# Patient Record
Sex: Male | Born: 1950 | Race: White | Hispanic: No | Marital: Married | Smoking: Never smoker
Health system: Southern US, Community
[De-identification: ages and names within clinical notes are randomized; demographics above are authoritative.]

## PROBLEM LIST (undated history)

## (undated) DIAGNOSIS — C439 Malignant melanoma of skin, unspecified: Secondary | ICD-10-CM

## (undated) DIAGNOSIS — I493 Ventricular premature depolarization: Secondary | ICD-10-CM

## (undated) DIAGNOSIS — I10 Essential (primary) hypertension: Secondary | ICD-10-CM

## (undated) DIAGNOSIS — G4733 Obstructive sleep apnea (adult) (pediatric): Secondary | ICD-10-CM

## (undated) DIAGNOSIS — K219 Gastro-esophageal reflux disease without esophagitis: Secondary | ICD-10-CM

## (undated) DIAGNOSIS — I712 Thoracic aortic aneurysm, without rupture: Secondary | ICD-10-CM

## (undated) DIAGNOSIS — R519 Headache, unspecified: Secondary | ICD-10-CM

## (undated) DIAGNOSIS — K631 Perforation of intestine (nontraumatic): Secondary | ICD-10-CM

## (undated) DIAGNOSIS — C189 Malignant neoplasm of colon, unspecified: Secondary | ICD-10-CM

## (undated) DIAGNOSIS — G4730 Sleep apnea, unspecified: Secondary | ICD-10-CM

## (undated) DIAGNOSIS — R51 Headache: Secondary | ICD-10-CM

## (undated) DIAGNOSIS — Z8489 Family history of other specified conditions: Secondary | ICD-10-CM

## (undated) DIAGNOSIS — H9319 Tinnitus, unspecified ear: Secondary | ICD-10-CM

## (undated) DIAGNOSIS — Z973 Presence of spectacles and contact lenses: Secondary | ICD-10-CM

## (undated) DIAGNOSIS — R011 Cardiac murmur, unspecified: Secondary | ICD-10-CM

## (undated) DIAGNOSIS — I480 Paroxysmal atrial fibrillation: Secondary | ICD-10-CM

## (undated) DIAGNOSIS — Z9989 Dependence on other enabling machines and devices: Secondary | ICD-10-CM

## (undated) DIAGNOSIS — I350 Nonrheumatic aortic (valve) stenosis: Secondary | ICD-10-CM

## (undated) DIAGNOSIS — E785 Hyperlipidemia, unspecified: Secondary | ICD-10-CM

## (undated) HISTORY — DX: Dependence on other enabling machines and devices: Z99.89

## (undated) HISTORY — DX: Thoracic aortic aneurysm, without rupture: I71.2

## (undated) HISTORY — DX: Hyperlipidemia, unspecified: E78.5

## (undated) HISTORY — DX: Ventricular premature depolarization: I49.3

## (undated) HISTORY — PX: MELANOMA EXCISION: SHX5266

## (undated) HISTORY — DX: Sleep apnea, unspecified: G47.30

## (undated) HISTORY — DX: Obstructive sleep apnea (adult) (pediatric): G47.33

---

## 2016-04-25 ENCOUNTER — Emergency Department (HOSPITAL_COMMUNITY): Payer: Medicare Other | Admitting: Anesthesiology

## 2016-04-25 ENCOUNTER — Inpatient Hospital Stay (HOSPITAL_COMMUNITY): Payer: Medicare Other

## 2016-04-25 ENCOUNTER — Inpatient Hospital Stay (HOSPITAL_COMMUNITY)
Admission: EM | Admit: 2016-04-25 | Discharge: 2016-05-03 | DRG: 329 | Disposition: A | Discharge status: Home Health | Payer: Medicare Other | Attending: Surgery | Admitting: Surgery

## 2016-04-25 ENCOUNTER — Emergency Department (HOSPITAL_COMMUNITY): Payer: Medicare Other

## 2016-04-25 ENCOUNTER — Encounter (HOSPITAL_COMMUNITY): Admission: EM | Disposition: A | Discharge status: Home Health | Payer: Self-pay | Source: Home / Self Care

## 2016-04-25 ENCOUNTER — Encounter (HOSPITAL_COMMUNITY): Payer: Self-pay

## 2016-04-25 DIAGNOSIS — R918 Other nonspecific abnormal finding of lung field: Secondary | ICD-10-CM | POA: Diagnosis not present

## 2016-04-25 DIAGNOSIS — K658 Other peritonitis: Secondary | ICD-10-CM | POA: Diagnosis not present

## 2016-04-25 DIAGNOSIS — I10 Essential (primary) hypertension: Secondary | ICD-10-CM | POA: Diagnosis not present

## 2016-04-25 DIAGNOSIS — Z79899 Other long term (current) drug therapy: Secondary | ICD-10-CM | POA: Diagnosis not present

## 2016-04-25 DIAGNOSIS — R198 Other specified symptoms and signs involving the digestive system and abdomen: Secondary | ICD-10-CM | POA: Diagnosis present

## 2016-04-25 DIAGNOSIS — N990 Postprocedural (acute) (chronic) kidney failure: Secondary | ICD-10-CM | POA: Diagnosis not present

## 2016-04-25 DIAGNOSIS — R739 Hyperglycemia, unspecified: Secondary | ICD-10-CM | POA: Diagnosis not present

## 2016-04-25 DIAGNOSIS — K631 Perforation of intestine (nontraumatic): Principal | ICD-10-CM | POA: Diagnosis present

## 2016-04-25 DIAGNOSIS — Z4682 Encounter for fitting and adjustment of non-vascular catheter: Secondary | ICD-10-CM | POA: Diagnosis not present

## 2016-04-25 DIAGNOSIS — Z85820 Personal history of malignant melanoma of skin: Secondary | ICD-10-CM

## 2016-04-25 DIAGNOSIS — R69 Illness, unspecified: Secondary | ICD-10-CM | POA: Diagnosis not present

## 2016-04-25 DIAGNOSIS — K420 Umbilical hernia with obstruction, without gangrene: Secondary | ICD-10-CM

## 2016-04-25 DIAGNOSIS — Z85828 Personal history of other malignant neoplasm of skin: Secondary | ICD-10-CM

## 2016-04-25 DIAGNOSIS — K633 Ulcer of intestine: Secondary | ICD-10-CM

## 2016-04-25 DIAGNOSIS — K429 Umbilical hernia without obstruction or gangrene: Secondary | ICD-10-CM | POA: Diagnosis not present

## 2016-04-25 DIAGNOSIS — Z01818 Encounter for other preprocedural examination: Secondary | ICD-10-CM

## 2016-04-25 DIAGNOSIS — J9601 Acute respiratory failure with hypoxia: Secondary | ICD-10-CM | POA: Diagnosis not present

## 2016-04-25 DIAGNOSIS — K5909 Other constipation: Secondary | ICD-10-CM | POA: Diagnosis present

## 2016-04-25 DIAGNOSIS — K659 Peritonitis, unspecified: Secondary | ICD-10-CM | POA: Diagnosis not present

## 2016-04-25 DIAGNOSIS — K5900 Constipation, unspecified: Secondary | ICD-10-CM | POA: Diagnosis not present

## 2016-04-25 DIAGNOSIS — R069 Unspecified abnormalities of breathing: Secondary | ICD-10-CM

## 2016-04-25 HISTORY — DX: Essential (primary) hypertension: I10

## 2016-04-25 HISTORY — DX: Malignant melanoma of skin, unspecified: C43.9

## 2016-04-25 HISTORY — PX: LAPAROTOMY: SHX154

## 2016-04-25 LAB — BLOOD GAS, ARTERIAL
ACID-BASE DEFICIT: 1.5 mmol/L (ref 0.0–2.0)
BICARBONATE: 23.9 meq/L (ref 20.0–24.0)
Drawn by: 11249
FIO2: 0.4
LHR: 14 {breaths}/min
O2 SAT: 97.6 %
PATIENT TEMPERATURE: 99.6
PCO2 ART: 46 mmHg — AB (ref 35.0–45.0)
PEEP/CPAP: 5 cmH2O
PH ART: 7.339 — AB (ref 7.350–7.450)
PO2 ART: 113 mmHg — AB (ref 80.0–100.0)
TCO2: 20.6 mmol/L (ref 0–100)
VT: 500 mL

## 2016-04-25 LAB — COMPREHENSIVE METABOLIC PANEL
ALT: 30 U/L (ref 17–63)
ANION GAP: 10 (ref 5–15)
AST: 27 U/L (ref 15–41)
Albumin: 4.8 g/dL (ref 3.5–5.0)
Alkaline Phosphatase: 50 U/L (ref 38–126)
BUN: 15 mg/dL (ref 6–20)
CHLORIDE: 99 mmol/L — AB (ref 101–111)
CO2: 26 mmol/L (ref 22–32)
Calcium: 9.2 mg/dL (ref 8.9–10.3)
Creatinine, Ser: 1.1 mg/dL (ref 0.61–1.24)
Glucose, Bld: 129 mg/dL — ABNORMAL HIGH (ref 65–99)
POTASSIUM: 4.1 mmol/L (ref 3.5–5.1)
Sodium: 135 mmol/L (ref 135–145)
Total Bilirubin: 1.4 mg/dL — ABNORMAL HIGH (ref 0.3–1.2)
Total Protein: 7.7 g/dL (ref 6.5–8.1)

## 2016-04-25 LAB — TYPE AND SCREEN
ABO/RH(D): B POS
ANTIBODY SCREEN: NEGATIVE

## 2016-04-25 LAB — PROTIME-INR
INR: 1.16
Prothrombin Time: 14.9 seconds (ref 11.4–15.2)

## 2016-04-25 LAB — ABO/RH: ABO/RH(D): B POS

## 2016-04-25 LAB — CBC
HEMATOCRIT: 48.3 % (ref 39.0–52.0)
HEMOGLOBIN: 16.7 g/dL (ref 13.0–17.0)
MCH: 33 pg (ref 26.0–34.0)
MCHC: 34.6 g/dL (ref 30.0–36.0)
MCV: 95.5 fL (ref 78.0–100.0)
PLATELETS: 168 10*3/uL (ref 150–400)
RBC: 5.06 MIL/uL (ref 4.22–5.81)
RDW: 13.2 % (ref 11.5–15.5)
WBC: 14.5 10*3/uL — AB (ref 4.0–10.5)

## 2016-04-25 LAB — LIPASE, BLOOD: LIPASE: 16 U/L (ref 11–51)

## 2016-04-25 SURGERY — LAPAROTOMY, EXPLORATORY
Anesthesia: General | Site: Abdomen

## 2016-04-25 MED ORDER — FAMOTIDINE IN NACL 20-0.9 MG/50ML-% IV SOLN
20.0000 mg | Freq: Two times a day (BID) | INTRAVENOUS | Status: DC
  Administered 2016-04-26 – 2016-04-30 (×11): 20 mg via INTRAVENOUS
  Filled 2016-04-25 (×12): qty 50

## 2016-04-25 MED ORDER — DEXAMETHASONE SODIUM PHOSPHATE 10 MG/ML IJ SOLN
INTRAMUSCULAR | Status: DC | PRN
  Administered 2016-04-25: 10 mg via INTRAVENOUS

## 2016-04-25 MED ORDER — PROPOFOL 10 MG/ML IV BOLUS
INTRAVENOUS | Status: AC
  Filled 2016-04-25: qty 40

## 2016-04-25 MED ORDER — PIPERACILLIN-TAZOBACTAM 3.375 G IVPB 30 MIN
3.3750 g | INTRAVENOUS | Status: AC
  Administered 2016-04-25: 3.375 g via INTRAVENOUS
  Filled 2016-04-25: qty 50

## 2016-04-25 MED ORDER — CHLORHEXIDINE GLUCONATE CLOTH 2 % EX PADS
6.0000 | MEDICATED_PAD | Freq: Once | CUTANEOUS | Status: AC
  Administered 2016-04-26: 6 via TOPICAL

## 2016-04-25 MED ORDER — FENTANYL CITRATE (PF) 250 MCG/5ML IJ SOLN
INTRAMUSCULAR | Status: AC
  Filled 2016-04-25: qty 5

## 2016-04-25 MED ORDER — SUFENTANIL CITRATE 50 MCG/ML IV SOLN
INTRAVENOUS | Status: AC
  Filled 2016-04-25: qty 1

## 2016-04-25 MED ORDER — HYDROMORPHONE HCL 1 MG/ML IJ SOLN: 0.2500 mg | INTRAMUSCULAR | Status: DC | PRN

## 2016-04-25 MED ORDER — IOPAMIDOL (ISOVUE-300) INJECTION 61%
100.0000 mL | Freq: Once | INTRAVENOUS | Status: AC | PRN
  Administered 2016-04-25: 100 mL via INTRAVENOUS

## 2016-04-25 MED ORDER — SUFENTANIL CITRATE 50 MCG/ML IV SOLN
INTRAVENOUS | Status: DC | PRN
  Administered 2016-04-25 (×2): 15 ug via INTRAVENOUS
  Administered 2016-04-25 (×2): 10 ug via INTRAVENOUS

## 2016-04-25 MED ORDER — PROPOFOL 10 MG/ML IV BOLUS
INTRAVENOUS | Status: DC | PRN
  Administered 2016-04-25: 200 mg via INTRAVENOUS

## 2016-04-25 MED ORDER — SODIUM CHLORIDE 0.9 % IJ SOLN
INTRAMUSCULAR | Status: AC
  Filled 2016-04-25: qty 10

## 2016-04-25 MED ORDER — 0.9 % SODIUM CHLORIDE (POUR BTL) OPTIME
TOPICAL | Status: DC | PRN
  Administered 2016-04-25: 5000 mL

## 2016-04-25 MED ORDER — HYDROMORPHONE HCL 1 MG/ML IJ SOLN
INTRAMUSCULAR | Status: DC | PRN
  Administered 2016-04-25 (×2): 1 mg via INTRAVENOUS

## 2016-04-25 MED ORDER — LIDOCAINE HCL (CARDIAC) 20 MG/ML IV SOLN
INTRAVENOUS | Status: DC | PRN
  Administered 2016-04-25: 25 mg via INTRATRACHEAL
  Administered 2016-04-25: 75 mg via INTRAVENOUS

## 2016-04-25 MED ORDER — HYDROMORPHONE HCL 2 MG/ML IJ SOLN
INTRAMUSCULAR | Status: AC
  Filled 2016-04-25: qty 1

## 2016-04-25 MED ORDER — ONDANSETRON HCL 4 MG/2ML IJ SOLN
INTRAMUSCULAR | Status: DC | PRN
  Administered 2016-04-25: 4 mg via INTRAVENOUS

## 2016-04-25 MED ORDER — SODIUM CHLORIDE 0.9 % IV BOLUS (SEPSIS)
1000.0000 mL | Freq: Once | INTRAVENOUS | Status: AC
  Administered 2016-04-25: 1000 mL via INTRAVENOUS

## 2016-04-25 MED ORDER — ENOXAPARIN SODIUM 40 MG/0.4ML ~~LOC~~ SOLN
40.0000 mg | SUBCUTANEOUS | Status: DC
  Administered 2016-04-26 – 2016-05-02 (×6): 40 mg via SUBCUTANEOUS
  Filled 2016-04-25 (×7): qty 0.4

## 2016-04-25 MED ORDER — ONDANSETRON HCL 4 MG/2ML IJ SOLN: 4.0000 mg | Freq: Four times a day (QID) | INTRAMUSCULAR | Status: DC | PRN

## 2016-04-25 MED ORDER — ONDANSETRON HCL 4 MG/2ML IJ SOLN
INTRAMUSCULAR | Status: AC
  Filled 2016-04-25: qty 2

## 2016-04-25 MED ORDER — EPHEDRINE SULFATE 50 MG/ML IJ SOLN
INTRAMUSCULAR | Status: AC
  Filled 2016-04-25: qty 1

## 2016-04-25 MED ORDER — FENTANYL CITRATE (PF) 100 MCG/2ML IJ SOLN
75.0000 ug | Freq: Once | INTRAMUSCULAR | Status: AC
  Administered 2016-04-25: 75 ug via INTRAVENOUS

## 2016-04-25 MED ORDER — ROCURONIUM BROMIDE 100 MG/10ML IV SOLN
INTRAVENOUS | Status: DC | PRN
  Administered 2016-04-25: 20 mg via INTRAVENOUS
  Administered 2016-04-25 (×2): 30 mg via INTRAVENOUS
  Administered 2016-04-25: 50 mg via INTRAVENOUS

## 2016-04-25 MED ORDER — LIDOCAINE HCL 1 % IJ SOLN
INTRAMUSCULAR | Status: AC
  Filled 2016-04-25: qty 80

## 2016-04-25 MED ORDER — FENTANYL CITRATE (PF) 100 MCG/2ML IJ SOLN
INTRAMUSCULAR | Status: AC
Start: 2016-04-25 — End: 2016-04-26
  Filled 2016-04-25: qty 2

## 2016-04-25 MED ORDER — PIPERACILLIN-TAZOBACTAM 3.375 G IVPB: 3.3750 g | Freq: Three times a day (TID) | INTRAVENOUS | Status: DC

## 2016-04-25 MED ORDER — MIDAZOLAM HCL 5 MG/5ML IJ SOLN
INTRAMUSCULAR | Status: DC | PRN
  Administered 2016-04-25: 2 mg via INTRAVENOUS

## 2016-04-25 MED ORDER — SUCCINYLCHOLINE CHLORIDE 200 MG/10ML IV SOSY
PREFILLED_SYRINGE | INTRAVENOUS | Status: DC | PRN
  Administered 2016-04-25: 100 mg via INTRAVENOUS

## 2016-04-25 MED ORDER — KCL IN DEXTROSE-NACL 20-5-0.45 MEQ/L-%-% IV SOLN
INTRAVENOUS | Status: DC
  Administered 2016-04-26: 125 mL/h via INTRAVENOUS
  Administered 2016-04-26 – 2016-04-29 (×4): via INTRAVENOUS
  Filled 2016-04-25 (×8): qty 1000

## 2016-04-25 MED ORDER — HYDROMORPHONE HCL 1 MG/ML IJ SOLN
1.0000 mg | INTRAMUSCULAR | Status: DC | PRN
  Administered 2016-04-26: 1 mg via INTRAVENOUS
  Filled 2016-04-25: qty 1

## 2016-04-25 MED ORDER — MIDAZOLAM HCL 2 MG/2ML IJ SOLN
INTRAMUSCULAR | Status: AC
  Filled 2016-04-25: qty 2

## 2016-04-25 MED ORDER — LACTATED RINGERS IV SOLN
INTRAVENOUS | Status: DC | PRN
  Administered 2016-04-25 (×3): via INTRAVENOUS

## 2016-04-25 MED ORDER — FENTANYL CITRATE (PF) 100 MCG/2ML IJ SOLN
INTRAMUSCULAR | Status: DC | PRN
  Administered 2016-04-25: 50 ug via INTRAVENOUS
  Administered 2016-04-25: 100 ug via INTRAVENOUS
  Administered 2016-04-25: 50 ug via INTRAVENOUS
  Administered 2016-04-25: 100 ug via INTRAVENOUS
  Administered 2016-04-25: 150 ug via INTRAVENOUS
  Administered 2016-04-25: 50 ug via INTRAVENOUS

## 2016-04-25 MED ORDER — DEXAMETHASONE SODIUM PHOSPHATE 10 MG/ML IJ SOLN
INTRAMUSCULAR | Status: AC
  Filled 2016-04-25: qty 1

## 2016-04-25 MED ORDER — PROMETHAZINE HCL 25 MG/ML IJ SOLN: 6.2500 mg | INTRAMUSCULAR | Status: DC | PRN

## 2016-04-25 MED ORDER — ONDANSETRON 4 MG PO TBDP: 4.0000 mg | ORAL_TABLET | Freq: Four times a day (QID) | ORAL | Status: DC | PRN

## 2016-04-25 MED ORDER — PIPERACILLIN-TAZOBACTAM 3.375 G IVPB
3.3750 g | Freq: Three times a day (TID) | INTRAVENOUS | Status: DC
  Administered 2016-04-26 – 2016-05-01 (×18): 3.375 g via INTRAVENOUS
  Filled 2016-04-25 (×21): qty 50

## 2016-04-25 MED ORDER — FENTANYL CITRATE (PF) 100 MCG/2ML IJ SOLN
75.0000 ug | Freq: Once | INTRAMUSCULAR | Status: AC
  Administered 2016-04-25: 75 ug via INTRAVENOUS
  Filled 2016-04-25: qty 2

## 2016-04-25 MED ORDER — ROCURONIUM BROMIDE 100 MG/10ML IV SOLN
INTRAVENOUS | Status: AC
  Filled 2016-04-25: qty 1

## 2016-04-25 MED ORDER — LIDOCAINE HCL (CARDIAC) 20 MG/ML IV SOLN
INTRAVENOUS | Status: AC
  Filled 2016-04-25: qty 5

## 2016-04-25 SURGICAL SUPPLY — 47 items
APPLICATOR COTTON TIP 6IN STRL (MISCELLANEOUS) ×3 IMPLANT
BARRIER SKIN 2 3/4 (OSTOMY) ×2 IMPLANT
BARRIER SKIN 2 3/4 INCH (OSTOMY) ×1
BLADE EXTENDED COATED 6.5IN (ELECTRODE) IMPLANT
BLADE HEX COATED 2.75 (ELECTRODE) ×3 IMPLANT
CHLORAPREP W/TINT 26ML (MISCELLANEOUS) ×3 IMPLANT
COVER MAYO STAND STRL (DRAPES) IMPLANT
COVER SURGICAL LIGHT HANDLE (MISCELLANEOUS) IMPLANT
DRAPE LAPAROSCOPIC ABDOMINAL (DRAPES) ×3 IMPLANT
DRAPE WARM FLUID 44X44 (DRAPE) IMPLANT
ELECT REM PT RETURN 9FT ADLT (ELECTROSURGICAL) ×3
ELECTRODE REM PT RTRN 9FT ADLT (ELECTROSURGICAL) ×1 IMPLANT
GAUZE SPONGE 4X4 12PLY STRL (GAUZE/BANDAGES/DRESSINGS) ×3 IMPLANT
GLOVE BIOGEL PI IND STRL 7.0 (GLOVE) ×1 IMPLANT
GLOVE BIOGEL PI INDICATOR 7.0 (GLOVE) ×2
GLOVE SURG ORTHO 8.0 STRL STRW (GLOVE) ×6 IMPLANT
GOWN STRL REUS W/TWL LRG LVL3 (GOWN DISPOSABLE) ×3 IMPLANT
GOWN STRL REUS W/TWL XL LVL3 (GOWN DISPOSABLE) ×6 IMPLANT
HANDLE SUCTION POOLE (INSTRUMENTS) ×1 IMPLANT
KIT BASIN OR (CUSTOM PROCEDURE TRAY) ×3 IMPLANT
LIGASURE IMPACT 36 18CM CVD LR (INSTRUMENTS) ×3 IMPLANT
NS IRRIG 1000ML POUR BTL (IV SOLUTION) ×12 IMPLANT
PACK GENERAL/GYN (CUSTOM PROCEDURE TRAY) ×3 IMPLANT
PAD ABD 8X10 STRL (GAUZE/BANDAGES/DRESSINGS) ×3 IMPLANT
RELOAD PROXIMATE 75MM BLUE (ENDOMECHANICALS) ×3 IMPLANT
SPONGE LAP 18X18 X RAY DECT (DISPOSABLE) IMPLANT
STAPLER PROXIMATE 75MM BLUE (STAPLE) ×3 IMPLANT
STAPLER VISISTAT 35W (STAPLE) ×3 IMPLANT
SUCTION POOLE HANDLE (INSTRUMENTS) ×3
SUT NOV 1 T60/GS (SUTURE) IMPLANT
SUT NOVA NAB DX-16 0-1 5-0 T12 (SUTURE) ×12 IMPLANT
SUT PROLENE 2 0 SH DA (SUTURE) ×3 IMPLANT
SUT SILK 2 0 (SUTURE) ×2
SUT SILK 2 0 SH CR/8 (SUTURE) ×6 IMPLANT
SUT SILK 2-0 18XBRD TIE 12 (SUTURE) ×1 IMPLANT
SUT SILK 3 0 (SUTURE) ×2
SUT SILK 3 0 SH CR/8 (SUTURE) ×3 IMPLANT
SUT SILK 3-0 18XBRD TIE 12 (SUTURE) ×1 IMPLANT
SUT VIC AB 3-0 SH 18 (SUTURE) ×3 IMPLANT
SUT VICRYL 2 0 18  UND BR (SUTURE)
SUT VICRYL 2 0 18 UND BR (SUTURE) IMPLANT
TAPE CLOTH SURG 6X10 WHT LF (GAUZE/BANDAGES/DRESSINGS) ×3 IMPLANT
TOWEL OR 17X26 10 PK STRL BLUE (TOWEL DISPOSABLE) ×6 IMPLANT
TRAY FOLEY W/METER SILVER 14FR (SET/KITS/TRAYS/PACK) IMPLANT
TRAY FOLEY W/METER SILVER 16FR (SET/KITS/TRAYS/PACK) ×3 IMPLANT
WATER STERILE IRR 1500ML POUR (IV SOLUTION) IMPLANT
YANKAUER SUCT BULB TIP NO VENT (SUCTIONS) ×3 IMPLANT

## 2016-04-25 NOTE — ED Notes (Signed)
Transport arrived to take pt to surgery. Pt wiped down with Chloraprep, removed belongings and given to family. Consent signed and sent with pt.

## 2016-04-25 NOTE — Transfer of Care (Signed)
Immediate Anesthesia Transfer of Care Note  Patient: [name] Brown  Procedure(s) Performed: Procedure(s): EXPLORATORY LAPAROTOMY WITH SIGMOID COLECTOMY, COLOSTOMY (N/A)  Patient Location: PACU  Anesthesia Type:General  Level of Consciousness: sedated and Patient remains intubated per anesthesia plan  Airway & Oxygen Therapy: Patient placed on Ventilator (see vital sign flow sheet for setting)  Post-op Assessment: Report given to RN and Post -op Vital signs reviewed and stable  Post vital signs: stable  Last Vitals:  Vitals:   04/25/16 1727 04/25/16 2013  BP: 146/85 149/87  Pulse: 95 103  Resp: 14 12  Temp:      Last Pain:  Vitals:   04/25/16 2013  TempSrc:   PainSc: 5          Complications: No apparent anesthesia complications

## 2016-04-25 NOTE — Progress Notes (Signed)
Pharmacy Antibiotic Note  Dreydon Padelford is a 65 y.o. male admitted on 04/25/2016 with abdominal pain. Pharmacy has been consulted for Zosyn dosing. CrCl ~63 ml/min.  No antibiotic allergies noted.    Plan: Zosyn 3.375g IV q8h (infuse over 4 hours) F/u renal function, cultures, clinical course  Height: 5\' 7"  (170.2 cm) Weight: 165 lb (74.8 kg) IBW/kg (Calculated) : 66.1  Temp (24hrs), Avg:97.8 F (36.6 C), Min:97.8 F (36.6 C), Max:97.8 F (36.6 C)   Recent Labs Lab 04/25/16 1459  WBC 14.5*  CREATININE 1.10    Estimated Creatinine Clearance: 62.6 mL/min (by C-G formula based on SCr of 1.1 mg/dL).    No Known Allergies  Antimicrobials this admission: 8/14 Zosyn >>   Dose adjustments this admission:  Microbiology results: None  Thank you for allowing pharmacy to be a part of this patient's care.  Rudean Haskell 04/25/2016 5:28 PM

## 2016-04-25 NOTE — Anesthesia Procedure Notes (Signed)
Procedure Name: Intubation Date/Time: 04/25/2016 8:59 PM Performed by: Lissa Morales Pre-anesthesia Checklist: Patient identified, Emergency Drugs available, Suction available and Patient being monitored Patient Re-evaluated:Patient Re-evaluated prior to inductionOxygen Delivery Method: Circle system utilized Preoxygenation: Pre-oxygenation with 100% oxygen Intubation Type: IV induction Ventilation: Mask ventilation without difficulty Laryngoscope Size: Mac and 4 Grade View: Grade II Tube type: Oral Tube size: 7.0 mm Number of attempts: 1 Airway Equipment and Method: Stylet and Oral airway Placement Confirmation: ETT inserted through vocal cords under direct vision,  positive ETCO2 and breath sounds checked- equal and bilateral Secured at: 22 cm Tube secured with: Tape Dental Injury: Teeth and Oropharynx as per pre-operative assessment

## 2016-04-25 NOTE — ED Notes (Signed)
Patient transported to X-ray 

## 2016-04-25 NOTE — ED Triage Notes (Signed)
Pt c/o generalized abdominal pain x 1 day and constipation.  Pain score 8/10.  Pt has not taken anything for symptoms.  Pt reports severe pain after a large BM yesterday and severe pain while attempting to have a BM today.  Denies GI history.  Denies n/v.

## 2016-04-25 NOTE — ED Notes (Signed)
MD at bedside. 

## 2016-04-25 NOTE — Anesthesia Preprocedure Evaluation (Addendum)
Anesthesia Evaluation  Patient identified by MRN, date of birth, ID band Patient awake    Reviewed: Allergy & Precautions, NPO status , Patient's Chart, lab work & pertinent test resultsPreop documentation limited or incomplete due to emergent nature of procedure.  Airway Mallampati: II  TM Distance: >3 FB Neck ROM: Full    Dental   Pulmonary neg pulmonary ROS,    breath sounds clear to auscultation       Cardiovascular hypertension,  Rhythm:Regular Rate:Normal     Neuro/Psych    GI/Hepatic Neg liver ROS, History noted. CE   Endo/Other  negative endocrine ROS  Renal/GU negative Renal ROS     Musculoskeletal   Abdominal   Peds  Hematology   Anesthesia Other Findings   Reproductive/Obstetrics                             Anesthesia Physical Anesthesia Plan  ASA: III and emergent  Anesthesia Plan: General   Post-op Pain Management:    Induction: Intravenous, Rapid sequence and Cricoid pressure planned  Airway Management Planned: Oral ETT  Additional Equipment:   Intra-op Plan:   Post-operative Plan: Possible Post-op intubation/ventilation  Informed Consent: I have reviewed the patients History and Physical, chart, labs and discussed the procedure including the risks, benefits and alternatives for the proposed anesthesia with the patient or authorized representative who has indicated his/her understanding and acceptance.   Dental advisory given  Plan Discussed with: CRNA, Anesthesiologist and Surgeon  Anesthesia Plan Comments:         Anesthesia Quick Evaluation

## 2016-04-25 NOTE — ED Notes (Signed)
Patient transported to CT 

## 2016-04-25 NOTE — H&P (Signed)
[name] Brown is an 65 y.o. male.    General Surgery [facility]  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  Denies fever, chills, nausea, emesis, or BRBPR.  No hx of diverticular disease.  No prior abdominal surgery.  Long-standing umbilcal hernia.  Never had colonoscopy.  Attempted to have BM today without success but with recurrence of pain.  CT shows extensive free air in the upper and lower abdomen.  No clear source of perforation.  No fluid.  Past Medical History:  Diagnosis Date  . Hypertension   . Melanoma [facility])     Past Surgical History:  Procedure Laterality Date  . MELANOMA EXCISION      History reviewed. No pertinent family history. Social History:  reports that he has never smoked. He has never used smokeless tobacco. He reports that he drinks alcohol. He reports that he does not use drugs.  Allergies: No Known Allergies   (Not in a hospital admission)  Results for orders placed or performed during the hospital encounter of 04/25/16 (from the past 48 hour(s))  Lipase, blood     Status: None   Collection Time: 04/25/16  2:59 PM  Result Value Ref Range   Lipase 16 11 - 51 U/L  Comprehensive metabolic panel     Status: Abnormal   Collection Time: 04/25/16  2:59 PM  Result Value Ref Range   Sodium 135 135 - 145 mmol/L   Potassium 4.1 3.5 - 5.1 mmol/L   Chloride 99 (L) 101 - 111 mmol/L   CO2 26 22 - 32 mmol/L   Glucose, Bld 129 (H) 65 - 99 mg/dL   BUN 15 6 - 20 mg/dL   Creatinine, Ser 1.10 0.61 - 1.24 mg/dL   Calcium 9.2 8.9 - 10.3 mg/dL   Total Protein 7.7 6.5 - 8.1 g/dL   Albumin 4.8 3.5 - 5.0 g/dL   AST 27 15 - 41 U/L   ALT 30 17 - 63 U/L   Alkaline Phosphatase 50 38 - 126 U/L   Total Bilirubin 1.4 (H) 0.3 - 1.2 mg/dL   GFR calc non Af Amer >60 >60 mL/min   GFR calc Af Amer >60  >60 mL/min    Comment: (NOTE) The eGFR has been calculated using the CKD EPI equation. This calculation has not been validated in all clinical situations. eGFR's persistently <60 mL/min signify possible Chronic Kidney Disease.    Anion gap 10 5 - 15  CBC     Status: Abnormal   Collection Time: 04/25/16  2:59 PM  Result Value Ref Range   WBC 14.5 (H) 4.0 - 10.5 K/uL   RBC 5.06 4.22 - 5.81 MIL/uL   Hemoglobin 16.7 13.0 - 17.0 g/dL   HCT 48.3 39.0 - 52.0 %   MCV 95.5 78.0 - 100.0 fL   MCH 33.0 26.0 - 34.0 pg   MCHC 34.6 30.0 - 36.0 g/dL   RDW 13.2 11.5 - 15.5 %   Platelets 168 150 - 400 K/uL  Protime-INR     Status: None   Collection Time: 04/25/16  6:12 PM  Result Value Ref Range   Prothrombin Time 14.9 11.4 - 15.2 seconds   INR 1.16   Type and screen [place]     Status: None   Collection Time: 04/25/16  6:12 PM  Result  Value Ref Range   ABO/RH(D) B POS    Antibody Screen NEG    Sample Expiration 04/28/2016    Dg Chest 2 View  Result Date: 04/25/2016 CLINICAL DATA:  Pneumoperitoneum.  Preoperative chest x-ray. EXAM: CHEST  2 VIEW COMPARISON:  CT abdomen and pelvis from the same day. FINDINGS: The heart size is normal. Pneumoperitoneum is present. Bibasilar airspace opacities are again noted. The upper lung fields are clear. The visualized soft tissues and bony thorax are unremarkable. IMPRESSION: 1. Pneumoperitoneum. 2. Bibasilar airspace opacities are similar to the for are studied. These likely reflect atelectasis associated with diaphragmatic irritation. Electronically Signed   By: San Morelle M.D.   On: 04/25/2016 19:14   Ct Abdomen Pelvis W Contrast  Result Date: 04/25/2016 CLINICAL DATA:  Generalized abdominal pain for 1 day. Constipation. Severe pain after large bowel movement yesterday and severe pain while attempting to have bowel movement today. EXAM: CT ABDOMEN AND PELVIS WITH CONTRAST TECHNIQUE: Multidetector CT imaging of the abdomen  and pelvis was performed using the standard protocol following bolus administration of intravenous contrast. CONTRAST:  172m ISOVUE-300 IOPAMIDOL (ISOVUE-300) INJECTION 61% COMPARISON:  None. FINDINGS: Lower chest: Bibasilar airspace disease likely reflects atelectasis. There is some consolidation on the left which could represent early infection or aspiration. The heart size is normal. Calcifications are present at the aortic valves. Hepatobiliary: No focal hepatic lesions are present. Liver contour is within normal limits. Common bile duct is normal. Small layering stones are present at the neck of the gallbladder without evidence for inflammation. Pancreas: No significant inflammatory changes are present. No focal solid or cystic mass lesion is present. The pancreatic duct is within normal limits. Spleen: Unremarkable Adrenals/Urinary Tract: The adrenal glands are normal bilaterally. Kidneys and ureters are within normal limits. Urinary bladder is mostly collapsed. Stomach/Bowel: Free air a is layering within the abdomen extends into an umbilical hernia. There is diffuse free air within the colonic mesenteries. A small hiatal hernia is present. The stomach and duodenum are otherwise within normal limits. The proximal small bowel is unremarkable. Fluid filled loops of bowel in the mid abdomen are noted. The more distal small bowel is collapsed. There is some wall thickening of the terminal ileum at the ileocecal valve. Inflammatory changes surround the ascending colon. No discrete mass lesion is present. Moderate stool is present throughout the colon to the level of a redundant sigmoid. The distal sigmoid and rectum are decompressed. Vascular/Lymphatic: Atherosclerotic calcifications are present in the distal abdominal aorta and branch vessels. No significant adenopathy is present. Reproductive: Within normal limits Other: Extensive free air is present. There is no significant free fluid. Musculoskeletal: Bone  windows demonstrate mild rightward curvature at the thoracolumbar lumbar junction. A remote superior endplate fractures present at L5. No other focal osseous lesions are present. The pelvis is intact. IMPRESSION: 1. Pneumoperitoneum. 2. Moderate stool throughout the colon involving a a redundant sigmoid without focal obstruction or mass. 3. Mild inflammatory changes at the level the cecum and ileocecal valve without focal obstruction or mass. 4. The distal sigmoid and rectum are collapsed. 5. Atherosclerosis. 6. Bibasilar airspace disease is slightly greater than expected for atelectasis. Infection or aspiration is not excluded, particularly at the left base. These results were called by telephone at the time of interpretation on 04/25/2016 at 5:22 pm to Dr. PAddison Lank, who verbally acknowledged these results. Electronically Signed   By: CSan MorelleM.D.   On: 04/25/2016 17:22    Review of Systems  Constitutional: Negative for chills, diaphoresis, fever, malaise/fatigue and weight loss.  HENT: Negative.   Eyes: Negative.   Respiratory: Negative.   Cardiovascular: Negative.   Gastrointestinal: Positive for abdominal pain (diffuse, max LLQ) and constipation. Negative for blood in stool, diarrhea, nausea and vomiting.  Genitourinary: Negative.   Musculoskeletal: Negative.   Skin: Negative.   Neurological: Negative.  Negative for weakness.  Endo/Heme/Allergies: Negative.   Psychiatric/Behavioral: Negative.     Blood pressure 146/85, pulse 95, temperature 97.8 F (36.6 C), temperature source Oral, resp. rate 14, height '5\' 7"'  (1.702 m), weight 74.8 kg (165 lb), SpO2 100 %. Physical Exam  Constitutional: He is oriented to person, place, and time. He appears well-developed and well-nourished. No distress.  HENT:  Head: Normocephalic and atraumatic.  Right Ear: External ear normal.  Left Ear: External ear normal.  Mouth/Throat: No oropharyngeal exudate.  Eyes: Conjunctivae are normal.  Pupils are equal, round, and reactive to light. No scleral icterus.  Neck: Normal range of motion. Neck supple. No tracheal deviation present. No thyromegaly present.  Cardiovascular: Normal rate, regular rhythm and normal heart sounds.   No murmur heard. Respiratory: Effort normal and breath sounds normal. No respiratory distress. He has no wheezes.  GI: Soft. He exhibits no distension and no mass. There is tenderness (diffuse). There is rebound and guarding.  Few BS present; umbilical hernia, soft, tender  Musculoskeletal: Normal range of motion. He exhibits no edema or tenderness.  Neurological: He is alert and oriented to person, place, and time.  Skin: Skin is warm and dry. He is not diaphoretic.  Psychiatric: He has a normal mood and affect. His behavior is normal.     Assessment/Plan Perforated viscus - suspect colonic perforation  Zosyn started by ER MD  Plan urgent exploratory laparotomy - possible bowel resection, colostomy, open wound - discussed with patient and family at bedside  OR notified  The risks and benefits of the procedure have been discussed at length with the patient.  The patient understands the proposed procedure, potential alternative treatments, and the course of recovery to be expected.  All of the patient's questions have been answered at this time.  The patient wishes to proceed with surgery.  Earnstine Regal, MD, [facility] Office: [place], [place] 04/25/2016, 7:58 PM

## 2016-04-25 NOTE — ED Notes (Signed)
Requested urine sample from pt but she was not able to provide one at this time. Urinal provided.

## 2016-04-25 NOTE — ED Provider Notes (Signed)
[facility] DEPT Provider Note   CSN: [id number] Arrival date & time: 04/25/16  1413     History   Chief Complaint Chief Complaint  Patient presents with  . Abdominal Pain  . Constipation    HPI [name] Brown is a 65 y.o. male.  The history is provided by the patient.  Abdominal Pain   This is a new problem. The current episode started yesterday. The problem occurs constantly. The problem has not changed since onset.Associated with: following a large BM yesterday. tried to have another BM today and pain became severe and stopped tried. The pain is located in the LLQ, LUQ and periumbilical region. The quality of the pain is aching. The pain is moderate. Associated symptoms include constipation. Pertinent negatives include fever, belching, diarrhea, hematochezia, melena, nausea, vomiting, frequency and headaches. Exacerbated by: BM; moving. Relieved by: lying still.  Constipation   Associated symptoms include abdominal pain.    Past Medical History:  Diagnosis Date  . Hypertension   . Melanoma ([place])     There are no active problems to display for this patient.   Past Surgical History:  Procedure Laterality Date  . MELANOMA EXCISION         Home Medications    Prior to Admission medications   Medication Sig Start Date End Date Taking? Authorizing Provider  ibuprofen (ADVIL,MOTRIN) 200 MG tablet Take 200 mg by mouth every 6 (six) hours as needed for headache, mild pain or moderate pain.   Yes Historical Provider, MD    Family History History reviewed. No pertinent family history.  Social History Social History  Substance Use Topics  . Smoking status: Never Smoker  . Smokeless tobacco: Never Used  . Alcohol use Yes     Allergies   Review of patient's allergies indicates no known allergies.   Review of Systems Review of Systems  Constitutional: Negative for appetite change, chills, fatigue and fever.  HENT: Negative for congestion, ear pain, facial  swelling, mouth sores and sore throat.   Eyes: Negative for visual disturbance.  Respiratory: Negative for cough, chest tightness and shortness of breath.   Cardiovascular: Negative for chest pain and palpitations.  Gastrointestinal: Positive for abdominal pain and constipation. Negative for blood in stool, diarrhea, hematochezia, melena, nausea and vomiting.  Endocrine: Negative for cold intolerance and heat intolerance.  Genitourinary: Negative for decreased urine volume, difficulty urinating and frequency.  Musculoskeletal: Negative for back pain and neck stiffness.  Skin: Negative for rash.  Neurological: Negative for dizziness, weakness, light-headedness and headaches.  All other systems reviewed and are negative.    Physical Exam Updated Vital Signs BP 127/85 (BP Location: Right Arm)   Pulse 93   Temp 97.8 F (36.6 C) (Oral)   Resp 14   Ht _0  (1.702 m)   Wt 165 lb (74.8 kg)   SpO2 98%   BMI 25.84 kg/m   Physical Exam  Constitutional: He is oriented to person, place, and time. He appears well-nourished. No distress.  HENT:  Head: Normocephalic and atraumatic.  Right Ear: External ear normal.  Left Ear: External ear normal.  Eyes: Pupils are equal, round, and reactive to light. Right eye exhibits no discharge. Left eye exhibits no discharge. No scleral icterus.  Neck: Normal range of motion. Neck supple.  Cardiovascular: Normal rate.  Exam reveals no gallop and no friction rub.   No murmur heard. Pulmonary/Chest: Effort normal and breath sounds normal. No stridor. No respiratory distress. He has no wheezes. He has no  rales. He exhibits no tenderness.  Abdominal: Soft. He exhibits no distension and no mass. There is generalized tenderness (worse in the LLQ). There is no rigidity, no rebound, no guarding, no CVA tenderness, no tenderness at McBurney's point and negative Murphy's sign.  Musculoskeletal: He exhibits no edema or tenderness.  Neurological: He is alert and  oriented to person, place, and time.  Skin: Skin is warm and dry. No rash noted. He is not diaphoretic. No erythema.     ED Treatments / Results  Labs (all labs ordered are listed, but only abnormal results are displayed) Labs Reviewed  COMPREHENSIVE METABOLIC PANEL - Abnormal; Notable for the following:       Result Value   Chloride 99 (*)    Glucose, Bld 129 (*)    Total Bilirubin 1.4 (*)    All other components within normal limits  CBC - Abnormal; Notable for the following:    WBC 14.5 (*)    All other components within normal limits  LIPASE, BLOOD  URINALYSIS, ROUTINE W REFLEX MICROSCOPIC (NOT AT [facility])    EKG  EKG Interpretation  Date/Time:  Monday April 25 2016 17:53:37 EDT Ventricular Rate:  103 PR Interval:    QRS Duration: 88 QT Interval:  372 QTC Calculation: 487 R Axis:   49 Text Interpretation:  Sinus tachycardia Probable left atrial enlargement Anterior infarct, old Baseline wander in lead(s) V3 no prior tracing to compare Confirmed by [name] [phone]) on 04/26/2016 10:57:18 AM       Radiology Dg Chest 2 View  Result Date: 04/25/2016 CLINICAL DATA:  Pneumoperitoneum.  Preoperative chest x-ray. EXAM: CHEST  2 VIEW COMPARISON:  CT abdomen and pelvis from the same day. FINDINGS: The heart size is normal. Pneumoperitoneum is present. Bibasilar airspace opacities are again noted. The upper lung fields are clear. The visualized soft tissues and bony thorax are unremarkable. IMPRESSION: 1. Pneumoperitoneum. 2. Bibasilar airspace opacities are similar to the for are studied. These likely reflect atelectasis associated with diaphragmatic irritation. Electronically Signed   By: San Morelle M.D.   On: 04/25/2016 19:14   Ct Abdomen Pelvis W Contrast  Result Date: 04/25/2016 CLINICAL DATA:  Generalized abdominal pain for 1 day. Constipation. Severe pain after large bowel movement yesterday and severe pain while attempting to have bowel movement today.  EXAM: CT ABDOMEN AND PELVIS WITH CONTRAST TECHNIQUE: Multidetector CT imaging of the abdomen and pelvis was performed using the standard protocol following bolus administration of intravenous contrast. CONTRAST:  112m ISOVUE-300 IOPAMIDOL (ISOVUE-300) INJECTION 61% COMPARISON:  None. FINDINGS: Lower chest: Bibasilar airspace disease likely reflects atelectasis. There is some consolidation on the left which could represent early infection or aspiration. The heart size is normal. Calcifications are present at the aortic valves. Hepatobiliary: No focal hepatic lesions are present. Liver contour is within normal limits. Common bile duct is normal. Small layering stones are present at the neck of the gallbladder without evidence for inflammation. Pancreas: No significant inflammatory changes are present. No focal solid or cystic mass lesion is present. The pancreatic duct is within normal limits. Spleen: Unremarkable Adrenals/Urinary Tract: The adrenal glands are normal bilaterally. Kidneys and ureters are within normal limits. Urinary bladder is mostly collapsed. Stomach/Bowel: Free air a is layering within the abdomen extends into an umbilical hernia. There is diffuse free air within the colonic mesenteries. A small hiatal hernia is present. The stomach and duodenum are otherwise within normal limits. The proximal small bowel is unremarkable. Fluid filled loops of bowel  in the mid abdomen are noted. The more distal small bowel is collapsed. There is some wall thickening of the terminal ileum at the ileocecal valve. Inflammatory changes surround the ascending colon. No discrete mass lesion is present. Moderate stool is present throughout the colon to the level of a redundant sigmoid. The distal sigmoid and rectum are decompressed. Vascular/Lymphatic: Atherosclerotic calcifications are present in the distal abdominal aorta and branch vessels. No significant adenopathy is present. Reproductive: Within normal limits  Other: Extensive free air is present. There is no significant free fluid. Musculoskeletal: Bone windows demonstrate mild rightward curvature at the thoracolumbar lumbar junction. A remote superior endplate fractures present at L5. No other focal osseous lesions are present. The pelvis is intact. IMPRESSION: 1. Pneumoperitoneum. 2. Moderate stool throughout the colon involving a a redundant sigmoid without focal obstruction or mass. 3. Mild inflammatory changes at the level the cecum and ileocecal valve without focal obstruction or mass. 4. The distal sigmoid and rectum are collapsed. 5. Atherosclerosis. 6. Bibasilar airspace disease is slightly greater than expected for atelectasis. Infection or aspiration is not excluded, particularly at the left base. These results were called by telephone at the time of interpretation on 04/25/2016 at 5:22 pm to Dr. Addison Lank , who verbally acknowledged these results. Electronically Signed   By: San Morelle M.D.   On: 04/25/2016 17:22   Dg Chest Port 1 View  Result Date: 04/25/2016 CLINICAL DATA:  Postoperative.  Endotracheal tube placement. EXAM: PORTABLE CHEST 1 VIEW COMPARISON:  04/25/2016 FINDINGS: Interval placement of an endotracheal tube with tip measuring 6.1 cm above the carina. Heart size and pulmonary vascularity are normal for technique. Shallow inspiration with linear atelectasis in the lung bases. Tortuous aorta. No pneumothorax. IMPRESSION: Endotracheal tube tip measures 6.9 cm above the carina. Electronically Signed   By: Lucienne Capers M.D.   On: 04/25/2016 23:36    Procedures Procedures (including critical care time)  Medications Ordered in ED Medications  dextrose 5 % and 0.45 % NaCl with KCl 20 mEq/L infusion ( Intravenous New Bag/Given 04/26/16 0819)  enoxaparin (LOVENOX) injection 40 mg (not administered)  piperacillin-tazobactam (ZOSYN) IVPB 3.375 g (3.375 g Intravenous Given 04/26/16 0237)  ondansetron (ZOFRAN-ODT) disintegrating  tablet 4 mg (not administered)    Or  ondansetron (ZOFRAN) injection 4 mg (not administered)  famotidine (PEPCID) IVPB 20 mg premix (20 mg Intravenous Given 04/26/16 1018)  fentaNYL (SUBLIMAZE) injection 50 mcg (not administered)  fentaNYL (SUBLIMAZE) injection 50 mcg (50 mcg Intravenous Given 04/26/16 1007)  propofol (DIPRIVAN) 1000 MG/100ML infusion (0 mcg/kg/min  74.8 kg Intravenous Stopped 04/26/16 1015)  anidulafungin (ERAXIS) 100 mg in sodium chloride 0.9 % 100 mL IVPB (not administered)  labetalol (NORMODYNE,TRANDATE) injection 10 mg (not administered)  chlorhexidine gluconate (SAGE KIT) (PERIDEX) 0.12 % solution 15 mL (15 mLs Mouth Rinse Given 04/26/16 1019)  antiseptic oral rinse solution (CORINZ) (not administered)  fentaNYL (SUBLIMAZE) injection 75 mcg (75 mcg Intravenous Given 04/25/16 1615)  sodium chloride 0.9 % bolus 1,000 mL (0 mLs Intravenous Stopped 04/25/16 1850)  iopamidol (ISOVUE-300) 61 % injection 100 mL (100 mLs Intravenous Contrast Given 04/25/16 1643)  fentaNYL (SUBLIMAZE) injection 75 mcg (75 mcg Intravenous Not Given 04/26/16 0207)  piperacillin-tazobactam (ZOSYN) IVPB 3.375 g (0 g Intravenous Stopped 04/25/16 1816)  fentaNYL (SUBLIMAZE) injection 75 mcg (75 mcg Intravenous Given 04/25/16 2009)  Chlorhexidine Gluconate Cloth 2 % PADS 6 each (6 each Topical Given by Other 04/26/16 0057)    And  Chlorhexidine Gluconate Cloth 2 % PADS  6 each (6 each Topical Given by Other 04/26/16 0058)  magnesium sulfate IVPB 2 g 50 mL (2 g Intravenous Given 04/26/16 0237)  anidulafungin (ERAXIS) 200 mg in sodium chloride 0.9 % 200 mL IVPB (200 mg Intravenous Given 04/26/16 0317)     Initial Impression / Assessment and Plan / ED Course  I have reviewed the triage vital signs and the nursing notes.  Pertinent labs & imaging results that were available during my care of the patient were reviewed by me and considered in my medical decision making (see chart for details).  Clinical Course     Work up including labs and imaging to rule out appendicitis, diverticulitis, SBO, perforated bowel. No GU sx.   Given IVF and pain meds while awaiting w/u.  Workup with evidence of perforated bowel. Pt made aware of findings and plan. Patient given additional IV fluids and pain medicines. Patient also given Zosyn.   Surgery consulted who will take the patient to the OR for repair. Additional pre-op labs, EKG, and CXR obtained.   CRITICAL CARE Performed by: Grayce Sessions Kenzey Birkland Total critical care time: 30 minutes Critical care time was exclusive of separately billable procedures and treating other patients. Critical care was necessary to treat or prevent imminent or life-threatening deterioration. Critical care was time spent personally by me on the following activities: development of treatment plan with patient and/or surrogate as well as nursing, discussions with consultants, evaluation of patient's response to treatment, examination of patient, obtaining history from patient or surrogate, ordering and performing treatments and interventions, ordering and review of laboratory studies, ordering and review of radiographic studies, pulse oximetry and re-evaluation of patient's condition.    Final Clinical Impressions(s) / ED Diagnoses   Final diagnoses:  Pre-op evaluation  Bowel perforation ([place])     Disposition: Admit  Condition: Stable    Fatima Blank, MD 04/26/16 1102

## 2016-04-25 NOTE — Progress Notes (Signed)
Pt confirms no pcp pt with medicare  cm provided pt with a 6 page list of medicare providers within his zipcode [phone]

## 2016-04-25 NOTE — ED Notes (Signed)
Surgeon at bedside.  

## 2016-04-25 NOTE — Brief Op Note (Signed)
04/25/2016  10:48 PM  PATIENT:  [name] Brown  65 y.o. male  PRE-OPERATIVE DIAGNOSIS:  perforated viscus  POST-OPERATIVE DIAGNOSIS:  perforated distal sigmoid colon  PROCEDURE:  Procedure(s): EXPLORATORY LAPAROTOMY WITH SIGMOID COLECTOMY, COLOSTOMY (N/A)  SURGEON:  Surgeon(s) and Role:    * Armandina Gemma, MD - Primary  ANESTHESIA:   general  EBL:  Total I/O In: 2000 [I.V.:2000] Out: 200 [Urine:100; Blood:100]  BLOOD ADMINISTERED:none  DRAINS: none   LOCAL MEDICATIONS USED:  NONE  SPECIMEN:  Excision  DISPOSITION OF SPECIMEN:  PATHOLOGY  COUNTS:  YES  TOURNIQUET:  * No tourniquets in log *  DICTATION: .Other Dictation: Dictation Number [id number]  PLAN OF CARE: Admit to inpatient   PATIENT DISPOSITION:  ICU - intubated and hemodynamically stable.   Delay start of Pharmacological VTE agent (>24hrs) due to surgical blood loss or risk of bleeding: yes  Earnstine Regal, MD, [facility] Office: [phone]

## 2016-04-25 NOTE — Anesthesia Postprocedure Evaluation (Signed)
Anesthesia Post Note  Patient: [name] Brown  Procedure(s) Performed: Procedure(s) (LRB): EXPLORATORY LAPAROTOMY WITH SIGMOID COLECTOMY, COLOSTOMY (N/A)  Patient location during evaluation: ICU Anesthesia Type: General Level of consciousness: patient remains intubated per anesthesia plan Pain management: pain level controlled Vital Signs Assessment: post-procedure vital signs reviewed and stable Respiratory status: patient remains intubated per anesthesia plan Cardiovascular status: stable Anesthetic complications: no    Last Vitals:  Vitals:   04/25/16 1727 04/25/16 2013  BP: 146/85 149/87  Pulse: 95 103  Resp: 14 12  Temp:      Last Pain:  Vitals:   04/25/16 2013  TempSrc:   PainSc: 5                  EDWARDS,[name] Sitts

## 2016-04-26 ENCOUNTER — Encounter (HOSPITAL_COMMUNITY): Payer: Self-pay | Admitting: Surgery

## 2016-04-26 DIAGNOSIS — K631 Perforation of intestine (nontraumatic): Principal | ICD-10-CM

## 2016-04-26 DIAGNOSIS — J9601 Acute respiratory failure with hypoxia: Secondary | ICD-10-CM

## 2016-04-26 DIAGNOSIS — R69 Illness, unspecified: Secondary | ICD-10-CM

## 2016-04-26 LAB — BASIC METABOLIC PANEL
Anion gap: 6 (ref 5–15)
BUN: 12 mg/dL (ref 6–20)
CALCIUM: 8.2 mg/dL — AB (ref 8.9–10.3)
CHLORIDE: 105 mmol/L (ref 101–111)
CO2: 24 mmol/L (ref 22–32)
CREATININE: 0.89 mg/dL (ref 0.61–1.24)
Glucose, Bld: 146 mg/dL — ABNORMAL HIGH (ref 65–99)
Potassium: 4.3 mmol/L (ref 3.5–5.1)
SODIUM: 135 mmol/L (ref 135–145)

## 2016-04-26 LAB — BLOOD GAS, ARTERIAL
Acid-base deficit: 1.6 mmol/L (ref 0.0–2.0)
BICARBONATE: 23.2 meq/L (ref 20.0–24.0)
DRAWN BY: 11249
FIO2: 0.4
MECHVT: 530 mL
O2 SAT: 97.8 %
PATIENT TEMPERATURE: 99.5
PCO2 ART: 42.7 mmHg (ref 35.0–45.0)
PEEP/CPAP: 5 cmH2O
PH ART: 7.357 (ref 7.350–7.450)
RATE: 16 resp/min
TCO2: 20.1 mmol/L (ref 0–100)
pO2, Arterial: 117 mmHg — ABNORMAL HIGH (ref 80.0–100.0)

## 2016-04-26 LAB — CBC
HCT: 48.3 % (ref 39.0–52.0)
Hemoglobin: 16.5 g/dL (ref 13.0–17.0)
MCH: 32.7 pg (ref 26.0–34.0)
MCHC: 34.2 g/dL (ref 30.0–36.0)
MCV: 95.8 fL (ref 78.0–100.0)
PLATELETS: 176 10*3/uL (ref 150–400)
RBC: 5.04 MIL/uL (ref 4.22–5.81)
RDW: 13.5 % (ref 11.5–15.5)
WBC: 11.8 10*3/uL — AB (ref 4.0–10.5)

## 2016-04-26 LAB — GLUCOSE, CAPILLARY: GLUCOSE-CAPILLARY: 134 mg/dL — AB (ref 65–99)

## 2016-04-26 LAB — MRSA PCR SCREENING: MRSA BY PCR: NEGATIVE

## 2016-04-26 LAB — TRIGLYCERIDES: TRIGLYCERIDES: 36 mg/dL (ref <150)

## 2016-04-26 LAB — MAGNESIUM: MAGNESIUM: 1.6 mg/dL — AB (ref 1.7–2.4)

## 2016-04-26 MED ORDER — FENTANYL CITRATE (PF) 100 MCG/2ML IJ SOLN
50.0000 ug | INTRAMUSCULAR | Status: DC | PRN
  Administered 2016-04-26 (×4): 50 ug via INTRAVENOUS
  Filled 2016-04-26 (×2): qty 2

## 2016-04-26 MED ORDER — FENTANYL CITRATE (PF) 100 MCG/2ML IJ SOLN
50.0000 ug | INTRAMUSCULAR | Status: DC | PRN
  Administered 2016-04-26: 50 ug via INTRAVENOUS
  Filled 2016-04-26 (×3): qty 2

## 2016-04-26 MED ORDER — MAGNESIUM SULFATE 2 GM/50ML IV SOLN
2.0000 g | Freq: Once | INTRAVENOUS | Status: AC
  Administered 2016-04-26: 2 g via INTRAVENOUS
  Filled 2016-04-26: qty 50

## 2016-04-26 MED ORDER — SODIUM CHLORIDE 0.9 % IV SOLN
200.0000 mg | Freq: Once | INTRAVENOUS | Status: AC
  Administered 2016-04-26: 200 mg via INTRAVENOUS
  Filled 2016-04-26: qty 200

## 2016-04-26 MED ORDER — PROPOFOL 1000 MG/100ML IV EMUL
0.0000 ug/kg/min | INTRAVENOUS | Status: DC
  Administered 2016-04-26: 5 ug/kg/min via INTRAVENOUS
  Filled 2016-04-26: qty 100

## 2016-04-26 MED ORDER — ANTISEPTIC ORAL RINSE SOLUTION (CORINZ): 7.0000 mL | Freq: Four times a day (QID) | OROMUCOSAL | Status: DC

## 2016-04-26 MED ORDER — CHLORHEXIDINE GLUCONATE 0.12% ORAL RINSE (MEDLINE KIT)
15.0000 mL | Freq: Two times a day (BID) | OROMUCOSAL | Status: DC
  Administered 2016-04-26: 15 mL via OROMUCOSAL

## 2016-04-26 MED ORDER — LABETALOL HCL 5 MG/ML IV SOLN
10.0000 mg | INTRAVENOUS | Status: DC | PRN
  Filled 2016-04-26: qty 4

## 2016-04-26 MED ORDER — FENTANYL CITRATE (PF) 100 MCG/2ML IJ SOLN
12.5000 ug | INTRAMUSCULAR | Status: DC | PRN
  Administered 2016-04-26 – 2016-04-27 (×8): 25 ug via INTRAVENOUS
  Filled 2016-04-26 (×7): qty 2

## 2016-04-26 MED ORDER — CETYLPYRIDINIUM CHLORIDE 0.05 % MT LIQD
7.0000 mL | Freq: Two times a day (BID) | OROMUCOSAL | Status: DC
  Administered 2016-04-26 – 2016-05-02 (×7): 7 mL via OROMUCOSAL

## 2016-04-26 MED ORDER — SODIUM CHLORIDE 0.9 % IV SOLN
100.0000 mg | INTRAVENOUS | Status: AC
  Administered 2016-04-27 – 2016-04-30 (×4): 100 mg via INTRAVENOUS
  Filled 2016-04-26 (×5): qty 100

## 2016-04-26 NOTE — Progress Notes (Signed)
eLink Physician-Brief Progress Note Patient Name: [name] Brown DOB: 1951-07-31 MRN: [id number]   Date of Service  04/26/2016  HPI/Events of Note  65 yo male. Sigmoid tear with perforation. Now s/p ex lap, colectomy and colostomy. Remains on mechanical ventilation post op. Bedside nurse states that he is having frequent ectopy. Review of CXR reveals ETT to be 6.1 cm above the carina. ABG on 40%/PRVC 14/TV 530/P 5 = 7.33/46/113  eICU Interventions  Will order: 1. Ventilator orders: 40%/PRVC 16/TV 530/P 5. 2. ABG at 5 AM. 3. Advance ETT 2 cm and re-secure.  4. Send AM BMP now. 5. Mg++ level now.      Intervention Category Evaluation Type: New Patient Evaluation  Lysle Dingwall 04/26/2016, 12:11 AM

## 2016-04-26 NOTE — Progress Notes (Signed)
Increased RR 20 for pt synchrony with the vent. Decrease FIO2 30 % due to stable sats

## 2016-04-26 NOTE — Progress Notes (Signed)
Pharmacy Antibiotic Note  [name] Brown is a 65 y.o. male admitted on 04/25/2016 with sigmoid tear with perforation.   Patient s/p exp lap with sigmoid colectomy, colostomy.  Remains intubated post-op. Zosyn was started earlier in the ED. Pharmacy has been consulted for Zosyn & Eraxis dosing.  Plan:  Continue Zosyn 3.375gm IV q8h (each dose infused over 4 hrs)  Eraxis 200mg  IV x 1 followed by 100mg  IV q24h  Height: 5\' 7"  (170.2 cm) Weight: 165 lb (74.8 kg) IBW/kg (Calculated) : 66.1  Temp (24hrs), Avg:98.7 F (37.1 C), Min:97.8 F (36.6 C), Max:99.6 F (37.6 C)   Recent Labs Lab 04/25/16 1459 04/26/16 0051  WBC 14.5* 11.8*  CREATININE 1.10 0.89    Estimated Creatinine Clearance: 77.4 mL/min (by C-G formula based on SCr of 0.89 mg/dL).    No Known Allergies  Antimicrobials this admission: 8/14 Zosyn >>   8/51 Eraxis >>    Dose adjustments this admission:    Microbiology results: 8/14 MRSA PCR: sent  Thank you for allowing pharmacy to be a part of this patient's care.  Everette Rank, PharmD 04/26/2016 2:08 AM

## 2016-04-26 NOTE — Progress Notes (Signed)
1 Day Post-Op  Subjective: He is wake and alert. Anxious to get tube out.  Sites look fine.  Betadine in the wound.  I looked but did not redo the dressings.  Will start dressing changes tomorrow.  Nothing in the ostomy bag as expected at this point.  Objective: Vital signs in last 24 hours: Temp:  [97.8 F (36.6 C)-99.6 F (37.6 C)] 98.5 F (36.9 C) (08/15 0415) Pulse Rate:  [71-103] 89 (08/15 0907) Resp:  [0-18] 0 (08/15 0630) BP: (127-189)/(75-105) 172/105 (08/15 0907) SpO2:  [95 %-100 %] 97 % (08/15 0907) FiO2 (%):  [30 %-40 %] 30 % (08/15 0907) Weight:  [74.8 kg (165 lb)-78.7 kg (173 lb 8 oz)] 78.7 kg (173 lb 8 oz) (08/15 0000)  3 liter positive IV fluids 845 urine Afebrile, TM 99.6 BP up at 7 AM Labs OK  Intake/Output from previous day: 08/14 0701 - 08/15 0700 In: 3962.9 [I.V.:3394.9; IV Piggyback:568] Out: 945 [Urine:845; Blood:100] Intake/Output this shift: No intake/output data recorded.  General appearance: alert, cooperative and still on the Vent. GI: Open wound oK. site ok, nothing in the colostomy bag.    Lab Results:   Recent Labs  04/25/16 1459 04/26/16 0051  WBC 14.5* 11.8*  HGB 16.7 16.5  HCT 48.3 48.3  PLT 168 176    BMET  Recent Labs  04/25/16 1459 04/26/16 0051  NA 135 135  K 4.1 4.3  CL 99* 105  CO2 26 24  GLUCOSE 129* 146*  BUN 15 12  CREATININE 1.10 0.89  CALCIUM 9.2 8.2*   PT/INR  Recent Labs  04/25/16 1812  LABPROT 14.9  INR 1.16     Recent Labs Lab 04/25/16 1459  AST 27  ALT 30  ALKPHOS 50  BILITOT 1.4*  PROT 7.7  ALBUMIN 4.8     Lipase     Component Value Date/Time   LIPASE 16 04/25/2016 1459     Studies/Results: Dg Chest 2 View  Result Date: 04/25/2016 CLINICAL DATA:  Pneumoperitoneum.  Preoperative chest x-ray. EXAM: CHEST  2 VIEW COMPARISON:  CT abdomen and pelvis from the same day. FINDINGS: The heart size is normal. Pneumoperitoneum is present. Bibasilar airspace opacities are again noted. The  upper lung fields are clear. The visualized soft tissues and bony thorax are unremarkable. IMPRESSION: 1. Pneumoperitoneum. 2. Bibasilar airspace opacities are similar to the for are studied. These likely reflect atelectasis associated with diaphragmatic irritation. Electronically Signed   By: San Morelle M.D.   On: 04/25/2016 19:14   Ct Abdomen Pelvis W Contrast  Result Date: 04/25/2016 CLINICAL DATA:  Generalized abdominal pain for 1 day. Constipation. Severe pain after large bowel movement yesterday and severe pain while attempting to have bowel movement today. EXAM: CT ABDOMEN AND PELVIS WITH CONTRAST TECHNIQUE: Multidetector CT imaging of the abdomen and pelvis was performed using the standard protocol following bolus administration of intravenous contrast. CONTRAST:  150mL ISOVUE-300 IOPAMIDOL (ISOVUE-300) INJECTION 61% COMPARISON:  None. FINDINGS: Lower chest: Bibasilar airspace disease likely reflects atelectasis. There is some consolidation on the left which could represent early infection or aspiration. The heart size is normal. Calcifications are present at the aortic valves. Hepatobiliary: No focal hepatic lesions are present. Liver contour is within normal limits. Common bile duct is normal. Small layering stones are present at the neck of the gallbladder without evidence for inflammation. Pancreas: No significant inflammatory changes are present. No focal solid or cystic mass lesion is present. The pancreatic duct is within normal limits. Spleen:  Unremarkable Adrenals/Urinary Tract: The adrenal glands are normal bilaterally. Kidneys and ureters are within normal limits. Urinary bladder is mostly collapsed. Stomach/Bowel: Free air a is layering within the abdomen extends into an umbilical hernia. There is diffuse free air within the colonic mesenteries. A small hiatal hernia is present. The stomach and duodenum are otherwise within normal limits. The proximal small bowel is unremarkable.  Fluid filled loops of bowel in the mid abdomen are noted. The more distal small bowel is collapsed. There is some wall thickening of the terminal ileum at the ileocecal valve. Inflammatory changes surround the ascending colon. No discrete mass lesion is present. Moderate stool is present throughout the colon to the level of a redundant sigmoid. The distal sigmoid and rectum are decompressed. Vascular/Lymphatic: Atherosclerotic calcifications are present in the distal abdominal aorta and branch vessels. No significant adenopathy is present. Reproductive: Within normal limits Other: Extensive free air is present. There is no significant free fluid. Musculoskeletal: Bone windows demonstrate mild rightward curvature at the thoracolumbar lumbar junction. A remote superior endplate fractures present at L5. No other focal osseous lesions are present. The pelvis is intact. IMPRESSION: 1. Pneumoperitoneum. 2. Moderate stool throughout the colon involving a a redundant sigmoid without focal obstruction or mass. 3. Mild inflammatory changes at the level the cecum and ileocecal valve without focal obstruction or mass. 4. The distal sigmoid and rectum are collapsed. 5. Atherosclerosis. 6. Bibasilar airspace disease is slightly greater than expected for atelectasis. Infection or aspiration is not excluded, particularly at the left base. These results were called by telephone at the time of interpretation on 04/25/2016 at 5:22 pm to Dr. Addison Lank , who verbally acknowledged these results. Electronically Signed   By: San Morelle M.D.   On: 04/25/2016 17:22   Dg Chest Port 1 View  Result Date: 04/25/2016 CLINICAL DATA:  Postoperative.  Endotracheal tube placement. EXAM: PORTABLE CHEST 1 VIEW COMPARISON:  04/25/2016 FINDINGS: Interval placement of an endotracheal tube with tip measuring 6.1 cm above the carina. Heart size and pulmonary vascularity are normal for technique. Shallow inspiration with linear atelectasis  in the lung bases. Tortuous aorta. No pneumothorax. IMPRESSION: Endotracheal tube tip measures 6.9 cm above the carina. Electronically Signed   By: Lucienne Capers M.D.   On: 04/25/2016 23:36   Prior to Admission medications   Medication Sig Start Date End Date Taking? Authorizing Provider  ibuprofen (ADVIL,MOTRIN) 200 MG tablet Take 200 mg by mouth every 6 (six) hours as needed for headache, mild pain or moderate pain.   Yes Historical Provider, MD     Medications: . [START ON 04/27/2016] anidulafungin  100 mg Intravenous Q24H  . enoxaparin (LOVENOX) injection  40 mg Subcutaneous Q24H  . famotidine (PEPCID) IV  20 mg Intravenous Q12H  . piperacillin-tazobactam (ZOSYN)  IV  3.375 g Intravenous Q8H   . dextrose 5 % and 0.45 % NaCl with KCl 20 mEq/L 125 mL/hr at 04/26/16 0819  . propofol (DIPRIVAN) infusion 10 mcg/kg/min (04/26/16 0843)    Hypertension Hx of melanoma  Assessment/Plan Perforated distal sigmoid colon (stercoral) with fecal peritonitis. (8 cm tear) Exploratory laparotomy with sigmoid colectomy, descending colostomy, Dr. Armandina Gemma 04/25/16 POD 1 Chronic constipation FEN: NPO/IV fluids ID: Zosyn/andiulafungin day 2 DVT:  SCD/Lovenox  Plan:  Vent per CCM, I talked with them, and relayed concern that primary issue is sepsis.  Continue antibiotics andantifungal      LOS: 1 day    Frank Novelo 04/26/2016 [phone]

## 2016-04-26 NOTE — Consult Note (Signed)
PULMONARY / CRITICAL CARE MEDICINE   Name: [name] Brown MRN: [id number] DOB: 11/11/1950    ADMISSION DATE:  04/25/2016 CONSULTATION DATE:  04/26/2016  REFERRING MD:  Dr. Harlow Asa, CCS  CHIEF COMPLAINT: Abdominal pain.  HISTORY OF PRESENT ILLNESS:   65 yo male had constipation and then developed severe, generalized abdominal pain.  He did not have fever, chills, nausea, vomiting, or bleeding.  He was found to have perforated distal sigmoid colon.  He had laparotomy with sigmoid colectomy and colostomy.  He remained on vent post-op.  PAST MEDICAL HISTORY :  He  has a past medical history of Hypertension and Melanoma ([place]).  PAST SURGICAL HISTORY: He  has a past surgical history that includes Melanoma excision.  No Known Allergies  No current facility-administered medications on file prior to encounter.    No current outpatient prescriptions on file prior to encounter.    FAMILY HISTORY:  His has no family status information on file.    SOCIAL HISTORY: He  reports that he has never smoked. He has never used smokeless tobacco. He reports that he drinks alcohol. He reports that he does not use drugs.  REVIEW OF SYSTEMS:   Negative except above.  SUBJECTIVE:  Denies chest pain.  VITAL SIGNS: BP (!) 175/89   Pulse 87   Temp 99.6 F (37.6 C) (Oral)   Resp 16   Ht 5\' 7"  (1.702 m)   Wt 165 lb (74.8 kg)   SpO2 98%   BMI 25.84 kg/m   HEMODYNAMICS:    VENTILATOR SETTINGS: Vent Mode: PRVC FiO2 (%):  [40 %] 40 % Set Rate:  [14 bmp-16 bmp] 16 bmp Vt Set:  [500 mL-530 mL] 530 mL PEEP:  [5 cmH20] 5 cmH20 Plateau Pressure:  [15 cmH20] 15 cmH20  INTAKE / OUTPUT: No intake/output data recorded.  PHYSICAL EXAMINATION: General:  Intubated. Neuro:  RASS 0, follows commands, moves extremities HEENT:  ETT in place Cardiovascular:  Regular, no murmur Lungs:  No wheeze Abdomen:  Wound dressing clean, colostomy in LLQ Musculoskeletal:  No edema Skin:  No  rashes  LABS:  BMET  Recent Labs Lab 04/25/16 1459 04/26/16 0051  NA 135 135  K 4.1 4.3  CL 99* 105  CO2 26 24  BUN 15 12  CREATININE 1.10 0.89  GLUCOSE 129* 146*    Electrolytes  Recent Labs Lab 04/25/16 1459 04/26/16 0051  CALCIUM 9.2 8.2*  MG  --  1.6*    CBC  Recent Labs Lab 04/25/16 1459 04/26/16 0051  WBC 14.5* 11.8*  HGB 16.7 16.5  HCT 48.3 48.3  PLT 168 176    Coag's  Recent Labs Lab 04/25/16 1812  INR 1.16    Sepsis Markers No results for input(s): LATICACIDVEN, PROCALCITON, O2SATVEN in the last 168 hours.  ABG  Recent Labs Lab 04/25/16 2330  PHART 7.339*  PCO2ART 46.0*  PO2ART 113*    Liver Enzymes  Recent Labs Lab 04/25/16 1459  AST 27  ALT 30  ALKPHOS 50  BILITOT 1.4*  ALBUMIN 4.8    Cardiac Enzymes No results for input(s): TROPONINI, PROBNP in the last 168 hours.  Glucose  Recent Labs Lab 04/26/16 0001  GLUCAP 134*    Imaging Dg Chest 2 View  Result Date: 04/25/2016 CLINICAL DATA:  Pneumoperitoneum.  Preoperative chest x-ray. EXAM: CHEST  2 VIEW COMPARISON:  CT abdomen and pelvis from the same day. FINDINGS: The heart size is normal. Pneumoperitoneum is present. Bibasilar airspace opacities are again noted. The upper  lung fields are clear. The visualized soft tissues and bony thorax are unremarkable. IMPRESSION: 1. Pneumoperitoneum. 2. Bibasilar airspace opacities are similar to the for are studied. These likely reflect atelectasis associated with diaphragmatic irritation. Electronically Signed   By: San Morelle M.D.   On: 04/25/2016 19:14   Ct Abdomen Pelvis W Contrast  Result Date: 04/25/2016 CLINICAL DATA:  Generalized abdominal pain for 1 day. Constipation. Severe pain after large bowel movement yesterday and severe pain while attempting to have bowel movement today. EXAM: CT ABDOMEN AND PELVIS WITH CONTRAST TECHNIQUE: Multidetector CT imaging of the abdomen and pelvis was performed using the  standard protocol following bolus administration of intravenous contrast. CONTRAST:  173mL ISOVUE-300 IOPAMIDOL (ISOVUE-300) INJECTION 61% COMPARISON:  None. FINDINGS: Lower chest: Bibasilar airspace disease likely reflects atelectasis. There is some consolidation on the left which could represent early infection or aspiration. The heart size is normal. Calcifications are present at the aortic valves. Hepatobiliary: No focal hepatic lesions are present. Liver contour is within normal limits. Common bile duct is normal. Small layering stones are present at the neck of the gallbladder without evidence for inflammation. Pancreas: No significant inflammatory changes are present. No focal solid or cystic mass lesion is present. The pancreatic duct is within normal limits. Spleen: Unremarkable Adrenals/Urinary Tract: The adrenal glands are normal bilaterally. Kidneys and ureters are within normal limits. Urinary bladder is mostly collapsed. Stomach/Bowel: Free air a is layering within the abdomen extends into an umbilical hernia. There is diffuse free air within the colonic mesenteries. A small hiatal hernia is present. The stomach and duodenum are otherwise within normal limits. The proximal small bowel is unremarkable. Fluid filled loops of bowel in the mid abdomen are noted. The more distal small bowel is collapsed. There is some wall thickening of the terminal ileum at the ileocecal valve. Inflammatory changes surround the ascending colon. No discrete mass lesion is present. Moderate stool is present throughout the colon to the level of a redundant sigmoid. The distal sigmoid and rectum are decompressed. Vascular/Lymphatic: Atherosclerotic calcifications are present in the distal abdominal aorta and branch vessels. No significant adenopathy is present. Reproductive: Within normal limits Other: Extensive free air is present. There is no significant free fluid. Musculoskeletal: Bone windows demonstrate mild rightward  curvature at the thoracolumbar lumbar junction. A remote superior endplate fractures present at L5. No other focal osseous lesions are present. The pelvis is intact. IMPRESSION: 1. Pneumoperitoneum. 2. Moderate stool throughout the colon involving a a redundant sigmoid without focal obstruction or mass. 3. Mild inflammatory changes at the level the cecum and ileocecal valve without focal obstruction or mass. 4. The distal sigmoid and rectum are collapsed. 5. Atherosclerosis. 6. Bibasilar airspace disease is slightly greater than expected for atelectasis. Infection or aspiration is not excluded, particularly at the left base. These results were called by telephone at the time of interpretation on 04/25/2016 at 5:22 pm to Dr. Addison Lank , who verbally acknowledged these results. Electronically Signed   By: San Morelle M.D.   On: 04/25/2016 17:22   Dg Chest Port 1 View  Result Date: 04/25/2016 CLINICAL DATA:  Postoperative.  Endotracheal tube placement. EXAM: PORTABLE CHEST 1 VIEW COMPARISON:  04/25/2016 FINDINGS: Interval placement of an endotracheal tube with tip measuring 6.1 cm above the carina. Heart size and pulmonary vascularity are normal for technique. Shallow inspiration with linear atelectasis in the lung bases. Tortuous aorta. No pneumothorax. IMPRESSION: Endotracheal tube tip measures 6.9 cm above the carina. Electronically Signed  By: Lucienne Capers M.D.   On: 04/25/2016 23:36     STUDIES:  8/14 CT abd/pelvis >> pneumoperitoneum, atherosclerosis  CULTURES:  ANTIBIOTICS: 8/15 Zosyn >> 8/15 Eraxis >>   SIGNIFICANT EVENTS: 8/14 Admitted, laparotomy  LINES/TUBES: ETT 8/14 >>   DISCUSSION: 65 yo male with perforated sigmoid colon s/p laparotomy with sigmoid colectomy and colostomy.  Remained on vent post-op.  ASSESSMENT / PLAN:  PULMONARY A: Ventilatory support. P:   Defer timing of weaning/extubation to surgery >> if no additional surgical interventions needed,  then anticipate he could get extubated soon  CARDIOVASCULAR A:  Hx of HTN. P:  Prn labetalol for SBP > 170  RENAL A:   Oliguria. P:   Continue IV fluids  GASTROINTESTINAL A:   Perforated sigmoid colon. P:   Post-op care, nutrition per CCS Pepcid for SUP while on vent  HEMATOLOGIC A:   Leukocytosis. P:  F/u CBC Lovenox for DVT prophylaxis  INFECTIOUS A:   Perforated sigmoid colon. P:   Zosyn, eraxis for now  ENDOCRINE A:   Hyperglycemia, mild >> no hx of DM. P:   Monitor blood sugar on BMET  NEUROLOGIC A:   Sedation, post-op pain control. P:   RASS goal: 0 to -1 PAD protocol  CC time 38 minutes.  Chesley Mires, MD [facility] Pulmonary/Critical Care 04/26/2016, 3:19 AM Pager:  [phone] After 3pm call: [phone]

## 2016-04-26 NOTE — Progress Notes (Signed)
   [facility] PCCM  Pt doing well on PST. Pulling 1L tidal volume. No surgery planned. Plan to extubate pt.  Monica Becton, MD 04/26/2016, 11:14 AM [facility] Pager [phone] After 3 pm or if no answer, call [phone]

## 2016-04-26 NOTE — Progress Notes (Signed)
1 Day Post-Op  Subjective: Awake and alert on vent.    Objective: Vital signs in last 24 hours: Temp:  [97.5 F (36.4 C)-99.6 F (37.6 C)] 97.5 F (36.4 C) (08/15 0800) Pulse Rate:  [71-103] 89 (08/15 0907) Resp:  [0-18] 0 (08/15 0630) BP: (127-189)/(75-105) 172/105 (08/15 0907) SpO2:  [95 %-100 %] 97 % (08/15 0907) FiO2 (%):  [30 %-40 %] 30 % (08/15 0907) Weight:  [74.8 kg (165 lb)-78.7 kg (173 lb 8 oz)] 78.7 kg (173 lb 8 oz) (08/15 0000)    Intake/Output from previous day: 08/14 0701 - 08/15 0700 In: 3962.9 [I.V.:3394.9; IV Piggyback:568] Out: [id number] [Urine:845; Blood:100] Intake/Output this shift: No intake/output data recorded.  PE: General- In NAD Abdomen-slight firm and distended, open areas of wound clean, colostomy pink with no output  Lab Results:   Recent Labs  04/25/16 1459 04/26/16 0051  WBC 14.5* 11.8*  HGB 16.7 16.5  HCT 48.3 48.3  PLT 168 176   BMET  Recent Labs  04/25/16 1459 04/26/16 0051  NA 135 135  K 4.1 4.3  CL 99* 105  CO2 26 24  GLUCOSE 129* 146*  BUN 15 12  CREATININE 1.10 0.89  CALCIUM 9.2 8.2*   PT/INR  Recent Labs  04/25/16 1812  LABPROT 14.9  INR 1.16   Comprehensive Metabolic Panel:    Component Value Date/Time   NA 135 04/26/2016 0051   NA 135 04/25/2016 1459   K 4.3 04/26/2016 0051   K 4.1 04/25/2016 1459   CL 105 04/26/2016 0051   CL 99 (L) 04/25/2016 1459   CO2 24 04/26/2016 0051   CO2 26 04/25/2016 1459   BUN 12 04/26/2016 0051   BUN 15 04/25/2016 1459   CREATININE 0.89 04/26/2016 0051   CREATININE 1.10 04/25/2016 1459   GLUCOSE 146 (H) 04/26/2016 0051   GLUCOSE 129 (H) 04/25/2016 1459   CALCIUM 8.2 (L) 04/26/2016 0051   CALCIUM 9.2 04/25/2016 1459   AST 27 04/25/2016 1459   ALT 30 04/25/2016 1459   ALKPHOS 50 04/25/2016 1459   BILITOT 1.4 (H) 04/25/2016 1459   PROT 7.7 04/25/2016 1459   ALBUMIN 4.8 04/25/2016 1459     Studies/Results: Dg Chest 2 View  Result Date: 04/25/2016 CLINICAL DATA:   Pneumoperitoneum.  Preoperative chest x-ray. EXAM: CHEST  2 VIEW COMPARISON:  CT abdomen and pelvis from the same day. FINDINGS: The heart size is normal. Pneumoperitoneum is present. Bibasilar airspace opacities are again noted. The upper lung fields are clear. The visualized soft tissues and bony thorax are unremarkable. IMPRESSION: 1. Pneumoperitoneum. 2. Bibasilar airspace opacities are similar to the for are studied. These likely reflect atelectasis associated with diaphragmatic irritation. Electronically Signed   By: San Morelle M.D.   On: 04/25/2016 19:14   Ct Abdomen Pelvis W Contrast  Result Date: 04/25/2016 CLINICAL DATA:  Generalized abdominal pain for 1 day. Constipation. Severe pain after large bowel movement yesterday and severe pain while attempting to have bowel movement today. EXAM: CT ABDOMEN AND PELVIS WITH CONTRAST TECHNIQUE: Multidetector CT imaging of the abdomen and pelvis was performed using the standard protocol following bolus administration of intravenous contrast. CONTRAST:  180mL ISOVUE-300 IOPAMIDOL (ISOVUE-300) INJECTION 61% COMPARISON:  None. FINDINGS: Lower chest: Bibasilar airspace disease likely reflects atelectasis. There is some consolidation on the left which could represent early infection or aspiration. The heart size is normal. Calcifications are present at the aortic valves. Hepatobiliary: No focal hepatic lesions are present. Liver contour is within normal limits.  Common bile duct is normal. Small layering stones are present at the neck of the gallbladder without evidence for inflammation. Pancreas: No significant inflammatory changes are present. No focal solid or cystic mass lesion is present. The pancreatic duct is within normal limits. Spleen: Unremarkable Adrenals/Urinary Tract: The adrenal glands are normal bilaterally. Kidneys and ureters are within normal limits. Urinary bladder is mostly collapsed. Stomach/Bowel: Free air a is layering within the  abdomen extends into an umbilical hernia. There is diffuse free air within the colonic mesenteries. A small hiatal hernia is present. The stomach and duodenum are otherwise within normal limits. The proximal small bowel is unremarkable. Fluid filled loops of bowel in the mid abdomen are noted. The more distal small bowel is collapsed. There is some wall thickening of the terminal ileum at the ileocecal valve. Inflammatory changes surround the ascending colon. No discrete mass lesion is present. Moderate stool is present throughout the colon to the level of a redundant sigmoid. The distal sigmoid and rectum are decompressed. Vascular/Lymphatic: Atherosclerotic calcifications are present in the distal abdominal aorta and branch vessels. No significant adenopathy is present. Reproductive: Within normal limits Other: Extensive free air is present. There is no significant free fluid. Musculoskeletal: Bone windows demonstrate mild rightward curvature at the thoracolumbar lumbar junction. A remote superior endplate fractures present at L5. No other focal osseous lesions are present. The pelvis is intact. IMPRESSION: 1. Pneumoperitoneum. 2. Moderate stool throughout the colon involving a a redundant sigmoid without focal obstruction or mass. 3. Mild inflammatory changes at the level the cecum and ileocecal valve without focal obstruction or mass. 4. The distal sigmoid and rectum are collapsed. 5. Atherosclerosis. 6. Bibasilar airspace disease is slightly greater than expected for atelectasis. Infection or aspiration is not excluded, particularly at the left base. These results were called by telephone at the time of interpretation on 04/25/2016 at 5:22 pm to Dr. Addison Lank , who verbally acknowledged these results. Electronically Signed   By: San Morelle M.D.   On: 04/25/2016 17:22   Dg Chest Port 1 View  Result Date: 04/25/2016 CLINICAL DATA:  Postoperative.  Endotracheal tube placement. EXAM: PORTABLE CHEST  1 VIEW COMPARISON:  04/25/2016 FINDINGS: Interval placement of an endotracheal tube with tip measuring 6.1 cm above the carina. Heart size and pulmonary vascularity are normal for technique. Shallow inspiration with linear atelectasis in the lung bases. Tortuous aorta. No pneumothorax. IMPRESSION: Endotracheal tube tip measures 6.9 cm above the carina. Electronically Signed   By: Lucienne Capers M.D.   On: 04/25/2016 23:36    Anti-infectives: Anti-infectives    Start     Dose/Rate Route Frequency Ordered Stop   04/27/16 0300  anidulafungin (ERAXIS) 100 mg in sodium chloride 0.9 % 100 mL IVPB     100 mg over 90 Minutes Intravenous Every 24 hours 04/26/16 0207     04/26/16 0300  anidulafungin (ERAXIS) 200 mg in sodium chloride 0.9 % 200 mL IVPB     200 mg over 180 Minutes Intravenous  Once 04/26/16 0207 04/26/16 0617   04/26/16 0200  piperacillin-tazobactam (ZOSYN) IVPB 3.375 g  Status:  Discontinued     3.375 g 12.5 mL/hr over 240 Minutes Intravenous Every 8 hours 04/25/16 1733 04/25/16 2333   04/26/16 0200  piperacillin-tazobactam (ZOSYN) IVPB 3.375 g     3.375 g 12.5 mL/hr over 240 Minutes Intravenous Every 8 hours 04/25/16 2326     04/25/16 1745  piperacillin-tazobactam (ZOSYN) IVPB 3.375 g     3.375 g 100  mL/hr over 30 Minutes Intravenous STAT 04/25/16 1733 04/25/16 1816      Assessment   Perforated sigmoid colon ([place]) due to stercoral ulcer s/p Hartmann procedure 04/25/16 (Dr. Lorelle Formosa overnight    LOS: 1 day   Plan: Start dressing changes.   Nanami Whitelaw J 04/26/2016

## 2016-04-26 NOTE — Op Note (Signed)
NAMESHAHEED, KOERBER NO.:  [id number]  MEDICAL RECORD NO.:  [id number]  LOCATION:  [id number]                         FACILITY:  [facility]  PHYSICIAN:  Earnstine Regal, MD      DATE OF BIRTH:  07-03-1951  DATE OF PROCEDURE:  04/25/2016                              OPERATIVE REPORT   PREOPERATIVE DIAGNOSIS:  Perforated viscus.  POSTOPERATIVE DIAGNOSIS:  Perforated distal sigmoid colon (stercoral) with fecal peritonitis.  PROCEDURES: 1. Exploratory laparotomy. 2. Sigmoid colectomy. 3. Descending colostomy.  SURGEON:  Earnstine Regal, MD, FACS  ANESTHESIA:  General per Dr. Finis Bud.  ESTIMATED BLOOD LOSS:  100 mL.  PREPARATION:  ChloraPrep.  COMPLICATIONS:  None.  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  CT scan of the abdomen and pelvis shows extensive free air within the peritoneal cavity.  General Surgery was consulted and the patient was prepared urgently and brought to the operating room for exploration.  BODY OF REPORT:  Procedure was done in OR #1 at the [facility].  The patient was brought to the operating room, placed in supine position on the operating room table.  Following administration of general anesthesia, the patient was positioned and then prepped and draped in usual aseptic fashion.  After ascertaining that an adequate level of anesthesia had been achieved, a midline incision was made just above the umbilicus.  Dissection was carried down to the fascia and the fascia was incised and the peritoneal cavity was entered cautiously.  There was brown, thick fluid within the peritoneal cavity.  Incision was extended around the umbilicus and down the lower midline.  There was an umbilical hernia with incarcerated omentum, which was reduced and the skin elevated off the abdominal wall.   The abdomen was then explored.  There was fecal peritonitis with gross contamination of the entire peritoneal cavity.  There was a markedly redundant sigmoid colon containing a large stool burden.  There was a large stool burden throughout the colon.  The cecum was mildly dilated.  Exploration distally revealed a large anterior perforation of the rectosigmoid colon measuring approximately 8 cm in length.  It was not actively bleeding. Balfour retractor was placed for exposure.  Lateral descending colon was mobilized from its lateral peritoneal attachments.  There was a markedly redundant sigmoid colon with a large loop of sigmoid adherent to itself.  This was gently separated with the electrocautery.  A point at the distal descending colon was selected and transected with the GIA stapler.  Mesentery was divided with the LigaSure.  Mesentery has followed around the redundant loop of sigmoid colon and was divided with the LigaSure.  Dissection was then carried distally down to the level of the rectosigmoid colon and just distal to the perforation.  Bowel was transected at that point with the GIA stapler.  Unfortunately, the staple line did not remain intact and open. Therefore, the remainder of the mesentery was divided with the LigaSure and the entire sigmoid colon was resected and passed off the field as specimen, labeled as sigmoid  colon with the perforation distal.  Next, the rectosigmoid colon was prepared by excising the partial staple line, which exists.  The bowel was then closed with interrupted 2-0 silk sutures.  2-0 Prolene sutures were used to mark either end of the suture line.  Good hemostasis was noted.  Mesentery was inspected for hemostasis and good hemostasis obtained with interrupted 2-0 silk sutures as needed.  At this point, the entire peritoneal cavity was irrigated with several liters of warm saline.  All fecal material was retrieved and removed from the  peritoneal cavity.  Next, an elliptical incision was made in the left midabdominal wall in preparation for colostomy.  A plug of adipose tissue was excised.  A cruciate incision was made in the muscle fascia and the rectus muscle was split and the peritoneal cavity was entered cautiously.  Using a Babcock clamp, the distal descending colon was delivered through the abdominal wall.  It was secured at the peritoneal surface with interrupted 3-0 silk sutures.  Fluid was evacuated from the peritoneal cavity.  Omentum was used to cover the small bowel.  Midline incision was closed with interrupted #1 Novafil simple sutures.  Subcutaneous tissues were irrigated.  Umbilical skin was laid in place and secured with skin staples.  Betadine-soaked sponges were placed above and below the level of the umbilicus and the subcutaneous tissues.  Next, the staple line was excised from the distal descending colon.  The colostomy was then matured in the usual fashion with interrupted 3-0 Vicryl sutures circumferentially.  Colostomy appliance and dressings were applied.  The patient was transported, intubated to the intensive care unit.  Critical Care Medicine will be consulted to assist in management.  The patient was stable at the end of the surgical procedure.   Earnstine Regal, MD, [facility] Office: [phone]    TMG/MEDQ  D:  04/25/2016  T:  04/26/2016  Job:  [id number]

## 2016-04-26 NOTE — Progress Notes (Signed)
Initial Nutrition Assessment  DOCUMENTATION CODES:   Not applicable  INTERVENTION:  - RD will follow-up 8/16 for POC per Surgery.  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 No hx of diverticular disease.  No prior abdominal surgery.  Long-standing umbilcal hernia.  Never had colonoscopy.  Attempted to have BM today without success but with recurrence of pain.  Pt seen for low Braden. BMI indicates overweight status. Pt underwent emergent ex lap with sigmoid colectomy and colostomy 04/25/16. Pt remains on the vent at time of visit this AM but is able to nod/shake head as RD is asking questions to family members, who is at bedside. Notes indicate that PTA pt had experienced abdominal pain with BMs. Family and pt deny any pain related to eating; family member states pt with very good appetite PTA. Family and pt also deny any recent weight fluctuations.   Patient is currently intubated on ventilator support MV: 10.6 L/min Temp (24hrs), Avg:98.6 F (37 C), Min:97.8 F (36.6 C), Max:99.6 F (37.6 C) Propofol: 4.7 ml/hr (124 kcal).  Will monitor for POC per surgery concerning route of nutrition. No previous weight hx available in the chart for comparison. No muscle or fat wasting, no edema at this time time. Between IVF and Propofol, pt is currently receiving 634 kcal.  Medications reviewed; Labs reviewed;  IVF: D5-1/2 NS-20 mEq KCl @ 125 mL/hr (510 kcal) Drip: Propofol @ 10 mcg/kg/min.   Diet Order:  Diet NPO time specified  Skin:  Wound (see comment) (Abdominal incision from 04/25/16)  Last BM:   PTA  Height:   Ht Readings from Last 1 Encounters:  04/25/16 5\' 7"  (1.702 m)    Weight:   Wt Readings from Last 1 Encounters:  04/26/16 173 lb 8 oz (78.7 kg)    Ideal Body Weight:  67.27 kg  BMI:  Body mass index is 27.17 kg/m.  Estimated Nutritional Needs:   Kcal:  1870  Protein:  94-118 grams (1.2-1.5 grams/kg)  Fluid:  1.8-2 L/day  EDUCATION NEEDS:   No education needs identified at this time    [name], MS, RD, LDN Inpatient Clinical Dietitian Pager # [phone] After hours/weekend pager # [phone]

## 2016-04-26 NOTE — Procedures (Addendum)
Extubation Procedure Note  Patient Details:   Name: [name] Brown DOB: July 29, 1951 MRN: [id number]   Airway Documentation:     Evaluation  O2 sats: 99  Complications: No apparent complications Patient did tolerate procedure well. Bilateral Breath Sounds: Clear   Yes. Pt is awake and can follow commands. Deep oral sxn done. Pt has positive cuff leak with a good cough/gag. Per MD order extubated pt to 2L [place]. Pt tol well. BBS Clear, no stridor. Pt tole well. Pt in no distress.  Constance Holster 04/26/2016, 11:27 AM

## 2016-04-26 NOTE — Progress Notes (Addendum)
Oral sxn done- small tan white. RT did cuff leak test. Pt has a positive cuff leak. Pt tol well. Pt placed on cpap/psv 5/5, 30%. Pt awake and can follow commands

## 2016-04-26 NOTE — Progress Notes (Signed)
PULMONARY / CRITICAL CARE MEDICINE   Name: [name] Brown MRN: [id number] DOB: 07/30/1951    ADMISSION DATE:  04/25/2016 CONSULTATION DATE:  04/26/2016  REFERRING MD:  Dr. Harlow Asa, CCS  CHIEF COMPLAINT: Abdominal pain.  HISTORY OF PRESENT ILLNESS:   65 yo male had constipation and then developed severe, generalized abdominal pain.  He did not have fever, chills, nausea, vomiting, or bleeding.  He was found to have perforated distal sigmoid colon.  He had laparotomy with sigmoid colectomy and colostomy.  He remained on vent post-op.   SUBJECTIVE:  Denies chest pain.  VITAL SIGNS: BP (!) 172/105   Pulse 89   Temp 97.5 F (36.4 C) (Axillary)   Resp (!) 0   Ht 5\' 7"  (1.702 m)   Wt 78.7 kg (173 lb 8 oz)   SpO2 97%   BMI 27.17 kg/m   HEMODYNAMICS:    VENTILATOR SETTINGS: Vent Mode: PRVC FiO2 (%):  [30 %-40 %] 30 % Set Rate:  [14 bmp-20 bmp] 20 bmp Vt Set:  [500 mL-530 mL] 530 mL PEEP:  [5 cmH20] 5 cmH20 Plateau Pressure:  [9 cmH20-16 cmH20] 16 cmH20  INTAKE / OUTPUT: I/O last 3 completed shifts: In: 3962.9 [I.V.:3394.9; IV Piggyback:568] Out: [id number] [Urine:845; Blood:100]  PHYSICAL EXAMINATION: General:  Intubated. Awake and doing well on PST earlier.  Neuro:  RASS 0, follows commands, moves extremities HEENT:  ETT in place Cardiovascular:  Regular, no murmur Lungs:  No wheeze  Good ae. (-) crackles/rhonchi Abdomen:  Wound dressing clean, colostomy in LLQ Musculoskeletal:  No edema Skin:  No rashes  LABS:  BMET  Recent Labs Lab 04/25/16 1459 04/26/16 0051  NA 135 135  K 4.1 4.3  CL 99* 105  CO2 26 24  BUN 15 12  CREATININE 1.10 0.89  GLUCOSE 129* 146*    Electrolytes  Recent Labs Lab 04/25/16 1459 04/26/16 0051  CALCIUM 9.2 8.2*  MG  --  1.6*    CBC  Recent Labs Lab 04/25/16 1459 04/26/16 0051  WBC 14.5* 11.8*  HGB 16.7 16.5  HCT 48.3 48.3  PLT 168 176    Coag's  Recent Labs Lab 04/25/16 1812  INR 1.16    Sepsis Markers No  results for input(s): LATICACIDVEN, PROCALCITON, O2SATVEN in the last 168 hours.  ABG  Recent Labs Lab 04/25/16 2330 04/26/16 0500  PHART 7.339* 7.357  PCO2ART 46.0* 42.7  PO2ART 113* 117*    Liver Enzymes  Recent Labs Lab 04/25/16 1459  AST 27  ALT 30  ALKPHOS 50  BILITOT 1.4*  ALBUMIN 4.8    Cardiac Enzymes No results for input(s): TROPONINI, PROBNP in the last 168 hours.  Glucose  Recent Labs Lab 04/26/16 0001  GLUCAP 134*    Imaging Dg Chest 2 View  Result Date: 04/25/2016 CLINICAL DATA:  Pneumoperitoneum.  Preoperative chest x-ray. EXAM: CHEST  2 VIEW COMPARISON:  CT abdomen and pelvis from the same day. FINDINGS: The heart size is normal. Pneumoperitoneum is present. Bibasilar airspace opacities are again noted. The upper lung fields are clear. The visualized soft tissues and bony thorax are unremarkable. IMPRESSION: 1. Pneumoperitoneum. 2. Bibasilar airspace opacities are similar to the for are studied. These likely reflect atelectasis associated with diaphragmatic irritation. Electronically Signed   By: San Morelle M.D.   On: 04/25/2016 19:14   Ct Abdomen Pelvis W Contrast  Result Date: 04/25/2016 CLINICAL DATA:  Generalized abdominal pain for 1 day. Constipation. Severe pain after large bowel movement yesterday and severe pain while  attempting to have bowel movement today. EXAM: CT ABDOMEN AND PELVIS WITH CONTRAST TECHNIQUE: Multidetector CT imaging of the abdomen and pelvis was performed using the standard protocol following bolus administration of intravenous contrast. CONTRAST:  149mL ISOVUE-300 IOPAMIDOL (ISOVUE-300) INJECTION 61% COMPARISON:  None. FINDINGS: Lower chest: Bibasilar airspace disease likely reflects atelectasis. There is some consolidation on the left which could represent early infection or aspiration. The heart size is normal. Calcifications are present at the aortic valves. Hepatobiliary: No focal hepatic lesions are present. Liver  contour is within normal limits. Common bile duct is normal. Small layering stones are present at the neck of the gallbladder without evidence for inflammation. Pancreas: No significant inflammatory changes are present. No focal solid or cystic mass lesion is present. The pancreatic duct is within normal limits. Spleen: Unremarkable Adrenals/Urinary Tract: The adrenal glands are normal bilaterally. Kidneys and ureters are within normal limits. Urinary bladder is mostly collapsed. Stomach/Bowel: Free air a is layering within the abdomen extends into an umbilical hernia. There is diffuse free air within the colonic mesenteries. A small hiatal hernia is present. The stomach and duodenum are otherwise within normal limits. The proximal small bowel is unremarkable. Fluid filled loops of bowel in the mid abdomen are noted. The more distal small bowel is collapsed. There is some wall thickening of the terminal ileum at the ileocecal valve. Inflammatory changes surround the ascending colon. No discrete mass lesion is present. Moderate stool is present throughout the colon to the level of a redundant sigmoid. The distal sigmoid and rectum are decompressed. Vascular/Lymphatic: Atherosclerotic calcifications are present in the distal abdominal aorta and branch vessels. No significant adenopathy is present. Reproductive: Within normal limits Other: Extensive free air is present. There is no significant free fluid. Musculoskeletal: Bone windows demonstrate mild rightward curvature at the thoracolumbar lumbar junction. A remote superior endplate fractures present at L5. No other focal osseous lesions are present. The pelvis is intact. IMPRESSION: 1. Pneumoperitoneum. 2. Moderate stool throughout the colon involving a a redundant sigmoid without focal obstruction or mass. 3. Mild inflammatory changes at the level the cecum and ileocecal valve without focal obstruction or mass. 4. The distal sigmoid and rectum are collapsed. 5.  Atherosclerosis. 6. Bibasilar airspace disease is slightly greater than expected for atelectasis. Infection or aspiration is not excluded, particularly at the left base. These results were called by telephone at the time of interpretation on 04/25/2016 at 5:22 pm to Dr. Addison Lank , who verbally acknowledged these results. Electronically Signed   By: San Morelle M.D.   On: 04/25/2016 17:22   Dg Chest Port 1 View  Result Date: 04/25/2016 CLINICAL DATA:  Postoperative.  Endotracheal tube placement. EXAM: PORTABLE CHEST 1 VIEW COMPARISON:  04/25/2016 FINDINGS: Interval placement of an endotracheal tube with tip measuring 6.1 cm above the carina. Heart size and pulmonary vascularity are normal for technique. Shallow inspiration with linear atelectasis in the lung bases. Tortuous aorta. No pneumothorax. IMPRESSION: Endotracheal tube tip measures 6.9 cm above the carina. Electronically Signed   By: Lucienne Capers M.D.   On: 04/25/2016 23:36     STUDIES:  8/14 CT abd/pelvis >> pneumoperitoneum, atherosclerosis  CULTURES:  ANTIBIOTICS: 8/15 Zosyn >> 8/15 Eraxis >>   SIGNIFICANT EVENTS: 8/14 Admitted, laparotomy  LINES/TUBES: ETT 8/14 >>   DISCUSSION: 65 yo male with perforated sigmoid colon s/p laparotomy with sigmoid colectomy and colostomy.  Remained on vent post-op.  ASSESSMENT / PLAN:  PULMONARY A: Acute hypoxemic resp failure 2/2 unable to protect  the airway.   P:   Per surgery, no further surgery contemplated. Plan for PST > anticipate we can extubate him today. Doing good volumes on PST earlier.   CARDIOVASCULAR A:  Hx of HTN. P:  Prn labetalol for SBP > 170  RENAL A:   Oliguria > this has improved.  P:   Continue IV fluids > will decrease from 125/hr to 75/hr.   GASTROINTESTINAL A:   Perforated sigmoid colon. P:   Post-op care, nutrition per CCS Pepcid for SUP while on vent   HEMATOLOGIC A:   Leukocytosis. P:  F/u CBC Lovenox for DVT  prophylaxis  INFECTIOUS A:   Perforated sigmoid colon. P:   Zosyn, eraxis for now. F/U cultures.   ENDOCRINE A:   Hyperglycemia, mild >> no hx of DM. P:   Monitor blood sugar on BMET  NEUROLOGIC A:   Sedation, post-op pain control. P:   RASS goal: 0 to -1 while intubated.  PAD protocol Once extubated, will d/c propofol. Judicious use of fentanyl once extubated.  We dont want to give a lot of fentanyl and he will not be able to protect his airway.   CC time 30 minutes.   Plan extensively discussed with patient and his wife.  Monica Becton, MD 04/26/2016, 10:01 AM [place] Pager [phone] After 3 pm or if no answer, call [phone]

## 2016-04-27 DIAGNOSIS — K631 Perforation of intestine (nontraumatic): Secondary | ICD-10-CM

## 2016-04-27 DIAGNOSIS — K659 Peritonitis, unspecified: Secondary | ICD-10-CM

## 2016-04-27 LAB — BASIC METABOLIC PANEL
ANION GAP: 5 (ref 5–15)
BUN: 12 mg/dL (ref 6–20)
CO2: 26 mmol/L (ref 22–32)
Calcium: 8.3 mg/dL — ABNORMAL LOW (ref 8.9–10.3)
Chloride: 106 mmol/L (ref 101–111)
Creatinine, Ser: 1.02 mg/dL (ref 0.61–1.24)
GFR calc Af Amer: >60 mL/min (ref >60)
GFR calc non Af Amer: >60 mL/min (ref >60)
GLUCOSE: 147 mg/dL — AB (ref 65–99)
POTASSIUM: 4.3 mmol/L (ref 3.5–5.1)
Sodium: 137 mmol/L (ref 135–145)

## 2016-04-27 LAB — CBC
HEMATOCRIT: 39.3 % (ref 39.0–52.0)
HEMOGLOBIN: 13.6 g/dL (ref 13.0–17.0)
MCH: 33 pg (ref 26.0–34.0)
MCHC: 34.6 g/dL (ref 30.0–36.0)
MCV: 95.4 fL (ref 78.0–100.0)
Platelets: 169 10*3/uL (ref 150–400)
RBC: 4.12 MIL/uL — ABNORMAL LOW (ref 4.22–5.81)
RDW: 13.4 % (ref 11.5–15.5)
WBC: 9.2 10*3/uL (ref 4.0–10.5)

## 2016-04-27 LAB — PHOSPHORUS: Phosphorus: 1.8 mg/dL — ABNORMAL LOW (ref 2.5–4.6)

## 2016-04-27 LAB — MAGNESIUM: Magnesium: 2.1 mg/dL (ref 1.7–2.4)

## 2016-04-27 MED ORDER — HYDROMORPHONE 1 MG/ML IV SOLN
INTRAVENOUS | Status: DC
  Administered 2016-04-27 (×2): 0.6 mg via INTRAVENOUS
  Administered 2016-04-27: 1.1 mg via INTRAVENOUS
  Administered 2016-04-27: 11:00:00 via INTRAVENOUS
  Administered 2016-04-28: 0.3 mg via INTRAVENOUS
  Administered 2016-04-28: 1.2 mg via INTRAVENOUS
  Administered 2016-04-28: 0.6 mg via INTRAVENOUS
  Filled 2016-04-27: qty 25

## 2016-04-27 MED ORDER — DIPHENHYDRAMINE HCL 12.5 MG/5ML PO ELIX: 12.5000 mg | ORAL_SOLUTION | Freq: Four times a day (QID) | ORAL | Status: DC | PRN

## 2016-04-27 MED ORDER — FENTANYL CITRATE (PF) 100 MCG/2ML IJ SOLN: 12.5000 ug | INTRAMUSCULAR | Status: AC | PRN

## 2016-04-27 MED ORDER — ONDANSETRON HCL 4 MG/2ML IJ SOLN: 4.0000 mg | Freq: Four times a day (QID) | INTRAMUSCULAR | Status: DC | PRN

## 2016-04-27 MED ORDER — NALOXONE HCL 0.4 MG/ML IJ SOLN: 0.4000 mg | INTRAMUSCULAR | Status: DC | PRN

## 2016-04-27 MED ORDER — SODIUM CHLORIDE 0.9% FLUSH: 9.0000 mL | INTRAVENOUS | Status: DC | PRN

## 2016-04-27 MED ORDER — DIPHENHYDRAMINE HCL 50 MG/ML IJ SOLN: 12.5000 mg | Freq: Four times a day (QID) | INTRAMUSCULAR | Status: DC | PRN

## 2016-04-27 NOTE — Progress Notes (Signed)
Pharmacy Antibiotic Note  [name] Brown is a 65 y.o. male admitted on 04/25/2016 with sigmoid tear with perforation s/p exp sigmoid colectomy, colostomy.  Pt extubated and transferred to floor 8/16.    D3 Abx -Renal fxn stable -WBC improved to WNL -Cultures negative so far   Plan:  Continue Zosyn 3.375gm IV q8h (each dose infused over 4 hrs)  Eraxis 200mg  IV x 1 followed by 100mg  IV q24h  Dosage remains stable and need for further dosage adjustment appears unlikely at present.    Will sign off at this time.  Please reconsult if a change in clinical status warrants re-evaluation of dosage.  Thank you for allowing pharmacy to be a part of this patient's care.  Ralene Bathe, PharmD, BCPS 04/27/2016, 12:20 PM  Pager: [phone]     Height: 5\' 7"  (170.2 cm) Weight: 173 lb 8 oz (78.7 kg) IBW/kg (Calculated) : 66.1  Temp (24hrs), Avg:98.5 F (36.9 C), Min:98.2 F (36.8 C), Max:99 F (37.2 C)   Recent Labs Lab 04/25/16 1459 04/26/16 0051 04/27/16 0332  WBC 14.5* 11.8* 9.2  CREATININE 1.10 0.89 1.02    Estimated Creatinine Clearance: 67.5 mL/min (by C-G formula based on SCr of 1.02 mg/dL).    No Known Allergies  Antimicrobials this admission: 8/14 Zosyn >>   8/51 Eraxis >>    Dose adjustments this admission:    Microbiology results: 8/14 MRSA PCR: negative 8/15 Blood Cx x2: collected

## 2016-04-27 NOTE — Progress Notes (Signed)
2 Days Post-Op  Subjective: He looks fine, has not been out of bed yet.  He is getting Fentanyl every hour for pain.  Ask about a PCA.  sITE ok, I am not sure the umbilical site that has staples will remain closed.  Skin edges are rather dusky.  Sweat in ostomy.  Objective: Vital signs in last 24 hours: Temp:  [98.2 F (36.8 C)-99 F (37.2 C)] 98.6 F (37 C) (08/16 0710) Pulse Rate:  [46-101] 83 (08/16 0830) Resp:  [10-21] 16 (08/16 0830) BP: (110-183)/(50-105) 129/80 (08/16 0830) SpO2:  [94 %-100 %] 96 % (08/16 0830) FiO2 (%):  [30 %] 30 % (08/15 1055)  NPO Urine 1170 Afebrile, VSS Labs OK, WBC is normal    Intake/Output from previous day: 08/15 0701 - 08/16 0700 In: 1735.4 [I.V.:1485.4; IV Piggyback:250] Out: 1170 [Urine:1170] Intake/Output this shift: Total I/O In: 375 [I.V.:375] Out: -   General appearance: alert, cooperative and no distress Resp: clear to auscultation bilaterally GI: soft, no BS, ostomy looks OK, sweat in bag, nothing else, open wound OK,, skin edges of the closed mid section is dusky.    Lab Results:   Recent Labs  04/26/16 0051 04/27/16 0332  WBC 11.8* 9.2  HGB 16.5 13.6  HCT 48.3 39.3  PLT 176 169    BMET  Recent Labs  04/26/16 0051 04/27/16 0332  NA 135 137  K 4.3 4.3  CL 105 106  CO2 24 26  GLUCOSE 146* 147*  BUN 12 12  CREATININE 0.89 1.02  CALCIUM 8.2* 8.3*   PT/INR  Recent Labs  04/25/16 1812  LABPROT 14.9  INR 1.16     Recent Labs Lab 04/25/16 1459  AST 27  ALT 30  ALKPHOS 50  BILITOT 1.4*  PROT 7.7  ALBUMIN 4.8     Lipase     Component Value Date/Time   LIPASE 16 04/25/2016 1459     Studies/Results: Dg Chest 2 View  Result Date: 04/25/2016 CLINICAL DATA:  Pneumoperitoneum.  Preoperative chest x-ray. EXAM: CHEST  2 VIEW COMPARISON:  CT abdomen and pelvis from the same day. FINDINGS: The heart size is normal. Pneumoperitoneum is present. Bibasilar airspace opacities are again noted. The upper lung  fields are clear. The visualized soft tissues and bony thorax are unremarkable. IMPRESSION: 1. Pneumoperitoneum. 2. Bibasilar airspace opacities are similar to the for are studied. These likely reflect atelectasis associated with diaphragmatic irritation. Electronically Signed   By: San Morelle M.D.   On: 04/25/2016 19:14   Ct Abdomen Pelvis W Contrast  Result Date: 04/25/2016 CLINICAL DATA:  Generalized abdominal pain for 1 day. Constipation. Severe pain after large bowel movement yesterday and severe pain while attempting to have bowel movement today. EXAM: CT ABDOMEN AND PELVIS WITH CONTRAST TECHNIQUE: Multidetector CT imaging of the abdomen and pelvis was performed using the standard protocol following bolus administration of intravenous contrast. CONTRAST:  185mL ISOVUE-300 IOPAMIDOL (ISOVUE-300) INJECTION 61% COMPARISON:  None. FINDINGS: Lower chest: Bibasilar airspace disease likely reflects atelectasis. There is some consolidation on the left which could represent early infection or aspiration. The heart size is normal. Calcifications are present at the aortic valves. Hepatobiliary: No focal hepatic lesions are present. Liver contour is within normal limits. Common bile duct is normal. Small layering stones are present at the neck of the gallbladder without evidence for inflammation. Pancreas: No significant inflammatory changes are present. No focal solid or cystic mass lesion is present. The pancreatic duct is within normal limits. Spleen: Unremarkable  Adrenals/Urinary Tract: The adrenal glands are normal bilaterally. Kidneys and ureters are within normal limits. Urinary bladder is mostly collapsed. Stomach/Bowel: Free air a is layering within the abdomen extends into an umbilical hernia. There is diffuse free air within the colonic mesenteries. A small hiatal hernia is present. The stomach and duodenum are otherwise within normal limits. The proximal small bowel is unremarkable. Fluid filled  loops of bowel in the mid abdomen are noted. The more distal small bowel is collapsed. There is some wall thickening of the terminal ileum at the ileocecal valve. Inflammatory changes surround the ascending colon. No discrete mass lesion is present. Moderate stool is present throughout the colon to the level of a redundant sigmoid. The distal sigmoid and rectum are decompressed. Vascular/Lymphatic: Atherosclerotic calcifications are present in the distal abdominal aorta and branch vessels. No significant adenopathy is present. Reproductive: Within normal limits Other: Extensive free air is present. There is no significant free fluid. Musculoskeletal: Bone windows demonstrate mild rightward curvature at the thoracolumbar lumbar junction. A remote superior endplate fractures present at L5. No other focal osseous lesions are present. The pelvis is intact. IMPRESSION: 1. Pneumoperitoneum. 2. Moderate stool throughout the colon involving a a redundant sigmoid without focal obstruction or mass. 3. Mild inflammatory changes at the level the cecum and ileocecal valve without focal obstruction or mass. 4. The distal sigmoid and rectum are collapsed. 5. Atherosclerosis. 6. Bibasilar airspace disease is slightly greater than expected for atelectasis. Infection or aspiration is not excluded, particularly at the left base. These results were called by telephone at the time of interpretation on 04/25/2016 at 5:22 pm to Dr. Addison Lank , who verbally acknowledged these results. Electronically Signed   By: San Morelle M.D.   On: 04/25/2016 17:22   Dg Chest Port 1 View  Result Date: 04/25/2016 CLINICAL DATA:  Postoperative.  Endotracheal tube placement. EXAM: PORTABLE CHEST 1 VIEW COMPARISON:  04/25/2016 FINDINGS: Interval placement of an endotracheal tube with tip measuring 6.1 cm above the carina. Heart size and pulmonary vascularity are normal for technique. Shallow inspiration with linear atelectasis in the lung  bases. Tortuous aorta. No pneumothorax. IMPRESSION: Endotracheal tube tip measures 6.9 cm above the carina. Electronically Signed   By: Lucienne Capers M.D.   On: 04/25/2016 23:36    Medications: . anidulafungin  100 mg Intravenous Q24H  . antiseptic oral rinse  7 mL Mouth Rinse BID  . enoxaparin (LOVENOX) injection  40 mg Subcutaneous Q24H  . famotidine (PEPCID) IV  20 mg Intravenous Q12H  . piperacillin-tazobactam (ZOSYN)  IV  3.375 g Intravenous Q8H   . dextrose 5 % and 0.45 % NaCl with KCl 20 mEq/L 75 mL/hr at 04/27/16 0800  . propofol (DIPRIVAN) infusion Stopped (04/26/16 1015)    Hypertension Hx of melanoma  Assessment/Plan Perforated distal sigmoid colon (stercoral)with fecal peritonitis. (8 cm tear) Exploratory laparotomy with sigmoid colectomy, descending colostomy, Dr. Armandina Gemma 04/25/16 POD 1 Chronic constipation FEN: NPO/IV fluids ID: Zosyn/andiulafungin day 3 DVT:  SCD/Lovenox   Plan:  Transfer to floor, mobilize, d/c foley, PCA for pain.       LOS: 2 days    [name] Brown 04/27/2016 [phone]

## 2016-04-27 NOTE — Progress Notes (Signed)
PULMONARY / CRITICAL CARE MEDICINE   Name: [name] Brown MRN: [id number] DOB: 1951/04/22    ADMISSION DATE:  04/25/2016 CONSULTATION DATE:  04/26/2016  REFERRING MD:  Dr. Harlow Asa, CCS  CHIEF COMPLAINT: Abdominal pain.  HISTORY OF PRESENT ILLNESS:   65 yo male had constipation and then developed severe, generalized abdominal pain.  He did not have fever, chills, nausea, vomiting, or bleeding.  He was found to have perforated distal sigmoid colon.  He had laparotomy with sigmoid colectomy and colostomy.  He remained on vent post-op.   SUBJECTIVE:  Doing well overnight. Tolerating extubation.  (-) n/v/cp/sob. (+) abd pain.   VITAL SIGNS: BP 129/80   Pulse 83   Temp 98.6 F (37 C) (Oral)   Resp 16   Ht 5\' 7"  (1.702 m)   Wt 78.7 kg (173 lb 8 oz)   SpO2 96%   BMI 27.17 kg/m   HEMODYNAMICS:    VENTILATOR SETTINGS: Vent Mode: CPAP;PSV FiO2 (%):  [30 %] 30 % PEEP:  [5 cmH20] 5 cmH20 Pressure Support:  [5 cmH20] 5 cmH20  INTAKE / OUTPUT: I/O last 3 completed shifts: In: 5698.3 [I.V.:4880.3; IV [id number] Out: 2115 [Urine:2015; Blood:100]  PHYSICAL EXAMINATION: General:  Awake, oriented x 3,  Neuro:  CN intact. (-) lateralizing signs HEENT:  PERLA, (-) NVD. (-) oral thrush Cardiovascular:  Regular, no murmur Lungs:  No wheeze  Good ae. (-) crackles/rhonchi Abdomen:  Wound dressing clean, colostomy in LLQ Musculoskeletal:  No edema Skin:  No rashes  LABS:  BMET  Recent Labs Lab 04/25/16 1459 04/26/16 0051 04/27/16 0332  NA 135 135 137  K 4.1 4.3 4.3  CL 99* 105 106  CO2 26 24 26   BUN 15 12 12   CREATININE 1.10 0.89 1.02  GLUCOSE 129* 146* 147*    Electrolytes  Recent Labs Lab 04/25/16 1459 04/26/16 0051 04/27/16 0332  CALCIUM 9.2 8.2* 8.3*  MG  --  1.6* 2.1  PHOS  --   --  1.8*    CBC  Recent Labs Lab 04/25/16 1459 04/26/16 0051 04/27/16 0332  WBC 14.5* 11.8* 9.2  HGB 16.7 16.5 13.6  HCT 48.3 48.3 39.3  PLT 168 176 169     Coag's  Recent Labs Lab 04/25/16 1812  INR 1.16    Sepsis Markers No results for input(s): LATICACIDVEN, PROCALCITON, O2SATVEN in the last 168 hours.  ABG  Recent Labs Lab 04/25/16 2330 04/26/16 0500  PHART 7.339* 7.357  PCO2ART 46.0* 42.7  PO2ART 113* 117*    Liver Enzymes  Recent Labs Lab 04/25/16 1459  AST 27  ALT 30  ALKPHOS 50  BILITOT 1.4*  ALBUMIN 4.8    Cardiac Enzymes No results for input(s): TROPONINI, PROBNP in the last 168 hours.  Glucose  Recent Labs Lab 04/26/16 0001  GLUCAP 134*    Imaging No results found.   STUDIES:  8/14 CT abd/pelvis >> pneumoperitoneum, atherosclerosis  CULTURES:  ANTIBIOTICS: 8/15 Zosyn >> 8/15 Eraxis >>   SIGNIFICANT EVENTS: 8/14 Admitted, laparotomy 8/15 extubated.   LINES/TUBES: ETT 8/14 >> 8/15  DISCUSSION: 65 yo male with perforated sigmoid colon s/p laparotomy with sigmoid colectomy and colostomy.  Remained on vent post-op.  ASSESSMENT / PLAN:  PULMONARY A: Acute hypoxemic resp failure 2/2 unable to protect the airway. S/P Extubation on 8/15.   P:   Doing well post extubation. Incentive spirometry. DuoNeb when necessary.  CARDIOVASCULAR A:  Hx of HTN. P:  Prn labetalol for SBP > 170  RENAL A:  No issues P:   Continue IV fluids > suggest to DC once on nutrition.  GASTROINTESTINAL A:   Perforated sigmoid colon. P:   Post-op care, nutrition per CCS Pepcid for SUP while on vent   HEMATOLOGIC A:   Leukocytosis. P:  F/u CBC Lovenox for DVT prophylaxis  INFECTIOUS A:   Perforated sigmoid colon. P:   Zosyn, eraxis for now. F/U cultures. Negative so far.  ENDOCRINE A:   Hyperglycemia, mild >> no hx of DM. P:   Monitor blood sugar on BMET  NEUROLOGIC A:   Sedation, post-op pain control. P:   RASS goal:0 Agree with fentanyl PCA if he is not covered with pushes of fentanyl every hour. Watch out for sedation.  PCCM will sign off for now. Call back if with  issues.   Monica Becton, MD 04/27/2016, 10:18 AM [facility] Pager [phone] After 3 pm or if no answer, call [phone]

## 2016-04-27 NOTE — Progress Notes (Signed)
Nutrition Follow-up  DOCUMENTATION CODES:   Not applicable  INTERVENTION:  - Diet advancement as medically feasible. - RD will monitor for needs with diet advancement versus need for nutrition support. - RD will follow-up 8/18.  NUTRITION DIAGNOSIS:   Inadequate oral intake related to inability to eat as evidenced by NPO status. 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 No hx of diverticular disease.  No prior abdominal surgery.  Long-standing umbilcal hernia.  Never had colonoscopy.  Attempted to have BM today without success but with recurrence of pain.  8/16 Pt extubated 8/15 at ~1130 and remains NPO at this time. Per Surgery note this AM, pt to transfer out of ICU/SDU today. Estimated nutrition needs updated based on extubation with consideration for POD #2.  Unable to meet needs at this time. Will monitor for diet advancement versus need for nutrition support and will follow-up 8/18.  Medications reviewed. Labs reviewed; Ca: 8.3 mg/dL, Phos: 1.8 mg/dL.  IVF: D5-1/2 NS-20 mEq KCl @ 75 mL/hr (306 kcal).   8/15 - Pt underwent emergent ex lap with sigmoid colectomy and colostomy 04/25/16.  - Pt remains on the vent at time of visit this AM but is able to nod/shake head.  - Notes indicate that PTA pt had experienced abdominal pain with BMs.  - Family and pt deny any pain related to eating; family member states pt with very good appetite PTA.  - Family and pt also deny any recent weight fluctuations.  - Will monitor for POC per surgery concerning route of nutrition.  - No previous weight hx available in the chart for comparison. - No muscle or fat wasting, no edema at this time time.   - Between IVF and Propofol, pt is currently receiving 634 kcal.  Patient is currently intubated on ventilator support MV: 10.6 L/min Temp (24hrs), Avg:98.6 F (37 C), Min:97.8 F (36.6 C), Max:99.6 F (37.6 C) Propofol: 4.7 ml/hr (124 kcal).  IVF: D5-1/2 NS-20 mEq KCl @ 125 mL/hr (510 kcal) Drip: Propofol @ 10 mcg/kg/min.    Diet Order:  Diet NPO time specified  Skin:  Wound (see comment) (Abdominal incision from 04/25/16)  Last BM:  PTA  Height:   Ht Readings from Last 1 Encounters:  04/25/16 5\' 7"  (1.702 m)    Weight:   Wt Readings from Last 1 Encounters:  04/26/16 173 lb 8 oz (78.7 kg)    Ideal Body Weight:  67.27 kg  BMI:  Body mass index is 27.17 kg/m.  Estimated Nutritional Needs:   Kcal:  1950-2150  Protein:  85-95 grams  Fluid:  2-2.2 L/day  EDUCATION NEEDS:   No education needs identified at this time    [name], MS, RD, LDN Inpatient Clinical Dietitian Pager # [phone] After hours/weekend pager # [phone]

## 2016-04-28 MED ORDER — MORPHINE SULFATE 2 MG/ML IV SOLN
INTRAVENOUS | Status: DC
  Administered 2016-04-28: 14:00:00 via INTRAVENOUS
  Administered 2016-04-28: 4.5 mg via INTRAVENOUS
  Administered 2016-04-28: 3 mg via INTRAVENOUS
  Administered 2016-04-29: 15 mg via INTRAVENOUS
  Administered 2016-04-29: 09:00:00 via INTRAVENOUS
  Administered 2016-04-29: 12.8 mg via INTRAVENOUS
  Administered 2016-04-29: 7.5 mg via INTRAVENOUS
  Filled 2016-04-28 (×2): qty 25

## 2016-04-28 MED ORDER — SODIUM CHLORIDE 0.9% FLUSH: 9.0000 mL | INTRAVENOUS | Status: DC | PRN

## 2016-04-28 MED ORDER — DIPHENHYDRAMINE HCL 50 MG/ML IJ SOLN: 12.5000 mg | Freq: Four times a day (QID) | INTRAMUSCULAR | Status: DC | PRN

## 2016-04-28 MED ORDER — DIPHENHYDRAMINE HCL 12.5 MG/5ML PO ELIX: 12.5000 mg | ORAL_SOLUTION | Freq: Four times a day (QID) | ORAL | Status: DC | PRN

## 2016-04-28 MED ORDER — ONDANSETRON HCL 4 MG/2ML IJ SOLN: 4.0000 mg | Freq: Four times a day (QID) | INTRAMUSCULAR | Status: DC | PRN

## 2016-04-28 MED ORDER — NALOXONE HCL 0.4 MG/ML IJ SOLN: 0.4000 mg | INTRAMUSCULAR | Status: DC | PRN

## 2016-04-28 NOTE — Progress Notes (Signed)
3 Days Post-Op  Subjective: Saw him up walking earlier today. He says he feels kind of bad and PND he's run low-grade temperatures and 99.8 range. Lungs are clear. Nothing in the ostomy so far aside from some sweat. The incision looks fine. There is a good deal of erythema in the midportion which has been closed with staples.  Objective: Vital signs in last 24 hours: Temp:  [98.2 F (36.8 C)-99.8 F (37.7 C)] 99.7 F (37.6 C) (08/17 0804) Pulse Rate:  [70-96] 96 (08/17 0446) Resp:  [11-22] 22 (08/17 0750) BP: (125-147)/(49-83) 126/49 (08/17 0446) SpO2:  [95 %-100 %] 98 % (08/17 0750)    Npo Urine 1875 Stool: None Afebrile vital signs are stable Labs all look good, glucose is slightly elevated.  Intake/Output from previous day: 08/16 0701 - 08/17 0700 In: 2460 [I.V.:1950; IV Piggyback:510] Out: 1875 [Urine:1875] Intake/Output this shift: Total I/O In: -  Out: 250 [Urine:250]  General appearance: alert, cooperative and no distress Resp: clear to auscultation bilaterally GI: Soft, no bowel sounds. Sweat and the ostomy bag. Ostomy looks fine. The midline is open exception of very middle, this is fairly erythematous.  Lab Results:   Recent Labs  04/26/16 0051 04/27/16 0332  WBC 11.8* 9.2  HGB 16.5 13.6  HCT 48.3 39.3  PLT 176 169    BMET  Recent Labs  04/26/16 0051 04/27/16 0332  NA 135 137  K 4.3 4.3  CL 105 106  CO2 24 26  GLUCOSE 146* 147*  BUN 12 12  CREATININE 0.89 1.02  CALCIUM 8.2* 8.3*   PT/INR  Recent Labs  04/25/16 1812  LABPROT 14.9  INR 1.16     Recent Labs Lab 04/25/16 1459  AST 27  ALT 30  ALKPHOS 50  BILITOT 1.4*  PROT 7.7  ALBUMIN 4.8     Lipase     Component Value Date/Time   LIPASE 16 04/25/2016 1459     Studies/Results: No results found.  Medications: . anidulafungin  100 mg Intravenous Q24H  . antiseptic oral rinse  7 mL Mouth Rinse BID  . enoxaparin (LOVENOX) injection  40 mg Subcutaneous Q24H  . famotidine  (PEPCID) IV  20 mg Intravenous Q12H  . HYDROmorphone   Intravenous Q4H  . piperacillin-tazobactam (ZOSYN)  IV  3.375 g Intravenous Q8H   . dextrose 5 % and 0.45 % NaCl with KCl 20 mEq/L 75 mL/hr at 04/28/16 0600  . propofol (DIPRIVAN) infusion Stopped (04/26/16 1015)    Hypertension Hx of melanoma  Assessment/Plan Perforated distal sigmoid colon (stercoral)with fecal peritonitis. (8 cm tear) Exploratory laparotomy with sigmoid colectomy, descending colostomy, Dr. Armandina Gemma 04/25/16 POD 3 Chronic constipation FEN: NPO/IV fluids =>  Ice chips ID: Zosyn/andiulafungin day 4 DVT: SCD/Lovenox   Plan: Discussed with Dr. Harlow Asa and he will see the patient. Ongoing ileus, pain control per PCA. We'll let him have some ice chips.   LOS: 3 days    [name],[name] 04/28/2016 [facility]  Patient seen and examined.  Wound with slight erythema around area of umbilicus.  Packing in place above and below.  Would leave staples in place for now and observe.  Stoma pink and viable.  No output yet.  Encouraged ambulation.  Continue Zosyn IV.  Patient requests changing Dilaudid to Morphine PCA.  Will order.  Earnstine Regal, MD, [facility] Office: [phone]

## 2016-04-28 NOTE — Consult Note (Signed)
[facility] Nurse ostomy consult note Stoma type/location: LLQ Colostomy Stomal assessment/size: 1 and 3/4 inch round, red, moist, edematous Peristomal assessment: intact, clear Treatment options for stomal/peristomal skin: skin barrier ring Output: none, sweat in pouch Ostomy pouching: 2pc. 2 and 3/4 inch pouching system Education provided: Patient and wife taught GI A&P, pouch and stoma characteristics. Demonstrated pouch removal, stoma sizing, pouch preparation and application.  Patient able to give return demonstration of Lock and Roll closure.  Wife was initially hesitant, then able to view ostomy.Patient is still on PCA and drowsy during session.  Asking questions about supplies, HHRN.  Taught about [employer], patient and wife agreeable to program. Taught that patient may shower with ostomy pouch on or off.  Taught that pouch changes are twice weekly and emptying is whenever pouch is 1/3 to 1/2 full of stool or flatus. Enrolled patient in [place] program: Yes [place] nursing team will follow, and will remain available to this patient, the nursing, surgical and medical teams.  Thanks, Maudie Flakes, MSN, RN, [place], Arther Abbott  Pager# [phone]

## 2016-04-28 NOTE — Care Management Note (Signed)
Case Management Note  Patient Details  Name: [name] [name] MRN: [id number] Date of Birth: Apr 25, 1951  Subjective/Objective:                  s/p exp sigmoid colectomy, colostomy Action/Plan: Discharge planning Expected Discharge Date:   (unknown)               Expected Discharge Plan:  [place]  In-House Referral:     Discharge planning Services  CM Consult  Post Acute Care Choice:  Home Health Choice offered to:  Patient  DME Arranged:  N/A DME Agency:  [place] Arranged:  RN [facility] Agency:  [place]  Status of Service:  Completed, signed off  If discussed at [place] of Stay Meetings, dates discussed:    Additional Comments: CM met with pt in room to offer choice of home health agency.  Wife in room.  Family choose [facility] to render [facility] for dressing changes and new ostomy.  Pt denies Need for DME.  Referral called to [facility] rep, Santiago Glad.  Orders and F2F requested.  Will follow for orders. Dellie Catholic, RN 04/28/2016, 9:28 AM

## 2016-04-29 DIAGNOSIS — K633 Ulcer of intestine: Secondary | ICD-10-CM

## 2016-04-29 DIAGNOSIS — K420 Umbilical hernia with obstruction, without gangrene: Secondary | ICD-10-CM

## 2016-04-29 LAB — TRIGLYCERIDES: Triglycerides: 238 mg/dL — ABNORMAL HIGH (ref <150)

## 2016-04-29 MED ORDER — VITAMIN C 500 MG PO TABS
500.0000 mg | ORAL_TABLET | Freq: Two times a day (BID) | ORAL | Status: DC
  Administered 2016-04-29 – 2016-05-03 (×9): 500 mg via ORAL
  Filled 2016-04-29 (×9): qty 1

## 2016-04-29 MED ORDER — PROCHLORPERAZINE EDISYLATE 5 MG/ML IJ SOLN: 5.0000 mg | INTRAMUSCULAR | Status: DC | PRN

## 2016-04-29 MED ORDER — MENTHOL 3 MG MT LOZG: 1.0000 | LOZENGE | OROMUCOSAL | Status: DC | PRN

## 2016-04-29 MED ORDER — SACCHAROMYCES BOULARDII 250 MG PO CAPS
250.0000 mg | ORAL_CAPSULE | Freq: Two times a day (BID) | ORAL | Status: DC
  Administered 2016-04-29 – 2016-05-03 (×9): 250 mg via ORAL
  Filled 2016-04-29 (×9): qty 1

## 2016-04-29 MED ORDER — LACTATED RINGERS IV BOLUS (SEPSIS)
1000.0000 mL | Freq: Once | INTRAVENOUS | Status: AC
  Administered 2016-04-29: 1000 mL via INTRAVENOUS

## 2016-04-29 MED ORDER — SODIUM CHLORIDE 0.9% FLUSH: 3.0000 mL | INTRAVENOUS | Status: DC | PRN

## 2016-04-29 MED ORDER — ACETAMINOPHEN 500 MG PO TABS
1000.0000 mg | ORAL_TABLET | Freq: Three times a day (TID) | ORAL | Status: DC
  Administered 2016-04-29 – 2016-05-02 (×11): 1000 mg via ORAL
  Filled 2016-04-29 (×12): qty 2

## 2016-04-29 MED ORDER — BOOST / RESOURCE BREEZE PO LIQD
1.0000 | Freq: Three times a day (TID) | ORAL | Status: DC
  Administered 2016-04-29: 1 via ORAL

## 2016-04-29 MED ORDER — ALUM & MAG HYDROXIDE-SIMETH 200-200-20 MG/5ML PO SUSP: 30.0000 mL | Freq: Four times a day (QID) | ORAL | Status: DC | PRN

## 2016-04-29 MED ORDER — LACTATED RINGERS IV BOLUS (SEPSIS): 1000.0000 mL | Freq: Three times a day (TID) | INTRAVENOUS | Status: AC | PRN

## 2016-04-29 MED ORDER — LIP MEDEX EX OINT
1.0000 "application " | TOPICAL_OINTMENT | Freq: Two times a day (BID) | CUTANEOUS | Status: DC
  Administered 2016-04-29 – 2016-05-03 (×8): 1 via TOPICAL
  Filled 2016-04-29: qty 7

## 2016-04-29 MED ORDER — PHENOL 1.4 % MT LIQD: 2.0000 | OROMUCOSAL | Status: DC | PRN

## 2016-04-29 MED ORDER — METHOCARBAMOL 1000 MG/10ML IJ SOLN
1000.0000 mg | Freq: Four times a day (QID) | INTRAMUSCULAR | Status: DC | PRN
  Filled 2016-04-29: qty 10

## 2016-04-29 MED ORDER — DIPHENHYDRAMINE HCL 50 MG/ML IJ SOLN: 12.5000 mg | Freq: Four times a day (QID) | INTRAMUSCULAR | Status: DC | PRN

## 2016-04-29 MED ORDER — FENTANYL CITRATE (PF) 100 MCG/2ML IJ SOLN
25.0000 ug | INTRAMUSCULAR | Status: DC | PRN
  Administered 2016-04-29 – 2016-05-01 (×7): 50 ug via INTRAVENOUS
  Filled 2016-04-29 (×7): qty 2

## 2016-04-29 MED ORDER — METOCLOPRAMIDE HCL 5 MG/ML IJ SOLN
5.0000 mg | Freq: Four times a day (QID) | INTRAMUSCULAR | Status: DC | PRN
Start: 2016-04-29 — End: 2016-05-03

## 2016-04-29 MED ORDER — SODIUM CHLORIDE 0.9% FLUSH
3.0000 mL | Freq: Two times a day (BID) | INTRAVENOUS | Status: DC
  Administered 2016-04-29 – 2016-05-01 (×5): 3 mL via INTRAVENOUS

## 2016-04-29 MED ORDER — MAGIC MOUTHWASH
15.0000 mL | Freq: Four times a day (QID) | ORAL | Status: DC | PRN
  Filled 2016-04-29: qty 15

## 2016-04-29 MED ORDER — SODIUM CHLORIDE 0.9 % IV SOLN: 250.0000 mL | INTRAVENOUS | Status: DC | PRN

## 2016-04-29 NOTE — Progress Notes (Signed)
Nutrition Follow-up  DOCUMENTATION CODES:   Not applicable  INTERVENTION:  -Boost Freer po TID, each supplement provides 250 kcal and 9 grams of protein -RD to continue to monitor  NUTRITION DIAGNOSIS:   Inadequate oral intake related to inability to eat as evidenced by NPO status. 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 No hx of diverticular disease.  No prior abdominal surgery.  Long-standing umbilcal hernia.  Never had colonoscopy.  Attempted to have BM today without success but with recurrence of pain.  Was resting during my visit. Said he is feeling better Consumed about 50% of his lunch tray, appetite coming back Denied nausea/vomiting Labs and medications reviewed: Tot Bili 1.4 Vit C  Diet Order:  Diet clear liquid Room service appropriate? Yes; Fluid consistency: Thin  Skin:  Wound (see comment) (Abdominal incision from 04/25/16)  Last BM:  PTA  Height:   Ht Readings from Last 1 Encounters:  04/25/16 5\' 7"  (1.702 m)    Weight:   Wt Readings from Last 1 Encounters:  04/26/16 173 lb 8 oz (78.7 kg)    Ideal Body Weight:  67.27 kg  BMI:  Body mass index is 27.17 kg/m.  Estimated Nutritional Needs:   Kcal:  1950-2150  Protein:  85-95 grams  Fluid:  2-2.2 L/day  EDUCATION NEEDS:   No education needs identified at this time  Satira Anis. Jamaira Sherk, MS, RD LDN Inpatient Clinical Dietitian Pager [phone]

## 2016-04-29 NOTE — Progress Notes (Signed)
[name]  Brown., [name], [name] Phone: [phone] FAX: [phone]   [name] [id number] [birth year]  CARE TEAM:  PCP: No primary care provider on file.  Outpatient Care Team: No care team member to display  Inpatient Treatment Team: Treatment Team: Attending Provider: [name], MD; Technician: Sueanne Margarita, NT; Registered Nurse: Corbin Ade, RN  Problem List:   Principal Problem:   Perforated sigmoid colon s/p colectomy/colostomy 04/26/2016 Active Problems:   Acute hypoxemic respiratory failure ([place])   Bowel perforation ([place])   Peritonitis ([place])   Stercoral ulcer of large intestine   4 Days Post-Op  04/25/2016  OPERATIVE REPORT  POSTOPERATIVE DIAGNOSIS:  Perforated distal sigmoid colon (stercoral) with fecal peritonitis.  PROCEDURES: 1. Exploratory laparotomy. 2. Sigmoid colectomy. 3. Descending colostomy.  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  CT scan of the abdomen and pelvis shows extensive free air within the peritoneal cavity.  General Surgery was consulted and the patient was prepared urgently and brought to the operating room for exploration.  Diagnosis Colon, segmental resection, sigmoid - PERFORATION WITH SEROSITIS. - NO DYSPLASIA OR MALIGNANCY. [name] MD Pathologist, Electronic Signature (Case signed 04/27/2016) Specimen [name] and Clinical Information Specimen(s) Obtained: Colon, segmental resection, sigmoid Specimen Clinical Information perforated colon (kp) Savian [name] Specimen: Received in formalin labeled sigmoid colon. Length: 32.5 cm one stapled and one open resection margin. Per the requisition, the open end is distal. Serosa: Tan pink to gray with attached tan yellow adipose tissue. At  the distal end there is a 2.2 x 1.0 cm full thickness perforation identified. The tissue surrounding the perforation is gray purple with focal exudate. The perforation measures 0.7 cm from the distal resection margin. Contents: The lumen is moderately dilated, and filled with an abundant amount of brown firm fecal material. Mucosa/Wall: The mucosa is tan pink with moderate flattening of the mucosal folds. The mucosa surrounding the defect is gray purple and dusky. No other mucosal lesions are grossly identified. The wall ranges from 0.1 to 0.2 cm in thickness. Lymph nodes: No enlarged lymph nodes are identified. Block Summary: 4 block submitted. A= proximal resection margin. B= distal resection margins C= sections of defect. D= representative mucosa. (KL:gt, 04/26/16)  Assessment  OK  Plan:  -pack wound daily - open completely -wean IVF -d/c PCA - try IV fent q1h PRN -start PO liquids - adv to fulls as tolerated -Chronic constipation - will need bowel regimen once post op ileus resolved -f/u pathology - benign -colostomy care -PRN IVF -ID: Zosyn/andiulafungin day 4/7 -DVT: SCD/Lovenox mobilize as tolerated to help recovery  [name], M.D., F.A.C.S. Gastrointestinal and Minimally Invasive Surgery [facility] [address], [place], [place] [address] [phone] Main / Paging   04/29/2016  Subjective:  Having flatus in bag PCA alarming - morphine sedating (dilaudid confusing) Walking a little Wife in room  Objective:  Vital signs:  Vitals:   04/29/16 0146 04/29/16 0400 04/29/16 0555 04/29/16 0831  BP: 136/65  (!) 146/70   Pulse: 86  88   Resp: 16 20 10 15   Temp: 98.9 F (37.2 C)  98.8 F (37.1 C)   TempSrc: Oral  Oral   SpO2: 95% 95% 96%   Weight:      Height:  Intake/Output   Yesterday:  08/17 0701 - 08/18 0700 In: 601.3 [I.V.:601.3] Out: 1725 [Urine:1725] This shift:  Total I/O In: -   Out: 250 [Urine:250]  Bowel function:  Flatus: YES  BM:  No  Drain: (No drain)   Physical Exam:  General: Pt awake/alert/oriented x4 in No acute distress Eyes: PERRL, normal EOM.  Sclera clear.  No icterus Neuro: CN II-XII intact w/o focal sensory/motor deficits. Lymph: No head/neck/groin lymphadenopathy Psych:  No delerium/psychosis/paranoia HENT: Normocephalic, Mucus membranes moist.  No thrush Neck: Supple, No tracheal deviation Chest: No chest wall pain w good excursion CV:  Pulses intact.  Regular rhythm MS: Normal AROM mjr joints.  No obvious deformity Abdomen: Soft.  Nondistended.  Mildly tender at incisions only.  No evidence of peritonitis.  No incarcerated hernias.  Hematoma at periumb staples.  No pus.  Colostomy pink w scant flatus in bag Ext:  SCDs BLE.  No mjr edema.  No cyanosis Skin: No petechiae / purpura  Results:   Labs: Results for orders placed or performed during the hospital encounter of 04/25/16 (from the past 48 hour(s))  Triglycerides     Status: Abnormal   Collection Time: 04/29/16  2:26 AM  Result Value Ref Range   Triglycerides 238 (H) <150 mg/dL    Comment: Performed at [facility] / Studies: No results found.  Medications / Allergies: per chart  Antibiotics: Anti-infectives    Start     Dose/Rate Route Frequency Ordered Stop   04/27/16 0300  anidulafungin (ERAXIS) 100 mg in sodium chloride 0.9 % 100 mL IVPB     100 mg over 90 Minutes Intravenous Every 24 hours 04/26/16 0207     04/26/16 0300  anidulafungin (ERAXIS) 200 mg in sodium chloride 0.9 % 200 mL IVPB     200 mg over 180 Minutes Intravenous  Once 04/26/16 0207 04/26/16 0617   04/26/16 0200  piperacillin-tazobactam (ZOSYN) IVPB 3.375 g  Status:  Discontinued     3.375 g 12.5 mL/hr over 240 Minutes Intravenous Every 8 hours 04/25/16 1733 04/25/16 2333   04/26/16 0200  piperacillin-tazobactam (ZOSYN) IVPB 3.375 g     3.375 g 12.5 mL/hr over 240 Minutes  Intravenous Every 8 hours 04/25/16 2326     04/25/16 1745  piperacillin-tazobactam (ZOSYN) IVPB 3.375 g     3.375 g 100 mL/hr over 30 Minutes Intravenous STAT 04/25/16 1733 04/25/16 1816        Note: Portions of this report may have been transcribed using voice recognition software. Every effort was made to ensure accuracy; however, inadvertent computerized transcription errors may be present.   Any transcriptional errors that result from this process are unintentional.     [name], M.D., F.A.C.S. Gastrointestinal and Minimally Invasive Surgery [facility] [address] [place], [place] [address] [phone] Main / Paging   04/29/2016

## 2016-04-29 NOTE — Consult Note (Signed)
[facility] Nurse ostomy follow up Stoma type/location: LLQ Colostomy Stomal assessment/size: 1 and 3/4 inch round, red, edematous Peristomal assessment: not seen today Treatment options for stomal/peristomal skin: skin barrier ring in place Output Patient reports feeling a few "bubbles" of gas passing; none appreciable in pouch Ostomy pouching: 2pc. 2 and 3/4 inch ostomy pouching system with skin barrier  Education provided: Patient is very tired and sleeping soundly upon my arrival today.   We talk briefly, I add to the supplies in his supply cupboard (he now has three pouching "set-ups") and provide by business card with mobile number as I promised yesterday. Wife has taken teaching booklet home and patient reports that she will be bringing it back.  I told him I can also provide another. Patient plans to shower this afternoon.  Bedside RN agrees to have patient sit on the toilet and beig to practice with tail closure manipulation before return of bowel function. Enrolled patient in [facility] Start Discharge program: Yes [facility] nursing team will follow, and will remain available to this patient, the nursing, surgical and medical teams.   Thanks, Maudie Flakes, MSN, RN, [place], Arther Abbott  Pager# [phone]

## 2016-04-30 LAB — CBC
HCT: 37.9 % — ABNORMAL LOW (ref 39.0–52.0)
Hemoglobin: 13.2 g/dL (ref 13.0–17.0)
MCH: 32.6 pg (ref 26.0–34.0)
MCHC: 34.8 g/dL (ref 30.0–36.0)
MCV: 93.6 fL (ref 78.0–100.0)
PLATELETS: 222 10*3/uL (ref 150–400)
RBC: 4.05 MIL/uL — ABNORMAL LOW (ref 4.22–5.81)
RDW: 13.2 % (ref 11.5–15.5)
WBC: 7.2 10*3/uL (ref 4.0–10.5)

## 2016-04-30 LAB — BASIC METABOLIC PANEL
Anion gap: 8 (ref 5–15)
BUN: 9 mg/dL (ref 6–20)
CALCIUM: 8.7 mg/dL — AB (ref 8.9–10.3)
CO2: 29 mmol/L (ref 22–32)
Chloride: 103 mmol/L (ref 101–111)
Creatinine, Ser: 1.04 mg/dL (ref 0.61–1.24)
GFR calc Af Amer: >60 mL/min (ref >60)
GLUCOSE: 104 mg/dL — AB (ref 65–99)
Potassium: 3.6 mmol/L (ref 3.5–5.1)
Sodium: 140 mmol/L (ref 135–145)

## 2016-04-30 NOTE — Progress Notes (Signed)
[facility]  [address] [place], [place] [address] Phone: [phone] FAX: [phone]   [name] Brown [id number] 11-Jan-1951  CARE TEAM:  PCP: No primary care provider on file.  Outpatient Care Team: No care team member to display  Inpatient Treatment Team: Treatment Team: Attending Provider: Md Edison Pace, MD; Registered Nurse: Corbin Ade, RN; Technician: Abbe Amsterdam, NT; Technician: Coralie Carpen, NT  Problem List:   Principal Problem:   Perforated sigmoid colon s/p colectomy/colostomy 04/26/2016 Active Problems:   Acute hypoxemic respiratory failure ([place])   Peritonitis ([place])   Stercoral ulcer of large intestine   Incarcerated umbilical hernia   5 Days Post-Op  04/25/2016  OPERATIVE REPORT  POSTOPERATIVE DIAGNOSIS:  Perforated distal sigmoid colon (stercoral) with fecal peritonitis.  PROCEDURES: 1. Exploratory laparotomy. 2. Sigmoid colectomy. 3. Descending colostomy.  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  CT scan of the abdomen and pelvis shows extensive free air within the peritoneal cavity.  General Surgery was consulted and the patient was prepared urgently and brought to the operating room for exploration.  Diagnosis Colon, segmental resection, sigmoid - PERFORATION WITH SEROSITIS. - NO DYSPLASIA OR MALIGNANCY. [name] Males MD Pathologist, Electronic Signature (Case signed 04/27/2016) Specimen [name] Brown and Clinical Information Specimen(s) Obtained: Colon, segmental resection, sigmoid Specimen Clinical Information perforated colon (kp) [name] Brown Specimen: Received in formalin labeled sigmoid colon. Length: 32.5 cm one stapled and one open resection margin. Per the requisition, the open end is distal. Serosa: Tan pink to gray with  attached tan yellow adipose tissue. At the distal end there is a 2.2 x 1.0 cm full thickness perforation identified. The tissue surrounding the perforation is gray purple with focal exudate. The perforation measures 0.7 cm from the distal resection margin. Contents: The lumen is moderately dilated, and filled with an abundant amount of brown firm fecal material. Mucosa/Wall: The mucosa is tan pink with moderate flattening of the mucosal folds. The mucosa surrounding the defect is gray purple and dusky. No other mucosal lesions are grossly identified. The wall ranges from 0.1 to 0.2 cm in thickness. Lymph nodes: No enlarged lymph nodes are identified. Block Summary: 4 block submitted. A= proximal resection margin. B= distal resection margins C= sections of defect. D= representative mucosa. (KL:gt, 04/26/16)  Assessment  Better  Plan:  -pack wound daily - open completely -adv diet as tolerated -Chronic constipation - will need bowel regimen once post op ileus resolved -f/u pathology - benign -colostomy care -PRN IVF -ID: Zosyn - continue 5 days minimum.  Stopped andiulafungin after day 4 -DVT: SCD/Lovenox mobilize as tolerated to help recovery  D/C patient from hospital when patient meets criteria (anticipate in 1-2 day(s)):  Tolerating oral intake well Ambulating well Adequate pain control without IV medications Urinating  Having flatus HH wound & ostomy care & other disposition planning in place   [name], M.D., F.A.C.S. Gastrointestinal and Minimally Invasive Surgery [facility] [address], [place], [place] [address] [phone] Main / Paging   04/30/2016  Subjective:  Having flatus in bag Less pain Tol liquids Walking much more Wife in room  Objective:  Vital signs:  Vitals:   04/29/16 1024 04/29/16 1429 04/29/16 2125 04/30/16 0557  BP: (!) 164/83 137/71 136/61 (!) 120/51  Pulse: 86 80 74 81  Resp:  '16  16 16 16  ' Temp: 97.7 F (36.5 C) 98 F (36.7 C) 98.7 F (37.1 C) 98.6 F (37 C)  TempSrc: Oral Oral Oral Oral  SpO2: 94% 96% 98% 97%  Weight:      Height:        Last BM Date: 04/29/16  Intake/Output   Yesterday:  08/18 0701 - 08/19 0700 In: 450 [IV Piggyback:450] Out: 7510 [Urine:3550] This shift:  No intake/output data recorded.  Bowel function:  Flatus: YES  BM:  No  Drain: (No drain)   Physical Exam:  General: Pt awake/alert/oriented x4 in No acute distress Eyes: PERRL, normal EOM.  Sclera clear.  No icterus Neuro: CN II-XII intact w/o focal sensory/motor deficits. Lymph: No head/neck/groin lymphadenopathy Psych:  No delerium/psychosis/paranoia HENT: Normocephalic, Mucus membranes moist.  No thrush Neck: Supple, No tracheal deviation Chest: No chest wall pain w good excursion CV:  Pulses intact.  Regular rhythm MS: Normal AROM mjr joints.  No obvious deformity Abdomen: Soft.  Nondistended.  Mildly tender at incisions only.  No evidence of peritonitis.  No incarcerated hernias.  Less edema / hematoma at periumb part.  No pus.  Colostomy pink w flatus in bag Ext:  SCDs BLE.  No mjr edema.  No cyanosis Skin: No petechiae / purpura  Results:   Labs: Results for orders placed or performed during the hospital encounter of 04/25/16 (from the past 48 hour(s))  Triglycerides     Status: Abnormal   Collection Time: 04/29/16  2:26 AM  Result Value Ref Range   Triglycerides 238 (H) <150 mg/dL    Comment: Performed at [facility]  CBC     Status: Abnormal   Collection Time: 04/30/16  4:12 AM  Result Value Ref Range   WBC 7.2 4.0 - 10.5 K/uL   RBC 4.05 (L) 4.22 - 5.81 MIL/uL   Hemoglobin 13.2 13.0 - 17.0 g/dL   HCT 37.9 (L) 39.0 - 52.0 %   MCV 93.6 78.0 - 100.0 fL   MCH 32.6 26.0 - 34.0 pg   MCHC 34.8 30.0 - 36.0 g/dL   RDW 13.2 11.5 - 15.5 %   Platelets 222 150 - 400 K/uL  Basic metabolic panel     Status: Abnormal   Collection Time: 04/30/16  4:12 AM   Result Value Ref Range   Sodium 140 135 - 145 mmol/L   Potassium 3.6 3.5 - 5.1 mmol/L   Chloride 103 101 - 111 mmol/L   CO2 29 22 - 32 mmol/L   Glucose, Bld 104 (H) 65 - 99 mg/dL   BUN 9 6 - 20 mg/dL   Creatinine, Ser 1.04 0.61 - 1.24 mg/dL   Calcium 8.7 (L) 8.9 - 10.3 mg/dL   GFR calc non Af Amer >60 >60 mL/min   GFR calc Af Amer >60 >60 mL/min    Comment: (NOTE) The eGFR has been calculated using the CKD EPI equation. This calculation has not been validated in all clinical situations. eGFR's persistently <60 mL/min signify possible Chronic Kidney Disease.    Anion gap 8 5 - 15    Imaging / Studies: No results found.  Medications / Allergies: per chart  Antibiotics: Anti-infectives    Start     Dose/Rate Route Frequency Ordered Stop   04/27/16 0300  anidulafungin (ERAXIS) 100 mg in sodium chloride 0.9 % 100 mL IVPB     100 mg over 90 Minutes Intravenous Every 24 hours 04/26/16 0207 04/30/16 0430   04/26/16 0300  anidulafungin (ERAXIS) 200 mg in sodium chloride 0.9 % 200 mL IVPB     200 mg over 180 Minutes Intravenous  Once 04/26/16 0207 04/26/16 0617   04/26/16 0200  piperacillin-tazobactam (ZOSYN) IVPB 3.375 g  Status:  Discontinued     3.375 g 12.5 mL/hr over 240 Minutes Intravenous Every 8 hours 04/25/16 1733 04/25/16 2333   04/26/16 0200  piperacillin-tazobactam (ZOSYN) IVPB 3.375 g     3.375 g 12.5 mL/hr over 240 Minutes Intravenous Every 8 hours 04/25/16 2326 05/03/16 0159   04/25/16 1745  piperacillin-tazobactam (ZOSYN) IVPB 3.375 g     3.375 g 100 mL/hr over 30 Minutes Intravenous STAT 04/25/16 1733 04/25/16 1816        Note: Portions of this report may have been transcribed using voice recognition software. Every effort was made to ensure accuracy; however, inadvertent computerized transcription errors may be present.   Any transcriptional errors that result from this process are unintentional.     [name], M.D., F.A.C.S. Gastrointestinal and  Minimally Invasive Surgery [facility] [address] [place], [place] [address] [phone] Main / Paging   04/30/2016

## 2016-05-01 ENCOUNTER — Encounter (HOSPITAL_COMMUNITY): Payer: Self-pay | Admitting: Surgery

## 2016-05-01 DIAGNOSIS — I10 Essential (primary) hypertension: Secondary | ICD-10-CM | POA: Diagnosis present

## 2016-05-01 LAB — CULTURE, BLOOD (ROUTINE X 2)
CULTURE: NO GROWTH
Culture: NO GROWTH

## 2016-05-01 MED ORDER — IBUPROFEN 200 MG PO TABS: 400.0000 mg | ORAL_TABLET | Freq: Four times a day (QID) | ORAL | Status: DC | PRN

## 2016-05-01 MED ORDER — POLYETHYLENE GLYCOL 3350 17 G PO PACK
17.0000 g | PACK | Freq: Two times a day (BID) | ORAL | Status: DC
  Administered 2016-05-01 – 2016-05-03 (×5): 17 g via ORAL
  Filled 2016-05-01 (×5): qty 1

## 2016-05-01 MED ORDER — OXYCODONE HCL 5 MG PO TABS
5.0000 mg | ORAL_TABLET | ORAL | Status: DC | PRN
  Administered 2016-05-01: 5 mg via ORAL
  Administered 2016-05-01: 10 mg via ORAL
  Administered 2016-05-01 – 2016-05-02 (×2): 5 mg via ORAL
  Filled 2016-05-01 (×4): qty 1
  Filled 2016-05-01: qty 2
  Filled 2016-05-01: qty 1

## 2016-05-01 MED ORDER — POLYETHYLENE GLYCOL 3350 17 G PO PACK
17.0000 g | PACK | Freq: Two times a day (BID) | ORAL | Status: DC | PRN
  Filled 2016-05-01: qty 1

## 2016-05-01 MED ORDER — FENTANYL CITRATE (PF) 100 MCG/2ML IJ SOLN: 25.0000 ug | INTRAMUSCULAR | Status: DC | PRN

## 2016-05-01 NOTE — Progress Notes (Signed)
[facility]  [address] [place], [place] [address] Phone: [phone] FAX: [phone]   [name] Brown [id number] 12/21/1950  CARE TEAM:  PCP: No primary care provider on file.  Outpatient Care Team: No care team member to display  Inpatient Treatment Team: Treatment Team: Attending Provider: Md Edison Pace, MD; Registered Nurse: Corbin Ade, RN; Technician: Abbe Amsterdam, NT; Technician: Coralie Carpen, NT; Technician: Raylene Everts, NT; Registered Nurse: Janifer Adie, RN  Problem List:   Principal Problem:   Perforated sigmoid colon s/p colectomy/colostomy 04/26/2016 Active Problems:   Acute hypoxemic respiratory failure ([place])   Peritonitis ([place])   Stercoral ulcer of large intestine   Incarcerated umbilical hernia   6 Days Post-Op  04/25/2016  OPERATIVE REPORT  POSTOPERATIVE DIAGNOSIS:  Perforated distal sigmoid colon (stercoral) with fecal peritonitis.  PROCEDURES: 1. Exploratory laparotomy. 2. Sigmoid colectomy. 3. Descending colostomy.  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  CT scan of the abdomen and pelvis shows extensive free air within the peritoneal cavity.  General Surgery was consulted and the patient was prepared urgently and brought to the operating room for exploration.  Diagnosis Colon, segmental resection, sigmoid - PERFORATION WITH SEROSITIS. - NO DYSPLASIA OR MALIGNANCY. [name] MD Pathologist, Electronic Signature (Case signed 04/27/2016) Specimen Kathie Posa and Clinical Information Specimen(s) Obtained: Colon, segmental resection, sigmoid Specimen Clinical Information perforated colon (kp) Kolton Kienle Specimen: Received in formalin labeled sigmoid colon. Length: 32.5 cm one stapled and one open resection margin. Per  the requisition, the open end is distal. Serosa: Tan pink to gray with attached tan yellow adipose tissue. At the distal end there is a 2.2 x 1.0 cm full thickness perforation identified. The tissue surrounding the perforation is gray purple with focal exudate. The perforation measures 0.7 cm from the distal resection margin. Contents: The lumen is moderately dilated, and filled with an abundant amount of brown firm fecal material. Mucosa/Wall: The mucosa is tan pink with moderate flattening of the mucosal folds. The mucosa surrounding the defect is gray purple and dusky. No other mucosal lesions are grossly identified. The wall ranges from 0.1 to 0.2 cm in thickness. Lymph nodes: No enlarged lymph nodes are identified. Block Summary: 4 block submitted. A= proximal resection margin. B= distal resection margins C= sections of defect. D= representative mucosa. (KL:gt, 04/26/16)  Assessment  Better  Plan:  -transition to PO pain control -adv diet to solids -Chronic constipation - start agressive bowel regimen since post op ileus resolved -f/u pathology - benign -pack wound daily - open completely -colostomy care -PRN IVF -ID: Zosyn - continue 5 days minimum, probably at d/c.  Stopped andiulafungin after day 4.  CT Scan to r/o abscess if worse -DVT: SCD/Lovenox mobilize as tolerated to help recovery  D/C patient from hospital when patient meets criteria (anticipate in 1 day):  Tolerating oral intake well Ambulating well Adequate pain control without IV medications Urinating  Having flatus HH wound & ostomy care & other disposition planning in place   [name], M.D., F.A.C.S. Gastrointestinal and Minimally Invasive Surgery [facility] [address] [place], [place] [address] [phone] Main / Paging   05/01/2016  Subjective:  Having flatus in bag Less pain Walking well  Objective:  Vital signs:  Vitals:   04/30/16  0557  04/30/16 1327 04/30/16 2051 05/01/16 0607  BP: (!) 120/51 (!) 143/72 (!) 146/80 131/66  Pulse: 81 65 78 70  Resp: '16 16 16 16  ' Temp: 98.6 F (37 C) 98.2 F (36.8 C) 98.4 F (36.9 C) 98.7 F (37.1 C)  TempSrc: Oral Oral Oral Oral  SpO2: 97% 100% 97% 97%  Weight:      Height:        Last BM Date: 04/29/16  Intake/Output   Yesterday:  08/19 0701 - 08/20 0700 In: 270 [P.O.:120; IV Piggyback:150] Out: 1700 [Urine:1700] This shift:  No intake/output data recorded.  Bowel function:  Flatus: YES  BM:  No  Drain: (No drain)   Physical Exam:  General: Pt awake/alert/oriented x4 in No acute distress Eyes: PERRL, normal EOM.  Sclera clear.  No icterus Neuro: CN II-XII intact w/o focal sensory/motor deficits. Lymph: No head/neck/groin lymphadenopathy Psych:  No delerium/psychosis/paranoia HENT: Normocephalic, Mucus membranes moist.  No thrush Neck: Supple, No tracheal deviation Chest: No chest wall pain w good excursion CV:  Pulses intact.  Regular rhythm MS: Normal AROM mjr joints.  No obvious deformity Abdomen: Soft.  Nondistended.  Mildly tender at incisions only.  No evidence of peritonitis.  No incarcerated hernias.  Less edema / hematoma at periumb part.  No pus.  Midline wound clean & superficial.  Colostomy pink w flatus in bag Ext:  SCDs BLE.  No mjr edema.  No cyanosis Skin: No petechiae / purpura  Results:   Labs: Results for orders placed or performed during the hospital encounter of 04/25/16 (from the past 48 hour(s))  CBC     Status: Abnormal   Collection Time: 04/30/16  4:12 AM  Result Value Ref Range   WBC 7.2 4.0 - 10.5 K/uL   RBC 4.05 (L) 4.22 - 5.81 MIL/uL   Hemoglobin 13.2 13.0 - 17.0 g/dL   HCT 37.9 (L) 39.0 - 52.0 %   MCV 93.6 78.0 - 100.0 fL   MCH 32.6 26.0 - 34.0 pg   MCHC 34.8 30.0 - 36.0 g/dL   RDW 13.2 11.5 - 15.5 %   Platelets 222 150 - 400 K/uL  Basic metabolic panel     Status: Abnormal   Collection Time: 04/30/16  4:12 AM   Result Value Ref Range   Sodium 140 135 - 145 mmol/L   Potassium 3.6 3.5 - 5.1 mmol/L   [place] 103 101 - 111 mmol/L   CO2 29 22 - 32 mmol/L   Glucose, Bld 104 (H) 65 - 99 mg/dL   BUN 9 6 - 20 mg/dL   Creatinine, Ser 1.04 0.61 - 1.24 mg/dL   Calcium 8.7 (L) 8.9 - 10.3 mg/dL   GFR calc non Af Amer >60 >60 mL/min   GFR calc Af Amer >60 >60 mL/min    Comment: (NOTE) The eGFR has been calculated using the CKD EPI equation. This calculation has not been validated in all clinical situations. eGFR's persistently <60 mL/min signify possible Chronic Kidney Disease.    Anion gap 8 5 - 15    Imaging / Studies: No results found.  Medications / Allergies: per chart  Antibiotics: Anti-infectives    Start     Dose/Rate Route Frequency Ordered Stop   04/27/16 0300  anidulafungin (ERAXIS) 100 mg in sodium chloride 0.9 % 100 mL IVPB     100 mg over 90 Minutes Intravenous Every 24 hours 04/26/16 0207 04/30/16 0430   04/26/16 0300  anidulafungin (ERAXIS) 200 mg in sodium chloride 0.9 %  200 mL IVPB     200 mg over 180 Minutes Intravenous  Once 04/26/16 0207 04/26/16 0617   04/26/16 0200  piperacillin-tazobactam (ZOSYN) IVPB 3.375 g  Status:  Discontinued     3.375 g 12.5 mL/hr over 240 Minutes Intravenous Every 8 hours 04/25/16 1733 04/25/16 2333   04/26/16 0200  piperacillin-tazobactam (ZOSYN) IVPB 3.375 g     3.375 g 12.5 mL/hr over 240 Minutes Intravenous Every 8 hours 04/25/16 2326 05/03/16 0159   04/25/16 1745  piperacillin-tazobactam (ZOSYN) IVPB 3.375 g     3.375 g 100 mL/hr over 30 Minutes Intravenous STAT 04/25/16 1733 04/25/16 1816        Note: Portions of this report may have been transcribed using voice recognition software. Every effort was made to ensure accuracy; however, inadvertent computerized transcription errors may be present.   Any transcriptional errors that result from this process are unintentional.     [name], M.D., F.A.C.S. Gastrointestinal and  Minimally Invasive Surgery [facility] [address] [place], [place] [address] [phone] Main / Paging   05/01/2016

## 2016-05-01 NOTE — Progress Notes (Signed)
Pts IV was leaking and had to be removed after evening dose of zosyn. Pt did not want another iv tonight. Wants to talk to MD and see if he is going home tomorrow first since his next dose isnt until in the am.

## 2016-05-02 LAB — COMPREHENSIVE METABOLIC PANEL
ALK PHOS: 204 U/L — AB (ref 38–126)
ALT: 46 U/L (ref 17–63)
AST: 52 U/L — ABNORMAL HIGH (ref 15–41)
Albumin: 3.4 g/dL — ABNORMAL LOW (ref 3.5–5.0)
Anion gap: 10 (ref 5–15)
BUN: 13 mg/dL (ref 6–20)
CALCIUM: 9.1 mg/dL (ref 8.9–10.3)
CO2: 27 mmol/L (ref 22–32)
CREATININE: 0.83 mg/dL (ref 0.61–1.24)
Chloride: 101 mmol/L (ref 101–111)
Glucose, Bld: 99 mg/dL (ref 65–99)
Potassium: 4 mmol/L (ref 3.5–5.1)
Sodium: 138 mmol/L (ref 135–145)
TOTAL PROTEIN: 6.9 g/dL (ref 6.5–8.1)
Total Bilirubin: 0.8 mg/dL (ref 0.3–1.2)

## 2016-05-02 LAB — CREATININE, SERUM
CREATININE: 1.01 mg/dL (ref 0.61–1.24)
GFR calc Af Amer: >60 mL/min (ref >60)
GFR calc non Af Amer: >60 mL/min (ref >60)

## 2016-05-02 MED ORDER — AMOXICILLIN-POT CLAVULANATE 875-125 MG PO TABS
1.0000 | ORAL_TABLET | Freq: Two times a day (BID) | ORAL | Status: DC
  Administered 2016-05-02 – 2016-05-03 (×3): 1 via ORAL
  Filled 2016-05-02 (×3): qty 1

## 2016-05-02 MED ORDER — DOCUSATE SODIUM 100 MG PO CAPS
200.0000 mg | ORAL_CAPSULE | Freq: Every day | ORAL | Status: DC
  Administered 2016-05-02 – 2016-05-03 (×2): 200 mg via ORAL
  Filled 2016-05-02 (×2): qty 2

## 2016-05-02 NOTE — Consult Note (Signed)
[name] Nurse ostomy consult note Stoma type/location: LLQ COlostomy Stomal assessment/size:  1 3/4" edematous.  Still no bowel function.  In to begin pouch change and patient states he has no bowel function and does not wish to change the bag today.  Explained that he will need to complete this task.  Requests to wait until the AM.  Would like to see bowel function return first, if possible.  WOC RN agreed to wait until the AM.  Patient demonstrates roll closure.  We explained the need for twice weekly pouch changes and emptying pouch when 1/3 full.  Verbalizes understanding.  Peristomal assessment: Not assessed today.  Treatment options for stomal/peristomal skin: barrier rring Output Scant liquid in pouch Ostomy pouching: 2pc. 2 3/4" pouch  Education provided: See above Enrolled patient in [place] program: Yes [place] team will see tomorrow AM for pouch change per patient request.  Domenic Moras RN BSN [facility] Pager [phone]

## 2016-05-02 NOTE — Care Management Important Message (Signed)
Important Message  Patient Details  Name: Romero Giunta MRN: [id number] Date of Birth: 09/26/1950   Medicare Important Message Given:  Yes    Camillo Flaming 05/02/2016, 10:55 AMImportant Message  Patient Details  Name: Suliman Brisco MRN: [id number] Date of Birth: 04/17/1951   Medicare Important Message Given:  Yes    Camillo Flaming 05/02/2016, 10:55 AM

## 2016-05-02 NOTE — Progress Notes (Signed)
7 Days Post-Op  Subjective: He looks good, but still no bowel movement. I took the bag off and digitalize the ostomy. It is very edematous and swollen but looks fine. He has some gas and some feculent-colored fluid coming through but no stool. His IV came out and he is not received Zosyn today. Objective: Vital signs in last 24 hours: Temp:  [97.5 F (36.4 C)-98.4 F (36.9 C)] 98.4 F (36.9 C) (08/21 0545) Pulse Rate:  [71-81] 81 (08/21 0545) Resp:  [16] 16 (08/21 0545) BP: (143-168)/(87-98) 147/87 (08/21 0545) SpO2:  [95 %-100 %] 95 % (08/21 0545) Last BM Date: 04/29/16 1020 PO BM x 3 Afebrile, VSS Labs okay creatinine rechecked other labs from 04/30/16 No film. Intake/Output from previous day: 08/20 0701 - 08/21 0700 In: 1070 [P.O.:1020; IV Piggyback:50] Out: 1203 [Urine:1200; Stool:3] Intake/Output this shift: No intake/output data recorded.  Head: Normocephalic, without obvious abnormality, atraumatic, Alert oriented and cooperative, no distress. Anxious to go home Resp: clear to auscultation bilaterally GI: Soft tissue bowel sounds open wound looks fine. Staples are out of the midportion it is still a little erythematous. Open sites are clean ostomy is very edematous and tight on digital exam.  Lab Results:   Recent Labs  04/30/16 0412  WBC 7.2  HGB 13.2  HCT 37.9*  PLT 222    BMET  Recent Labs  04/30/16 0412 05/02/16 0515  NA 140  --   K 3.6  --   CL 103  --   CO2 29  --   GLUCOSE 104*  --   BUN 9  --   CREATININE 1.04 1.01  CALCIUM 8.7*  --    PT/INR No results for input(s): LABPROT, INR in the last 72 hours.   Recent Labs Lab 04/25/16 1459  AST 27  ALT 30  ALKPHOS 50  BILITOT 1.4*  PROT 7.7  ALBUMIN 4.8     Lipase     Component Value Date/Time   LIPASE 16 04/25/2016 1459     Studies/Results: No results found.  Medications: . acetaminophen  1,000 mg Oral TID  . antiseptic oral rinse  7 mL Mouth Rinse BID  . enoxaparin (LOVENOX)  injection  40 mg Subcutaneous Q24H  . feeding supplement  1 Container Oral TID BM  . lip balm  1 application Topical BID  . piperacillin-tazobactam (ZOSYN)  IV  3.375 g Intravenous Q8H  . polyethylene glycol  17 g Oral BID  . saccharomyces boulardii  250 mg Oral BID  . sodium chloride flush  3 mL Intravenous Q12H  . vitamin C  500 mg Oral BID    Assessment/Plan Hypertension Hx of melanoma  Assessment/Plan Perforated distal sigmoid colon (stercoral)with fecal peritonitis. (8 cm tear) Exploratory laparotomy with sigmoid colectomy, descending colostomy, Dr. Armandina Gemma 04/25/16 POD 1 Chronic constipation FEN: Heart healthy ID: Zosyn day 8; andiulafungin day 5 completed 04/29/16 DVT: SCD/Lovenox    Plan: Dr. gross is started him on MiraLAX twice a day. I'm going to leave him on Augmentin and off IV fluids for now.  He also has not changed his bag so he needs to be seen by wound care again for further instruction.    LOS: 7 days    Dwayna Kentner 05/02/2016 [phone]

## 2016-05-03 LAB — CBC
HCT: 41 % (ref 39.0–52.0)
HEMOGLOBIN: 13.8 g/dL (ref 13.0–17.0)
MCH: 31.9 pg (ref 26.0–34.0)
MCHC: 33.7 g/dL (ref 30.0–36.0)
MCV: 94.7 fL (ref 78.0–100.0)
Platelets: 331 10*3/uL (ref 150–400)
RBC: 4.33 MIL/uL (ref 4.22–5.81)
RDW: 13.6 % (ref 11.5–15.5)
WBC: 7.8 10*3/uL (ref 4.0–10.5)

## 2016-05-03 MED ORDER — PSYLLIUM 95 % PO PACK: 1.0000 | PACK | Freq: Two times a day (BID) | ORAL | Status: DC

## 2016-05-03 MED ORDER — PSYLLIUM 95 % PO PACK: PACK | ORAL | Status: DC

## 2016-05-03 MED ORDER — DOCUSATE SODIUM 100 MG PO CAPS: ORAL_CAPSULE | ORAL | 0 refills | Status: DC

## 2016-05-03 MED ORDER — OXYCODONE HCL 5 MG PO TABS: 5.0000 mg | ORAL_TABLET | ORAL | 0 refills | Status: DC | PRN

## 2016-05-03 MED ORDER — POLYETHYLENE GLYCOL 3350 17 G PO PACK: PACK | ORAL | 0 refills | Status: DC

## 2016-05-03 MED ORDER — IBUPROFEN 200 MG PO TABS: ORAL_TABLET | ORAL | 0 refills | Status: DC

## 2016-05-03 NOTE — Progress Notes (Signed)
Emptied moderate amount of formed stool from pt's colostomy bag, teach and demonstrate how to do it and instructed the pt that he needs to empty it next time or with NT/RN assitance and the pt agreed.

## 2016-05-03 NOTE — Discharge Instructions (Signed)
[facility]      [facility] °[phone] ° °OPEN ABDOMINAL SURGERY: POST OP INSTRUCTIONS ° °Always review your discharge instruction sheet given to you by the facility where your surgery was performed. ° °IF YOU HAVE DISABILITY OR FAMILY LEAVE FORMS, YOU MUST BRING THEM TO THE OFFICE FOR PROCESSING.  PLEASE DO NOT GIVE THEM TO YOUR DOCTOR. ° °1. A prescription for pain medication may be given to you upon discharge.  Take your pain medication as prescribed, if needed.  If narcotic pain medicine is not needed, then you may take acetaminophen (Tylenol) or ibuprofen (Advil) as needed. °2. Take your usually prescribed medications unless otherwise directed. °3. If you need a refill on your pain medication, please contact your pharmacy. They will contact our office to request authorization.  Prescriptions will not be filled after 5pm or on week-ends. °4. You should follow a light diet the first few days after arrival home, such as soup and crackers, pudding, etc.unless your doctor has advised otherwise. A high-fiber, low fat diet can be resumed as tolerated.   Be sure to include lots of fluids daily. Most patients will experience some swelling and bruising on the chest and neck area.  Ice packs will help.  Swelling and bruising can take several days to resolve °5. Most patients will experience some swelling and bruising in the area of the incision. Ice pack will help. Swelling and bruising can take several days to resolve..  °6. It is common to experience some constipation if taking pain medication after surgery.  Increasing fluid intake and taking a stool softener will usually help or prevent this problem from occurring.  A mild laxative (Milk of Magnesia or Miralax) should be taken according to package directions if there are no bowel movements after 48 hours. °7.  You may have steri-strips (small skin tapes) in place directly over the incision.  These strips should be left on the skin for 7-10 days.  If your  surgeon used skin glue on the incision, you may shower in 24 hours.  The glue will flake off over the next 2-3 weeks.  Any sutures or staples will be removed at the office during your follow-up visit. You may find that a light gauze bandage over your incision may keep your staples from being rubbed or pulled. You may shower and replace the bandage daily. °8. ACTIVITIES:  You may resume regular (light) daily activities beginning the next day--such as daily self-care, walking, climbing stairs--gradually increasing activities as tolerated.  You may have sexual intercourse when it is comfortable.  Refrain from any heavy lifting or straining until approved by your doctor. °a. You may drive when you no longer are taking prescription pain medication, you can comfortably wear a seatbelt, and you can safely maneuver your car and apply brakes °b. Return to Work: ___________________________________ °9. You should see your doctor in the office for a follow-up appointment approximately two weeks after your surgery.  Make sure that you call for this appointment within a day or two after you arrive home to insure a convenient appointment time. °OTHER INSTRUCTIONS:  °_____________________________________________________________ °_____________________________________________________________ ° °WHEN TO CALL YOUR DOCTOR: °1. Fever over 101.0 °2. Inability to urinate °3. Nausea and/or vomiting °4. Extreme swelling or bruising °5. Continued bleeding from incision. °6. Increased pain, redness, or drainage from the incision. °7. Difficulty swallowing or breathing °8. Muscle cramping or spasms. °9. Numbness or tingling in hands or feet or around lips. ° °The clinic staff is available to   answer your questions during regular business hours.  Please dont hesitate to call and ask to speak to one of the nurses if you have concerns.  For further questions, please visit [email or web address]  Dressing Change A dressing is a  material placed over wounds. It keeps the wound clean, dry, and protected from further injury.  BEFORE YOU BEGIN  Get your supplies together. Things you may need include:  Salt solution (saline).  Flexible gauze bandage.  Medicated cream.  Tape.  Gloves.  Belly (abdominal) pads.  Gauze squares.  Plastic bags.  Take pain medicine 30 minutes before the bandage change if you need it.  Take a shower before you do the first bandage change of the day. Put plastic wrap or a bag over the dressing. REMOVING YOUR OLD BANDAGE  Wash your hands with soap and water. Dry your hands with a clean towel.  Put on your gloves.  Remove any tape.  Remove the old bandage as told. If it sticks, put a small amount of warm water on it to loosen the bandage.  Remove any gauze or packing tape in your wound.  Take off your gloves.  Put the gloves, tape, gauze, or any packing tape in a plastic bag. CHANGING YOUR BANDAGE  Open the supplies.  Take the cap off the salt solution.  Open the gauze. Leave the gauze on the inside of the package.  Put on your gloves.  Clean your wound as told by your doctor.  Keep your wound dry if your doctor told you to do so.  Your doctor may tell you to do one or more of the following:  Pick up the gauze. Pour the salt solution over the gauze. Squeeze out the extra salt solution.  Put medicated cream or other medicine on your wound.  Put solution soaked gauze only in your wound, not on the skin around it.  Pack your wound loosely.  Put dry gauze on your wound.  Put belly pads over the dry gauze if your bandages soak through.  Tape the bandage in place so it will not fall off. Do not wrap the tape all the way around your arm or leg.  Wrap the bandage with the flexible gauze bandage as told by your doctor.  Take off your gloves. Put them in the plastic bag with the old bandage. Tie the bag shut and throw it away.  Keep the bandage clean and  dry.  Wash your hands. GET HELP RIGHT AWAY IF:   Your skin around the wound looks red.  Your wound feels more tender or sore.  You see yellowish-white fluid (pus) in the wound.  Your wound smells bad.  You have a fever.  Your skin around the wound has a red rash that itches and burns.  You see black or yellow skin in your wound that was not there before.  You feel sick to your stomach (nauseous), throw up (vomit), and feel very tired.   This information is not intended to replace advice given to you by your health care provider. Make sure you discuss any questions you have with your health care provider.   Document Released: 11/25/2008 Document Revised: 09/19/2014 Document Reviewed: 07/10/2011 Elsevier Interactive Patient Education 2016 [place] A colostomy is an opening for stool to leave your body when a medical condition prevents it from leaving through the usual opening (rectum). During a surgery, a piece of large intestine (colon) is brought through a hole in  the abdominal wall. The new opening is called a stoma or ostomy. A bag or pouch fits over the stoma to catch stool and gas. Your stool may be liquid, somewhat pasty, or formed. CARING FOR YOUR STOMA  Normally, the stoma looks a lot like the inside of your cheek: pink, red, and moist. At first it may be swollen, but this swelling will decrease within 6 weeks. Keep the skin around your stoma clean and dry. You can gently wash your stoma and the skin around your stoma in the shower with a clean, soft washcloth. If you develop any skin irritation, your caregiver may give you a stoma powder or ointment to help heal the area. Do not use any products other than those specifically given to you by your caregiver.  Your stoma should not be uncomfortable. If you notice any stinging or burning, your pouch may be leaking, and the skin around your stoma may be coming into contact with stool. This can cause skin  irritation. If you notice stinging, replace your pouch with a new one and discard the old one. OSTOMY POUCHES  The pouch that fits over the ostomy can be made up of either 1 or 2 pieces. A one-piece pouch has a skin barrier piece and the pouch itself in one unit. A two-piece pouch has a skin barrier with a separate pouch that snaps on and off of the skin barrier. Either way, you should empty the pouch when it is only  to  full. Do not let more stool or gas build up. This could cause the pouch to leak. Some ostomy bags have a built-in gas release valve. Ostomy deodorizer (5 drops) can be put into the pouch to prevent odor. Some people use ostomy lubricant drops inside the pouch to help the stool slide out of the bag more easily and completely.  EMPTYING YOUR OSTOMY POUCH  You may get lessons on how to empty your pouch from a wound-ostomy nurse before you leave the hospital. Here are the basic steps:  Wash your hands with soap and water.  Sit far back on the toilet.  Put several pieces of toilet paper into the toilet water. This will prevent splashing as you empty the stool into the toilet bowl.  Unclip or unvelcro the tail end of the pouch.  Unroll the tail and empty stool into the toilet.  Clean the tail with toilet paper.  Reroll the tail, and clip or velcro it closed.  Wash your hands again. CHANGING YOUR OSTOMY POUCH  Change your ostomy pouch about every 3 to 4 days for the first 6 weeks, then every 5 to7 days. Always change the bag sooner if there is any leakage or you begin to notice any discomfort or irritation of the skin around the stoma. When possible, plan to change your ostomy pouch before eating or drinking as this will lessen the chance of stool coming out during the pouch change. A wound-ostomy nurse may teach you how to change your pouch before you leave the hospital. Here are the basic steps:  Lay out your supplies.  Wash your hands with soap and water.  Carefully remove  the old pouch.  Wash the stoma and allow it to dry. Men may be advised to shave any hair around the stoma very carefully. This will make the adhesive stick better.  Use the stoma measuring guide that comes with your pouch set to decide what size hole you will need to cut in the skin  barrier piece. Choose the smallest possible size that will hold the stoma but will not touch it.  Use the guide to trace the circle on the back of the skin barrier piece. Cut out the hole.  Hold the skin barrier piece over the stoma to make sure the hole is the correct size.  Remove the adhesive paper backing from the skin barrier piece.  Squeeze stoma paste around the opening of the skin barrier piece.  Clean and dry the skin around the stoma again.  Carefully fit the skin barrier piece over your stoma.  If you are using a two-piece pouch, snap the pouch onto the skin barrier piece.  Close the tail of the pouch.  Put your hand over the top of the skin barrier piece to help warm it for about 5 minutes, so that it conforms to your body better.  Wash your hands again. DIET TIPS   Continue to follow your usual diet.  Drink about eight 8 oz glasses of water each day.  You can prevent gas by eating slowly and chewing your food thoroughly.  If you feel concerned that you have too much gas, you can cut back on gas-producing foods, such as:  Spicy foods.  Onions and garlic.  Cruciferous vegetables (cabbage, broccoli, cauliflower, Brussels sprouts).  Beans and legumes.  Some cheeses.  Eggs.  Fish.  Bubbly (carbonated) drinks.  Chewing gum. GENERAL TIPS   You can shower with or without the bag in place.  Always keep the bag on if you are bathing or swimming.  If your bag gets wet, you can dry it with a blow-dryer set to cool.  Avoid wearing tight clothing directly over your stoma so that it does not become irritated or bleed. Tight clothing can also prevent stool from draining into the  pouch.  It is helpful to always have an extra skin barrier and pouch with you when traveling. Do not leave them anywhere too warm, as parts of them can melt.  Do not let your seat belt rest on your stoma. Try to keep the seat belt either above or below your stoma, or use a tiny pillow to cushion it.  You can still participate in sports, but you should avoid activities in which there is a risk of getting hit in the abdomen.  You can still have sex. It is a good idea to empty your pouch prior to sex. Some people and their partners feel very comfortable seeing the pouch during sex. Others choose to wear lingerie or a T-shirt that covers the device. SEEK IMMEDIATE MEDICAL CARE IF:  You notice a change in the size or color of the stoma, especially if it becomes very red, purple, black, or pale white.  You have bloody stools or bleeding from the stoma.  You have abdominal pain, nausea, vomiting, or bloating.  There is anything unusual protruding from the stoma.  You have irritation or red skin around the stoma.  No stool is passing from the stoma.  You have diarrhea (requiring more frequent than normal pouch emptying).   This information is not intended to replace advice given to you by your health care provider. Make sure you discuss any questions you have with your health care provider.   Document Released: 09/01/2003 Document Revised: 11/21/2011 Document Reviewed: 01/26/2011 Elsevier Interactive Patient Education [employer].

## 2016-05-03 NOTE — Consult Note (Signed)
[place] Nurse ostomy consult note Stoma type/location: LLQ Colostomy  Pouch change and teaching this AM.  Stomal assessment/size: 1 3/4" edematous.  Bowel function last PM and patient emptied the pouch.  Agrees to pouch change this AM.  Peristomal assessment: Bruising to peristomal skin from 6 o'clock to 9:00, tender to touch.  Skin intact.  Bottom hemisphere of stoma is less swollen than top.  Barrier ring on bottom half  will be used to insure seal. Abdomen is hairy and discussed clipping hair when needed.  Treatment options for stomal/peristomal skin: Barrier ring, hair clipping.  Output Soft brown stool in pouch.   Ostomy pouching: 2pc. 2 3/4" pouch with 1/2 barrier ring on bottom half.    Education provided: Pouch removed with push/pull technique.  Patient coached through and measured stoma and cut pouch to fit.  Barrier ring applied to bottom half and pouch applied.  Patient able to roll closed.  Understands emptying and demonstrates open/close of pouch and cleaning bottom with toilet tissue. Empty when 1/3 full and likely twice weekly pouch changes.  Enrolled patient in [place] program: Yes Will not follow at this time.  Please re-consult if needed.  Domenic Moras RN BSN [place] Pager [phone]

## 2016-05-03 NOTE — Progress Notes (Signed)
[facility]  Patient Status: New  [facility] is providing the following services: RN  If patient discharges after hours, please call [phone].   [name] Brown 05/03/2016, 8:43 AM

## 2016-05-03 NOTE — Progress Notes (Signed)
Patient alert and oriented with pain controlled. Patient and spouse verbalized and demonstrated how to change dressing. Patient also demonstrated how to empty ostomy pouch. Home health is setup with [facility]. Patient given the rest of discharge instructions and prescriptions.  Patient and spouse verbalized understanding of all discharge instructions and teaching. All questions and concerns answered.

## 2016-05-03 NOTE — Progress Notes (Signed)
8 Days Post-Op  Subjective: Finally had several BM's  Objective: Vital signs in last 24 hours: Temp:  [98.2 F (36.8 C)-98.6 F (37 C)] 98.2 F (36.8 C) (08/22 0459) Pulse Rate:  [72-80] 80 (08/22 0459) Resp:  [14-16] 14 (08/22 0459) BP: (143-165)/(80-82) 143/82 (08/22 0459) SpO2:  [97 %-100 %] 97 % (08/22 0459) Last BM Date: 05/02/16  Intake/Output from previous day: 08/21 0701 - 08/22 0700 In: 360 [P.O.:360] Out: 1800 [Urine:1800] Intake/Output this shift: No intake/output data recorded.  Looks good Midline wound clean Ostomy pink and still with some edema  Lab Results:   Recent Labs  05/03/16 0446  WBC 7.8  HGB 13.8  HCT 41.0  PLT 331   BMET  Recent Labs  05/02/16 0515 05/02/16 1404  NA  --  138  K  --  4.0  CL  --  101  CO2  --  27  GLUCOSE  --  99  BUN  --  13  CREATININE 1.01 0.83  CALCIUM  --  9.1   PT/INR No results for input(s): LABPROT, INR in the last 72 hours. ABG No results for input(s): PHART, HCO3 in the last 72 hours.  Invalid input(s): PCO2, PO2  Studies/Results: No results found.  Anti-infectives: Anti-infectives    Start     Dose/Rate Route Frequency Ordered Stop   05/02/16 1400  amoxicillin-clavulanate (AUGMENTIN) 875-125 MG per tablet 1 tablet     1 tablet Oral Every 12 hours 05/02/16 1300     04/27/16 0300  anidulafungin (ERAXIS) 100 mg in sodium chloride 0.9 % 100 mL IVPB     100 mg over 90 Minutes Intravenous Every 24 hours 04/26/16 0207 04/30/16 0430   04/26/16 0300  anidulafungin (ERAXIS) 200 mg in sodium chloride 0.9 % 200 mL IVPB     200 mg over 180 Minutes Intravenous  Once 04/26/16 0207 04/26/16 0617   04/26/16 0200  piperacillin-tazobactam (ZOSYN) IVPB 3.375 g  Status:  Discontinued     3.375 g 12.5 mL/hr over 240 Minutes Intravenous Every 8 hours 04/25/16 1733 04/25/16 2333   04/26/16 0200  piperacillin-tazobactam (ZOSYN) IVPB 3.375 g  Status:  Discontinued     3.375 g 12.5 mL/hr over 240 Minutes Intravenous  Every 8 hours 04/25/16 2326 05/02/16 1300   04/25/16 1745  piperacillin-tazobactam (ZOSYN) IVPB 3.375 g     3.375 g 100 mL/hr over 30 Minutes Intravenous STAT 04/25/16 1733 04/25/16 1816      Assessment/Plan: s/p Procedure(s): EXPLORATORY LAPAROTOMY WITH SIGMOID COLECTOMY, COLOSTOMY (N/A)  Ok for discharge today with home health  LOS: 8 days    Alexandra Posadas A 05/03/2016

## 2016-05-03 NOTE — Discharge Summary (Signed)
Physician Discharge Summary  Patient ID: [name] Brown MRN: [id number] DOB/AGE: [birth year] 65 y.o.  Admit date: 04/25/2016 Discharge date: 05/03/2016  Admission Diagnoses: Perforated distal sigmoid colon (stercoral)with fecal peritonitis. (8 cm tear) Chronic constipation Hypertension Hx of melanoma  Discharge Diagnoses:  Same  Principal Problem:   Perforated sigmoid colon s/p colectomy/colostomy 04/26/2016 Active Problems:   Acute hypoxemic respiratory failure ([place])   Peritonitis ([place])   Stercoral ulcer of large intestine   Incarcerated umbilical hernia   Hypertension   PROCEDURES: Exploratory laparotomy with sigmoid colectomy, descending colostomy, Dr. 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 Denies fever, chills, nausea, emesis, or BRBPR. No hx of diverticular disease. No prior abdominal surgery. Long-standing umbilcal hernia. Never had colonoscopy. Attempted to have BM today without success but with recurrence of pain. Patient was seen in the emergency department taken the operating room by Dr. Harlow Asa. Patient had a large stercoral perforation with a large amount of fecal contamination. Patient tolerated the procedure well and returned to the intensive care unit in satisfactory condition on the ventilator. He was seen and followed by pulmonary/critical care medicine during his acute stay in the ICU. He was extubated quickly postop. He was maintained on IV antibiotics. Despite concerns for sepsis he did exceptionally well postop. We took the staples out of the midportion of his wound because of erythema. Wound remains intact the open sites are healing nicely, we plan to continue wet-to-dry dressings at home. He had a good deal of edema within his ostomy. We actually Kept him in the hospital because of the edema and no bowel  movement. We digitalize the ostomy on 05/02/16, and it was so tight it would actually pull on the glove on my small finger. He did have a bowel movement for the first time later that day. He was seen by Dr. Ninfa Linden today his sites look good, ostomy is functioning now and it was his opinion patient could go home. We plan to discontinue antibiotics at discharge. I started him on a bowel regimen with Metamucil, Colace, and MiraLAX.  CBC Latest Ref Rng & Units 05/03/2016 04/30/2016 04/27/2016  WBC 4.0 - 10.5 K/uL 7.8 7.2 9.2  Hemoglobin 13.0 - 17.0 g/dL 13.8 13.2 13.6  Hematocrit 39.0 - 52.0 % 41.0 37.9(L) 39.3  Platelets 150 - 400 K/uL 331 222 169   CMP Latest Ref Rng & Units 05/02/2016 05/02/2016 04/30/2016  Glucose 65 - 99 mg/dL 99 - 104(H)  BUN 6 - 20 mg/dL 13 - 9  Creatinine 0.61 - 1.24 mg/dL 0.83 1.01 1.04  Sodium 135 - 145 mmol/L 138 - 140  Potassium 3.5 - 5.1 mmol/L 4.0 - 3.6  Chloride 101 - 111 mmol/L 101 - 103  CO2 22 - 32 mmol/L 27 - 29  Calcium 8.9 - 10.3 mg/dL 9.1 - 8.7(L)  Total Protein 6.5 - 8.1 g/dL 6.9 - -  Total Bilirubin 0.3 - 1.2 mg/dL 0.8 - -  Alkaline Phos 38 - 126 U/L 204(H) - -  AST 15 - 41 U/L 52(H) - -  ALT 17 - 63 U/L 46 - -    Condition on d/c:  Improved    Disposition: Final discharge disposition not confirmed     Medication List    TAKE these medications   docusate sodium 100 MG capsule Commonly known as:  COLACE Use this as  needed until the ostomy is working normally.  If your stool is to soft you can decrease it or stop it.   ibuprofen 200 MG tablet Commonly known as:  ADVIL,MOTRIN You can take 2-3 every 6 hours as needed for pain. This can be your first line pain control and use narcotic as back up. You can also use plain tylenol for first line pain control. What changed:  how much to take  how to take this  when to take this  reasons to take this  additional instructions   oxyCODONE 5 MG immediate release tablet Commonly known as:  Oxy  IR/ROXICODONE Take 1-2 tablets (5-10 mg total) by mouth every 4 (four) hours as needed for moderate pain, severe pain or breakthrough pain.   polyethylene glycol packet Commonly known as:  MIRALAX / GLYCOLAX You can buy this over the counter and use as needed for constipation as needed. Follow product instructions on the package.   psyllium 95 % Pack Commonly known as:  HYDROCIL/METAMUCIL Use this as directed on the package 1-2 times per day.  With your history you can use this twice a day to start.  You want a regular soft Bowel movement 1-2 times per day.  You can use this, the colace and the Miralax as tools to help accomplish this.  If your stools is to soft, decrease the Miralax first, the Colace second and the metamucil last.      Follow-up Information    [facility] .   Why:  home health nurse Contact information: [place] [address] [phone]        Earnstine Regal, MD .   Specialty:  General Surgery Why:  : Make an appointment for 2 weeks. Continue wet-to-dry dressings to open site until healed. Contact information: [address] [place] [address] [phone]           Signed: Earnstine Regal 05/03/2016, 11:18 AM

## 2016-05-04 DIAGNOSIS — Z85820 Personal history of malignant melanoma of skin: Secondary | ICD-10-CM | POA: Diagnosis not present

## 2016-05-04 DIAGNOSIS — Z433 Encounter for attention to colostomy: Secondary | ICD-10-CM | POA: Diagnosis not present

## 2016-05-04 DIAGNOSIS — Z48815 Encounter for surgical aftercare following surgery on the digestive system: Secondary | ICD-10-CM | POA: Diagnosis not present

## 2016-05-04 DIAGNOSIS — I10 Essential (primary) hypertension: Secondary | ICD-10-CM | POA: Diagnosis not present

## 2016-05-05 DIAGNOSIS — Z433 Encounter for attention to colostomy: Secondary | ICD-10-CM | POA: Diagnosis not present

## 2016-05-05 DIAGNOSIS — I10 Essential (primary) hypertension: Secondary | ICD-10-CM | POA: Diagnosis not present

## 2016-05-05 DIAGNOSIS — Z48815 Encounter for surgical aftercare following surgery on the digestive system: Secondary | ICD-10-CM | POA: Diagnosis not present

## 2016-05-05 DIAGNOSIS — Z85820 Personal history of malignant melanoma of skin: Secondary | ICD-10-CM | POA: Diagnosis not present

## 2016-05-06 DIAGNOSIS — Z48815 Encounter for surgical aftercare following surgery on the digestive system: Secondary | ICD-10-CM | POA: Diagnosis not present

## 2016-05-06 DIAGNOSIS — Z85820 Personal history of malignant melanoma of skin: Secondary | ICD-10-CM | POA: Diagnosis not present

## 2016-05-06 DIAGNOSIS — Z433 Encounter for attention to colostomy: Secondary | ICD-10-CM | POA: Diagnosis not present

## 2016-05-06 DIAGNOSIS — I10 Essential (primary) hypertension: Secondary | ICD-10-CM | POA: Diagnosis not present

## 2016-05-10 DIAGNOSIS — Z85820 Personal history of malignant melanoma of skin: Secondary | ICD-10-CM | POA: Diagnosis not present

## 2016-05-10 DIAGNOSIS — Z433 Encounter for attention to colostomy: Secondary | ICD-10-CM | POA: Diagnosis not present

## 2016-05-10 DIAGNOSIS — Z48815 Encounter for surgical aftercare following surgery on the digestive system: Secondary | ICD-10-CM | POA: Diagnosis not present

## 2016-05-10 DIAGNOSIS — I10 Essential (primary) hypertension: Secondary | ICD-10-CM | POA: Diagnosis not present

## 2016-05-12 DIAGNOSIS — Z85820 Personal history of malignant melanoma of skin: Secondary | ICD-10-CM | POA: Diagnosis not present

## 2016-05-12 DIAGNOSIS — Z433 Encounter for attention to colostomy: Secondary | ICD-10-CM | POA: Diagnosis not present

## 2016-05-12 DIAGNOSIS — Z48815 Encounter for surgical aftercare following surgery on the digestive system: Secondary | ICD-10-CM | POA: Diagnosis not present

## 2016-05-12 DIAGNOSIS — I10 Essential (primary) hypertension: Secondary | ICD-10-CM | POA: Diagnosis not present

## 2016-05-17 DIAGNOSIS — Z48815 Encounter for surgical aftercare following surgery on the digestive system: Secondary | ICD-10-CM | POA: Diagnosis not present

## 2016-05-17 DIAGNOSIS — Z85820 Personal history of malignant melanoma of skin: Secondary | ICD-10-CM | POA: Diagnosis not present

## 2016-05-17 DIAGNOSIS — Z433 Encounter for attention to colostomy: Secondary | ICD-10-CM | POA: Diagnosis not present

## 2016-05-17 DIAGNOSIS — I10 Essential (primary) hypertension: Secondary | ICD-10-CM | POA: Diagnosis not present

## 2016-05-23 DIAGNOSIS — D225 Melanocytic nevi of trunk: Secondary | ICD-10-CM | POA: Diagnosis not present

## 2016-05-23 DIAGNOSIS — I10 Essential (primary) hypertension: Secondary | ICD-10-CM | POA: Diagnosis not present

## 2016-05-23 DIAGNOSIS — L821 Other seborrheic keratosis: Secondary | ICD-10-CM | POA: Diagnosis not present

## 2016-05-23 DIAGNOSIS — D2271 Melanocytic nevi of right lower limb, including hip: Secondary | ICD-10-CM | POA: Diagnosis not present

## 2016-05-23 DIAGNOSIS — L718 Other rosacea: Secondary | ICD-10-CM | POA: Diagnosis not present

## 2016-05-23 DIAGNOSIS — D2272 Melanocytic nevi of left lower limb, including hip: Secondary | ICD-10-CM | POA: Diagnosis not present

## 2016-05-23 DIAGNOSIS — Z85820 Personal history of malignant melanoma of skin: Secondary | ICD-10-CM | POA: Diagnosis not present

## 2016-05-23 DIAGNOSIS — D2261 Melanocytic nevi of right upper limb, including shoulder: Secondary | ICD-10-CM | POA: Diagnosis not present

## 2016-05-23 DIAGNOSIS — L578 Other skin changes due to chronic exposure to nonionizing radiation: Secondary | ICD-10-CM | POA: Diagnosis not present

## 2016-05-23 DIAGNOSIS — Z48815 Encounter for surgical aftercare following surgery on the digestive system: Secondary | ICD-10-CM | POA: Diagnosis not present

## 2016-05-23 DIAGNOSIS — Z433 Encounter for attention to colostomy: Secondary | ICD-10-CM | POA: Diagnosis not present

## 2016-05-30 DIAGNOSIS — Z433 Encounter for attention to colostomy: Secondary | ICD-10-CM | POA: Diagnosis not present

## 2016-05-30 DIAGNOSIS — Z48815 Encounter for surgical aftercare following surgery on the digestive system: Secondary | ICD-10-CM | POA: Diagnosis not present

## 2016-05-30 DIAGNOSIS — I10 Essential (primary) hypertension: Secondary | ICD-10-CM | POA: Diagnosis not present

## 2016-05-30 DIAGNOSIS — Z85820 Personal history of malignant melanoma of skin: Secondary | ICD-10-CM | POA: Diagnosis not present

## 2016-07-19 DIAGNOSIS — R933 Abnormal findings on diagnostic imaging of other parts of digestive tract: Secondary | ICD-10-CM | POA: Diagnosis not present

## 2016-07-19 DIAGNOSIS — Z1210 Encounter for screening for malignant neoplasm of intestinal tract, unspecified: Secondary | ICD-10-CM | POA: Diagnosis not present

## 2016-07-19 DIAGNOSIS — K5901 Slow transit constipation: Secondary | ICD-10-CM | POA: Diagnosis not present

## 2016-07-21 DIAGNOSIS — H2513 Age-related nuclear cataract, bilateral: Secondary | ICD-10-CM | POA: Diagnosis not present

## 2016-07-21 DIAGNOSIS — H5213 Myopia, bilateral: Secondary | ICD-10-CM | POA: Diagnosis not present

## 2016-07-24 LAB — HM COLONOSCOPY

## 2016-07-25 DIAGNOSIS — D490 Neoplasm of unspecified behavior of digestive system: Secondary | ICD-10-CM | POA: Diagnosis not present

## 2016-07-25 DIAGNOSIS — D126 Benign neoplasm of colon, unspecified: Secondary | ICD-10-CM | POA: Diagnosis not present

## 2016-07-25 DIAGNOSIS — D123 Benign neoplasm of transverse colon: Secondary | ICD-10-CM | POA: Diagnosis not present

## 2016-07-25 DIAGNOSIS — Z09 Encounter for follow-up examination after completed treatment for conditions other than malignant neoplasm: Secondary | ICD-10-CM | POA: Diagnosis not present

## 2016-07-25 DIAGNOSIS — R634 Abnormal weight loss: Secondary | ICD-10-CM | POA: Diagnosis not present

## 2016-07-25 DIAGNOSIS — Z1211 Encounter for screening for malignant neoplasm of colon: Secondary | ICD-10-CM | POA: Diagnosis not present

## 2016-07-25 DIAGNOSIS — K635 Polyp of colon: Secondary | ICD-10-CM | POA: Diagnosis not present

## 2016-07-25 DIAGNOSIS — D125 Benign neoplasm of sigmoid colon: Secondary | ICD-10-CM | POA: Diagnosis not present

## 2016-07-25 DIAGNOSIS — C180 Malignant neoplasm of cecum: Secondary | ICD-10-CM | POA: Diagnosis not present

## 2016-07-25 DIAGNOSIS — K631 Perforation of intestine (nontraumatic): Secondary | ICD-10-CM | POA: Diagnosis not present

## 2016-07-28 DIAGNOSIS — K635 Polyp of colon: Secondary | ICD-10-CM | POA: Diagnosis not present

## 2016-07-28 DIAGNOSIS — D126 Benign neoplasm of colon, unspecified: Secondary | ICD-10-CM | POA: Diagnosis not present

## 2016-07-28 DIAGNOSIS — C180 Malignant neoplasm of cecum: Secondary | ICD-10-CM | POA: Diagnosis not present

## 2016-07-28 DIAGNOSIS — Z1211 Encounter for screening for malignant neoplasm of colon: Secondary | ICD-10-CM | POA: Diagnosis not present

## 2016-07-29 ENCOUNTER — Ambulatory Visit: Payer: Self-pay | Admitting: Surgery

## 2016-08-16 ENCOUNTER — Ambulatory Visit (HOSPITAL_COMMUNITY)
Admission: RE | Admit: 2016-08-16 | Discharge: 2016-08-16 | Disposition: A | Discharge status: Home / Self Care | Payer: Medicare Other | Source: Ambulatory Visit | Attending: Anesthesiology | Admitting: Anesthesiology

## 2016-08-16 ENCOUNTER — Encounter (HOSPITAL_COMMUNITY): Payer: Self-pay

## 2016-08-16 ENCOUNTER — Encounter (HOSPITAL_COMMUNITY)
Admission: RE | Admit: 2016-08-16 | Discharge: 2016-08-16 | Disposition: A | Discharge status: Home / Self Care | Payer: Medicare Other | Source: Ambulatory Visit | Attending: Surgery | Admitting: Surgery

## 2016-08-16 ENCOUNTER — Encounter (HOSPITAL_COMMUNITY): Payer: Self-pay | Admitting: Surgery

## 2016-08-16 DIAGNOSIS — R938 Abnormal findings on diagnostic imaging of other specified body structures: Secondary | ICD-10-CM | POA: Insufficient documentation

## 2016-08-16 DIAGNOSIS — Z01818 Encounter for other preprocedural examination: Secondary | ICD-10-CM | POA: Diagnosis not present

## 2016-08-16 DIAGNOSIS — R9389 Abnormal findings on diagnostic imaging of other specified body structures: Secondary | ICD-10-CM

## 2016-08-16 HISTORY — DX: Malignant neoplasm of colon, unspecified: C18.9

## 2016-08-16 HISTORY — DX: Gastro-esophageal reflux disease without esophagitis: K21.9

## 2016-08-16 HISTORY — DX: Presence of spectacles and contact lenses: Z97.3

## 2016-08-16 HISTORY — DX: Tinnitus, unspecified ear: H93.19

## 2016-08-16 HISTORY — DX: Perforation of intestine (nontraumatic): K63.1

## 2016-08-16 HISTORY — DX: Family history of other specified conditions: Z84.89

## 2016-08-16 LAB — BASIC METABOLIC PANEL
ANION GAP: 8 (ref 5–15)
BUN: 23 mg/dL — ABNORMAL HIGH (ref 6–20)
CHLORIDE: 102 mmol/L (ref 101–111)
CO2: 27 mmol/L (ref 22–32)
Calcium: 9.5 mg/dL (ref 8.9–10.3)
Creatinine, Ser: 1.18 mg/dL (ref 0.61–1.24)
GFR calc non Af Amer: >60 mL/min (ref >60)
GLUCOSE: 82 mg/dL (ref 65–99)
POTASSIUM: 4.9 mmol/L (ref 3.5–5.1)
Sodium: 137 mmol/L (ref 135–145)

## 2016-08-16 LAB — CBC
HEMATOCRIT: 46.6 % (ref 39.0–52.0)
HEMOGLOBIN: 15.8 g/dL (ref 13.0–17.0)
MCH: 32.3 pg (ref 26.0–34.0)
MCHC: 33.9 g/dL (ref 30.0–36.0)
MCV: 95.3 fL (ref 78.0–100.0)
Platelets: 224 10*3/uL (ref 150–400)
RBC: 4.89 MIL/uL (ref 4.22–5.81)
RDW: 13.5 % (ref 11.5–15.5)
WBC: 4.8 10*3/uL (ref 4.0–10.5)

## 2016-08-16 NOTE — Patient Instructions (Signed)
[name] Brown  08/16/2016   Your procedure is scheduled on: Thursday August 18, 2016  Report to [facility]  Entrance take [place]  elevators to 3rd floor to  [place] at 10:15 AM.  Call this number if you have problems the morning of surgery [phone]   Remember: ONLY 1 PERSON MAY GO WITH YOU TO SHORT STAY TO GET  READY MORNING OF [place].  Do not eat food or drink liquids :After Midnight.     Take these medicines the morning of surgery with A SIP OF WATER: Famotidine (Pepcid) if needed                                 You may not have any metal on your body including hair pins and              piercings  Do not wear jewelry,  lotions, powders or colognes, deodorant              Men may shave face and neck.   Do not bring valuables to the hospital. [place].  Contacts, dentures or bridgework may not be worn into surgery.  Leave suitcase in the car. After surgery it may be brought to your room.             _____________________________________________________________________             [facility] - Preparing for Surgery Before surgery, you can play an important role.  Because skin is not sterile, your skin needs to be as free of germs as possible.  You can reduce the number of germs on your skin by washing with CHG (chlorahexidine gluconate) soap before surgery.  CHG is an antiseptic cleaner which kills germs and bonds with the skin to continue killing germs even after washing. Please DO NOT use if you have an allergy to CHG or antibacterial soaps.  If your skin becomes reddened/irritated stop using the CHG and inform your nurse when you arrive at Short Stay. Do not shave (including legs and underarms) for at least 48 hours prior to the first CHG shower.  You may shave your face/neck. Please follow these instructions carefully:  1.  Shower with CHG Soap the night before surgery and the   morning of Surgery.  2.  If you choose to wash your hair, wash your hair first as usual with your  normal  shampoo.  3.  After you shampoo, rinse your hair and body thoroughly to remove the  shampoo.                           4.  Use CHG as you would any other liquid soap.  You can apply chg directly  to the skin and wash                       Gently with a scrungie or clean washcloth.  5.  Apply the CHG Soap to your body ONLY FROM THE NECK DOWN.   Do not use on face/ open  Wound or open sores. Avoid contact with eyes, ears mouth and genitals (private parts).                       Wash face,  Genitals (private parts) with your normal soap.             6.  Wash thoroughly, paying special attention to the area where your surgery  will be performed.  7.  Thoroughly rinse your body with warm water from the neck down.  8.  DO NOT shower/wash with your normal soap after using and rinsing off  the CHG Soap.                9.  Pat yourself dry with a clean towel.            10.  Wear clean pajamas.            11.  Place clean sheets on your bed the night of your first shower and do not  sleep with pets. Day of Surgery : Do not apply any lotions/deodorants the morning of surgery.  Please wear clean clothes to the hospital/surgery center.  FAILURE TO FOLLOW THESE INSTRUCTIONS MAY RESULT IN THE CANCELLATION OF YOUR SURGERY PATIENT SIGNATURE_________________________________  NURSE SIGNATURE__________________________________  ________________________________________________________________________

## 2016-08-16 NOTE — H&P (Signed)
General Surgery [facility]  Early Chars. Durio DOB: 09/24/1950 Married / Language: English / Race: White Male   History of Present Illness  The patient is a 65 year old male presenting for a post-operative visit.  Patient returns today for postoperative visit having undergone exploratory laparotomy and Hartman's resection for perforation of the sigmoid colon. Patient has done well since discharge. Home health nurses have been assisting with midline wound care. Colostomy has been functioning well.   Other Problems Gastroesophageal Reflux Disease High blood pressure Melanoma  Past Surgical History Colon Removal - Partial  Diagnostic Studies History Colonoscopy never  Allergies No Known Drug Allergies09/04/2016  Medication History No Current Medications Medications Reconciled  Social History Alcohol use Moderate alcohol use. Caffeine use Coffee. No drug use Tobacco use Never smoker.  Family History Alcohol Abuse Brother, Father. Anesthetic complications Mother. Breast Cancer Mother, Sister. Cerebrovascular Accident Mother. Heart Disease Father, Mother. Heart disease in male family member before age 64 Hypertension Father, Mother. Respiratory Condition Father. Thyroid problems Mother.  Review of Systems General Not Present- Appetite Loss, Chills, Fatigue, Fever, Night Sweats, Weight Gain and Weight Loss. Skin Not Present- Change in Wart/Mole, Dryness, Hives, Jaundice, New Lesions, Non-Healing Wounds, Rash and Ulcer. HEENT Present- Hearing Loss, Ringing in the Ears and Wears glasses/contact lenses. Not Present- Earache, Hoarseness, Nose Bleed, Oral Ulcers, Seasonal Allergies, Sinus Pain, Sore Throat, Visual Disturbances and Yellow Eyes. Respiratory Not Present- Bloody sputum, Chronic Cough, Difficulty Breathing, Snoring and Wheezing. Breast Not Present- Breast Mass, Breast Pain, Nipple Discharge and Skin  Changes. Cardiovascular Not Present- Chest Pain, Difficulty Breathing Lying Down, Leg Cramps, Palpitations, Rapid Heart Rate, Shortness of Breath and Swelling of Extremities. Gastrointestinal Present- Constipation. Not Present- Abdominal Pain, Bloating, Bloody Stool, Change in Bowel Habits, Chronic diarrhea, Difficulty Swallowing, Excessive gas, Gets full quickly at meals, Hemorrhoids, Indigestion, Nausea, Rectal Pain and Vomiting. Male Genitourinary Not Present- Blood in Urine, Change in Urinary Stream, Frequency, Impotence, Nocturia, Painful Urination, Urgency and Urine Leakage.  Vitals Weight: 161 lb Height: 67in Body Surface Area: 1.84 m Body Mass Index: 25.22 kg/m  Temp.: 97.51F(Temporal)  Pulse: 79 (Regular)  BP: 124/80 (Sitting, Left Arm, Standard)   Physical Exam  The physical exam findings are as follows: Note:Limited examination  Midline incision is healing nicely. Wound is almost completely epithelialized. There is no sign of infection. There is no sign of incisional hernia.  Colostomy is pink and functioning normally without ulceration.   Assessment & Plan  PERFORATED SIGMOID COLON (K63.1) PRESENCE OF COLOSTOMY  The patient has recovered nicely from urgent laparotomy and resection with colostomy for perforated colon.  At his last office visit, the wound had completely epithelialized.  There was no sign of incisional hernia.  Patient underwent colonoscopy by Dr. Clarene Essex prior to colostomy closure.  Patient was found at the time of colonoscopy to have a neoplasm in the right colon adjacent to the ileocecal valve.  Biopsy shows invasive adenocarcinoma.  I have spoken with the patient by telephone.  We have discussed the need for resection of the right colon at the time of colostomy closure.  I have reviewed this procedure with my partner, a colorectal surgeon, and can see no adverse events related to combining these procedures.  Therefore we will plan to  proceed with right colectomy and colostomy closure concurrently.  The risks and benefits of the procedure have been discussed at length with the patient.  The patient understands the proposed procedure, potential alternative treatments, and  the course of recovery to be expected.  All of the patient's questions have been answered at this time.  The patient wishes to proceed with surgery.  Earnstine Regal, MD, [facility] Office: [phone]

## 2016-08-16 NOTE — Progress Notes (Signed)
BMP results in epic per PAT visit 08/16/2016 sent to Dr Harlow Asa

## 2016-08-18 ENCOUNTER — Inpatient Hospital Stay (HOSPITAL_COMMUNITY)
Admission: RE | Admit: 2016-08-18 | Discharge: 2016-08-23 | DRG: 330 | Disposition: A | Discharge status: Home / Self Care | Payer: Medicare Other | Source: Ambulatory Visit | Attending: Surgery | Admitting: Surgery

## 2016-08-18 ENCOUNTER — Encounter (HOSPITAL_COMMUNITY)
Admission: RE | Disposition: A | Discharge status: Home / Self Care | Payer: Self-pay | Source: Ambulatory Visit | Attending: Surgery

## 2016-08-18 ENCOUNTER — Inpatient Hospital Stay (HOSPITAL_COMMUNITY): Payer: Medicare Other | Admitting: Certified Registered"

## 2016-08-18 ENCOUNTER — Encounter (HOSPITAL_COMMUNITY): Payer: Self-pay | Admitting: *Deleted

## 2016-08-18 DIAGNOSIS — K219 Gastro-esophageal reflux disease without esophagitis: Secondary | ICD-10-CM | POA: Diagnosis not present

## 2016-08-18 DIAGNOSIS — D62 Acute posthemorrhagic anemia: Secondary | ICD-10-CM | POA: Diagnosis not present

## 2016-08-18 DIAGNOSIS — C439 Malignant melanoma of skin, unspecified: Secondary | ICD-10-CM | POA: Diagnosis not present

## 2016-08-18 DIAGNOSIS — C180 Malignant neoplasm of cecum: Secondary | ICD-10-CM | POA: Diagnosis not present

## 2016-08-18 DIAGNOSIS — N179 Acute kidney failure, unspecified: Secondary | ICD-10-CM | POA: Diagnosis not present

## 2016-08-18 DIAGNOSIS — Z933 Colostomy status: Secondary | ICD-10-CM | POA: Diagnosis not present

## 2016-08-18 DIAGNOSIS — Z803 Family history of malignant neoplasm of breast: Secondary | ICD-10-CM | POA: Diagnosis not present

## 2016-08-18 DIAGNOSIS — Z836 Family history of other diseases of the respiratory system: Secondary | ICD-10-CM | POA: Diagnosis not present

## 2016-08-18 DIAGNOSIS — K567 Ileus, unspecified: Secondary | ICD-10-CM | POA: Diagnosis not present

## 2016-08-18 DIAGNOSIS — Z823 Family history of stroke: Secondary | ICD-10-CM | POA: Diagnosis not present

## 2016-08-18 DIAGNOSIS — K631 Perforation of intestine (nontraumatic): Secondary | ICD-10-CM

## 2016-08-18 DIAGNOSIS — K921 Melena: Secondary | ICD-10-CM | POA: Diagnosis not present

## 2016-08-18 DIAGNOSIS — C182 Malignant neoplasm of ascending colon: Secondary | ICD-10-CM | POA: Diagnosis not present

## 2016-08-18 DIAGNOSIS — K660 Peritoneal adhesions (postprocedural) (postinfection): Secondary | ICD-10-CM | POA: Diagnosis not present

## 2016-08-18 DIAGNOSIS — Z8249 Family history of ischemic heart disease and other diseases of the circulatory system: Secondary | ICD-10-CM

## 2016-08-18 DIAGNOSIS — Z433 Encounter for attention to colostomy: Principal | ICD-10-CM

## 2016-08-18 DIAGNOSIS — Z811 Family history of alcohol abuse and dependence: Secondary | ICD-10-CM

## 2016-08-18 DIAGNOSIS — D500 Iron deficiency anemia secondary to blood loss (chronic): Secondary | ICD-10-CM | POA: Diagnosis not present

## 2016-08-18 DIAGNOSIS — C189 Malignant neoplasm of colon, unspecified: Secondary | ICD-10-CM | POA: Diagnosis not present

## 2016-08-18 DIAGNOSIS — Z85038 Personal history of other malignant neoplasm of large intestine: Secondary | ICD-10-CM | POA: Diagnosis present

## 2016-08-18 HISTORY — PX: COLOSTOMY CLOSURE: SHX1381

## 2016-08-18 HISTORY — PX: PARTIAL COLECTOMY: SHX5273

## 2016-08-18 LAB — CBC
HCT: 42.7 % (ref 39.0–52.0)
HEMOGLOBIN: 15 g/dL (ref 13.0–17.0)
MCH: 33.3 pg (ref 26.0–34.0)
MCHC: 35.1 g/dL (ref 30.0–36.0)
MCV: 94.7 fL (ref 78.0–100.0)
Platelets: 228 10*3/uL (ref 150–400)
RBC: 4.51 MIL/uL (ref 4.22–5.81)
RDW: 13.5 % (ref 11.5–15.5)
WBC: 14.8 10*3/uL — AB (ref 4.0–10.5)

## 2016-08-18 LAB — CREATININE, SERUM
CREATININE: 0.79 mg/dL (ref 0.61–1.24)
GFR calc Af Amer: >60 mL/min (ref >60)

## 2016-08-18 SURGERY — COLECTOMY, PARTIAL
Anesthesia: General | Site: Abdomen | Laterality: Right

## 2016-08-18 MED ORDER — MORPHINE SULFATE (PF) 10 MG/ML IV SOLN
1.0000 mg | INTRAVENOUS | Status: DC | PRN
  Administered 2016-08-18 (×5): 2 mg via INTRAVENOUS

## 2016-08-18 MED ORDER — LIDOCAINE 2% (20 MG/ML) 5 ML SYRINGE
INTRAMUSCULAR | Status: AC
  Filled 2016-08-18: qty 10

## 2016-08-18 MED ORDER — PANTOPRAZOLE SODIUM 40 MG IV SOLR
40.0000 mg | Freq: Every day | INTRAVENOUS | Status: DC
  Administered 2016-08-18 – 2016-08-21 (×4): 40 mg via INTRAVENOUS
  Filled 2016-08-18 (×4): qty 40

## 2016-08-18 MED ORDER — ROCURONIUM BROMIDE 10 MG/ML (PF) SYRINGE
PREFILLED_SYRINGE | INTRAVENOUS | Status: DC | PRN
  Administered 2016-08-18 (×2): 10 mg via INTRAVENOUS
  Administered 2016-08-18: 50 mg via INTRAVENOUS
  Administered 2016-08-18 (×3): 10 mg via INTRAVENOUS

## 2016-08-18 MED ORDER — PHENYLEPHRINE 40 MCG/ML (10ML) SYRINGE FOR IV PUSH (FOR BLOOD PRESSURE SUPPORT)
PREFILLED_SYRINGE | INTRAVENOUS | Status: AC
  Filled 2016-08-18: qty 20

## 2016-08-18 MED ORDER — EPHEDRINE 5 MG/ML INJ
INTRAVENOUS | Status: AC
  Filled 2016-08-18: qty 10

## 2016-08-18 MED ORDER — DIPHENHYDRAMINE HCL 12.5 MG/5ML PO ELIX: 12.5000 mg | ORAL_SOLUTION | Freq: Four times a day (QID) | ORAL | Status: DC | PRN

## 2016-08-18 MED ORDER — ONDANSETRON HCL 4 MG/2ML IJ SOLN: 4.0000 mg | Freq: Four times a day (QID) | INTRAMUSCULAR | Status: DC | PRN

## 2016-08-18 MED ORDER — SODIUM CHLORIDE 0.9 % IV BOLUS (SEPSIS)
500.0000 mL | Freq: Once | INTRAVENOUS | Status: AC
  Administered 2016-08-18: 500 mL via INTRAVENOUS

## 2016-08-18 MED ORDER — DEXAMETHASONE SODIUM PHOSPHATE 10 MG/ML IJ SOLN
INTRAMUSCULAR | Status: AC
Start: 2016-08-18 — End: 2016-08-18
  Filled 2016-08-18: qty 1

## 2016-08-18 MED ORDER — SUCCINYLCHOLINE CHLORIDE 200 MG/10ML IV SOSY
PREFILLED_SYRINGE | INTRAVENOUS | Status: AC
  Filled 2016-08-18: qty 20

## 2016-08-18 MED ORDER — LIDOCAINE 2% (20 MG/ML) 5 ML SYRINGE
INTRAMUSCULAR | Status: DC | PRN
  Administered 2016-08-18: 100 mg via INTRAVENOUS

## 2016-08-18 MED ORDER — PROPOFOL 10 MG/ML IV BOLUS
INTRAVENOUS | Status: AC
  Filled 2016-08-18: qty 20

## 2016-08-18 MED ORDER — DEXTROSE 5 % IV SOLN
INTRAVENOUS | Status: DC | PRN
  Administered 2016-08-18: 25 ug/min via INTRAVENOUS

## 2016-08-18 MED ORDER — SODIUM CHLORIDE 0.9% FLUSH: 9.0000 mL | INTRAVENOUS | Status: DC | PRN

## 2016-08-18 MED ORDER — PROMETHAZINE HCL 25 MG/ML IJ SOLN: 6.2500 mg | INTRAMUSCULAR | Status: DC | PRN

## 2016-08-18 MED ORDER — FENTANYL CITRATE (PF) 100 MCG/2ML IJ SOLN
25.0000 ug | INTRAMUSCULAR | Status: DC | PRN
  Administered 2016-08-18 (×3): 50 ug via INTRAVENOUS

## 2016-08-18 MED ORDER — CHLORHEXIDINE GLUCONATE CLOTH 2 % EX PADS: 6.0000 | MEDICATED_PAD | Freq: Once | CUTANEOUS | Status: DC

## 2016-08-18 MED ORDER — ONDANSETRON HCL 4 MG/2ML IJ SOLN
INTRAMUSCULAR | Status: AC
  Filled 2016-08-18: qty 2

## 2016-08-18 MED ORDER — NALOXONE HCL 0.4 MG/ML IJ SOLN: 0.4000 mg | INTRAMUSCULAR | Status: DC | PRN

## 2016-08-18 MED ORDER — ONDANSETRON 4 MG PO TBDP: 4.0000 mg | ORAL_TABLET | Freq: Four times a day (QID) | ORAL | Status: DC | PRN

## 2016-08-18 MED ORDER — ROCURONIUM BROMIDE 50 MG/5ML IV SOSY
PREFILLED_SYRINGE | INTRAVENOUS | Status: AC
  Filled 2016-08-18: qty 5

## 2016-08-18 MED ORDER — SUGAMMADEX SODIUM 200 MG/2ML IV SOLN
INTRAVENOUS | Status: AC
  Filled 2016-08-18: qty 2

## 2016-08-18 MED ORDER — ALVIMOPAN 12 MG PO CAPS: 12.0000 mg | ORAL_CAPSULE | Freq: Two times a day (BID) | ORAL | Status: DC

## 2016-08-18 MED ORDER — PROPOFOL 10 MG/ML IV BOLUS
INTRAVENOUS | Status: DC | PRN
  Administered 2016-08-18: 150 mg via INTRAVENOUS

## 2016-08-18 MED ORDER — FENTANYL 40 MCG/ML IV SOLN
INTRAVENOUS | Status: DC
  Administered 2016-08-18: 170 ug via INTRAVENOUS
  Administered 2016-08-18: 16:00:00 via INTRAVENOUS
  Administered 2016-08-19: 90 ug via INTRAVENOUS
  Administered 2016-08-19: 120 ug via INTRAVENOUS
  Administered 2016-08-19: 30 ug via INTRAVENOUS
  Administered 2016-08-19: 120 ug via INTRAVENOUS
  Administered 2016-08-19: 20 ug via INTRAVENOUS
  Filled 2016-08-18: qty 25

## 2016-08-18 MED ORDER — HYDROMORPHONE HCL 2 MG/ML IJ SOLN
1.0000 mg | INTRAMUSCULAR | Status: DC | PRN
  Administered 2016-08-18: 1 mg via INTRAVENOUS
  Filled 2016-08-18: qty 1

## 2016-08-18 MED ORDER — MORPHINE SULFATE (PF) 10 MG/ML IV SOLN
INTRAVENOUS | Status: AC
  Filled 2016-08-18: qty 1

## 2016-08-18 MED ORDER — ONDANSETRON HCL 4 MG/2ML IJ SOLN
INTRAMUSCULAR | Status: DC | PRN
  Administered 2016-08-18: 4 mg via INTRAVENOUS

## 2016-08-18 MED ORDER — FENTANYL CITRATE (PF) 250 MCG/5ML IJ SOLN
INTRAMUSCULAR | Status: AC
  Filled 2016-08-18: qty 5

## 2016-08-18 MED ORDER — CHLORHEXIDINE GLUCONATE 0.12 % MT SOLN
15.0000 mL | Freq: Two times a day (BID) | OROMUCOSAL | Status: DC
  Administered 2016-08-18 – 2016-08-22 (×9): 15 mL via OROMUCOSAL
  Filled 2016-08-18 (×6): qty 15

## 2016-08-18 MED ORDER — FENTANYL CITRATE (PF) 250 MCG/5ML IJ SOLN
INTRAMUSCULAR | Status: DC | PRN
  Administered 2016-08-18: 100 ug via INTRAVENOUS
  Administered 2016-08-18 (×6): 50 ug via INTRAVENOUS
  Administered 2016-08-18: 100 ug via INTRAVENOUS
  Administered 2016-08-18 (×2): 50 ug via INTRAVENOUS

## 2016-08-18 MED ORDER — FENTANYL CITRATE (PF) 100 MCG/2ML IJ SOLN
INTRAMUSCULAR | Status: AC
  Filled 2016-08-18: qty 2

## 2016-08-18 MED ORDER — MIDAZOLAM HCL 2 MG/2ML IJ SOLN
INTRAMUSCULAR | Status: DC | PRN
  Administered 2016-08-18: 2 mg via INTRAVENOUS

## 2016-08-18 MED ORDER — KCL IN DEXTROSE-NACL 20-5-0.45 MEQ/L-%-% IV SOLN
INTRAVENOUS | Status: DC
  Administered 2016-08-18 – 2016-08-19 (×2): via INTRAVENOUS
  Administered 2016-08-19: 75 mL/h via INTRAVENOUS
  Administered 2016-08-20 – 2016-08-23 (×4): via INTRAVENOUS
  Filled 2016-08-18 (×9): qty 1000

## 2016-08-18 MED ORDER — SODIUM CHLORIDE 0.9 % IV SOLN
1.0000 g | INTRAVENOUS | Status: AC
  Administered 2016-08-18: 1 g via INTRAVENOUS
  Filled 2016-08-18: qty 1

## 2016-08-18 MED ORDER — LACTATED RINGERS IV SOLN
INTRAVENOUS | Status: DC
  Administered 2016-08-18 (×2): via INTRAVENOUS
  Administered 2016-08-18: 1000 mL via INTRAVENOUS
  Administered 2016-08-18 (×2): via INTRAVENOUS

## 2016-08-18 MED ORDER — MIDAZOLAM HCL 2 MG/2ML IJ SOLN
INTRAMUSCULAR | Status: AC
  Filled 2016-08-18: qty 2

## 2016-08-18 MED ORDER — SUGAMMADEX SODIUM 200 MG/2ML IV SOLN
INTRAVENOUS | Status: DC | PRN
  Administered 2016-08-18: 200 mg via INTRAVENOUS

## 2016-08-18 MED ORDER — DEXAMETHASONE SODIUM PHOSPHATE 10 MG/ML IJ SOLN
INTRAMUSCULAR | Status: DC | PRN
  Administered 2016-08-18: 10 mg via INTRAVENOUS

## 2016-08-18 MED ORDER — ENOXAPARIN SODIUM 40 MG/0.4ML ~~LOC~~ SOLN: 40.0000 mg | SUBCUTANEOUS | Status: DC

## 2016-08-18 MED ORDER — DIPHENHYDRAMINE HCL 50 MG/ML IJ SOLN: 12.5000 mg | Freq: Four times a day (QID) | INTRAMUSCULAR | Status: DC | PRN

## 2016-08-18 MED ORDER — 0.9 % SODIUM CHLORIDE (POUR BTL) OPTIME
TOPICAL | Status: DC | PRN
  Administered 2016-08-18: 2000 mL

## 2016-08-18 MED ORDER — ORAL CARE MOUTH RINSE
15.0000 mL | Freq: Two times a day (BID) | OROMUCOSAL | Status: DC
  Administered 2016-08-19 – 2016-08-22 (×5): 15 mL via OROMUCOSAL

## 2016-08-18 MED ORDER — PHENYLEPHRINE HCL 10 MG/ML IJ SOLN
INTRAMUSCULAR | Status: AC
  Filled 2016-08-18: qty 1

## 2016-08-18 SURGICAL SUPPLY — 57 items
BLADE EXTENDED COATED 6.5IN (ELECTRODE) IMPLANT
BLADE HEX COATED 2.75 (ELECTRODE) ×4 IMPLANT
BLADE SURG SZ10 CARB STEEL (BLADE) IMPLANT
CHLORAPREP W/TINT 26ML (MISCELLANEOUS) ×4 IMPLANT
COUNTER NEEDLE 20 DBL MAG RED (NEEDLE) ×8 IMPLANT
COVER MAYO STAND STRL (DRAPES) ×12 IMPLANT
COVER SURGICAL LIGHT HANDLE (MISCELLANEOUS) ×4 IMPLANT
DRAPE LAPAROSCOPIC ABDOMINAL (DRAPES) ×4 IMPLANT
DRAPE SHEET LG 3/4 BI-LAMINATE (DRAPES) ×4 IMPLANT
DRAPE UTILITY XL STRL (DRAPES) ×8 IMPLANT
DRAPE WARM FLUID 44X44 (DRAPE) ×4 IMPLANT
DRSG OPSITE POSTOP 4X10 (GAUZE/BANDAGES/DRESSINGS) ×4 IMPLANT
DRSG OPSITE POSTOP 4X6 (GAUZE/BANDAGES/DRESSINGS) ×12 IMPLANT
DRSG OPSITE POSTOP 4X8 (GAUZE/BANDAGES/DRESSINGS) IMPLANT
DRSG PAD ABDOMINAL 8X10 ST (GAUZE/BANDAGES/DRESSINGS) IMPLANT
ELECT PENCIL ROCKER SW 15FT (MISCELLANEOUS) ×4 IMPLANT
ELECT REM PT RETURN 9FT ADLT (ELECTROSURGICAL) ×4
ELECTRODE REM PT RTRN 9FT ADLT (ELECTROSURGICAL) ×2 IMPLANT
EVACUATOR DRAINAGE 10X20 100CC (DRAIN) IMPLANT
EVACUATOR SILICONE 100CC (DRAIN)
GAUZE SPONGE 4X4 12PLY STRL (GAUZE/BANDAGES/DRESSINGS) IMPLANT
GLOVE SURG ORTHO 8.0 STRL STRW (GLOVE) ×12 IMPLANT
GOWN STRL REUS W/TWL XL LVL3 (GOWN DISPOSABLE) ×24 IMPLANT
HANDLE SUCTION POOLE (INSTRUMENTS) ×4 IMPLANT
KIT BASIN OR (CUSTOM PROCEDURE TRAY) ×8 IMPLANT
LEGGING LITHOTOMY PAIR STRL (DRAPES) ×4 IMPLANT
LUBRICANT JELLY K Y 4OZ (MISCELLANEOUS) ×4 IMPLANT
PACK GENERAL/GYN (CUSTOM PROCEDURE TRAY) ×4 IMPLANT
RELOAD PROXIMATE 75MM BLUE (ENDOMECHANICALS) ×12 IMPLANT
SEALER TISSUE X1 CVD JAW (INSTRUMENTS) IMPLANT
SPONGE LAP 18X18 X RAY DECT (DISPOSABLE) ×16 IMPLANT
STAPLER GUN LINEAR PROX 60 (STAPLE) ×4 IMPLANT
STAPLER PROXIMATE 75MM BLUE (STAPLE) ×4 IMPLANT
STAPLER VISISTAT 35W (STAPLE) ×8 IMPLANT
SUCTION POOLE HANDLE (INSTRUMENTS) ×8
SUT ETHILON 3 0 PS 1 (SUTURE) IMPLANT
SUT NOV 1 T60/GS (SUTURE) IMPLANT
SUT NOVA NAB DX-16 0-1 5-0 T12 (SUTURE) ×28 IMPLANT
SUT NOVA T20/GS 25 (SUTURE) IMPLANT
SUT PDS AB 1 TP1 96 (SUTURE) IMPLANT
SUT PROLENE 2 0 SH DA (SUTURE) ×4 IMPLANT
SUT SILK 2 0 (SUTURE) ×4
SUT SILK 2 0 SH CR/8 (SUTURE) ×12 IMPLANT
SUT SILK 2 0SH CR/8 30 (SUTURE) IMPLANT
SUT SILK 2-0 18XBRD TIE 12 (SUTURE) ×2 IMPLANT
SUT SILK 2-0 30XBRD TIE 12 (SUTURE) ×2 IMPLANT
SUT SILK 3 0 (SUTURE) ×2
SUT SILK 3 0 SH CR/8 (SUTURE) ×4 IMPLANT
SUT SILK 3-0 18XBRD TIE 12 (SUTURE) ×2 IMPLANT
SUT VIC AB 4-0 SH 18 (SUTURE) IMPLANT
SYR BULB IRRIGATION 50ML (SYRINGE) ×4 IMPLANT
TOWEL OR 17X26 10 PK STRL BLUE (TOWEL DISPOSABLE) ×8 IMPLANT
TOWEL OR NON WOVEN STRL DISP B (DISPOSABLE) ×4 IMPLANT
TRAY FOLEY W/METER SILVER 16FR (SET/KITS/TRAYS/PACK) ×4 IMPLANT
TUBING CONNECTING 10 (TUBING) IMPLANT
TUBING CONNECTING 10' (TUBING)
YANKAUER SUCT BULB TIP 10FT TU (MISCELLANEOUS) IMPLANT

## 2016-08-18 NOTE — Transfer of Care (Signed)
Immediate Anesthesia Transfer of Care Note  Patient: [name] Brown  Procedure(s) Performed: Procedure(s): RIGHT COLECTOMY (Right) COLOSTOMY CLOSURE OPEN PROCEDURE (N/A)  Patient Location: PACU  Anesthesia Type:General  Level of Consciousness:  sedated, patient cooperative and responds to stimulation  Airway & Oxygen Therapy:Patient Spontanous Breathing and Patient connected to face mask oxgen  Post-op Assessment:  Report given to PACU RN and Post -op Vital signs reviewed and stable  Post vital signs:  Reviewed and stable  Last Vitals:  Vitals:   08/18/16 1011  BP: (!) 152/92  Pulse: 70  Resp: 18  Temp: [id number] C    Complications: No apparent anesthesia complications

## 2016-08-18 NOTE — Progress Notes (Signed)
Entereg Policy per P&T  A/P: Patient did not receive pre-op dose so post op doses per policy were not started.   Adrian Saran, PharmD, BCPS 08/18/2016 5:05 PM

## 2016-08-18 NOTE — Brief Op Note (Signed)
08/18/2016  3:25 PM  PATIENT:  [name] Brown  65 y.o. male  PRE-OPERATIVE DIAGNOSIS:  RIGHT COLON CANCER, PRESENCE OF COLOSTOMY  POST-OPERATIVE DIAGNOSIS:  RIGHT COLON CANCER, PRESENCE OF COLOSTOMY  PROCEDURE:  Procedure(s): RIGHT COLECTOMY (Right) COLOSTOMY CLOSURE OPEN PROCEDURE (N/A)  SURGEON:  Surgeon(s) and Role:    * Armandina Gemma, MD - Primary    * Donnie Mesa, MD - Assisting  ANESTHESIA:   general  EBL:  Total I/O In: 3000 [I.V.:3000] Out: 450 [Urine:250; Blood:200]  BLOOD ADMINISTERED:none  DRAINS: none   LOCAL MEDICATIONS USED:  NONE  SPECIMEN:  Excision  DISPOSITION OF SPECIMEN:  PATHOLOGY  COUNTS:  YES  TOURNIQUET:  * No tourniquets in log *  DICTATION: .Other Dictation: Dictation Number [id number]  PLAN OF CARE: Admit to inpatient   PATIENT DISPOSITION:  PACU - hemodynamically stable.   Delay start of Pharmacological VTE agent (>24hrs) due to surgical blood loss or risk of bleeding: yes  Earnstine Regal, MD, [facility] Office: [phone]

## 2016-08-18 NOTE — Anesthesia Preprocedure Evaluation (Signed)
Anesthesia Evaluation  Patient identified by MRN, date of birth, ID band Patient awake    Reviewed: Allergy & Precautions, NPO status , Patient's Chart, lab work & pertinent test results  History of Anesthesia Complications (+) Family history of anesthesia reaction  Airway Mallampati: II  TM Distance: >3 FB Neck ROM: Full    Dental  (+) Teeth Intact, Dental Advisory Given   Pulmonary    breath sounds clear to auscultation       Cardiovascular hypertension,  Rhythm:Regular Rate:Normal     Neuro/Psych negative neurological ROS     GI/Hepatic Neg liver ROS, PUD, GERD  ,  Endo/Other  negative endocrine ROS  Renal/GU negative Renal ROS     Musculoskeletal negative musculoskeletal ROS (+)   Abdominal   Peds  Hematology negative hematology ROS (+)   Anesthesia Other Findings   Reproductive/Obstetrics                             Anesthesia Physical Anesthesia Plan  ASA: II  Anesthesia Plan: General   Post-op Pain Management:    Induction: Intravenous  Airway Management Planned: Oral ETT  Additional Equipment:   Intra-op Plan:   Post-operative Plan: Extubation in OR  Informed Consent: I have reviewed the patients History and Physical, chart, labs and discussed the procedure including the risks, benefits and alternatives for the proposed anesthesia with the patient or authorized representative who has indicated his/her understanding and acceptance.   Dental advisory given  Plan Discussed with: CRNA  Anesthesia Plan Comments:         Anesthesia Quick Evaluation

## 2016-08-18 NOTE — Interval H&P Note (Signed)
History and Physical Interval Note:  08/18/2016 11:35 AM  [name] Brown  has presented today for surgery, with the diagnosis of RIGHT COLON CANCER, PRESENCE OF COLOSTOMY.  The various methods of treatment have been discussed with the patient and family. After consideration of risks, benefits and other options for treatment, the patient has consented to    Procedure(s): RIGHT COLECTOMY (Right) COLOSTOMY CLOSURE OPEN PROCEDURE (N/A) as a surgical intervention .    The patient's history has been reviewed, patient examined, no change in status, stable for surgery.  I have reviewed the patient's chart and labs.  Questions were answered to the patient's satisfaction.    Earnstine Regal, MD, [facility] Office: [place]

## 2016-08-18 NOTE — Anesthesia Procedure Notes (Signed)
Procedure Name: Intubation Date/Time: 08/18/2016 12:14 PM Performed by: Cynda Familia Pre-anesthesia Checklist: Patient identified, Emergency Drugs available, Suction available and Patient being monitored Patient Re-evaluated:Patient Re-evaluated prior to inductionOxygen Delivery Method: Circle System Utilized Preoxygenation: Pre-oxygenation with 100% oxygen Intubation Type: IV induction and Cricoid Pressure applied Ventilation: Mask ventilation without difficulty Laryngoscope Size: Miller and 2 Grade View: Grade I Tube type: Oral Tube size: 7.0 mm Number of attempts: 1 Airway Equipment and Method: Stylet Placement Confirmation: ETT inserted through vocal cords under direct vision,  positive ETCO2 and breath sounds checked- equal and bilateral Secured at: 22 cm Tube secured with: Tape Dental Injury: Teeth and Oropharynx as per pre-operative assessment  Comments: Smooth IV induction [place] intubation AM CRNA atraumatic--- teeth and mouth as preop-- bilat BS [place]

## 2016-08-18 NOTE — Anesthesia Postprocedure Evaluation (Signed)
Anesthesia Post Note  Patient: [name] Brown  Procedure(s) Performed: Procedure(s) (LRB): RIGHT COLECTOMY (Right) COLOSTOMY CLOSURE OPEN PROCEDURE (N/A)  Patient location during evaluation: PACU Anesthesia Type: General Level of consciousness: awake, sedated, oriented and patient cooperative Pain management: satisfactory to patient Vital Signs Assessment: post-procedure vital signs reviewed and stable Respiratory status: spontaneous breathing, nonlabored ventilation, respiratory function stable and patient connected to nasal cannula oxygen Cardiovascular status: blood pressure returned to baseline and stable Postop Assessment: no signs of nausea or vomiting Anesthetic complications: no    Last Vitals:  Vitals:   08/18/16 1011 08/18/16 1530  BP: (!) 152/92 (!) 163/97  Pulse: 70 83  Resp: 18 12  Temp: 36.4 C 36.6 C    Last Pain:  Vitals:   08/18/16 1011  TempSrc: Oral                 Carrell Palmatier,JAMES TERRILL

## 2016-08-18 NOTE — Progress Notes (Signed)
Paged MD about seizure-like activity for one minute when standing beside bed. Vs BP 83/59 pulse 118, 99% RA. Patient was attempting to get out of bed to go to the bathroom felt as if he needed to have a BM. Moderate amount of rectal bleeding after sitting back down on the bed. Per nursing tech there was a moderate amount of shaking and his eyes were rolling back in his head.  Received verbal orders for 538mL bolus and MD confirmed labs were being drawn in the am assuming this is a vaso-vagal response. Also to assess orthostatic vitals. Will reassess patient frequently and cont to monitor.

## 2016-08-19 LAB — CBC
HEMATOCRIT: 29.6 % — AB (ref 39.0–52.0)
Hemoglobin: 10 g/dL — ABNORMAL LOW (ref 13.0–17.0)
MCH: 33 pg (ref 26.0–34.0)
MCHC: 33.8 g/dL (ref 30.0–36.0)
MCV: 97.7 fL (ref 78.0–100.0)
PLATELETS: 302 10*3/uL (ref 150–400)
RBC: 3.03 MIL/uL — ABNORMAL LOW (ref 4.22–5.81)
RDW: 13.7 % (ref 11.5–15.5)
WBC: 18.3 10*3/uL — AB (ref 4.0–10.5)

## 2016-08-19 LAB — HEMOGLOBIN AND HEMATOCRIT, BLOOD
HEMATOCRIT: 24.3 % — AB (ref 39.0–52.0)
HEMOGLOBIN: 8.5 g/dL — AB (ref 13.0–17.0)

## 2016-08-19 LAB — BASIC METABOLIC PANEL
Anion gap: 12 (ref 5–15)
BUN: 18 mg/dL (ref 6–20)
CALCIUM: 7.1 mg/dL — AB (ref 8.9–10.3)
CO2: 19 mmol/L — ABNORMAL LOW (ref 22–32)
CREATININE: 1.92 mg/dL — AB (ref 0.61–1.24)
Chloride: 107 mmol/L (ref 101–111)
GFR calc Af Amer: 41 mL/min — ABNORMAL LOW (ref >60)
GFR, EST NON AFRICAN AMERICAN: 35 mL/min — AB (ref >60)
Glucose, Bld: 352 mg/dL — ABNORMAL HIGH (ref 65–99)
POTASSIUM: 4.9 mmol/L (ref 3.5–5.1)
SODIUM: 138 mmol/L (ref 135–145)

## 2016-08-19 LAB — MAGNESIUM: MAGNESIUM: 1.7 mg/dL (ref 1.7–2.4)

## 2016-08-19 MED ORDER — DIAZEPAM 5 MG PO TABS
5.0000 mg | ORAL_TABLET | Freq: Four times a day (QID) | ORAL | Status: DC | PRN
  Administered 2016-08-20 – 2016-08-22 (×5): 5 mg via ORAL
  Filled 2016-08-19 (×5): qty 1

## 2016-08-19 MED ORDER — DIPHENHYDRAMINE HCL 12.5 MG/5ML PO ELIX: 12.5000 mg | ORAL_SOLUTION | Freq: Four times a day (QID) | ORAL | Status: DC | PRN

## 2016-08-19 MED ORDER — DIAZEPAM 5 MG PO TABS
5.0000 mg | ORAL_TABLET | ORAL | Status: AC
  Administered 2016-08-19: 5 mg via ORAL
  Filled 2016-08-19: qty 1

## 2016-08-19 MED ORDER — SODIUM CHLORIDE 0.9% FLUSH: 9.0000 mL | INTRAVENOUS | Status: DC | PRN

## 2016-08-19 MED ORDER — NALOXONE HCL 0.4 MG/ML IJ SOLN: 0.4000 mg | INTRAMUSCULAR | Status: DC | PRN

## 2016-08-19 MED ORDER — DIPHENHYDRAMINE HCL 50 MG/ML IJ SOLN: 12.5000 mg | Freq: Four times a day (QID) | INTRAMUSCULAR | Status: DC | PRN

## 2016-08-19 MED ORDER — FENTANYL 40 MCG/ML IV SOLN
INTRAVENOUS | Status: DC
  Administered 2016-08-19: 40 ug via INTRAVENOUS
  Administered 2016-08-19: 360 ug via INTRAVENOUS
  Administered 2016-08-20: 315 ug via INTRAVENOUS
  Administered 2016-08-20: 75 ug via INTRAVENOUS
  Administered 2016-08-21: 120 ug via INTRAVENOUS
  Administered 2016-08-21: 150 ug via INTRAVENOUS
  Administered 2016-08-21: 135 ug via INTRAVENOUS
  Administered 2016-08-21: 03:00:00 via INTRAVENOUS
  Filled 2016-08-19 (×2): qty 25

## 2016-08-19 MED ORDER — CALCIUM GLUCONATE 10 % IV SOLN
2.0000 g | Freq: Once | INTRAVENOUS | Status: AC
  Administered 2016-08-19: 2 g via INTRAVENOUS
  Filled 2016-08-19: qty 20

## 2016-08-19 MED ORDER — ONDANSETRON HCL 4 MG/2ML IJ SOLN: 4.0000 mg | Freq: Four times a day (QID) | INTRAMUSCULAR | Status: DC | PRN

## 2016-08-19 MED ORDER — SODIUM CHLORIDE 0.9 % IV BOLUS (SEPSIS)
500.0000 mL | Freq: Once | INTRAVENOUS | Status: AC
  Administered 2016-08-19: 500 mL via INTRAVENOUS

## 2016-08-19 NOTE — Progress Notes (Signed)
General Surgery [facility]  Assessment & Plan:  Status post right colectomy and colostomy closure POD#1  Begin clear liquid diet - on Entereg  OOB to chair  One episode BRBPR - will repeat Hgb at 1230PM today & hold Lovenox for another 24 hours Acute blood loss anemia  EBL in OR approx 250cc  Likely largely dilutional  Monitor Hgb, signs of bleeding Hypocalcemia  Will give IV calcium gluconate this AM x one dose Vasovagal response, possible "seizure" activity  No history of seizures  Likely syncopal episode upon standing at bedside  Continue to monitor Adenocarcinoma of colon  Await path results - probably Monday  Dr. Julieanne Manson aware of patient and will see in consultation in approx 3 weeks        Earnstine Regal, MD, [facility]       Office: [phone]    Subjective: Patient in bed, wife at bedside.  No complaints this AM except "thirsty".  No further bleeding episodes.  Objective: Vital signs in last 24 hours: Temp:  [97.2 F (36.2 C)-98.4 F (36.9 C)] 97.5 F (36.4 C) (12/08 0553) Pulse Rate:  [70-118] 102 (12/08 0531) Resp:  [10-18] 17 (12/08 0734) BP: (82-163)/(38-97) 119/55 (12/08 0531) SpO2:  [95 %-100 %] 95 % (12/08 0734) Weight:  [76.7 kg (169 lb)] 76.7 kg (169 lb) (12/07 1013) Last BM Date: 08/18/16  Intake/Output from previous day: 12/07 0701 - 12/08 0700 In: 4700 [I.V.:4700] Out: 975 [Urine:700; Blood:275] Intake/Output this shift: No intake/output data recorded.  Physical Exam: HEENT - sclerae clear, mucous membranes moist Neck - soft Chest - clear bilaterally Cor - RRR Abdomen - soft, dressings dry and intact; few BS present Ext - no edema, non-tender Neuro - alert & oriented, no focal deficits  Lab Results:   Recent Labs  08/18/16 1717 08/19/16 0412  WBC 14.8* 18.3*  HGB 15.0 10.0*  HCT 42.7 29.6*  PLT 228 302   BMET  Recent Labs  08/16/16 0955 08/18/16 1717  08/19/16 0412  NA 137  --  138  K 4.9  --  4.9  CL 102  --  107  CO2 27  --  19*  GLUCOSE 82  --  352*  BUN 23*  --  18  CREATININE 1.18 0.79 1.92*  CALCIUM 9.5  --  7.1*   PT/INR No results for input(s): LABPROT, INR in the last 72 hours. Comprehensive Metabolic Panel:    Component Value Date/Time   NA 138 08/19/2016 0412   NA 137 08/16/2016 0955   K 4.9 08/19/2016 0412   K 4.9 08/16/2016 0955   CL 107 08/19/2016 0412   CL 102 08/16/2016 0955   CO2 19 (L) 08/19/2016 0412   CO2 27 08/16/2016 0955   BUN 18 08/19/2016 0412   BUN 23 (H) 08/16/2016 0955   CREATININE 1.92 (H) 08/19/2016 0412   CREATININE 0.79 08/18/2016 1717   GLUCOSE 352 (H) 08/19/2016 0412   GLUCOSE 82 08/16/2016 0955   CALCIUM 7.1 (L) 08/19/2016 0412   CALCIUM 9.5 08/16/2016 0955   AST 52 (H) 05/02/2016 1404   AST 27 04/25/2016 1459   ALT 46 05/02/2016 1404   ALT 30 04/25/2016 1459   ALKPHOS 204 (H) 05/02/2016 1404   ALKPHOS 50 04/25/2016 1459   BILITOT 0.8 05/02/2016 1404   BILITOT 1.4 (H) 04/25/2016 1459   PROT 6.9 05/02/2016 1404   PROT 7.7 04/25/2016 1459  ALBUMIN 3.4 (L) 05/02/2016 1404   ALBUMIN 4.8 04/25/2016 1459    Studies/Results: No results found.    Beauden Tremont M 08/19/2016  Patient ID: [name], male   DOB: 1951-06-21, 65 y.o.   MRN: [id number]

## 2016-08-19 NOTE — Op Note (Signed)
NAMEDAMIEAN, MITSCH NO.:  [id number]  MEDICAL RECORD NO.:  [id number]  LOCATION:                               FACILITY:  [facility]  PHYSICIAN:  Earnstine Regal, MD      DATE OF BIRTH:  1951-01-19  DATE OF PROCEDURE:  08/18/2016                               OPERATIVE REPORT   PREOPERATIVE DIAGNOSES: 1. Right colon carcinoma. 2. Presence of colostomy.  POSTOPERATIVE DIAGNOSES: 1. Right colon carcinoma. 2. Presence of colostomy.  PROCEDURE: 1. Right colectomy. 2. Colostomy closure.  SURGEON:  Earnstine Regal, MD, FACS  ASSISTANT:  Imogene Burn. Tsuei, M.D.  ANESTHESIA:  General per Dr. Ala Dach.  ESTIMATED BLOOD LOSS:  250 mL.  PREPARATION:  Betadine and ChloraPrep.  COMPLICATIONS:  None.  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 After convalescing for approximately 3 months, the patient was prepared for colostomy closure.  Part of his preoperative evaluation included colonoscopy by Dr. Clarene Essex.  The patient was found to have neoplasm in the right colon adjacent to the ileocecal valve.  Biopsy showed invasive adenocarcinoma.  The patient now comes to the operating room for right colectomy for management of colon carcinoma and colostomy closure.  BODY OF REPORT:  Procedure was done in OR #4 at [facility].  The patient was brought to the operating room, placed in a supine position on the operating room table.  Following administration of general anesthesia, the patient was placed in lithotomy and then prepped and draped in the usual aseptic fashion.  After ascertaining that an adequate level of anesthesia had been achieved, the midline surgical scar was excised with a #10 blade.  Dissection was carried into the subcutaneous tissues.  Previous suture material was extracted. Fascia was incised in the midline and the  peritoneal cavity was entered cautiously.  Adhesions of the omentum to the anterior abdominal wall were taken down.  Small bowel was run from the ligament of Treitz to the ileocecal valve and adhesions were lysed sharply.  Right colectomy is the initial procedure.  The right colon is identified.  Adhesions of the cecum were taken down sharply.  Appendix is present and appears normal.  Terminal ileum appears normal. Palpation of the cecum reveals approximately a 2-cm mass in the anterior cecum adjacent to the ileocecal valve consistent with carcinoma. Peritoneum was incised along the distal ileum and around the base of the cecum allowing for complete mobilization of the appendix.  Peritoneum was incised along the lateral peritoneal reflection at the white line allowing for mobilization of the right colon.  A point in the terminal ileum approximately 10 cm from the ileocecal valve was selected and transected with a GIA stapler.  Mesentery was divided with the Ethicon EnSeal.  Right colic vessels were identified, divided between Kelly clamps, and ligated with 2-0 silk ties.  Right colon was further mobilized around the hepatic flexure.  Duodenum was identified and preserved.  Dissection was carried to the proximal transverse colon where the transverse colon was then transected with the GIA stapler. Remaining mesentery was divided  with the Ethicon EnSeal.  The entire right colon was submitted to pathology as specimen for review.  Next, a side-to-side functionally end-to-end anastomosis was created between the terminal ileum and proximal transverse colon.  This was performed with a GIA stapler.  Enterotomy was closed with a TA-60 stapler.  Anastomosis appears to be widely patent on palpation. Mesenteric defect was closed with interrupted 2-0 silk sutures.  Dry packs were placed in the right abdomen.  Next, we turned our attention to the colostomy.  The colostomy was excised from the skin  with an elliptical incision made with a #10 blade. Dissection was carried down through the subcutaneous tissues and the colostomy was completely freed from the abdominal wall and reduced back within the peritoneal cavity.  Colostomy was resected back approximately 4 cm to healthy descending colon.  A 2-0 Prolene pursestring was then placed in the end of the descending colon.  Using sizers, a 29 mm Ethicon EEA stapler was selected for the anastomosis.  The anvil was placed into the distal descending colon and the pursestring suture tied securely.  Examination of the rectum shows it to appear grossly normal.  The previous staple line was identified at the site of Prolene sutures.  The anterior wall of the rectum was cleared.  At this point, Dr. Donnie Mesa dilated the anus and inserted a 85 Sizer into the rectum, which was easily advanced to the previous staple line.  Dilator was removed.  Stapler was inserted and advanced to the staple line.  It was then angled anteriorly so as to use the anterior wall of the proximal rectum for the anastomosis.  Trocar was advanced. Anvil was assembled and the stapler closed and fired in the usual fashion.  Two complete rings of tissue are extracted with the stapler. The distal descending colon was occluded manually and the rectum is insufflated with air using the rigid proctoscope.  The bowel is adequately distended, and there was no sign of air leak at the anastomosis.  Air was evacuated and the proctoscope was removed.  Abdomen was irrigated copiously with warm saline.  Good hemostasis was noted throughout the abdomen.  Small bowel was oriented properly. Omentum was used to cover the small bowel.  The colostomy abdominal wall defect was closed with interrupted #1 Novafil simple sutures.  Midline incision was closed with interrupted #1 Novafil simple sutures.  Subcutaneous tissues were irrigated and good hemostasis noted.  Skin of both incisions  were closed with stainless steel staples. Sterile dressings were applied.  The patient was awakened from anesthesia and brought to the recovery room.  The patient tolerated the procedure well.   Earnstine Regal, MD, [facility] Office: [phone]    TMG/MEDQ  D:  08/18/2016  T:  08/19/2016  Job:  [id number]  cc:   Jeryl Columbia, M.D. Fax: [phone]

## 2016-08-19 NOTE — Progress Notes (Signed)
Report given to [facility] on [facility] prior to transfer.   A/O, VSS, temperature 97.5 oral. Medications transferred along with patients chart. All patient items transferred to [facility] with wife and another Therapist, sports.

## 2016-08-19 NOTE — Progress Notes (Signed)
68mcg Fentanyl wasted in sink, witnessed by Cristopher Peru

## 2016-08-19 NOTE — Progress Notes (Signed)
Patient ID: [name] Brown, male   DOB: 1951-04-13, 65 y.o.   MRN: [id number]  [facility]  Patient with some pain, having "spasms" of abdominal wall.  No further BM's or evidence of bleeding.  Hgb down to 8.5 but patient is 5 liters positive over past 36 hours.  Will increase PCA to full dose.  Add oral Valium for spasms.  Decrease IV fluid rate.  Check Hgb in AM 12/9.  Earnstine Regal, MD, [facility] Office: [phone]

## 2016-08-19 NOTE — Progress Notes (Signed)
Patient refused to ambulate, stated he's having spasms and having  Pain, encouraged to use PCA.

## 2016-08-19 NOTE — Progress Notes (Signed)
Patient complained of feeling bloated, but not in pain, this Rn asked if he walked today, pt stated I was told not to . MD paged and was told to ambulate patient.

## 2016-08-19 NOTE — Progress Notes (Signed)
MD paged about patient's vasovagal responses/syncope when attempting to get out of bed feeling as if he need to have a BM. Patient got up to bedside commode and started shaking and became unresponsive on the commode. Rapid was called. Received orders from MD for a bolus of 557mL, Stat CBC and Stat H&H.  EKG showed multiple PVCs MD paged and received verbal orders to transfer to tele floor for cardiac monitoring.

## 2016-08-20 LAB — CBC
HEMATOCRIT: 19.9 % — AB (ref 39.0–52.0)
Hemoglobin: 6.9 g/dL — CL (ref 13.0–17.0)
MCH: 33.3 pg (ref 26.0–34.0)
MCHC: 34.7 g/dL (ref 30.0–36.0)
MCV: 96.1 fL (ref 78.0–100.0)
Platelets: 195 10*3/uL (ref 150–400)
RBC: 2.07 MIL/uL — ABNORMAL LOW (ref 4.22–5.81)
RDW: 14 % (ref 11.5–15.5)
WBC: 14.4 10*3/uL — ABNORMAL HIGH (ref 4.0–10.5)

## 2016-08-20 LAB — BASIC METABOLIC PANEL
Anion gap: 7 (ref 5–15)
BUN: 25 mg/dL — ABNORMAL HIGH (ref 6–20)
CALCIUM: 7.8 mg/dL — AB (ref 8.9–10.3)
CO2: 24 mmol/L (ref 22–32)
CREATININE: 1.93 mg/dL — AB (ref 0.61–1.24)
Chloride: 107 mmol/L (ref 101–111)
GFR calc Af Amer: 40 mL/min — ABNORMAL LOW (ref >60)
GFR calc non Af Amer: 35 mL/min — ABNORMAL LOW (ref >60)
GLUCOSE: 121 mg/dL — AB (ref 65–99)
Potassium: 4.8 mmol/L (ref 3.5–5.1)
Sodium: 138 mmol/L (ref 135–145)

## 2016-08-20 LAB — PREPARE RBC (CROSSMATCH)

## 2016-08-20 LAB — HEMOGLOBIN AND HEMATOCRIT, BLOOD
HCT: 22.7 % — ABNORMAL LOW (ref 39.0–52.0)
Hemoglobin: 7.8 g/dL — ABNORMAL LOW (ref 13.0–17.0)

## 2016-08-20 MED ORDER — SODIUM CHLORIDE 0.9 % IV SOLN
Freq: Once | INTRAVENOUS | Status: AC
  Administered 2016-08-20: 11:00:00 via INTRAVENOUS

## 2016-08-20 NOTE — Progress Notes (Signed)
CRITICAL VALUE ALERT  Critical value received:  Hgb 6.9  Date of notification: 08/20/16  Time of notification:  [id number]  Critical value read back:Yes.    Nurse who received alert:  Reynold Bowen, RN  MD notified (1st page): Leighton Ruff, MD  Time of first page: [phone]  MD notified (2nd page):  Time of second page:  Responding MD:  Leighton Ruff, MD  Time MD responded:  [phone]  MD ordered to transfuse 1 unit of blood

## 2016-08-20 NOTE — Progress Notes (Signed)
Assumed care of patient at . Agree with previous Nurse assessment.  Mahmud Keithly M. Kieana Livesay, RN  

## 2016-08-20 NOTE — Progress Notes (Signed)
Assessment Principal Problem:     Cancer of right colon ([place]) and colostomy s/p right colectomy and colostomy closure 08/18/16-small amount of blood per rectum this AM.     ABL-anemia with hemoglobin 6.9 this AM; likely due to bleeding from one of the anastomosis; no significant bleeding per rectum now    AKI-May be pre-renal due to acute blood loss   Plan:  One unit of PRBCs ordered. Support with blood products as needed. Will repeat hemoglobin 2 hours after this. Monitor creatinine.   LOS: 2 days     2 Days Post-Op  Subjective: Had a small amount of blood per rectum when he wiped this AM.  Objective: Vital signs in last 24 hours: Temp:  [97.8 F (36.6 C)-99.4 F (37.4 C)] 98.9 F (37.2 C) (12/09 1106) Pulse Rate:  [100-120] 107 (12/09 1106) Resp:  [12-20] 18 (12/09 1106) BP: (129-150)/(55-71) 133/68 (12/09 1106) SpO2:  [95 %-100 %] 100 % (12/09 1106) Last BM Date: 08/18/16  Intake/Output from previous day: 12/08 0701 - 12/09 0700 In: 1916.3 [P.O.:600; I.V.:1316.3] Out: 1050 [Urine:1050] Intake/Output this shift: No intake/output data recorded.  PE: General- In NAD Abdomen-soft, dressings with dried drainage, few bowels  Lab Results:   Recent Labs  08/19/16 0412 08/19/16 1300 08/20/16 0511  WBC 18.3*  --  14.4*  HGB 10.0* 8.5* 6.9*  HCT 29.6* 24.3* 19.9*  PLT 302  --  195   BMET  Recent Labs  08/19/16 0412 08/20/16 0511  NA 138 138  K 4.9 4.8  CL 107 107  CO2 19* 24  GLUCOSE 352* 121*  BUN 18 25*  CREATININE 1.92* 1.93*  CALCIUM 7.1* 7.8*   PT/INR No results for input(s): LABPROT, INR in the last 72 hours. Comprehensive Metabolic Panel:    Component Value Date/Time   NA 138 08/20/2016 0511   NA 138 08/19/2016 0412   K 4.8 08/20/2016 0511   K 4.9 08/19/2016 0412   CL 107 08/20/2016 0511   CL 107 08/19/2016 0412   CO2 24 08/20/2016 0511   CO2 19 (L) 08/19/2016 0412   BUN 25 (H) 08/20/2016 0511   BUN 18 08/19/2016 0412   CREATININE 1.93  (H) 08/20/2016 0511   CREATININE 1.92 (H) 08/19/2016 0412   GLUCOSE 121 (H) 08/20/2016 0511   GLUCOSE 352 (H) 08/19/2016 0412   CALCIUM 7.8 (L) 08/20/2016 0511   CALCIUM 7.1 (L) 08/19/2016 0412   AST 52 (H) 05/02/2016 1404   AST 27 04/25/2016 1459   ALT 46 05/02/2016 1404   ALT 30 04/25/2016 1459   ALKPHOS 204 (H) 05/02/2016 1404   ALKPHOS 50 04/25/2016 1459   BILITOT 0.8 05/02/2016 1404   BILITOT 1.4 (H) 04/25/2016 1459   PROT 6.9 05/02/2016 1404   PROT 7.7 04/25/2016 1459   ALBUMIN 3.4 (L) 05/02/2016 1404   ALBUMIN 4.8 04/25/2016 1459     Studies/Results: No results found.  Anti-infectives: Anti-infectives    Start     Dose/Rate Route Frequency Ordered Stop   08/18/16 1015  ertapenem (INVANZ) 1 g in sodium chloride 0.9 % 50 mL IVPB     1 g 100 mL/hr over 30 Minutes Intravenous To Short Stay 08/18/16 1002 08/18/16 1216       Taryn Nave J 08/20/2016

## 2016-08-20 NOTE — Progress Notes (Signed)
Per pt and NT, pt had some bright red blood and clotted blood when he wiped his rectum.  Paged Dr. Harlow Asa through answering service.

## 2016-08-21 LAB — CBC
HEMATOCRIT: 21.2 % — AB (ref 39.0–52.0)
HEMOGLOBIN: 7.4 g/dL — AB (ref 13.0–17.0)
MCH: 32.3 pg (ref 26.0–34.0)
MCHC: 34.9 g/dL (ref 30.0–36.0)
MCV: 92.6 fL (ref 78.0–100.0)
Platelets: 157 10*3/uL (ref 150–400)
RBC: 2.29 MIL/uL — AB (ref 4.22–5.81)
RDW: 15.5 % (ref 11.5–15.5)
WBC: 7.5 10*3/uL (ref 4.0–10.5)

## 2016-08-21 LAB — HEMOGLOBIN AND HEMATOCRIT, BLOOD
HEMATOCRIT: 21.8 % — AB (ref 39.0–52.0)
Hemoglobin: 7.6 g/dL — ABNORMAL LOW (ref 13.0–17.0)

## 2016-08-21 LAB — BASIC METABOLIC PANEL
ANION GAP: 5 (ref 5–15)
BUN: 12 mg/dL (ref 6–20)
CALCIUM: 7.9 mg/dL — AB (ref 8.9–10.3)
CO2: 27 mmol/L (ref 22–32)
Chloride: 104 mmol/L (ref 101–111)
Creatinine, Ser: 1.11 mg/dL (ref 0.61–1.24)
GFR calc non Af Amer: >60 mL/min (ref >60)
Glucose, Bld: 107 mg/dL — ABNORMAL HIGH (ref 65–99)
POTASSIUM: 3.9 mmol/L (ref 3.5–5.1)
Sodium: 136 mmol/L (ref 135–145)

## 2016-08-21 MED ORDER — HYDROMORPHONE HCL 1 MG/ML IJ SOLN
0.5000 mg | INTRAMUSCULAR | Status: DC | PRN
  Administered 2016-08-21 (×2): 1 mg via INTRAVENOUS
  Administered 2016-08-21 – 2016-08-22 (×4): 0.5 mg via INTRAVENOUS
  Administered 2016-08-22: 1 mg via INTRAVENOUS
  Administered 2016-08-22: 0.5 mg via INTRAVENOUS
  Filled 2016-08-21: qty 1
  Filled 2016-08-21 (×3): qty 0.5
  Filled 2016-08-21 (×2): qty 1
  Filled 2016-08-21: qty 0.5
  Filled 2016-08-21: qty 1
  Filled 2016-08-21: qty 0.5

## 2016-08-21 NOTE — Plan of Care (Signed)
Problem: Education: Goal: Knowledge of  General Education information/materials will improve Outcome: Completed/Met Date Met: 08/21/16 Discussed continued plan of care with pt. He verbalized understanding  Problem: Safety: Goal: Ability to remain free from injury will improve Outcome: Completed/Met Date Met: 08/21/16 Discussed safety prevention plan. Pt agreeable and is aware to call for assistance. Bed alarm set

## 2016-08-21 NOTE — Progress Notes (Signed)
Assessment Principal Problem:     Cancer of right colon ([place]) and colostomy s/p right colectomy and colostomy closure 08/18/16-small amount of blood per rectum again this AM. No nausea.  Less pain.    ABL-anemia with hemoglobin 7.4 this AM    AKI-improved.   Plan:  Stop PCA.  Ambulate.  Recheck hemoglobin this afternoon.  Continue clear liquids.    LOS: 3 days     3 Days Post-Op  Subjective: Had a small amount amount of liquid blood per rectum this AM.  Feels good otherwise.  Objective: Vital signs in last 24 hours: Temp:  [98 F (36.7 C)-99.1 F (37.3 C)] 98.6 F (37 C) (12/10 [id number]) Pulse Rate:  [83-108] 93 (12/10 0607) Resp:  [14-24] 16 (12/10 0607) BP: (124-147)/(60-69) 124/69 (12/10 0607) SpO2:  [95 %-100 %] 98 % (12/10 0607) Last BM Date: 08/18/16  Intake/Output from previous day: 12/09 0701 - 12/10 0700 In: 1263.3 [P.O.:120; I.V.:788.3; Blood:355] Out: 950 [Urine:950] Intake/Output this shift: No intake/output data recorded.  PE: General- In NAD Abdomen-soft, incisions are clean and intact  Lab Results:   Recent Labs  08/20/16 0511 08/20/16 1612 08/21/16 0525  WBC 14.4*  --  7.5  HGB 6.9* 7.8* 7.4*  HCT 19.9* 22.7* 21.2*  PLT 195  --  157   BMET  Recent Labs  08/20/16 0511 08/21/16 0525  NA 138 136  K 4.8 3.9  CL 107 104  CO2 24 27  GLUCOSE 121* 107*  BUN 25* 12  CREATININE 1.93* 1.11  CALCIUM 7.8* 7.9*   PT/INR No results for input(s): LABPROT, INR in the last 72 hours. Comprehensive Metabolic Panel:    Component Value Date/Time   NA 136 08/21/2016 0525   NA 138 08/20/2016 0511   K 3.9 08/21/2016 0525   K 4.8 08/20/2016 0511   CL 104 08/21/2016 0525   CL 107 08/20/2016 0511   CO2 27 08/21/2016 0525   CO2 24 08/20/2016 0511   BUN 12 08/21/2016 0525   BUN 25 (H) 08/20/2016 0511   CREATININE 1.11 08/21/2016 0525   CREATININE 1.93 (H) 08/20/2016 0511   GLUCOSE 107 (H) 08/21/2016 0525   GLUCOSE 121 (H) 08/20/2016 0511   CALCIUM  7.9 (L) 08/21/2016 0525   CALCIUM 7.8 (L) 08/20/2016 0511   AST 52 (H) 05/02/2016 1404   AST 27 04/25/2016 1459   ALT 46 05/02/2016 1404   ALT 30 04/25/2016 1459   ALKPHOS 204 (H) 05/02/2016 1404   ALKPHOS 50 04/25/2016 1459   BILITOT 0.8 05/02/2016 1404   BILITOT 1.4 (H) 04/25/2016 1459   PROT 6.9 05/02/2016 1404   PROT 7.7 04/25/2016 1459   ALBUMIN 3.4 (L) 05/02/2016 1404   ALBUMIN 4.8 04/25/2016 1459     Studies/Results: No results found.  Anti-infectives: Anti-infectives    Start     Dose/Rate Route Frequency Ordered Stop   08/18/16 1015  ertapenem (INVANZ) 1 g in sodium chloride 0.9 % 50 mL IVPB     1 g 100 mL/hr over 30 Minutes Intravenous To Short Stay 08/18/16 1002 08/18/16 1216       Trypp Heckmann J 08/21/2016

## 2016-08-22 LAB — CBC
HCT: 20.6 % — ABNORMAL LOW (ref 39.0–52.0)
HEMOGLOBIN: 7.3 g/dL — AB (ref 13.0–17.0)
MCH: 31.6 pg (ref 26.0–34.0)
MCHC: 35.4 g/dL (ref 30.0–36.0)
MCV: 89.2 fL (ref 78.0–100.0)
Platelets: 197 10*3/uL (ref 150–400)
RBC: 2.31 MIL/uL — AB (ref 4.22–5.81)
RDW: 14.5 % (ref 11.5–15.5)
WBC: 6.4 10*3/uL (ref 4.0–10.5)

## 2016-08-22 LAB — TYPE AND SCREEN
ABO/RH(D): B POS
Antibody Screen: NEGATIVE
Unit division: 0

## 2016-08-22 LAB — HEMOGLOBIN AND HEMATOCRIT, BLOOD
HEMATOCRIT: 21.2 % — AB (ref 39.0–52.0)
HEMOGLOBIN: 7.4 g/dL — AB (ref 13.0–17.0)

## 2016-08-22 MED ORDER — FENTANYL CITRATE (PF) 100 MCG/2ML IJ SOLN
25.0000 ug | INTRAMUSCULAR | Status: DC | PRN
  Administered 2016-08-22 – 2016-08-23 (×2): 25 ug via INTRAVENOUS
  Filled 2016-08-22 (×2): qty 2

## 2016-08-22 MED ORDER — HYDROCODONE-ACETAMINOPHEN 5-325 MG PO TABS
1.0000 | ORAL_TABLET | ORAL | Status: DC | PRN
  Administered 2016-08-22: 1 via ORAL
  Administered 2016-08-22 – 2016-08-23 (×4): 2 via ORAL
  Filled 2016-08-22 (×5): qty 2
  Filled 2016-08-22: qty 1

## 2016-08-22 MED ORDER — PANTOPRAZOLE SODIUM 40 MG PO TBEC
40.0000 mg | DELAYED_RELEASE_TABLET | Freq: Every day | ORAL | Status: DC
  Administered 2016-08-22: 40 mg via ORAL
  Filled 2016-08-22: qty 1

## 2016-08-22 NOTE — Progress Notes (Signed)
Left message with Dr. Gala Lewandowsky phone triage RN, concerning maroon colored stools, pt denies pain. Pt had a total of three loose maroon colored stools. SRP, RN

## 2016-08-22 NOTE — Care Management Important Message (Signed)
Important Message  Patient Details  Name: Jamie Balliett MRN: [id number] Date of Birth: August 22, 1951   Medicare Important Message Given:  Yes    Camillo Flaming 08/22/2016, 2:38 [place] Message  Patient Details  Name: Marcus Bouder MRN: [id number] Date of Birth: 10-Jan-1951   Medicare Important Message Given:  Yes    Camillo Flaming 08/22/2016, 2:38 PM

## 2016-08-22 NOTE — Progress Notes (Signed)
Hg drawn just before 6 pm showed Hg stable at 7.4 Vitals have been stable.  Will not transfuse at present.  Kaylyn Lim, MD

## 2016-08-22 NOTE — Progress Notes (Signed)
General Surgery [facility]  Assessment & Plan:  Status post right colectomy and colostomy closure POD#4             Beginning regular diet today             OOB to chair, ambulating Acute blood loss anemia             Continued bleeding per rectum - maroon colored  Hgb 7.3-7.6 past 24 hours  Will ask GI (Dr. Watt Climes) to stop by and give input Vasovagal response, possible "seizure" activity             No further episodes - discontinue telemetry  Transfer to [facility] [address] Adenocarcinoma of colon             Await path results later today (?)             Dr. Julieanne Manson aware of patient        Earnstine Regal, MD, [facility]       Office: [phone]    Subjective: Patient in bed without complaints, taking regular diet.  Wife at bedside.  Another maroon loose BM this AM.  Objective: Vital signs in last 24 hours: Temp:  [98 F (36.7 C)-99 F (37.2 C)] 99 F (37.2 C) (12/11 0558) Pulse Rate:  [91-94] 93 (12/11 0558) Resp:  [16-18] 16 (12/11 0558) BP: (124-144)/(74-85) 132/74 (12/11 0558) SpO2:  [94 %-98 %] 96 % (12/11 0558) Last BM Date: 08/18/16  Intake/Output from previous day: 12/10 0701 - 12/11 0700 In: 2880 [P.O.:1680; I.V.:1200] Out: 1250 [Urine:1250] Intake/Output this shift: No intake/output data recorded.  Physical Exam: HEENT - sclerae clear, mucous membranes moist Neck - soft Chest - clear bilaterally Cor - RRR Abdomen - soft, wounds dry and intact, active BS present Ext - no edema, non-tender Neuro - alert & oriented, no focal deficits  Lab Results:   Recent Labs  08/21/16 0525 08/21/16 1325 08/22/16 0436  WBC 7.5  --  6.4  HGB 7.4* 7.6* 7.3*  HCT 21.2* 21.8* 20.6*  PLT 157  --  197   BMET  Recent Labs  08/20/16 0511 08/21/16 0525  NA 138 136  K 4.8 3.9  CL 107 104  CO2 24 27  GLUCOSE 121* 107*  BUN 25* 12  CREATININE 1.93* 1.11  CALCIUM 7.8* 7.9*   PT/INR No results for input(s):  LABPROT, INR in the last 72 hours. Comprehensive Metabolic Panel:    Component Value Date/Time   NA 136 08/21/2016 0525   NA 138 08/20/2016 0511   K 3.9 08/21/2016 0525   K 4.8 08/20/2016 0511   CL 104 08/21/2016 0525   CL 107 08/20/2016 0511   CO2 27 08/21/2016 0525   CO2 24 08/20/2016 0511   BUN 12 08/21/2016 0525   BUN 25 (H) 08/20/2016 0511   CREATININE 1.11 08/21/2016 0525   CREATININE 1.93 (H) 08/20/2016 0511   GLUCOSE 107 (H) 08/21/2016 0525   GLUCOSE 121 (H) 08/20/2016 0511   CALCIUM 7.9 (L) 08/21/2016 0525   CALCIUM 7.8 (L) 08/20/2016 0511   AST 52 (H) 05/02/2016 1404   AST 27 04/25/2016 1459   ALT 46 05/02/2016 1404   ALT 30 04/25/2016 1459   ALKPHOS 204 (H) 05/02/2016 1404   ALKPHOS 50 04/25/2016 1459   BILITOT 0.8 05/02/2016 1404   BILITOT 1.4 (H) 04/25/2016 1459   PROT 6.9 05/02/2016 1404  PROT 7.7 04/25/2016 1459   ALBUMIN 3.4 (L) 05/02/2016 1404   ALBUMIN 4.8 04/25/2016 1459    Studies/Results: No results found.    [place] M 08/22/2016  Patient ID: [name] Brown, male   DOB: 06/16/1951, 65 y.o.   MRN: [id number]

## 2016-08-23 LAB — CBC
HCT: 22.6 % — ABNORMAL LOW (ref 39.0–52.0)
HEMOGLOBIN: 7.6 g/dL — AB (ref 13.0–17.0)
MCH: 31.4 pg (ref 26.0–34.0)
MCHC: 33.6 g/dL (ref 30.0–36.0)
MCV: 93.4 fL (ref 78.0–100.0)
Platelets: 230 10*3/uL (ref 150–400)
RBC: 2.42 MIL/uL — AB (ref 4.22–5.81)
RDW: 14.6 % (ref 11.5–15.5)
WBC: 5.1 10*3/uL (ref 4.0–10.5)

## 2016-08-23 MED ORDER — HYDROCODONE-ACETAMINOPHEN 5-325 MG PO TABS: 1.0000 | ORAL_TABLET | ORAL | 0 refills | Status: DC | PRN

## 2016-08-23 NOTE — Progress Notes (Signed)
[name] Brown 8:44 AM  Subjective: Patient doing well postop and his case discussed with Dr. Harlow Asa and  his bowel movement this morning was brown and did not have blood and he is walking the halls and eating well and his op note and pathology was reviewed but I did not discussed with the patient and he has no new complaints Objective: Vital signs stable afebrile no acute distress abdomen is soft nontender occasional bowel sounds hemoglobin stable  Assessment: Postop bleeding questionable anastomosis versus friable rectum from previous diversion currently stable  Plan: Will be available if flexible sigmoidoscopy or colonoscopy is needed and happy to see back as an outpatient if he is discharged soon and if not done beforehand repeat colonoscopy next fall and will await tumor board's decision regarding chemotherapy [facility]  Pager [phone] After 5PM or if no answer call [phone]

## 2016-08-23 NOTE — Discharge Summary (Signed)
Physician Discharge Summary [facility]  Patient ID: [name] Brown MRN: [id number] DOB/AGE: Nov 23, 1950 65 y.o.  Admit date: 08/18/2016 Discharge date: 08/23/2016  Admission Diagnoses:  Adenocarcinoma of colon, presence of colostomy  Discharge Diagnoses:  Principal Problem:   Perforated sigmoid colon s/p colectomy/colostomy 04/26/2016 Active Problems:   Cancer of right colon [facility])   Discharged Condition: good  Hospital Course: patient admitted after right colectomy and colostomy closure.  Post op course complicated by GI bleeding, presumably from staple line at anastomosis.  Ileus resolved and diet advanced.  Patient seen by GI ([place]) and Med Onc Benay Spice) during stay.  Bleeding resolved spontaneously and Hgb stable for over 24 hours.  Prepared for discharge home on POD#5.  Consults: GI  Treatments: surgery: right colectomy and colostomy closure  Discharge Exam: Blood pressure (!) 143/74, pulse 73, temperature 98 F (36.7 C), temperature source Oral, resp. rate 18, height 5\' 7"  (1.702 m), weight 76.7 kg (169 lb), SpO2 98 %. HEENT - clear Neck - soft Abd - soft, wounds dry and intact; active BS present  Disposition: Home  Discharge Instructions    Diet - low sodium heart healthy    Complete by:  As directed    Discharge instructions    Complete by:  As directed    [facility]  OPEN ABDOMINAL SURGERY: POST OP INSTRUCTIONS  Always review your discharge instruction sheet given to you by the facility where your surgery was performed.  A prescription for pain medication may be given to you upon discharge.  Take your pain medication as prescribed.  If narcotic pain medicine is not needed, then you may take acetaminophen (Tylenol) or ibuprofen (Advil) as needed. Take your usually prescribed medications unless otherwise directed. If you need a refill on your pain medication, please contact your pharmacy. They will contact our office to request  authorization.  Prescriptions will not be filled after 5 pm or on weekends. You should follow a light diet the first few days after arrival home, such as soup and crackers, unless your doctor has advised otherwise. A high-fiber, low fat diet can be resumed as tolerated.  Be sure to include plenty of fluids daily.  Most patients will experience some swelling and bruising in the area of the incision. Ice packs will help. Swelling and bruising can take several days to resolve. It is common to experience some constipation if taking pain medication after surgery.  Increasing fluid intake and taking a stool softener will usually help or prevent this problem from occurring.  A mild laxative (Milk of Magnesia or Miralax) should be taken according to package directions if there are no bowel movements after 48 hours.  You may have steri-strips (small skin tapes) in place directly over the incision.  These strips should be left on the skin for 5-7 days.  Any sutures or staples will be removed at the office during your follow-up visit. You may find that a light gauze bandage over your incision may keep your staples from being rubbed or pulled. You may shower and replace the bandage daily. ACTIVITIES:  You may resume regular (light) daily activities beginning the next day - such as daily self-care, walking, climbing stairs - gradually increasing activities as tolerated.  You may have sexual intercourse when it is comfortable.  Refrain from any heavy lifting or straining until approved by your doctor.  You may drive when you no longer are taking prescription pain medication, you can comfortably wear a seatbelt, and  you can safely maneuver your car and apply brakes. You should see your doctor in the office for a follow-up appointment approximately 2-3 weeks after your surgery.  Make sure that you call for this appointment within a day or two after you arrive home to insure a convenient appointment time.  WHEN TO CALL YOUR  DOCTOR: Fever greater than 101.0 Inability to urinate Persistent nausea and/or vomiting Extreme swelling or bruising Continued bleeding from incision Increased pain, redness, or drainage from the incision Difficulty swallowing or breathing Muscle cramping or spasms Numbness or tingling in hands or around lips  IF YOU HAVE DISABILITY OR FAMILY LEAVE FORMS, YOU MUST BRING THEM TO THE OFFICE FOR PROCESSING.  PLEASE DO NOT GIVE THEM TO YOUR DOCTOR.  The clinic staff is available to answer your questions during regular business hours.  Please don't hesitate to call and ask to speak to one of the nurses if you have concerns.  [facility] [place] Office: [phone]  For further questions, please visit [email or web address]   Increase activity slowly    Complete by:  As directed    No dressing needed    Complete by:  As directed        Medication List    STOP taking these medications   docusate sodium 100 MG capsule Commonly known as:  COLACE   ibuprofen 200 MG tablet Commonly known as:  ADVIL,MOTRIN   oxyCODONE 5 MG immediate release tablet Commonly known as:  Oxy IR/ROXICODONE   polyethylene glycol packet Commonly known as:  MIRALAX / GLYCOLAX   psyllium 95 % Pack Commonly known as:  HYDROCIL/METAMUCIL     TAKE these medications   famotidine 20 MG tablet Commonly known as:  PEPCID Take 20 mg by mouth daily as needed for heartburn.   HYDROcodone-acetaminophen 5-325 MG tablet Commonly known as:  NORCO/VICODIN Take 1-2 tablets by mouth every 4 (four) hours as needed for moderate pain.      Follow-up Information    Jesica Goheen M, MD. Schedule an appointment as soon as possible for a visit in 1 week(s).   Specialty:  General Surgery Why:  For staple removal Contact information: [address] [place] [address] [phone]           Earnstine Regal, MD, [facility] Office:  [phone]   Signed: Earnstine Regal 08/23/2016, 9:06 AM

## 2016-08-23 NOTE — Progress Notes (Signed)
All DC instructions reviewed, all questions and concerns addressed. Pt in NAD, VSS, pain is controlled, tolerating diet, LBM 12/11. DC home with wife.

## 2016-08-24 ENCOUNTER — Telehealth: Payer: Self-pay | Admitting: *Deleted

## 2016-08-24 NOTE — Telephone Encounter (Signed)
Spoke with patient and provided new patient appointment for 09/19/16 at 1:45/2:00 with Dr. Julieanne Manson. Informed of location of [place], valet service, and registration process. Reminded to bring photo ID,  insurance cards and a current medication list, including supplements. Patient verbalizes understanding. Notified HIM.

## 2016-08-24 NOTE — Telephone Encounter (Signed)
Oncology Nurse Navigator Documentation  Oncology Nurse Navigator Flowsheets 08/24/2016  Navigator Location [facility]  Referral date to RadOnc/MedOnc 08/19/2016  Navigator Encounter Type Introductory phone call  Abnormal Finding Date 07/25/2016  Confirmed Diagnosis Date 07/25/2016  Surgery Date 08/18/2016  Unable to reach patient at home #: would require entry of two digits to prove not robo call and then sounds like fax machine ring. Left VM on mobile # for his wife Tamera Reason to have him call for appointment with Dr. Benay Spice in January. Left direct # of nurse navigator to return call.

## 2016-09-20 ENCOUNTER — Ambulatory Visit (HOSPITAL_BASED_OUTPATIENT_CLINIC_OR_DEPARTMENT_OTHER): Payer: Medicare Other | Admitting: Oncology

## 2016-09-20 VITALS — BP 161/87 | HR 87 | Temp 98.5°F | Resp 16 | Wt 161.7 lb

## 2016-09-20 DIAGNOSIS — C182 Malignant neoplasm of ascending colon: Secondary | ICD-10-CM

## 2016-09-20 DIAGNOSIS — D6489 Other specified anemias: Secondary | ICD-10-CM | POA: Diagnosis not present

## 2016-09-20 DIAGNOSIS — C180 Malignant neoplasm of cecum: Secondary | ICD-10-CM

## 2016-09-20 NOTE — Progress Notes (Signed)
[place] Patient Consult   Referring MD: Draco Malczewski 66 y.o.  24-Mar-1951    Reason for Referral: Colon cancer   HPI: Mr. Hammar developed acute abdominal pain in August 2017. He presents emergency room on 04/25/2016. No focal hepatic lesions. Free air was noted. Wall thickening of the terminal ileum at the ileocecal valve. Inflammatory changes surrounding the ascending colon. No discrete mass lesion. No adenopathy. No free fluid. Dr. Harlow Asa was consulted and he was taken to the operating room 04/26/2016. There was peritonitis with a large stool burden throughout the colon. A perforation was identified at the rectosigmoid colon measuring 8 cm in length. A sigmoid colectomy and descending colostomy were performed. The pathology from the colon resection revealed perforation with serositis. No malignancy.  He was referred to Dr.Magod for a colonoscopy prior to takedown of the descending colostomy. The colonoscopy on 07/25/2016 revealed a polyp of the distal sigmoid colon and a polyp. A small mass was found in the cecum. The mass measured 1 cm. No bleeding was present. Biopsies were taken. in the distal transverse colon. The pathology from the equal gastroenterology lab revealed invasive adenocarcinoma at the cecum. The sigmoid polyp returned as a tubular adenoma.  He was taken to the operating room by Dr. Harlow Asa on 08/18/2016 for a right colectomy and colostomy closure. A 2 cm mass was noted at the cecum. The pathology ([id number]) revealed invasive moderately differentiated adenocarcinoma of the cecum. Tumor invaded into but not through the muscular propria. Resection margins were negative. 18 lymph nodes were negative. No loss of mismatch repair protein expression. The tumor returned MSI-stable. He developed postoperative anemia and was transfused with packed red blood cells on 08/20/2016.     Past Medical History:  Diagnosis Date  . Colon cancer  (HCC)-Cecum, stage I (T2 N0)  08/18/2016   . Family history of adverse reaction to anesthesia    pt states his mother had 2 episodes of hyponatremia following anesthesia   . GERD (gastroesophageal reflux disease)   . Hypertension    pt is currently not taking any medications; pt states is borderline  . Melanoma (HCC)-Right back    . Perforated sigmoid colon [facility]) August 2017   . Tinnitus   . Wears glasses     Past Surgical History:  Procedure Laterality Date  . COLOSTOMY CLOSURE N/A 08/18/2016   Procedure: COLOSTOMY CLOSURE OPEN PROCEDURE;  Surgeon: Armandina Gemma, MD;  Location: [facility];  Service: General;  Laterality: N/A;  . LAPAROTOMY N/A 04/25/2016   Procedure: EXPLORATORY LAPAROTOMY WITH SIGMOID COLECTOMY, COLOSTOMY;  Surgeon: Armandina Gemma, MD;  Location: [facility];  Service: General;  Laterality: N/A;  . MELANOMA EXCISION    . PARTIAL COLECTOMY Right 08/18/2016   Procedure: RIGHT COLECTOMY;  Surgeon: Armandina Gemma, MD;  Location: [facility];  Service: General;  Laterality: Right;    Medications: Reviewed  Allergies: No Known Allergies  Family history: His mother was diagnosed with breast cancer in her 49s. A sister was diagnosed with breast cancer in her 74s. No other family history of cancer.  Social History:   He lives in [place]. He is retired Software engineer. He does not use cigarettes. He drinks approximately 3 glasses of wine per night. No risk factor for HIV or hepatitis.  History  Alcohol Use  . Yes    Comment: 2-3 glasses of wine daily     History  Smoking Status  . Never Smoker  Smokeless Tobacco  . Never  Used      ROS:   Positives include:10 pound weight loss following surgery, constipation prior to surgery, "skin inflammation "over the abdomen and legs in the winter  A complete ROS was otherwise negative.  Physical Exam:  Blood pressure (!) 161/87, pulse 87, temperature 98.5 F (36.9 C), temperature source Oral, resp. rate 16, weight 161 lb 11.2 oz (73.3 kg),  SpO2 100 %.  HEENT: Oropharynx without visible mass, neck without mass Lungs: Clear bilaterally Cardiac: Regular rate and rhythm Abdomen: No hepatosplenomegaly, no mass, nontender, healed surgical incision GU: Testes without mass  Vascular: No leg edema Lymph nodes: No cervical, supraclavicular, axillary, or inguinal nodes Neurologic: Alert and oriented, the motor exam appears intact in the upper and lower extremities Skin: No rash Musculoskeletal: No spine tenderness   LAB:  CBC  Lab Results  Component Value Date   WBC 5.1 08/23/2016   HGB 7.6 (L) 08/23/2016   HCT 22.6 (L) 08/23/2016   MCV 93.4 08/23/2016   PLT 230 08/23/2016     CMP      Component Value Date/Time   NA 136 08/21/2016 0525   K 3.9 08/21/2016 0525   CL 104 08/21/2016 0525   CO2 27 08/21/2016 0525   GLUCOSE 107 (H) 08/21/2016 0525   BUN 12 08/21/2016 0525   CREATININE 1.11 08/21/2016 0525   CALCIUM 7.9 (L) 08/21/2016 0525   PROT 6.9 05/02/2016 1404   ALBUMIN 3.4 (L) 05/02/2016 1404   AST 52 (H) 05/02/2016 1404   ALT 46 05/02/2016 1404   ALKPHOS 204 (H) 05/02/2016 1404   BILITOT 0.8 05/02/2016 1404   GFRNONAA >60 08/21/2016 0525   GFRAA >60 08/21/2016 0525     Imaging: As per history of present illness, chest x-ray 08/16/2016-no active lung disease   Assessment/Plan:   1. Colon cancer-cecum, stage I (T2 N0)  MSI-stable, no loss of mismatch repair protein expression  Status post a right colectomy 08/18/2016  2.   Postoperative anemia  3.   Sigmoid perforation August 2017, status post a sigmoid colectomy 04/25/2016  4.    Multiple polyps noted on colonoscopy 07/25/2016   Disposition:   Mr. Biven has been diagnosed with stage I colon cancer. I discussed the prognosis and reviewed the details of the surgical pathology report with him. He has an excellent prognosis for a long-term disease-free survival. There is no indication for adjuvant systemic therapy. We discussed diet and exercise  maneuvers that may decrease the chance of developing colon cancer.  He should have a one-year follow-up colonoscopy with Dr.Magod.  He is scheduled to see Dr. Birdie Riddle later this week to establish as a primary care patient. I will recommend she check a follow-up CBC and also a CEA. I recommend a CEA be checked every 6 months for 3 years and then yearly up to 5 years from surgery.  Mr. Cadavid will alert family members of his diagnosis to be sure they receive appropriate screening for colon cancer.  He is not scheduled for a follow-up appointment at the [facility]. I am available to see him in the future as needed.  Approximately 50 minutes were spent with the patient today. The majority of the time was used for counseling and coordination of care.   Betsy Coder, MD  09/20/2016, 2:28 PM

## 2016-09-23 ENCOUNTER — Ambulatory Visit (INDEPENDENT_AMBULATORY_CARE_PROVIDER_SITE_OTHER): Payer: Medicare Other | Admitting: Family Medicine

## 2016-09-23 ENCOUNTER — Encounter: Payer: Self-pay | Admitting: Family Medicine

## 2016-09-23 VITALS — BP 123/85 | HR 86 | Temp 98.8°F | Resp 16 | Ht 67.0 in | Wt 159.4 lb

## 2016-09-23 DIAGNOSIS — Z23 Encounter for immunization: Secondary | ICD-10-CM | POA: Diagnosis not present

## 2016-09-23 DIAGNOSIS — D62 Acute posthemorrhagic anemia: Secondary | ICD-10-CM | POA: Insufficient documentation

## 2016-09-23 DIAGNOSIS — R011 Cardiac murmur, unspecified: Secondary | ICD-10-CM | POA: Diagnosis not present

## 2016-09-23 DIAGNOSIS — I2510 Atherosclerotic heart disease of native coronary artery without angina pectoris: Secondary | ICD-10-CM | POA: Diagnosis not present

## 2016-09-23 DIAGNOSIS — C182 Malignant neoplasm of ascending colon: Secondary | ICD-10-CM | POA: Diagnosis not present

## 2016-09-23 LAB — BASIC METABOLIC PANEL
BUN: 17 mg/dL (ref 6–23)
CHLORIDE: 103 meq/L (ref 96–112)
CO2: 28 meq/L (ref 19–32)
Calcium: 9.8 mg/dL (ref 8.4–10.5)
Creatinine, Ser: 0.99 mg/dL (ref 0.40–1.50)
GFR: 80.37 mL/min (ref >60.00)
GLUCOSE: 95 mg/dL (ref 70–99)
POTASSIUM: 4.7 meq/L (ref 3.5–5.1)
Sodium: 139 mEq/L (ref 135–145)

## 2016-09-23 LAB — LIPID PANEL
CHOL/HDL RATIO: 3
CHOLESTEROL: 197 mg/dL (ref 0–200)
HDL: 59.1 mg/dL (ref >39.00)
LDL Cholesterol: 113 mg/dL — ABNORMAL HIGH (ref 0–99)
NonHDL: 137.63
TRIGLYCERIDES: 121 mg/dL (ref 0.0–149.0)
VLDL: 24.2 mg/dL (ref 0.0–40.0)

## 2016-09-23 LAB — HEPATIC FUNCTION PANEL
ALT: 25 U/L (ref 0–53)
AST: 20 U/L (ref 0–37)
Albumin: 4.4 g/dL (ref 3.5–5.2)
Alkaline Phosphatase: 66 U/L (ref 39–117)
BILIRUBIN TOTAL: 0.6 mg/dL (ref 0.2–1.2)
Bilirubin, Direct: 0.1 mg/dL (ref 0.0–0.3)
Total Protein: 6.4 g/dL (ref 6.0–8.3)

## 2016-09-23 LAB — CBC WITH DIFFERENTIAL/PLATELET
BASOS PCT: 0.7 % (ref 0.0–3.0)
Basophils Absolute: 0 10*3/uL (ref 0.0–0.1)
EOS ABS: 0.1 10*3/uL (ref 0.0–0.7)
Eosinophils Relative: 1.9 % (ref 0.0–5.0)
HCT: 37.9 % — ABNORMAL LOW (ref 39.0–52.0)
HEMOGLOBIN: 12.9 g/dL — AB (ref 13.0–17.0)
Lymphocytes Relative: 23.4 % (ref 12.0–46.0)
Lymphs Abs: 1 10*3/uL (ref 0.7–4.0)
MCHC: 34 g/dL (ref 30.0–36.0)
MCV: 92 fl (ref 78.0–100.0)
MONO ABS: 0.4 10*3/uL (ref 0.1–1.0)
Monocytes Relative: 10.2 % (ref 3.0–12.0)
Neutro Abs: 2.6 10*3/uL (ref 1.4–7.7)
Neutrophils Relative %: 63.8 % (ref 43.0–77.0)
Platelets: 189 10*3/uL (ref 150.0–400.0)
RBC: 4.13 Mil/uL — ABNORMAL LOW (ref 4.22–5.81)
RDW: 14.9 % (ref 11.5–15.5)
WBC: 4.1 10*3/uL (ref 4.0–10.5)

## 2016-09-23 NOTE — Progress Notes (Signed)
Pre visit review using our clinic review tool, if applicable. No additional management support is needed unless otherwise documented below in the visit note. 

## 2016-09-23 NOTE — Assessment & Plan Note (Signed)
New dx for pt.  S/p R sided colectomy.  He saw Dr Ammie Dalton who indicated no further tx was needed.  He recommended checking CEA q6 months x3 years and then yearly during year 4 and 5.  He is due for repeat colonoscopy Dec 2018.  Pt expressed understanding and is in agreement w/ plan.

## 2016-09-23 NOTE — Assessment & Plan Note (Signed)
New.  Unclear if this is a flow murmur in the setting of anemia or underlying cardiac issue.  Get ECHO to assess.

## 2016-09-23 NOTE — Patient Instructions (Signed)
Schedule your complete physical in 6 months We'll notify you of your lab results and make any changes if needed We'll call you with your ECHO appt to assess the murmur Please call and schedule an appt w/ [facility] at [phone] to discuss ADHD and other concerns Call with any questions or concerns Welcome!  We're glad to have you!!!

## 2016-09-23 NOTE — Progress Notes (Signed)
   Subjective:    Patient ID: [name] Brown, male    DOB: 04-Dec-1950, 66 y.o.   MRN: [id number]  HPI New to establish.  Previous MD- none recently  GI- Magod  Colon cancer- pt underwent colectomy 08/18/16 for stage I colon cancer.  He saw Dr Ammie Dalton who did not recommend adjuvant tx and recommended CEA and Hgb levels today and then CEA levels every 6 months x3 years and then yearly until 2022.  Colonoscopy due Dec 2018.  Pt's Hgb was up to 7.6 after transfusion at time of d/c.  Due for repeat Hgb.  Pt reports fatigue is improving but he is not overdoing it.    CAD- pt had coronary calcification on CXR 08/16/16.  Pt has strong family hx of CAD.  BP is well controlled but no recent lipid panel.  Not currently on ASA due to recent surgery.  Denies CP, SOB, HAs, visual changes.  'i'm a high functioning asbergers case'- pt would like referral for additional testing.  Hx of difficulty focusing- no hyperactivity.  'zoning out'.     Review of Systems For ROS see HPI     Objective:   Physical Exam  Constitutional: He is oriented to person, place, and time. He appears well-developed and well-nourished. No distress.  HENT:  Head: Normocephalic and atraumatic.  Eyes: Conjunctivae and EOM are normal. Pupils are equal, round, and reactive to light.  Neck: Normal range of motion. Neck supple. No thyromegaly present.  Cardiovascular: Normal rate, regular rhythm and intact distal pulses.   Murmur (II/VI SEM murmur heard best at RUSB) heard. Pulmonary/Chest: Effort normal and breath sounds normal. No respiratory distress.  Abdominal: Soft. Bowel sounds are normal. He exhibits no distension.  Musculoskeletal: He exhibits no edema.  Lymphadenopathy:    He has no cervical adenopathy.  Neurological: He is alert and oriented to person, place, and time. No cranial nerve deficit.  Skin: Skin is warm and dry.  Psychiatric: He has a normal mood and affect. His behavior is normal.  Vitals reviewed.           Assessment & Plan:

## 2016-09-23 NOTE — Assessment & Plan Note (Signed)
New.  Pt received a transfusion after recent colectomy but at d/c Hgb was still 7.6  Repeat CBC today and continue iron supplement daily.  Pt expressed understanding and is in agreement w/ plan.

## 2016-09-23 NOTE — Assessment & Plan Note (Signed)
New.  Noted on recent CXR.  Pt's BP is well controlled.  Will check cholesterol to determine if statin is needed.  Plan is for pt to start ASA once we are sure that he is healed from recent colectomy.  Pt expressed understanding and is in agreement w/ plan.

## 2016-09-24 LAB — CEA: CEA: <0.5 ng/mL

## 2016-10-12 ENCOUNTER — Other Ambulatory Visit: Payer: Self-pay

## 2016-10-12 ENCOUNTER — Ambulatory Visit (HOSPITAL_COMMUNITY): Payer: Medicare Other | Attending: Cardiovascular Disease

## 2016-10-12 ENCOUNTER — Other Ambulatory Visit: Payer: Self-pay | Admitting: Family Medicine

## 2016-10-12 DIAGNOSIS — R011 Cardiac murmur, unspecified: Secondary | ICD-10-CM

## 2016-10-12 DIAGNOSIS — I501 Left ventricular failure: Secondary | ICD-10-CM | POA: Diagnosis not present

## 2016-10-12 DIAGNOSIS — I358 Other nonrheumatic aortic valve disorders: Secondary | ICD-10-CM | POA: Insufficient documentation

## 2016-10-12 DIAGNOSIS — I5020 Unspecified systolic (congestive) heart failure: Secondary | ICD-10-CM

## 2016-10-24 NOTE — Progress Notes (Signed)
Cardiology Office Note   Date:  10/25/2016   ID:  [name] Brown, DOB 10-21-1950, MRN [id number]  PCP:  [name], MD  Cardiologist:   Skeet Latch, MD   Chief Complaint  Patient presents with  . New Patient (Initial Visit)    Pt states no Sx.       History of Present Illness: [name] Brown is a 66 y.o. 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  [name] Brown saw Dr. [name] on 09/23/16 and was noted to have coronary atherosclerosis on chest xray.  He had an echo 10/12/16 that revealed LVEF 30-35% with grade 1 diastolic dysfunction.  His aortic valve was heavily calcified and likely bicuspid.  Although the mean gradient was 14 mmHg, it was felt that he likely had moderate aortic stenosis given his reduced systolic function and leaflet restriction.   He was started on aspirin and referred to cardiology for evaluation.  [name] Brown has been feeling well.   he denies chest pain or shortness of breath. He has had mild lower extremity edema that is been unchanged for years. He denies orthopnea or PND. He walks 3 miles briskly 3-4 times per week and has no symptoms. He does report daily palpitations that last for about 1 minute. There is no associated lightheadedness, dizziness, or shortness of breath. His wife notes that he does snore but he does not have apneic episodes.   [name] Brown was treated for colon cancer with surgery only and did not require chemotherapy. His surgery was complicated by postoperative anemia and he did require transfusion. Since then he has not noted any melena or hematochezia.   His hemoglobin was most recently 12.9 on 09/23/16.  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    Past Medical History:  Diagnosis Date  . Colon cancer ([place])   . Family history of adverse  reaction to anesthesia    pt states his mother had 2 episodes of hyponatremia following anesthesia   . GERD (gastroesophageal reflux disease)   . Hyperlipidemia 10/25/2016  . Hypertension    pt is currently not taking any medications; pt states is borderline  . Melanoma ([place])   . Mild aortic stenosis 10/25/2016  . Perforated sigmoid colon ([place])   . PVC's (premature ventricular contractions) 10/25/2016  . Tinnitus   . Wears glasses     Past Surgical History:  Procedure Laterality Date  . COLOSTOMY CLOSURE N/A 08/18/2016   Procedure: COLOSTOMY CLOSURE OPEN PROCEDURE;  Surgeon: Armandina Gemma, MD;  Location: [facility];  Service: General;  Laterality: N/A;  . LAPAROTOMY N/A 04/25/2016   Procedure: EXPLORATORY LAPAROTOMY WITH SIGMOID COLECTOMY, COLOSTOMY;  Surgeon: Armandina Gemma, MD;  Location: [facility];  Service: General;  Laterality: N/A;  . MELANOMA EXCISION    . PARTIAL COLECTOMY Right 08/18/2016   Procedure: RIGHT COLECTOMY;  Surgeon: Armandina Gemma, MD;  Location: [facility];  Service: General;  Laterality: Right;     Current Outpatient Prescriptions  Medication Sig Dispense Refill  . aspirin EC 81 MG tablet Take 81 mg by mouth daily.    . famotidine (PEPCID) 20 MG tablet Take 20 mg by mouth daily as needed for heartburn. Takes at [facility]    . Melatonin 5 MG TABS Take by mouth. Takes at [facility]    . metoprolol tartrate (LOPRESSOR)  25 MG tablet 1/2 TABLET BY MOUTH TWICE A DAY 90 tablet 1  . valsartan (DIOVAN) 40 MG tablet Take 1 tablet (40 mg total) by mouth daily. 90 tablet 1   No current facility-administered medications for this visit.     Allergies:   Patient has no known allergies.    Social History:  The patient  reports that he has never smoked. He has never used smokeless tobacco. He reports that he drinks alcohol. He reports that he does not use drugs.   Family History:  The patient's family history includes Alcohol abuse in his father; CAD in his mother; Cirrhosis in his father; Dementia in his  mother; Heart attack in his father; Kidney disease in his mother; Stroke in his maternal grandmother and paternal grandmother; Vascular Disease in his mother.    ROS:  Please see the history of present illness.   Otherwise, review of systems are positive for none.   All other systems are reviewed and negative.    PHYSICAL EXAM: VS:  BP (!) 160/85   Pulse 80   Ht 5\' 7"  (1.702 m)   Wt 72.9 kg (160 lb 12.8 oz)   BMI 25.18 kg/m  , BMI Body mass index is 25.18 kg/m. GENERAL:  Well appearing HEENT:  Pupils equal round and reactive, fundi not visualized, oral mucosa unremarkable NECK:  No jugular venous distention, waveform within normal limits, carotid upstroke brisk and symmetric, no bruits, no thyromegaly LYMPHATICS:  No cervical adenopathy LUNGS:  Clear to auscultation bilaterally HEART:  Regularly irregular.   PMI not displaced or sustained,S1 and S2 within normal limits, no S3, no S4, no clicks, no rubs, III/VI mid-peaking systolic murmurs at the LUSB with radiation to the R carotid.   ABD:  Flat, positive bowel sounds normal in frequency in pitch, no bruits, no rebound, no guarding, no midline pulsatile mass, no hepatomegaly, no splenomegaly EXT:  2 plus pulses throughout, no edema, no cyanosis no clubbing SKIN:  No rashes no nodules NEURO:  Cranial nerves II through XII grossly intact, motor grossly intact throughout PSYCH:  Cognitively intact, oriented to person place and time   EKG:  EKG is ordered today. The ekg ordered today demonstrates sinus rhythm.  Ventricular bigeminy.    Recent Labs: 08/19/2016: Magnesium 1.7 09/23/2016: ALT 25; BUN 17; Creatinine, Ser 0.99; Hemoglobin 12.9; Platelets 189.0; Potassium 4.7; Sodium 139    Lipid Panel    Component Value Date/Time   CHOL 197 09/23/2016 1435   TRIG 121.0 09/23/2016 1435   HDL 59.10 09/23/2016 1435   CHOLHDL 3 09/23/2016 1435   VLDL 24.2 09/23/2016 1435   LDLCALC 113 (H) 09/23/2016 1435      Wt Readings from Last 3  Encounters:  10/25/16 72.9 kg (160 lb 12.8 oz)  09/23/16 72.3 kg (159 lb 6 oz)  09/20/16 73.3 kg (161 lb 11.2 oz)      ASSESSMENT AND PLAN:  # Chronic systolic and diastolic heart failure:  Mr. Summersett' ejection fraction was noted to be low on echocardiogram. He has no symptoms of heart failure. On my personal review of his echo it appears that this is worse in the inferolateral region. We will refer him for left heart catheterization to assess for ischemia. His left ventricle is not dilated and he does not have any significant hypertrophy making it less likely that this is due to severe aortic stenosis. In addition, on reviewing his echo it does not appear that his aortic valve is apparently stenosed. He  does not recall any preceding viral illnesses consistent with post viral cardiomyopathy. This could also be due to tachycardia mediated myopathy given that he was in ventricular bigeminy today. Start metoprolol 12.5 mg twice daily and valsartan 40 mg daily.   # Mild-moderate AS:  On review of his echo it appears that there is fusion of the right and noncoronary cusps. There is slightly restricted motion but likely mild to moderate aortic stenosis. Mean gradient was 14 mmHg. This may be artificially low due to his reduced systolic function. Continue to monitor for now.   # Hypertension:  Blood pressure poorly-controlled. Adding valsartan and metoprolol as above.   # Hyperlipidemia: We will wait until after his heart catheterization to address this.   # PVCs:  Mr. Cariker was noted to have PVCs on EKG today and reports palpitations. We will start metoprolol 12.5 mg twice daily.     Current medicines are reviewed at length with the patient today.  The patient does not have concerns regarding medicines.  The following changes have been made:  Start metoprolol and valsartan  Labs/ tests ordered today include:   Orders Placed This Encounter  Procedures  . CBC with Differential/Platelet  .  Basic metabolic panel  . INR/PT  . EKG 12-Lead     Disposition:   FU with Lamonta Cypress C. Oval Linsey, MD, [facility] in 1 month    This note was written with the assistance of speech recognition software.  Please excuse any transcriptional errors.  Signed, Safire Gordin C. Oval Linsey, MD, [facility]  10/25/2016 5:03 PM    [place]

## 2016-10-25 ENCOUNTER — Ambulatory Visit (INDEPENDENT_AMBULATORY_CARE_PROVIDER_SITE_OTHER): Payer: Medicare Other | Admitting: Cardiovascular Disease

## 2016-10-25 ENCOUNTER — Encounter: Payer: Self-pay | Admitting: Cardiovascular Disease

## 2016-10-25 VITALS — BP 160/85 | HR 80 | Ht 67.0 in | Wt 160.8 lb

## 2016-10-25 DIAGNOSIS — I493 Ventricular premature depolarization: Secondary | ICD-10-CM | POA: Diagnosis not present

## 2016-10-25 DIAGNOSIS — I5042 Chronic combined systolic (congestive) and diastolic (congestive) heart failure: Secondary | ICD-10-CM | POA: Diagnosis not present

## 2016-10-25 DIAGNOSIS — Z01812 Encounter for preprocedural laboratory examination: Secondary | ICD-10-CM

## 2016-10-25 DIAGNOSIS — I2584 Coronary atherosclerosis due to calcified coronary lesion: Secondary | ICD-10-CM

## 2016-10-25 DIAGNOSIS — E7800 Pure hypercholesterolemia, unspecified: Secondary | ICD-10-CM

## 2016-10-25 DIAGNOSIS — I2510 Atherosclerotic heart disease of native coronary artery without angina pectoris: Secondary | ICD-10-CM | POA: Diagnosis not present

## 2016-10-25 DIAGNOSIS — E785 Hyperlipidemia, unspecified: Secondary | ICD-10-CM | POA: Insufficient documentation

## 2016-10-25 DIAGNOSIS — Q230 Congenital stenosis of aortic valve: Secondary | ICD-10-CM | POA: Insufficient documentation

## 2016-10-25 DIAGNOSIS — I350 Nonrheumatic aortic (valve) stenosis: Secondary | ICD-10-CM

## 2016-10-25 DIAGNOSIS — I110 Hypertensive heart disease with heart failure: Secondary | ICD-10-CM

## 2016-10-25 DIAGNOSIS — Q231 Congenital insufficiency of aortic valve: Secondary | ICD-10-CM

## 2016-10-25 HISTORY — DX: Hyperlipidemia, unspecified: E78.5

## 2016-10-25 HISTORY — DX: Ventricular premature depolarization: I49.3

## 2016-10-25 LAB — CBC WITH DIFFERENTIAL/PLATELET
Basophils Absolute: 0 cells/uL (ref 0–200)
Basophils Relative: 0 %
Eosinophils Absolute: 44 cells/uL (ref 15–500)
Eosinophils Relative: 1 %
HEMATOCRIT: 39.8 % (ref 38.5–50.0)
HEMOGLOBIN: 13.2 g/dL (ref 13.2–17.1)
LYMPHS ABS: 1012 {cells}/uL (ref 850–3900)
Lymphocytes Relative: 23 %
MCH: 30.6 pg (ref 27.0–33.0)
MCHC: 33.2 g/dL (ref 32.0–36.0)
MCV: 92.3 fL (ref 80.0–100.0)
MONO ABS: 308 {cells}/uL (ref 200–950)
MPV: 11.3 fL (ref 7.5–12.5)
Monocytes Relative: 7 %
NEUTROS PCT: 69 %
Neutro Abs: 3036 cells/uL (ref 1500–7800)
Platelets: 198 10*3/uL (ref 140–400)
RBC: 4.31 MIL/uL (ref 4.20–5.80)
RDW: 13.9 % (ref 11.0–15.0)
WBC: 4.4 10*3/uL (ref 3.8–10.8)

## 2016-10-25 MED ORDER — VALSARTAN 40 MG PO TABS: 40.0000 mg | ORAL_TABLET | Freq: Every day | ORAL | 1 refills | Status: DC

## 2016-10-25 MED ORDER — METOPROLOL TARTRATE 25 MG PO TABS: ORAL_TABLET | ORAL | 1 refills | Status: DC

## 2016-10-25 NOTE — Patient Instructions (Addendum)
Medication Instructions:  START METOPROLOL 12.5 MG TWICE DAY (1/2 OF A 25 MG TABLET)  START ASPRIN 81 MG DAILY  START VALSARTAN 40 MG DAILY   Labwork: BMET/CBC/PT/INR AT [facility] ON THE FIRST FLOOR  Testing/Procedures: Your physician has requested that you have a cardiac catheterization. Cardiac catheterization is used to diagnose and/or treat various heart conditions. Doctors may recommend this procedure for a number of different reasons. The most common reason is to evaluate chest pain. Chest pain can be a symptom of coronary artery disease (CAD), and cardiac catheterization can show whether plaque is narrowing or blocking your heart's arteries. This procedure is also used to evaluate the valves, as well as measure the blood flow and oxygen levels in different parts of your heart. For further information please visit [email or web address]. Please follow instruction sheet, as given. 11/03/16  Follow-Up: Your physician recommends that you schedule a follow-up appointment in: 1 MONTH OV  Any Other Special Instructions Will Be Listed Below (If Applicable).  You are scheduled for a cardiac catheterization on 11/03/16 with Dr. Angelena Form or associate.  Go to [facility] 2nd Floor Short Stay on 11/03/16 at 7:00 FOR A 9:00 PROCEDURE.  Enter thru the [employer] entrance A No food or drink after midnight NIGHT BEFORE. You may take your medications with a sip of water on the day of your procedure.

## 2016-10-26 LAB — PROTIME-INR
INR: 1
PROTHROMBIN TIME: 10.5 s (ref 9.0–11.5)

## 2016-10-26 LAB — BASIC METABOLIC PANEL
BUN: 17 mg/dL (ref 7–25)
CO2: 27 mmol/L (ref 20–31)
Calcium: 9.6 mg/dL (ref 8.6–10.3)
Chloride: 103 mmol/L (ref 98–110)
Creat: 1.04 mg/dL (ref 0.70–1.25)
GLUCOSE: 90 mg/dL (ref 65–99)
POTASSIUM: 4.4 mmol/L (ref 3.5–5.3)
Sodium: 137 mmol/L (ref 135–146)

## 2016-10-31 ENCOUNTER — Telehealth: Payer: Self-pay | Admitting: Cardiovascular Disease

## 2016-10-31 ENCOUNTER — Encounter: Payer: Self-pay | Admitting: Cardiovascular Disease

## 2016-10-31 NOTE — Telephone Encounter (Signed)
Follow Up   Per pt still hadn't heard anything from nurse, and was just calling to follow up. Requesting call back

## 2016-10-31 NOTE — Telephone Encounter (Signed)
Pt c/o medication issue:  1. Name of Medication: merisoprol  2. How are you currently taking this medication (dosage and times per day)? 12.5 mg 2x day  3. Are you having a reaction (difficulty breathing--STAT)? no 4. What is your medication issue? diarrhea

## 2016-10-31 NOTE — Telephone Encounter (Signed)
Pt addressed his concerns in a telephone note, which I have forwarded to pharmD to review.

## 2016-10-31 NOTE — Telephone Encounter (Signed)
Advised pt his e-mail inquiry regarding metoprolol has been sent to pharmD and MD to review, will call and discuss advisements once I hear back. Pt expressed understanding and thanks.

## 2016-11-01 ENCOUNTER — Other Ambulatory Visit: Payer: Self-pay | Admitting: Pharmacist Clinician (PhC)/ Clinical Pharmacy Specialist

## 2016-11-01 ENCOUNTER — Telehealth: Payer: Self-pay | Admitting: Cardiovascular Disease

## 2016-11-01 MED ORDER — METOPROLOL SUCCINATE ER 25 MG PO TB24: 25.0000 mg | ORAL_TABLET | Freq: Every day | ORAL | 3 refills | Status: DC

## 2016-11-01 NOTE — Telephone Encounter (Signed)
PharmD sent response to patient on 10/31/16 at 5:40pm. Please see note.   Thanks

## 2016-11-01 NOTE — Telephone Encounter (Signed)
See patient email discussion regarding recommended med change by Erasmo Downer, pharmD  I called patient and verbalized recommendations, he's OK for new Rx to go to [name] - Rx submitted today, and patient verbalized understanding of instructions for use.  Aware to call if new concerns, plan to wait several days to a week to follow for improvement of symptoms.

## 2016-11-01 NOTE — Telephone Encounter (Signed)
Follow up    Pt has been having issues with diarrhea please call waiting on a response back  About having rx called into [facility]

## 2016-11-03 ENCOUNTER — Ambulatory Visit (HOSPITAL_COMMUNITY)
Admission: RE | Admit: 2016-11-03 | Discharge: 2016-11-03 | Disposition: A | Discharge status: Home / Self Care | Payer: Medicare Other | Source: Ambulatory Visit | Attending: Cardiovascular Disease | Admitting: Cardiovascular Disease

## 2016-11-03 ENCOUNTER — Encounter (HOSPITAL_COMMUNITY)
Admission: RE | Disposition: A | Discharge status: Home / Self Care | Payer: Self-pay | Source: Ambulatory Visit | Attending: Cardiovascular Disease

## 2016-11-03 ENCOUNTER — Encounter (HOSPITAL_COMMUNITY): Payer: Self-pay | Admitting: Cardiovascular Disease

## 2016-11-03 DIAGNOSIS — Z8249 Family history of ischemic heart disease and other diseases of the circulatory system: Secondary | ICD-10-CM | POA: Insufficient documentation

## 2016-11-03 DIAGNOSIS — Z823 Family history of stroke: Secondary | ICD-10-CM | POA: Diagnosis not present

## 2016-11-03 DIAGNOSIS — I2584 Coronary atherosclerosis due to calcified coronary lesion: Secondary | ICD-10-CM | POA: Diagnosis not present

## 2016-11-03 DIAGNOSIS — I350 Nonrheumatic aortic (valve) stenosis: Secondary | ICD-10-CM | POA: Insufficient documentation

## 2016-11-03 DIAGNOSIS — I5042 Chronic combined systolic (congestive) and diastolic (congestive) heart failure: Secondary | ICD-10-CM | POA: Insufficient documentation

## 2016-11-03 DIAGNOSIS — I2510 Atherosclerotic heart disease of native coronary artery without angina pectoris: Secondary | ICD-10-CM | POA: Diagnosis not present

## 2016-11-03 DIAGNOSIS — I493 Ventricular premature depolarization: Secondary | ICD-10-CM | POA: Insufficient documentation

## 2016-11-03 DIAGNOSIS — Z7982 Long term (current) use of aspirin: Secondary | ICD-10-CM | POA: Diagnosis not present

## 2016-11-03 DIAGNOSIS — I110 Hypertensive heart disease with heart failure: Secondary | ICD-10-CM | POA: Insufficient documentation

## 2016-11-03 DIAGNOSIS — I428 Other cardiomyopathies: Secondary | ICD-10-CM | POA: Insufficient documentation

## 2016-11-03 DIAGNOSIS — E785 Hyperlipidemia, unspecified: Secondary | ICD-10-CM | POA: Insufficient documentation

## 2016-11-03 DIAGNOSIS — K219 Gastro-esophageal reflux disease without esophagitis: Secondary | ICD-10-CM | POA: Insufficient documentation

## 2016-11-03 HISTORY — PX: LEFT HEART CATH AND CORONARY ANGIOGRAPHY: CATH118249

## 2016-11-03 SURGERY — LEFT HEART CATH AND CORONARY ANGIOGRAPHY
Anesthesia: LOCAL

## 2016-11-03 MED ORDER — HEPARIN SODIUM (PORCINE) 1000 UNIT/ML IJ SOLN
INTRAMUSCULAR | Status: DC | PRN
  Administered 2016-11-03: 3500 [IU] via INTRAVENOUS

## 2016-11-03 MED ORDER — VERAPAMIL HCL 2.5 MG/ML IV SOLN
INTRAVENOUS | Status: AC
  Filled 2016-11-03: qty 2

## 2016-11-03 MED ORDER — SODIUM CHLORIDE 0.9% FLUSH: 3.0000 mL | Freq: Two times a day (BID) | INTRAVENOUS | Status: DC

## 2016-11-03 MED ORDER — SODIUM CHLORIDE 0.9 % IV SOLN: INTRAVENOUS | Status: AC

## 2016-11-03 MED ORDER — IOPAMIDOL (ISOVUE-370) INJECTION 76%
INTRAVENOUS | Status: AC
  Filled 2016-11-03: qty 100

## 2016-11-03 MED ORDER — SODIUM CHLORIDE 0.9 % IV SOLN: 250.0000 mL | INTRAVENOUS | Status: DC | PRN

## 2016-11-03 MED ORDER — LIDOCAINE HCL (PF) 1 % IJ SOLN
INTRAMUSCULAR | Status: AC
  Filled 2016-11-03: qty 30

## 2016-11-03 MED ORDER — ASPIRIN 81 MG PO CHEW: 81.0000 mg | CHEWABLE_TABLET | ORAL | Status: DC

## 2016-11-03 MED ORDER — FENTANYL CITRATE (PF) 100 MCG/2ML IJ SOLN
INTRAMUSCULAR | Status: DC | PRN
  Administered 2016-11-03: 25 ug via INTRAVENOUS

## 2016-11-03 MED ORDER — SODIUM CHLORIDE 0.9 % WEIGHT BASED INFUSION: 1.0000 mL/kg/h | INTRAVENOUS | Status: DC

## 2016-11-03 MED ORDER — SODIUM CHLORIDE 0.9% FLUSH: 3.0000 mL | INTRAVENOUS | Status: DC | PRN

## 2016-11-03 MED ORDER — HEPARIN (PORCINE) IN NACL 2-0.9 UNIT/ML-% IJ SOLN
INTRAMUSCULAR | Status: AC
  Filled 2016-11-03: qty 1000

## 2016-11-03 MED ORDER — LIDOCAINE HCL (PF) 1 % IJ SOLN
INTRAMUSCULAR | Status: DC | PRN
  Administered 2016-11-03: 3 mL

## 2016-11-03 MED ORDER — HEPARIN SODIUM (PORCINE) 1000 UNIT/ML IJ SOLN
INTRAMUSCULAR | Status: AC
  Filled 2016-11-03: qty 1

## 2016-11-03 MED ORDER — MIDAZOLAM HCL 2 MG/2ML IJ SOLN
INTRAMUSCULAR | Status: DC | PRN
  Administered 2016-11-03: 1 mg via INTRAVENOUS

## 2016-11-03 MED ORDER — VERAPAMIL HCL 2.5 MG/ML IV SOLN
INTRAVENOUS | Status: DC | PRN
  Administered 2016-11-03: 10 mL via INTRA_ARTERIAL

## 2016-11-03 MED ORDER — SODIUM CHLORIDE 0.9 % WEIGHT BASED INFUSION
3.0000 mL/kg/h | INTRAVENOUS | Status: AC
  Administered 2016-11-03: 3 mL/kg/h via INTRAVENOUS

## 2016-11-03 MED ORDER — FENTANYL CITRATE (PF) 100 MCG/2ML IJ SOLN
INTRAMUSCULAR | Status: AC
  Filled 2016-11-03: qty 2

## 2016-11-03 MED ORDER — IOPAMIDOL (ISOVUE-370) INJECTION 76%
INTRAVENOUS | Status: DC | PRN
  Administered 2016-11-03: 80 mL via INTRA_ARTERIAL

## 2016-11-03 MED ORDER — HEPARIN (PORCINE) IN NACL 2-0.9 UNIT/ML-% IJ SOLN
INTRAMUSCULAR | Status: DC | PRN
  Administered 2016-11-03: 1000 mL

## 2016-11-03 MED ORDER — MIDAZOLAM HCL 2 MG/2ML IJ SOLN
INTRAMUSCULAR | Status: AC
  Filled 2016-11-03: qty 2

## 2016-11-03 SURGICAL SUPPLY — 12 items
CATH EXPO 5FR FR4 (CATHETERS) ×2 IMPLANT
CATH INFINITI 5 FR JL3.5 (CATHETERS) ×2 IMPLANT
CATH INFINITI 5FR ANG PIGTAIL (CATHETERS) ×2 IMPLANT
DEVICE RAD COMP TR BAND LRG (VASCULAR PRODUCTS) ×2 IMPLANT
GLIDESHEATH SLEND SS 6F .021 (SHEATH) ×2 IMPLANT
GUIDEWIRE INQWIRE 1.5J.035X260 (WIRE) ×1 IMPLANT
INQWIRE 1.5J .035X260CM (WIRE) ×2
KIT HEART LEFT (KITS) ×2 IMPLANT
PACK CARDIAC CATHETERIZATION (CUSTOM PROCEDURE TRAY) ×2 IMPLANT
SYR MEDRAD MARK V 150ML (SYRINGE) ×2 IMPLANT
TRANSDUCER W/STOPCOCK (MISCELLANEOUS) ×2 IMPLANT
TUBING CIL FLEX 10 FLL-RA (TUBING) ×2 IMPLANT

## 2016-11-03 NOTE — Interval H&P Note (Signed)
History and Physical Interval Note:  11/03/2016 8:45 AM  [name] Brown  has presented today for cardiac cath with the diagnosis of CAD, LV systolic dysfunction. The various methods of treatment have been discussed with the patient and family. After consideration of risks, benefits and other options for treatment, the patient has consented to  Procedure(s): Left Heart Cath and Coronary Angiography (N/A) as a surgical intervention .  The patient's history has been reviewed, patient examined, no change in status, stable for surgery.  I have reviewed the patient's chart and labs.  Questions were answered to the patient's satisfaction.    Cath Lab Visit (complete for each Cath Lab visit)  Clinical Evaluation Leading to the Procedure:   ACS: No.  Non-ACS:    Anginal Classification: No Symptoms  Anti-ischemic medical therapy: Minimal Therapy (1 class of medications)  Non-Invasive Test Results: No non-invasive testing performed  Prior CABG: No previous CABG         Lauree Chandler

## 2016-11-03 NOTE — H&P (View-Only) (Signed)
Cardiology Office Note   Date:  10/25/2016   ID:  [name] Brown, DOB 1951/03/20, MRN [id number]  PCP:  [name], MD  Cardiologist:   Skeet Latch, MD   Chief Complaint  Patient presents with  . New Patient (Initial Visit)    Pt states no Sx.       History of Present Illness: [name] Brown is a 66 y.o. 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  [name] Brown saw Dr. [name] on 09/23/16 and was noted to have coronary atherosclerosis on chest xray.  He had an echo 10/12/16 that revealed LVEF 30-35% with grade 1 diastolic dysfunction.  His aortic valve was heavily calcified and likely bicuspid.  Although the mean gradient was 14 mmHg, it was felt that he likely had moderate aortic stenosis given his reduced systolic function and leaflet restriction.   He was started on aspirin and referred to cardiology for evaluation.  [name] Brown has been feeling well.   he denies chest pain or shortness of breath. He has had mild lower extremity edema that is been unchanged for years. He denies orthopnea or PND. He walks 3 miles briskly 3-4 times per week and has no symptoms. He does report daily palpitations that last for about 1 minute. There is no associated lightheadedness, dizziness, or shortness of breath. His wife notes that he does snore but he does not have apneic episodes.   [name] Brown was treated for colon cancer with surgery only and did not require chemotherapy. His surgery was complicated by postoperative anemia and he did require transfusion. Since then he has not noted any melena or hematochezia.   His hemoglobin was most recently 12.9 on 09/23/16.  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    Past Medical History:  Diagnosis Date  . Colon cancer ([place])   . Family history of adverse  reaction to anesthesia    pt states his mother had 2 episodes of hyponatremia following anesthesia   . GERD (gastroesophageal reflux disease)   . Hyperlipidemia 10/25/2016  . Hypertension    pt is currently not taking any medications; pt states is borderline  . Melanoma ([place])   . Mild aortic stenosis 10/25/2016  . Perforated sigmoid colon ([name] Brown)   . PVC's (premature ventricular contractions) 10/25/2016  . Tinnitus   . Wears glasses     Past Surgical History:  Procedure Laterality Date  . COLOSTOMY CLOSURE N/A 08/18/2016   Procedure: COLOSTOMY CLOSURE OPEN PROCEDURE;  Surgeon: Armandina Gemma, MD;  [facility]: [facility];  Service: General;  Laterality: N/A;  . LAPAROTOMY N/A 04/25/2016   Procedure: EXPLORATORY LAPAROTOMY WITH SIGMOID COLECTOMY, COLOSTOMY;  Surgeon: Armandina Gemma, MD;  Location: [facility];  Service: General;  Laterality: N/A;  . MELANOMA EXCISION    . PARTIAL COLECTOMY Right 08/18/2016   Procedure: RIGHT COLECTOMY;  Surgeon: Armandina Gemma, MD;  [facility]: [facility];  Service: General;  Laterality: Right;     Current Outpatient Prescriptions  Medication Sig Dispense Refill  . aspirin EC 81 MG tablet Take 81 mg by mouth daily.    . famotidine (PEPCID) 20 MG tablet Take 20 mg by mouth daily as needed for heartburn. Takes at [facility]    . Melatonin 5 MG TABS Take by mouth. Takes at [facility]    . metoprolol tartrate (LOPRESSOR)  25 MG tablet 1/2 TABLET BY MOUTH TWICE A DAY 90 tablet 1  . valsartan (DIOVAN) 40 MG tablet Take 1 tablet (40 mg total) by mouth daily. 90 tablet 1   No current facility-administered medications for this visit.     Allergies:   Patient has no known allergies.    Social History:  The patient  reports that he has never smoked. He has never used smokeless tobacco. He reports that he drinks alcohol. He reports that he does not use drugs.   Family History:  The patient's family history includes Alcohol abuse in his father; CAD in his mother; Cirrhosis in his father; Dementia in his  mother; Heart attack in his father; Kidney disease in his mother; Stroke in his maternal grandmother and paternal grandmother; Vascular Disease in his mother.    ROS:  Please see the history of present illness.   Otherwise, review of systems are positive for none.   All other systems are reviewed and negative.    PHYSICAL EXAM: VS:  BP (!) 160/85   Pulse 80   Ht 5\' 7"  (1.702 m)   Wt 72.9 kg (160 lb 12.8 oz)   BMI 25.18 kg/m  , BMI Body mass index is 25.18 kg/m. GENERAL:  Well appearing HEENT:  Pupils equal round and reactive, fundi not visualized, oral mucosa unremarkable NECK:  No jugular venous distention, waveform within normal limits, carotid upstroke brisk and symmetric, no bruits, no thyromegaly LYMPHATICS:  No cervical adenopathy LUNGS:  Clear to auscultation bilaterally HEART:  Regularly irregular.   PMI not displaced or sustained,S1 and S2 within normal limits, no S3, no S4, no clicks, no rubs, III/VI mid-peaking systolic murmurs at the LUSB with radiation to the R carotid.   ABD:  Flat, positive bowel sounds normal in frequency in pitch, no bruits, no rebound, no guarding, no midline pulsatile mass, no hepatomegaly, no splenomegaly EXT:  2 plus pulses throughout, no edema, no cyanosis no clubbing SKIN:  No rashes no nodules NEURO:  Cranial nerves II through XII grossly intact, motor grossly intact throughout PSYCH:  Cognitively intact, oriented to person place and time   EKG:  EKG is ordered today. The ekg ordered today demonstrates sinus rhythm.  Ventricular bigeminy.    Recent Labs: 08/19/2016: Magnesium 1.7 09/23/2016: ALT 25; BUN 17; Creatinine, Ser 0.99; Hemoglobin 12.9; Platelets 189.0; Potassium 4.7; Sodium 139    Lipid Panel    Component Value Date/Time   CHOL 197 09/23/2016 1435   TRIG 121.0 09/23/2016 1435   HDL 59.10 09/23/2016 1435   CHOLHDL 3 09/23/2016 1435   VLDL 24.2 09/23/2016 1435   LDLCALC 113 (H) 09/23/2016 1435      Wt Readings from Last 3  Encounters:  10/25/16 72.9 kg (160 lb 12.8 oz)  09/23/16 72.3 kg (159 lb 6 oz)  09/20/16 73.3 kg (161 lb 11.2 oz)      ASSESSMENT AND PLAN:  # Chronic systolic and diastolic heart failure:  Mr. Heinbaugh' ejection fraction was noted to be low on echocardiogram. He has no symptoms of heart failure. On my personal review of his echo it appears that this is worse in the inferolateral region. We will refer him for left heart catheterization to assess for ischemia. His left ventricle is not dilated and he does not have any significant hypertrophy making it less likely that this is due to severe aortic stenosis. In addition, on reviewing his echo it does not appear that his aortic valve is apparently stenosed. He  does not recall any preceding viral illnesses consistent with post viral cardiomyopathy. This could also be due to tachycardia mediated myopathy given that he was in ventricular bigeminy today. Start metoprolol 12.5 mg twice daily and valsartan 40 mg daily.   # Mild-moderate AS:  On review of his echo it appears that there is fusion of the right and noncoronary cusps. There is slightly restricted motion but likely mild to moderate aortic stenosis. Mean gradient was 14 mmHg. This may be artificially low due to his reduced systolic function. Continue to monitor for now.   # Hypertension:  Blood pressure poorly-controlled. Adding valsartan and metoprolol as above.   # Hyperlipidemia: We will wait until after his heart catheterization to address this.   # PVCs:  Mr. Dyck was noted to have PVCs on EKG today and reports palpitations. We will start metoprolol 12.5 mg twice daily.     Current medicines are reviewed at length with the patient today.  The patient does not have concerns regarding medicines.  The following changes have been made:  Start metoprolol and valsartan  Labs/ tests ordered today include:   Orders Placed This Encounter  Procedures  . CBC with Differential/Platelet  .  Basic metabolic panel  . INR/PT  . EKG 12-Lead     Disposition:   FU with Fredricka Kohrs C. Oval Linsey, MD, [facility] in 1 month    This note was written with the assistance of speech recognition software.  Please excuse any transcriptional errors.  Signed, Kamaile Zachow C. Oval Linsey, MD, [facility]  10/25/2016 5:03 PM    [place]

## 2016-11-03 NOTE — Discharge Instructions (Signed)

## 2016-11-07 ENCOUNTER — Encounter: Payer: Self-pay | Admitting: Family Medicine

## 2016-11-24 DIAGNOSIS — K529 Noninfective gastroenteritis and colitis, unspecified: Secondary | ICD-10-CM | POA: Diagnosis not present

## 2016-11-29 ENCOUNTER — Encounter: Payer: Self-pay | Admitting: Cardiovascular Disease

## 2016-11-29 ENCOUNTER — Ambulatory Visit (INDEPENDENT_AMBULATORY_CARE_PROVIDER_SITE_OTHER): Payer: Medicare Other | Admitting: Cardiovascular Disease

## 2016-11-29 VITALS — BP 135/83 | HR 68 | Ht 66.0 in | Wt 163.2 lb

## 2016-11-29 DIAGNOSIS — I110 Hypertensive heart disease with heart failure: Secondary | ICD-10-CM | POA: Diagnosis not present

## 2016-11-29 DIAGNOSIS — L570 Actinic keratosis: Secondary | ICD-10-CM | POA: Diagnosis not present

## 2016-11-29 DIAGNOSIS — D225 Melanocytic nevi of trunk: Secondary | ICD-10-CM | POA: Diagnosis not present

## 2016-11-29 DIAGNOSIS — L821 Other seborrheic keratosis: Secondary | ICD-10-CM | POA: Diagnosis not present

## 2016-11-29 DIAGNOSIS — I2584 Coronary atherosclerosis due to calcified coronary lesion: Secondary | ICD-10-CM

## 2016-11-29 DIAGNOSIS — I493 Ventricular premature depolarization: Secondary | ICD-10-CM | POA: Diagnosis not present

## 2016-11-29 DIAGNOSIS — I350 Nonrheumatic aortic (valve) stenosis: Secondary | ICD-10-CM

## 2016-11-29 DIAGNOSIS — E7800 Pure hypercholesterolemia, unspecified: Secondary | ICD-10-CM

## 2016-11-29 DIAGNOSIS — I5022 Chronic systolic (congestive) heart failure: Secondary | ICD-10-CM

## 2016-11-29 DIAGNOSIS — L308 Other specified dermatitis: Secondary | ICD-10-CM | POA: Diagnosis not present

## 2016-11-29 DIAGNOSIS — Z85820 Personal history of malignant melanoma of skin: Secondary | ICD-10-CM | POA: Diagnosis not present

## 2016-11-29 DIAGNOSIS — D2271 Melanocytic nevi of right lower limb, including hip: Secondary | ICD-10-CM | POA: Diagnosis not present

## 2016-11-29 DIAGNOSIS — L812 Freckles: Secondary | ICD-10-CM | POA: Diagnosis not present

## 2016-11-29 DIAGNOSIS — I2510 Atherosclerotic heart disease of native coronary artery without angina pectoris: Secondary | ICD-10-CM

## 2016-11-29 DIAGNOSIS — D2272 Melanocytic nevi of left lower limb, including hip: Secondary | ICD-10-CM | POA: Diagnosis not present

## 2016-11-29 MED ORDER — ROSUVASTATIN CALCIUM 20 MG PO TABS: 20.0000 mg | ORAL_TABLET | Freq: Every day | ORAL | 3 refills | Status: DC

## 2016-11-29 MED ORDER — METOPROLOL SUCCINATE ER 25 MG PO TB24: 25.0000 mg | ORAL_TABLET | Freq: Every day | ORAL | 3 refills | Status: DC

## 2016-11-29 NOTE — Progress Notes (Signed)
Cardiology Office Note   Date:  11/29/2016   ID:  [name] Brown, DOB Mar 08, 1951, MRN [id number]  PCP:  [name] Asa, MD  Cardiologist:   Skeet Latch, MD   No chief complaint on file.     History of Present Illness: [name] Brown is a 66 y.o. 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  [name] Brown saw Dr. Annye Brown on 09/23/16 and was noted to have coronary atherosclerosis on chest xray.  He had an echo 10/12/16 that revealed LVEF 30-35% with grade 1 diastolic dysfunction.  His aortic valve was heavily calcified and likely bicuspid.  Although the mean gradient was 14 mmHg, it was felt that he likely had moderate aortic stenosis given his reduced systolic function and leaflet restriction.   He was started on aspirin and referred to cardiology for evaluation.  He was referred for cardiac catheterization 10/2016 that revealed mild-mod CAD and LVEF 45-50%.  He has been feeling well and denies chest pain, shortness of breath, lower extremity edema, orthopnea or PND.  He has been checking his BP at home and it has been around 125/75.  He walks for exercise three times per week and has been feeling well.    [name] Brown was treated for colon cancer with surgery only and did not require chemotherapy.  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     Past Medical History:  Diagnosis Date  . Colon cancer ([place])   . Family history of adverse reaction to anesthesia    pt states his mother had 2 episodes of hyponatremia following anesthesia   . GERD (gastroesophageal reflux disease)   . Hyperlipidemia 10/25/2016  . Hypertension    pt is currently not taking any medications; pt states is borderline  . Melanoma ([place])   . Mild aortic stenosis 10/25/2016  . Perforated sigmoid colon ([place])   . PVC's (premature  ventricular contractions) 10/25/2016  . Tinnitus   . Wears glasses     Past Surgical History:  Procedure Laterality Date  . COLOSTOMY CLOSURE N/A 08/18/2016   Procedure: COLOSTOMY CLOSURE OPEN PROCEDURE;  Surgeon: Armandina Gemma, MD;  [facility]: [facility];  Service: General;  Laterality: N/A;  . LAPAROTOMY N/A 04/25/2016   Procedure: EXPLORATORY LAPAROTOMY WITH SIGMOID COLECTOMY, COLOSTOMY;  Surgeon: Armandina Gemma, MD;  [facility];  Service: General;  Laterality: N/A;  . LEFT HEART CATH AND CORONARY ANGIOGRAPHY N/A 11/03/2016   Procedure: Left Heart Cath and Coronary Angiography;  Surgeon: Burnell Blanks, MD;  Location: [place] [facility];  Service: Cardiovascular;  Laterality: N/A;  . MELANOMA EXCISION    . PARTIAL COLECTOMY Right 08/18/2016   Procedure: RIGHT COLECTOMY;  Surgeon: Armandina Gemma, MD;  [facility];  Service: General;  Laterality: Right;     Current Outpatient Prescriptions  Medication Sig Dispense Refill  . aspirin EC 81 MG tablet Take 81 mg by mouth daily.    . metoprolol succinate (TOPROL-XL) 25 MG 24 hr tablet Take 1 tablet (25 mg total) by mouth daily. Take with or immediately following a meal. 90 tablet 3  . sucralfate (CARAFATE) 1 g tablet Take 4 g by mouth at bedtime.    . valsartan (DIOVAN) 40 MG tablet Take 1 tablet (40 mg total) by mouth daily. 90 tablet 1  . rosuvastatin (CRESTOR) 20 MG tablet Take  1 tablet (20 mg total) by mouth daily. 30 tablet 3   No current facility-administered medications for this visit.     Allergies:   Patient has no known allergies.    Social History:  The patient  reports that he has never smoked. He has never used smokeless tobacco. He reports that he drinks alcohol. He reports that he does not use drugs.   Family History:  The patient's family history includes Alcohol abuse in his father; CAD in his mother; Cirrhosis in his father; Dementia in his mother; Heart attack in his father; Kidney disease in his mother; Stroke in  his maternal grandmother and paternal grandmother; Vascular Disease in his mother.    ROS:  Please see the history of present illness.   Otherwise, review of systems are positive for none.   All other systems are reviewed and negative.    PHYSICAL EXAM: VS:  BP 135/83 (BP Location: Left Arm)   Pulse 68   Ht 5\' 6"  (1.676 m)   Wt 74 kg (163 lb 3.2 oz)   BMI 26.34 kg/m  , BMI Body mass index is 26.34 kg/m. GENERAL:  Well appearing HEENT:  Pupils equal round and reactive, fundi not visualized, oral mucosa unremarkable NECK:  No jugular venous distention, waveform within normal limits, carotid upstroke brisk and symmetric, no bruits, no thyromegaly LYMPHATICS:  No cervical adenopathy LUNGS:  Clear to auscultation bilaterally HEART:  Regularly irregular.   PMI not displaced or sustained,S1 and S2 within normal limits, no S3, no S4, no clicks, no rubs, III/VI mid-peaking systolic murmurs at the LUSB with radiation to the R carotid.   ABD:  Flat, positive bowel sounds normal in frequency in pitch, no bruits, no rebound, no guarding, no midline pulsatile mass, no hepatomegaly, no splenomegaly EXT:  2 plus pulses throughout, no edema, no cyanosis no clubbing SKIN:  No rashes no nodules NEURO:  Cranial nerves II through XII grossly intact, motor grossly intact throughout PSYCH:  Cognitively intact, oriented to person place and time   EKG:  EKG is ordered today. The ekg ordered today demonstrates sinus rhythm.  Ventricular bigeminy.    LHC 11/03/16:   Mid RCA to Dist RCA lesion, 10 %stenosed.  RPDA lesion, 10 %stenosed.  Ost Cx to Prox Cx lesion, 40 %stenosed.  Ost Ramus to Ramus lesion, 20 %stenosed.  Mid LAD lesion, 20 %stenosed.  Prox LAD lesion, 30 %stenosed.  The left ventricular ejection fraction is 45-50% by visual estimate.  There is mild left ventricular systolic dysfunction.  LV end diastolic pressure is normal.  There is no mitral valve regurgitation.  Recent  Labs: 08/19/2016: Magnesium 1.7 09/23/2016: ALT 25 10/25/2016: BUN 17; Creat 1.04; Hemoglobin 13.2; Platelets 198; Potassium 4.4; Sodium 137    Lipid Panel    Component Value Date/Time   CHOL 197 09/23/2016 1435   TRIG 121.0 09/23/2016 1435   HDL 59.10 09/23/2016 1435   CHOLHDL 3 09/23/2016 1435   VLDL 24.2 09/23/2016 1435   LDLCALC 113 (H) 09/23/2016 1435      Wt Readings from Last 3 Encounters:  11/29/16 74 kg (163 lb 3.2 oz)  11/03/16 69.5 kg (153 lb 4.8 oz)  10/25/16 72.9 kg (160 lb 12.8 oz)      ASSESSMENT AND PLAN:  # Chronic systolic and diastolic heart failure:  Mr. Dapolito' ejection fraction was noted to be low on echocardiogram  He didn't have any significant CAD on LHC.  LVEF was 45-50% on left ventriculography.  Continue  metoprolol and valsartan.  He doesn't have any evidence of heart failure on exam.  We will check an echo 01/2017.  # Non-obstructive CAD:  # Hyperlipidemia:  Continue aspirin and start rosuvastatin 20 mg daily.  Repeat lipids and CMP in 6 weeks.  # Mild-moderate AS:  On review of his echo it appears that there is fusion of the right and noncoronary cusps. There is slightly restricted motion but likely mild to moderate aortic stenosis. Mean gradient was 14 mmHg. This may be artificially low due to his reduced systolic function. Repeat echo 01/2017.  # Hypertension:  Blood pressure is well-controlled on valsartan and metoprolol.  # PVCs:  Better on metoprolol.     Current medicines are reviewed at length with the patient today.  The patient does not have concerns regarding medicines.  The following changes have been made:  Start rosuvastatin.  Labs/ tests ordered today include:   Orders Placed This Encounter  Procedures  . Lipid panel  . Comprehensive Metabolic Panel (CMET)  . ECHOCARDIOGRAM COMPLETE     Disposition:   FU with Naleah Kofoed C. Oval Linsey, MD, [facility] in 3 months.    This note was written with the assistance of speech recognition  software.  Please excuse any transcriptional errors.  Signed, Verdean Murin C. Oval Linsey, MD, [facility]  11/29/2016 12:22 PM    [facility]

## 2016-11-29 NOTE — Patient Instructions (Signed)
Medication Instructions:  START Crestor 20 mg Take 1 tablet once a day  Labwork: Your physician recommends that you return for lab work in: COMPLETE IN 3 MONTHS FASTING ON SAME MORNING ECHO  Testing/Procedures: Your physician has requested that you have an echocardiogram. Echocardiography is a painless test that uses sound waves to create images of your heart. It provides your doctor with information about the size and shape of your heart and how well your heart's chambers and valves are working. This procedure takes approximately one hour. There are no restrictions for this procedure.  SCHEDULE FOR MID-MAY  Follow-Up: Your physician recommends that you schedule a follow-up appointment in: MID-MAY AFTER ECHO WITH DR [place]i [place].   Any Other Special Instructions Will Be Listed Below (If Applicable).     If you need a refill on your cardiac medications before your next appointment, please call your pharmacy. `

## 2017-01-03 DIAGNOSIS — K529 Noninfective gastroenteritis and colitis, unspecified: Secondary | ICD-10-CM | POA: Diagnosis not present

## 2017-01-17 ENCOUNTER — Ambulatory Visit (HOSPITAL_COMMUNITY): Payer: Medicare Other | Attending: Cardiovascular Disease

## 2017-01-17 ENCOUNTER — Other Ambulatory Visit: Payer: Medicare Other | Admitting: *Deleted

## 2017-01-17 ENCOUNTER — Other Ambulatory Visit: Payer: Self-pay

## 2017-01-17 DIAGNOSIS — I352 Nonrheumatic aortic (valve) stenosis with insufficiency: Secondary | ICD-10-CM | POA: Insufficient documentation

## 2017-01-17 DIAGNOSIS — I2510 Atherosclerotic heart disease of native coronary artery without angina pectoris: Secondary | ICD-10-CM | POA: Diagnosis not present

## 2017-01-17 DIAGNOSIS — I350 Nonrheumatic aortic (valve) stenosis: Secondary | ICD-10-CM

## 2017-01-17 DIAGNOSIS — I110 Hypertensive heart disease with heart failure: Secondary | ICD-10-CM | POA: Diagnosis not present

## 2017-01-17 DIAGNOSIS — I5022 Chronic systolic (congestive) heart failure: Secondary | ICD-10-CM | POA: Diagnosis not present

## 2017-01-17 DIAGNOSIS — E785 Hyperlipidemia, unspecified: Secondary | ICD-10-CM | POA: Insufficient documentation

## 2017-01-17 DIAGNOSIS — R011 Cardiac murmur, unspecified: Secondary | ICD-10-CM

## 2017-01-17 DIAGNOSIS — I493 Ventricular premature depolarization: Secondary | ICD-10-CM

## 2017-01-17 DIAGNOSIS — E7800 Pure hypercholesterolemia, unspecified: Secondary | ICD-10-CM | POA: Diagnosis not present

## 2017-01-17 DIAGNOSIS — I10 Essential (primary) hypertension: Secondary | ICD-10-CM

## 2017-01-17 LAB — COMPREHENSIVE METABOLIC PANEL
ALBUMIN: 4.5 g/dL (ref 3.6–4.8)
ALK PHOS: 69 IU/L (ref 39–117)
ALT: 51 IU/L — ABNORMAL HIGH (ref 0–44)
AST: 45 IU/L — AB (ref 0–40)
Albumin/Globulin Ratio: 2.5 — ABNORMAL HIGH (ref 1.2–2.2)
BILIRUBIN TOTAL: 0.7 mg/dL (ref 0.0–1.2)
BUN / CREAT RATIO: 14 (ref 10–24)
BUN: 14 mg/dL (ref 8–27)
CHLORIDE: 99 mmol/L (ref 96–106)
CO2: 25 mmol/L (ref 18–29)
CREATININE: 0.98 mg/dL (ref 0.76–1.27)
Calcium: 9.5 mg/dL (ref 8.6–10.2)
GFR calc Af Amer: 92 mL/min/{1.73_m2} (ref >59)
GFR calc non Af Amer: 80 mL/min/{1.73_m2} (ref >59)
GLUCOSE: 84 mg/dL (ref 65–99)
Globulin, Total: 1.8 g/dL (ref 1.5–4.5)
Potassium: 4.8 mmol/L (ref 3.5–5.2)
SODIUM: 138 mmol/L (ref 134–144)
Total Protein: 6.3 g/dL (ref 6.0–8.5)

## 2017-01-17 LAB — LIPID PANEL
CHOLESTEROL TOTAL: 137 mg/dL (ref 100–199)
Chol/HDL Ratio: 2.2 ratio (ref 0.0–5.0)
HDL: 62 mg/dL (ref >39)
LDL Calculated: 65 mg/dL (ref 0–99)
TRIGLYCERIDES: 49 mg/dL (ref 0–149)
VLDL CHOLESTEROL CAL: 10 mg/dL (ref 5–40)

## 2017-01-17 NOTE — Addendum Note (Signed)
Addended by: Eulis Foster on: 01/17/2017 09:25 AM   Modules accepted: Orders

## 2017-01-26 ENCOUNTER — Telehealth: Payer: Self-pay | Admitting: Cardiovascular Disease

## 2017-01-26 DIAGNOSIS — E7800 Pure hypercholesterolemia, unspecified: Secondary | ICD-10-CM

## 2017-01-26 MED ORDER — ROSUVASTATIN CALCIUM 20 MG PO TABS: 10.0000 mg | ORAL_TABLET | Freq: Every day | ORAL | 3 refills | Status: DC

## 2017-01-26 NOTE — Telephone Encounter (Signed)
-----   Message from Skeet Latch, MD sent at 01/24/2017 10:45 PM EDT ----- Normal kidney function and electrolytes.  Liver function is mildly elevated.  Reduce to 10 mg and we will repeat LFTs at follow up.

## 2017-01-26 NOTE — Telephone Encounter (Signed)
New message   Pt is calling returning call to RN.

## 2017-01-26 NOTE — Telephone Encounter (Signed)
Spoke to patient and communicated changes. He voiced acknowledgment and also received his other results via mychart. He will proceed w instructions for 10mg  daily of crestor -- states he'll cut current tabs in half.  Changes to regimen per Dr. Oval Linsey have been updated in pt's medication reconciliation.

## 2017-02-03 ENCOUNTER — Encounter: Payer: Self-pay | Admitting: Cardiovascular Disease

## 2017-02-03 ENCOUNTER — Ambulatory Visit (INDEPENDENT_AMBULATORY_CARE_PROVIDER_SITE_OTHER): Payer: Medicare Other | Admitting: Cardiovascular Disease

## 2017-02-03 VITALS — BP 130/77 | HR 68 | Ht 66.0 in | Wt 165.4 lb

## 2017-02-03 DIAGNOSIS — I350 Nonrheumatic aortic (valve) stenosis: Secondary | ICD-10-CM

## 2017-02-03 DIAGNOSIS — I2584 Coronary atherosclerosis due to calcified coronary lesion: Secondary | ICD-10-CM

## 2017-02-03 DIAGNOSIS — E7800 Pure hypercholesterolemia, unspecified: Secondary | ICD-10-CM | POA: Diagnosis not present

## 2017-02-03 DIAGNOSIS — R7989 Other specified abnormal findings of blood chemistry: Secondary | ICD-10-CM

## 2017-02-03 DIAGNOSIS — I2510 Atherosclerotic heart disease of native coronary artery without angina pectoris: Secondary | ICD-10-CM

## 2017-02-03 DIAGNOSIS — I5042 Chronic combined systolic (congestive) and diastolic (congestive) heart failure: Secondary | ICD-10-CM | POA: Diagnosis not present

## 2017-02-03 DIAGNOSIS — I110 Hypertensive heart disease with heart failure: Secondary | ICD-10-CM | POA: Diagnosis not present

## 2017-02-03 DIAGNOSIS — R945 Abnormal results of liver function studies: Secondary | ICD-10-CM

## 2017-02-03 LAB — COMPREHENSIVE METABOLIC PANEL
A/G RATIO: 2.5 — AB (ref 1.2–2.2)
ALBUMIN: 4.8 g/dL (ref 3.6–4.8)
ALT: 54 IU/L — ABNORMAL HIGH (ref 0–44)
AST: 35 IU/L (ref 0–40)
Alkaline Phosphatase: 77 IU/L (ref 39–117)
BUN/Creatinine Ratio: 20 (ref 10–24)
BUN: 20 mg/dL (ref 8–27)
Bilirubin Total: 0.8 mg/dL (ref 0.0–1.2)
CALCIUM: 9.7 mg/dL (ref 8.6–10.2)
CO2: 24 mmol/L (ref 18–29)
CREATININE: 1 mg/dL (ref 0.76–1.27)
Chloride: 99 mmol/L (ref 96–106)
GFR, EST AFRICAN AMERICAN: 90 mL/min/{1.73_m2} (ref >59)
GFR, EST NON AFRICAN AMERICAN: 78 mL/min/{1.73_m2} (ref >59)
Globulin, Total: 1.9 g/dL (ref 1.5–4.5)
Glucose: 86 mg/dL (ref 65–99)
Potassium: 4.6 mmol/L (ref 3.5–5.2)
SODIUM: 139 mmol/L (ref 134–144)
TOTAL PROTEIN: 6.7 g/dL (ref 6.0–8.5)

## 2017-02-03 NOTE — Patient Instructions (Signed)
Medication Instructions:  Your physician recommends that you continue on your current medications as directed. Please refer to the Current Medication list given to you today.  Labwork: CMET TODAY   Testing/Procedures: Your physician has requested that you have a stress echocardiogram. For further information please visit [email or web address]. Please follow instruction sheet as given. DOBUTAMINE STRESS ECHO  Your physician has requested that you have a cardiac MRI. Cardiac MRI uses a computer to create images of your heart as its beating, producing both still and moving pictures of your heart and major blood vessels. For further information please visit [email or web address] Please follow the instruction sheet given to you today for more information.  Follow-Up: Your physician recommends that you schedule a follow-up appointment in: 2 MONTH OV  If you need a refill on your cardiac medications before your next appointment, please call your pharmacy.

## 2017-02-03 NOTE — Progress Notes (Signed)
Cardiology Office Note   Date:  02/03/2017   ID:  [name] Brown, DOB May 10, 1951, MRN [id number]  PCP:  [name], MD  Cardiologist:   Skeet Latch, MD   Chief Complaint  Patient presents with  . Follow-up    echo; Pt states no Sx.      History of Present Illness: [name] Brown is a 66 y.o. 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  [name] Brown saw Dr. [name] on 09/23/16 and was noted to have coronary atherosclerosis on chest xray.  He had an echo 10/12/16 that revealed LVEF 30-35% with grade 1 diastolic dysfunction.  His aortic valve was heavily calcified and likely bicuspid.  Although the mean gradient was 14 mmHg, it was felt that he likely had moderate aortic stenosis given his reduced systolic function and leaflet restriction.   He was started on aspirin and referred to cardiology for evaluation.  He was referred for cardiac catheterization 10/2016 that revealed mild-mod CAD and LVEF 45-50%.  He has been asymptomatic.  [name] Brown was referred for a repeat echo 01/17/17 that revealed LVEF 30-35% with grade 1 diastolic dysfunction. There was fusion of the right and noncoronary cusps and leaflet mobility was severely restricted. The mean gradient was 16 mmHg.  His last appointment [name] Brown was started on rosuvavastatin.  LFTs were mildly increased, so this was reduced to 10 mg.  He continues to feel well.  He denies chest pain or shortness of breath. He also has not noted any lower extremity edema, orthopnea, or PND. He continues to exercise regularly.  He walks 3.5 miles 3-4 times per week and went kayaking for 7 hours last week.  He does house and yard work without symptoms.  He notes that his BP is sometimes low in the AM but improves throughout the day,  [name] Brown was treated for colon cancer with surgery only and did not  require chemotherapy.  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     Past Medical History:  Diagnosis Date  . Colon cancer ([place])   . Family history of adverse reaction to anesthesia    pt states his mother had 2 episodes of hyponatremia following anesthesia   . GERD (gastroesophageal reflux disease)   . Hyperlipidemia 10/25/2016  . Hypertension    pt is currently not taking any medications; pt states is borderline  . Melanoma ([place])   . Mild aortic stenosis 10/25/2016  . Perforated sigmoid colon ([place])   . PVC's (premature ventricular contractions) 10/25/2016  . Tinnitus   . Wears glasses     Past Surgical History:  Procedure Laterality Date  . COLOSTOMY CLOSURE N/A 08/18/2016   Procedure: COLOSTOMY CLOSURE OPEN PROCEDURE;  Surgeon: Armandina Gemma, MD;  [facility];  Service: General;  Laterality: N/A;  . LAPAROTOMY N/A 04/25/2016   Procedure: EXPLORATORY LAPAROTOMY WITH SIGMOID COLECTOMY, COLOSTOMY;  Surgeon: Armandina Gemma, MD;  [facility];  Service: General;  Laterality: N/A;  . LEFT HEART CATH AND CORONARY ANGIOGRAPHY N/A 11/03/2016   Procedure: Left Heart Cath and Coronary Angiography;  Surgeon: Burnell Blanks, MD;  Location: [place] [facility];  Service: Cardiovascular;  Laterality: N/A;  . MELANOMA EXCISION    . PARTIAL COLECTOMY Right 08/18/2016   Procedure: RIGHT COLECTOMY;  Surgeon: Armandina Gemma, MD;  [facility];  Service: General;  Laterality: Right;     Current Outpatient Prescriptions  Medication Sig Dispense Refill  . aspirin EC 81 MG tablet Take 81 mg by mouth daily.    . colestipol (COLESTID) 1 g tablet Take 1 g by mouth daily as needed.    . metoprolol succinate (TOPROL-XL) 25 MG 24 hr tablet Take 1 tablet (25 mg total) by mouth daily. Take with or immediately following a meal. 90 tablet 3  . rosuvastatin (CRESTOR) 20 MG tablet Take 0.5 tablets (10 mg total) by mouth daily. 30 tablet 3  . sucralfate  (CARAFATE) 1 g tablet Take 4 g by mouth at bedtime.    . valsartan (DIOVAN) 40 MG tablet Take 1 tablet (40 mg total) by mouth daily. 90 tablet 1   No current facility-administered medications for this visit.     Allergies:   Patient has no known allergies.    Social History:  The patient  reports that he has never smoked. He has never used smokeless tobacco. He reports that he drinks alcohol. He reports that he does not use drugs.   Family History:  The patient's family history includes Alcohol abuse in his father; CAD in his mother; Cirrhosis in his father; Dementia in his mother; Heart attack in his father; Kidney disease in his mother; Stroke in his maternal grandmother and paternal grandmother; Vascular Disease in his mother.    ROS:  Please see the history of present illness.   Otherwise, review of systems are positive for none.   All other systems are reviewed and negative.    PHYSICAL EXAM: VS:  BP 130/77   Pulse 68   Ht 5\' 6"  (1.676 m)   Wt 75 kg (165 lb 6.4 oz)   BMI 26.70 kg/m  , BMI Body mass index is 26.7 kg/m. GENERAL:  Well appearing.  No acute distress. HEENT:  Pupils equal round and reactive, fundi not visualized, oral mucosa unremarkable NECK:  No jugular venous distention, waveform within normal limits, carotid upstroke brisk and symmetric, no bruits LUNGS:  Clear to auscultation bilaterally. No crackles, wheezes, rhonchi.  HEART:  RRR.  PMI not displaced or sustained,S1 and S2 within normal limits, no S3, no S4, no clicks, no rubs, III/VI mid-peaking systolic murmurs at the LUSB with radiation to the R carotid.   ABD:  Flat, positive bowel sounds normal in frequency in pitch, no bruits, no rebound, no guarding, no midline pulsatile mass, no hepatomegaly, no splenomegaly EXT:  2 plus pulses throughout, no edema, no cyanosis no clubbing SKIN:  No rashes no nodules NEURO:  Cranial nerves II through XII grossly intact, motor grossly intact throughout PSYCH:   Cognitively intact, oriented to person place and time   EKG:  EKG is not ordered today. The ekg ordered 10/25/16 demonstrates sinus rhythm.  Ventricular bigeminy.   Echo 01/17/17: Study Conclusions  - Left ventricle: The cavity size was normal. Wall thickness was   normal. Systolic function was moderately to severely reduced. The   estimated ejection fraction was in the range of 30% to 35%.   Moderate diffuse hypokinesis with no identifiable regional   variations. Doppler parameters are consistent with abnormal left   ventricular relaxation (grade 1 diastolic dysfunction). - Ventricular septum: Septal motion showed paradox. These changes   are consistent with a left bundle branch block. - Aortic valve: Possibly bicuspid; severely thickened, severely   calcified leaflets; fusion of the right-noncoronary commissure.   Anterior cusp mobility was  severely restricted. Transvalvular   velocity was increased, due to low cardiac output. There was   moderate to severe stenosis. There was mild regurgitation. - Right ventricle: Systolic function was mildly reduced.  Impressions:  - Suspect low gradient severe aortic stenosis.  Recommendations:  Consider dobutamine echo.  LHC 11/03/16:   Mid RCA to Dist RCA lesion, 10 %stenosed.  RPDA lesion, 10 %stenosed.  Ost Cx to Prox Cx lesion, 40 %stenosed.  Ost Ramus to Ramus lesion, 20 %stenosed.  Mid LAD lesion, 20 %stenosed.  Prox LAD lesion, 30 %stenosed.  The left ventricular ejection fraction is 45-50% by visual estimate.  There is mild left ventricular systolic dysfunction.  LV end diastolic pressure is normal.  There is no mitral valve regurgitation.   Recent Labs: 08/19/2016: Magnesium 1.7 10/25/2016: Hemoglobin 13.2; Platelets 198 01/17/2017: ALT 51; BUN 14; Creatinine, Ser 0.98; Potassium 4.8; Sodium 138    Lipid Panel    Component Value Date/Time   CHOL 137 01/17/2017 0925   TRIG 49 01/17/2017 0925   HDL 62  01/17/2017 0925   CHOLHDL 2.2 01/17/2017 0925   CHOLHDL 3 09/23/2016 1435   VLDL 24.2 09/23/2016 1435   LDLCALC 65 01/17/2017 0925      Wt Readings from Last 3 Encounters:  02/03/17 75 kg (165 lb 6.4 oz)  11/29/16 74 kg (163 lb 3.2 oz)  11/03/16 69.5 kg (153 lb 4.8 oz)      ASSESSMENT AND PLAN:  # Chronic systolic and diastolic heart failure:  Mr. Gravlin' ejection fraction was noted to be low on echocardiogram  He didn't have any significant CAD on LHC.  LVEF was 45-50% on left ventriculography Repeat echo again noted LV systolic function of 93-90%. He is completely asymptomatic. He has been on metoprolol and valsartan for >3 months.  We will get a cardiac MRI both for assessment of etiology and quantification of LVEF.  If it remains less than or equal to 35% we will refer him for an ICD .  Continue metoprolol and valsartan.   # Non-obstructive CAD:  # Hyperlipidemia:  Continue aspirin and rosuvastatin 10 mg daily.  Check CMP today.  Fasting lipids in 1 month.   # Mild-moderate AS:   # Bicuspid aortic valve: [name] Brown has fusion of the right and noncoronary cusps. There is slightly restricted motion but likely mild to moderate aortic stenosis. Mean gradient was 14 mmHg. This may be artificially low due to his reduced systolic function. Given his reduced systolic function we will get a dobuatmine echo to ensure that he does not have severe aortic stenosis and that this is not the cause of his reduced systolic function.   # Hypertension:  Blood pressure is well-controlled on valsartan and metoprolol.  # PVCs:  Better on metoprolol.     Current medicines are reviewed at length with the patient today.  The patient does not have concerns regarding medicines.  The following changes have been made:  None Labs/ tests ordered today include:   Orders Placed This Encounter  Procedures  . MR Card Morphology Wo/W Cm  . Comprehensive metabolic panel  . ECHOCARDIOGRAM STRESS TEST      Disposition:   FU with Grae Cannata C. Oval Linsey, MD, [facility] in 2 months.    This note was written with the assistance of speech recognition software.  Please excuse any transcriptional errors.  Signed, Aadhira Heffernan C. Oval Linsey, MD, [facility]  02/03/2017 10:30 AM    [place]

## 2017-02-07 ENCOUNTER — Ambulatory Visit (INDEPENDENT_AMBULATORY_CARE_PROVIDER_SITE_OTHER): Payer: Medicare Other | Admitting: Physician Assistant

## 2017-02-07 ENCOUNTER — Encounter: Payer: Self-pay | Admitting: Physician Assistant

## 2017-02-07 VITALS — BP 142/82 | HR 70 | Temp 97.9°F | Resp 14 | Ht 66.0 in | Wt 166.0 lb

## 2017-02-07 DIAGNOSIS — M546 Pain in thoracic spine: Secondary | ICD-10-CM

## 2017-02-07 DIAGNOSIS — I2510 Atherosclerotic heart disease of native coronary artery without angina pectoris: Secondary | ICD-10-CM | POA: Diagnosis not present

## 2017-02-07 DIAGNOSIS — S46811A Strain of other muscles, fascia and tendons at shoulder and upper arm level, right arm, initial encounter: Secondary | ICD-10-CM

## 2017-02-07 DIAGNOSIS — I2584 Coronary atherosclerosis due to calcified coronary lesion: Secondary | ICD-10-CM | POA: Diagnosis not present

## 2017-02-07 MED ORDER — CYCLOBENZAPRINE HCL 5 MG PO TABS: 5.0000 mg | ORAL_TABLET | Freq: Every day | ORAL | 0 refills | Status: DC

## 2017-02-07 NOTE — Progress Notes (Signed)
Pre visit review using our clinic review tool, if applicable. No additional management support is needed unless otherwise documented below in the visit note. 

## 2017-02-07 NOTE — Progress Notes (Signed)
Patient presents to clinic today c/o R thoracic back pain x 3 days. Is sharp in nature and non-radiating.  Pain is about 5/10. Symptoms started Saturday morning. Patient noted moving heavy equipment the night before. Patient is aggravated with most ROM. Is alleviated with laying down. Patient has taken some Ibuprofen 200 mg with only mild relief of symptoms.   Past Medical History:  Diagnosis Date  . Colon cancer ([place])   . Family history of adverse reaction to anesthesia    pt states his mother had 2 episodes of hyponatremia following anesthesia   . GERD (gastroesophageal reflux disease)   . Hyperlipidemia 10/25/2016  . Hypertension    pt is currently not taking any medications; pt states is borderline  . Melanoma ([place])   . Mild aortic stenosis 10/25/2016  . Perforated sigmoid colon ([place])   . PVC's (premature ventricular contractions) 10/25/2016  . Tinnitus   . Wears glasses     Current Outpatient Prescriptions on File Prior to Visit  Medication Sig Dispense Refill  . aspirin EC 81 MG tablet Take 81 mg by mouth daily.    . colestipol (COLESTID) 1 g tablet Take 1 g by mouth daily as needed.    . metoprolol succinate (TOPROL-XL) 25 MG 24 hr tablet Take 1 tablet (25 mg total) by mouth daily. Take with or immediately following a meal. 90 tablet 3  . rosuvastatin (CRESTOR) 20 MG tablet Take 0.5 tablets (10 mg total) by mouth daily. 30 tablet 3  . sucralfate (CARAFATE) 1 g tablet Take 4 g by mouth at bedtime.    . valsartan (DIOVAN) 40 MG tablet Take 1 tablet (40 mg total) by mouth daily. 90 tablet 1   No current facility-administered medications on file prior to visit.     No Known Allergies  Family History  Problem Relation Age of Onset  . Dementia Mother   . Kidney disease Mother   . Vascular Disease Mother   . CAD Mother   . Heart attack Father   . Alcohol abuse Father   . Cirrhosis Father   . Stroke Maternal Grandmother   . Stroke Paternal Grandmother     Social History     Social History  . Marital status: Married    Spouse name: N/A  . Number of children: N/A  . Years of education: N/A   Social History Main Topics  . Smoking status: Never Smoker  . Smokeless tobacco: Never Used  . Alcohol use Yes     Comment: 2-3 glasses of wine daily   . Drug use: No  . Sexual activity: Not Asked   Other Topics Concern  . None   Social History Narrative  . None   Review of Systems - See HPI.  All other ROS are negative.  BP (!) 142/82   Pulse 70   Temp 97.9 F (36.6 C) (Oral)   Resp 14   Ht 5\' 6"  (1.676 m)   Wt 166 lb (75.3 kg)   SpO2 96%   BMI 26.79 kg/m   Physical Exam  Constitutional: He is oriented to person, place, and time and well-developed, well-nourished, and in no distress.  HENT:  Head: Normocephalic and atraumatic.  Cardiovascular: Normal rate, regular rhythm, normal heart sounds and intact distal pulses.   Pulmonary/Chest: Effort normal and breath sounds normal. No respiratory distress. He has no wheezes. He has no rales. He exhibits no tenderness.  Neurological: He is alert and oriented to person, place, and time.  Skin: Skin is warm and dry. No rash noted.  Vitals reviewed.   Recent Results (from the past 2160 hour(s))  Comprehensive metabolic panel     Status: Abnormal   Collection Time: 01/17/17  9:25 AM  Result Value Ref Range   Glucose 84 65 - 99 mg/dL   BUN 14 8 - 27 mg/dL   Creatinine, Ser 0.98 0.76 - 1.27 mg/dL   GFR calc non Af Amer 80 >59 mL/min/1.73   GFR calc Af Amer 92 >59 mL/min/1.73   BUN/Creatinine Ratio 14 10 - 24   Sodium 138 134 - 144 mmol/L   Potassium 4.8 3.5 - 5.2 mmol/L   Chloride 99 96 - 106 mmol/L   CO2 25 18 - 29 mmol/L   Calcium 9.5 8.6 - 10.2 mg/dL   Total Protein 6.3 6.0 - 8.5 g/dL   Albumin 4.5 3.6 - 4.8 g/dL   Globulin, Total 1.8 1.5 - 4.5 g/dL   Albumin/Globulin Ratio 2.5 (H) 1.2 - 2.2   Bilirubin Total 0.7 0.0 - 1.2 mg/dL   Alkaline Phosphatase 69 39 - 117 IU/L   AST 45 (H) 0 - 40 IU/L    ALT 51 (H) 0 - 44 IU/L  Lipid panel     Status: None   Collection Time: 01/17/17  9:25 AM  Result Value Ref Range   Cholesterol, Total 137 100 - 199 mg/dL   Triglycerides 49 0 - 149 mg/dL   HDL 62 >39 mg/dL   VLDL Cholesterol Cal 10 5 - 40 mg/dL   LDL Calculated 65 0 - 99 mg/dL   Chol/HDL Ratio 2.2 0.0 - 5.0 ratio    Comment:                                   T. Chol/HDL Ratio                                             Men  Women                               1/2 Avg.Risk  3.4    3.3                                   Avg.Risk  5.0    4.4                                2X Avg.Risk  9.6    7.1                                3X Avg.Risk 23.4   11.0   Comprehensive metabolic panel     Status: Abnormal   Collection Time: 02/03/17 10:23 AM  Result Value Ref Range   Glucose 86 65 - 99 mg/dL   BUN 20 8 - 27 mg/dL   Creatinine, Ser 1.00 0.76 - 1.27 mg/dL   GFR calc non Af Amer 78 >59 mL/min/1.73   GFR calc Af Amer 90 >59 mL/min/1.73   BUN/Creatinine Ratio 20 10 - 24  Sodium 139 134 - 144 mmol/L   Potassium 4.6 3.5 - 5.2 mmol/L   Chloride 99 96 - 106 mmol/L   CO2 24 18 - 29 mmol/L    Comment: **Effective February 20, 2017 Carbon Dioxide, Total**   reference interval will be changing to:              Age                  Male          Male      0 days   - 30 days         44 - 44        16 - 70     31 days   -  1 year         15 - 25        15 - 25      2 years  -  5 years        58 - 34        17 - 49      6 years  - 12 years        32 - 65        19 - 62                >12 years        7 - 42        20 - 29    Calcium 9.7 8.6 - 10.2 mg/dL   Total Protein 6.7 6.0 - 8.5 g/dL   Albumin 4.8 3.6 - 4.8 g/dL   Globulin, Total 1.9 1.5 - 4.5 g/dL   Albumin/Globulin Ratio 2.5 (H) 1.2 - 2.2   Bilirubin Total 0.8 0.0 - 1.2 mg/dL   Alkaline Phosphatase 77 39 - 117 IU/L   AST 35 0 - 40 IU/L   ALT 54 (H) 0 - 44 IU/L    Assessment/Plan: 1. Trapezius strain, right, initial encounter 2.  Acute right-sided thoracic back pain Muscular. Atraumatic. Supportive measures discussed. Flexeril QHS. OTC medications reviewed.   Leeanne Rio, PA-C

## 2017-02-07 NOTE — Patient Instructions (Signed)
Please continue heating pad to the affected area. No heavy lifting or overexertion.  Take Ibuprofen or tylenol arthritis as needed for pain. Do not take more than as directed. Take the Flexeril in the evening as directed.  Symptoms will gradually resolve.

## 2017-02-10 ENCOUNTER — Telehealth: Payer: Self-pay | Admitting: Cardiovascular Disease

## 2017-02-10 DIAGNOSIS — R945 Abnormal results of liver function studies: Secondary | ICD-10-CM

## 2017-02-10 DIAGNOSIS — R7989 Other specified abnormal findings of blood chemistry: Secondary | ICD-10-CM

## 2017-02-10 NOTE — Telephone Encounter (Signed)
Advised patient, verbalized understanding  

## 2017-02-10 NOTE — Telephone Encounter (Signed)
New message       Calling to confirm that patient got mychart message to stop rosuvastatin.

## 2017-02-10 NOTE — Telephone Encounter (Signed)
Returned call to patient-left message with instructions (ok per DPR):  Notes recorded by Theodore Demark, RN on 01/26/2017 at 12:44 PM EDT Communicated results w patient via phone. ------  Notes recorded by Earvin Hansen on 01/25/2017 at 6:01 PM EDT Left message as well as released in mychart ------  Notes recorded by Alvina Filbert B on 01/25/2017 at 1:53 PM EDT Released in my chart with Dr Blenda Mounts comments attached ------  Notes recorded by Skeet Latch, MD on 01/24/2017 at 10:45 PM EDT Normal kidney function and electrolytes. Liver function is mildly elevated. Reduce to 10 mg and we will repeat LFTs at follow up.    Advised to call with further questions or concerns.

## 2017-02-10 NOTE — Telephone Encounter (Signed)
Notes recorded by Skeet Latch, MD on 02/10/2017 at 9:13 AM EDT Normal kidney function.Liver function remains mildly elevated. Stop rosuvastatin. Repeat LFTs in one month.

## 2017-02-10 NOTE — Telephone Encounter (Signed)
Follow up   Patient calling back to speak with triage.

## 2017-02-10 NOTE — Telephone Encounter (Signed)
Spoke with pt he states that he received a message from Dr Oval Linsey dated today telling him to stop rosuvastatin. He was wondering if this is correct.

## 2017-02-14 ENCOUNTER — Telehealth: Payer: Self-pay | Admitting: *Deleted

## 2017-02-14 NOTE — Telephone Encounter (Signed)
Patient calling regarding Flexeril that Einar Pheasant had called in for him from his last appt.   Pt states that script was written for 1 daily at bedtime, and he has been taking 1 in AM and 1 in PM - and he is now out of medication.  He is asking if there is any way that someone can give him 15 days worth just to get him through the rest of this muscle issue that he has been having.    Patient is aware that I will have to forward to provider, and that she is not in the office this afternoon so it will be tomorrow before we can let him know.  Pt stated verbal understanding.

## 2017-02-15 MED ORDER — CYCLOBENZAPRINE HCL 5 MG PO TABS: 5.0000 mg | ORAL_TABLET | Freq: Two times a day (BID) | ORAL | 0 refills | Status: DC

## 2017-02-15 NOTE — Telephone Encounter (Signed)
Ok for Flexeril 5mg  BID, #20, no refills

## 2017-02-15 NOTE — Telephone Encounter (Signed)
RX called in. Patient aware.

## 2017-02-16 ENCOUNTER — Telehealth (HOSPITAL_COMMUNITY): Payer: Self-pay | Admitting: *Deleted

## 2017-02-16 NOTE — Telephone Encounter (Signed)
Left message on voicemail per DPR in reference to upcoming appointment scheduled on 02/22/17 at 2:30 with detailed instructions given per Stress Test Requisition Sheet for the test. LM to arrive 30 minutes early, and that it is imperative to arrive on time for appointment to keep from having the test rescheduled. If you need to cancel or reschedule your appointment, please call the office within 24 hours of your appointment. Failure to do so may result in a cancellation of your appointment, and a $50 no show fee. Phone number given for call back for any questions. [name] Brown

## 2017-02-17 ENCOUNTER — Encounter: Payer: Self-pay | Admitting: Cardiovascular Disease

## 2017-02-20 ENCOUNTER — Ambulatory Visit (HOSPITAL_COMMUNITY)
Admission: RE | Admit: 2017-02-20 | Discharge: 2017-02-20 | Disposition: A | Discharge status: Home / Self Care | Payer: Medicare Other | Source: Ambulatory Visit | Attending: Cardiovascular Disease | Admitting: Cardiovascular Disease

## 2017-02-20 DIAGNOSIS — I5042 Chronic combined systolic (congestive) and diastolic (congestive) heart failure: Secondary | ICD-10-CM

## 2017-02-20 DIAGNOSIS — I350 Nonrheumatic aortic (valve) stenosis: Secondary | ICD-10-CM | POA: Diagnosis not present

## 2017-02-20 DIAGNOSIS — I068 Other rheumatic aortic valve diseases: Secondary | ICD-10-CM | POA: Diagnosis not present

## 2017-02-20 LAB — CREATININE, SERUM
CREATININE: 1.08 mg/dL (ref 0.61–1.24)
GFR calc Af Amer: >60 mL/min (ref >60)
GFR calc non Af Amer: >60 mL/min (ref >60)

## 2017-02-20 MED ORDER — GADOBENATE DIMEGLUMINE 529 MG/ML IV SOLN
23.0000 mL | Freq: Once | INTRAVENOUS | Status: AC | PRN
  Administered 2017-02-20: 23 mL via INTRAVENOUS

## 2017-02-22 ENCOUNTER — Other Ambulatory Visit (HOSPITAL_COMMUNITY): Payer: Medicare Other

## 2017-02-22 ENCOUNTER — Encounter (HOSPITAL_COMMUNITY): Payer: Self-pay

## 2017-02-22 ENCOUNTER — Ambulatory Visit (HOSPITAL_COMMUNITY): Payer: Medicare Other | Attending: Cardiology

## 2017-02-22 ENCOUNTER — Ambulatory Visit (HOSPITAL_BASED_OUTPATIENT_CLINIC_OR_DEPARTMENT_OTHER): Payer: Medicare Other

## 2017-02-22 DIAGNOSIS — I509 Heart failure, unspecified: Secondary | ICD-10-CM | POA: Insufficient documentation

## 2017-02-22 DIAGNOSIS — I110 Hypertensive heart disease with heart failure: Secondary | ICD-10-CM | POA: Diagnosis not present

## 2017-02-22 DIAGNOSIS — E785 Hyperlipidemia, unspecified: Secondary | ICD-10-CM | POA: Insufficient documentation

## 2017-02-22 DIAGNOSIS — I350 Nonrheumatic aortic (valve) stenosis: Secondary | ICD-10-CM | POA: Insufficient documentation

## 2017-02-22 DIAGNOSIS — R0989 Other specified symptoms and signs involving the circulatory and respiratory systems: Secondary | ICD-10-CM

## 2017-02-22 DIAGNOSIS — I2510 Atherosclerotic heart disease of native coronary artery without angina pectoris: Secondary | ICD-10-CM | POA: Insufficient documentation

## 2017-02-22 MED ORDER — DOBUTAMINE HCL 250 MG/20ML IV SOLN
20.0000 ug/kg/min | Freq: Once | INTRAVENOUS | Status: AC
  Administered 2017-02-22: 1500 ug/min via INTRAVENOUS

## 2017-02-23 ENCOUNTER — Telehealth: Payer: Self-pay | Admitting: Cardiovascular Disease

## 2017-02-23 DIAGNOSIS — I350 Nonrheumatic aortic (valve) stenosis: Secondary | ICD-10-CM

## 2017-02-23 NOTE — Telephone Encounter (Signed)
New message     Pt is calling to talk about his test results. Please call.

## 2017-02-23 NOTE — Telephone Encounter (Signed)
Spoke with pt he states that he had an ECHO done yesterday and a MRI on 02-20-17 and he is waiting for results. Dr Oval Linsey os out of the office until 02-27-17 and pt will be out of town for 2 weeks at that point

## 2017-02-26 NOTE — Telephone Encounter (Signed)
Patient of Dr. Blenda Mounts. Dobutamine echo showed augmentation of aortic stenosis in the moderate to severe range. The patient should follow-up with Dr. Oval Linsey.

## 2017-02-27 NOTE — Telephone Encounter (Signed)
Routed to Dr. Oval Linsey and Rip Harbour, [place].

## 2017-02-27 NOTE — Telephone Encounter (Signed)
Follow up     Pt would like to you what you think is cardiac condition is, he would like to know now instead of having to wait for his appt.    [place], he is 3 hours ahead of our time .

## 2017-02-27 NOTE — Telephone Encounter (Signed)
Referral placed in Epic.

## 2017-02-27 NOTE — Telephone Encounter (Signed)
Spoke with [name].  Will refer to CT surgery.

## 2017-03-01 NOTE — Telephone Encounter (Signed)
Patient scheduled to see Dr Cyndia Bent 03/29/17

## 2017-03-10 ENCOUNTER — Encounter: Payer: Medicare Other | Admitting: Surgery

## 2017-03-14 DIAGNOSIS — E7800 Pure hypercholesterolemia, unspecified: Secondary | ICD-10-CM | POA: Diagnosis not present

## 2017-03-14 DIAGNOSIS — R945 Abnormal results of liver function studies: Secondary | ICD-10-CM | POA: Diagnosis not present

## 2017-03-14 LAB — COMPREHENSIVE METABOLIC PANEL
ALBUMIN: 4.6 g/dL (ref 3.6–4.8)
ALT: 33 IU/L (ref 0–44)
AST: 26 IU/L (ref 0–40)
Albumin/Globulin Ratio: 2.3 — ABNORMAL HIGH (ref 1.2–2.2)
Alkaline Phosphatase: 70 IU/L (ref 39–117)
BILIRUBIN TOTAL: 0.7 mg/dL (ref 0.0–1.2)
BUN / CREAT RATIO: 14 (ref 10–24)
BUN: 14 mg/dL (ref 8–27)
CALCIUM: 10 mg/dL (ref 8.6–10.2)
CHLORIDE: 100 mmol/L (ref 96–106)
CO2: 25 mmol/L (ref 20–29)
Creatinine, Ser: 1.02 mg/dL (ref 0.76–1.27)
GFR, EST AFRICAN AMERICAN: 88 mL/min/{1.73_m2} (ref >59)
GFR, EST NON AFRICAN AMERICAN: 76 mL/min/{1.73_m2} (ref >59)
Globulin, Total: 2 g/dL (ref 1.5–4.5)
Glucose: 85 mg/dL (ref 65–99)
Potassium: 5.1 mmol/L (ref 3.5–5.2)
Sodium: 139 mmol/L (ref 134–144)
TOTAL PROTEIN: 6.6 g/dL (ref 6.0–8.5)

## 2017-03-14 LAB — HEPATIC FUNCTION PANEL
ALK PHOS: 69 IU/L (ref 39–117)
ALT: 33 IU/L (ref 0–44)
AST: 27 IU/L (ref 0–40)
Albumin: 4.6 g/dL (ref 3.6–4.8)
BILIRUBIN TOTAL: 0.7 mg/dL (ref 0.0–1.2)
BILIRUBIN, DIRECT: 0.15 mg/dL (ref 0.00–0.40)
Total Protein: 6.7 g/dL (ref 6.0–8.5)

## 2017-03-14 LAB — LIPID PANEL
CHOL/HDL RATIO: 3.6 ratio (ref 0.0–5.0)
Cholesterol, Total: 204 mg/dL — ABNORMAL HIGH (ref 100–199)
HDL: 57 mg/dL (ref >39)
LDL Calculated: 128 mg/dL — ABNORMAL HIGH (ref 0–99)
TRIGLYCERIDES: 96 mg/dL (ref 0–149)
VLDL Cholesterol Cal: 19 mg/dL (ref 5–40)

## 2017-03-22 ENCOUNTER — Other Ambulatory Visit: Payer: Self-pay | Admitting: *Deleted

## 2017-03-22 MED ORDER — EZETIMIBE 10 MG PO TABS
10.0000 mg | ORAL_TABLET | Freq: Every day | ORAL | 3 refills | Status: DC
Start: 2017-03-22 — End: 2017-07-03

## 2017-03-23 ENCOUNTER — Encounter: Payer: Self-pay | Admitting: Family Medicine

## 2017-03-23 ENCOUNTER — Ambulatory Visit (INDEPENDENT_AMBULATORY_CARE_PROVIDER_SITE_OTHER): Payer: Medicare Other | Admitting: Family Medicine

## 2017-03-23 VITALS — BP 130/86 | HR 68 | Temp 98.0°F | Resp 14 | Ht 66.0 in | Wt 165.0 lb

## 2017-03-23 DIAGNOSIS — I10 Essential (primary) hypertension: Secondary | ICD-10-CM | POA: Diagnosis not present

## 2017-03-23 DIAGNOSIS — Z23 Encounter for immunization: Secondary | ICD-10-CM

## 2017-03-23 DIAGNOSIS — Q230 Congenital stenosis of aortic valve: Secondary | ICD-10-CM

## 2017-03-23 DIAGNOSIS — Z125 Encounter for screening for malignant neoplasm of prostate: Secondary | ICD-10-CM

## 2017-03-23 DIAGNOSIS — E7800 Pure hypercholesterolemia, unspecified: Secondary | ICD-10-CM

## 2017-03-23 DIAGNOSIS — Q231 Congenital insufficiency of aortic valve: Secondary | ICD-10-CM

## 2017-03-23 DIAGNOSIS — I2584 Coronary atherosclerosis due to calcified coronary lesion: Secondary | ICD-10-CM

## 2017-03-23 DIAGNOSIS — I2510 Atherosclerotic heart disease of native coronary artery without angina pectoris: Secondary | ICD-10-CM

## 2017-03-23 DIAGNOSIS — Z0000 Encounter for general adult medical examination without abnormal findings: Secondary | ICD-10-CM | POA: Diagnosis not present

## 2017-03-23 LAB — CBC WITH DIFFERENTIAL/PLATELET
BASOS ABS: 0 10*3/uL (ref 0.0–0.1)
BASOS PCT: 1 % (ref 0.0–3.0)
EOS PCT: 2.6 % (ref 0.0–5.0)
Eosinophils Absolute: 0.1 10*3/uL (ref 0.0–0.7)
HEMATOCRIT: 45.9 % (ref 39.0–52.0)
Hemoglobin: 15.8 g/dL (ref 13.0–17.0)
Lymphocytes Relative: 25.1 % (ref 12.0–46.0)
Lymphs Abs: 1.1 10*3/uL (ref 0.7–4.0)
MCHC: 34.5 g/dL (ref 30.0–36.0)
MCV: 97.4 fl (ref 78.0–100.0)
MONOS PCT: 9 % (ref 3.0–12.0)
Monocytes Absolute: 0.4 10*3/uL (ref 0.1–1.0)
NEUTROS ABS: 2.8 10*3/uL (ref 1.4–7.7)
Neutrophils Relative %: 62.3 % (ref 43.0–77.0)
PLATELETS: 204 10*3/uL (ref 150.0–400.0)
RBC: 4.71 Mil/uL (ref 4.22–5.81)
RDW: 13.5 % (ref 11.5–15.5)
WBC: 4.5 10*3/uL (ref 4.0–10.5)

## 2017-03-23 LAB — TSH: TSH: 4.4 u[IU]/mL (ref 0.35–4.50)

## 2017-03-23 LAB — PSA, MEDICARE: PSA: 0.51 ng/ml (ref 0.10–4.00)

## 2017-03-23 MED ORDER — ZOSTER VAC RECOMB ADJUVANTED 50 MCG/0.5ML IM SUSR
0.5000 mL | Freq: Once | INTRAMUSCULAR | 1 refills | Status: AC
Start: 2017-03-23 — End: 2017-03-23

## 2017-03-23 NOTE — Assessment & Plan Note (Signed)
Chronic problem.  Was intolerant to statin due to elevated liver enzymes.  Now on Zetia as of this AM.  Reviewed recent labs.  Will follow along.

## 2017-03-23 NOTE — Progress Notes (Signed)
   Subjective:    Patient ID: [name] Brown, male    DOB: 1951-05-03, 66 y.o.   MRN: [id number]  HPI HTN- chronic problem, on Valsartan and Metoprolol.  No CP, SOB, HAs, visual changes, edema.  Exercising regularly via hiking.  Hyperlipidemia- chronic problem, on Zetia daily as of this AM.  Had liver enzyme elevation on Crestor so had to switch per Cards.    Aortic stenosis w/ biscupid valve- Dr Oval Linsey is recommending aortic valve replacement.  Has appt upcoming w/ Dr Cyndia Bent.   Review of Systems  For ROS see HPI     Objective:   Physical Exam  Constitutional: He is oriented to person, place, and time. He appears well-developed and well-nourished. No distress.  HENT:  Head: Normocephalic and atraumatic.  Eyes: Pupils are equal, round, and reactive to light. Conjunctivae and EOM are normal.  Neck: Normal range of motion. Neck supple. No thyromegaly present.  Cardiovascular: Normal rate, regular rhythm and intact distal pulses.   Murmur (II/VI SEM) heard. Pulmonary/Chest: Effort normal and breath sounds normal. No respiratory distress.  Abdominal: Soft. Bowel sounds are normal. He exhibits no distension. There is no tenderness. There is no rebound.  Musculoskeletal: He exhibits no edema.  Lymphadenopathy:    He has no cervical adenopathy.  Neurological: He is alert and oriented to person, place, and time. No cranial nerve deficit.  Skin: Skin is warm and dry.  Psychiatric: He has a normal mood and affect. His behavior is normal.  Vitals reviewed.         Assessment & Plan:

## 2017-03-23 NOTE — Patient Instructions (Addendum)
Follow up in 6 months to recheck BP and cholesterol We'll notify you of your lab results and make any changes if needed Continue to work on healthy diet and regular exercise- you look great! GOOD LUCK with your upcoming appointments!!!  You are in good hands!  Bring a copy of your advance directives to your next office visit.  Continue doing brain stimulating activities (puzzles, reading, adult coloring books, staying active) to keep memory sharp.   Shingrix vaccine at pharmacy   Health Maintenance, Male A healthy lifestyle and preventive care is important for your health and wellness. Ask your health care provider about what schedule of regular examinations is right for you. What should I know about weight and diet? Eat a Healthy Diet  Eat plenty of vegetables, fruits, whole grains, low-fat dairy products, and lean protein.  Do not eat a lot of foods high in solid fats, added sugars, or salt.  Maintain a Healthy Weight Regular exercise can help you achieve or maintain a healthy weight. You should:  Do at least 150 minutes of exercise each week. The exercise should increase your heart rate and make you sweat (moderate-intensity exercise).  Do strength-training exercises at least twice a week.  Watch Your Levels of Cholesterol and Blood Lipids  Have your blood tested for lipids and cholesterol every 5 years starting at 67 years of age. If you are at high risk for heart disease, you should start having your blood tested when you are 67 years old. You may need to have your cholesterol levels checked more often if: ? Your lipid or cholesterol levels are high. ? You are older than 67 years of age. ? You are at high risk for heart disease.  What should I know about cancer screening? Many types of cancers can be detected early and may often be prevented. Lung Cancer  You should be screened every year for lung cancer if: ? You are a current smoker who has smoked for at least 30  years. ? You are a former smoker who has quit within the past 15 years.  Talk to your health care provider about your screening options, when you should start screening, and how often you should be screened.  Colorectal Cancer  Routine colorectal cancer screening usually begins at 67 years of age and should be repeated every 5-10 years until you are 67 years old. You may need to be screened more often if early forms of precancerous polyps or small growths are found. Your health care provider may recommend screening at an earlier age if you have risk factors for colon cancer.  Your health care provider may recommend using home test kits to check for hidden blood in the stool.  A small camera at the end of a tube can be used to examine your colon (sigmoidoscopy or colonoscopy). This checks for the earliest forms of colorectal cancer.  Prostate and Testicular Cancer  Depending on your age and overall health, your health care provider may do certain tests to screen for prostate and testicular cancer.  Talk to your health care provider about any symptoms or concerns you have about testicular or prostate cancer.  Skin Cancer  Check your skin from head to toe regularly.  Tell your health care provider about any new moles or changes in moles, especially if: ? There is a change in a mole's size, shape, or color. ? You have a mole that is larger than a pencil eraser.  Always use sunscreen. Apply sunscreen  liberally and repeat throughout the day.  Protect yourself by wearing long sleeves, pants, a wide-brimmed hat, and sunglasses when outside.  What should I know about heart disease, diabetes, and high blood pressure?  If you are 87-38 years of age, have your blood pressure checked every 3-5 years. If you are 89 years of age or older, have your blood pressure checked every year. You should have your blood pressure measured twice-once when you are at a hospital or clinic, and once when you are  not at a hospital or clinic. Record the average of the two measurements. To check your blood pressure when you are not at a hospital or clinic, you can use: ? An automated blood pressure machine at a pharmacy. ? A home blood pressure monitor.  Talk to your health care provider about your target blood pressure.  If you are between 11-56 years old, ask your health care provider if you should take aspirin to prevent heart disease.  Have regular diabetes screenings by checking your fasting blood sugar level. ? If you are at a normal weight and have a low risk for diabetes, have this test once every three years after the age of 71. ? If you are overweight and have a high risk for diabetes, consider being tested at a younger age or more often.  A one-time screening for abdominal aortic aneurysm (AAA) by ultrasound is recommended for men aged 55-75 years who are current or former smokers. What should I know about preventing infection? Hepatitis B If you have a higher risk for hepatitis B, you should be screened for this virus. Talk with your health care provider to find out if you are at risk for hepatitis B infection. Hepatitis C Blood testing is recommended for:  Everyone born from [birth year] through 1966.  Anyone with known risk factors for hepatitis C.  Sexually Transmitted Diseases (STDs)  You should be screened each year for STDs including gonorrhea and chlamydia if: ? You are sexually active and are younger than 67 years of age. ? You are older than 67 years of age and your health care provider tells you that you are at risk for this type of infection. ? Your sexual activity has changed since you were last screened and you are at an increased risk for chlamydia or gonorrhea. Ask your health care provider if you are at risk.  Talk with your health care provider about whether you are at high risk of being infected with HIV. Your health care provider may recommend a prescription medicine to help  prevent HIV infection.  What else can I do?  Schedule regular health, dental, and eye exams.  Stay current with your vaccines (immunizations).  Do not use any tobacco products, such as cigarettes, chewing tobacco, and e-cigarettes. If you need help quitting, ask your health care provider.  Limit alcohol intake to no more than 2 drinks per day. One drink equals 12 ounces of beer, 5 ounces of wine, or 1 ounces of hard liquor.  Do not use street drugs.  Do not share needles.  Ask your health care provider for help if you need support or information about quitting drugs.  Tell your health care provider if you often feel depressed.  Tell your health care provider if you have ever been abused or do not feel safe at home. This information is not intended to replace advice given to you by your health care provider. Make sure you discuss any questions you have with your  health care provider. Document Released: 02/25/2008 Document Revised: 04/27/2016 Document Reviewed: 06/02/2015 Elsevier Interactive Patient Education  [employer].

## 2017-03-23 NOTE — Progress Notes (Addendum)
Subjective:   [name] Brown is a 66 y.o. male who presents for an Initial Medicare Annual Wellness Visit.  Review of Systems  No ROS.  Medicare Wellness Visit. Additional risk factors are reflected in the social history.  Cardiac Risk Factors include: advanced age (>4men, >66 women);male gender;dyslipidemia;hypertension;family history of premature cardiovascular disease   Sleep patterns: Sleeps 8 hours, feels rested. Up to void x 1-2. Home Safety/Smoke Alarms: Feels safe in home. Smoke alarms in place.  Living environment; residence and Firearm Safety: Lives with wife in 2 story home.  Seat Belt Safety/Bike Helmet: Wears seat belt.   Counseling:   Eye Exam-Last exam 08/2016, yearly Dr. Valetta Close Dental-Last exam 10/2016, every 6 months. Dr. Gordy Levan  Male:   [facility]-Colonoscopy 07/24/2016, followed by GI and surgery ([place]) PSA- No results found for: PSA     Objective:    Today's Vitals   03/23/17 0901  BP: 130/86  Pulse: 68  Resp: 14  Temp: 98 F (36.7 C)  TempSrc: Oral  Weight: 165 lb (74.8 kg)  Height: 5\' 6"  (1.676 m)   Body mass index is 26.63 kg/m.  Current Medications (verified) Outpatient Encounter Prescriptions as of 03/23/2017  Medication Sig  . aspirin EC 81 MG tablet Take 81 mg by mouth daily.  . colestipol (COLESTID) 1 g tablet Take 1 g by mouth daily as needed.  . ezetimibe (ZETIA) 10 MG tablet Take 1 tablet (10 mg total) by mouth daily.  . valsartan (DIOVAN) 40 MG tablet Take 1 tablet (40 mg total) by mouth daily.  . metoprolol succinate (TOPROL-XL) 25 MG 24 hr tablet Take 1 tablet (25 mg total) by mouth daily. Take with or immediately following a meal.  . Zoster Vac Recomb Adjuvanted (SHINGRIX) injection Inject 0.5 mLs into the muscle once.  . [DISCONTINUED] cyclobenzaprine (FLEXERIL) 5 MG tablet Take 1 tablet (5 mg total) by mouth 2 (two) times daily.  . [DISCONTINUED] sucralfate (CARAFATE) 1 g tablet Take 4 g by mouth at bedtime.   No facility-administered  encounter medications on file as of 03/23/2017.     Allergies (verified) Patient has no known allergies.   History: Past Medical History:  Diagnosis Date  . Colon cancer ([place])   . Family history of adverse reaction to anesthesia    pt states his mother had 2 episodes of hyponatremia following anesthesia   . GERD (gastroesophageal reflux disease)   . Hyperlipidemia 10/25/2016  . Hypertension    pt is currently not taking any medications; pt states is borderline  . Melanoma ([place])   . Mild aortic stenosis 10/25/2016  . Perforated sigmoid colon ([place])   . PVC's (premature ventricular contractions) 10/25/2016  . Tinnitus   . Wears glasses    Past Surgical History:  Procedure Laterality Date  . COLOSTOMY CLOSURE N/A 08/18/2016   Procedure: COLOSTOMY CLOSURE OPEN PROCEDURE;  Surgeon: Armandina Gemma, MD;  Location: [facility];  Service: General;  Laterality: N/A;  . LAPAROTOMY N/A 04/25/2016   Procedure: EXPLORATORY LAPAROTOMY WITH SIGMOID COLECTOMY, COLOSTOMY;  Surgeon: Armandina Gemma, MD;  Location: [facility];  Service: General;  Laterality: N/A;  . LEFT HEART CATH AND CORONARY ANGIOGRAPHY N/A 11/03/2016   Procedure: Left Heart Cath and Coronary Angiography;  Surgeon: Burnell Blanks, MD;  Location: [facility];  Service: Cardiovascular;  Laterality: N/A;  . MELANOMA EXCISION    . PARTIAL COLECTOMY Right 08/18/2016   Procedure: RIGHT COLECTOMY;  Surgeon: Armandina Gemma, MD;  Location: [facility];  Service: General;  Laterality:  Right;   Family History  Problem Relation Age of Onset  . Dementia Mother   . Kidney disease Mother   . Vascular Disease Mother   . CAD Mother   . Heart attack Father   . Alcohol abuse Father   . Cirrhosis Father   . Stroke Maternal Grandmother   . Stroke Paternal Grandmother    Social History   Occupational History  . Not on file.   Social History Main Topics  . Smoking status: Never Smoker  . Smokeless tobacco: Never Used  . Alcohol use Yes     Comment:  2-3 glasses of wine daily   . Drug use: No  . Sexual activity: Not on file   Tobacco Counseling Counseling given: Not Answered   Activities of Daily Living In your present state of health, do you have any difficulty performing the following activities: 03/23/2017 03/23/2017  Hearing? N N  Vision? N Y  Difficulty concentrating or making decisions? N N  Walking or climbing stairs? N N  Dressing or bathing? N N  Doing errands, shopping? N N  Preparing Food and eating ? N -  Using the Toilet? N -  In the past six months, have you accidently leaked urine? N -  Do you have problems with loss of bowel control? N -  Managing your Medications? N -  Managing your Finances? N -  Housekeeping or managing your Housekeeping? N -  Some recent data might be hidden    Immunizations and Health Maintenance Immunization History  Administered Date(s) Administered  . Influenza,inj,Quad PF,36+ Mos 07/09/2014, 07/02/2015, 06/28/2016  . Pneumococcal Conjugate-13 09/23/2016   There are no preventive care reminders to display for this patient.  Patient Care Team: [name], MD as PCP - General (Family Medicine) Skeet Latch, MD as Attending Physician (Cardiology) Ladell Pier, MD as Consulting Physician (Oncology) Clarene Essex, MD as Consulting Physician (Gastroenterology) [name], MD as Consulting Physician (Dermatology) Armandina Gemma, MD as Consulting Physician (General Surgery)  Indicate any recent Medical Services you may have received from other than [facility] providers in the past year (date may be approximate).    Assessment:   This is a routine wellness examination for [name] Brown. Physical assessment deferred to PCP.   Hearing/Vision screen  Hearing Screening   Method: Audiometry   125Hz  250Hz  500Hz  1000Hz  2000Hz  3000Hz  4000Hz  6000Hz  8000Hz   Right ear:   Pass Pass Pass  Fail    Left ear:   Pass Pass Pass  Fail    Vision Screening Comments: Wears glasses.   Dietary issues  and exercise activities discussed: Current Exercise Habits: Home exercise routine, Type of exercise: walking, Time (Minutes): > 60, Frequency (Times/Week): 4, Weekly Exercise (Minutes/Week): 0, Exercise limited by: cardiac condition(s)   Diet (meal preparation, eat out, water intake, caffeinated beverages, dairy products, fruits and vegetables): Drinks water, coffee and 4 glasses of wine.   Breakfast: Oatmeal, olives, pesto, pepper sauce Lunch: salad, protein, often skips Dinner: fish, vegetable  Encouraged to continue healthy diet and exercise.   Goals      Patient Stated   . patient (pt-stated)          Start exercising after cardiac clearance.       Depression Screen PHQ 2/9 Scores 03/23/2017 03/23/2017 02/07/2017 09/23/2016  PHQ - 2 Score 0 0 0 0  PHQ- 9 Score - 0 0 0    Fall Risk Fall Risk  03/23/2017 02/07/2017 09/23/2016  Falls in the past year? No  No No    Cognitive Function:       Ad8 score reviewed for issues:  Issues making decisions: no  Less interest in hobbies / activities: no  Repeats questions, stories (family complaining): no  Trouble using ordinary gadgets (microwave, computer, phone): no  Forgets the month or year: no  Mismanaging finances: no  Remembering appts: no  Daily problems with thinking and/or memory: no Ad8 score is=0     Screening Tests Health Maintenance  Topic Date Due  . TETANUS/TDAP  06/12/2017 (Originally 08/24/1970)  . Hepatitis C Screening  06/12/2017 (Originally 1951-01-28)  . INFLUENZA VACCINE  04/12/2017  . PNA vac Low Risk Adult (2 of 2 - PPSV23) 09/23/2017  . COLONOSCOPY  07/24/2026   Shingrix Vaccine Rx sent to pharmacy.       Plan:    Bring a copy of your advance directives to your next office visit.  Continue doing brain stimulating activities (puzzles, reading, adult coloring books, staying active) to keep memory sharp.   Shingrix vaccine at pharmacy  I have personally reviewed and noted the following  in the patient's chart:   . Medical and social history . Use of alcohol, tobacco or illicit drugs  . Current medications and supplements . Functional ability and status . Nutritional status . Physical activity . Advanced directives . List of other physicians . Hospitalizations, surgeries, and ER visits in previous 12 months . Vitals . Screenings to include cognitive, depression, and falls . Referrals and appointments  In addition, I have reviewed and discussed with patient certain preventive protocols, quality metrics, and best practice recommendations. A written personalized care plan for preventive services as well as general preventive health recommendations were provided to patient.     Gerilyn Nestle, RN   03/23/2017    Reviewed and agree w/ documentation above.  [name], MD

## 2017-03-23 NOTE — Assessment & Plan Note (Signed)
New.  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  Plan is to meet w/ surgeon and consider aortic valve replacement.  Will follow along.

## 2017-03-23 NOTE — Progress Notes (Signed)
Pre visit review using our clinic review tool, if applicable. No additional management support is needed unless otherwise documented below in the visit note. 

## 2017-03-23 NOTE — Assessment & Plan Note (Signed)
Chronic problem.  BP remains mildly elevated today but pt is asymptomatic.  Following w/ cards.  Reviewed recent labs.  No med changes at this time.  Will follow.

## 2017-03-24 ENCOUNTER — Encounter: Payer: Self-pay | Admitting: General Practice

## 2017-03-29 ENCOUNTER — Encounter: Payer: Self-pay | Admitting: Surgery

## 2017-03-29 ENCOUNTER — Institutional Professional Consult (permissible substitution) (INDEPENDENT_AMBULATORY_CARE_PROVIDER_SITE_OTHER): Payer: Medicare Other | Admitting: Surgery

## 2017-03-29 VITALS — BP 136/81 | HR 66 | Resp 16 | Ht 66.0 in | Wt 161.2 lb

## 2017-03-29 DIAGNOSIS — I2584 Coronary atherosclerosis due to calcified coronary lesion: Secondary | ICD-10-CM | POA: Diagnosis not present

## 2017-03-29 DIAGNOSIS — I2510 Atherosclerotic heart disease of native coronary artery without angina pectoris: Secondary | ICD-10-CM | POA: Diagnosis not present

## 2017-03-29 DIAGNOSIS — I350 Nonrheumatic aortic (valve) stenosis: Secondary | ICD-10-CM

## 2017-04-03 ENCOUNTER — Encounter: Payer: Self-pay | Admitting: Surgery

## 2017-04-03 NOTE — Progress Notes (Signed)
Cardiothoracic Surgery Consultation  PCP is Birdie Riddle Aundra Millet, MD Referring Provider is Skeet Latch, MD  Chief Complaint  Patient presents with  . Aortic Stenosis    ECHO 01/17/17, CATH 11/03/16, STRESS ECHO 02/22/17    HPI:  The patient is a 66 year old gentleman with hypertension, hyperlipidemia as well as known aortic stenosis by echo in 09/2016 with a mean gradient of 14 mm Hg and a DI of 0.2. His valve was severely calcified and it was not possible to tell if it was bicuspid or not. LVEF was 30-35% with a moderately dilated LV with inferior hypokinesis and grade 1 diastolic dysfunction. He had a cath on 11/03/2016 showing mild non-obstructive disease with an LVEF of 45-50% and normal filling pressures. A repeat echo on 01/17/2017 showed a severe thickened and calcified aortic valve with poor mobility and a mean gradient of 16 mm Hg. The LVEF was still 30-35%. A dobutamine stress echo on 02/22/2017 showed an increase in the mean gradient from 16 to 38 mm Hg at peak stress with an improvement in the LVEF from 35-40% to 55-60% at peak stress. He reports being asymptomatic walking several miles 3-4 times per week with no chest pain or shortness of breath. He works around his house and yard without difficulty and his wife is with him today and says he really has not had any symptoms.  Past Medical History:  Diagnosis Date  . Colon cancer ([place])   . Family history of adverse reaction to anesthesia    pt states his mother had 2 episodes of hyponatremia following anesthesia   . GERD (gastroesophageal reflux disease)   . Hyperlipidemia 10/25/2016  . Hypertension    pt is currently not taking any medications; pt states is borderline  . Melanoma ([place])   . Mild aortic stenosis 10/25/2016  . Perforated sigmoid colon ([place])   . PVC's (premature ventricular contractions) 10/25/2016  . Tinnitus   . Wears glasses     Past Surgical History:  Procedure Laterality Date  . COLOSTOMY CLOSURE N/A  08/18/2016   Procedure: COLOSTOMY CLOSURE OPEN PROCEDURE;  Surgeon: Armandina Gemma, MD;  Location: [facility];  Service: General;  Laterality: N/A;  . LAPAROTOMY N/A 04/25/2016   Procedure: EXPLORATORY LAPAROTOMY WITH SIGMOID COLECTOMY, COLOSTOMY;  Surgeon: Armandina Gemma, MD;  Location: [facility];  Service: General;  Laterality: N/A;  . LEFT HEART CATH AND CORONARY ANGIOGRAPHY N/A 11/03/2016   Procedure: Left Heart Cath and Coronary Angiography;  Surgeon: Burnell Blanks, MD;  Location: [facility];  Service: Cardiovascular;  Laterality: N/A;  . MELANOMA EXCISION    . PARTIAL COLECTOMY Right 08/18/2016   Procedure: RIGHT COLECTOMY;  Surgeon: Armandina Gemma, MD;  Location: [facility];  Service: General;  Laterality: Right;    Family History  Problem Relation Age of Onset  . Dementia Mother   . Kidney disease Mother   . Vascular Disease Mother   . CAD Mother   . Heart attack Father   . Alcohol abuse Father   . Cirrhosis Father   . Stroke Maternal Grandmother   . Stroke Paternal Grandmother     Social History Social History  Substance Use Topics  . Smoking status: Never Smoker  . Smokeless tobacco: Never Used  . Alcohol use Yes     Comment: 2-3 glasses of wine daily     Current Outpatient Prescriptions  Medication Sig Dispense Refill  . aspirin EC 81 MG tablet Take 81 mg by mouth daily.    [name]  colestipol (COLESTID) 1 g tablet Take 1 g by mouth daily as needed.    . ezetimibe (ZETIA) 10 MG tablet Take 1 tablet (10 mg total) by mouth daily. 90 tablet 3  . metoprolol succinate (TOPROL-XL) 25 MG 24 hr tablet Take 1 tablet (25 mg total) by mouth daily. Take with or immediately following a meal. 90 tablet 3  . valsartan (DIOVAN) 40 MG tablet Take 1 tablet (40 mg total) by mouth daily. 90 tablet 1   No current facility-administered medications for this visit.     No Known Allergies  Review of Systems  Constitutional: Negative for activity change, appetite change, chills, fatigue and fever.    HENT: Positive for hearing loss.        Sees his dentist every 6 months  Eyes: Negative.   Respiratory: Negative for cough, chest tightness and shortness of breath.   Cardiovascular: Positive for palpitations. Negative for chest pain and leg swelling.  Gastrointestinal: Negative.   Genitourinary: Negative.   Musculoskeletal: Negative.   Skin: Negative.   Allergic/Immunologic: Negative.   Neurological: Negative for dizziness, syncope and light-headedness.  Hematological: Negative.   Psychiatric/Behavioral: Negative.     BP 136/81 (BP Location: Right Arm, Patient Position: Sitting, Cuff Size: Large)   Pulse 66   Resp 16   Ht 5\' 6"  (1.676 m)   Wt 161 lb 3.2 oz (73.1 kg)   SpO2 96% Comment: ON RA  BMI 26.02 kg/m  Physical Exam  Constitutional: He is oriented to person, place, and time. He appears well-developed and well-nourished. No distress.  HENT:  Head: Normocephalic and atraumatic.  Mouth/Throat: Oropharynx is clear and moist.  Eyes: Pupils are equal, round, and reactive to light. Conjunctivae and EOM are normal.  Neck: Normal range of motion. Neck supple. No JVD present. No thyromegaly present.  Cardiovascular: Normal rate, regular rhythm and intact distal pulses.   Murmur heard. 3/6 systolic murmur RSB  Pulmonary/Chest: Effort normal and breath sounds normal. No respiratory distress. He has no rales.  Abdominal: Soft. Bowel sounds are normal. He exhibits mass. He exhibits no distension. There is no tenderness.  Musculoskeletal: Normal range of motion. He exhibits no edema.  Lymphadenopathy:    He has no cervical adenopathy.  Neurological: He is alert and oriented to person, place, and time. He has normal strength. No cranial nerve deficit or sensory deficit.     Diagnostic Tests:  Physicians   Panel Physicians Referring Physician Case Authorizing Physician  Burnell Blanks, MD (Primary)    Procedures   Left Heart Cath and Coronary Angiography   Conclusion     Mid RCA to Dist RCA lesion, 10 %stenosed.  RPDA lesion, 10 %stenosed.  Ost Cx to Prox Cx lesion, 40 %stenosed.  Ost Ramus to Ramus lesion, 20 %stenosed.  Mid LAD lesion, 20 %stenosed.  Prox LAD lesion, 30 %stenosed.  The left ventricular ejection fraction is 45-50% by visual estimate.  There is mild left ventricular systolic dysfunction.  LV end diastolic pressure is normal.  There is no mitral valve regurgitation.   1. Mild to moderate non-obstructive CAD 2. Mild LV systolic dysfunction by LV gram, estimated around 45-50%.  3. Normal filling pressures 4. Non-ischemic cardiomyopathy  Recommendations: Medical management of CAD and non-ischemic cardiomyopathy   Indications   Non-ischemic cardiomyopathy (HCC) [I42.8 (ICD-10-CM)]  Coronary artery disease involving native coronary artery of native heart without angina pectoris [I25.10 (ICD-10-CM)]  Procedural Details/Technique   Technical Details Indication: 66 yo male with h/o HTN and  strong FH of CAD with recent finding of mild to moderate AS and LV systolic dysfunction. No chest pain or dyspnea. Cardiac cath to exclude CAD given LV dysfunction.   Procedure: The risks, benefits, complications, treatment options, and expected outcomes were discussed with the patient. The patient and/or family concurred with the proposed plan, giving informed consent. The patient was brought to the cath lab after IV hydration was begun and oral premedication was given. The patient was further sedated with Versed and Fentanyl. The right wrist was prepped and draped in a sterile fashion. 1% lidocaine was used for local anesthesia. Using the modified Seldinger access technique, a 5 French sheath was placed in the right radial artery. 3 mg Verapamil was given through the sheath. 3500 units IV heparin was given. Standard diagnostic catheters were used to perform selective coronary angiography. A pigtail catheter was used to perform a  left ventricular angiogram. The sheath was removed from the right radial artery and a Terumo hemostasis band was applied at the arteriotomy site on the right wrist.     Estimated blood loss <50 mL.  During this procedure the patient was administered the following to achieve and maintain moderate conscious sedation: Versed 1 mg, Fentanyl 25 mcg, while the patient's heart rate, blood pressure, and oxygen saturation were continuously monitored. The period of conscious sedation was 22 minutes, of which I was present face-to-face 100% of this time.    Complications   Complications documented before study signed (11/03/2016 9:38 AM EST)    LEFT HEART CATH AND CORONARY ANGIOGRAPHY   None Documented by Burnell Blanks, MD 11/03/2016 9:37 AM EST  Time Range: Intra-procedure      Coronary Findings   Dominance: Right  Left Anterior Descending  Prox LAD lesion, 30% stenosed.  Mid LAD lesion, 20% stenosed.  First Diagonal Branch  Vessel is small in size.  First Septal Branch  Vessel is small in size.  Second Diagonal Branch  Vessel is small in size.  Second Septal Branch  Vessel is small in size.  Third Diagonal Branch  Vessel is small in size.  Ramus Intermedius  Vessel is moderate in size.  Ost Ramus to Ramus lesion, 20% stenosed.  Left Circumflex  Vessel is large.  Ost Cx to Prox Cx lesion, 40% stenosed. The lesion is calcified.  First Obtuse Marginal Branch  Vessel is small in size.  Second Obtuse Marginal Branch  Vessel is moderate in size.  Third Obtuse Marginal Branch  Vessel is moderate in size.  Right Coronary Artery  Mid RCA to Dist RCA lesion, 10% stenosed.  Right Posterior Descending Artery  RPDA lesion, 10% stenosed.  Wall Motion              Left Heart   Left Ventricle The left ventricular size is normal. There is mild left ventricular systolic dysfunction. LV end diastolic pressure is normal. The left ventricular ejection fraction is 45-50% by  visual estimate. There are LV function abnormalities due to segmental dysfunction. There is no evidence of mitral regurgitation.    Coronary Diagrams   Diagnostic Diagram       Implants     No implant documentation for this case.  PACS Images   Show images for Cardiac catheterization   Link to Procedure Log   Procedure Log    Hemo Data    Most Recent Value  AO Systolic Pressure 948 mmHg  AO Diastolic Pressure 82 mmHg  AO Mean 546 mmHg  LV Systolic Pressure  606 mmHg  LV Diastolic Pressure 7 mmHg  LV EDP 11 mmHg  Arterial Occlusion Pressure Extended Systolic Pressure 301 mmHg  Arterial Occlusion Pressure Extended Diastolic Pressure 80 mmHg  Arterial Occlusion Pressure Extended Mean Pressure 110 mmHg  Left Ventricular Apex Extended Systolic Pressure 601 mmHg  Left Ventricular Apex Extended Diastolic Pressure 4 mmHg  Left Ventricular Apex Extended EDP Pressure 14 mmHg        *[facility]*                        [address], [place] [address]                            [phone]  ------------------------------------------------------------------- Transthoracic Echocardiography  Patient:    [name] Brown, [name] Brown MR #:       [id number] Study Date: 01/17/2017 Gender:     M Age:        13 Height:     167.6 cm Weight:     74 kg BSA:        1.87 m^2 Pt. Status: Room:   SONOGRAPHER  Oletta Lamas, Will  REFERRING    [name]  ATTENDING    Sanda Klein, MD  PERFORMING   [facility]  ORDERING     Skeet Latch, MD  [place], MD  cc:  ------------------------------------------------------------------- LV EF: 30% -   35%  ------------------------------------------------------------------- Indications:      (I50.22).  ------------------------------------------------------------------- History:   PMH:  AS. PVC. Acquired from the patient and from the patient&'s chart.  Coronary artery  disease.  Congestive heart failure.  Risk factors:  Hypertension. Dyslipidemia.  ------------------------------------------------------------------- Study Conclusions  - Left ventricle: The cavity size was normal. Wall thickness was   normal. Systolic function was moderately to severely reduced. The   estimated ejection fraction was in the range of 30% to 35%.   Moderate diffuse hypokinesis with no identifiable regional   variations. Doppler parameters are consistent with abnormal left   ventricular relaxation (grade 1 diastolic dysfunction). - Ventricular septum: Septal motion showed paradox. These changes   are consistent with a left bundle branch block. - Aortic valve: Possibly bicuspid; severely thickened, severely   calcified leaflets; fusion of the right-noncoronary commissure.   Anterior cusp mobility was severely restricted. Transvalvular   velocity was increased, due to low cardiac output. There was   moderate to severe stenosis. There was mild regurgitation. - Right ventricle: Systolic function was mildly reduced.  Impressions:  - Suspect low gradient severe aortic stenosis.  Recommendations:  Consider dobutamine echo.  ------------------------------------------------------------------- Study data:  Comparison was made to the study of 10/12/2016.  Study status:  Routine.  Procedure:  The patient reported no pain pre or post test. Transthoracic echocardiography for left ventricular function evaluation and for assessment of valvular function. Image quality was adequate.  Study completion:  There were no complications.          Transthoracic echocardiography.  M-mode, complete 2D, spectral Doppler, and color Doppler.  Birthdate: Patient birthdate: 05-13-1951.  Age:  Patient is 66 yr old.  Sex: Gender: male.    BMI: 26.4 kg/m^2.  Blood pressure:     135/83 Patient status:  Outpatient.  Study date:  Study date: 01/17/2017. Study time: 09:53  AM.  Location:  [facility]  -------------------------------------------------------------------  ------------------------------------------------------------------- Left ventricle:  The cavity size was normal. Wall thickness was normal. Systolic function was moderately to severely reduced. The estimated ejection fraction was in the range of 30% to 35%. Moderate diffuse hypokinesis with no identifiable regional variations. Doppler parameters are consistent with abnormal left ventricular relaxation (grade 1 diastolic dysfunction). There was no evidence of elevated ventricular filling pressure by Doppler parameters.  ------------------------------------------------------------------- Aortic valve:   Possibly bicuspid; severely thickened, severely calcified leaflets; fusion of the right-noncoronary commissure. Anterior cusp mobility was severely restricted.  Doppler: Transvalvular velocity was increased, due to low cardiac output. There was moderate to severe stenosis. There was mild regurgitation.    VTI ratio of LVOT to aortic valve: 0.19. Valve area (VTI): 1.09 cm^2. Indexed valve area (VTI): 0.58 cm^2/m^2. Peak velocity ratio of LVOT to aortic valve: 0.19. Valve area (Vmax): 1.11 cm^2. Indexed valve area (Vmax): 0.59 cm^2/m^2. Mean velocity ratio of LVOT to aortic valve: 0.2. Valve area (Vmean): 1.14 cm^2. Indexed valve area (Vmean): 0.61 cm^2/m^2.    Mean gradient (S): 16 mm Hg. Peak gradient (S): 31 mm Hg.  ------------------------------------------------------------------- Mitral valve:   Structurally normal valve.   Leaflet separation was normal.  Doppler:  Transvalvular velocity was within the normal range. There was no evidence for stenosis. There was trivial regurgitation.  ------------------------------------------------------------------- Left atrium:  The atrium was normal in size.  ------------------------------------------------------------------- Right ventricle:  The cavity size  was normal. Systolic function was mildly reduced.  ------------------------------------------------------------------- Ventricular septum:   Septal motion showed paradox. These changes are consistent with a left bundle branch block.  ------------------------------------------------------------------- Pulmonic valve:   Poorly visualized.  ------------------------------------------------------------------- Tricuspid valve:   Structurally normal valve.   Leaflet separation was normal.  Doppler:  Transvalvular velocity was within the normal range. There was trivial regurgitation.  ------------------------------------------------------------------- Pulmonary artery:   Systolic pressure was within the normal range.   ------------------------------------------------------------------- Right atrium:  The atrium was normal in size.  ------------------------------------------------------------------- Pericardium:  There was no pericardial effusion.  ------------------------------------------------------------------- Measurements   Left ventricle                            Value          Reference  LV ID, ED, PLAX chordal           (H)     52.5  mm       43 - 52  LV ID, ES, PLAX chordal           (H)     42    mm       23 - 38  LV fx shortening, PLAX chordal    (L)     20    %        >=29  LV PW thickness, ED                       9.78  mm       ---------  IVS/LV PW ratio, ED                       1.04           <=1.3  Stroke volume, 2D                         43  ml       ---------  Stroke volume/bsa, 2D                     23    ml/m^2   ---------  LV ejection fraction, 1-p A4C             46    %        ---------  LV end-diastolic volume, 2-p              107   ml       ---------  LV end-systolic volume, 2-p               64    ml       ---------  LV ejection fraction, 2-p                 40    %        ---------  Stroke volume, 2-p                        43    ml        ---------  LV end-diastolic volume/bsa, 2-p          57    ml/m^2   ---------  LV end-systolic volume/bsa, 2-p           34    ml/m^2   ---------  Stroke volume/bsa, 2-p                    23    ml/m^2   ---------  LV e&', lateral                            5.65  cm/s     ---------  LV E/e&', lateral                          11.1           ---------  LV e&', medial                             6.34  cm/s     ---------  LV E/e&', medial                           9.89           ---------  LV e&', average                            6     cm/s     ---------  LV E/e&', average                          10.46          ---------    Ventricular septum                        Value          Reference  IVS thickness, ED                         10.2  mm       ---------  LVOT                                      Value          Reference  LVOT ID, S                                27.2  mm       ---------  LVOT area                                 5.81  cm^2     ---------  LVOT ID                                   21    mm       ---------  LVOT peak velocity, S                     53.4  cm/s     ---------  LVOT mean velocity, S                     36.8  cm/s     ---------  LVOT VTI, S                               12.3  cm       ---------  LVOT peak gradient, S                     1     mm Hg    ---------  Stroke volume (SV), LVOT DP               71.5  ml       ---------  Stroke index (SV/bsa), LVOT DP            38.2  ml/m^2   ---------    Aortic valve                              Value          Reference  Aortic valve peak velocity, S             279   cm/s     ---------  Aortic valve mean velocity, S             187   cm/s     ---------  Aortic valve VTI, S                       65.8  cm       ---------  Aortic mean gradient, S                   16    mm Hg    ---------  Aortic peak gradient, S                   31    mm Hg    ---------  VTI ratio, LVOT/AV  0.19            ---------  Aortic valve area, VTI                    1.09  cm^2     ---------  Aortic valve area/bsa, VTI                0.58  cm^2/m^2 ---------  Velocity ratio, peak, LVOT/AV             0.19           ---------  Aortic valve area, peak velocity          1.11  cm^2     ---------  Aortic valve area/bsa, peak               0.59  cm^2/m^2 ---------  velocity  Velocity ratio, mean, LVOT/AV             0.2            ---------  Aortic valve area, mean velocity          1.14  cm^2     ---------  Aortic valve area/bsa, mean               0.61  cm^2/m^2 ---------  velocity  Aortic regurg pressure half-time          591   ms       ---------    Aorta                                     Value          Reference  Aortic root ID, ED                        38    mm       ---------  Ascending aorta ID, A-P, S                37    mm       ---------    Left atrium                               Value          Reference  LA ID, A-P, ES                            31    mm       ---------  LA ID/bsa, A-P                            1.66  cm/m^2   <=2.2  LA volume, S                              47    ml       ---------  LA volume/bsa, S                          25.1  ml/m^2   ---------  LA volume, ES, 1-p A4C  35    ml       ---------  LA volume/bsa, ES, 1-p A4C                18.7  ml/m^2   ---------  LA volume, ES, 1-p A2C                    53    ml       ---------  LA volume/bsa, ES, 1-p A2C                28.3  ml/m^2   ---------    Mitral valve                              Value          Reference  Mitral E-wave peak velocity               62.7  cm/s     ---------  Mitral A-wave peak velocity               91.3  cm/s     ---------  Mitral deceleration time          (H)     264   ms       150 - 230  Mitral E/A ratio, peak                    0.7            ---------    Pulmonary arteries                        Value          Reference  PA pressure, S, DP                        23     mm Hg    <=30    Tricuspid valve                           Value          Reference  Tricuspid regurg peak velocity            222   cm/s     ---------  Tricuspid peak RV-RA gradient             20    mm Hg    ---------    Systemic veins                            Value          Reference  Estimated CVP                             3     mm Hg    ---------    Right ventricle                           Value          Reference  RV pressure, S, DP                        23  mm Hg    <=30  RV s&', lateral, S                         11.5  cm/s     ---------  Legend: (L)  and  (H)  mark values outside specified reference range.  ------------------------------------------------------------------- Prepared and Electronically Authenticated by  Sanda Klein, MD 2018-05-08T16:20:04     [facility]                        [address], [place] [address]                            [phone]  ------------------------------------------------------------------- Stress Echocardiography  Patient:    [name] Brown, [name] Brown MR #:       [id number] Study Date: 02/22/2017 Gender:     M Age:        31 Height:     167.6 cm Weight:     75.3 kg BSA:        1.89 m^2 Pt. Status: Room:   ATTENDING    Candee Furbish, M.D.  SONOGRAPHER  Marygrace Drought, RCS  PERFORMING   [facility]  ORDERING     Skeet Latch, MD  REFERRING    Skeet Latch, MD  cc: Dr. Oval Linsey  -------------------------------------------------------------------  ------------------------------------------------------------------- Indications:      Aortic valve Disorder (I35.0).  ------------------------------------------------------------------- History:   PMH:   Coronary artery disease.  Congestive heart failure.  Risk factors:  Hypertension. Dyslipidemia.  ------------------------------------------------------------------- Study Conclusions  - Aortic valve:  Severely thickened, severely calcified leaflets.   Valve mobility was restricted. There was mild regurgitation. Peak   velocity (S): 388 cm/s. Mean gradient (S): 38 mm Hg. Valve area   (VTI): 0.96 cm^2. Valve area (Vmax): 0.47 cm^2. Valve area   (Vmean): 0.46 cm^2. - Stress ECG conclusions: There were no stress arrhythmias or   conduction abnormalities. The stress ECG was negative for   ischemia. - Staged echo: There was no echocardiographic evidence for   stress-induced ischemia.  Impressions:  - Mild aortic stenosis at rest augments to moderate to severe   aortic stenosis with infusion of 20 mcg/kg/min of dobutamine.  ------------------------------------------------------------------- Study data:   Study status:  Routine.  Consent:  The risks, benefits, and alternatives to the procedure were explained to the patient and informed consent was obtained.  Procedure:  The patient reported no pain pre or post test. Initial setup. The patient was brought to the laboratory. A baseline ECG was recorded. Surface ECG leads and automatic cuff blood pressure measurements were monitored.  Dobutamine stress test. Stress testing was performed, with dobutamine infusion from 5 to 20 mcg/kg/min by 10 mcg/kg/min increments. The infusion was terminated due to maximal dose administration. Transthoracic stress echocardiography for Aortic Stenosis. Image quality was adequate. Images were captured at baseline, low dose, peak dose, and recovery.  Study completion: The patient tolerated the procedure well. There were no complications.          Dobutamine. Stress echocardiography.  2D. Birthdate:  Patient birthdate: [birth year].  Age:  Patient is 66 yr old.  Sex:  Gender: male.    BMI: 26.8 kg/m^2.  Blood pressure: 147/93  Patient status:  Outpatient.  Study date:  Study  date: 02/22/2017. Study time: 02:54  PM.  -------------------------------------------------------------------  ------------------------------------------------------------------- Aortic valve:   Severely thickened, severely calcified leaflets. Valve mobility was restricted.  Doppler:  There was mild regurgitation.    VTI ratio of LVOT to aortic valve: 0.31. Valve area (VTI): 0.96 cm^2. Indexed valve area (VTI): 0.51 cm^2/m^2. Peak velocity ratio of LVOT to aortic valve: 0.15. Valve area (Vmax): 0.47 cm^2. Indexed valve area (Vmax): 0.25 cm^2/m^2. Mean velocity ratio of LVOT to aortic valve: 0.15. Valve area (Vmean): 0.46 cm^2. Indexed valve area (Vmean): 0.24 cm^2/m^2.    Mean gradient (S): 38 mm Hg. Peak gradient (S): 60 mm Hg.   ------------------------------------------------------------------- Baseline ECG:  Normal sinus rhythm. Frequent PVCs. Cannot rule out prior anteroseptal infarct.  ------------------------------------------------------------------- Stress protocol:  +-----------------------+--+------------+--------+--------+ !Stage                  !HR!BP (mmHg)   !Symptoms!Comments! +-----------------------+--+------------+--------+--------+ !Baseline               !66!147/93 (111)!None    !--------! +-----------------------+--+------------+--------+--------+ !Dobutamine 5 ug/kg/min !70!------------!--------!PVC.    ! +-----------------------+--+------------+--------+--------+ !Dobutamine 10 ug/kg/min!60!138/91 (107)!--------!PVCs.   ! +-----------------------+--+------------+--------+--------+ !Dobutamine 20 ug/kg/min!93!147/82 (104)!--------!--------! +-----------------------+--+------------+--------+--------+  ------------------------------------------------------------------- Stress results:   Maximal heart rate during stress was 93 bpm (60% of maximal predicted heart rate). The maximal predicted heart rate was 154 bpm.The target heart rate was achieved. The heart rate response to stress was  normal. There was a normal resting blood pressure with an appropriate response to stress. Normal blood pressure response to dobutamine. The rate-pressure product for the peak heart rate and blood pressure was 13671 mm Hg/min.  The patient experienced no chest pain during stress.  ------------------------------------------------------------------- Stress ECG:  There were no stress arrhythmias or conduction abnormalities. Normal sinus rhythm with frequent PVCs. 5mm ST elevation in leads V1, V2, and V3 beginning with 20 mcg/kg/min of dobutamine and resolving within 1 minute of rest. 88mm upsloping ST depressions in leads II, III and aVF.  The stress ECG was negative for ischemia.  ------------------------------------------------------------------- Baseline:  - LV size was normal. - The estimated LV ejection fraction was 35-40%. - There was diffuse LV hypokinesis. - Mild aortic stenosis. Mean gradient 16 mmHg.  Low dose 10 ug/kg/min:  - LV size was normal and appropriately increased from the prior   stage. - LV global systolic function was mildly reduced. The estimated LV   ejection fraction was 45-50%. - Mild aortic stenosis. Mean gradient 20 mmHg. Mild aortic   regurgitation.  Peak stress 20 ug/kg/min:  - LV size was normal, increased from the prior stage, and increased   from baseline. - LV global systolic function was normal. The estimated LV ejection   fraction was 55-60%. - Moderate to severe aortic stenosis. Peak velocity 3.9 m/s. Mean   gradient 38 mmHg.  Recovery:  ------------------------------------------------------------------- Stress echo results:     Left ventricular ejection fraction was normal at rest and with stress. There was no echocardiographic evidence for stress-induced ischemia.  ------------------------------------------------------------------- Measurements   Left ventricle                           Value  Stroke volume, 2D                         66    ml  Stroke volume/bsa, 2D                    35  ml/m^2    LVOT                                     Value  LVOT ID, S                               20    mm  LVOT area                                3.14  cm^2  LVOT peak velocity, S                    58.4  cm/s  LVOT mean velocity, S                    41.7  cm/s  LVOT VTI, S                              21.1  cm    Aortic valve                             Value  Aortic valve peak velocity, S            388   cm/s  Aortic valve mean velocity, S            285   cm/s  Aortic valve VTI, S                      68.8  cm  Aortic mean gradient, S                  38    mm Hg  Aortic peak gradient, S                  60    mm Hg  VTI ratio, LVOT/AV                       0.31  Aortic valve area, VTI                   0.96  cm^2  Aortic valve area/bsa, VTI               0.51  cm^2/m^2  Velocity ratio, peak, LVOT/AV            0.15  Aortic valve area, peak velocity         0.47  cm^2  Aortic valve area/bsa, peak velocity     0.25  cm^2/m^2  Velocity ratio, mean, LVOT/AV            0.15  Aortic valve area, mean velocity         0.46  cm^2  Aortic valve area/bsa, mean velocity     0.24  cm^2/m^2  Aortic regurg pressure half-time         601   ms  Legend: (L)  and  (H)  mark values outside specified reference range.  ------------------------------------------------------------------- Prepared and Electronically Authenticated by  Skeet Latch, MD 2018-06-13T21:13:03   Impression:  This 66 year old gentleman has stage C severe, low gradient, low ejection fraction asymptomatic  aortic stenosis. I have personally reviewed his cath and echo studies. He has a severely calcified and thickened aortic valve with poor mobility with a rise in the mean gradient from 16 to 38 with dobutamine and a significant improvement in his EF with dobutamine. Given his LV dysfunction I think it is best to proceed with AVR at this time to  prevent further deterioration of LV function. His cath shows mild non-obstructive CAD. I discussed the alternatives of mechanical and tissue valves with the patient and his wife. He would like to use a tissue valve which I think is the best choice at his age. I discussed the operative procedure with the patient and his wife including alternatives, benefits and risks; including but not limited to bleeding, blood transfusion, infection, stroke, myocardial infarction, heart block requiring a permanent pacemaker, organ dysfunction, and death.  [name] Brown understands and agrees to proceed.     Plan:  He will call to schedule AVR using a bioprosthetic valve.  I spent 60 minutes performing this consultation and > 50% of this time was spent face to face counseling and coordinating the care of this patient's severe aortic stenosis.   [name], MD [facility] [phone]

## 2017-04-04 ENCOUNTER — Other Ambulatory Visit: Payer: Self-pay | Admitting: *Deleted

## 2017-04-04 DIAGNOSIS — I350 Nonrheumatic aortic (valve) stenosis: Secondary | ICD-10-CM

## 2017-04-09 NOTE — Progress Notes (Deleted)
Cardiology Office Note   Date:  04/09/2017   ID:  [name] Brown, DOB 1950-11-12, MRN [id number]  PCP:  [name], MD  Cardiologist:   Skeet Latch, MD   No chief complaint on file.    History of Present Illness: [name] Brown is a 66 y.o. 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  [name] Brown saw Dr. [name] on 09/23/16 and was noted to have coronary atherosclerosis on chest xray.  He had an echo 10/12/16 that revealed LVEF 30-35% with grade 1 diastolic dysfunction.  His aortic valve was heavily calcified and likely bicuspid.  Although the mean gradient was 14 mmHg, it was felt that he likely had moderate aortic stenosis given his reduced systolic function and leaflet restriction.   He was started on aspirin and referred to cardiology for evaluation.  He was referred for cardiac catheterization 10/2016 that revealed mild-mod CAD and LVEF 45-50%.  He has been asymptomatic.  [name] Brown was referred for a repeat echo 01/17/17 that revealed LVEF 30-35% with grade 1 diastolic dysfunction. There was fusion of the right and noncoronary cusps and leaflet mobility was severely restricted. The mean gradient was 16 mmHg.  His last appointment [name] Brown was started on rosuvavastatin.  LFTs were mildly increased, so this was reduced to 10 mg.  He continues to feel well.  He denies chest pain or shortness of breath. He also has not noted any lower extremity edema, orthopnea, or PND. He continues to exercise regularly.  He walks 3.5 miles 3-4 times per week and went kayaking for 7 hours last week.  He does house and yard work without symptoms.  He notes that his BP is sometimes low in the AM but improves throughout the day,  [name] Brown was treated for colon cancer with surgery only and did not require chemotherapy.  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     Past Medical History:  Diagnosis Date  . Colon cancer ([place])   . Family history of adverse reaction to anesthesia    pt states his mother had 2 episodes of hyponatremia following anesthesia   . GERD (gastroesophageal reflux disease)   . Hyperlipidemia 10/25/2016  . Hypertension    pt is currently not taking any medications; pt states is borderline  . Melanoma ([place])   . Mild aortic stenosis 10/25/2016  . Perforated sigmoid colon ([place])   . PVC's (premature ventricular contractions) 10/25/2016  . Tinnitus   . Wears glasses     Past Surgical History:  Procedure Laterality Date  . COLOSTOMY CLOSURE N/A 08/18/2016   Procedure: COLOSTOMY CLOSURE OPEN PROCEDURE;  Surgeon: Armandina Gemma, MD;  Location: [facility];  Service: General;  Laterality: N/A;  . LAPAROTOMY N/A 04/25/2016   Procedure: EXPLORATORY LAPAROTOMY WITH SIGMOID COLECTOMY, COLOSTOMY;  Surgeon: Armandina Gemma, MD;  Location: [facility];  Service: General;  Laterality: N/A;  . LEFT HEART CATH AND CORONARY ANGIOGRAPHY N/A 11/03/2016   Procedure: Left Heart Cath and Coronary Angiography;  Surgeon: Burnell Blanks, MD;  Location: [facility];  Service: Cardiovascular;  Laterality: N/A;  . MELANOMA EXCISION    . PARTIAL COLECTOMY Right 08/18/2016   Procedure: RIGHT COLECTOMY;  Surgeon: Armandina Gemma, MD;  Location: [facility];  Service: General;  Laterality: Right;     Current  Outpatient Prescriptions  Medication Sig Dispense Refill  . aspirin EC 81 MG tablet Take 81 mg by mouth daily.    . colestipol (COLESTID) 1 g tablet Take 1 g by mouth daily as needed.    . ezetimibe (ZETIA) 10 MG tablet Take 1 tablet (10 mg total) by mouth daily. 90 tablet 3  . metoprolol succinate (TOPROL-XL) 25 MG 24 hr tablet Take 1 tablet (25 mg total) by mouth daily. Take with or immediately following a meal. 90 tablet 3  . valsartan (DIOVAN) 40 MG tablet Take 1 tablet (40 mg total) by mouth daily. 90  tablet 1   No current facility-administered medications for this visit.     Allergies:   Patient has no known allergies.    Social History:  The patient  reports that he has never smoked. He has never used smokeless tobacco. He reports that he drinks alcohol. He reports that he does not use drugs.   Family History:  The patient's family history includes Alcohol abuse in his father; CAD in his mother; Cirrhosis in his father; Dementia in his mother; Heart attack in his father; Kidney disease in his mother; Stroke in his maternal grandmother and paternal grandmother; Vascular Disease in his mother.    ROS:  Please see the history of present illness.   Otherwise, review of systems are positive for none.   All other systems are reviewed and negative.    PHYSICAL EXAM: VS:  There were no vitals taken for this visit. , BMI There is no height or weight on file to calculate BMI. GENERAL:  Well appearing.  No acute distress. HEENT:  Pupils equal round and reactive, fundi not visualized, oral mucosa unremarkable NECK:  No jugular venous distention, waveform within normal limits, carotid upstroke brisk and symmetric, no bruits LUNGS:  Clear to auscultation bilaterally. No crackles, wheezes, rhonchi.  HEART:  RRR.  PMI not displaced or sustained,S1 and S2 within normal limits, no S3, no S4, no clicks, no rubs, III/VI mid-peaking systolic murmurs at the LUSB with radiation to the R carotid.   ABD:  Flat, positive bowel sounds normal in frequency in pitch, no bruits, no rebound, no guarding, no midline pulsatile mass, no hepatomegaly, no splenomegaly EXT:  2 plus pulses throughout, no edema, no cyanosis no clubbing SKIN:  No rashes no nodules NEURO:  Cranial nerves II through XII grossly intact, motor grossly intact throughout PSYCH:  Cognitively intact, oriented to person place and time   EKG:  EKG is not ordered today. The ekg ordered 10/25/16 demonstrates sinus rhythm.  Ventricular bigeminy.    Echo 01/17/17: Study Conclusions  - Left ventricle: The cavity size was normal. Wall thickness was   normal. Systolic function was moderately to severely reduced. The   estimated ejection fraction was in the range of 30% to 35%.   Moderate diffuse hypokinesis with no identifiable regional   variations. Doppler parameters are consistent with abnormal left   ventricular relaxation (grade 1 diastolic dysfunction). - Ventricular septum: Septal motion showed paradox. These changes   are consistent with a left bundle branch block. - Aortic valve: Possibly bicuspid; severely thickened, severely   calcified leaflets; fusion of the right-noncoronary commissure.   Anterior cusp mobility was severely restricted. Transvalvular   velocity was increased, due to low cardiac output. There was   moderate to severe stenosis. There was mild regurgitation. - Right ventricle: Systolic function was mildly reduced.  Impressions:  - Suspect low gradient severe aortic stenosis.  Recommendations:  Consider dobutamine echo.  LHC 11/03/16:   Mid RCA to Dist RCA lesion, 10 %stenosed.  RPDA lesion, 10 %stenosed.  Ost Cx to Prox Cx lesion, 40 %stenosed.  Ost Ramus to Ramus lesion, 20 %stenosed.  Mid LAD lesion, 20 %stenosed.  Prox LAD lesion, 30 %stenosed.  The left ventricular ejection fraction is 45-50% by visual estimate.  There is mild left ventricular systolic dysfunction.  LV end diastolic pressure is normal.  There is no mitral valve regurgitation.   Recent Labs: 08/19/2016: Magnesium 1.7 03/14/2017: ALT 33; BUN 14; Creatinine, Ser 1.02; Potassium 5.1; Sodium 139 03/23/2017: Hemoglobin 15.8; Platelets 204.0; TSH 4.40    Lipid Panel    Component Value Date/Time   CHOL 204 (H) 03/14/2017 1042   TRIG 96 03/14/2017 1042   HDL 57 03/14/2017 1042   CHOLHDL 3.6 03/14/2017 1042   CHOLHDL 3 09/23/2016 1435   VLDL 24.2 09/23/2016 1435   LDLCALC 128 (H) 03/14/2017 1042      Wt  Readings from Last 3 Encounters:  03/29/17 73.1 kg (161 lb 3.2 oz)  03/23/17 74.8 kg (165 lb)  02/22/17 75 kg (165 lb 5.5 oz)      ASSESSMENT AND PLAN:  # Chronic systolic and diastolic heart failure:  Mr. Stettler' ejection fraction was noted to be low on echocardiogram  He didn't have any significant CAD on LHC.  LVEF was 45-50% on left ventriculography Repeat echo again noted LV systolic function of 26-94%. He is completely asymptomatic. He has been on metoprolol and valsartan for >3 months.  We will get a cardiac MRI both for assessment of etiology and quantification of LVEF.  If it remains less than or equal to 35% we will refer him for an ICD .  Continue metoprolol and valsartan.   # Non-obstructive CAD:  # Hyperlipidemia:  Continue aspirin and rosuvastatin 10 mg daily.  Check CMP today.  Fasting lipids in 1 month.   # Mild-moderate AS:   # Bicuspid aortic valve: Mr. Barto has fusion of the right and noncoronary cusps. There is slightly restricted motion but likely mild to moderate aortic stenosis. Mean gradient was 14 mmHg. This may be artificially low due to his reduced systolic function. Given his reduced systolic function we will get a dobuatmine echo to ensure that he does not have severe aortic stenosis and that this is not the cause of his reduced systolic function.   # Hypertension:  Blood pressure is well-controlled on valsartan and metoprolol.  # PVCs:  Better on metoprolol.     Current medicines are reviewed at length with the patient today.  The patient does not have concerns regarding medicines.  The following changes have been made:  None Labs/ tests ordered today include:   No orders of the defined types were placed in this encounter.    Disposition:   FU with Kyndall Chaplin C. Oval Linsey, MD, [facility] in 2 months.    This note was written with the assistance of speech recognition software.  Please excuse any transcriptional errors.  Signed, Glenora Morocho C. Oval Linsey, MD,  [facility]  04/09/2017 3:40 PM    [facility]

## 2017-04-10 ENCOUNTER — Encounter: Payer: Self-pay | Admitting: Cardiovascular Disease

## 2017-04-10 ENCOUNTER — Ambulatory Visit: Payer: Medicare Other | Admitting: Cardiovascular Disease

## 2017-04-11 ENCOUNTER — Other Ambulatory Visit: Payer: Self-pay | Admitting: Cardiovascular Disease

## 2017-04-11 NOTE — Telephone Encounter (Signed)
Please review for refill, thanks ! 

## 2017-04-13 NOTE — Telephone Encounter (Signed)
New message     *STAT* If patient is at the pharmacy, call can be transferred to refill team.   1. Which medications need to be refilled? (please list name of each medication and dose if known) valsartan 40 mg  2. Which pharmacy/location (including street and city if local pharmacy) is medication to be sent to? [place] [facility] [place]   3. Do they need a 30 day or 90 day supply? 90 day

## 2017-04-14 ENCOUNTER — Telehealth: Payer: Self-pay | Admitting: *Deleted

## 2017-04-14 DIAGNOSIS — E7849 Other hyperlipidemia: Secondary | ICD-10-CM

## 2017-04-14 DIAGNOSIS — I10 Essential (primary) hypertension: Secondary | ICD-10-CM

## 2017-04-14 DIAGNOSIS — I2510 Atherosclerotic heart disease of native coronary artery without angina pectoris: Secondary | ICD-10-CM

## 2017-04-14 MED ORDER — LOSARTAN POTASSIUM 25 MG PO TABS: 25.0000 mg | ORAL_TABLET | Freq: Every day | ORAL | 1 refills | Status: DC

## 2017-04-14 NOTE — Telephone Encounter (Signed)
Spoke with patient regarding his Valsartan Rx that has been recalled Per Protocol changed to Losartan 25 mg daily, rx sent to pharmacy He will monitor blood pressure at home and call if any issues Will recheck labs in a few weeks from starting Zetia

## 2017-04-17 DIAGNOSIS — K631 Perforation of intestine (nontraumatic): Secondary | ICD-10-CM | POA: Diagnosis not present

## 2017-04-17 DIAGNOSIS — C182 Malignant neoplasm of ascending colon: Secondary | ICD-10-CM | POA: Diagnosis not present

## 2017-04-27 ENCOUNTER — Other Ambulatory Visit: Payer: Self-pay

## 2017-05-11 DIAGNOSIS — E784 Other hyperlipidemia: Secondary | ICD-10-CM | POA: Diagnosis not present

## 2017-05-11 DIAGNOSIS — I2510 Atherosclerotic heart disease of native coronary artery without angina pectoris: Secondary | ICD-10-CM | POA: Diagnosis not present

## 2017-05-11 DIAGNOSIS — I10 Essential (primary) hypertension: Secondary | ICD-10-CM | POA: Diagnosis not present

## 2017-05-11 LAB — LIPID PANEL
CHOLESTEROL TOTAL: 177 mg/dL (ref 100–199)
Chol/HDL Ratio: 3 ratio (ref 0.0–5.0)
HDL: 59 mg/dL (ref >39)
LDL Calculated: 90 mg/dL (ref 0–99)
Triglycerides: 141 mg/dL (ref 0–149)
VLDL CHOLESTEROL CAL: 28 mg/dL (ref 5–40)

## 2017-05-11 LAB — HEPATIC FUNCTION PANEL
ALT: 34 IU/L (ref 0–44)
AST: 24 IU/L (ref 0–40)
Albumin: 4.6 g/dL (ref 3.6–4.8)
Alkaline Phosphatase: 70 IU/L (ref 39–117)
Bilirubin Total: 0.7 mg/dL (ref 0.0–1.2)
Bilirubin, Direct: 0.19 mg/dL (ref 0.00–0.40)
TOTAL PROTEIN: 6.8 g/dL (ref 6.0–8.5)

## 2017-05-24 ENCOUNTER — Ambulatory Visit (INDEPENDENT_AMBULATORY_CARE_PROVIDER_SITE_OTHER): Payer: Medicare Other | Admitting: Surgery

## 2017-05-24 ENCOUNTER — Encounter: Payer: Self-pay | Admitting: Surgery

## 2017-05-24 VITALS — BP 126/76 | HR 69 | Resp 16 | Ht 66.0 in | Wt 169.6 lb

## 2017-05-24 DIAGNOSIS — I350 Nonrheumatic aortic (valve) stenosis: Secondary | ICD-10-CM | POA: Diagnosis not present

## 2017-05-24 DIAGNOSIS — I2584 Coronary atherosclerosis due to calcified coronary lesion: Secondary | ICD-10-CM

## 2017-05-24 DIAGNOSIS — I2510 Atherosclerotic heart disease of native coronary artery without angina pectoris: Secondary | ICD-10-CM | POA: Diagnosis not present

## 2017-05-27 ENCOUNTER — Encounter: Payer: Self-pay | Admitting: Surgery

## 2017-05-27 NOTE — Progress Notes (Signed)
     HPI:  [name] returns today to review plans for his AVR surgery. He has stage C severe, low gradient, low ejection fraction asymptomatic aortic stenosis.He has a severely calcified and thickened aortic valve with poor mobility with a rise in the mean gradient from 16 to 38 with dobutamine and a significant improvement in his EF with dobutamine. Given his LV dysfunction I think it is best to proceed with AVR at this time even though he says he is asymptomatic to prevent further deterioration of LV function. His cath shows mild non-obstructive CAD. He says that he feels well and is still walking for miles daily without shortness of breath or fatigue. He has not had any chest pain or dizziness.  Current Outpatient Prescriptions  Medication Sig Dispense Refill  . aspirin EC 81 MG tablet Take 81 mg by mouth daily.    . colestipol (COLESTID) 1 g tablet Take 1 g by mouth daily as needed.    . ezetimibe (ZETIA) 10 MG tablet Take 1 tablet (10 mg total) by mouth daily. 90 tablet 3  . losartan (COZAAR) 25 MG tablet Take 1 tablet (25 mg total) by mouth daily. 90 tablet 1  . metoprolol succinate (TOPROL-XL) 25 MG 24 hr tablet Take 1 tablet (25 mg total) by mouth daily. Take with or immediately following a meal. 90 tablet 3   No current facility-administered medications for this visit.      Physical Exam: BP 126/76 (BP Location: Right Arm, Patient Position: Sitting, Cuff Size: Large)   Pulse 69   Resp 16   Ht 5\' 6"  (1.676 m)   Wt 169 lb 9.6 oz (76.9 kg)   SpO2 95% Comment: ON RA  BMI 27.37 kg/m  He looks well Cardiac exam shows a regular rate and rhythm with a 3/6 systolic murmur along the RSB. Lungs are clear. There is no peripheral edema   Impression:  He has severe, low gradient, low EF asymptomatic aortic stenosis that requires AVR. I discussed the alternatives of mechanical and tissue valves with the patient and he would like to use a tissue valve which I think is the best choice  at his age. He does not want to be on Coumadin. I discussed the operative procedure with the patient including alternatives, benefits and risks; including but not limited to bleeding, blood transfusion, infection, stroke, myocardial infarction, graft failure, heart block requiring a permanent pacemaker, organ dysfunction, and death.  [name] Brown understands and agrees to proceed.    Plan:  AVR using a bioprosthetic valve on 06/08/2017.   [name] Pollack, MD [facility] [phone]

## 2017-06-01 ENCOUNTER — Other Ambulatory Visit: Payer: Self-pay

## 2017-06-05 DIAGNOSIS — D2271 Melanocytic nevi of right lower limb, including hip: Secondary | ICD-10-CM | POA: Diagnosis not present

## 2017-06-05 DIAGNOSIS — D225 Melanocytic nevi of trunk: Secondary | ICD-10-CM | POA: Diagnosis not present

## 2017-06-05 DIAGNOSIS — Z85820 Personal history of malignant melanoma of skin: Secondary | ICD-10-CM | POA: Diagnosis not present

## 2017-06-05 DIAGNOSIS — L821 Other seborrheic keratosis: Secondary | ICD-10-CM | POA: Diagnosis not present

## 2017-06-05 DIAGNOSIS — D2261 Melanocytic nevi of right upper limb, including shoulder: Secondary | ICD-10-CM | POA: Diagnosis not present

## 2017-06-05 DIAGNOSIS — D2272 Melanocytic nevi of left lower limb, including hip: Secondary | ICD-10-CM | POA: Diagnosis not present

## 2017-06-05 DIAGNOSIS — D692 Other nonthrombocytopenic purpura: Secondary | ICD-10-CM | POA: Diagnosis not present

## 2017-06-05 DIAGNOSIS — L812 Freckles: Secondary | ICD-10-CM | POA: Diagnosis not present

## 2017-06-05 NOTE — Pre-Procedure Instructions (Signed)
[name] Brown  06/05/2017      [facility] [address], [place] [address] Phone: [phone] Fax: [phone]    Your procedure is scheduled on Thursday, June 08, 2017  Report to [facility] Admitting Entrance "A" at 5:30 A.M.   Call this number if you have problems the morning of surgery:  [phone]   Remember:  Do not eat food or drink liquids after midnight.  Take these medicines the morning of surgery with A SIP OF WATER: If needed Famotidine (PEPCID) for acid reflux.  Follow your doctor's instruction for taking ASPIRIN.  As of today, stop taking all Aspirin porducts, Vitamins, Fish oils, and Herbal medications. Also stop all NSAIDS i.e. Advil, Motrin, Aleve, Anaprox, Naproxen, BC and Goody Powders.   Do not wear jewelry.  Do not wear lotions, powders, or perfumes, or deodorant.  Do not shave 48 hours prior to surgery.  Men may shave face and neck.  Do not bring valuables to the hospital.  [facility] is not responsible for any belongings or valuables.  Contacts, dentures or bridgework may not be worn into surgery.  Leave your suitcase in the car.  After surgery it may be brought to your room.  For patients admitted to the hospital, discharge time will be determined by your treatment team.  Patients discharged the day of surgery will not be allowed to drive home.   Special instructions:  [place]- Preparing For Surgery  Before surgery, you can play an important role. Because skin is not sterile, your skin needs to be as free of germs as possible. You can reduce the number of germs on your skin by washing with CHG (chlorahexidine gluconate) Soap before surgery.  CHG is an antiseptic cleaner which kills germs and bonds with the skin to continue killing germs even after washing.  Please do not use if you have an allergy to CHG or antibacterial soaps. If your skin becomes  reddened/irritated stop using the CHG.  Do not shave (including legs and underarms) for at least 48 hours prior to first CHG shower. It is OK to shave your face.  Please follow these instructions carefully.   1. Shower the NIGHT BEFORE SURGERY and the MORNING OF SURGERY with CHG.   2. If you chose to wash your hair, wash your hair first as usual with your normal shampoo.  3. After you shampoo, rinse your hair and body thoroughly to remove the shampoo.  4. Use CHG as you would any other liquid soap. You can apply CHG directly to the skin and wash gently with a scrungie or a clean washcloth.   5. Apply the CHG Soap to your body ONLY FROM THE NECK DOWN.  Do not use on open wounds or open sores. Avoid contact with your eyes, ears, mouth and genitals (private parts). Wash genitals (private parts) with your normal soap.  6. Wash thoroughly, paying special attention to the area where your surgery will be performed.  7. Thoroughly rinse your body with warm water from the neck down.  8. DO NOT shower/wash with your normal soap after using and rinsing off the CHG Soap.  9. Pat yourself dry with a CLEAN TOWEL.   10. Wear CLEAN PAJAMAS   11. Place CLEAN SHEETS on your bed the night of your first shower and DO NOT SLEEP WITH PETS.  Day of Surgery: Do not apply any deodorants/lotions. Please wear clean  clothes to the hospital/surgery center.    Please read over the following fact sheets that you were given. Pain Booklet, Coughing and Deep Breathing, Blood Transfusion Information, MRSA Information and Surgical Site Infection Prevention

## 2017-06-06 ENCOUNTER — Ambulatory Visit (HOSPITAL_COMMUNITY)
Admission: RE | Admit: 2017-06-06 | Discharge: 2017-06-06 | Disposition: A | Discharge status: Home / Self Care | Payer: Medicare Other | Source: Ambulatory Visit | Attending: Surgery | Admitting: Surgery

## 2017-06-06 ENCOUNTER — Encounter (HOSPITAL_COMMUNITY): Payer: Self-pay

## 2017-06-06 ENCOUNTER — Encounter (HOSPITAL_COMMUNITY)
Admission: RE | Admit: 2017-06-06 | Discharge: 2017-06-06 | Disposition: A | Discharge status: Home / Self Care | Payer: Medicare Other | Source: Ambulatory Visit | Attending: Surgery | Admitting: Surgery

## 2017-06-06 ENCOUNTER — Ambulatory Visit (HOSPITAL_BASED_OUTPATIENT_CLINIC_OR_DEPARTMENT_OTHER)
Admission: RE | Admit: 2017-06-06 | Discharge: 2017-06-06 | Disposition: A | Discharge status: Home / Self Care | Payer: Medicare Other | Source: Ambulatory Visit | Attending: Surgery | Admitting: Surgery

## 2017-06-06 DIAGNOSIS — I350 Nonrheumatic aortic (valve) stenosis: Secondary | ICD-10-CM

## 2017-06-06 DIAGNOSIS — R9431 Abnormal electrocardiogram [ECG] [EKG]: Secondary | ICD-10-CM | POA: Insufficient documentation

## 2017-06-06 DIAGNOSIS — Z01810 Encounter for preprocedural cardiovascular examination: Secondary | ICD-10-CM | POA: Insufficient documentation

## 2017-06-06 DIAGNOSIS — D62 Acute posthemorrhagic anemia: Secondary | ICD-10-CM | POA: Diagnosis not present

## 2017-06-06 DIAGNOSIS — I110 Hypertensive heart disease with heart failure: Secondary | ICD-10-CM | POA: Diagnosis not present

## 2017-06-06 DIAGNOSIS — Z01818 Encounter for other preprocedural examination: Secondary | ICD-10-CM | POA: Insufficient documentation

## 2017-06-06 DIAGNOSIS — J929 Pleural plaque without asbestos: Secondary | ICD-10-CM | POA: Diagnosis not present

## 2017-06-06 DIAGNOSIS — Z85038 Personal history of other malignant neoplasm of large intestine: Secondary | ICD-10-CM | POA: Diagnosis not present

## 2017-06-06 DIAGNOSIS — E785 Hyperlipidemia, unspecified: Secondary | ICD-10-CM | POA: Diagnosis not present

## 2017-06-06 DIAGNOSIS — Z01812 Encounter for preprocedural laboratory examination: Secondary | ICD-10-CM

## 2017-06-06 DIAGNOSIS — Z9049 Acquired absence of other specified parts of digestive tract: Secondary | ICD-10-CM | POA: Diagnosis not present

## 2017-06-06 DIAGNOSIS — I428 Other cardiomyopathies: Secondary | ICD-10-CM | POA: Diagnosis not present

## 2017-06-06 DIAGNOSIS — I4892 Unspecified atrial flutter: Secondary | ICD-10-CM | POA: Diagnosis not present

## 2017-06-06 DIAGNOSIS — I5042 Chronic combined systolic (congestive) and diastolic (congestive) heart failure: Secondary | ICD-10-CM | POA: Diagnosis not present

## 2017-06-06 DIAGNOSIS — I471 Supraventricular tachycardia: Secondary | ICD-10-CM | POA: Diagnosis not present

## 2017-06-06 DIAGNOSIS — I440 Atrioventricular block, first degree: Secondary | ICD-10-CM | POA: Diagnosis not present

## 2017-06-06 DIAGNOSIS — I2510 Atherosclerotic heart disease of native coronary artery without angina pectoris: Secondary | ICD-10-CM | POA: Diagnosis not present

## 2017-06-06 HISTORY — DX: Nonrheumatic aortic (valve) stenosis: I35.0

## 2017-06-06 HISTORY — DX: Headache: R51

## 2017-06-06 HISTORY — DX: Cardiac murmur, unspecified: R01.1

## 2017-06-06 HISTORY — DX: Headache, unspecified: R51.9

## 2017-06-06 LAB — URINALYSIS, ROUTINE W REFLEX MICROSCOPIC
Bilirubin Urine: NEGATIVE
Glucose, UA: NEGATIVE mg/dL
Hgb urine dipstick: NEGATIVE
Ketones, ur: NEGATIVE mg/dL
LEUKOCYTES UA: NEGATIVE
NITRITE: NEGATIVE
Protein, ur: NEGATIVE mg/dL
SPECIFIC GRAVITY, URINE: 1.016 (ref 1.005–1.030)
pH: 7 (ref 5.0–8.0)

## 2017-06-06 LAB — SURGICAL PCR SCREEN
MRSA, PCR: NEGATIVE
Staphylococcus aureus: NEGATIVE

## 2017-06-06 LAB — PULMONARY FUNCTION TEST
DL/VA % pred: 90 %
DL/VA: 3.94 ml/min/mmHg/L
DLCO UNC: 22.13 ml/min/mmHg
DLCO unc % pred: 82 %
FEF 25-75 Post: 3.47 L/sec
FEF 25-75 Pre: 3.15 L/sec
FEF2575-%Change-Post: 10 %
FEF2575-%Pred-Post: 153 %
FEF2575-%Pred-Pre: 138 %
FEV1-%CHANGE-POST: 0 %
FEV1-%PRED-PRE: 111 %
FEV1-%Pred-Post: 110 %
FEV1-POST: 3.19 L
FEV1-PRE: 3.21 L
FEV1FVC-%CHANGE-POST: 1 %
FEV1FVC-%Pred-Pre: 109 %
FEV6-%Change-Post: -2 %
FEV6-%PRED-PRE: 107 %
FEV6-%Pred-Post: 105 %
FEV6-POST: 3.85 L
FEV6-PRE: 3.95 L
FEV6FVC-%PRED-POST: 106 %
FEV6FVC-%Pred-Pre: 106 %
FVC-%Change-Post: -2 %
FVC-%PRED-POST: 99 %
FVC-%PRED-PRE: 101 %
FVC-POST: 3.86 L
FVC-PRE: 3.95 L
POST FEV6/FVC RATIO: 100 %
PRE FEV6/FVC RATIO: 100 %
Post FEV1/FVC ratio: 83 %
Pre FEV1/FVC ratio: 81 %
RV % PRED: 66 %
RV: 1.44 L
TLC % PRED: 92 %
TLC: 5.74 L

## 2017-06-06 LAB — VAS US DOPPLER PRE CABG
LCCADDIAS: -21 cm/s
LCCADSYS: -63 cm/s
LCCAPSYS: 67 cm/s
LEFT ECA DIAS: -8 cm/s
LEFT VERTEBRAL DIAS: -14 cm/s
LICADSYS: -50 cm/s
Left CCA prox dias: 13 cm/s
Left ICA dist dias: -16 cm/s
Left ICA prox dias: -17 cm/s
Left ICA prox sys: -64 cm/s
RCCADSYS: -61 cm/s
RCCAPSYS: 67 cm/s
RIGHT ECA DIAS: -15 cm/s
RIGHT VERTEBRAL DIAS: 9 cm/s
Right CCA prox dias: 13 cm/s

## 2017-06-06 LAB — BLOOD GAS, ARTERIAL
ACID-BASE EXCESS: 1 mmol/L (ref 0.0–2.0)
BICARBONATE: 24.5 mmol/L (ref 20.0–28.0)
DRAWN BY: 449841
FIO2: 21
O2 Saturation: 95.4 %
PATIENT TEMPERATURE: 98.6
PH ART: 7.457 — AB (ref 7.350–7.450)
pCO2 arterial: 35.1 mmHg (ref 32.0–48.0)
pO2, Arterial: 76 mmHg — ABNORMAL LOW (ref 83.0–108.0)

## 2017-06-06 LAB — COMPREHENSIVE METABOLIC PANEL
ALBUMIN: 4.1 g/dL (ref 3.5–5.0)
ALT: 42 U/L (ref 17–63)
ANION GAP: 6 (ref 5–15)
AST: 31 U/L (ref 15–41)
Alkaline Phosphatase: 54 U/L (ref 38–126)
BUN: 16 mg/dL (ref 6–20)
CHLORIDE: 106 mmol/L (ref 101–111)
CO2: 24 mmol/L (ref 22–32)
Calcium: 9.2 mg/dL (ref 8.9–10.3)
Creatinine, Ser: 0.93 mg/dL (ref 0.61–1.24)
GFR calc Af Amer: >60 mL/min (ref >60)
GFR calc non Af Amer: >60 mL/min (ref >60)
GLUCOSE: 94 mg/dL (ref 65–99)
POTASSIUM: 3.9 mmol/L (ref 3.5–5.1)
SODIUM: 136 mmol/L (ref 135–145)
Total Bilirubin: 0.9 mg/dL (ref 0.3–1.2)
Total Protein: 6.1 g/dL — ABNORMAL LOW (ref 6.5–8.1)

## 2017-06-06 LAB — HEMOGLOBIN A1C
Hgb A1c MFr Bld: 5.1 % (ref 4.8–5.6)
Mean Plasma Glucose: 99.67 mg/dL

## 2017-06-06 LAB — CBC
HCT: 42.7 % (ref 39.0–52.0)
Hemoglobin: 14.5 g/dL (ref 13.0–17.0)
MCH: 32.2 pg (ref 26.0–34.0)
MCHC: 34 g/dL (ref 30.0–36.0)
MCV: 94.9 fL (ref 78.0–100.0)
Platelets: 170 10*3/uL (ref 150–400)
RBC: 4.5 MIL/uL (ref 4.22–5.81)
RDW: 13.1 % (ref 11.5–15.5)
WBC: 4.4 10*3/uL (ref 4.0–10.5)

## 2017-06-06 LAB — TYPE AND SCREEN
ABO/RH(D): B POS
Antibody Screen: NEGATIVE

## 2017-06-06 LAB — ABO/RH: ABO/RH(D): B POS

## 2017-06-06 LAB — APTT: aPTT: 28 seconds (ref 24–36)

## 2017-06-06 LAB — PROTIME-INR
INR: 0.97
Prothrombin Time: 12.8 seconds (ref 11.4–15.2)

## 2017-06-06 MED ORDER — ALBUTEROL SULFATE (2.5 MG/3ML) 0.083% IN NEBU
2.5000 mg | INHALATION_SOLUTION | Freq: Once | RESPIRATORY_TRACT | Status: AC
  Administered 2017-06-06: 2.5 mg via RESPIRATORY_TRACT

## 2017-06-06 NOTE — Progress Notes (Signed)
Pre-op Cardiac Surgery  Carotid Findings:  Bilateral - No evidence of extracranial ICA stenosis. Vertebral artery flow is antegrade  Upper Extremity Right Left  Brachial Pressures 132 Triphasic 143 Triphasic  Radial Waveforms Triphasic Triphasic  Ulnar Waveforms Triphasic Triphasic  Palmar Arch (Allen's Test) Normal Normal   Findings: Doppler waveforms remained normal bilaterally with both radial and ulnar compressions    [facility], [facility]  06/06/2017, 11:58 am

## 2017-06-06 NOTE — Telephone Encounter (Signed)
Please review for refill with a change in medication if not already done so. Thanks!

## 2017-06-06 NOTE — Progress Notes (Signed)
PCP - [facility]  Chest x-ray - 06/06/2017  EKG - 06/06/2017  Stress Test - 02/22/17 Cardiac Cath - 11/03/16  Pt to continue taking ASA up until the day before surgery.   Patient denies shortness of breath, fever, cough and chest pain at PAT appointment  Patient verbalized understanding of instructions that were given to them at the PAT appointment. Patient was also instructed that they will need to review over the PAT instructions again at home before surgery.

## 2017-06-07 ENCOUNTER — Encounter (HOSPITAL_COMMUNITY): Payer: Self-pay

## 2017-06-07 MED ORDER — MAGNESIUM SULFATE 50 % IJ SOLN
40.0000 meq | INTRAMUSCULAR | Status: DC
  Filled 2017-06-07: qty 10

## 2017-06-07 MED ORDER — PHENYLEPHRINE HCL 10 MG/ML IJ SOLN
30.0000 ug/min | INTRAMUSCULAR | Status: DC
  Filled 2017-06-07: qty 2

## 2017-06-07 MED ORDER — NITROGLYCERIN IN D5W 200-5 MCG/ML-% IV SOLN
2.0000 ug/min | INTRAVENOUS | Status: AC
  Administered 2017-06-08: 5 ug/min via INTRAVENOUS
  Filled 2017-06-07: qty 250

## 2017-06-07 MED ORDER — EPINEPHRINE PF 1 MG/ML IJ SOLN
0.0000 ug/min | INTRAVENOUS | Status: DC
  Filled 2017-06-07: qty 4

## 2017-06-07 MED ORDER — TRANEXAMIC ACID (OHS) PUMP PRIME SOLUTION
2.0000 mg/kg | INTRAVENOUS | Status: DC
  Filled 2017-06-07: qty 1.54

## 2017-06-07 MED ORDER — SODIUM CHLORIDE 0.9 % IV SOLN
INTRAVENOUS | Status: DC
  Filled 2017-06-07: qty 1

## 2017-06-07 MED ORDER — DEXMEDETOMIDINE HCL IN NACL 400 MCG/100ML IV SOLN
0.1000 ug/kg/h | INTRAVENOUS | Status: AC
  Administered 2017-06-08: .3 ug/kg/h via INTRAVENOUS
  Filled 2017-06-07: qty 100

## 2017-06-07 MED ORDER — PAPAVERINE HCL 30 MG/ML IJ SOLN
INTRAMUSCULAR | Status: DC
  Filled 2017-06-07: qty 2.5

## 2017-06-07 MED ORDER — TRANEXAMIC ACID 1000 MG/10ML IV SOLN
1.5000 mg/kg/h | INTRAVENOUS | Status: AC
  Administered 2017-06-08: 1.5 mg/kg/h via INTRAVENOUS
  Filled 2017-06-07: qty 25

## 2017-06-07 MED ORDER — DEXTROSE 5 % IV SOLN
1.5000 g | INTRAVENOUS | Status: AC
  Administered 2017-06-08: .75 g via INTRAVENOUS
  Administered 2017-06-08: 1.5 g via INTRAVENOUS
  Filled 2017-06-07: qty 1.5

## 2017-06-07 MED ORDER — POTASSIUM CHLORIDE 2 MEQ/ML IV SOLN
80.0000 meq | INTRAVENOUS | Status: DC
  Filled 2017-06-07: qty 40

## 2017-06-07 MED ORDER — TRANEXAMIC ACID (OHS) BOLUS VIA INFUSION
15.0000 mg/kg | INTRAVENOUS | Status: AC
  Administered 2017-06-08: 1152 mg via INTRAVENOUS
  Filled 2017-06-07: qty 1152

## 2017-06-07 MED ORDER — SODIUM CHLORIDE 0.9 % IV SOLN
INTRAVENOUS | Status: DC
  Filled 2017-06-07: qty 30

## 2017-06-07 MED ORDER — DEXTROSE 5 % IV SOLN
750.0000 mg | INTRAVENOUS | Status: DC
  Filled 2017-06-07: qty 750

## 2017-06-07 MED ORDER — VANCOMYCIN HCL 10 G IV SOLR
1250.0000 mg | INTRAVENOUS | Status: DC
  Filled 2017-06-07 (×2): qty 1250

## 2017-06-07 MED ORDER — DOPAMINE-DEXTROSE 3.2-5 MG/ML-% IV SOLN
0.0000 ug/kg/min | INTRAVENOUS | Status: DC
  Filled 2017-06-07: qty 250

## 2017-06-07 NOTE — H&P (Signed)
[address]       [place] [address]             [phone]      Cardiothoracic Surgery Admission History and Physical   PCP is Birdie Riddle Aundra Millet, MD Referring Provider is Skeet Latch, MD      Chief Complaint  Patient presents with  . Aortic Stenosis        HPI:  The patient is a 66 year old gentleman with hypertension, hyperlipidemia as well as known aortic stenosis by echo in 09/2016 with a mean gradient of 14 mm Hg and a DI of 0.2. His valve was severely calcified and it was not possible to tell if it was bicuspid or not. LVEF was 30-35% with a moderately dilated LV with inferior hypokinesis and grade 1 diastolic dysfunction. He had a cath on 11/03/2016 showing mild non-obstructive disease with an LVEF of 45-50% and normal filling pressures. A repeat echo on 01/17/2017 showed a severe thickened and calcified aortic valve with poor mobility and a mean gradient of 16 mm Hg. The LVEF was still 30-35%. A dobutamine stress echo on 02/22/2017 showed an increase in the mean gradient from 16 to 38 mm Hg at peak stress with an improvement in the LVEF from 35-40% to 55-60% at peak stress. He reports being asymptomatic walking several miles 3-4 times per week with no chest pain or shortness of breath. He works around his house and yard without difficulty and his wife is with him today and says he really has not had any symptoms.      Past Medical History:  Diagnosis Date  . Colon cancer ([place])   . Family history of adverse reaction to anesthesia    pt states his mother had 2 episodes of hyponatremia following anesthesia   . GERD (gastroesophageal reflux disease)   . Hyperlipidemia 10/25/2016  . Hypertension    pt is currently not taking any medications; pt states is borderline  . Melanoma ([place])   . Mild aortic stenosis 10/25/2016  . Perforated sigmoid colon ([place])   . PVC's (premature ventricular contractions) 10/25/2016  . Tinnitus   . Wears  glasses          Past Surgical History:  Procedure Laterality Date  . COLOSTOMY CLOSURE N/A 08/18/2016   Procedure: COLOSTOMY CLOSURE OPEN PROCEDURE;  Surgeon: Armandina Gemma, MD;  Location: [facility];  Service: General;  Laterality: N/A;  . LAPAROTOMY N/A 04/25/2016   Procedure: EXPLORATORY LAPAROTOMY WITH SIGMOID COLECTOMY, COLOSTOMY;  Surgeon: Armandina Gemma, MD;  Location: [facility];  Service: General;  Laterality: N/A;  . LEFT HEART CATH AND CORONARY ANGIOGRAPHY N/A 11/03/2016   Procedure: Left Heart Cath and Coronary Angiography;  Surgeon: Burnell Blanks, MD;  Location: [facility];  Service: Cardiovascular;  Laterality: N/A;  . MELANOMA EXCISION    . PARTIAL COLECTOMY Right 08/18/2016   Procedure: RIGHT COLECTOMY;  Surgeon: Armandina Gemma, MD;  Location: [facility];  Service: General;  Laterality: Right;         Family History  Problem Relation Age of Onset  . Dementia Mother   . Kidney disease Mother   . Vascular Disease Mother   . CAD Mother   . Heart attack Father   . Alcohol abuse Father   . Cirrhosis Father   . Stroke Maternal Grandmother   . Stroke Paternal Grandmother     Social History        Social History  Substance Use Topics   . Smoking status: Never Smoker   . Smokeless tobacco: Never Used   . Alcohol use Yes     Comment: 2-3 glasses of wine daily            Current Outpatient Prescriptions  Medication Sig Dispense Refill  . aspirin EC 81 MG tablet Take 81 mg by mouth daily.    . colestipol (COLESTID) 1 g tablet Take 1 g by mouth daily as needed.    . ezetimibe (ZETIA) 10 MG tablet Take 1 tablet (10 mg total) by mouth daily. 90 tablet 3  . metoprolol succinate (TOPROL-XL) 25 MG 24 hr tablet Take 1 tablet (25 mg total) by mouth daily. Take with or immediately following a meal. 90 tablet 3  . valsartan (DIOVAN) 40 MG tablet Take 1 tablet (40 mg total) by mouth daily. 90 tablet 1   No current facility-administered medications  for this visit.     No Known Allergies  Review of Systems  Constitutional: Negative for activity change, appetite change, chills, fatigue and fever.  HENT: Positive for hearing loss.        Sees his dentist every 6 months  Eyes: Negative.   Respiratory: Negative for cough, chest tightness and shortness of breath.   Cardiovascular: Positive for palpitations. Negative for chest pain and leg swelling.  Gastrointestinal: Negative.   Genitourinary: Negative.   Musculoskeletal: Negative.   Skin: Negative.   Allergic/Immunologic: Negative.   Neurological: Negative for dizziness, syncope and light-headedness.  Hematological: Negative.   Psychiatric/Behavioral: Negative.     BP 136/81 (BP Location: Right Arm, Patient Position: Sitting, Cuff Size: Large)   Pulse 66   Resp 16   Ht 5\' 6"  (1.676 m)   Wt 161 lb 3.2 oz (73.1 kg)   SpO2 96% Comment: ON RA  BMI 26.02 kg/m  Physical Exam  Constitutional: He is oriented to person, place, and time. He appears well-developed and well-nourished. No distress.  HENT:  Head: Normocephalic and atraumatic.  Mouth/Throat: Oropharynx is clear and moist.  Eyes: Pupils are equal, round, and reactive to light. Conjunctivae and EOM are normal.  Neck: Normal range of motion. Neck supple. No JVD present. No thyromegaly present.  Cardiovascular: Normal rate, regular rhythm and intact distal pulses.   Murmur heard. 3/6 systolic murmur RSB  Pulmonary/Chest: Effort normal and breath sounds normal. No respiratory distress. He has no rales.  Abdominal: Soft. Bowel sounds are normal. He exhibits mass. He exhibits no distension. There is no tenderness.  Musculoskeletal: Normal range of motion. He exhibits no edema.  Lymphadenopathy:    He has no cervical adenopathy.  Neurological: He is alert and oriented to person, place, and time. He has normal strength. No cranial nerve deficit or sensory deficit.     Diagnostic Tests:  Physicians   Panel  Physicians Referring Physician Case Authorizing Physician  Burnell Blanks, MD (Primary)    Procedures   Left Heart Cath and Coronary Angiography  Conclusion     Mid RCA to Dist RCA lesion, 10 %stenosed.  RPDA lesion, 10 %stenosed.  Ost Cx to Prox Cx lesion, 40 %stenosed.  Ost Ramus to Ramus lesion, 20 %stenosed.  Mid LAD lesion, 20 %stenosed.  Prox LAD lesion, 30 %stenosed.  The left ventricular ejection fraction is 45-50% by visual estimate.  There is mild left ventricular systolic dysfunction.  LV end diastolic pressure is normal.  There is no mitral valve regurgitation.  1. Mild to moderate non-obstructive CAD 2. Mild  LV systolic dysfunction by LV gram, estimated around 45-50%.  3. Normal filling pressures 4. Non-ischemic cardiomyopathy  Recommendations: Medical management of CAD and non-ischemic cardiomyopathy   Indications   Non-ischemic cardiomyopathy (HCC) [I42.8 (ICD-10-CM)]  Coronary artery disease involving native coronary artery of native heart without angina pectoris [I25.10 (ICD-10-CM)]  Procedural Details/Technique   Technical Details Indication: 66 yo male with h/o HTN and strong FH of CAD with recent finding of mild to moderate AS and LV systolic dysfunction. No chest pain or dyspnea. Cardiac cath to exclude CAD given LV dysfunction.   Procedure: The risks, benefits, complications, treatment options, and expected outcomes were discussed with the patient. The patient and/or family concurred with the proposed plan, giving informed consent. The patient was brought to the cath lab after IV hydration was begun and oral premedication was given. The patient was further sedated with Versed and Fentanyl. The right wrist was prepped and draped in a sterile fashion. 1% lidocaine was used for local anesthesia. Using the modified Seldinger access technique, a 5 French sheath was placed in the right radial artery. 3 mg Verapamil was given through the  sheath. 3500 units IV heparin was given. Standard diagnostic catheters were used to perform selective coronary angiography. A pigtail catheter was used to perform a left ventricular angiogram. The sheath was removed from the right radial artery and a Terumo hemostasis band was applied at the arteriotomy site on the right wrist.     Estimated blood loss <50 mL.  During this procedure the patient was administered the following to achieve and maintain moderate conscious sedation: Versed 1 mg, Fentanyl 25 mcg, while the patient's heart rate, blood pressure, and oxygen saturation were continuously monitored. The period of conscious sedation was 22 minutes, of which I was present face-to-face 100% of this time.    Complications   Complications documented before study signed (11/03/2016 9:38 AM EST)    LEFT HEART CATH AND CORONARY ANGIOGRAPHY   None Documented by Burnell Blanks, MD 11/03/2016 9:37 AM EST  Time Range: Intra-procedure      Coronary Findings   Dominance: Right  Left Anterior Descending  Prox LAD lesion, 30% stenosed.  Mid LAD lesion, 20% stenosed.  First Diagonal Branch  Vessel is small in size.  First Septal Branch  Vessel is small in size.  Second Diagonal Branch  Vessel is small in size.  Second Septal Branch  Vessel is small in size.  Third Diagonal Branch  Vessel is small in size.  Ramus Intermedius  Vessel is moderate in size.  Ost Ramus to Ramus lesion, 20% stenosed.  Left Circumflex  Vessel is large.  Ost Cx to Prox Cx lesion, 40% stenosed. The lesion is calcified.  First Obtuse Marginal Branch  Vessel is small in size.  Second Obtuse Marginal Branch  Vessel is moderate in size.  Third Obtuse Marginal Branch  Vessel is moderate in size.  Right Coronary Artery  Mid RCA to Dist RCA lesion, 10% stenosed.  Right Posterior Descending Artery  RPDA lesion, 10% stenosed.  Wall Motion              Left Heart   Left Ventricle The left  ventricular size is normal. There is mild left ventricular systolic dysfunction. LV end diastolic pressure is normal. The left ventricular ejection fraction is 45-50% by visual estimate. There are LV function abnormalities due to segmental dysfunction. There is no evidence of mitral regurgitation.    Coronary Diagrams   Diagnostic Diagram  Implants        No implant documentation for this case.  PACS Images   Show images for Cardiac catheterization   Link to Procedure Log   Procedure Log    Hemo Data    Most Recent Value  AO Systolic Pressure 696 mmHg  AO Diastolic Pressure 82 mmHg  AO Mean 789 mmHg  LV Systolic Pressure 381 mmHg  LV Diastolic Pressure 7 mmHg  LV EDP 11 mmHg  Arterial Occlusion Pressure Extended Systolic Pressure 017 mmHg  Arterial Occlusion Pressure Extended Diastolic Pressure 80 mmHg  Arterial Occlusion Pressure Extended Mean Pressure 110 mmHg  Left Ventricular Apex Extended Systolic Pressure 510 mmHg  Left Ventricular Apex Extended Diastolic Pressure 4 mmHg  Left Ventricular Apex Extended EDP Pressure 14 mmHg    *[facility]* [address], [place] [address] [phone]  ------------------------------------------------------------------- Transthoracic Echocardiography  Patient: Malyk, Girouard MR #: [id number] Study Date: 01/17/2017 Gender: M Age: 49 Height: 167.6 cm Weight: 74 kg BSA: 1.87 m^2 Pt. Status: Room:  SONOGRAPHER Oletta Lamas, Will REFERRING [name] ATTENDING Sanda Klein, MD PERFORMING [place], Outpatient ORDERING Skeet Latch, MD [place], MD  cc:  ------------------------------------------------------------------- LV EF: 30% - 35%  ------------------------------------------------------------------- Indications:  (I50.22).  ------------------------------------------------------------------- History: PMH: AS. PVC. Acquired from the patient and from the patient&'s chart. Coronary artery disease. Congestive heart failure. Risk factors: Hypertension. Dyslipidemia.  ------------------------------------------------------------------- Study Conclusions  - Left ventricle: The cavity size was normal. Wall thickness was normal. Systolic function was moderately to severely reduced. The estimated ejection fraction was in the range of 30% to 35%. Moderate diffuse hypokinesis with no identifiable regional variations. Doppler parameters are consistent with abnormal left ventricular relaxation (grade 1 diastolic dysfunction). - Ventricular septum: Septal motion showed paradox. These changes are consistent with a left bundle branch block. - Aortic valve: Possibly bicuspid; severely thickened, severely calcified leaflets; fusion of the right-noncoronary commissure. Anterior cusp mobility was severely restricted. Transvalvular velocity was increased, due to low cardiac output. There was moderate to severe stenosis. There was mild regurgitation. - Right ventricle: Systolic function was mildly reduced.  Impressions:  - Suspect low gradient severe aortic stenosis.  Recommendations: Consider dobutamine echo.  ------------------------------------------------------------------- Study data: Comparison was made to the study of 10/12/2016. Study status: Routine. Procedure: The patient reported no pain pre or post test. Transthoracic echocardiography for left ventricular function evaluation and for assessment of valvular function. Image quality was adequate. Study completion: There were no complications. Transthoracic echocardiography. M-mode, complete 2D, spectral Doppler, and color Doppler. Birthdate: Patient birthdate: [birth year]. Age: Patient is 66 yr  old. Sex: Gender: male. BMI: 26.4 kg/m^2. Blood pressure: 135/83 Patient status: Outpatient. Study date: Study date: 01/17/2017. Study time: 09:53 AM. Location: [facility]  -------------------------------------------------------------------  ------------------------------------------------------------------- Left ventricle: The cavity size was normal. Wall thickness was normal. Systolic function was moderately to severely reduced. The estimated ejection fraction was in the range of 30% to 35%. Moderate diffuse hypokinesis with no identifiable regional variations. Doppler parameters are consistent with abnormal left ventricular relaxation (grade 1 diastolic dysfunction). There was no evidence of elevated ventricular filling pressure by Doppler parameters.  ------------------------------------------------------------------- Aortic valve: Possibly bicuspid; severely thickened, severely calcified leaflets; fusion of the right-noncoronary commissure. Anterior cusp mobility was severely restricted. Doppler: Transvalvular velocity was increased, due to low cardiac output. There was moderate to severe stenosis. There was mild regurgitation. VTI ratio of LVOT to aortic valve: 0.19. Valve area (VTI): 1.09 cm^2. Indexed valve area (VTI): 0.58 cm^2/m^2.  Peak velocity ratio of LVOT to aortic valve: 0.19. Valve area (Vmax): 1.11 cm^2. Indexed valve area (Vmax): 0.59 cm^2/m^2. Mean velocity ratio of LVOT to aortic valve: 0.2. Valve area (Vmean): 1.14 cm^2. Indexed valve area (Vmean): 0.61 cm^2/m^2. Mean gradient (S): 16 mm Hg. Peak gradient (S): 31 mm Hg.  ------------------------------------------------------------------- Mitral valve: Structurally normal valve. Leaflet separation was normal. Doppler: Transvalvular velocity was within the normal range. There was no evidence for stenosis. There was  trivial regurgitation.  ------------------------------------------------------------------- Left atrium: The atrium was normal in size.  ------------------------------------------------------------------- Right ventricle: The cavity size was normal. Systolic function was mildly reduced.  ------------------------------------------------------------------- Ventricular septum: Septal motion showed paradox. These changes are consistent with a left bundle branch block.  ------------------------------------------------------------------- Pulmonic valve: Poorly visualized.  ------------------------------------------------------------------- Tricuspid valve: Structurally normal valve. Leaflet separation was normal. Doppler: Transvalvular velocity was within the normal range. There was trivial regurgitation.  ------------------------------------------------------------------- Pulmonary artery: Systolic pressure was within the normal range.  ------------------------------------------------------------------- Right atrium: The atrium was normal in size.  ------------------------------------------------------------------- Pericardium: There was no pericardial effusion.  ------------------------------------------------------------------- Measurements  Left ventricle Value Reference LV ID, ED, PLAX chordal (H) 52.5 mm 43 - 52 LV ID, ES, PLAX chordal (H) 42 mm 23 - 38 LV fx shortening, PLAX chordal (L) 20 % >=29 LV PW thickness, ED 9.78 mm --------- IVS/LV PW ratio, ED 1.04 <=1.3 Stroke volume, 2D 43 ml --------- Stroke volume/bsa, 2D 23 ml/m^2 --------- LV ejection fraction, 1-p A4C 46 % --------- LV end-diastolic  volume, 2-p 098 ml --------- LV end-systolic volume, 2-p 64 ml --------- LV ejection fraction, 2-p 40 % --------- Stroke volume, 2-p 43 ml --------- LV end-diastolic volume/bsa, 2-p 57 ml/m^2 --------- LV end-systolic volume/bsa, 2-p 34 ml/m^2 --------- Stroke volume/bsa, 2-p 23 ml/m^2 --------- LV e&', lateral 5.65 cm/s --------- LV E/e&', lateral 11.1 --------- LV e&', medial 6.34 cm/s --------- LV E/e&', medial 9.89 --------- LV e&', average 6 cm/s --------- LV E/e&', average 10.46 ---------  Ventricular septum Value Reference IVS thickness, ED 10.2 mm ---------  LVOT Value Reference LVOT ID, S 27.2 mm --------- LVOT area 5.81 cm^2 --------- LVOT ID 21 mm --------- LVOT peak velocity, S 53.4 cm/s --------- LVOT mean velocity, S 36.8 cm/s --------- LVOT VTI, S 12.3 cm --------- LVOT peak gradient, S 1 mm Hg --------- Stroke volume (SV), LVOT DP 71.5 ml --------- Stroke index (SV/bsa), LVOT DP 38.2 ml/m^2 ---------  Aortic valve Value Reference Aortic valve peak velocity, S 279 cm/s --------- Aortic valve mean velocity, S 187 cm/s --------- Aortic valve VTI, S  65.8 cm --------- Aortic mean gradient, S 16 mm Hg --------- Aortic peak gradient, S 31 mm Hg --------- VTI ratio, LVOT/AV 0.19 --------- Aortic valve area, VTI 1.09 cm^2 --------- Aortic valve area/bsa, VTI 0.58 cm^2/m^2 --------- Velocity ratio, peak, LVOT/AV 0.19 --------- Aortic valve area, peak velocity 1.11 cm^2 --------- Aortic valve area/bsa, peak 0.59 cm^2/m^2 --------- velocity Velocity ratio, mean, LVOT/AV 0.2 --------- Aortic valve area, mean velocity 1.14 cm^2 --------- Aortic valve area/bsa, mean 0.61 cm^2/m^2 --------- velocity Aortic regurg pressure half-time 591 ms ---------  Aorta Value Reference Aortic root ID, ED 38 mm --------- Ascending aorta ID, A-P, S 37 mm ---------  Left atrium Value Reference LA ID, A-P, ES 31 mm --------- LA ID/bsa, A-P 1.66 cm/m^2 <=2.2 LA volume, S 47 ml --------- LA volume/bsa, S 25.1 ml/m^2 --------- LA volume, ES, 1-p A4C 35 ml --------- LA volume/bsa, ES, 1-p A4C 18.7 ml/m^2 --------- LA volume, ES, 1-p A2C 53 ml --------- LA volume/bsa,  ES, 1-p A2C 28.3 ml/m^2 ---------  Mitral valve Value Reference Mitral E-wave peak velocity 62.7 cm/s --------- Mitral A-wave peak velocity 91.3 cm/s --------- Mitral  deceleration time (H) 264 ms 150 - 230 Mitral E/A ratio, peak 0.7 ---------  Pulmonary arteries Value Reference PA pressure, S, DP 23 mm Hg <=30  Tricuspid valve Value Reference Tricuspid regurg peak velocity 222 cm/s --------- Tricuspid peak RV-RA gradient 20 mm Hg ---------  Systemic veins Value Reference Estimated CVP 3 mm Hg ---------  Right ventricle Value Reference RV pressure, S, DP 23 mm Hg <=30 RV s&', lateral, S 11.5 cm/s ---------  Legend: (L) and (H) mark values outside specified reference range.  ------------------------------------------------------------------- Prepared and Electronically Authenticated by  Sanda Klein, MD 2018-05-08T16:20:04  [facility] [address], [place] [address] [phone]  ------------------------------------------------------------------- Stress Echocardiography  Patient: Gurjit, Loconte MR #: [id number] Study Date: 02/22/2017 Gender: M Age: 21 Height: 167.6 cm Weight: 75.3 kg BSA: 1.89 m^2 Pt. Status: Room:  ATTENDING Candee Furbish, M.D. SONOGRAPHER Marygrace Drought, RCS PERFORMING [place], Outpatient ORDERING Skeet Latch, MD REFERRING Skeet Latch, MD  cc: Dr. Oval Linsey  -------------------------------------------------------------------  ------------------------------------------------------------------- Indications: Aortic valve Disorder  (I35.0).  ------------------------------------------------------------------- History: PMH: Coronary artery disease. Congestive heart failure. Risk factors: Hypertension. Dyslipidemia.  ------------------------------------------------------------------- Study Conclusions  - Aortic valve: Severely thickened, severely calcified leaflets. Valve mobility was restricted. There was mild regurgitation. Peak velocity (S): 388 cm/s. Mean gradient (S): 38 mm Hg. Valve area (VTI): 0.96 cm^2. Valve area (Vmax): 0.47 cm^2. Valve area (Vmean): 0.46 cm^2. - Stress ECG conclusions: There were no stress arrhythmias or conduction abnormalities. The stress ECG was negative for ischemia. - Staged echo: There was no echocardiographic evidence for stress-induced ischemia.  Impressions:  - Mild aortic stenosis at rest augments to moderate to severe aortic stenosis with infusion of 20 mcg/kg/min of dobutamine.  ------------------------------------------------------------------- Study data: Study status: Routine. Consent: The risks, benefits, and alternatives to the procedure were explained to the patient and informed consent was obtained. Procedure: The patient reported no pain pre or post test. Initial setup. The patient was brought to the laboratory. A baseline ECG was recorded. Surface ECG leads and automatic cuff blood pressure measurements were monitored. Dobutamine stress test. Stress testing was performed, with dobutamine infusion from 5 to 20 mcg/kg/min by 10 mcg/kg/min increments. The infusion was terminated due to maximal dose administration. Transthoracic stress echocardiography for Aortic Stenosis. Image quality was adequate. Images were captured at baseline, low dose, peak dose, and recovery. Study completion: The patient tolerated the procedure well. There were no complications. Dobutamine. Stress echocardiography. 2D. Birthdate: Patient  birthdate: June 11, 1951. Age: Patient is 66 yr old. Sex: Gender: male. BMI: 26.8 kg/m^2. Blood pressure: 147/93 Patient status: Outpatient. Study date: Study date: 02/22/2017. Study time: 02:54 PM.  -------------------------------------------------------------------  ------------------------------------------------------------------- Aortic valve: Severely thickened, severely calcified leaflets. Valve mobility was restricted. Doppler: There was mild regurgitation. VTI ratio of LVOT to aortic valve: 0.31. Valve area (VTI): 0.96 cm^2. Indexed valve area (VTI): 0.51 cm^2/m^2. Peak velocity ratio of LVOT to aortic valve: 0.15. Valve area (Vmax): 0.47 cm^2. Indexed valve area (Vmax): 0.25 cm^2/m^2. Mean velocity ratio of LVOT to aortic valve: 0.15. Valve area (Vmean): 0.46 cm^2. Indexed valve area (Vmean): 0.24 cm^2/m^2. Mean gradient (S): 38 mm Hg. Peak gradient (S): 60 mm Hg.  ------------------------------------------------------------------- Baseline ECG: Normal sinus rhythm. Frequent PVCs. Cannot rule out prior anteroseptal infarct.  ------------------------------------------------------------------- Stress protocol:  +-----------------------+--+------------+--------+--------+ !Stage !HR!BP (mmHg) !Symptoms!Comments! +-----------------------+--+------------+--------+--------+ !Baseline !66!147/93 (111)!None !--------! +-----------------------+--+------------+--------+--------+ !Dobutamine  5 ug/kg/min !70!------------!--------!PVC. ! +-----------------------+--+------------+--------+--------+ !Dobutamine 10 ug/kg/min!60!138/91 (107)!--------!PVCs. ! +-----------------------+--+------------+--------+--------+ !Dobutamine 20 ug/kg/min!93!147/82 (104)!--------!--------! +-----------------------+--+------------+--------+--------+  ------------------------------------------------------------------- Stress results:  Maximal heart rate during stress was 93 bpm (60% of maximal predicted heart rate). The maximal predicted heart rate was 154 bpm.The target heart rate was achieved. The heart rate response to stress was normal. There was a normal resting blood pressure with an appropriate response to stress. Normal blood pressure response to dobutamine. The rate-pressure product for the peak heart rate and blood pressure was 13671 mm Hg/min. The patient experienced no chest pain during stress.  ------------------------------------------------------------------- Stress ECG: There were no stress arrhythmias or conduction abnormalities. Normal sinus rhythm with frequent PVCs. 38mm ST elevation in leads V1, V2, and V3 beginning with 20 mcg/kg/min of dobutamine and resolving within 1 minute of rest. 21mm upsloping ST depressions in leads II, III and aVF. The stress ECG was negative for ischemia.  ------------------------------------------------------------------- Baseline:  - LV size was normal. - The estimated LV ejection fraction was 35-40%. - There was diffuse LV hypokinesis. - Mild aortic stenosis. Mean gradient 16 mmHg.  Low dose 10 ug/kg/min:  - LV size was normal and appropriately increased from the prior stage. - LV global systolic function was mildly reduced. The estimated LV ejection fraction was 45-50%. - Mild aortic stenosis. Mean gradient 20 mmHg. Mild aortic regurgitation.  Peak stress 20 ug/kg/min:  - LV size was normal, increased from the prior stage, and increased from baseline. - LV global systolic function was normal. The estimated LV ejection fraction was 55-60%. - Moderate to severe aortic stenosis. Peak velocity 3.9 m/s. Mean gradient 38 mmHg.  Recovery:  ------------------------------------------------------------------- Stress echo results: Left ventricular ejection fraction was normal at rest and with stress. There was no  echocardiographic evidence for stress-induced ischemia.  ------------------------------------------------------------------- Measurements  Left ventricle Value Stroke volume, 2D 66 ml Stroke volume/bsa, 2D 35 ml/m^2  LVOT Value LVOT ID, S 20 mm LVOT area 3.14 cm^2 LVOT peak velocity, S 58.4 cm/s LVOT mean velocity, S 41.7 cm/s LVOT VTI, S 21.1 cm  Aortic valve Value Aortic valve peak velocity, S 388 cm/s Aortic valve mean velocity, S 285 cm/s Aortic valve VTI, S 68.8 cm Aortic mean gradient, S 38 mm Hg Aortic peak gradient, S 60 mm Hg VTI ratio, LVOT/AV 0.31 Aortic valve area, VTI 0.96 cm^2 Aortic valve area/bsa, VTI 0.51 cm^2/m^2 Velocity ratio, peak, LVOT/AV 0.15 Aortic valve area, peak velocity 0.47 cm^2 Aortic valve area/bsa, peak velocity 0.25 cm^2/m^2 Velocity ratio, mean, LVOT/AV 0.15 Aortic valve area, mean velocity 0.46 cm^2 Aortic valve area/bsa, mean velocity 0.24 cm^2/m^2 Aortic regurg pressure half-time 601 ms  Legend: (L) and (H) mark values outside specified reference range.  ------------------------------------------------------------------- Prepared and Electronically Authenticated by  Skeet Latch, MD 2018-06-13T21:13:03   Impression:  This 66 year old gentleman has stage C severe, low gradient, low ejection fraction asymptomatic aortic stenosis. I have personally reviewed his cath and echo studies. He has a severely calcified and thickened  aortic valve with poor mobility with a rise in the mean gradient from 16 to 38 with dobutamine and a significant improvement in his EF with dobutamine. Given his LV dysfunction I think it is best to proceed with AVR at this time to prevent further deterioration of LV function. His cath shows mild non-obstructive CAD. I discussed the alternatives of mechanical and tissue valves with the patient and his wife. He would like to use a tissue valve which I think is the best choice at his age. I  discussed the operative procedure with the patient and his wife including alternatives, benefits and risks; including but not limited to bleeding, blood transfusion, infection, stroke, myocardial infarction, heart block requiring a permanent pacemaker, organ dysfunction, and death.  [name] understands and agrees to proceed.     Plan:  AVR using a bioprosthetic valve.  [name], MD [facility] [phone]

## 2017-06-07 NOTE — Anesthesia Preprocedure Evaluation (Addendum)
Anesthesia Evaluation  Patient identified by MRN, date of birth, ID band Patient awake    Reviewed: Allergy & Precautions, NPO status , Patient's Chart, lab work & pertinent test results  History of Anesthesia Complications (+) Family history of anesthesia reaction  Airway Mallampati: II  TM Distance: >3 FB Neck ROM: Full    Dental  (+) Teeth Intact, Dental Advisory Given   Pulmonary    breath sounds clear to auscultation       Cardiovascular hypertension, + Valvular Problems/Murmurs AS  Rhythm:Regular Rate:Normal  Stress Echo 02/2017 - Aortic valve: Severely thickened, severely calcified leaflets. Valve mobility was restricted. There was mild regurgitation. Peak velocity (S): 388 cm/s. Mean gradient (S): 38 mm Hg. Valve area  (VTI): 0.96 cm^2. Valve area (Vmax): 0.47 cm^2. Valve area  (Vmean): 0.46 cm^2. - Stress ECG conclusions: There were no stress arrhythmias or  conduction abnormalities. The stress ECG was negative for ischemia. - Staged echo: There was no echocardiographic evidence for stress-induced ischemia. Impressions: - Mild aortic stenosis at rest augments to moderate to severe aortic stenosis with infusion of 20 mcg/kg/min of dobutamine.  TTE 01/2017 - Left ventricle: Estimated ejection fraction was in the range of 30% to 35%. Moderate diffuse hypokinesis with no identifiable regional variations. Grade 1 diastolic dysfunction. - Ventricular septum: Septal motion showed paradox. These changes  are consistent with a left bundle branch block. - Aortic valve: Possibly bicuspid; severely thickened, severely  calcified leaflets; fusion of the right-noncoronary commissure.  Anterior cusp mobility was severely restricted. Transvalvular  velocity was increased, due to low cardiac output. There was  moderate to severe stenosis. There was mild regurgitation. - Right ventricle: Systolic function was mildly reduced.  Cath 10/2016 - Mild  to moderate non-obstructive CAD, Non-ischemic cardiomyopathy   Neuro/Psych negative neurological ROS     GI/Hepatic Neg liver ROS, PUD, GERD  ,  Endo/Other  negative endocrine ROS  Renal/GU negative Renal ROS     Musculoskeletal negative musculoskeletal ROS (+)   Abdominal   Peds  Hematology negative hematology ROS (+)   Anesthesia Other Findings Hx colon cancer  Reproductive/Obstetrics                             Anesthesia Physical  Anesthesia Plan  ASA: IV  Anesthesia Plan: General   Post-op Pain Management:    Induction: Intravenous  PONV Risk Score and Plan: 3 and Midazolam and Treatment may vary due to age or medical condition  Airway Management Planned: Oral ETT  Additional Equipment: Arterial line, CVP, PA Cath and TEE  Intra-op Plan:   Post-operative Plan: Post-operative intubation/ventilation  Informed Consent: I have reviewed the patients History and Physical, chart, labs and discussed the procedure including the risks, benefits and alternatives for the proposed anesthesia with the patient or authorized representative who has indicated his/her understanding and acceptance.   Dental advisory given  Plan Discussed with: CRNA  Anesthesia Plan Comments:         Anesthesia Quick Evaluation

## 2017-06-07 NOTE — Progress Notes (Signed)
Anesthesia Chart Review: Patient is a 66 year old male scheduled for AVR on 06/08/17 by Dr. 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   PCP - Dr. Carmelia Bake Cardiologist - Dr. Skeet Latch  Meds include ASA 81 mg, Zetia, Pepcid, losartan, melatonin, Toprol XL.   BP (!) 161/73   Pulse 62   Temp 36.6 C   Resp 18   Ht 5\' 6"  (1.676 m)   Wt 169 lb 4.8 oz (76.8 kg)   SpO2 100%   BMI 27.33 kg/m   Cardiac cath 11/03/16:  Mid RCA to Dist RCA lesion, 10 %stenosed.  RPDA lesion, 10 %stenosed.  Ost Cx to Prox Cx lesion, 40 %stenosed.  Ost Ramus to Ramus lesion, 20 %stenosed.  Mid LAD lesion, 20 %stenosed.  Prox LAD lesion, 30 %stenosed.  The left ventricular ejection fraction is 45-50% by visual estimate.  There is mild left ventricular systolic dysfunction.  LV end diastolic pressure is normal.  There is no mitral valve regurgitation.  1. Mild to moderate non-obstructive CAD 2. Mild LV systolic dysfunction by LV gram, estimated around 45-50%.  3. Normal filling pressures 4. Non-ischemic cardiomyopathy Recommendations: Medical management of CAD and non-ischemic cardiomyopathy  Stress echo 02/22/17: Impressions: - Mild aortic stenosis at rest augments to moderate to severe   aortic stenosis with infusion of 20 mcg/kg/min of dobutamine.  Echo 01/17/17: Study Conclusions - Left ventricle: The cavity size was normal. Wall thickness was   normal. Systolic function was moderately to severely reduced. The   estimated ejection fraction was in the range of 30% to 35%.   Moderate diffuse hypokinesis with no identifiable regional   variations. Doppler parameters are consistent with abnormal left   ventricular relaxation (grade 1  diastolic dysfunction). - Ventricular septum: Septal motion showed paradox. These changes   are consistent with a left bundle branch block. - Aortic valve: Possibly bicuspid; severely thickened, severely   calcified leaflets; fusion of the right-noncoronary commissure.   Anterior cusp mobility was severely restricted. Transvalvular   velocity was increased, due to low cardiac output. There was   moderate to severe stenosis. There was mild regurgitation. - Right ventricle: Systolic function was mildly reduced. Impressions: - Suspect low gradient severe aortic stenosis. Recommendations:  Consider dobutamine echo.  Carotid U/S 06/06/17: Summary: - Bilateral - No evidence of extracranial ICA stenosis. Vertebral   artery flow is antegrade.  Preoperative EKG, CXR, cardiac MRI, PFTs, labs reviewed.   If no acute changes then I anticipate that he can proceed as planned.  George Hugh [facility]/Anesthesiology Phone [phone] 06/07/2017 9:57 AM

## 2017-06-08 ENCOUNTER — Inpatient Hospital Stay (HOSPITAL_COMMUNITY): Payer: Medicare Other | Admitting: Vascular Surgery

## 2017-06-08 ENCOUNTER — Inpatient Hospital Stay (HOSPITAL_COMMUNITY): Payer: Medicare Other | Admitting: Certified Registered Nurse Anesthetist

## 2017-06-08 ENCOUNTER — Inpatient Hospital Stay (HOSPITAL_COMMUNITY): Payer: Medicare Other

## 2017-06-08 ENCOUNTER — Inpatient Hospital Stay (HOSPITAL_COMMUNITY)
Admission: RE | Admit: 2017-06-08 | Discharge: 2017-06-14 | DRG: 220 | Disposition: A | Discharge status: Home / Self Care | Payer: Medicare Other | Source: Ambulatory Visit | Attending: Surgery | Admitting: Surgery

## 2017-06-08 ENCOUNTER — Encounter (HOSPITAL_COMMUNITY)
Admission: RE | Disposition: A | Discharge status: Home / Self Care | Payer: Self-pay | Source: Ambulatory Visit | Attending: Surgery

## 2017-06-08 DIAGNOSIS — I110 Hypertensive heart disease with heart failure: Secondary | ICD-10-CM | POA: Diagnosis not present

## 2017-06-08 DIAGNOSIS — I4892 Unspecified atrial flutter: Secondary | ICD-10-CM | POA: Diagnosis not present

## 2017-06-08 DIAGNOSIS — I428 Other cardiomyopathies: Secondary | ICD-10-CM | POA: Diagnosis not present

## 2017-06-08 DIAGNOSIS — Z9049 Acquired absence of other specified parts of digestive tract: Secondary | ICD-10-CM

## 2017-06-08 DIAGNOSIS — K219 Gastro-esophageal reflux disease without esophagitis: Secondary | ICD-10-CM | POA: Diagnosis present

## 2017-06-08 DIAGNOSIS — I350 Nonrheumatic aortic (valve) stenosis: Secondary | ICD-10-CM | POA: Diagnosis not present

## 2017-06-08 DIAGNOSIS — I471 Supraventricular tachycardia: Secondary | ICD-10-CM | POA: Diagnosis not present

## 2017-06-08 DIAGNOSIS — Z79899 Other long term (current) drug therapy: Secondary | ICD-10-CM | POA: Diagnosis not present

## 2017-06-08 DIAGNOSIS — I2510 Atherosclerotic heart disease of native coronary artery without angina pectoris: Secondary | ICD-10-CM | POA: Diagnosis not present

## 2017-06-08 DIAGNOSIS — I5042 Chronic combined systolic (congestive) and diastolic (congestive) heart failure: Secondary | ICD-10-CM | POA: Diagnosis not present

## 2017-06-08 DIAGNOSIS — Z85038 Personal history of other malignant neoplasm of large intestine: Secondary | ICD-10-CM | POA: Diagnosis not present

## 2017-06-08 DIAGNOSIS — I484 Atypical atrial flutter: Secondary | ICD-10-CM | POA: Diagnosis not present

## 2017-06-08 DIAGNOSIS — Z8249 Family history of ischemic heart disease and other diseases of the circulatory system: Secondary | ICD-10-CM | POA: Diagnosis not present

## 2017-06-08 DIAGNOSIS — J9811 Atelectasis: Secondary | ICD-10-CM | POA: Diagnosis not present

## 2017-06-08 DIAGNOSIS — Z952 Presence of prosthetic heart valve: Secondary | ICD-10-CM

## 2017-06-08 DIAGNOSIS — I440 Atrioventricular block, first degree: Secondary | ICD-10-CM | POA: Diagnosis present

## 2017-06-08 DIAGNOSIS — Z7982 Long term (current) use of aspirin: Secondary | ICD-10-CM

## 2017-06-08 DIAGNOSIS — Z85820 Personal history of malignant melanoma of skin: Secondary | ICD-10-CM

## 2017-06-08 DIAGNOSIS — D62 Acute posthemorrhagic anemia: Secondary | ICD-10-CM | POA: Diagnosis not present

## 2017-06-08 DIAGNOSIS — I480 Paroxysmal atrial fibrillation: Secondary | ICD-10-CM | POA: Diagnosis not present

## 2017-06-08 DIAGNOSIS — Z9689 Presence of other specified functional implants: Secondary | ICD-10-CM

## 2017-06-08 DIAGNOSIS — K5900 Constipation, unspecified: Secondary | ICD-10-CM | POA: Diagnosis not present

## 2017-06-08 DIAGNOSIS — E785 Hyperlipidemia, unspecified: Secondary | ICD-10-CM | POA: Diagnosis present

## 2017-06-08 DIAGNOSIS — I429 Cardiomyopathy, unspecified: Secondary | ICD-10-CM | POA: Diagnosis not present

## 2017-06-08 DIAGNOSIS — J181 Lobar pneumonia, unspecified organism: Secondary | ICD-10-CM | POA: Diagnosis not present

## 2017-06-08 DIAGNOSIS — I358 Other nonrheumatic aortic valve disorders: Secondary | ICD-10-CM | POA: Diagnosis not present

## 2017-06-08 DIAGNOSIS — I10 Essential (primary) hypertension: Secondary | ICD-10-CM | POA: Diagnosis not present

## 2017-06-08 DIAGNOSIS — J90 Pleural effusion, not elsewhere classified: Secondary | ICD-10-CM | POA: Diagnosis not present

## 2017-06-08 HISTORY — PX: TEE WITHOUT CARDIOVERSION: SHX5443

## 2017-06-08 HISTORY — DX: Paroxysmal atrial fibrillation: I48.0

## 2017-06-08 HISTORY — PX: AORTIC VALVE REPLACEMENT: SHX41

## 2017-06-08 LAB — CBC
HCT: 33.8 % — ABNORMAL LOW (ref 39.0–52.0)
HEMATOCRIT: 34.4 % — AB (ref 39.0–52.0)
HEMOGLOBIN: 12 g/dL — AB (ref 13.0–17.0)
Hemoglobin: 11.7 g/dL — ABNORMAL LOW (ref 13.0–17.0)
MCH: 32.8 pg (ref 26.0–34.0)
MCH: 33 pg (ref 26.0–34.0)
MCHC: 34.6 g/dL (ref 30.0–36.0)
MCHC: 34.9 g/dL (ref 30.0–36.0)
MCV: 94.7 fL (ref 78.0–100.0)
MCV: 94.5 fL (ref 78.0–100.0)
PLATELETS: 116 10*3/uL — AB (ref 150–400)
Platelets: 144 10*3/uL — ABNORMAL LOW (ref 150–400)
RBC: 3.57 MIL/uL — ABNORMAL LOW (ref 4.22–5.81)
RBC: 3.64 MIL/uL — ABNORMAL LOW (ref 4.22–5.81)
RDW: 13.1 % (ref 11.5–15.5)
RDW: 12.9 % (ref 11.5–15.5)
WBC: 10.2 10*3/uL (ref 4.0–10.5)
WBC: 13.8 10*3/uL — AB (ref 4.0–10.5)

## 2017-06-08 LAB — POCT I-STAT 3, ART BLOOD GAS (G3+)
Acid-Base Excess: 5 mmol/L — ABNORMAL HIGH (ref 0.0–2.0)
Acid-base deficit: 1 mmol/L (ref 0.0–2.0)
Acid-base deficit: 1 mmol/L (ref 0.0–2.0)
BICARBONATE: 29.8 mmol/L — AB (ref 20.0–28.0)
Bicarbonate: 25.8 mmol/L (ref 20.0–28.0)
Bicarbonate: 23.2 mmol/L (ref 20.0–28.0)
Bicarbonate: 24.4 mmol/L (ref 20.0–28.0)
O2 SAT: 100 %
O2 SAT: 99 %
O2 SAT: 99 %
O2 Saturation: 99 %
PCO2 ART: 43.3 mmHg (ref 32.0–48.0)
PCO2 ART: 33.2 mmHg (ref 32.0–48.0)
PCO2 ART: 38.6 mmHg (ref 32.0–48.0)
PH ART: 7.449 (ref 7.350–7.450)
PH ART: 7.404 (ref 7.350–7.450)
PO2 ART: 138 mmHg — AB (ref 83.0–108.0)
TCO2: 31 mmol/L (ref 22–32)
TCO2: 27 mmol/L (ref 22–32)
TCO2: 24 mmol/L (ref 22–32)
TCO2: 26 mmol/L (ref 22–32)
pCO2 arterial: 43.8 mmHg (ref 32.0–48.0)
pH, Arterial: 7.445 (ref 7.350–7.450)
pH, Arterial: 7.372 (ref 7.350–7.450)
pO2, Arterial: 313 mmHg — ABNORMAL HIGH (ref 83.0–108.0)
pO2, Arterial: 148 mmHg — ABNORMAL HIGH (ref 83.0–108.0)
pO2, Arterial: 128 mmHg — ABNORMAL HIGH (ref 83.0–108.0)

## 2017-06-08 LAB — POCT I-STAT, CHEM 8
BUN: 14 mg/dL (ref 6–20)
BUN: 12 mg/dL (ref 6–20)
BUN: 14 mg/dL (ref 6–20)
BUN: 14 mg/dL (ref 6–20)
BUN: 13 mg/dL (ref 6–20)
BUN: 11 mg/dL (ref 6–20)
CALCIUM ION: 0.96 mmol/L — AB (ref 1.15–1.40)
CALCIUM ION: 1.01 mmol/L — AB (ref 1.15–1.40)
CALCIUM ION: 1.02 mmol/L — AB (ref 1.15–1.40)
CALCIUM ION: 1.05 mmol/L — AB (ref 1.15–1.40)
CHLORIDE: 104 mmol/L (ref 101–111)
CHLORIDE: 98 mmol/L — AB (ref 101–111)
CHLORIDE: 101 mmol/L (ref 101–111)
CHLORIDE: 103 mmol/L (ref 101–111)
CREATININE: 0.8 mg/dL (ref 0.61–1.24)
CREATININE: 0.8 mg/dL (ref 0.61–1.24)
CREATININE: 0.7 mg/dL (ref 0.61–1.24)
CREATININE: 0.7 mg/dL (ref 0.61–1.24)
Calcium, Ion: 1.16 mmol/L (ref 1.15–1.40)
Calcium, Ion: 1.16 mmol/L (ref 1.15–1.40)
Chloride: 103 mmol/L (ref 101–111)
Chloride: 100 mmol/L — ABNORMAL LOW (ref 101–111)
Creatinine, Ser: 0.7 mg/dL (ref 0.61–1.24)
Creatinine, Ser: 0.7 mg/dL (ref 0.61–1.24)
GLUCOSE: 84 mg/dL (ref 65–99)
GLUCOSE: 95 mg/dL (ref 65–99)
GLUCOSE: 123 mg/dL — AB (ref 65–99)
GLUCOSE: 104 mg/dL — AB (ref 65–99)
Glucose, Bld: 98 mg/dL (ref 65–99)
Glucose, Bld: 135 mg/dL — ABNORMAL HIGH (ref 65–99)
HCT: 26 % — ABNORMAL LOW (ref 39.0–52.0)
HCT: 32 % — ABNORMAL LOW (ref 39.0–52.0)
HCT: 28 % — ABNORMAL LOW (ref 39.0–52.0)
HCT: 26 % — ABNORMAL LOW (ref 39.0–52.0)
HEMATOCRIT: 36 % — AB (ref 39.0–52.0)
HEMATOCRIT: 33 % — AB (ref 39.0–52.0)
HEMOGLOBIN: 8.8 g/dL — AB (ref 13.0–17.0)
Hemoglobin: 12.2 g/dL — ABNORMAL LOW (ref 13.0–17.0)
Hemoglobin: 10.9 g/dL — ABNORMAL LOW (ref 13.0–17.0)
Hemoglobin: 8.8 g/dL — ABNORMAL LOW (ref 13.0–17.0)
Hemoglobin: 9.5 g/dL — ABNORMAL LOW (ref 13.0–17.0)
Hemoglobin: 11.2 g/dL — ABNORMAL LOW (ref 13.0–17.0)
POTASSIUM: 3.9 mmol/L (ref 3.5–5.1)
POTASSIUM: 3.9 mmol/L (ref 3.5–5.1)
Potassium: 3.6 mmol/L (ref 3.5–5.1)
Potassium: 4.5 mmol/L (ref 3.5–5.1)
Potassium: 4 mmol/L (ref 3.5–5.1)
Potassium: 4.2 mmol/L (ref 3.5–5.1)
SODIUM: 138 mmol/L (ref 135–145)
SODIUM: 137 mmol/L (ref 135–145)
SODIUM: 137 mmol/L (ref 135–145)
Sodium: 140 mmol/L (ref 135–145)
Sodium: 139 mmol/L (ref 135–145)
Sodium: 135 mmol/L (ref 135–145)
TCO2: 28 mmol/L (ref 22–32)
TCO2: 28 mmol/L (ref 22–32)
TCO2: 28 mmol/L (ref 22–32)
TCO2: 27 mmol/L (ref 22–32)
TCO2: 28 mmol/L (ref 22–32)
TCO2: 23 mmol/L (ref 22–32)

## 2017-06-08 LAB — GLUCOSE, CAPILLARY
GLUCOSE-CAPILLARY: 143 mg/dL — AB (ref 65–99)
GLUCOSE-CAPILLARY: 108 mg/dL — AB (ref 65–99)
GLUCOSE-CAPILLARY: 97 mg/dL (ref 65–99)
GLUCOSE-CAPILLARY: 110 mg/dL — AB (ref 65–99)
GLUCOSE-CAPILLARY: 132 mg/dL — AB (ref 65–99)
Glucose-Capillary: 114 mg/dL — ABNORMAL HIGH (ref 65–99)

## 2017-06-08 LAB — APTT: APTT: 39 s — AB (ref 24–36)

## 2017-06-08 LAB — CREATININE, SERUM
Creatinine, Ser: 0.88 mg/dL (ref 0.61–1.24)
GFR calc non Af Amer: >60 mL/min (ref >60)

## 2017-06-08 LAB — POCT I-STAT 4, (NA,K, GLUC, HGB,HCT)
Glucose, Bld: 109 mg/dL — ABNORMAL HIGH (ref 65–99)
HEMATOCRIT: 31 % — AB (ref 39.0–52.0)
HEMOGLOBIN: 10.5 g/dL — AB (ref 13.0–17.0)
POTASSIUM: 3.8 mmol/L (ref 3.5–5.1)
SODIUM: 140 mmol/L (ref 135–145)

## 2017-06-08 LAB — PROTIME-INR
INR: 1.64
PROTHROMBIN TIME: 19.3 s — AB (ref 11.4–15.2)

## 2017-06-08 LAB — HEMOGLOBIN AND HEMATOCRIT, BLOOD
HCT: 30.6 % — ABNORMAL LOW (ref 39.0–52.0)
HEMOGLOBIN: 10.7 g/dL — AB (ref 13.0–17.0)

## 2017-06-08 LAB — MAGNESIUM: Magnesium: 2.8 mg/dL — ABNORMAL HIGH (ref 1.7–2.4)

## 2017-06-08 LAB — PLATELET COUNT: Platelets: 130 10*3/uL — ABNORMAL LOW (ref 150–400)

## 2017-06-08 SURGERY — REPLACEMENT, AORTIC VALVE, OPEN
Anesthesia: General | Site: Chest

## 2017-06-08 MED ORDER — POTASSIUM CHLORIDE 10 MEQ/50ML IV SOLN
10.0000 meq | INTRAVENOUS | Status: AC
  Administered 2017-06-08 (×3): 10 meq via INTRAVENOUS

## 2017-06-08 MED ORDER — NITROGLYCERIN 0.2 MG/ML ON CALL CATH LAB
INTRAVENOUS | Status: DC | PRN
  Administered 2017-06-08 (×2): 40 ug via INTRAVENOUS

## 2017-06-08 MED ORDER — MORPHINE SULFATE (PF) 4 MG/ML IV SOLN
2.0000 mg | INTRAVENOUS | Status: DC | PRN
  Administered 2017-06-08: 4 mg via INTRAVENOUS
  Administered 2017-06-08: 2 mg via INTRAVENOUS
  Administered 2017-06-09: 4 mg via INTRAVENOUS
  Filled 2017-06-08 (×3): qty 1

## 2017-06-08 MED ORDER — PHENYLEPHRINE 40 MCG/ML (10ML) SYRINGE FOR IV PUSH (FOR BLOOD PRESSURE SUPPORT)
PREFILLED_SYRINGE | INTRAVENOUS | Status: AC
  Filled 2017-06-08: qty 20

## 2017-06-08 MED ORDER — INSULIN REGULAR BOLUS VIA INFUSION
0.0000 [IU] | Freq: Three times a day (TID) | INTRAVENOUS | Status: DC
  Filled 2017-06-08: qty 10

## 2017-06-08 MED ORDER — DEXTROSE 5 % IV SOLN
1.5000 g | Freq: Two times a day (BID) | INTRAVENOUS | Status: AC
  Administered 2017-06-08 – 2017-06-10 (×4): 1.5 g via INTRAVENOUS
  Filled 2017-06-08 (×5): qty 1.5

## 2017-06-08 MED ORDER — PROPOFOL 10 MG/ML IV BOLUS
INTRAVENOUS | Status: AC
  Filled 2017-06-08: qty 20

## 2017-06-08 MED ORDER — HEPARIN SODIUM (PORCINE) 1000 UNIT/ML IJ SOLN
INTRAMUSCULAR | Status: DC | PRN
  Administered 2017-06-08: 25000 [IU] via INTRAVENOUS

## 2017-06-08 MED ORDER — METOPROLOL TARTRATE 12.5 MG HALF TABLET
12.5000 mg | ORAL_TABLET | Freq: Two times a day (BID) | ORAL | Status: DC
  Administered 2017-06-09 – 2017-06-10 (×3): 12.5 mg via ORAL
  Filled 2017-06-08 (×4): qty 1

## 2017-06-08 MED ORDER — VANCOMYCIN HCL IN DEXTROSE 1-5 GM/200ML-% IV SOLN
1000.0000 mg | Freq: Once | INTRAVENOUS | Status: AC
  Administered 2017-06-08: 1000 mg via INTRAVENOUS
  Filled 2017-06-08: qty 200

## 2017-06-08 MED ORDER — GELATIN ABSORBABLE MT POWD
OROMUCOSAL | Status: DC | PRN
  Administered 2017-06-08 (×3): 4 mL via TOPICAL

## 2017-06-08 MED ORDER — ORAL CARE MOUTH RINSE
15.0000 mL | Freq: Two times a day (BID) | OROMUCOSAL | Status: DC
  Administered 2017-06-08 – 2017-06-13 (×5): 15 mL via OROMUCOSAL

## 2017-06-08 MED ORDER — CHLORHEXIDINE GLUCONATE 0.12 % MT SOLN
15.0000 mL | Freq: Once | OROMUCOSAL | Status: AC
  Administered 2017-06-08: 15 mL via OROMUCOSAL
  Filled 2017-06-08: qty 15

## 2017-06-08 MED ORDER — PROTAMINE SULFATE 10 MG/ML IV SOLN
INTRAVENOUS | Status: AC
  Filled 2017-06-08: qty 25

## 2017-06-08 MED ORDER — FAMOTIDINE 20 MG PO TABS
20.0000 mg | ORAL_TABLET | Freq: Every day | ORAL | Status: DC | PRN
  Filled 2017-06-08: qty 1

## 2017-06-08 MED ORDER — MORPHINE SULFATE (PF) 4 MG/ML IV SOLN
INTRAVENOUS | Status: AC
  Filled 2017-06-08: qty 1

## 2017-06-08 MED ORDER — SUCCINYLCHOLINE CHLORIDE 200 MG/10ML IV SOSY
PREFILLED_SYRINGE | INTRAVENOUS | Status: AC
  Filled 2017-06-08: qty 10

## 2017-06-08 MED ORDER — OXYCODONE HCL 5 MG PO TABS
5.0000 mg | ORAL_TABLET | ORAL | Status: DC | PRN
  Administered 2017-06-08 – 2017-06-10 (×6): 10 mg via ORAL
  Filled 2017-06-08 (×7): qty 2

## 2017-06-08 MED ORDER — INSULIN ASPART 100 UNIT/ML ~~LOC~~ SOLN
0.0000 [IU] | SUBCUTANEOUS | Status: DC
  Administered 2017-06-08: 2 [IU] via SUBCUTANEOUS

## 2017-06-08 MED ORDER — ARTIFICIAL TEARS OPHTHALMIC OINT
TOPICAL_OINTMENT | OPHTHALMIC | Status: DC | PRN
  Administered 2017-06-08: 1 via OPHTHALMIC

## 2017-06-08 MED ORDER — PROTAMINE SULFATE 10 MG/ML IV SOLN
INTRAVENOUS | Status: DC | PRN
  Administered 2017-06-08: 20 mg via INTRAVENOUS
  Administered 2017-06-08: 230 mg via INTRAVENOUS

## 2017-06-08 MED ORDER — LIDOCAINE 2% (20 MG/ML) 5 ML SYRINGE
INTRAMUSCULAR | Status: AC
  Filled 2017-06-08: qty 5

## 2017-06-08 MED ORDER — SODIUM CHLORIDE 0.9 % IV SOLN
INTRAVENOUS | Status: DC
  Administered 2017-06-08: 15:00:00 via INTRAVENOUS
  Filled 2017-06-08: qty 1

## 2017-06-08 MED ORDER — METOPROLOL TARTRATE 12.5 MG HALF TABLET: 12.5000 mg | ORAL_TABLET | Freq: Once | ORAL | Status: DC

## 2017-06-08 MED ORDER — HEMOSTATIC AGENTS (NO CHARGE) OPTIME
TOPICAL | Status: DC | PRN
  Administered 2017-06-08: 1 via TOPICAL

## 2017-06-08 MED ORDER — SODIUM CHLORIDE 0.45 % IV SOLN
INTRAVENOUS | Status: DC | PRN
  Administered 2017-06-08: 13:00:00 via INTRAVENOUS

## 2017-06-08 MED ORDER — SODIUM CHLORIDE 0.9 % IV SOLN: 0.0000 ug/min | INTRAVENOUS | Status: DC

## 2017-06-08 MED ORDER — ASPIRIN EC 325 MG PO TBEC
325.0000 mg | DELAYED_RELEASE_TABLET | Freq: Every day | ORAL | Status: DC
  Administered 2017-06-09 – 2017-06-10 (×2): 325 mg via ORAL
  Filled 2017-06-08 (×2): qty 1

## 2017-06-08 MED ORDER — CHLORHEXIDINE GLUCONATE 4 % EX LIQD: 30.0000 mL | CUTANEOUS | Status: DC

## 2017-06-08 MED ORDER — PHENYLEPHRINE HCL 10 MG/ML IJ SOLN
INTRAVENOUS | Status: DC | PRN
  Administered 2017-06-08: 30 ug/min via INTRAVENOUS

## 2017-06-08 MED ORDER — CHLORHEXIDINE GLUCONATE 0.12% ORAL RINSE (MEDLINE KIT): 15.0000 mL | Freq: Two times a day (BID) | OROMUCOSAL | Status: DC

## 2017-06-08 MED ORDER — LACTATED RINGERS IV SOLN
INTRAVENOUS | Status: DC
  Administered 2017-06-09: 03:00:00 via INTRAVENOUS

## 2017-06-08 MED ORDER — ACETAMINOPHEN 500 MG PO TABS
1000.0000 mg | ORAL_TABLET | Freq: Four times a day (QID) | ORAL | Status: DC
  Administered 2017-06-09 – 2017-06-10 (×7): 1000 mg via ORAL
  Filled 2017-06-08 (×7): qty 2

## 2017-06-08 MED ORDER — LACTATED RINGERS IV SOLN: 500.0000 mL | Freq: Once | INTRAVENOUS | Status: DC | PRN

## 2017-06-08 MED ORDER — ROCURONIUM BROMIDE 100 MG/10ML IV SOLN
INTRAVENOUS | Status: DC | PRN
  Administered 2017-06-08: 50 mg via INTRAVENOUS
  Administered 2017-06-08: 100 mg via INTRAVENOUS
  Administered 2017-06-08: 50 mg via INTRAVENOUS

## 2017-06-08 MED ORDER — BISACODYL 5 MG PO TBEC
10.0000 mg | DELAYED_RELEASE_TABLET | Freq: Every day | ORAL | Status: DC
  Administered 2017-06-09 – 2017-06-10 (×2): 10 mg via ORAL
  Filled 2017-06-08 (×2): qty 2

## 2017-06-08 MED ORDER — HEPARIN SODIUM (PORCINE) 1000 UNIT/ML IJ SOLN
INTRAMUSCULAR | Status: AC
  Filled 2017-06-08: qty 1

## 2017-06-08 MED ORDER — FAMOTIDINE IN NACL 20-0.9 MG/50ML-% IV SOLN
20.0000 mg | Freq: Two times a day (BID) | INTRAVENOUS | Status: AC
Start: 2017-06-08 — End: 2017-06-08
  Administered 2017-06-08 (×2): 20 mg via INTRAVENOUS
  Filled 2017-06-08: qty 50

## 2017-06-08 MED ORDER — LACTATED RINGERS IV SOLN
INTRAVENOUS | Status: DC | PRN
  Administered 2017-06-08: 07:00:00 via INTRAVENOUS

## 2017-06-08 MED ORDER — ACETAMINOPHEN 160 MG/5ML PO SOLN: 1000.0000 mg | Freq: Four times a day (QID) | ORAL | Status: DC

## 2017-06-08 MED ORDER — SODIUM CHLORIDE 0.9% FLUSH
3.0000 mL | Freq: Two times a day (BID) | INTRAVENOUS | Status: DC
  Administered 2017-06-09: 3 mL via INTRAVENOUS

## 2017-06-08 MED ORDER — EZETIMIBE 10 MG PO TABS
10.0000 mg | ORAL_TABLET | Freq: Every day | ORAL | Status: DC
  Administered 2017-06-09 – 2017-06-14 (×6): 10 mg via ORAL
  Filled 2017-06-08 (×6): qty 1

## 2017-06-08 MED ORDER — INSULIN REGULAR HUMAN 100 UNIT/ML IJ SOLN
INTRAMUSCULAR | Status: DC | PRN
  Administered 2017-06-08: .8 [IU]/h via INTRAVENOUS

## 2017-06-08 MED ORDER — PROPOFOL 10 MG/ML IV BOLUS
INTRAVENOUS | Status: DC | PRN
  Administered 2017-06-08: 50 mg via INTRAVENOUS
  Administered 2017-06-08: 70 mg via INTRAVENOUS
  Administered 2017-06-08: 40 mg via INTRAVENOUS
  Administered 2017-06-08: 10 mg via INTRAVENOUS
  Administered 2017-06-08: 30 mg via INTRAVENOUS

## 2017-06-08 MED ORDER — ORAL CARE MOUTH RINSE
15.0000 mL | Freq: Four times a day (QID) | OROMUCOSAL | Status: DC
  Administered 2017-06-08: 15 mL via OROMUCOSAL

## 2017-06-08 MED ORDER — DEXMEDETOMIDINE HCL IN NACL 400 MCG/100ML IV SOLN
0.0000 ug/kg/h | INTRAVENOUS | Status: DC
  Filled 2017-06-08 (×2): qty 100

## 2017-06-08 MED ORDER — 0.9 % SODIUM CHLORIDE (POUR BTL) OPTIME
TOPICAL | Status: DC | PRN
  Administered 2017-06-08: 5000 mL

## 2017-06-08 MED ORDER — TRAMADOL HCL 50 MG PO TABS
50.0000 mg | ORAL_TABLET | ORAL | Status: DC | PRN
  Administered 2017-06-08 – 2017-06-10 (×6): 100 mg via ORAL
  Filled 2017-06-08 (×6): qty 2

## 2017-06-08 MED ORDER — BISACODYL 10 MG RE SUPP: 10.0000 mg | Freq: Every day | RECTAL | Status: DC

## 2017-06-08 MED ORDER — MORPHINE SULFATE (PF) 2 MG/ML IV SOLN: 1.0000 mg | INTRAVENOUS | Status: AC | PRN

## 2017-06-08 MED ORDER — LACTATED RINGERS IV SOLN: INTRAVENOUS | Status: DC

## 2017-06-08 MED ORDER — ONDANSETRON HCL 4 MG/2ML IJ SOLN: 4.0000 mg | Freq: Four times a day (QID) | INTRAMUSCULAR | Status: DC | PRN

## 2017-06-08 MED ORDER — FENTANYL CITRATE (PF) 250 MCG/5ML IJ SOLN
INTRAMUSCULAR | Status: AC
  Filled 2017-06-08: qty 25

## 2017-06-08 MED ORDER — SODIUM CHLORIDE 0.9 % IV SOLN
INTRAVENOUS | Status: DC | PRN
  Administered 2017-06-08: 11:00:00 via INTRAVENOUS

## 2017-06-08 MED ORDER — THROMBIN 20000 UNITS EX SOLR
CUTANEOUS | Status: DC | PRN
  Administered 2017-06-08: 20000 [IU] via TOPICAL

## 2017-06-08 MED ORDER — NITROGLYCERIN IN D5W 200-5 MCG/ML-% IV SOLN: 0.0000 ug/min | INTRAVENOUS | Status: DC

## 2017-06-08 MED ORDER — SODIUM CHLORIDE 0.9 % IV SOLN
INTRAVENOUS | Status: DC
  Administered 2017-06-08: 13:00:00 via INTRAVENOUS

## 2017-06-08 MED ORDER — METOPROLOL TARTRATE 25 MG/10 ML ORAL SUSPENSION: 12.5000 mg | Freq: Two times a day (BID) | ORAL | Status: DC

## 2017-06-08 MED ORDER — EPHEDRINE 5 MG/ML INJ
INTRAVENOUS | Status: AC
  Filled 2017-06-08: qty 10

## 2017-06-08 MED ORDER — VANCOMYCIN HCL 1000 MG IV SOLR
INTRAVENOUS | Status: DC | PRN
  Administered 2017-06-08: 1250 mg via INTRAVENOUS

## 2017-06-08 MED ORDER — THROMBIN 20000 UNITS EX SOLR
CUTANEOUS | Status: AC
Start: 2017-06-08 — End: 2017-06-08
  Filled 2017-06-08: qty 20000

## 2017-06-08 MED ORDER — ACETAMINOPHEN 160 MG/5ML PO SOLN
650.0000 mg | Freq: Once | ORAL | Status: AC
Start: 2017-06-08 — End: 2017-06-08

## 2017-06-08 MED ORDER — MORPHINE SULFATE (PF) 2 MG/ML IV SOLN: 2.0000 mg | INTRAVENOUS | Status: DC | PRN

## 2017-06-08 MED ORDER — PANTOPRAZOLE SODIUM 40 MG PO TBEC
40.0000 mg | DELAYED_RELEASE_TABLET | Freq: Every day | ORAL | Status: DC
  Administered 2017-06-10: 40 mg via ORAL
  Filled 2017-06-08: qty 1

## 2017-06-08 MED ORDER — ALBUMIN HUMAN 5 % IV SOLN
250.0000 mL | INTRAVENOUS | Status: AC | PRN
  Administered 2017-06-08: 250 mL via INTRAVENOUS

## 2017-06-08 MED ORDER — CHLORHEXIDINE GLUCONATE 0.12 % MT SOLN
15.0000 mL | OROMUCOSAL | Status: AC
  Administered 2017-06-08: 15 mL via OROMUCOSAL

## 2017-06-08 MED ORDER — LACTATED RINGERS IV SOLN
INTRAVENOUS | Status: DC | PRN
  Administered 2017-06-08 (×2): via INTRAVENOUS

## 2017-06-08 MED ORDER — ROCURONIUM BROMIDE 10 MG/ML (PF) SYRINGE
PREFILLED_SYRINGE | INTRAVENOUS | Status: AC
  Filled 2017-06-08: qty 10

## 2017-06-08 MED ORDER — ALBUMIN HUMAN 5 % IV SOLN
INTRAVENOUS | Status: DC | PRN
  Administered 2017-06-08 (×2): via INTRAVENOUS

## 2017-06-08 MED ORDER — METOCLOPRAMIDE HCL 5 MG/ML IJ SOLN
10.0000 mg | Freq: Four times a day (QID) | INTRAMUSCULAR | Status: AC
  Administered 2017-06-08 – 2017-06-09 (×4): 10 mg via INTRAVENOUS
  Filled 2017-06-08 (×3): qty 2

## 2017-06-08 MED ORDER — MAGNESIUM SULFATE 4 GM/100ML IV SOLN
4.0000 g | Freq: Once | INTRAVENOUS | Status: AC
  Administered 2017-06-08: 4 g via INTRAVENOUS
  Filled 2017-06-08: qty 100

## 2017-06-08 MED ORDER — MIDAZOLAM HCL 5 MG/5ML IJ SOLN
INTRAMUSCULAR | Status: DC | PRN
  Administered 2017-06-08: 1 mg via INTRAVENOUS
  Administered 2017-06-08: 2 mg via INTRAVENOUS
  Administered 2017-06-08: 1 mg via INTRAVENOUS
  Administered 2017-06-08: 2 mg via INTRAVENOUS
  Administered 2017-06-08: 1 mg via INTRAVENOUS

## 2017-06-08 MED ORDER — SODIUM CHLORIDE 0.9% FLUSH: 3.0000 mL | INTRAVENOUS | Status: DC | PRN

## 2017-06-08 MED ORDER — MIDAZOLAM HCL 10 MG/2ML IJ SOLN
INTRAMUSCULAR | Status: AC
  Filled 2017-06-08: qty 2

## 2017-06-08 MED ORDER — MIDAZOLAM HCL 2 MG/2ML IJ SOLN
2.0000 mg | INTRAMUSCULAR | Status: DC | PRN
Start: 2017-06-08 — End: 2017-06-09

## 2017-06-08 MED ORDER — SODIUM CHLORIDE 0.9 % IV SOLN: 250.0000 mL | INTRAVENOUS | Status: DC

## 2017-06-08 MED ORDER — ACETAMINOPHEN 650 MG RE SUPP
650.0000 mg | Freq: Once | RECTAL | Status: AC
Start: 2017-06-08 — End: 2017-06-08
  Administered 2017-06-08: 650 mg via RECTAL

## 2017-06-08 MED ORDER — FENTANYL CITRATE (PF) 100 MCG/2ML IJ SOLN
INTRAMUSCULAR | Status: DC | PRN
Start: 2017-06-08 — End: 2017-06-08
  Administered 2017-06-08: 50 ug via INTRAVENOUS
  Administered 2017-06-08: 150 ug via INTRAVENOUS
  Administered 2017-06-08: 100 ug via INTRAVENOUS
  Administered 2017-06-08 (×2): 50 ug via INTRAVENOUS
  Administered 2017-06-08 (×2): 150 ug via INTRAVENOUS
  Administered 2017-06-08 (×3): 100 ug via INTRAVENOUS
  Administered 2017-06-08 (×3): 50 ug via INTRAVENOUS
  Administered 2017-06-08: 100 ug via INTRAVENOUS

## 2017-06-08 MED ORDER — METOPROLOL TARTRATE 5 MG/5ML IV SOLN: 2.5000 mg | INTRAVENOUS | Status: DC | PRN

## 2017-06-08 MED ORDER — ASPIRIN 81 MG PO CHEW: 324.0000 mg | CHEWABLE_TABLET | Freq: Every day | ORAL | Status: DC

## 2017-06-08 MED ORDER — DOCUSATE SODIUM 100 MG PO CAPS
200.0000 mg | ORAL_CAPSULE | Freq: Every day | ORAL | Status: DC
  Administered 2017-06-09 – 2017-06-10 (×2): 200 mg via ORAL
  Filled 2017-06-08 (×2): qty 2

## 2017-06-08 SURGICAL SUPPLY — 80 items
ADAPTER CARDIO PERF ANTE/RETRO (ADAPTER) ×4 IMPLANT
BAG DECANTER FOR FLEXI CONT (MISCELLANEOUS) IMPLANT
BLADE STERNUM SYSTEM 6 (BLADE) ×4 IMPLANT
BLADE SURG 15 STRL LF DISP TIS (BLADE) ×2 IMPLANT
BLADE SURG 15 STRL SS (BLADE) ×2
CANISTER SUCT 3000ML PPV (MISCELLANEOUS) ×4 IMPLANT
CANNULA GUNDRY RCSP 15FR (MISCELLANEOUS) ×4 IMPLANT
CATH ROBINSON RED A/P 18FR (CATHETERS) ×12 IMPLANT
CATH THORACIC 36FR (CATHETERS) ×4 IMPLANT
CATH THORACIC 36FR RT ANG (CATHETERS) ×4 IMPLANT
CONT SPEC 4OZ CLIKSEAL STRL BL (MISCELLANEOUS) ×4 IMPLANT
COVER SURGICAL LIGHT HANDLE (MISCELLANEOUS) IMPLANT
CRADLE DONUT ADULT HEAD (MISCELLANEOUS) ×4 IMPLANT
DRAPE SLUSH/WARMER DISC (DRAPES) ×4 IMPLANT
DRSG COVADERM 4X14 (GAUZE/BANDAGES/DRESSINGS) ×4 IMPLANT
ELECT CAUTERY BLADE 6.4 (BLADE) ×4 IMPLANT
ELECT REM PT RETURN 9FT ADLT (ELECTROSURGICAL) ×8
ELECTRODE REM PT RTRN 9FT ADLT (ELECTROSURGICAL) ×4 IMPLANT
FELT TEFLON 1X6 (MISCELLANEOUS) ×4 IMPLANT
GAUZE SPONGE 4X4 12PLY STRL (GAUZE/BANDAGES/DRESSINGS) ×4 IMPLANT
GLOVE BIO SURGEON STRL SZ 6 (GLOVE) IMPLANT
GLOVE BIO SURGEON STRL SZ 6.5 (GLOVE) IMPLANT
GLOVE BIO SURGEON STRL SZ7 (GLOVE) IMPLANT
GLOVE BIO SURGEON STRL SZ7.5 (GLOVE) IMPLANT
GLOVE BIO SURGEONS STRL SZ 6.5 (GLOVE)
GLOVE EUDERMIC 7 POWDERFREE (GLOVE) ×8 IMPLANT
GOWN STRL REUS W/ TWL LRG LVL3 (GOWN DISPOSABLE) ×12 IMPLANT
GOWN STRL REUS W/ TWL XL LVL3 (GOWN DISPOSABLE) ×2 IMPLANT
GOWN STRL REUS W/TWL LRG LVL3 (GOWN DISPOSABLE) ×12
GOWN STRL REUS W/TWL XL LVL3 (GOWN DISPOSABLE) ×2
HEART VENT LT CURVED (MISCELLANEOUS) ×4 IMPLANT
HEMOSTAT POWDER SURGIFOAM 1G (HEMOSTASIS) ×12 IMPLANT
HEMOSTAT SURGICEL 2X14 (HEMOSTASIS) ×4 IMPLANT
KIT BASIN OR (CUSTOM PROCEDURE TRAY) ×4 IMPLANT
KIT CATH CPB BARTLE (MISCELLANEOUS) ×4 IMPLANT
KIT ROOM TURNOVER OR (KITS) ×4 IMPLANT
KIT SUCTION CATH 14FR (SUCTIONS) ×4 IMPLANT
LINE VENT (MISCELLANEOUS) ×4 IMPLANT
NS IRRIG 1000ML POUR BTL (IV SOLUTION) ×24 IMPLANT
PACK OPEN HEART (CUSTOM PROCEDURE TRAY) ×4 IMPLANT
PAD ARMBOARD 7.5X6 YLW CONV (MISCELLANEOUS) ×8 IMPLANT
SET CARDIOPLEGIA MPS 5001102 (MISCELLANEOUS) ×4 IMPLANT
SUT BONE WAX W31G (SUTURE) ×4 IMPLANT
SUT ETHIBON 2 0 V 52N 30 (SUTURE) ×8 IMPLANT
SUT ETHIBON EXCEL 2-0 V-5 (SUTURE) ×4 IMPLANT
SUT ETHIBOND 2 0 SH (SUTURE) ×8
SUT ETHIBOND 2 0 SH 36X2 (SUTURE) ×8 IMPLANT
SUT ETHIBOND V-5 VALVE (SUTURE) ×4 IMPLANT
SUT PROLENE 3 0 SH DA (SUTURE) IMPLANT
SUT PROLENE 3 0 SH1 36 (SUTURE) ×4 IMPLANT
SUT PROLENE 4 0 RB 1 (SUTURE) ×10
SUT PROLENE 4-0 RB1 .5 CRCL 36 (SUTURE) ×10 IMPLANT
SUT PROLENE 5 0 C 1 36 (SUTURE) ×8 IMPLANT
SUT PROLENE 6 0 C 1 30 (SUTURE) ×8 IMPLANT
SUT PROLENE 8 0 BV175 6 (SUTURE) ×8 IMPLANT
SUT SILK  1 MH (SUTURE) ×6
SUT SILK 1 MH (SUTURE) ×6 IMPLANT
SUT SILK 1 TIES 10X30 (SUTURE) ×4 IMPLANT
SUT SILK 2 0 SH CR/8 (SUTURE) ×8 IMPLANT
SUT SILK 2 0 TIES 10X30 (SUTURE) ×4 IMPLANT
SUT SILK 2 0 TIES 17X18 (SUTURE) ×2
SUT SILK 2-0 18XBRD TIE BLK (SUTURE) ×2 IMPLANT
SUT SILK 3 0 SH CR/8 (SUTURE) ×4 IMPLANT
SUT SILK 4 0 TIE 10X30 (SUTURE) ×8 IMPLANT
SUT STEEL 6MS V (SUTURE) ×8 IMPLANT
SUT TEM PAC WIRE 2 0 SH (SUTURE) ×16 IMPLANT
SUT VIC AB 1 CTX 36 (SUTURE) ×4
SUT VIC AB 1 CTX36XBRD ANBCTR (SUTURE) ×4 IMPLANT
SUT VIC AB 2-0 CTX 27 (SUTURE) ×8 IMPLANT
SUT VIC AB 3-0 X1 27 (SUTURE) ×8 IMPLANT
SUTURE E-PAK OPEN HEART (SUTURE) IMPLANT
SYSTEM SAHARA CHEST DRAIN ATS (WOUND CARE) ×4 IMPLANT
TOWEL GREEN STERILE (TOWEL DISPOSABLE) ×4 IMPLANT
TOWEL GREEN STERILE FF (TOWEL DISPOSABLE) ×4 IMPLANT
TOWEL OR 17X24 6PK STRL BLUE (TOWEL DISPOSABLE) IMPLANT
TOWEL OR 17X26 10 PK STRL BLUE (TOWEL DISPOSABLE) IMPLANT
TRAY FOLEY SILVER 16FR TEMP (SET/KITS/TRAYS/PACK) ×4 IMPLANT
UNDERPAD 30X30 (UNDERPADS AND DIAPERS) ×4 IMPLANT
VALVE MAGNA EASE AORTIC 25MM (Prosthesis & Implant Heart) ×4 IMPLANT
WATER STERILE IRR 1000ML POUR (IV SOLUTION) ×8 IMPLANT

## 2017-06-08 NOTE — Anesthesia Procedure Notes (Signed)
Procedure Name: Intubation Date/Time: 06/08/2017 7:48 AM Performed by: Julieta Bellini Pre-anesthesia Checklist: Patient identified, Emergency Drugs available, Suction available and Patient being monitored Patient Re-evaluated:Patient Re-evaluated prior to induction Oxygen Delivery Method: Circle system utilized Preoxygenation: Pre-oxygenation with 100% oxygen Induction Type: IV induction Ventilation: Mask ventilation without difficulty Laryngoscope Size: Mac and 4 Grade View: Grade I Tube type: Subglottic suction tube Tube size: 7.5 mm Number of attempts: 1 Airway Equipment and Method: Stylet Placement Confirmation: ETT inserted through vocal cords under direct vision,  positive ETCO2 and breath sounds checked- equal and bilateral Secured at: 21 cm Tube secured with: Tape Dental Injury: Teeth and Oropharynx as per pre-operative assessment

## 2017-06-08 NOTE — Transfer of Care (Addendum)
Immediate Anesthesia Transfer of Care Note  Patient: [name] Brown  Procedure(s) Performed: Procedure(s) with comments: AORTIC VALVE REPLACEMENT (AVR) (N/A) - Using 30mm Perimount Magna Ease Pericardial Bioprosthesis Aortic Valve TRANSESOPHAGEAL ECHOCARDIOGRAM (TEE) (N/A)  Patient Location: ICU  Anesthesia Type:General  Level of Consciousness: sedated and Patient remains intubated per anesthesia plan  Airway & Oxygen Therapy: Patient remains intubated per anesthesia plan and Patient placed on Ventilator (see vital sign flow sheet for setting)  Post-op Assessment: Report given to RN and Post -op Vital signs reviewed and stable  Post vital signs: Reviewed and stable  Last Vitals:  Vitals:   06/08/17 0720 06/08/17 0721  BP:    Pulse: 69   Resp: 20 13  Temp:    SpO2: 100%     Last Pain:  Vitals:   06/08/17 0600  TempSrc: Oral      Patients Stated Pain Goal: 5 (35/57/32 2025)  Complications: No apparent anesthesia complications   Patient transported to ICU with standard monitors (HR, BP, SPO2, RR) and emergency drugs/equipment. Controlled ventilation maintained via ambu bag. Report given to bedside RN and respiratory therapist. Pt connected to ICU monitor and ventilator. All questions answered and vital signs stable before leaving

## 2017-06-08 NOTE — Progress Notes (Signed)
Patient ID: [name] Brown, male   DOB: [birth year], 66 y.o.   MRN: [id number]  SICU Evening Rounds:   Hemodynamically stable  CI = 2.1  Extubated and alert   Urine output good  CT output low  CBC    Component Value Date/Time   WBC 10.2 06/08/2017 1214   RBC 3.57 (L) 06/08/2017 1214   HGB 10.5 (L) 06/08/2017 1222   HCT 31.0 (L) 06/08/2017 1222   PLT 116 (L) 06/08/2017 1214   MCV 94.7 06/08/2017 1214   MCH 32.8 06/08/2017 1214   MCHC 34.6 06/08/2017 1214   RDW 13.1 06/08/2017 1214   LYMPHSABS 1.1 03/23/2017 0943   MONOABS 0.4 03/23/2017 0943   EOSABS 0.1 03/23/2017 0943   BASOSABS 0.0 03/23/2017 0943     BMET    Component Value Date/Time   NA 140 06/08/2017 1222   NA 139 03/14/2017 1042   K 3.8 06/08/2017 1222   CL 100 (L) 06/08/2017 1059   CO2 24 06/06/2017 1205   GLUCOSE 109 (H) 06/08/2017 1222   BUN 13 06/08/2017 1059   BUN 14 03/14/2017 1042   CREATININE 0.70 06/08/2017 1059   CREATININE 1.04 10/25/2016 1547   CALCIUM 9.2 06/06/2017 1205   GFRNONAA >60 06/06/2017 1205   GFRAA >60 06/06/2017 1205     A/P:  Stable postop course. Continue current plans

## 2017-06-08 NOTE — Progress Notes (Signed)
Echocardiogram Echocardiogram Transesophageal has been performed.  [name] Brown 06/08/2017, 9:12 AM

## 2017-06-08 NOTE — Interval H&P Note (Signed)
History and Physical Interval Note:  06/08/2017 6:34 AM  [name] Brown  has presented today for surgery, with the diagnosis of SEVERE AS  The various methods of treatment have been discussed with the patient and family. After consideration of risks, benefits and other options for treatment, the patient has consented to  Procedure(s): AORTIC VALVE REPLACEMENT (AVR) (N/A) TRANSESOPHAGEAL ECHOCARDIOGRAM (TEE) (N/A) as a surgical intervention .  The patient's history has been reviewed, patient examined, no change in status, stable for surgery.  I have reviewed the patient's chart and labs.  Questions were answered to the patient's satisfaction.     [name]

## 2017-06-08 NOTE — Anesthesia Procedure Notes (Addendum)
Arterial Line Insertion Start/End9/27/2018 7:05 AM Performed by: Julieta Bellini, CRNA  Patient location: Pre-op. Preanesthetic checklist: patient identified, IV checked, site marked, risks and benefits discussed, surgical consent, monitors and equipment checked, pre-op evaluation, timeout performed and anesthesia consent Lidocaine 1% used for infiltration radial was placed Catheter size: 20 G Hand hygiene performed  and maximum sterile barriers used   Attempts: 1 Procedure performed without using ultrasound guided technique. Following insertion, dressing applied and Biopatch. Post procedure assessment: normal  Patient tolerated the procedure well with no immediate complications. Additional procedure comments: Performed by Sherie Don, SRNA.

## 2017-06-08 NOTE — Op Note (Signed)
CARDIOVASCULAR SURGERY OPERATIVE NOTE  06/08/2017 [name] Brown [id number]  Surgeon:  [name], MD  First Assistant: Ellwood Handler, PA-C   Preoperative Diagnosis:  Severe aortic stenosis   Postoperative Diagnosis:  Same   Procedure:  1. Median Sternotomy 2. Extracorporeal circulation 3.   Aortic valve replacement using a 25 mm Edwards Magna-Ease pericardial valve.  Anesthesia:  General Endotracheal   Clinical History/Surgical Indication:  The patient is a 66 year old gentleman with hypertension, hyperlipidemia as well as known aortic stenosis by echo in 09/2016 with a mean gradient of 14 mm Hg and a DI of 0.2. His valve was severely calcified and it was not possible to tell if it was bicuspid or not. LVEF was 30-35% with a moderately dilated LV with inferior hypokinesis and grade 1 diastolic dysfunction. He had a cath on 11/03/2016 showing mild non-obstructive disease with an LVEF of 45-50% and normal filling pressures. A repeat echo on 01/17/2017 showed a severe thickened and calcified aortic valve with poor mobility and a mean gradient of 16 mm Hg. The LVEF was still 30-35%. A dobutamine stress echo on 02/22/2017 showed an increase in the mean gradient from 16 to 38 mm Hg at peak stress with an improvement in the LVEF from 35-40% to 55-60% at peak stress. He reports being asymptomatic walking several miles 3-4 times per week with no chest pain or shortness of breath. He works around his house and yard without difficulty and his wife is with him today and says he really has not had any symptoms.  has stage C severe, low gradient, low ejection fraction asymptomatic aortic stenosis. I have personally reviewed his cath and echo studies. He has a severely calcified and thickened aortic valve with poor mobility with a rise in the mean gradient from 16 to 38 with dobutamine and a significant improvement in his EF with dobutamine. Given his LV dysfunction I think it is best to proceed with AVR  at this time to prevent further deterioration of LV function. His cath shows mild non-obstructive CAD. I discussed the alternatives of mechanical and tissue valves with the patient and his wife. He would like to use a tissue valve which I think is the best choice at his age. I discussed the operative procedure with the patient and his wifeincluding alternatives, benefits and risks; including but not limited to bleeding, blood transfusion, infection, stroke, myocardial infarction,heart block requiring a permanent pacemaker, organ dysfunction, and death. Nash Dimmer and agrees to proceed.     Preparation:  The patient was seen in the preoperative holding area and the correct patient, correct operation were confirmed with the patient after reviewing the medical record and catheterization. The consent was signed by me. Preoperative antibiotics were given. A pulmonary arterial line and radial arterial line were placed by the anesthesia team. The patient was taken back to the operating room and positioned supine on the operating room table. After being placed under general endotracheal anesthesia by the anesthesia team a foley catheter was placed. The neck, chest, abdomen, and both legs were prepped with betadine soap and solution and draped in the usual sterile manner. A surgical time-out was taken and the correct patient and operative procedure were confirmed with the nursing and anesthesia staff.   Pre-bypass TEE:   Complete TEE assessment was performed by Dr. Fransisco Beau.   Echo Findings   Left Ventricle Normal cavity size, wall thickness and left ventricular diastolic function. LV systolic function is mildly to moderately reduced with an  EF of 40-45%. There are no obvious wall motion abnormalities. No thrombus present. No mass present.    Septum Normal atrial and ventricular septum with no septal defect and normal septal motion.    Left Atrium Normal cavity size. No thrombus present.  No mass present.    Aortic Valve Functionally bicuspid with calcific fusion of non and right coronary cusps Moderate valve calcification present. Normal leaflet separation. Moderate stenosis. Mild regurgitation.    Aorta No aneurysm present. No graft present. No significant coarctation present. No aortic dissection present    Mitral Valve Normal valve structure. No leaflet thickening and calcification present. Normal transmitral gradient. No stenosis. Normal leaflet mobility. No regurgitation. No prolapse present. No vegetation present. No abscess present. No flail present.    Right Ventricle Normal cavity size, wall thickness and ejection fraction. No thrombus present. No mass present.    Right Atrium Normal right atrial size.    Tricuspid Valve Normal tricuspid valve structure. No stenosis. No regurgitation. No prolapse.    Pulmonic Valve Normal pulmonic valve structure. No stenosis. No regurgitation.    Pericardium Normal pericardium with no pericardial effusion.    Systemic Vein IVC measures <2.1 cm and collapses >50%, suggesting a normal RA pressure of 3 mmHg. Normal SVC size.    Pulmonary Artery Normal pulmonary artery with no dilation.         Post-bypass TEE:   Post-Op TEE LEFT VENTRICLE Left ventricle unchanged from pre-bypass.  RIGHT VENTRICLE Right ventricle unchanged from pre-bypass.  AORTIC VALVE A bioprosthetic valve was placed. Valve is well seated. No paravalvular leak and The transaortic gradient is normal post replacement.  MITRAL VALVE Mitral valve unchanged from pre-bypass.  TRICUSPID VALVE Tricuspid valve unchanged from pre-bypass.      Cardiopulmonary Bypass:  A median sternotomy was performed. The pericardium was opened in the midline. Right ventricular function appeared normal. The ascending aorta was of normal size and had no palpable plaque. There were no contraindications to aortic cannulation or cross-clamping. The patient was fully  systemically heparinized and the ACT was maintained > 400 sec. The proximal aortic arch was cannulated with a 20 F aortic cannula for arterial inflow. Venous cannulation was performed via the right atrial appendage using a two-staged venous cannula. An antegrade cardioplegia/vent cannula was inserted into the mid-ascending aorta. A left ventricular vent was placed via the right superior pulmonary vein. A retrograde cardioplegia cannnula was placed into the coronary sinus via the right atrium. Aortic occlusion was performed with a single cross-clamp. Systemic cooling to 32 degrees Centigrade and topical cooling of the heart with iced saline were used. Hyperkalemic antegrade cold blood cardioplegia was used to induce diastolic arrest and then cold blood retrograde cardioplegia was given at about 20 minute intervals throughout the period of arrest to maintain myocardial temperature at or below 10 degrees centigrade. A temperature probe was inserted into the interventricular septum and an insulating pad was placed in the pericardium. Carbon dioxide was insufflated into the pericardium at 5L/min throughout the procedure to minimize intracardiac air.   Aortic Valve Replacement:   A transverse aortotomy was performed 1 cm above the take-off of the right coronary artery. The native valve was bicuspid with fusion of the right and non-coronary cusps with moderately calcified leaflets and minimal annular calcification. There were three commissures. The ostia of the coronary arteries were in normal position and were not obstructed. The native valve leaflets were excised and the annulus was decalcified with rongeurs. Care was taken to remove  all particulate debris. The left ventricle was directly inspected for debris and then irrigated with ice saline solution. The annulus was sized and a size 25 mm Edwards Magna-Ease pericardial valve was chosen. The model number was [id number] and the serial number was [id number]. While the  valve was being prepared 2-0 Ethibond pledgeted horizontal mattress sutures were placed around the annulus with the pledgets in a sub-annular position. The sutures were placed through the sewing ring and the valve lowered into place. The sutures were tied sequentially. The valve seated nicely and the coronary ostia were not obstructed. The prosthetic valve leaflets moved normally and there was no sub-valvular obstruction. The aortotomy was closed using 4-0 Prolene suture in 2 layers with felt strips to reinforce the closure.  Completion:  The patient was rewarmed to 37 degrees Centigrade. De-airing maneuvers were performed and the head placed in trendelenburg position. The crossclamp was removed with a time of 84 minutes. There was spontaneous return of sinus rhythm. The aortotomy was checked for hemostasis. Two temporary epicardial pacing wires were placed on the right atrium and two on the right ventricle. The left ventricular vent and retrograde cardioplegia cannulas were removed. The patient was weaned from CPB without difficulty on no inotropes. CPB time was 106 minutes. Cardiac output was 5 LPM. Heparin was fully reversed with protamine and the aortic and venous cannulas removed. Hemostasis was achieved. Mediastinal drainage tubes were placed. The sternum was closed with  #6 stainless steel wires. The fascia was closed with continuous # 1 vicryl suture. The subcutaneous tissue was closed with 2-0 vicryl continuous suture. The skin was closed with 3-0 vicryl subcuticular suture. All sponge, needle, and instrument counts were reported correct at the end of the case. Dry sterile dressings were placed over the incisions and around the chest tubes which were connected to pleurevac suction. The patient was then transported to the surgical intensive care unit in critical but stable condition.

## 2017-06-08 NOTE — Brief Op Note (Signed)
06/08/2017  10:33 AM  PATIENT:  [name] Brown  66 y.o. male  PRE-OPERATIVE DIAGNOSIS:  SEVERE AS  POST-OPERATIVE DIAGNOSIS:  SEVERE AS  PROCEDURE:  Procedure(s) with comments:  AORTIC VALVE REPLACEMENT  -25 mm Edwards Magna Ease Pericardial Tissue Valve  TRANSESOPHAGEAL ECHOCARDIOGRAM (TEE) (N/A)  SURGEON:  Surgeon(s) and Role:    * Bartle, Fernande Boyden, MD - Primary  PHYSICIAN ASSISTANT: Samanda Buske PA-C  ANESTHESIA:   general  EBL:  Total I/O In: 1500 [I.V.:1500] Out: 800 [Urine:800]  BLOOD ADMINISTERED: CELLSAVER  DRAINS: Mediastinal Chest Drains   LOCAL MEDICATIONS USED:  NONE  SPECIMEN:  Source of Specimen:  Aortic Valve Leaflets  DISPOSITION OF SPECIMEN:  PATHOLOGY  COUNTS:  YES  TOURNIQUET:  * No tourniquets in log *  DICTATION: .Dragon Dictation  PLAN OF CARE: Admit to inpatient   PATIENT DISPOSITION:  ICU - intubated and hemodynamically stable.   Delay start of Pharmacological VTE agent (>24hrs) due to surgical blood loss or risk of bleeding: yes

## 2017-06-08 NOTE — Procedures (Signed)
Extubation Procedure Note  Patient Details:   Name: [name] Brown DOB: 06-21-1951 MRN: [id number]   Airway Documentation:     Evaluation  O2 sats: stable throughout Complications: No apparent complications Patient did tolerate procedure well. Bilateral Breath Sounds: Diminished   Yes  Joylyn Duggin A [name] 06/08/2017, 3:31 PM

## 2017-06-08 NOTE — OR Nursing (Signed)
Forty-five minute call to SICU at 1100. Spoke to [place].

## 2017-06-08 NOTE — OR Nursing (Signed)
Twenty minute call to SICU charge nurse at 1136. Spoke to [employer]. Cath Lab also notified.

## 2017-06-08 NOTE — Progress Notes (Signed)
NIF -40 and FVC 2.3 L

## 2017-06-08 NOTE — Anesthesia Procedure Notes (Signed)
Central Venous Catheter Insertion Performed by: Roderic Palau, anesthesiologist Start/End9/27/2018 6:35 AM, 06/08/2017 6:45 AM Patient location: Pre-op. Preanesthetic checklist: patient identified, IV checked, site marked, risks and benefits discussed, surgical consent, monitors and equipment checked, pre-op evaluation, timeout performed and anesthesia consent Position: Trendelenburg Lidocaine 1% used for infiltration and patient sedated Hand hygiene performed , maximum sterile barriers used  and Seldinger technique used Catheter size: 8.5 Fr Total catheter length 10. Central line and PA cath was placed.Sheath introducer Swan type:thermodilution PA Cath depth:47 Procedure performed using ultrasound guided technique. Ultrasound Notes:anatomy identified, needle tip was noted to be adjacent to the nerve/plexus identified, no ultrasound evidence of intravascular and/or intraneural injection and image(s) printed for medical record Attempts: 1 Following insertion, line sutured and dressing applied. Post procedure assessment: blood return through all ports, free fluid flow and no air  Patient tolerated the procedure well with no immediate complications.

## 2017-06-09 ENCOUNTER — Encounter (HOSPITAL_COMMUNITY): Payer: Self-pay | Admitting: Surgery

## 2017-06-09 ENCOUNTER — Inpatient Hospital Stay (HOSPITAL_COMMUNITY): Payer: Medicare Other

## 2017-06-09 LAB — GLUCOSE, CAPILLARY
GLUCOSE-CAPILLARY: 115 mg/dL — AB (ref 65–99)
GLUCOSE-CAPILLARY: 116 mg/dL — AB (ref 65–99)
GLUCOSE-CAPILLARY: 130 mg/dL — AB (ref 65–99)
Glucose-Capillary: 133 mg/dL — ABNORMAL HIGH (ref 65–99)
Glucose-Capillary: 142 mg/dL — ABNORMAL HIGH (ref 65–99)
Glucose-Capillary: 150 mg/dL — ABNORMAL HIGH (ref 65–99)

## 2017-06-09 LAB — CBC
HCT: 33.8 % — ABNORMAL LOW (ref 39.0–52.0)
HEMATOCRIT: 32.6 % — AB (ref 39.0–52.0)
HEMOGLOBIN: 11.3 g/dL — AB (ref 13.0–17.0)
HEMOGLOBIN: 11.5 g/dL — AB (ref 13.0–17.0)
MCH: 32.7 pg (ref 26.0–34.0)
MCH: 32.7 pg (ref 26.0–34.0)
MCHC: 34.7 g/dL (ref 30.0–36.0)
MCHC: 34 g/dL (ref 30.0–36.0)
MCV: 94.2 fL (ref 78.0–100.0)
MCV: 96 fL (ref 78.0–100.0)
Platelets: 167 10*3/uL (ref 150–400)
Platelets: 167 10*3/uL (ref 150–400)
RBC: 3.46 MIL/uL — AB (ref 4.22–5.81)
RBC: 3.52 MIL/uL — AB (ref 4.22–5.81)
RDW: 13.1 % (ref 11.5–15.5)
RDW: 13.5 % (ref 11.5–15.5)
WBC: 13 10*3/uL — AB (ref 4.0–10.5)
WBC: 17.3 10*3/uL — ABNORMAL HIGH (ref 4.0–10.5)

## 2017-06-09 LAB — POCT I-STAT, CHEM 8
BUN: 17 mg/dL (ref 6–20)
CREATININE: 1.4 mg/dL — AB (ref 0.61–1.24)
Calcium, Ion: 1.16 mmol/L (ref 1.15–1.40)
Chloride: 93 mmol/L — ABNORMAL LOW (ref 101–111)
GLUCOSE: 154 mg/dL — AB (ref 65–99)
HCT: 32 % — ABNORMAL LOW (ref 39.0–52.0)
HEMOGLOBIN: 10.9 g/dL — AB (ref 13.0–17.0)
Potassium: 5.2 mmol/L — ABNORMAL HIGH (ref 3.5–5.1)
Sodium: 131 mmol/L — ABNORMAL LOW (ref 135–145)
TCO2: 25 mmol/L (ref 22–32)

## 2017-06-09 LAB — CREATININE, SERUM
Creatinine, Ser: 1.47 mg/dL — ABNORMAL HIGH (ref 0.61–1.24)
GFR calc Af Amer: 56 mL/min — ABNORMAL LOW (ref >60)
GFR calc non Af Amer: 48 mL/min — ABNORMAL LOW (ref >60)

## 2017-06-09 LAB — BASIC METABOLIC PANEL
ANION GAP: 6 (ref 5–15)
BUN: 11 mg/dL (ref 6–20)
CO2: 23 mmol/L (ref 22–32)
Calcium: 7.3 mg/dL — ABNORMAL LOW (ref 8.9–10.3)
Chloride: 105 mmol/L (ref 101–111)
Creatinine, Ser: 0.9 mg/dL (ref 0.61–1.24)
GFR calc Af Amer: >60 mL/min (ref >60)
GFR calc non Af Amer: >60 mL/min (ref >60)
GLUCOSE: 122 mg/dL — AB (ref 65–99)
POTASSIUM: 5 mmol/L (ref 3.5–5.1)
Sodium: 134 mmol/L — ABNORMAL LOW (ref 135–145)

## 2017-06-09 LAB — MAGNESIUM
Magnesium: 2.2 mg/dL (ref 1.7–2.4)
Magnesium: 2.3 mg/dL (ref 1.7–2.4)

## 2017-06-09 MED ORDER — FUROSEMIDE 10 MG/ML IJ SOLN
40.0000 mg | Freq: Once | INTRAMUSCULAR | Status: AC
  Administered 2017-06-09: 40 mg via INTRAVENOUS
  Filled 2017-06-09: qty 4

## 2017-06-09 MED FILL — Magnesium Sulfate Inj 50%: INTRAMUSCULAR | Qty: 10 | Status: AC

## 2017-06-09 MED FILL — Potassium Chloride Inj 2 mEq/ML: INTRAVENOUS | Qty: 40 | Status: AC

## 2017-06-09 MED FILL — Heparin Sodium (Porcine) Inj 1000 Unit/ML: INTRAMUSCULAR | Qty: 30 | Status: AC

## 2017-06-09 MED FILL — Heparin Sodium (Porcine) Inj 1000 Unit/ML: INTRAMUSCULAR | Qty: 2500 | Status: AC

## 2017-06-09 NOTE — Progress Notes (Signed)
1 Day Post-Op Procedure(s) (LRB): AORTIC VALVE REPLACEMENT (AVR) (N/A) TRANSESOPHAGEAL ECHOCARDIOGRAM (TEE) (N/A) Subjective: No complaints, slept well  Objective: Vital signs in last 24 hours: Temp:  [96.4 F (35.8 C)-98.6 F (37 C)] 98.2 F (36.8 C) (09/28 0700) Pulse Rate:  [70-95] 92 (09/28 0700) Cardiac Rhythm: Atrial paced (09/28 0400) Resp:  [7-24] 11 (09/28 0700) BP: (94-121)/(58-78) 94/69 (09/28 0700) SpO2:  [96 %-100 %] 96 % (09/28 0700) Arterial Line BP: (78-137)/(49-75) 111/50 (09/28 0700) FiO2 (%):  [40 %-50 %] 40 % (09/27 1350) Weight:  [78.7 kg (173 lb 8 oz)] 78.7 kg (173 lb 8 oz) (09/28 0500)  Hemodynamic parameters for last 24 hours: PAP: (9-20)/(1-14) 18/9 CO:  [2.6 L/min-5.3 L/min] 5.3 L/min CI:  [1.4 L/min/m2-2.9 L/min/m2] 2.9 L/min/m2  Intake/Output from previous day: 09/27 0701 - 09/28 0700 In: 4993.5 [P.O.:240; I.V.:3243.5; Blood:610; IV Piggyback:900] Out: 7782 [Urine:2635; Blood:1215; Chest Tube:790] Intake/Output this shift: No intake/output data recorded.  General appearance: alert and cooperative Neurologic: intact Heart: regular rate and rhythm, S1, S2 normal, no murmur, click, rub or gallop Lungs: clear to auscultation bilaterally Extremities: edema mild Wound: dressing dry  Lab Results:  Recent Labs  06/08/17 1805 06/08/17 1812 06/09/17 0305  WBC 13.8*  --  13.0*  HGB 12.0* 11.2* 11.3*  HCT 34.4* 33.0* 32.6*  PLT 144*  --  167   BMET:  Recent Labs  06/06/17 1205  06/08/17 1812 06/09/17 0305  NA 136  < > 137 134*  K 3.9  < > 4.2 5.0  CL 106  < > 103 105  CO2 24  --   --  23  GLUCOSE 94  < > 135* 122*  BUN 16  < > 11 11  CREATININE 0.93  < > 0.70 0.90  CALCIUM 9.2  --   --  7.3*  < > = values in this interval not displayed.  PT/INR:  Recent Labs  06/08/17 1214  LABPROT 19.3*  INR 1.64   ABG    Component Value Date/Time   PHART 7.404 06/08/2017 1608   HCO3 24.4 06/08/2017 1608   TCO2 23 06/08/2017 1812   ACIDBASEDEF 1.0 06/08/2017 1608   O2SAT 99.0 06/08/2017 1608   CBG (last 3)   Recent Labs  06/08/17 2052 06/08/17 2353 06/09/17 0309  GLUCAP 132* 115* 116*   CXR: clear  ECG: sinus, new LBBB postop  Assessment/Plan: S/P Procedure(s) (LRB): AORTIC VALVE REPLACEMENT (AVR) (N/A) TRANSESOPHAGEAL ECHOCARDIOGRAM (TEE) (N/A)  POD 1 Hemodynamically stable in sinus rhythm. Continue low dose beta blocker. Chest tube output low but 25/hr and still bloody so will leave chest tubes in for now. Can get out of bed with tubes in. Mobilize Diuresis DC foley See progression orders   LOS: 1 day    [name] 06/09/2017

## 2017-06-09 NOTE — Progress Notes (Signed)
Patient ID: [name] Brown, male   DOB: 1951-04-16, 67 y.o.   MRN: [id number] EVENING ROUNDS NOTE :     [address]       [place] [address]             [phone]                 1 Day Post-Op Procedure(s) (LRB): AORTIC VALVE REPLACEMENT (AVR) (N/A) TRANSESOPHAGEAL ECHOCARDIOGRAM (TEE) (N/A)  Total Length of Stay:  LOS: 1 day  BP 132/73 (BP Location: Left Arm)   Pulse 90   Temp 98.1 F (36.7 C) (Oral)   Resp (!) 25   Ht 5\' 6"  (1.676 m)   Wt 173 lb 8 oz (78.7 kg)   SpO2 95%   BMI 28.00 kg/m   .Intake/Output      09/28 0701 - 09/29 0700   P.O. 600   I.V. (mL/kg) 352.6 (4.5)   IV Piggyback 50   Total Intake(mL/kg) 1002.6 (12.7)   Urine (mL/kg/hr) 325 (0.3)   Chest Tube 80   Total Output 405   Net +597.6         . sodium chloride Stopped (06/09/17 0946)  . sodium chloride    . sodium chloride Stopped (06/09/17 0946)  . cefUROXime (ZINACEF)  IV Stopped (06/09/17 2200)  . lactated ringers    . lactated ringers 20 mL/hr at 06/09/17 2000  . lactated ringers Stopped (06/09/17 1900)  . nitroGLYCERIN Stopped (06/09/17 1300)  . phenylephrine (NEO-SYNEPHRINE) Adult infusion Stopped (06/08/17 1530)     Lab Results  Component Value Date   WBC 17.3 (H) 06/09/2017   HGB 10.9 (L) 06/09/2017   HCT 32.0 (L) 06/09/2017   PLT 167 06/09/2017   GLUCOSE 154 (H) 06/09/2017   CHOL 177 05/11/2017   TRIG 141 05/11/2017   HDL 59 05/11/2017   LDLCALC 90 05/11/2017   ALT 42 06/06/2017   AST 31 06/06/2017   NA 131 (L) 06/09/2017   K 5.2 (H) 06/09/2017   CL 93 (L) 06/09/2017   CREATININE 1.40 (H) 06/09/2017   BUN 17 06/09/2017   CO2 23 06/09/2017   TSH 4.40 03/23/2017   PSA 0.51 03/23/2017   INR 1.64 06/08/2017   HGBA1C 5.1 06/06/2017   Stable day, ambulating   Grace Isaac MD  Beeper [phone] Office [phone] 06/09/2017 9:47 PM

## 2017-06-09 NOTE — Care Management Note (Signed)
Case Management Note  Patient Details  Name: [name] Brown MRN: [id number] Date of Birth: Aug 03, 1951  Subjective/Objective:    From home,  POD 1 AVR , chest tube, cont to diuresis.               Action/Plan: NCM will follow for dc needs.   Expected Discharge Date:                  Expected Discharge Plan:  Home/Self Care  In-House Referral:     Discharge planning Services  CM Consult  Post Acute Care Choice:    Choice offered to:     DME Arranged:    DME Agency:     HH Arranged:    HH Agency:     Status of Service:  In process, will continue to follow  If discussed at Long Length of Stay Meetings, dates discussed:    Additional Comments:  Zenon Mayo, RN 06/09/2017, 10:48 AM

## 2017-06-09 NOTE — Anesthesia Postprocedure Evaluation (Signed)
Anesthesia Post Note  Patient: [name] Brown  Procedure(s) Performed: Procedure(s) (LRB): AORTIC VALVE REPLACEMENT (AVR) (N/A) TRANSESOPHAGEAL ECHOCARDIOGRAM (TEE) (N/A)     Patient location during evaluation: ICU Anesthesia Type: General Level of consciousness: awake and alert Pain management: satisfactory to patient Vital Signs Assessment: post-procedure vital signs reviewed and stable Respiratory status: spontaneous breathing, nonlabored ventilation, respiratory function stable and patient connected to nasal cannula oxygen Cardiovascular status: blood pressure returned to baseline and stable Postop Assessment: no apparent nausea or vomiting Anesthetic complications: no Comments: Extubated in critical care unit prior to postop visit, hemodynamically stable, no significant respiratory issues.    Last Vitals:  Vitals:   06/09/17 0800 06/09/17 0900  BP: 110/67 103/76  Pulse: 89 93  Resp: (!) 21 18  Temp: 36.8 C 36.8 C  SpO2: 97% 97%    Last Pain:  Vitals:   06/09/17 0810  TempSrc:   PainSc: [place]

## 2017-06-09 NOTE — Discharge Summary (Signed)
Physician Discharge Summary  Patient ID: [name] Brown MRN: [id number] DOB/AGE: 1951/04/24 66 y.o.  Admit date: 06/08/2017 Discharge date: 06/14/2017  Admission Diagnoses:  Patient Active Problem List   Diagnosis Date Noted  . Non-ischemic cardiomyopathy ([place])   . Aortic stenosis with bicuspid valve 10/25/2016  . PVC's (premature ventricular contractions) 10/25/2016  . Hyperlipidemia 10/25/2016  . Coronary artery disease involving native coronary artery of native heart without angina pectoris 09/23/2016  . Acute blood loss anemia 09/23/2016  . Systolic murmur 84/69/6295  . Cancer of right colon ([place]) 08/18/2016  . Essential hypertension   . Stercoral ulcer of large intestine 04/29/2016  . Incarcerated umbilical hernia 28/41/3244  . Perforated sigmoid colon s/p colectomy/colostomy 04/26/2016 04/25/2016   Discharge Diagnoses:   Patient Active Problem List   Diagnosis Date Noted  . PAF (paroxysmal atrial fibrillation) ([place]) 06/13/2017  . S/P AVR (aortic valve replacement) 06/08/2017  . Non-ischemic cardiomyopathy ([place])   . Aortic stenosis with bicuspid valve 10/25/2016  . PVC's (premature ventricular contractions) 10/25/2016  . Hyperlipidemia 10/25/2016  . Coronary artery disease involving native coronary artery of native heart without angina pectoris 09/23/2016  . Acute blood loss anemia 09/23/2016  . Systolic murmur 09/14/7251  . Cancer of right colon ([place]) 08/18/2016  . Essential hypertension   . Stercoral ulcer of large intestine 04/29/2016  . Incarcerated umbilical hernia 66/44/0347  . Perforated sigmoid colon s/p colectomy/colostomy 04/26/2016 04/25/2016   Discharged Condition: good  History of Present Illness:  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  She was evaluated by his Primary care physician in January at which time he as noted to have  coronary atherosclerosis on CXR.  Follow up Echocardiogram was obtained and showed a reduced EF of 30-35% and a heavily calcified aortic valve, that was likely bicuspid.  It was felt he likely had moderate aortic stenosis.  He was started on low dose ASA and referred to Cardiology for evaluation.  He was evaluated by Dr. Oval Linsey who has been following the patient since that time.  He had a cardiac catheterization in February which showed mild to moderate CAD.  The patient has been asymptomatic.  He had repeat Echocardiogram in May which showed continued reduced EF, but now he had severe valve leaflet mobility due to fusion of the coronary cusps.  At that time the patient continued to be asymptomatic.  It was felt due to the reduction of his systolic heart function a Dobutamine stress echocardiogram would be indicated.  This was obtained and showed augmentation of aortic stenosis in the moderate to severe range.  Due to this the patient was referred to [facility] for evaluation.  He was evaluated by Dr. Cyndia Bent in July who felt the patient would benefit from surgical intervention on his Aortic valve.  The patient elected to use a tissue valve.  The risks and benefits of the procedure were explained to the patient and he was agreeable to proceed.  However he would contact our office at a later date to set up surgery.        Hospital Course:   Mr. Rosten presented to [facility] on 06/08/2017.  He was taken to the operating room and underwent Aortic Valve Replacement utilizing a 25 mm [facility] Ease Pericardial Tissue Valve.  He tolerated the procedure without difficulty and was taken to the SICU in  stable condition.  He was extubated the evening of surgery.  During his stay in the SICU the patients chest tubes and arterial lines were removed without difficulty. He was maintaining NSR with occasional PVCs.  He was tachycardic at times.  He was felt medically stable for transfer to  the telemetry unit on 06/10/2017.  He continues to make progress.  He was hypertensive and restarted on his home dose of Cozaar.  He stated that Toprol XL works better for him and his lopressor was changed to this.  He developed Rapid Atrial Fibrillation.  He was treated with IV Amiodarone bolus and drip.  He successfully converted to NSR.  He did have a run of SVT/A. Flutter the evening of 10/01. He then continued to maintain NSR, first degree heart block.  His pacing wires were removed without difficulty.  He was ambulating independently.  He was tolerating a diet without any issues.  He is medically stable for discharge home today.     Significant Diagnostic Studies: cardiac graphics:   Echocardiogram:   - Aortic valve: Severely thickened, severely calcified leaflets.   Valve mobility was restricted. There was mild regurgitation. Peak   velocity (S): 388 cm/s. Mean gradient (S): 38 mm Hg. Valve area   (VTI): 0.96 cm^2. Valve area (Vmax): 0.47 cm^2. Valve area   (Vmean): 0.46 cm^2. - Stress ECG conclusions: There were no stress arrhythmias or   conduction abnormalities. The stress ECG was negative for   ischemia. - Staged echo: There was no echocardiographic evidence for   stress-induced ischemia.  Impressions:  - Mild aortic stenosis at rest augments to moderate to severe   aortic stenosis with infusion of 20 mcg/kg/min of dobutamine.  Treatments: surgery:  1. Median Sternotomy 2. Extracorporeal circulation 3.   Aortic valve replacement using a 25 mm [facility] Magna-Ease pericardial valve by Dr. Cyndia Bent on 06/08/2017.  Disposition: 01-Home or Self Care   Discharge Medications:  The patient has been discharged on:   1.Beta Blocker:  Yes [ x  ]                              No   [   ]                              If No, reason:  2.Ace Inhibitor/ARB: Yes [ x  ]                                     No  [    ]                                     If No, reason:  3.Statin:   Yes [    ]                  No  [ x  ]                  If No, reason: NO CAD, on Zetia  4.[place]:  Yes  [ x  ]                  No   [   ]  If No, reason:     Discharge Instructions    Amb Referral to Cardiac Rehabilitation    Complete by:  As directed    Diagnosis:  Valve Replacement   Valve:  Aortic     Allergies as of 06/14/2017   No Known Allergies     Medication List    STOP taking these medications   metoprolol succinate 25 MG 24 hr tablet Commonly known as:  TOPROL-XL     TAKE these medications   acetaminophen 325 MG tablet Commonly known as:  TYLENOL Take 2 tablets (650 mg total) by mouth every 6 (six) hours as needed for mild pain.   amiodarone 400 MG tablet Commonly known as:  PACERONE Take 1 tablet (400 mg total) by mouth 2 (two) times daily.   aspirin 325 MG EC tablet Take 1 tablet (325 mg total) by mouth daily. What changed:  medication strength  how much to take   ezetimibe 10 MG tablet Commonly known as:  ZETIA Take 1 tablet (10 mg total) by mouth daily.   famotidine 20 MG tablet Commonly known as:  PEPCID Take 20 mg by mouth daily as needed for heartburn or indigestion.   ibuprofen 600 MG tablet Commonly known as:  ADVIL,MOTRIN Take 1 tablet (600 mg total) by mouth 4 (four) times daily.   losartan 25 MG tablet Commonly known as:  COZAAR Take 1 tablet (25 mg total) by mouth daily.   MELATONIN PO Take 1 tablet by mouth at bedtime as needed (sleep).   metoprolol tartrate 25 MG tablet Commonly known as:  LOPRESSOR Take 1 tablet (25 mg total) by mouth 2 (two) times daily.   oxyCODONE 5 MG immediate release tablet Commonly known as:  Oxy IR/ROXICODONE Take 5 mg by mouth every 4-6 hours PRN severe pain.      Follow-up Information    [name], MD Follow up on 07/19/2017.   Specialty:  Cardiothoracic Surgery Why:  Appoiontment is at 2:30, please get CXR at [facility] imaging 30 min prior to your appointment with Dr.  Lyn Henri information: [place] [address] [phone]        Lonn Georgia, PA-C Follow up on 07/03/2017.   Specialties:  Cardiology, Radiology Why:  Appointment is at 10:00 Contact information: [address] [place] [address] [phone]        [facility] [place] Follow up on 06/20/2017.   Specialty:  Cardiothoracic Surgery Why:  Appointment is at 10:00 for suture removal Contact information: [address] [place], [place] [phone]          Signed: Ellwood Handler 06/14/2017, 1:22 PM

## 2017-06-09 NOTE — Discharge Instructions (Signed)
Aortic Valve Replacement, Care After °Refer to this sheet in the next few weeks. These instructions provide you with information about caring for yourself after your procedure. Your health care provider may also give you more specific instructions. Your treatment has been planned according to current medical practices, but problems sometimes occur. Call your health care provider if you have any problems or questions after your procedure. °What can I expect after the procedure? °After the procedure, it is common to have: °· Pain around your incision area. °· A small amount of blood or clear fluid coming from your incision. ° °Follow these instructions at home: °Eating and drinking ° °· Follow instructions from your health care provider about eating or drinking restrictions. °? Limit alcohol intake to no more than 1 drink per day for nonpregnant women and 2 drinks per day for men. One drink equals 12 oz of beer, 5 oz of wine, or 1½ oz of hard liquor. °? Limit how much caffeine you drink. Caffeine can affect your heart's rate and rhythm. °· Drink enough fluid to keep your urine clear or pale yellow. °· Eat a heart-healthy diet. This should include plenty of fresh fruits and vegetables. If you eat meat, it should be lean cuts. Avoid foods that are: °? High in salt, saturated fat, or sugar. °? Canned or highly processed. °? Fried. °Activity °· Return to your normal activities as told by your health care provider. Ask your health care provider what activities are safe for you. °· Exercise regularly once you have recovered, as told by your health care provider. °· Avoid sitting for more than 2 hours at a time without moving. Get up and move around at least once every 1-2 hours. This helps to prevent blood clots in the legs. °· Do not lift anything that is heavier than 10 lb (4.5 kg) until your health care provider approves. °· Avoid pushing or pulling things with your arms until your health care provider approves. This  includes pulling on handrails to help you climb stairs. °Incision care ° °· Follow instructions from your health care provider about how to take care of your incision. Make sure you: °? Wash your hands with soap and water before you change your bandage (dressing). If soap and water are not available, use hand sanitizer. °? Change your dressing as told by your health care provider. °? Leave stitches (sutures), skin glue, or adhesive strips in place. These skin closures may need to stay in place for 2 weeks or longer. If adhesive strip edges start to loosen and curl up, you may trim the loose edges. Do not remove adhesive strips completely unless your health care provider tells you to do that. °· Check your incision area every day for signs of infection. Check for: °? More redness, swelling, or pain. °? More fluid or blood. °? Warmth. °? Pus or a bad smell. °Medicines °· Take over-the-counter and prescription medicines only as told by your health care provider. °· If you were prescribed an antibiotic medicine, take it as told by your health care provider. Do not stop taking the antibiotic even if you start to feel better. °Travel °· Avoid airplane travel for as long as told by your health care provider. °· When you travel, bring a list of your medicines and a record of your medical history with you. Carry your medicines with you. °Driving °· Ask your health care provider when it is safe for you to drive. Do not drive until your health   care provider approves. °· Do not drive or operate heavy machinery while taking prescription pain medicine. °Lifestyle ° °· Do not use any tobacco products, such as cigarettes, chewing tobacco, or e-cigarettes. If you need help quitting, ask your health care provider. °· Resume sexual activity as told by your health care provider. Do not use medicines for erectile dysfunction unless your health care provider approves, if this applies. °· Work with your health care provider to keep your  blood pressure and cholesterol under control, and to manage any other heart conditions that you have. °· Maintain a healthy weight. °General instructions °· Do not take baths, swim, or use a hot tub until your health care provider approves. °· Do not strain to have a bowel movement. °· Avoid crossing your legs while sitting down. °· Check your temperature every day for a fever. A fever may be a sign of infection. °· If you are a woman and you plan to become pregnant, talk with your health care provider before you become pregnant. °· Wear compression stockings if your health care provider instructs you to do this. These stockings help to prevent blood clots and reduce swelling in your legs. °· Tell all health care providers who care for you that you have an artificial (prosthetic) aortic valve. If you have or have had heart disease or endocarditis, tell all health care providers about these conditions as well. °· Keep all follow-up visits as told by your health care provider. This is important. °Contact a health care provider if: °· You develop a skin rash. °· You experience sudden, unexplained changes in your weight. °· You have more redness, swelling, or pain around your incision. °· You have more fluid or blood coming from your incision. °· Your incision feels warm to the touch. °· You have pus or a bad smell coming from your incision. °· You have a fever. °Get help right away if: °· You develop chest pain that is different from the pain coming from your incision. °· You develop shortness of breath or difficulty breathing. °· You start to feel light-headed. °These symptoms may represent a serious problem that is an emergency. Do not wait to see if the symptoms will go away. Get medical help right away. Call your local emergency services (911 in the [place] Do not drive yourself to the hospital. °This information is not intended to replace advice given to you by your health care provider. Make sure you discuss any  questions you have with your health care provider. °Document Released: 03/17/2005 Document Revised: 02/04/2016 Document Reviewed: 08/02/2015 °Elsevier Interactive Patient Education © 2017 [employer] ° °

## 2017-06-10 ENCOUNTER — Inpatient Hospital Stay (HOSPITAL_COMMUNITY): Payer: Medicare Other

## 2017-06-10 ENCOUNTER — Encounter (HOSPITAL_COMMUNITY): Payer: Self-pay | Admitting: *Deleted

## 2017-06-10 LAB — CBC
HEMATOCRIT: 29.9 % — AB (ref 39.0–52.0)
Hemoglobin: 10.2 g/dL — ABNORMAL LOW (ref 13.0–17.0)
MCH: 32.7 pg (ref 26.0–34.0)
MCHC: 34.1 g/dL (ref 30.0–36.0)
MCV: 95.8 fL (ref 78.0–100.0)
PLATELETS: 130 10*3/uL — AB (ref 150–400)
RBC: 3.12 MIL/uL — AB (ref 4.22–5.81)
RDW: 13.4 % (ref 11.5–15.5)
WBC: 12.3 10*3/uL — AB (ref 4.0–10.5)

## 2017-06-10 LAB — BASIC METABOLIC PANEL
ANION GAP: 6 (ref 5–15)
BUN: 13 mg/dL (ref 6–20)
CO2: 27 mmol/L (ref 22–32)
Calcium: 8 mg/dL — ABNORMAL LOW (ref 8.9–10.3)
Chloride: 97 mmol/L — ABNORMAL LOW (ref 101–111)
Creatinine, Ser: 1.06 mg/dL (ref 0.61–1.24)
GFR calc Af Amer: >60 mL/min (ref >60)
GLUCOSE: 139 mg/dL — AB (ref 65–99)
Potassium: 4.2 mmol/L (ref 3.5–5.1)
SODIUM: 130 mmol/L — AB (ref 135–145)

## 2017-06-10 LAB — GLUCOSE, CAPILLARY
GLUCOSE-CAPILLARY: 121 mg/dL — AB (ref 65–99)
Glucose-Capillary: 135 mg/dL — ABNORMAL HIGH (ref 65–99)
Glucose-Capillary: 154 mg/dL — ABNORMAL HIGH (ref 65–99)

## 2017-06-10 MED ORDER — SODIUM CHLORIDE 0.9% FLUSH
3.0000 mL | Freq: Two times a day (BID) | INTRAVENOUS | Status: DC
  Administered 2017-06-10 – 2017-06-13 (×5): 3 mL via INTRAVENOUS

## 2017-06-10 MED ORDER — ONDANSETRON HCL 4 MG PO TABS: 4.0000 mg | ORAL_TABLET | Freq: Four times a day (QID) | ORAL | Status: DC | PRN

## 2017-06-10 MED ORDER — TRAMADOL HCL 50 MG PO TABS: 50.0000 mg | ORAL_TABLET | ORAL | Status: DC | PRN

## 2017-06-10 MED ORDER — SODIUM CHLORIDE 0.9% FLUSH: 3.0000 mL | INTRAVENOUS | Status: DC | PRN

## 2017-06-10 MED ORDER — SODIUM CHLORIDE 0.9 % IV SOLN: 250.0000 mL | INTRAVENOUS | Status: DC | PRN

## 2017-06-10 MED ORDER — ACETAMINOPHEN 325 MG PO TABS
650.0000 mg | ORAL_TABLET | Freq: Four times a day (QID) | ORAL | Status: DC | PRN
  Administered 2017-06-13: 650 mg via ORAL
  Filled 2017-06-10: qty 2

## 2017-06-10 MED ORDER — ASPIRIN EC 325 MG PO TBEC
325.0000 mg | DELAYED_RELEASE_TABLET | Freq: Every day | ORAL | Status: DC
  Administered 2017-06-11 – 2017-06-14 (×4): 325 mg via ORAL
  Filled 2017-06-10 (×4): qty 1

## 2017-06-10 MED ORDER — ONDANSETRON HCL 4 MG/2ML IJ SOLN: 4.0000 mg | Freq: Four times a day (QID) | INTRAMUSCULAR | Status: DC | PRN

## 2017-06-10 MED ORDER — FUROSEMIDE 40 MG PO TABS
40.0000 mg | ORAL_TABLET | Freq: Every day | ORAL | Status: AC
  Administered 2017-06-10 – 2017-06-13 (×4): 40 mg via ORAL
  Filled 2017-06-10 (×4): qty 1

## 2017-06-10 MED ORDER — POTASSIUM CHLORIDE CRYS ER 20 MEQ PO TBCR
20.0000 meq | EXTENDED_RELEASE_TABLET | Freq: Two times a day (BID) | ORAL | Status: DC
  Administered 2017-06-10 – 2017-06-13 (×7): 20 meq via ORAL
  Filled 2017-06-10 (×7): qty 1

## 2017-06-10 MED ORDER — METOPROLOL TARTRATE 12.5 MG HALF TABLET
12.5000 mg | ORAL_TABLET | Freq: Two times a day (BID) | ORAL | Status: DC
  Administered 2017-06-10: 12.5 mg via ORAL
  Filled 2017-06-10: qty 1

## 2017-06-10 MED ORDER — OXYCODONE HCL 5 MG PO TABS
5.0000 mg | ORAL_TABLET | ORAL | Status: DC | PRN
  Administered 2017-06-10 – 2017-06-11 (×2): 10 mg via ORAL
  Filled 2017-06-10 (×2): qty 2

## 2017-06-10 MED ORDER — MOVING RIGHT ALONG BOOK
Freq: Once | Status: AC
  Administered 2017-06-10: 20:00:00
  Filled 2017-06-10: qty 1

## 2017-06-10 NOTE — Progress Notes (Signed)
Transferred pt to 4e13 at this time.  Pt ambulated from 2H04 to 4w13 without difficulty.  No c/o pain.  No s/s of acute distress.  Curt Bears RN received report.

## 2017-06-10 NOTE — Progress Notes (Signed)
2 Days Post-Op Procedure(s) (LRB): AORTIC VALVE REPLACEMENT (AVR) (N/A) TRANSESOPHAGEAL ECHOCARDIOGRAM (TEE) (N/A) Subjective: No complaints. Ambulating well  Objective: Vital signs in last 24 hours: Temp:  [97.6 F (36.4 C)-98.8 F (37.1 C)] 97.8 F (36.6 C) (09/29 0722) Pulse Rate:  [50-106] 50 (09/29 0800) Cardiac Rhythm: Normal sinus rhythm (09/29 0400) Resp:  [11-25] 19 (09/29 0800) BP: (83-139)/(61-106) 122/77 (09/29 0800) SpO2:  [92 %-100 %] 97 % (09/29 0800) Arterial Line BP: (107-129)/(56-61) 129/61 (09/28 1100) Weight:  [79.7 kg (175 lb 11.3 oz)] 79.7 kg (175 lb 11.3 oz) (09/29 0448)  Hemodynamic parameters for last 24 hours: PAP: (19-21)/(10-12) 19/12  Intake/Output from previous day: 09/28 0701 - 09/29 0700 In: 1272.6 [P.O.:600; I.V.:572.6; IV Piggyback:100] Out: 425 [Urine:325; Chest Tube:100] Intake/Output this shift: Total I/O In: 20 [I.V.:20] Out: -   General appearance: alert and cooperative Neurologic: intact Heart: regular rate and rhythm, S1, S2 normal, no murmur, click, rub or gallop Lungs: clear to auscultation bilaterally Wound: dressing dry chest tube output low and serosanguinous.  Lab Results:  Recent Labs  06/09/17 1700 06/09/17 1729 06/10/17 0405  WBC 17.3*  --  12.3*  HGB 11.5* 10.9* 10.2*  HCT 33.8* 32.0* 29.9*  PLT 167  --  130*   BMET:  Recent Labs  06/09/17 0305  06/09/17 1729 06/10/17 0405  NA 134*  --  131* 130*  K 5.0  --  5.2* 4.2  CL 105  --  93* 97*  CO2 23  --   --  27  GLUCOSE 122*  --  154* 139*  BUN 11  --  17 13  CREATININE 0.90  < > 1.40* 1.06  CALCIUM 7.3*  --   --  8.0*  < > = values in this interval not displayed.  PT/INR:  Recent Labs  06/08/17 1214  LABPROT 19.3*  INR 1.64   ABG    Component Value Date/Time   PHART 7.404 06/08/2017 1608   HCO3 24.4 06/08/2017 1608   TCO2 25 06/09/2017 1729   ACIDBASEDEF 1.0 06/08/2017 1608   O2SAT 99.0 06/08/2017 1608   CBG (last 3)   Recent Labs  06/09/17 2336 06/10/17 0318 06/10/17 0725  GLUCAP 133* 135* 121*    Assessment/Plan: S/P Procedure(s) (LRB): AORTIC VALVE REPLACEMENT (AVR) (N/A) TRANSESOPHAGEAL ECHOCARDIOGRAM (TEE) (N/A)  POD 2  Hemodynamically stable in sinus rhythm.   DC chest tubes and neck sleeve  Transfer to 4E and continue ambulation, IS   LOS: 2 days    [name] Brown 06/10/2017

## 2017-06-10 NOTE — Progress Notes (Signed)
Patient arrive to unit walking behind wheelchair, coming to [address] [place] [address]  From Unit 2 Heart.  Alert & oriented, placed on Telemetry and CCMD  Notified.  B/P 145/82  HR  92  Sinus rhythm  O2 sat  95% on room air.   Mervyn Skeeters, RN

## 2017-06-10 NOTE — Progress Notes (Signed)
Patient ID: [name] Brown, male   DOB: February 28, 1951, 66 y.o.   MRN: [id number] [facility] DAILY ICU PROGRESS NOTE                   [address]            [place] [address]          [phone]   2 Days Post-Op Procedure(s) (LRB): AORTIC VALVE REPLACEMENT (AVR) (N/A) TRANSESOPHAGEAL ECHOCARDIOGRAM (TEE) (N/A)  Total Length of Stay:  LOS: 2 days   Subjective: Awake and alert, walked in unit at 5 am  Objective: Vital signs in last 24 hours: Temp:  [97.6 F (36.4 C)-98.8 F (37.1 C)] 97.8 F (36.6 C) (09/29 0722) Pulse Rate:  [83-106] 94 (09/29 0745) Cardiac Rhythm: Normal sinus rhythm (09/29 0400) Resp:  [11-25] 21 (09/29 0745) BP: (83-139)/(61-106) 109/79 (09/29 0700) SpO2:  [92 %-100 %] 97 % (09/29 0745) Arterial Line BP: (107-129)/(52-61) 129/61 (09/28 1100) Weight:  [175 lb 11.3 oz (79.7 kg)] 175 lb 11.3 oz (79.7 kg) (09/29 0448)  Filed Weights   06/09/17 0500 06/10/17 0448  Weight: 173 lb 8 oz (78.7 kg) 175 lb 11.3 oz (79.7 kg)    Weight change: 2 lb 3.3 oz (1 kg)   Hemodynamic parameters for last 24 hours: PAP: (14-21)/(7-12) 19/12  Intake/Output from previous day: 09/28 0701 - 09/29 0700 In: 1272.6 [P.O.:600; I.V.:572.6; IV Piggyback:100] Out: 425 [Urine:325; Chest Tube:100]  Intake/Output this shift: No intake/output data recorded.  Current Meds: Scheduled Meds: . acetaminophen  1,000 mg Oral Q6H   Or  . acetaminophen (TYLENOL) oral liquid 160 mg/5 mL  1,000 mg Per Tube Q6H  . aspirin EC  325 mg Oral Daily   Or  . aspirin  324 mg Per Tube Daily  . bisacodyl  10 mg Oral Daily   Or  . bisacodyl  10 mg Rectal Daily  . docusate sodium  200 mg Oral Daily  . ezetimibe  10 mg Oral Daily  . mouth rinse  15 mL Mouth Rinse BID  . metoprolol tartrate  12.5 mg Oral BID   Or  . metoprolol tartrate  12.5 mg Per Tube BID  . pantoprazole  40 mg Oral Daily  . sodium chloride flush  3 mL Intravenous Q12H   Continuous Infusions: . sodium chloride  Stopped (06/09/17 0946)  . sodium chloride    . sodium chloride Stopped (06/09/17 0946)  . cefUROXime (ZINACEF)  IV Stopped (06/09/17 2200)  . lactated ringers    . lactated ringers 20 mL/hr at 06/10/17 0700  . lactated ringers Stopped (06/09/17 1900)  . nitroGLYCERIN Stopped (06/09/17 1300)  . phenylephrine (NEO-SYNEPHRINE) Adult infusion Stopped (06/08/17 1530)   PRN Meds:.sodium chloride, famotidine, lactated ringers, metoprolol tartrate, morphine injection, ondansetron (ZOFRAN) IV, oxyCODONE, sodium chloride flush, traMADol  General appearance: alert and cooperative Neurologic: intact Heart: regular rate and rhythm, S1, S2 normal, no murmur, click, rub or gallop Lungs: diminished breath sounds bibasilar Abdomen: soft, non-tender; bowel sounds normal; no masses,  no organomegaly Extremities: extremities normal, atraumatic, no cyanosis or edema and Homans sign is negative, no sign of DVT Wound: intact  Lab Results: CBC: Recent Labs  06/09/17 1700 06/09/17 1729 06/10/17 0405  WBC 17.3*  --  12.3*  HGB 11.5* 10.9* 10.2*  HCT 33.8* 32.0* 29.9*  PLT 167  --  130*   BMET:  Recent Labs  06/09/17 0305  06/09/17 1729 06/10/17 0405  NA 134*  --  131* 130*  K 5.0  --  5.2* 4.2  CL 105  --  93* 97*  CO2 23  --   --  27  GLUCOSE 122*  --  154* 139*  BUN 11  --  17 13  CREATININE 0.90  < > 1.40* 1.06  CALCIUM 7.3*  --   --  8.0*  < > = values in this interval not displayed.  CMET: Lab Results  Component Value Date   WBC 12.3 (H) 06/10/2017   HGB 10.2 (L) 06/10/2017   HCT 29.9 (L) 06/10/2017   PLT 130 (L) 06/10/2017   GLUCOSE 139 (H) 06/10/2017   CHOL 177 05/11/2017   TRIG 141 05/11/2017   HDL 59 05/11/2017   LDLCALC 90 05/11/2017   ALT 42 06/06/2017   AST 31 06/06/2017   NA 130 (L) 06/10/2017   K 4.2 06/10/2017   CL 97 (L) 06/10/2017   CREATININE 1.06 06/10/2017   BUN 13 06/10/2017   CO2 27 06/10/2017   TSH 4.40 03/23/2017   PSA 0.51 03/23/2017   INR 1.64  06/08/2017   HGBA1C 5.1 06/06/2017      PT/INR:  Recent Labs  06/08/17 1214  LABPROT 19.3*  INR 1.64   Radiology: No results found.   Assessment/Plan: S/P Procedure(s) (LRB): AORTIC VALVE REPLACEMENT (AVR) (N/A) TRANSESOPHAGEAL ECHOCARDIOGRAM (TEE) (N/A) Mobilize Diuresis d/c tubes/lines Expected Acute  Blood - loss Anemia Cr stable  minimal chest tube drainage- d/c today     Grace Isaac 06/10/2017 7:59 AM

## 2017-06-11 ENCOUNTER — Inpatient Hospital Stay (HOSPITAL_COMMUNITY): Payer: Medicare Other

## 2017-06-11 LAB — BASIC METABOLIC PANEL
Anion gap: 7 (ref 5–15)
BUN: 12 mg/dL (ref 6–20)
CHLORIDE: 97 mmol/L — AB (ref 101–111)
CO2: 28 mmol/L (ref 22–32)
CREATININE: 0.99 mg/dL (ref 0.61–1.24)
Calcium: 8.1 mg/dL — ABNORMAL LOW (ref 8.9–10.3)
GFR calc Af Amer: >60 mL/min (ref >60)
GFR calc non Af Amer: >60 mL/min (ref >60)
Glucose, Bld: 131 mg/dL — ABNORMAL HIGH (ref 65–99)
Potassium: 4 mmol/L (ref 3.5–5.1)
Sodium: 132 mmol/L — ABNORMAL LOW (ref 135–145)

## 2017-06-11 LAB — CBC
HCT: 27.6 % — ABNORMAL LOW (ref 39.0–52.0)
HEMOGLOBIN: 9.5 g/dL — AB (ref 13.0–17.0)
MCH: 33.3 pg (ref 26.0–34.0)
MCHC: 34.4 g/dL (ref 30.0–36.0)
MCV: 96.8 fL (ref 78.0–100.0)
Platelets: 128 10*3/uL — ABNORMAL LOW (ref 150–400)
RBC: 2.85 MIL/uL — ABNORMAL LOW (ref 4.22–5.81)
RDW: 13.6 % (ref 11.5–15.5)
WBC: 9.1 10*3/uL (ref 4.0–10.5)

## 2017-06-11 MED ORDER — AMIODARONE HCL IN DEXTROSE 360-4.14 MG/200ML-% IV SOLN: 30.0000 mg/h | INTRAVENOUS | Status: DC

## 2017-06-11 MED ORDER — LOSARTAN POTASSIUM 25 MG PO TABS
25.0000 mg | ORAL_TABLET | Freq: Every day | ORAL | Status: DC
  Administered 2017-06-11 – 2017-06-14 (×4): 25 mg via ORAL
  Filled 2017-06-11 (×4): qty 1

## 2017-06-11 MED ORDER — AMIODARONE HCL IN DEXTROSE 360-4.14 MG/200ML-% IV SOLN
60.0000 mg/h | INTRAVENOUS | Status: AC
  Administered 2017-06-11 (×2): 60 mg/h via INTRAVENOUS
  Filled 2017-06-11: qty 200

## 2017-06-11 MED ORDER — METOPROLOL TARTRATE 5 MG/5ML IV SOLN
5.0000 mg | Freq: Four times a day (QID) | INTRAVENOUS | Status: DC | PRN
  Administered 2017-06-13 (×2): 5 mg via INTRAVENOUS
  Filled 2017-06-11 (×2): qty 5

## 2017-06-11 MED ORDER — AMIODARONE HCL IN DEXTROSE 360-4.14 MG/200ML-% IV SOLN
INTRAVENOUS | Status: AC
  Filled 2017-06-11: qty 200

## 2017-06-11 MED ORDER — AMIODARONE HCL 200 MG PO TABS
400.0000 mg | ORAL_TABLET | Freq: Two times a day (BID) | ORAL | Status: DC
  Administered 2017-06-11 – 2017-06-14 (×6): 400 mg via ORAL
  Filled 2017-06-11 (×7): qty 2

## 2017-06-11 MED ORDER — METOPROLOL SUCCINATE ER 25 MG PO TB24
25.0000 mg | ORAL_TABLET | Freq: Every day | ORAL | Status: DC
  Administered 2017-06-11: 25 mg via ORAL
  Filled 2017-06-11: qty 1

## 2017-06-11 MED ORDER — AMIODARONE LOAD VIA INFUSION
150.0000 mg | Freq: Once | INTRAVENOUS | Status: AC
  Administered 2017-06-11: 150 mg via INTRAVENOUS
  Filled 2017-06-11: qty 83.34

## 2017-06-11 MED ORDER — LACTULOSE 10 GM/15ML PO SOLN
20.0000 g | Freq: Every day | ORAL | Status: DC | PRN
  Administered 2017-06-11: 20 g via ORAL
  Filled 2017-06-11 (×2): qty 30

## 2017-06-11 NOTE — Progress Notes (Signed)
Patient in Atrial fib this AM rate up to 150s, patient feels it "beeting in chest" patient does feel hot and sweaty BP 120/75 and 108/69. Erin Barrett PAC made aware and orders received. Amiodarone bolus given as ordered and infusion started. Patient with Patent IV at time of infusion starting. Will continue to monitor patient. Azariel Banik, Bettina Gavia rN

## 2017-06-11 NOTE — Progress Notes (Signed)
  Amiodarone Drug - Drug Interaction Consult Note  Recommendations:  Amiodarone is metabolized by the cytochrome P450 system and therefore has the potential to cause many drug interactions. Amiodarone has an average plasma half-life of 50 days (range 20 to 100 days).   There is potential for drug interactions to occur several weeks or months after stopping treatment and the onset of drug interactions may be slow after initiating amiodarone.   []  Statins: Increased risk of myopathy. Simvastatin- restrict dose to 20mg  daily. Other statins: counsel patients to report any muscle pain or weakness immediately.  []  Anticoagulants: Amiodarone can increase anticoagulant effect. Consider warfarin dose reduction. Patients should be monitored closely and the dose of anticoagulant altered accordingly, remembering that amiodarone levels take several weeks to stabilize.  []  Antiepileptics: Amiodarone can increase plasma concentration of phenytoin, the dose should be reduced. Note that small changes in phenytoin dose can result in large changes in levels. Monitor patient and counsel on signs of toxicity.  [x]  Beta blockers: increased risk of bradycardia, AV block and myocardial depression. Sotalol - avoid concomitant use.  []   Calcium channel blockers (diltiazem and verapamil): increased risk of bradycardia, AV block and myocardial depression.  []   Cyclosporine: Amiodarone increases levels of cyclosporine. Reduced dose of cyclosporine is recommended.  []  Digoxin dose should be halved when amiodarone is started.  [x]  Diuretics: increased risk of cardiotoxicity if hypokalemia occurs.  []  Oral hypoglycemic agents (glyburide, glipizide, glimepiride): increased risk of hypoglycemia. Patient's glucose levels should be monitored closely when initiating amiodarone therapy.   []  Drugs that prolong the QT interval:  Torsades de pointes risk may be increased with concurrent use - avoid if possible.  Monitor QTc,  also keep magnesium/potassium WNL if concurrent therapy can't be avoided. [name] Antibiotics: e.g. fluoroquinolones, erythromycin. . Antiarrhythmics: e.g. quinidine, procainamide, disopyramide, sotalol. . Antipsychotics: e.g. phenothiazines, haloperidol.  . Lithium, tricyclic antidepressants, and methadone. Thank You,  Wayland Salinas  06/11/2017 10:17 AM

## 2017-06-11 NOTE — Progress Notes (Signed)
Patient ambulated in hallway with wife. Back in room will monitor patient. Armand Preast, Bettina Gavia rN

## 2017-06-11 NOTE — Progress Notes (Signed)
Patient assist x1 up to chair for breakfast will monitor patient. Erica Osuna, Bettina Gavia rN

## 2017-06-11 NOTE — Progress Notes (Signed)
Patient heart rate in low 100s, patient ambulated in hallway with wife. Back in room to chair to eat. Will monitor patient [name] Brown, [name] Gavia rN

## 2017-06-11 NOTE — Progress Notes (Addendum)
      [address]       [place], [address]             [phone]      3 Days Post-Op Procedure(s) (LRB): AORTIC VALVE REPLACEMENT (AVR) (N/A) TRANSESOPHAGEAL ECHOCARDIOGRAM (TEE) (N/A)   Subjective:  [name] Brown has no complaints.  States he is doing okay.  He is hoping he can go home tomorrow.  + ambulation  No BM  Objective: Vital signs in last 24 hours: Temp:  [97.8 F (36.6 C)-98.7 F (37.1 C)] 98.3 F (36.8 C) (09/29 1546) Pulse Rate:  [50-101] 93 (09/29 1630) Cardiac Rhythm: Sinus tachycardia;Bundle branch block (09/29 2257) Resp:  [10-28] 26 (09/29 1630) BP: (102-145)/(56-82) 145/82 (09/29 1630) SpO2:  [87 %-97 %] 95 % (09/29 1630) Weight:  [177 lb (80.3 kg)] 177 lb (80.3 kg) (09/29 1648)  Intake/Output from previous day: 09/29 0701 - 09/30 0700 In: 850 [P.O.:720; I.V.:80; IV Piggyback:50] Out: 825 [Urine:825] Intake/Output this shift: Total I/O In: 240 [P.O.:240] Out: 400 [Urine:400]  General appearance: alert, cooperative and no distress Heart: regular rate and rhythm Lungs: clear to auscultation bilaterally Abdomen: soft, non-tender; bowel sounds normal; no masses,  no organomegaly Extremities: edema trace Wound: clean and dry  Lab Results:  Recent Labs  06/10/17 0405 06/11/17 0401  WBC 12.3* 9.1  HGB 10.2* 9.5*  HCT 29.9* 27.6*  PLT 130* 128*   BMET:  Recent Labs  06/10/17 0405 06/11/17 0401  NA 130* 132*  K 4.2 4.0  CL 97* 97*  CO2 27 28  GLUCOSE 139* 131*  BUN 13 12  CREATININE 1.06 0.99  CALCIUM 8.0* 8.1*    PT/INR:  Recent Labs  06/08/17 1214  LABPROT 19.3*  INR 1.64   ABG    Component Value Date/Time   PHART 7.404 06/08/2017 1608   HCO3 24.4 06/08/2017 1608   TCO2 25 06/09/2017 1729   ACIDBASEDEF 1.0 06/08/2017 1608   O2SAT 99.0 06/08/2017 1608   CBG (last 3)   Recent Labs  06/10/17 0318 06/10/17 0725 06/10/17 1544  GLUCAP 135* 121* 154*    Assessment/Plan: S/P Procedure(s)  (LRB): AORTIC VALVE REPLACEMENT (AVR) (N/A) TRANSESOPHAGEAL ECHOCARDIOGRAM (TEE) (N/A)  1. CV- Sinus Tach with PVCs, + HTN- will restart home Cozaar, per patients request will switch to Toprol XL which he states works better for him ( as he has been on both forms before) 2. Pulm- no acute issues, continue IS 3. Renal- creatinine WNL, weight is trending down, continue Lasix per orders 4. Expected post operative blood loss anemia, Hgb stable at 9.5 5. GI- LOC constipation, will order Lactulose prn 6. Dispo- patient stable, start home Cozaar for mild HTN, transition to Toprol XL per patient request, if no arrythmia, will d/c EPW in AM, continue diuretics   LOS: 3 days    BARRETT, ERIN 06/11/2017  afib today, now sinus with pacs on po Cordarone I have seen and examined [name] Brown and agree with the above assessment  and plan.  Grace Isaac MD Beeper [phone] Office [phone] 06/11/2017 7:46 PM

## 2017-06-12 MED ORDER — METOPROLOL TARTRATE 25 MG PO TABS
25.0000 mg | ORAL_TABLET | Freq: Two times a day (BID) | ORAL | Status: DC
  Administered 2017-06-12 – 2017-06-14 (×5): 25 mg via ORAL
  Filled 2017-06-12 (×5): qty 1

## 2017-06-12 NOTE — Progress Notes (Signed)
1030-1100 Education completed with pt and wife who voiced understanding. Stressed importance of sternal precautions and IS. Encouraged pt to weigh daily and to watch sodium. Low sodium diets given. Discussed CRP 2 and will refer to [place] program. Pt has been walking independently so will sign off.  Ex ed completed also. Pt to discuss alcohol with surgeon. Graylon Good RN BSN 06/12/2017 11:10 AM

## 2017-06-12 NOTE — Progress Notes (Addendum)
      [address]       [place] [address]             [phone]      4 Days Post-Op Procedure(s) (LRB): AORTIC VALVE REPLACEMENT (AVR) (N/A) TRANSESOPHAGEAL ECHOCARDIOGRAM (TEE) (N/A)   Subjective:  [name] Brown has no new complaints.  Glad to be out of Atrial Fibrillation.  + ambulation independently.  + BM  Objective: Vital signs in last 24 hours: Temp:  [99.4 F (37.4 C)-100 F (37.8 C)] 99.5 F (37.5 C) (10/01 0500) Pulse Rate:  [96-110] 96 (10/01 0500) Cardiac Rhythm: Normal sinus rhythm;Bundle branch block (10/01 0702) Resp:  [18-22] 18 (10/01 0500) BP: (94-132)/(65-79) 132/79 (10/01 0500) SpO2:  [91 %-97 %] 95 % (10/01 0500) Weight:  [169 lb 11.2 oz (77 kg)] 169 lb 11.2 oz (77 kg) (10/01 0500)  Intake/Output from previous day: 09/30 0701 - 10/01 0700 In: 704 [P.O.:600; I.V.:104] Out: 1550 [Urine:1550]  General appearance: alert, cooperative and no distress Heart: regular rate and rhythm Lungs: clear to auscultation bilaterally Abdomen: soft, non-tender; bowel sounds normal; no masses,  no organomegaly Extremities: edema trace Wound: clean and drfy  Lab Results:  Recent Labs  06/10/17 0405 06/11/17 0401  WBC 12.3* 9.1  HGB 10.2* 9.5*  HCT 29.9* 27.6*  PLT 130* 128*   BMET:  Recent Labs  06/10/17 0405 06/11/17 0401  NA 130* 132*  K 4.2 4.0  CL 97* 97*  CO2 27 28  GLUCOSE 139* 131*  BUN 13 12  CREATININE 1.06 0.99  CALCIUM 8.0* 8.1*    PT/INR: No results for input(s): LABPROT, INR in the last 72 hours. ABG    Component Value Date/Time   PHART 7.404 06/08/2017 1608   HCO3 24.4 06/08/2017 1608   TCO2 25 06/09/2017 1729   ACIDBASEDEF 1.0 06/08/2017 1608   O2SAT 99.0 06/08/2017 1608   CBG (last 3)   Recent Labs  06/10/17 0318 06/10/17 0725 06/10/17 1544  GLUCAP 135* 121* 154*    Assessment/Plan: S/P Procedure(s) (LRB): AORTIC VALVE REPLACEMENT (AVR) (N/A) TRANSESOPHAGEAL ECHOCARDIOGRAM (TEE) (N/A)  1. CV-  Atrial Fibrillation, converted after IV Bolus of Amiodarone, - will increase Lopressor to 25 mg BID, continue Amiodarone, Cozaar for BP control 2. Pulm- no acute issues, continue IS 3. Renal- creatinine WNL, weight is below admission weight, will d/c Lasix 4. GI- constipation resolved 5. Dispo- patient stable, maintaining NSR, will get EKG for better look at rhythm, d/c EPW, titrate BB, likely for d/c in AM   LOS: 4 days    BARRETT, ERIN 06/12/2017   Chart reviewed, patient examined, agree with above. Brief atrial fib converted with amio. If he maintains sinus he could go home tomorrow. He is doing well otherwise. Discussed with his wife.

## 2017-06-12 NOTE — Progress Notes (Signed)
EPWs removed per protocol.  Instructed on need for 1 hr of bedrest with VS monitoring.  Will continue to monitor.

## 2017-06-12 NOTE — Care Management Important Message (Signed)
Important Message  Patient Details  Name: [name] Brown MRN: [id number] Date of Birth: 31-Dec-1950   Medicare Important Message Given:  Yes    Nathen May 06/12/2017, 9:25 AM

## 2017-06-13 ENCOUNTER — Encounter (HOSPITAL_COMMUNITY): Payer: Self-pay | Admitting: Physician Assistant

## 2017-06-13 DIAGNOSIS — I480 Paroxysmal atrial fibrillation: Secondary | ICD-10-CM

## 2017-06-13 DIAGNOSIS — I484 Atypical atrial flutter: Secondary | ICD-10-CM

## 2017-06-13 DIAGNOSIS — Z952 Presence of prosthetic heart valve: Secondary | ICD-10-CM

## 2017-06-13 HISTORY — DX: Paroxysmal atrial fibrillation: I48.0

## 2017-06-13 LAB — BASIC METABOLIC PANEL
Anion gap: 8 (ref 5–15)
BUN: 15 mg/dL (ref 6–20)
CALCIUM: 8.8 mg/dL — AB (ref 8.9–10.3)
CO2: 29 mmol/L (ref 22–32)
CREATININE: 1.01 mg/dL (ref 0.61–1.24)
Chloride: 97 mmol/L — ABNORMAL LOW (ref 101–111)
Glucose, Bld: 124 mg/dL — ABNORMAL HIGH (ref 65–99)
Potassium: 3.8 mmol/L (ref 3.5–5.1)
SODIUM: 134 mmol/L — AB (ref 135–145)

## 2017-06-13 LAB — CBC
HCT: 29 % — ABNORMAL LOW (ref 39.0–52.0)
Hemoglobin: 9.6 g/dL — ABNORMAL LOW (ref 13.0–17.0)
MCH: 32.3 pg (ref 26.0–34.0)
MCHC: 33.1 g/dL (ref 30.0–36.0)
MCV: 97.6 fL (ref 78.0–100.0)
Platelets: 232 10*3/uL (ref 150–400)
RBC: 2.97 MIL/uL — AB (ref 4.22–5.81)
RDW: 13.5 % (ref 11.5–15.5)
WBC: 6.3 10*3/uL (ref 4.0–10.5)

## 2017-06-13 MED ORDER — IBUPROFEN 600 MG PO TABS
600.0000 mg | ORAL_TABLET | Freq: Four times a day (QID) | ORAL | Status: DC
  Administered 2017-06-13 – 2017-06-14 (×3): 600 mg via ORAL
  Filled 2017-06-13 (×3): qty 1

## 2017-06-13 MED ORDER — ASPIRIN 325 MG PO TBEC: 325.0000 mg | DELAYED_RELEASE_TABLET | Freq: Every day | ORAL | 0 refills | Status: DC

## 2017-06-13 MED ORDER — ACETAMINOPHEN 325 MG PO TABS: 650.0000 mg | ORAL_TABLET | Freq: Four times a day (QID) | ORAL | Status: DC | PRN

## 2017-06-13 MED ORDER — OXYCODONE HCL 5 MG PO TABS: ORAL_TABLET | ORAL | 0 refills | Status: DC

## 2017-06-13 MED ORDER — POTASSIUM CHLORIDE CRYS ER 20 MEQ PO TBCR
40.0000 meq | EXTENDED_RELEASE_TABLET | Freq: Once | ORAL | Status: AC
  Administered 2017-06-13: 40 meq via ORAL
  Filled 2017-06-13: qty 2

## 2017-06-13 MED ORDER — PANTOPRAZOLE SODIUM 40 MG PO TBEC
40.0000 mg | DELAYED_RELEASE_TABLET | Freq: Every day | ORAL | Status: DC
  Administered 2017-06-14: 40 mg via ORAL
  Filled 2017-06-13: qty 1

## 2017-06-13 NOTE — Progress Notes (Signed)
Patient HR at 140s,wide QRS tachycardia,confirmed with 12 leads EKG.Metoprolol 5 mg PRN given.Pt converted back to NSR.Will continue to monitor.

## 2017-06-13 NOTE — Progress Notes (Addendum)
Patient complaining of sharp burning pain in his back, neck, and right shoulder.  He was sitting in the chair when this developed.  He also became sweaty.  He denies chest pain, nausea, and vomiting.      BP 120/70 (BP Location: Right Arm)   Pulse 98   Temp 100.1 F (37.8 C) (Oral)   Resp 20   Ht 5\' 6"  (1.676 m)   Wt 167 lb 8 oz (76 kg)   SpO2 94%   BMI 27.04 kg/m    Gen:  Appears clammy, pale Heart: RRR, tachy Lungs; CTA Abd: soft non-tender, non-distended Wounds: clean and dry  A/P:  1. Patient has been having episodes of Atrial fibrillation and SVT- currently he is NSR with PVCS, LBBB... Cardiology to follow up on patient.. 2. Febrile- fever of 100.1, no obvious source of wound infection.. Last wbc was normal.. Will repeat CBC and BEMT. Possibly check blood cultures as patient has a valve in place.. Will discuss with Dr. Cyndia Bent 3. Pulm- off oxygen, good use of IS.[name] CXR from 9/30 looked okay with small pleural effusion/atelectasis 4. Dispo- continue to monitor patient, Dr. Cyndia Bent is aware and will check on patient   Chart reviewed, patient examined, agree with above. He has a low grade fever and nonspecific pain in back, neck and right shoulder that he thinks may be muscular from a long walk yesterday over to the [employer]	[place]. 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 Will give have some Ibuprofen for a couple days. His WBC ct is normal and incision looks fine. I don't think he needs cultures. He is still having some short runs of A fib but mostly sinus. Will continue Lopressor and Amiodarone. I would not anticoagulate at this time.

## 2017-06-13 NOTE — Progress Notes (Addendum)
      [address]       [place] [address]             [phone]        5 Days Post-Op Procedure(s) (LRB): AORTIC VALVE REPLACEMENT (AVR) (N/A) TRANSESOPHAGEAL ECHOCARDIOGRAM (TEE) (N/A)  Subjective: Patient states he was told he had a run of sinus tach last night, but was given medicine and It stopped.  Objective: Vital signs in last 24 hours: Temp:  [100.1 F (37.8 C)] 100.1 F (37.8 C) (10/01 1944) Pulse Rate:  [98] 98 (10/01 1944) Cardiac Rhythm: Atrial fibrillation;Bundle branch block (10/02 0700) Resp:  [20] 20 (10/02 0500) BP: (120)/(70) 120/70 (10/01 1944) SpO2:  [94 %] 94 % (10/01 1944) Weight:  [167 lb 8 oz (76 kg)] 167 lb 8 oz (76 kg) (10/02 0500)  Pre op weight 78.7 kg Current Weight  06/13/17 167 lb 8 oz (76 kg)       Intake/Output from previous day: 10/01 0701 - 10/02 0700 In: 240 [P.O.:240] Out: 600 [Urine:600]   Physical Exam:  Cardiovascular: RRR Pulmonary: Clear to auscultation bilaterally Abdomen: Soft, non tender, bowel sounds present. Extremities: No lower extremity edema. Wounds: Clean and dry.  No erythema or signs of infection.  Lab Results: CBC: Recent Labs  06/11/17 0401  WBC 9.1  HGB 9.5*  HCT 27.6*  PLT 128*   BMET:  Recent Labs  06/11/17 0401  NA 132*  K 4.0  CL 97*  CO2 28  GLUCOSE 131*  BUN 12  CREATININE 0.99  CALCIUM 8.1*    PT/INR:  Lab Results  Component Value Date   INR 1.64 06/08/2017   INR 0.97 06/06/2017   INR 1.0 10/25/2016   ABG:  INR: Will add last result for INR, ABG once components are confirmed Will add last 4 CBG results once components are confirmed  Assessment/Plan:  1. CV - Previous a fib. He appeared to have a run of SVT last night and was given IV Lopressor. Maintaining SR this am,first degree heart block. On Amiodarone 400 mg bid, Losartan 25 mg daily, and Lopressor 25 mg bid. Will discuss if needs anticoagulation. 2.  Pulmonary - On room air. Encourage  incentive spirometer. 3. Volume Overload - On Lasix 40 mg daily. 4.  Acute blood loss anemia - Last H and H 9.5 and 27.6. 5. Will hold discharge and observe on tele.   ZIMMERMAN,DONIELLE MPA-C 06/13/2017,7:23 AM

## 2017-06-13 NOTE — Consult Note (Signed)
Cardiology Consultation:   Patient ID: Loys Shugars; [id number]; 06-Sep-1951   Admit date: 06/08/2017 Date of Consult: 06/13/2017  Primary Care Provider: [name], MD Primary Cardiologist: Dr Oval Linsey 02/04/2107 Primary Electrophysiologist:  n/a   Patient Profile:   Donyell Ding is a 66 y.o. male with a hx of mild-mod CAD at cath 10/2016 rx medically, S-D-CHF w/ EF 30-35% by echo 09/2016, Severe AS, colon CA s/p colectomy, LBBB who is being seen today for the evaluation of arrhythmia at the request of Dr Cyndia Bent.  History of Present Illness:   Mr. Gaby was admitted 09/28 for AVR. He had 25 mm [facility] Ease Pericardial Tissue Valve inserted and tolerated the procedure well. He was walking independently 10/01 and was to be discharged in 24-48 hr.   However, he had some WCT tachycardia this am w/ HR 140s>>IV Lopressor 5 mg given>>SR. Dr Cyndia Bent notes that he has been having atrial fib and SVT. LBBB at baseline.   Mr Economou was aware of the episodes. He had one episode at about 8 am, one at about 3 am. Neither started with exertion. He was diaphoretic and felt his heart pounding. The 3 am episode did not wake him, but he felt the sx when wakened. He was converted with IV metoprolol and the sx resolved. Never had the sx before. His sx correspond with 2 episodes of rapid atrial fib, HR >140 at times.  Today, he had some pain in his R shoulder, back and neck, burning pain. He also has had a low-grade temp. He has felt a little weaker as well. The surgery was Thursday, the weakness and fever began on Saturday. The pain did not start till today. He was not having any symptoms like these before the surgery.   Past Medical History:  Diagnosis Date  . Aortic stenosis   . Colon cancer ([place])   . Family history of adverse reaction to anesthesia    pt states his mother had 2 episodes of hyponatremia following anesthesia   . GERD (gastroesophageal reflux disease)   . Headache   .  Heart murmur   . Hyperlipidemia 10/25/2016  . Hypertension    pt is currently not taking any medications; pt states is borderline  . Melanoma ([place])   . PAF (paroxysmal atrial fibrillation) ([place]) 06/13/2017  . Perforated sigmoid colon ([place])   . PVC's (premature ventricular contractions) 10/25/2016  . Tinnitus   . Wears glasses     Past Surgical History:  Procedure Laterality Date  . AORTIC VALVE REPLACEMENT N/A 06/08/2017   Procedure: AORTIC VALVE REPLACEMENT (AVR);  Surgeon: [name], MD;  Location: [place];  Service: Open Heart Surgery;  Laterality: N/A;  Using 29mm Perimount Magna Ease Pericardial Bioprosthesis Aortic Valve  . COLOSTOMY CLOSURE N/A 08/18/2016   Procedure: COLOSTOMY CLOSURE OPEN PROCEDURE;  Surgeon: Armandina Gemma, MD;  Location: [facility];  Service: General;  Laterality: N/A;  . LAPAROTOMY N/A 04/25/2016   Procedure: EXPLORATORY LAPAROTOMY WITH SIGMOID COLECTOMY, COLOSTOMY;  Surgeon: Armandina Gemma, MD;  Location: [facility];  Service: General;  Laterality: N/A;  . LEFT HEART CATH AND CORONARY ANGIOGRAPHY N/A 11/03/2016   Procedure: Left Heart Cath and Coronary Angiography;  Surgeon: Burnell Blanks, MD;  Location: [place] [facility];  Service: Cardiovascular;  Laterality: N/A;  . MELANOMA EXCISION    . PARTIAL COLECTOMY Right 08/18/2016   Procedure: RIGHT COLECTOMY;  Surgeon: Armandina Gemma, MD;  Location: [facility];  Service: General;  Laterality: Right;  .  TEE WITHOUT CARDIOVERSION N/A 06/08/2017   Procedure: TRANSESOPHAGEAL ECHOCARDIOGRAM (TEE);  Surgeon: [name], MD;  Location: [place];  Service: Open Heart Surgery;  Laterality: N/A;     Inpatient Medications: Scheduled Meds: . amiodarone  400 mg Oral BID  . aspirin EC  325 mg Oral Daily  . ezetimibe  10 mg Oral Daily  . ibuprofen  600 mg Oral QID  . losartan  25 mg Oral Daily  . mouth rinse  15 mL Mouth Rinse BID  . metoprolol tartrate  25 mg Oral BID  . [START ON 06/14/2017] pantoprazole  40 mg Oral Daily  .  sodium chloride flush  3 mL Intravenous Q12H   Continuous Infusions: . sodium chloride     PRN Meds: sodium chloride, acetaminophen, famotidine, lactulose, metoprolol tartrate, ondansetron **OR** ondansetron (ZOFRAN) IV, oxyCODONE, sodium chloride flush, traMADol Prior to Admission medications   Medication Sig Start Date End Date Taking? Authorizing Provider  aspirin EC 81 MG tablet Take 81 mg by mouth daily.   Yes [provider]  ezetimibe (ZETIA) 10 MG tablet Take 1 tablet (10 mg total) by mouth daily. 03/22/17 06/20/17 Yes Skeet Latch, MD  famotidine (PEPCID) 20 MG tablet Take 20 mg by mouth daily as needed for heartburn or indigestion.   Yes [provider]  losartan (COZAAR) 25 MG tablet Take 1 tablet (25 mg total) by mouth daily. 04/14/17 07/13/17 Yes Skeet Latch, MD  MELATONIN PO Take 1 tablet by mouth at bedtime as needed (sleep).   Yes [provider]  metoprolol succinate (TOPROL-XL) 25 MG 24 hr tablet Take 1 tablet (25 mg total) by mouth daily. Take with or immediately following a meal. 11/29/16 06/05/17 Yes Skeet Latch, MD  acetaminophen (TYLENOL) 325 MG tablet Take 2 tablets (650 mg total) by mouth every 6 (six) hours as needed for mild pain. 06/13/17   Nani Skillern, PA-C  aspirin EC 325 MG EC tablet Take 1 tablet (325 mg total) by mouth daily. 06/13/17   Nani Skillern, PA-C  oxyCODONE (OXY IR/ROXICODONE) 5 MG immediate release tablet Take 5 mg by mouth every 4-6 hours PRN severe pain. 06/13/17   Nani Skillern, PA-C    Allergies:   No Known Allergies  Social History:   Social History   Social History  . Marital status: Married    Spouse name: N/A  . Number of children: N/A  . Years of education: N/A   Occupational History  . Retired Software engineer    Social History Main Topics  . Smoking status: Never Smoker  . Smokeless tobacco: Never Used  . Alcohol use Yes     Comment: 2-3 glasses of wine daily   . Drug  use: No  . Sexual activity: Not on file   Other Topics Concern  . Not on file   Social History Narrative   Pt lives in [place] with his wife, they are active.    Family History:   The patient's family history includes Alcohol abuse in his father; CAD in his mother; Cirrhosis in his father; Dementia in his mother; Heart attack in his father; Kidney disease in his mother; Stroke in his maternal grandmother and paternal grandmother; Vascular Disease in his mother. Pt indicated that his mother is deceased. He indicated that his father is deceased. He indicated that his maternal grandmother is deceased. He indicated that his maternal grandfather is deceased. He indicated that his paternal grandmother is deceased. He indicated that his paternal grandfather is  deceased.    ROS:  Please see the history of present illness.  All other ROS reviewed and negative.      Physical Exam/Data:   Vitals:   06/12/17 0500 06/12/17 1944 06/13/17 0500 06/13/17 1424  BP: 132/79 120/70  109/76  Pulse: 96 98  88  Resp: 18 20 20 18   Temp: 99.5 F (37.5 C) 100.1 F (37.8 C)  100 F (37.8 C)  TempSrc: Oral Oral  Oral  SpO2: 95% 94%  93%  Weight: 169 lb 11.2 oz (77 kg)  167 lb 8 oz (76 kg)   Height:        Intake/Output Summary (Last 24 hours) at 06/13/17 1720 Last data filed at 06/13/17 1300  Gross per 24 hour  Intake              360 ml  Output              600 ml  Net             -240 ml   Filed Weights   06/11/17 0555 06/12/17 0500 06/13/17 0500  Weight: 175 lb 14.4 oz (79.8 kg) 169 lb 11.2 oz (77 kg) 167 lb 8 oz (76 kg)   Body mass index is 27.04 kg/m.  General:  Well nourished, well developed, in no acute distress HEENT: normal Lymph: no adenopathy Neck: no JVD Endocrine:  No thryomegaly Vascular: No carotid bruits; 4/4 extremity pulses 2+ bilaterally Cardiac:  normal S1, S2; RRR; soft murmur, NO rub Lungs:  Decreased BS bases bilaterally, no wheezing, rhonchi, few rales  Abd:  soft, nontender, no hepatomegaly  Ext: no edema Musculoskeletal:  No deformities, BUE and BLE strength normal and equal Skin: warm and dry; incision healing well, no signs of infection  Neuro:  CNs 2-12 intact, no focal abnormalities noted Psych:  Normal affect   EKG:  The EKG was personally reviewed and demonstrates: 10/02 at 2:58 am, looks like Atrial tach vs flutter, HR138, LBBB, occ PVCs; 10/02 at 14:20, SR, LBBB, HR 91 Telemetry:  Telemetry was personally reviewed and demonstrates:  SR, LBBB, 2 episodes of atrial fib/flutter/atrial tach, 5 bt run NSVT  Relevant CV Studies:  OP NOTE: 06/08/2017 1. Median Sternotomy 2. Extracorporeal circulation 3.   Aortic valve replacement using a 25 mm Edwards Magna-Ease pericardial valve.  TEE: 06/08/2017  Mitral valve: No leaflet thickening and calcification present.  Left ventricle: Normal cavity size, wall thickness and left ventricular diastolic function. LV systolic function is mildly to moderately reduced with an EF of 40-45%. There are no obvious wall motion abnormalities. No thrombus present. No mass present.  Right ventricle: Normal cavity size, wall thickness and ejection fraction. No thrombus present. No mass present.  Aortic valve: Moderate valve calcification present. Moderate stenosis. Mild regurgitation.  Laboratory Data:  Chemistry  Recent Labs Lab 06/10/17 0405 06/11/17 0401 06/13/17 1506  NA 130* 132* 134*  K 4.2 4.0 3.8  CL 97* 97* 97*  CO2 27 28 29   GLUCOSE 139* 131* 124*  BUN 13 12 15   CREATININE 1.06 0.99 1.01  CALCIUM 8.0* 8.1* 8.8*  GFRNONAA >60 >60 >60  GFRAA >60 >60 >60  ANIONGAP 6 7 8     Lab Results  Component Value Date   ALT 42 06/06/2017   AST 31 06/06/2017   ALKPHOS 54 06/06/2017   BILITOT 0.9 06/06/2017    Hematology  Recent Labs Lab 06/10/17 0405 06/11/17 0401 06/13/17 1506  WBC 12.3* 9.1 6.3  RBC 3.12*  2.85* 2.97*  HGB 10.2* 9.5* 9.6*  HCT 29.9* 27.6* 29.0*  MCV 95.8 96.8 97.6   MCH 32.7 33.3 32.3  MCHC 34.1 34.4 33.1  RDW 13.4 13.6 13.5  PLT 130* 128* 232   Lab Results  Component Value Date   HGBA1C 5.1 06/06/2017    Radiology/Studies:  Dg Chest 2 View  Result Date: 06/11/2017 CLINICAL DATA:  Status post aortic valve replacement EXAM: CHEST  2 VIEW COMPARISON:  Chest radiograph 06/10/2017 FINDINGS: The sheath has been removed from the right internal jugular vein. There is persistent left basilar consolidation. Mediastinal drains have been removed. IMPRESSION: Unchanged appearance of left lower lobe consolidation, likely atelectasis, with small pleural effusion. Electronically Signed   By: Ulyses Jarred M.D.   On: 06/11/2017 19:04   Dg Chest Port 1 View  Result Date: 06/10/2017 CLINICAL DATA:  Status post aortic valve replacement EXAM: PORTABLE CHEST 1 VIEW COMPARISON:  Yesterday FINDINGS: Normal heart size and mediastinal contours. Thoracic drains remain. Swan-Ganz catheter has been removed. Small left pleural effusion with retrocardiac atelectasis. Negative for pneumothorax. Stable postoperative heart size with cardiomegaly. Status post aortic valve replacement. IMPRESSION: Swan-Ganz catheter has been removed. Otherwise, stable from yesterday. Retrocardiac atelectasis and small left effusion. No visible pneumothorax. Electronically Signed   By: Monte Fantasia M.D.   On: 06/10/2017 09:34    Assessment and Plan:   Principal Problem:   S/P AVR (aortic valve replacement) - pt doing well, per TCTS - should be ready for d/c soon  Active Problems:   PAF (paroxysmal atrial fibrillation) ([facility]) - 2 episodes seen, could be atrial tach vs atypical atrial flutter, converted with 5 mg IV Lopressor each time - symptomatic but no presyncope or syncope - He had never had the sx associated with the afib before - was been started on amio at 400 mg bid 09/27 - He has f/u appt with TCTS and cards already. - CHADS2VASC= 4 (age x 1, CHF, HTN, CAD)> no anticoagulation for  now, will have to reconsider if he has additional episodes. - If pt has any more episodes overnight>>EP needs to see - if not, could d/c on the amio and consider stopping it in a month or so if pt does well.    Signed, Lenoard Aden  06/13/2017 5:20 PM   I have examined the patient and reviewed assessment and plan and discussed with patient.  Agree with above as stated.  I personally reviewed the patient's tele.  It appears he has atrial flutter at a rate of approximately 150 bpm.  COntinue Amio.  If this persists, will have EP see the patient.  If episodes recur, would have to reconsider anticoagulation.  Hopefully, this will resolve the further away he gets from the surgery.   Larae Grooms

## 2017-06-14 ENCOUNTER — Telehealth (HOSPITAL_COMMUNITY): Payer: Self-pay

## 2017-06-14 MED ORDER — AMIODARONE HCL 400 MG PO TABS: 400.0000 mg | ORAL_TABLET | Freq: Two times a day (BID) | ORAL | 1 refills | Status: DC

## 2017-06-14 MED ORDER — IBUPROFEN 600 MG PO TABS
600.0000 mg | ORAL_TABLET | Freq: Four times a day (QID) | ORAL | 0 refills | Status: AC
Start: 2017-06-14 — End: 2017-06-18

## 2017-06-14 MED ORDER — METOPROLOL TARTRATE 25 MG PO TABS: 25.0000 mg | ORAL_TABLET | Freq: Two times a day (BID) | ORAL | 3 refills | Status: DC

## 2017-06-14 NOTE — Progress Notes (Signed)
Discussed with the patient and all questioned fully answered. He will call me if any problems arise.  IV removed. Telemetry removed. Pt given paper prescriptions for amiodarone and oxycodone. CT sutures remain intact per Dr. Vivi Martens orders - suture removal appointment noted in discharge paperwork. Give discharge instructions in discharge envelope.   Fritz Pickerel, RN

## 2017-06-14 NOTE — Progress Notes (Signed)
      [address]       [place] [address]             [phone]      6 Days Post-Op Procedure(s) (LRB): AORTIC VALVE REPLACEMENT (AVR) (N/A) TRANSESOPHAGEAL ECHOCARDIOGRAM (TEE) (N/A)   Subjective:  No new complaints.  States he is feeling much better after administration of Ibuprofen.  He is hoping to go home today.   Objective: Vital signs in last 24 hours: Temp:  [99.3 F (37.4 C)-100 F (37.8 C)] 99.3 F (37.4 C) (10/03 0604) Pulse Rate:  [87-91] 87 (10/03 0604) Cardiac Rhythm: Normal sinus rhythm;Bundle branch block (10/02 1900) Resp:  [15-25] 25 (10/03 0604) BP: (109-127)/(68-76) 119/68 (10/03 0604) SpO2:  [92 %-94 %] 92 % (10/03 0604) Weight:  [166 lb 12.8 oz (75.7 kg)] 166 lb 12.8 oz (75.7 kg) (10/03 0500)  Intake/Output from previous day: 10/02 0701 - 10/03 0700 In: 600 [P.O.:600] Out: 950 [Urine:950]  General appearance: alert, cooperative and no distress Heart: regular rate and rhythm Lungs: clear to auscultation bilaterally Abdomen: soft, non-tender; bowel sounds normal; no masses,  no organomegaly Extremities: extremities normal, atraumatic, no cyanosis or edema Wound: clean and dry  Lab Results:  Recent Labs  06/13/17 1506  WBC 6.3  HGB 9.6*  HCT 29.0*  PLT 232   BMET:  Recent Labs  06/13/17 1506  NA 134*  K 3.8  CL 97*  CO2 29  GLUCOSE 124*  BUN 15  CREATININE 1.01  CALCIUM 8.8*    PT/INR: No results for input(s): LABPROT, INR in the last 72 hours. ABG    Component Value Date/Time   PHART 7.404 06/08/2017 1608   HCO3 24.4 06/08/2017 1608   TCO2 25 06/09/2017 1729   ACIDBASEDEF 1.0 06/08/2017 1608   O2SAT 99.0 06/08/2017 1608   CBG (last 3)  No results for input(s): GLUCAP in the last 72 hours.  Assessment/Plan: S/P Procedure(s) (LRB): AORTIC VALVE REPLACEMENT (AVR) (N/A) TRANSESOPHAGEAL ECHOCARDIOGRAM (TEE) (N/A)  1. CV- Episodic A. Fib/Flutter, underlying LBBB- currently NSR, continue Amiodarone,  Lopressor, Cozaar...anticoagulation not indicated currently 2. Possible Pericarditis- continue Ibuprofen 3. Pulm- no acute issues, off oxygen, continue IS 4. Renal- creatinine stable, weight is at baseline-stop lasix, potassium 5. DIspo- patient feels better after ibuprofen, rhythm appeared stable overnight, will await Dr. Earlene Plater recommendations for possible discharge today   LOS: 6 days    Katonya Blecher 06/14/2017

## 2017-06-14 NOTE — Care Management Note (Signed)
Case Management Note Original Note Created Zenon Mayo, RN 06/09/2017, 10:48 AM   Patient Details  Name: [name] Brown MRN: [id number] Date of Birth: 1951/07/15  Subjective/Objective:    From home,  POD 1 AVR , chest tube, cont to diuresis.               Action/Plan: NCM will follow for dc needs.   Expected Discharge Date:  06/14/17               Expected Discharge Plan:  Home/Self Care  In-House Referral:  NA  Discharge planning Services  CM Consult  Post Acute Care Choice:  NA Choice offered to:  NA  DME Arranged:    DME Agency:     HH Arranged:    HH Agency:     Status of Service:  Completed, signed off  If discussed at [place] of Stay Meetings, dates discussed:    Discharge Disposition: home/self care   Additional Comments:  06/14/17- 1500- Ruby Dilone RN, CM- pt for d/c home today- no CM needs noted for discharge.   Dahlia Client Mercedes, RN 06/14/2017, 3:01 PM [phone]

## 2017-06-14 NOTE — Progress Notes (Signed)
Progress Note  Patient Name: [name] Brown Date of Encounter: 06/14/2017  Primary Cardiologist: Dr. Oval Linsey  Subjective   No palpitations or chest pain.  Inpatient Medications    Scheduled Meds: . amiodarone  400 mg Oral BID  . aspirin EC  325 mg Oral Daily  . ezetimibe  10 mg Oral Daily  . ibuprofen  600 mg Oral QID  . losartan  25 mg Oral Daily  . mouth rinse  15 mL Mouth Rinse BID  . metoprolol tartrate  25 mg Oral BID  . pantoprazole  40 mg Oral Daily  . sodium chloride flush  3 mL Intravenous Q12H   Continuous Infusions: . sodium chloride     PRN Meds: sodium chloride, acetaminophen, famotidine, lactulose, metoprolol tartrate, ondansetron **OR** ondansetron (ZOFRAN) IV, oxyCODONE, sodium chloride flush, traMADol   Vital Signs    Vitals:   06/13/17 1746 06/13/17 2031 06/14/17 0500 06/14/17 0604  BP:  127/69  119/68  Pulse:  91  87  Resp:  15 (!) 25 (!) 25  Temp: 99.3 F (37.4 C)   99.3 F (37.4 C)  TempSrc:    Oral  SpO2:  94%  92%  Weight:   166 lb 12.8 oz (75.7 kg)   Height:        Intake/Output Summary (Last 24 hours) at 06/14/17 1152 Last data filed at 06/13/17 1746  Gross per 24 hour  Intake              360 ml  Output              650 ml  Net             -290 ml   Filed Weights   06/12/17 0500 06/13/17 0500 06/14/17 0500  Weight: 169 lb 11.2 oz (77 kg) 167 lb 8 oz (76 kg) 166 lb 12.8 oz (75.7 kg)    Telemetry     NSR- Personally Reviewed  ECG      Physical Exam   GEN: No acute distress.   Neck: No JVD Cardiac: RRR, no murmurs, rubs, or gallops.  Respiratory: Clear to auscultation bilaterally. GI: Soft, nontender, non-distended  MS: No edema; No deformity. Neuro:  Nonfocal  Psych: Normal affect   Labs    Chemistry Recent Labs Lab 06/10/17 0405 06/11/17 0401 06/13/17 1506  NA 130* 132* 134*  K 4.2 4.0 3.8  CL 97* 97* 97*  CO2 27 28 29   GLUCOSE 139* 131* 124*  BUN 13 12 15   CREATININE 1.06 0.99 1.01  CALCIUM 8.0*  8.1* 8.8*  GFRNONAA >60 >60 >60  GFRAA >60 >60 >60  ANIONGAP 6 7 8      Hematology Recent Labs Lab 06/10/17 0405 06/11/17 0401 06/13/17 1506  WBC 12.3* 9.1 6.3  RBC 3.12* 2.85* 2.97*  HGB 10.2* 9.5* 9.6*  HCT 29.9* 27.6* 29.0*  MCV 95.8 96.8 97.6  MCH 32.7 33.3 32.3  MCHC 34.1 34.4 33.1  RDW 13.4 13.6 13.5  PLT 130* 128* 232    Cardiac EnzymesNo results for input(s): TROPONINI in the last 168 hours. No results for input(s): TROPIPOC in the last 168 hours.   BNPNo results for input(s): BNP, PROBNP in the last 168 hours.   DDimer No results for input(s): DDIMER in the last 168 hours.   Radiology    No results found.  Cardiac Studies     Patient Profile     66 y.o. male with paroxysmal atrial flutter, post AVR  Assessment &  Plan    Continue amiodarone.  Sx have resolved.  Hopefully, amio can be stopped in a few months.  Atrial flutter should resolve over time.  If not, should be seen by EP.   For questions or updates, please contact [place] Please consult [email or web address] for contact info under Cardiology/STEMI.      Signed, Larae Grooms, MD  06/14/2017, 11:52 AM

## 2017-06-14 NOTE — Telephone Encounter (Signed)
Patient insurances is active and benefits verified. Patient insurances are Medicare A/B & [employer] supplement.  Medicare A/B - no co-payment, deductible $183.00/$183.00 has been met, 20% co-insurance, no out of pocket and no pre-authorization. Passport/reference [phone]. [employer]- no co-payment, deductible $2240/$1699.21 has been met, no out of pocket and no pre-authorization. Passport/reference [phone].  Patient will be contacted and scheduled after their follow up appointment with the cardiologist on 07/03/17 and surgeon on 07/19/17, upon review by [facility] RN navigator.

## 2017-06-15 ENCOUNTER — Telehealth: Payer: Self-pay

## 2017-06-15 DIAGNOSIS — I10 Essential (primary) hypertension: Secondary | ICD-10-CM

## 2017-06-15 MED ORDER — METOPROLOL SUCCINATE ER 25 MG PO TB24: 25.0000 mg | ORAL_TABLET | Freq: Two times a day (BID) | ORAL | 3 refills | Status: DC

## 2017-06-15 MED FILL — Sodium Bicarbonate IV Soln 8.4%: INTRAVENOUS | Qty: 50 | Status: AC

## 2017-06-15 MED FILL — Mannitol IV Soln 20%: INTRAVENOUS | Qty: 500 | Status: AC

## 2017-06-15 MED FILL — Heparin Sodium (Porcine) Inj 1000 Unit/ML: INTRAMUSCULAR | Qty: 30 | Status: AC

## 2017-06-15 MED FILL — Lidocaine HCl IV Inj 20 MG/ML: INTRAVENOUS | Qty: 5 | Status: AC

## 2017-06-15 MED FILL — Sodium Chloride IV Soln 0.9%: INTRAVENOUS | Qty: 2000 | Status: AC

## 2017-06-15 MED FILL — Electrolyte-R (PH 7.4) Solution: INTRAVENOUS | Qty: 4000 | Status: AC

## 2017-06-15 NOTE — Telephone Encounter (Signed)
RX for Metoprolol succinate 25 mg po BID sent to pharm.

## 2017-06-20 ENCOUNTER — Ambulatory Visit (INDEPENDENT_AMBULATORY_CARE_PROVIDER_SITE_OTHER): Payer: Self-pay | Admitting: *Deleted

## 2017-06-20 DIAGNOSIS — Z4802 Encounter for removal of sutures: Secondary | ICD-10-CM

## 2017-06-20 DIAGNOSIS — Z4889 Encounter for other specified surgical aftercare: Secondary | ICD-10-CM

## 2017-06-20 DIAGNOSIS — Z952 Presence of prosthetic heart valve: Secondary | ICD-10-CM

## 2017-06-20 NOTE — Progress Notes (Signed)
Patient seen today for suture removal. 2 sutures removed from chest.  Patient tolerated well.  Complains of being tired and weak.  Advised patient this was normal for 1 week post op.  Patient doing well otherwise.

## 2017-07-03 ENCOUNTER — Encounter: Payer: Self-pay | Admitting: Physician Assistant

## 2017-07-03 ENCOUNTER — Ambulatory Visit (INDEPENDENT_AMBULATORY_CARE_PROVIDER_SITE_OTHER): Payer: Medicare Other | Admitting: Physician Assistant

## 2017-07-03 VITALS — BP 138/81 | HR 59 | Ht 66.0 in | Wt 171.6 lb

## 2017-07-03 DIAGNOSIS — Z952 Presence of prosthetic heart valve: Secondary | ICD-10-CM | POA: Diagnosis not present

## 2017-07-03 DIAGNOSIS — I10 Essential (primary) hypertension: Secondary | ICD-10-CM

## 2017-07-03 DIAGNOSIS — I480 Paroxysmal atrial fibrillation: Secondary | ICD-10-CM

## 2017-07-03 DIAGNOSIS — I2584 Coronary atherosclerosis due to calcified coronary lesion: Secondary | ICD-10-CM

## 2017-07-03 DIAGNOSIS — I2510 Atherosclerotic heart disease of native coronary artery without angina pectoris: Secondary | ICD-10-CM

## 2017-07-03 MED ORDER — AMIODARONE HCL 200 MG PO TABS: 200.0000 mg | ORAL_TABLET | Freq: Two times a day (BID) | ORAL | Status: DC

## 2017-07-03 NOTE — Progress Notes (Signed)
Cardiology Office Note   Date:  07/03/2017   ID:  [name] Brown, DOB 01/06/1951, MRN [id number]  PCP:  [name], MD  Cardiologist:  Dr. Oval Linsey, 02/03/2017  Rosaria Ferries, PA-C    History of Present Illness: [name] Brown is a 66 y.o. male with a history of mild-mod CAD at cath 10/2016 rx medically, S-D-CHF w/ EF 30-35% by echo 09/2016, Severe AS, colon CA s/p colectomy, LBBB, HTN, HLD   Admitted 09/27-10/11/2016 for AVR - 25 mm [facility] Ease Pericardial Tissue Valve. Tol well but had PVCs after surgery, then later PAF w/ RVR>>IV Amio>>SR w/ occ SVT/Atach. Dc on amio 400 bid, Lopressor 25 mg bid. CHADS2VASC= 4 (age x 1, CHF, HTN, CAD)> no anticoagulation for now, will have to reconsider if he has additional episodes. Dc on the amio and consider stopping it in a month or so if pt does well.   [name] Brown presents for cardiology follow up.   He does not think he has had any more atrial fib. He has not had any palpitations. Has not been dizzy or light-headed. He was aware of the afib in the hospital, once they woke him up. However, the atrial fibrillation itself did not wake him.  If he goes up the stairs in the house, he does pretty well, just a little SOB. Has not yet started cardiac rehab. Has not heard from them. Has not been walking, but can start.  His mother-in-law fell and broke her pelvis, he has been helping in her care.   No LE edema, no orthopnea or PND.    Past Medical History:  Diagnosis Date  . Aortic stenosis   . Colon cancer ([place])   . Family history of adverse reaction to anesthesia    pt states his mother had 2 episodes of hyponatremia following anesthesia   . GERD (gastroesophageal reflux disease)   . Headache   . Heart murmur   . Hyperlipidemia 10/25/2016  . Hypertension    pt is currently not taking any medications; pt states is borderline  . Melanoma ([place])   . PAF (paroxysmal atrial fibrillation) ([place]) 06/13/2017  . Perforated sigmoid  colon ([place])   . PVC's (premature ventricular contractions) 10/25/2016  . Tinnitus   . Wears glasses     Past Surgical History:  Procedure Laterality Date  . AORTIC VALVE REPLACEMENT N/A 06/08/2017   Procedure: AORTIC VALVE REPLACEMENT (AVR);  Surgeon: [name], MD;  Location: [place];  Service: Open Heart Surgery;  Laterality: N/A;  Using 13mm Perimount Magna Ease Pericardial Bioprosthesis Aortic Valve  . COLOSTOMY CLOSURE N/A 08/18/2016   Procedure: COLOSTOMY CLOSURE OPEN PROCEDURE;  Surgeon: Armandina Gemma, MD;  Location: [facility];  Service: General;  Laterality: N/A;  . LAPAROTOMY N/A 04/25/2016   Procedure: EXPLORATORY LAPAROTOMY WITH SIGMOID COLECTOMY, COLOSTOMY;  Surgeon: Armandina Gemma, MD;  Location: [facility];  Service: General;  Laterality: N/A;  . LEFT HEART CATH AND CORONARY ANGIOGRAPHY N/A 11/03/2016   Procedure: Left Heart Cath and Coronary Angiography;  Surgeon: Burnell Blanks, MD;  Location: [place] [facility];  Service: Cardiovascular;  Laterality: N/A;  . MELANOMA EXCISION    . PARTIAL COLECTOMY Right 08/18/2016   Procedure: RIGHT COLECTOMY;  Surgeon: Armandina Gemma, MD;  Location: [facility];  Service: General;  Laterality: Right;  . TEE WITHOUT CARDIOVERSION N/A 06/08/2017   Procedure: TRANSESOPHAGEAL ECHOCARDIOGRAM (TEE);  Surgeon: [name], MD;  Location: [place];  Service: Open Heart Surgery;  Laterality: N/A;  Current Outpatient Prescriptions  Medication Sig Dispense Refill  . acetaminophen (TYLENOL) 325 MG tablet Take 2 tablets (650 mg total) by mouth every 6 (six) hours as needed for mild pain.    [name] amiodarone (PACERONE) 400 MG tablet Take 1 tablet (400 mg total) by mouth 2 (two) times daily. 60 tablet 1  . aspirin EC 325 MG EC tablet Take 1 tablet (325 mg total) by mouth daily. 30 tablet 0  . ezetimibe (ZETIA) 10 MG tablet Take 10 mg by mouth daily.    . famotidine (PEPCID) 20 MG tablet Take 20 mg by mouth daily as needed for heartburn or indigestion.    [name]  ibuprofen (ADVIL,MOTRIN) 600 MG tablet Take 600 mg by mouth every 6 (six) hours as needed.    [name] losartan (COZAAR) 25 MG tablet Take 1 tablet (25 mg total) by mouth daily. 90 tablet 1  . MELATONIN PO Take 1 tablet by mouth at bedtime as needed (sleep).    . metoprolol succinate (TOPROL XL) 25 MG 24 hr tablet Take 1 tablet (25 mg total) by mouth 2 (two) times daily. 60 tablet 3  . oxyCODONE (OXY IR/ROXICODONE) 5 MG immediate release tablet Take 5 mg by mouth every 4-6 hours PRN severe pain. 30 tablet 0   No current facility-administered medications for this visit.     Allergies:   Patient has no known allergies.    Social History:  The patient  reports that he has never smoked. He has never used smokeless tobacco. He reports that he drinks alcohol. He reports that he does not use drugs.   Family History:  The patient's family history includes Alcohol abuse in his father; CAD in his mother; Cirrhosis in his father; Dementia in his mother; Heart attack in his father; Kidney disease in his mother; Stroke in his maternal grandmother and paternal grandmother; Vascular Disease in his mother.    ROS:  Please see the history of present illness. All other systems are reviewed and negative.    PHYSICAL EXAM: VS:  BP 138/81   Pulse (!) 59   Ht 5\' 6"  (1.676 m)   Wt 171 lb 9.6 oz (77.8 kg)   SpO2 100%   BMI 27.70 kg/m  , BMI Body mass index is 27.7 kg/m. GEN: Well nourished, well developed, male in no acute distress  HEENT: normal for age  Neck: no JVD, no carotid bruit, no masses Cardiac: RRR; 2/6 murmur, no rubs, or gallops Respiratory:  clear to auscultation bilaterally, normal work of breathing GI: soft, nontender, nondistended, + BS MS: no deformity or atrophy; no edema; distal pulses are 2+ in all 4 extremities   Skin: warm and dry, no rash; incision is healing well without signs of infection. Neuro:  Strength and sensation are intact Psych: euthymic mood, full affect   EKG:  EKG is  not ordered today.  Relevant CV Studies:  OP NOTE: 06/08/2017 1. Median Sternotomy 2. Extracorporeal circulation 3. Aortic valve replacement using a 25 mm Edwards Magna-Ease pericardialvalve.  TEE: 06/08/2017  Mitral valve: No leaflet thickening and calcification present.  Left ventricle: Normal cavity size, wall thickness and left ventricular diastolic function. LV systolic function is mildly to moderately reduced with an EF of 40-45%. There are no obvious wall motion abnormalities. No thrombus present. No mass present.  Right ventricle: Normal cavity size, wall thickness and ejection fraction. No thrombus present. No mass present.  Aortic valve: Moderate valve calcification present. Moderate stenosis. Mild regurgitation.  Recent Labs: 03/23/2017:  TSH 4.40 06/06/2017: ALT 42 06/09/2017: Magnesium 2.3 06/13/2017: BUN 15; Creatinine, Ser 1.01; Hemoglobin 9.6; Platelets 232; Potassium 3.8; Sodium 134    Lipid Panel    Component Value Date/Time   CHOL 177 05/11/2017 1137   TRIG 141 05/11/2017 1137   HDL 59 05/11/2017 1137   CHOLHDL 3.0 05/11/2017 1137   CHOLHDL 3 09/23/2016 1435   VLDL 24.2 09/23/2016 1435   LDLCALC 90 05/11/2017 1137     Wt Readings from Last 3 Encounters:  07/03/17 171 lb 9.6 oz (77.8 kg)  06/14/17 166 lb 12.8 oz (75.7 kg)  06/06/17 169 lb 4.8 oz (76.8 kg)     Other studies Reviewed: Additional studies/ records that were reviewed today include: Office notes, hospital records and testing.  ASSESSMENT AND PLAN:  1.  S/p bioprosthetic AVR: He is doing well postop with no signs or symptoms of volume overload. His murmur is not loud. He is encouraged to increase his activity as tolerated, but don't try to do too much too soon. He cannot drive until cleared by the surgeons. We will refer him to cardiac rehabilitation.  2. Hypertension: His blood pressure is a little above target. However, he notes that his blood pressure in general is well controlled. At  home, his systolic blood pressures generally in the 120s. He thinks he is a little bit anxious about being here today. He is to continue to track his blood pressure at home and let us know if it runs higher than target of 130/80.  3. PAF: He has not had any palpitations at all since discharge. He's had no episodes of being lightheaded or dizzy. As far as he knows, he has not had any atrial fibrillation. He has been taking amiodarone 400 mg twice a day since 10/03. I will cut the dose back to 200 mg daily.   Dr. Oval Linsey to address if he is to continue the amiodarone beyond 3 months or stop it and monitor for symptoms.   Current medicines are reviewed at length with the patient today.  The patient has concerns regarding medicines. Concerns were addressed.  The following changes have been made:    Labs/ tests ordered today include:  No orders of the defined types were placed in this encounter.   Disposition:   FU with Dr. Oval Linsey  Signed, Barrett, Suanne Marker, PA-C  07/03/2017 10:12 AM    [place] Phone: [phone]; Fax: [phone]  This note was written with the assistance of speech recognition software. Please excuse any transcriptional errors.

## 2017-07-03 NOTE — Patient Instructions (Addendum)
Medication Instructions:  DECREASE AMIODARONE 200MG  DAILY If you need a refill on your cardiac medications before your next appointment, please call your pharmacy.  Testing/Procedures: Your physician has requested that you have an echocardiogram. Echocardiography is a painless test that uses sound waves to create images of your heart. It provides your doctor with information about the size and shape of your heart and how well your heart's chambers and valves are working. This procedure takes approximately one hour. There are no restrictions for this procedure.  Special Instructions: PLEASE SCHEDULE ECHO BEFORE FOLLOW UP APPT IN 3 MONTHS  Follow-Up: Your physician wants you to follow-up in: 3 MONTHS WITH DR [facility] AFTER ECHO.   Thank you for choosing [facility] at [facility]!!

## 2017-07-04 ENCOUNTER — Telehealth (HOSPITAL_COMMUNITY): Payer: Self-pay

## 2017-07-04 ENCOUNTER — Encounter (HOSPITAL_COMMUNITY): Payer: Self-pay

## 2017-07-04 NOTE — Telephone Encounter (Signed)
Patient returned phone in regards to Cardiac Rehab. He is interested in program. Scheduled orientation on 07/25/2017 at 8:45am. He will be attending the 11:15am exercise classes beginning on 07/31/2017.

## 2017-07-04 NOTE — Telephone Encounter (Signed)
I called and left message on voicemail to call office about scheduling for cardiac rehab. I left office contact information on patient voicemail to return call.  ° °

## 2017-07-06 ENCOUNTER — Encounter: Payer: Self-pay | Admitting: Physician Assistant

## 2017-07-12 ENCOUNTER — Other Ambulatory Visit (HOSPITAL_COMMUNITY): Payer: Medicare Other

## 2017-07-19 ENCOUNTER — Ambulatory Visit
Admission: RE | Admit: 2017-07-19 | Discharge: 2017-07-19 | Disposition: A | Discharge status: Home / Self Care | Payer: Medicare Other | Source: Ambulatory Visit | Attending: Surgery | Admitting: Surgery

## 2017-07-19 ENCOUNTER — Encounter: Payer: Self-pay | Admitting: Surgery

## 2017-07-19 ENCOUNTER — Other Ambulatory Visit: Payer: Self-pay | Admitting: Surgery

## 2017-07-19 ENCOUNTER — Other Ambulatory Visit: Payer: Self-pay

## 2017-07-19 ENCOUNTER — Ambulatory Visit (INDEPENDENT_AMBULATORY_CARE_PROVIDER_SITE_OTHER): Payer: Self-pay | Admitting: Surgery

## 2017-07-19 VITALS — BP 128/80 | HR 63 | Ht 66.0 in | Wt 165.0 lb

## 2017-07-19 DIAGNOSIS — Z952 Presence of prosthetic heart valve: Secondary | ICD-10-CM

## 2017-07-19 DIAGNOSIS — J90 Pleural effusion, not elsewhere classified: Secondary | ICD-10-CM | POA: Diagnosis not present

## 2017-07-21 ENCOUNTER — Telehealth (HOSPITAL_COMMUNITY): Payer: Self-pay | Admitting: Pharmacy Technician

## 2017-07-21 NOTE — Telephone Encounter (Signed)
Cardiac Rehab Medication Review by a Pharmacist  Does the patient  feel that his/her medications are working for him/her?  yes  Has the patient been experiencing any side effects to the medications prescribed?  no  Does the patient measure his/her own blood pressure or blood glucose at home?  yes   Does the patient have any problems obtaining medications due to transportation or finances?   no  Understanding of regimen: excellent Understanding of indications: excellent Potential of compliance: excellent    Pharmacist comments: Mr. Andonian has an excellent understanding of his current regimen and states he has no issues or changes with his medications at this time.  He checks his BP at home on an irregular basis and states his BP is always at goal.    Carnella Guadalajara 07/21/2017 5:31 PM

## 2017-07-23 ENCOUNTER — Encounter: Payer: Self-pay | Admitting: Surgery

## 2017-07-23 NOTE — Progress Notes (Signed)
    HPI: Patient returns for routine postoperative follow-up having undergone AVR using a 25 mm Edwards Magna-Ease pericardial valve on 06/08/2017. The patient's early postoperative recovery while in the hospital was notable for development of postop atrial fibrillation converted with amiodarone. Since hospital discharge the patient reports that he has been feeling well. He is walking daily without chest pain or shortness of breath and his stamina is improving.   Current Outpatient Medications  Medication Sig Dispense Refill  . amiodarone (PACERONE) 200 MG tablet Take 200 mg daily by mouth.    [name] aspirin EC 325 MG EC tablet Take 1 tablet (325 mg total) by mouth daily. 30 tablet 0  . ezetimibe (ZETIA) 10 MG tablet Take 10 mg by mouth daily.    . famotidine (PEPCID) 20 MG tablet Take 20 mg by mouth daily as needed for heartburn or indigestion.    [name] losartan (COZAAR) 25 MG tablet Take 1 tablet (25 mg total) by mouth daily. 90 tablet 1  . MELATONIN PO Take 5 mg at bedtime as needed by mouth (sleep).     . metoprolol succinate (TOPROL XL) 25 MG 24 hr tablet Take 1 tablet (25 mg total) by mouth 2 (two) times daily. 60 tablet 3   No current facility-administered medications for this visit.     Physical Exam: BP 128/80   Pulse 63   Ht 5\' 6"  (1.676 m)   Wt 165 lb (74.8 kg)   SpO2 98%   BMI 26.63 kg/m  He looks well. Lung exam is clear. Cardiac exam shows a regular rate and rhythm with normal heart sounds. Chest incision is healing well and sternum is stable. There is no peripheral edema.   Diagnostic Tests:  CLINICAL DATA:  Aortic valve replacement 06/08/2017  EXAM: CHEST  2 VIEW  COMPARISON:  06/11/2017  FINDINGS: There is a small right pleural effusion. There is no left pleural effusion. There is no focal consolidation. There is no pneumothorax. The heart and mediastinal contours are unremarkable. There is evidence of prior median sternotomy and aortic valve  replacement.  The osseous structures are unremarkable.  IMPRESSION: Small right pleural effusion.   Electronically Signed   By: Kathreen Devoid   On: 07/19/2017 14:07   Impression:  Overall I think he is doing well. I encouraged him to continue walking. He is planning to participate in cardiac rehab. I told him he could drive his car but should not lift anything heavier than 10 lbs for three months postop.   Plan:  He will continue to follow up with his PCP and cardiology and will contact me if he develops any problems with his incisions.   [name], MD [facility] [phone]

## 2017-07-25 ENCOUNTER — Encounter: Payer: Self-pay | Admitting: Cardiovascular Disease

## 2017-07-25 ENCOUNTER — Encounter (HOSPITAL_COMMUNITY): Payer: Self-pay

## 2017-07-25 ENCOUNTER — Encounter (HOSPITAL_COMMUNITY)
Admission: RE | Admit: 2017-07-25 | Discharge: 2017-07-25 | Disposition: A | Discharge status: Home / Self Care | Payer: Medicare Other | Source: Ambulatory Visit | Attending: Cardiovascular Disease | Admitting: Cardiovascular Disease

## 2017-07-25 VITALS — BP 128/80 | HR 62 | Ht 66.25 in | Wt 171.5 lb

## 2017-07-25 DIAGNOSIS — I480 Paroxysmal atrial fibrillation: Secondary | ICD-10-CM | POA: Insufficient documentation

## 2017-07-25 DIAGNOSIS — I10 Essential (primary) hypertension: Secondary | ICD-10-CM | POA: Diagnosis not present

## 2017-07-25 DIAGNOSIS — Z952 Presence of prosthetic heart valve: Secondary | ICD-10-CM | POA: Diagnosis not present

## 2017-07-25 DIAGNOSIS — E785 Hyperlipidemia, unspecified: Secondary | ICD-10-CM | POA: Diagnosis not present

## 2017-07-25 DIAGNOSIS — K219 Gastro-esophageal reflux disease without esophagitis: Secondary | ICD-10-CM | POA: Diagnosis not present

## 2017-07-25 DIAGNOSIS — Z79899 Other long term (current) drug therapy: Secondary | ICD-10-CM | POA: Insufficient documentation

## 2017-07-25 DIAGNOSIS — Z7982 Long term (current) use of aspirin: Secondary | ICD-10-CM | POA: Diagnosis not present

## 2017-07-25 DIAGNOSIS — I493 Ventricular premature depolarization: Secondary | ICD-10-CM | POA: Insufficient documentation

## 2017-07-25 NOTE — Progress Notes (Signed)
Cardiac Individual Treatment Plan  Patient Details  Name: [name] Brown MRN: [id number] Date of Birth: [birth year] Referring Provider:     Ko Olina from 07/25/2017 in [place]  Referring Provider  Skeet Latch MD      Initial Encounter Date:    CARDIAC REHAB PHASE II ORIENTATION from 07/25/2017 in [place]  Date  07/25/17  Referring Provider  Skeet Latch MD      Visit Diagnosis: S/P AVR  Patient's Home Medications on Admission:  Current Outpatient Medications:  .  amiodarone (PACERONE) 200 MG tablet, Take 200 mg daily by mouth., Disp: , Rfl:  .  aspirin EC 325 MG EC tablet, Take 1 tablet (325 mg total) by mouth daily., Disp: 30 tablet, Rfl: 0 .  ezetimibe (ZETIA) 10 MG tablet, Take 10 mg by mouth daily., Disp: , Rfl:  .  famotidine (PEPCID) 20 MG tablet, Take 20 mg by mouth daily as needed for heartburn or indigestion., Disp: , Rfl:  .  losartan (COZAAR) 25 MG tablet, Take 1 tablet (25 mg total) by mouth daily., Disp: 90 tablet, Rfl: 1 .  MELATONIN PO, Take 5 mg at bedtime as needed by mouth (sleep). , Disp: , Rfl:  .  metoprolol succinate (TOPROL XL) 25 MG 24 hr tablet, Take 1 tablet (25 mg total) by mouth 2 (two) times daily., Disp: 60 tablet, Rfl: 3  Past Medical History: Past Medical History:  Diagnosis Date  . Aortic stenosis   . Colon cancer ([place])   . Family history of adverse reaction to anesthesia    pt states his mother had 2 episodes of hyponatremia following anesthesia   . GERD (gastroesophageal reflux disease)   . Headache   . Heart murmur   . Hyperlipidemia 10/25/2016  . Hypertension    pt is currently not taking any medications; pt states is borderline  . Melanoma ([place])   . PAF (paroxysmal atrial fibrillation) ([place]) 06/13/2017  . Perforated sigmoid colon ([place])   . PVC's (premature ventricular contractions) 10/25/2016  . Tinnitus   . Wears glasses      Tobacco Use: Social History   Tobacco Use  Smoking Status Never Smoker  Smokeless Tobacco Never Used    Labs: Recent Review Flowsheet Data    Labs for ITP Cardiac and Pulmonary Rehab Latest Ref Rng & Units 06/08/2017 06/08/2017 06/08/2017 06/08/2017 06/09/2017   Cholestrol 100 - 199 mg/dL - - - - -   LDLCALC 0 - 99 mg/dL - - - - -   HDL >39 mg/dL - - - - -   Trlycerides 0 - 149 mg/dL - - - - -   Hemoglobin A1c 4.8 - 5.6 % - - - - -   PHART 7.350 - 7.450 7.372 7.449 7.404 - -   PCO2ART 32.0 - 48.0 mmHg 43.8 33.2 38.6 - -   HCO3 20.0 - 28.0 mmol/L 25.8 23.2 24.4 - -   TCO2 22 - 32 mmol/L 27 24 26 23 25    ACIDBASEDEF 0.0 - 2.0 mmol/L - 1.0 1.0 - -   O2SAT % 99.0 99.0 99.0 - -      Capillary Blood Glucose: Lab Results  Component Value Date   GLUCAP 154 (H) 06/10/2017   GLUCAP 121 (H) 06/10/2017   GLUCAP 135 (H) 06/10/2017   GLUCAP 133 (H) 06/09/2017   GLUCAP 150 (H) 06/09/2017     Exercise Target Goals: Date: 07/25/17  Exercise Program Goal: Individual  exercise prescription set with THRR, safety & activity barriers. Participant demonstrates ability to understand and report RPE using BORG scale, to self-measure pulse accurately, and to acknowledge the importance of the exercise prescription.  Exercise Prescription Goal: Starting with aerobic activity 30 plus minutes a day, 3 days per week for initial exercise prescription. Provide home exercise prescription and guidelines that participant acknowledges understanding prior to discharge.  Activity Barriers & Risk Stratification: Activity Barriers & Cardiac Risk Stratification - 07/25/17 1212      Activity Barriers & Cardiac Risk Stratification   Activity Barriers  None       6 Minute Walk: 6 Minute Walk    Row Name 07/25/17 1208         6 Minute Walk   Phase  Initial     Distance  1728 feet     Walk Time  6 minutes     # of Rest Breaks  0     MPH  3.3     METS  3.78     RPE  11     VO2 Peak  13.23      Symptoms  No     Resting HR  62 bpm     Resting BP  128/80     Resting Oxygen Saturation   97 %     Exercise Oxygen Saturation  during 6 min walk  98 %     Max Ex. HR  95 bpm     Max Ex. BP  128/70     2 Minute Post BP  114/72        Oxygen Initial Assessment:   Oxygen Re-Evaluation:   Oxygen Discharge (Final Oxygen Re-Evaluation):   Initial Exercise Prescription: Initial Exercise Prescription - 07/25/17 1200      Date of Initial Exercise RX and Referring Provider   Date  07/25/17    Referring Provider  Skeet Latch MD      Treadmill   MPH  3    Grade  0    Minutes  10    METs  3.3      Bike   Level  0.8    Minutes  10    METs  2.9      NuStep   Level  3    SPM  80    Minutes  10    METs  2.5      Prescription Details   Frequency (times per week)  3    Duration  Progress to 30 minutes of continuous aerobic without signs/symptoms of physical distress      Intensity   THRR 40-80% of Max Heartrate  66-123    Ratings of Perceived Exertion  11-13    Perceived Dyspnea  0-4      Progression   Progression  Continue to progress workloads to maintain intensity without signs/symptoms of physical distress.      Resistance Training   Training Prescription  Yes    Weight  3lbs    Reps  10-15       Perform Capillary Blood Glucose checks as needed.  Exercise Prescription Changes:   Exercise Comments:   Exercise Goals and Review: Exercise Goals    Row Name 07/25/17 1215             Exercise Goals   Increase Physical Activity  Yes       Intervention  Provide advice, education, support and counseling about physical activity/exercise needs.;Develop an individualized exercise prescription for  aerobic and resistive training based on initial evaluation findings, risk stratification, comorbidities and participant's personal goals.       Expected Outcomes  Achievement of increased cardiorespiratory fitness and enhanced flexibility, muscular endurance and  strength shown through measurements of functional capacity and personal statement of participant.       Increase Strength and Stamina  Yes increase UE strength       Intervention  Provide advice, education, support and counseling about physical activity/exercise needs.;Develop an individualized exercise prescription for aerobic and resistive training based on initial evaluation findings, risk stratification, comorbidities and participant's personal goals.       Expected Outcomes  Achievement of increased cardiorespiratory fitness and enhanced flexibility, muscular endurance and strength shown through measurements of functional capacity and personal statement of participant.       Able to understand and use rate of perceived exertion (RPE) scale  Yes       Intervention  Provide education and explanation on how to use RPE scale       Expected Outcomes  Short Term: Able to use RPE daily in rehab to express subjective intensity level;Long Term:  Able to use RPE to guide intensity level when exercising independently       Knowledge and understanding of Target Heart Rate Range (THRR)  Yes       Intervention  Provide education and explanation of THRR including how the numbers were predicted and where they are located for reference       Expected Outcomes  Short Term: Able to state/look up THRR;Long Term: Able to use THRR to govern intensity when exercising independently;Short Term: Able to use daily as guideline for intensity in rehab       Able to check pulse independently  Yes       Intervention  Provide education and demonstration on how to check pulse in carotid and radial arteries.;Review the importance of being able to check your own pulse for safety during independent exercise       Expected Outcomes  Short Term: Able to explain why pulse checking is important during independent exercise;Long Term: Able to check pulse independently and accurately       Understanding of Exercise Prescription  Yes        Intervention  Provide education, explanation, and written materials on patient's individual exercise prescription       Expected Outcomes  Short Term: Able to explain program exercise prescription;Long Term: Able to explain home exercise prescription to exercise independently          Exercise Goals Re-Evaluation :    Discharge Exercise Prescription (Final Exercise Prescription Changes):   Nutrition:  Target Goals: Understanding of nutrition guidelines, daily intake of sodium 1500mg , cholesterol 200mg , calories 30% from fat and 7% or less from saturated fats, daily to have 5 or more servings of fruits and vegetables.  Biometrics: Pre Biometrics - 07/25/17 1227      Pre Biometrics   Height  5\' 6"  (1.676 m)    Weight  171 lb 8.3 oz (77.8 kg)    Waist Circumference  37 inches    Hip Circumference  39.5 inches    Waist to Hip Ratio  0.94 %    BMI (Calculated)  27.7    Triceps Skinfold  22 mm    % Body Fat  27.8 %    Grip Strength  49 kg    Flexibility  12 in    Single Leg Stand  30 seconds  Nutrition Therapy Plan and Nutrition Goals: Nutrition Therapy & Goals - 07/25/17 1058      Nutrition Therapy   Diet  General, healthful      Personal Nutrition Goals   Nutrition Goal  Pt to identify food quantities necessary to achieve weight loss of 6-12 lb at graduation from cardiac rehab. Goal wt of 153 lb desired.       Intervention Plan   Intervention  Prescribe, educate and counsel regarding individualized specific dietary modifications aiming towards targeted core components such as weight, hypertension, lipid management, diabetes, heart failure and other comorbidities.    Expected Outcomes  Short Term Goal: Understand basic principles of dietary content, such as calories, fat, sodium, cholesterol and nutrients.;Long Term Goal: Adherence to prescribed nutrition plan.       Nutrition Discharge: Nutrition Scores: Nutrition Assessments - 07/25/17 1059      MEDFICTS  Scores   Pre Score  40       Nutrition Goals Re-Evaluation:   Nutrition Goals Re-Evaluation:   Nutrition Goals Discharge (Final Nutrition Goals Re-Evaluation):   Psychosocial: Target Goals: Acknowledge presence or absence of significant depression and/or stress, maximize coping skills, provide positive support system. Participant is able to verbalize types and ability to use techniques and skills needed for reducing stress and depression.  Initial Review & Psychosocial Screening: Initial Psych Review & Screening - 07/25/17 0937      Initial Review   Current issues with  None Identified      Family Dynamics   Good Support System?  Yes wife,sister, brother, extended family   wife,sister, brother, extended family   Comments  upon brief assessment, no psychosocial needs identified, no interventions necessary       Barriers   Psychosocial barriers to participate in program  There are no identifiable barriers or psychosocial needs.      Screening Interventions   Interventions  Encouraged to exercise;Provide feedback about the scores to participant       Quality of Life Scores: Quality of Life - 07/25/17 0936      Quality of Life Scores   Health/Function Pre  24.97 %    Socioeconomic Pre  26.06 %    Psych/Spiritual Pre  21.79 %    Family Pre  24.6 %    GLOBAL Pre  24.53 %       PHQ-9: Recent Review Flowsheet Data    Depression screen [facility] 2/9 03/23/2017 03/23/2017 02/07/2017 09/23/2016   Decreased Interest 0 0 0 0   Down, Depressed, Hopeless 0 0 0 0   PHQ - 2 Score 0 0 0 0   Altered sleeping - 0 0 0   Tired, decreased energy - 0 0 0   Change in appetite - 0 0 0   Feeling bad or failure about yourself  - 0 0 0   Trouble concentrating - 0 0 0   Moving slowly or fidgety/restless - 0 0 0   Suicidal thoughts - 0 0 0   PHQ-9 Score - 0 0 0     Interpretation of Total Score  Total Score Depression Severity:  1-4 = Minimal depression, 5-9 = Mild depression, 10-14 =  Moderate depression, 15-19 = Moderately severe depression, 20-27 = Severe depression   Psychosocial Evaluation and Intervention:   Psychosocial Re-Evaluation:   Psychosocial Discharge (Final Psychosocial Re-Evaluation):   Vocational Rehabilitation: Provide vocational rehab assistance to qualifying candidates.   Vocational Rehab Evaluation & Intervention: Vocational Rehab - 07/25/17 3536  Initial Vocational Rehab Evaluation & Intervention   Assessment shows need for Vocational Rehabilitation  No retired    retired       Education: Education Goals: Education classes will be provided on a weekly basis, covering required topics. Participant will state understanding/return demonstration of topics presented.  Learning Barriers/Preferences: Learning Barriers/Preferences - 07/25/17 1208      Learning Barriers/Preferences   Learning Barriers  Sight;Hearing glasses   glasses   Learning Preferences  Written Material       Education Topics: Count Your Pulse:  -Group instruction provided by verbal instruction, demonstration, patient participation and written materials to support subject.  Instructors address importance of being able to find your pulse and how to count your pulse when at home without a heart monitor.  Patients get hands on experience counting their pulse with staff help and individually.   Heart Attack, Angina, and Risk Factor Modification:  -Group instruction provided by verbal instruction, video, and written materials to support subject.  Instructors address signs and symptoms of angina and heart attacks.    Also discuss risk factors for heart disease and how to make changes to improve heart health risk factors.   Functional Fitness:  -Group instruction provided by verbal instruction, demonstration, patient participation, and written materials to support subject.  Instructors address safety measures for doing things around the house.  Discuss how to get up and  down off the floor, how to pick things up properly, how to safely get out of a chair without assistance, and balance training.   Meditation and Mindfulness:  -Group instruction provided by verbal instruction, patient participation, and written materials to support subject.  Instructor addresses importance of mindfulness and meditation practice to help reduce stress and improve awareness.  Instructor also leads participants through a meditation exercise.    Stretching for Flexibility and Mobility:  -Group instruction provided by verbal instruction, patient participation, and written materials to support subject.  Instructors lead participants through series of stretches that are designed to increase flexibility thus improving mobility.  These stretches are additional exercise for major muscle groups that are typically performed during regular warm up and cool down.   Hands Only CPR:  -Group verbal, video, and participation provides a basic overview of AHA guidelines for community CPR. Role-play of emergencies allow participants the opportunity to practice calling for help and chest compression technique with discussion of AED use.   Hypertension: -Group verbal and written instruction that provides a basic overview of hypertension including the most recent diagnostic guidelines, risk factor reduction with self-care instructions and medication management.    Nutrition I class: Heart Healthy Eating:  -Group instruction provided by PowerPoint slides, verbal discussion, and written materials to support subject matter. The instructor gives an explanation and review of the Therapeutic Lifestyle Changes diet recommendations, which includes a discussion on lipid goals, dietary fat, sodium, fiber, plant stanol/sterol esters, sugar, and the components of a well-balanced, healthy diet.   Nutrition II class: Lifestyle Skills:  -Group instruction provided by PowerPoint slides, verbal discussion, and  written materials to support subject matter. The instructor gives an explanation and review of label reading, grocery shopping for heart health, heart healthy recipe modifications, and ways to make healthier choices when eating out.   Diabetes Question & Answer:  -Group instruction provided by PowerPoint slides, verbal discussion, and written materials to support subject matter. The instructor gives an explanation and review of diabetes co-morbidities, pre- and post-prandial blood glucose goals, pre-exercise blood glucose goals, signs, symptoms,  and treatment of hypoglycemia and hyperglycemia, and foot care basics.   Diabetes Blitz:  -Group instruction provided by PowerPoint slides, verbal discussion, and written materials to support subject matter. The instructor gives an explanation and review of the physiology behind type 1 and type 2 diabetes, diabetes medications and rational behind using different medications, pre- and post-prandial blood glucose recommendations and Hemoglobin A1c goals, diabetes diet, and exercise including blood glucose guidelines for exercising safely.    Portion Distortion:  -Group instruction provided by PowerPoint slides, verbal discussion, written materials, and food models to support subject matter. The instructor gives an explanation of serving size versus portion size, changes in portions sizes over the last 20 years, and what consists of a serving from each food group.   Stress Management:  -Group instruction provided by verbal instruction, video, and written materials to support subject matter.  Instructors review role of stress in heart disease and how to cope with stress positively.     Exercising on Your Own:  -Group instruction provided by verbal instruction, power point, and written materials to support subject.  Instructors discuss benefits of exercise, components of exercise, frequency and intensity of exercise, and end points for exercise.  Also discuss  use of nitroglycerin and activating EMS.  Review options of places to exercise outside of rehab.  Review guidelines for sex with heart disease.   Cardiac Drugs I:  -Group instruction provided by verbal instruction and written materials to support subject.  Instructor reviews cardiac drug classes: antiplatelets, anticoagulants, beta blockers, and statins.  Instructor discusses reasons, side effects, and lifestyle considerations for each drug class.   Cardiac Drugs II:  -Group instruction provided by verbal instruction and written materials to support subject.  Instructor reviews cardiac drug classes: angiotensin converting enzyme inhibitors (ACE-I), angiotensin II receptor blockers (ARBs), nitrates, and calcium channel blockers.  Instructor discusses reasons, side effects, and lifestyle considerations for each drug class.   Anatomy and Physiology of the Circulatory System:  Group verbal and written instruction and models provide basic cardiac anatomy and physiology, with the coronary electrical and arterial systems. Review of: AMI, Angina, Valve disease, Heart Failure, Peripheral Artery Disease, Cardiac Arrhythmia, Pacemakers, and the ICD.   Other Education:  -Group or individual verbal, written, or video instructions that support the educational goals of the cardiac rehab program.   Knowledge Questionnaire Score: Knowledge Questionnaire Score - 07/25/17 0938      Knowledge Questionnaire Score   Pre Score  23/24       Core Components/Risk Factors/Patient Goals at Admission: Personal Goals and Risk Factors at Admission - 07/25/17 1216      Core Components/Risk Factors/Patient Goals on Admission    Weight Management  Yes;Weight Loss    Intervention  Weight Management: Develop a combined nutrition and exercise program designed to reach desired caloric intake, while maintaining appropriate intake of nutrient and fiber, sodium and fats, and appropriate energy expenditure required for the  weight goal.;Weight Management: Provide education and appropriate resources to help participant work on and attain dietary goals.;Weight Management/Obesity: Establish reasonable short term and long term weight goals.    Admit Weight  171 lb 8.3 oz (77.8 kg)    Goal Weight: Short Term  165 lb (74.8 kg)    Goal Weight: Long Term  159 lb (72.1 kg)    Expected Outcomes  Short Term: Continue to assess and modify interventions until short term weight is achieved;Long Term: Adherence to nutrition and physical activity/exercise program aimed toward attainment of established  weight goal;Weight Maintenance: Understanding of the daily nutrition guidelines, which includes 25-35% calories from fat, 7% or less cal from saturated fats, less than 200mg  cholesterol, less than 1.5gm of sodium, & 5 or more servings of fruits and vegetables daily;Weight Loss: Understanding of general recommendations for a balanced deficit meal plan, which promotes 1-2 lb weight loss per week and includes a negative energy balance of [phone] kcal/d;Understanding recommendations for meals to include 15-35% energy as protein, 25-35% energy from fat, 35-60% energy from carbohydrates, less than 200mg  of dietary cholesterol, 20-35 gm of total fiber daily;Understanding of distribution of calorie intake throughout the day with the consumption of 4-5 meals/snacks    Hypertension  Yes    Intervention  Monitor prescription use compliance.;Provide education on lifestyle modifcations including regular physical activity/exercise, weight management, moderate sodium restriction and increased consumption of fresh fruit, vegetables, and low fat dairy, alcohol moderation, and smoking cessation.    Expected Outcomes  Long Term: Maintenance of blood pressure at goal levels.;Short Term: Continued assessment and intervention until BP is < 140/11mm HG in hypertensive participants. < 130/39mm HG in hypertensive participants with diabetes, heart failure or chronic  kidney disease.    Lipids  Yes    Intervention  Provide education and support for participant on nutrition & aerobic/resistive exercise along with prescribed medications to achieve LDL 70mg , HDL >40mg .    Expected Outcomes  Short Term: Participant states understanding of desired cholesterol values and is compliant with medications prescribed. Participant is following exercise prescription and nutrition guidelines.;Long Term: Cholesterol controlled with medications as prescribed, with individualized exercise RX and with personalized nutrition plan. Value goals: LDL < 70mg , HDL > 40 mg.       Core Components/Risk Factors/Patient Goals Review:    Core Components/Risk Factors/Patient Goals at Discharge (Final Review):    ITP Comments: ITP Comments    Row Name 07/25/17 0912           ITP Comments  Dr. Fransico Him, Medical Director           Comments: Patient attended orientation from [phone] to 1012  to review rules and guidelines for program. Completed 6 minute walk test, Intitial ITP, and exercise prescription.  VSS. Telemetry-sinus rhythm.  Asymptomatic.Andi Hence, RN, BSN Cardiac Pulmonary Rehab

## 2017-07-25 NOTE — Progress Notes (Signed)
[name] Brown 66 y.o. male DOB: 08-15-1951 MRN: [id number]      Nutrition Note  1. S/P AVR    Past Medical History:  Diagnosis Date  . Aortic stenosis   . Colon cancer ([place])   . Family history of adverse reaction to anesthesia    pt states his mother had 2 episodes of hyponatremia following anesthesia   . GERD (gastroesophageal reflux disease)   . Headache   . Heart murmur   . Hyperlipidemia 10/25/2016  . Hypertension    pt is currently not taking any medications; pt states is borderline  . Melanoma ([place])   . PAF (paroxysmal atrial fibrillation) ([place]) 06/13/2017  . Perforated sigmoid colon ([place])   . PVC's (premature ventricular contractions) 10/25/2016  . Tinnitus   . Wears glasses    Meds reviewed.   HT: Ht Readings from Last 1 Encounters:  07/19/17 5\' 6"  (1.676 m)    WT: Wt Readings from Last 3 Encounters:  07/19/17 165 lb (74.8 kg)  07/03/17 171 lb 9.6 oz (77.8 kg)  06/14/17 166 lb 12.8 oz (75.7 kg)     BMI 26.6   Current tobacco use? No  Labs:  Lipid Panel     Component Value Date/Time   CHOL 177 05/11/2017 1137   TRIG 141 05/11/2017 1137   HDL 59 05/11/2017 1137   CHOLHDL 3.0 05/11/2017 1137   CHOLHDL 3 09/23/2016 1435   VLDL 24.2 09/23/2016 1435   LDLCALC 90 05/11/2017 1137    Lab Results  Component Value Date   HGBA1C 5.1 06/06/2017   CBG (last 3)  No results for input(s): GLUCAP in the last 72 hours.  Nutrition Note Spoke with pt. Nutrition plan and goals reviewed with pt. Pt is a lacto-ovo-pescatarian following Step 2 of the Therapeutic Lifestyle Changes diet. Pt wants to lose wt. Pt has not been actively trying to lose wt "yet." Wt loss tips reviewed.  Pt expressed understanding of the information reviewed. Pt aware of nutrition education classes offered and does not plan on attending nutrition classes and declined nutrition class information "because I think I follow a healthy diet already." Pt states his coronary and carotid arteries were "clear"  of atherosclerotic plaques.   Nutrition Diagnosis ? Food-and nutrition-related knowledge deficit related to lack of exposure to information as related to diagnosis of: ? AVR ? Overweight related to excessive energy intake as evidenced by a BMI of 26.6  Nutrition Intervention ? Pt's individual nutrition plan and goals reviewed with pt.  Nutrition Goal(s):  ? Pt to identify food quantities necessary to achieve weight loss of 6-12 lb at graduation from cardiac rehab. Goal wt of 153 lb desired.   Plan:  Pt to attend nutrition classes ? Portion Distortion  Will provide client-centered nutrition education as part of interdisciplinary care.   Monitor and evaluate progress toward nutrition goal with team.  Derek Mound, M.Ed, RD, LDN, CDE 07/25/2017 10:52 AM

## 2017-07-31 ENCOUNTER — Encounter (HOSPITAL_COMMUNITY): Payer: Medicare Other

## 2017-07-31 ENCOUNTER — Encounter (HOSPITAL_COMMUNITY)
Admission: RE | Admit: 2017-07-31 | Discharge: 2017-07-31 | Disposition: A | Discharge status: Home / Self Care | Payer: Medicare Other | Source: Ambulatory Visit | Attending: Cardiovascular Disease | Admitting: Cardiovascular Disease

## 2017-07-31 DIAGNOSIS — Z952 Presence of prosthetic heart valve: Secondary | ICD-10-CM | POA: Diagnosis not present

## 2017-07-31 DIAGNOSIS — Z7982 Long term (current) use of aspirin: Secondary | ICD-10-CM | POA: Diagnosis not present

## 2017-07-31 DIAGNOSIS — I10 Essential (primary) hypertension: Secondary | ICD-10-CM | POA: Diagnosis not present

## 2017-07-31 DIAGNOSIS — Z79899 Other long term (current) drug therapy: Secondary | ICD-10-CM | POA: Diagnosis not present

## 2017-07-31 DIAGNOSIS — K219 Gastro-esophageal reflux disease without esophagitis: Secondary | ICD-10-CM | POA: Diagnosis not present

## 2017-07-31 DIAGNOSIS — E785 Hyperlipidemia, unspecified: Secondary | ICD-10-CM | POA: Diagnosis not present

## 2017-07-31 NOTE — Progress Notes (Signed)
Daily Session Note  Patient Details  Name: [name] Brown MRN: [id number] Date of Birth: 1951-08-15 Referring Provider:     CARDIAC REHAB PHASE II ORIENTATION from 07/25/2017 in [place]  Referring Provider  Skeet Latch MD      Encounter Date: 07/31/2017  Check In: Session Check In - 07/31/17 1029      Check-In   Location  [facility]    Staff Present  Luetta Nutting Fair, MS, ACSM RCEP, Exercise Physiologist;Portia Bradford, RN, [employer], RN, [employer], RN, Deland Pretty, MS, ACSM CEP, Exercise Physiologist    Supervising physician immediately available to respond to emergencies  [facility] immediately available    Physician(s)  Dr. Maylene Roes     Medication changes reported      No    Fall or balance concerns reported     No    Tobacco Cessation  No Change    Warm-up and Cool-down  Performed as group-led instruction    Resistance Training Performed  Yes    VAD Patient?  No      Pain Assessment   Currently in Pain?  No/denies       Capillary Blood Glucose: No results found for this or any previous visit (from the past 24 hour(s)).  Exercise Prescription Changes - 07/31/17 0952      Response to Exercise   Blood Pressure (Admit)  128/82    Blood Pressure (Exercise)  178/96    Blood Pressure (Exit)  128/86    Heart Rate (Admit)  78 bpm    Heart Rate (Exercise)  109 bpm    Heart Rate (Exit)  78 bpm    Perceived Dyspnea (Exercise)  13    Symptoms  none    Duration  Continue with 30 min of aerobic exercise without signs/symptoms of physical distress.    Intensity  THRR unchanged      Progression   Progression  Continue to progress workloads to maintain intensity without signs/symptoms of physical distress.    Average METs  3.1      Resistance Training   Training Prescription  Yes    Weight  3lbs    Reps  10-15    Time  10 Minutes      Interval Training   Interval Training  No      Treadmill   MPH  3    Grade  0    Minutes  10    METs  3.3      Bike   Level  0.8    Minutes  10    METs  2.98      NuStep   Level  3    SPM  80    Minutes  10    METs  2.9      Home Exercise Plan   Plans to continue exercise at  Home (comment)    Frequency  Add 3 additional days to program exercise sessions.       Social History   Tobacco Use  Smoking Status Never Smoker  Smokeless Tobacco Never Used    Goals Met:  Exercise tolerated well  Goals Unmet:  Not Applicable  Comments: Pt started cardiac rehab today.  Pt tolerated light exercise without difficulty. VSS, telemetry-S[facility] Rhtyhm, asymptomatic.  Medication list reconciled. Pt denies barriers to medicaiton compliance.  PSYCHOSOCIAL ASSESSMENT:  PHQ-0. Pt exhibits positive coping skills, hopeful outlook with supportive family. No psychosocial needs identified at this time, no psychosocial  interventions necessary.    Pt enjoys working in his work room, hiking and fishing.   Pt oriented to exercise equipment and routine.    Understanding verbalized.[name], RN,BSN 07/31/2017 3:45 PM   Dr. Fransico Him is Medical Director for Cardiac Rehab at [facility].

## 2017-08-02 ENCOUNTER — Encounter (HOSPITAL_COMMUNITY): Payer: Medicare Other

## 2017-08-02 ENCOUNTER — Encounter (HOSPITAL_COMMUNITY)
Admission: RE | Admit: 2017-08-02 | Discharge: 2017-08-02 | Disposition: A | Discharge status: Home / Self Care | Payer: Medicare Other | Source: Ambulatory Visit | Attending: Cardiovascular Disease | Admitting: Cardiovascular Disease

## 2017-08-02 DIAGNOSIS — Z952 Presence of prosthetic heart valve: Secondary | ICD-10-CM

## 2017-08-02 DIAGNOSIS — Z79899 Other long term (current) drug therapy: Secondary | ICD-10-CM | POA: Diagnosis not present

## 2017-08-02 DIAGNOSIS — K219 Gastro-esophageal reflux disease without esophagitis: Secondary | ICD-10-CM | POA: Diagnosis not present

## 2017-08-02 DIAGNOSIS — E785 Hyperlipidemia, unspecified: Secondary | ICD-10-CM | POA: Diagnosis not present

## 2017-08-02 DIAGNOSIS — I10 Essential (primary) hypertension: Secondary | ICD-10-CM | POA: Diagnosis not present

## 2017-08-02 DIAGNOSIS — Z7982 Long term (current) use of aspirin: Secondary | ICD-10-CM | POA: Diagnosis not present

## 2017-08-04 ENCOUNTER — Encounter (HOSPITAL_COMMUNITY): Payer: Medicare Other

## 2017-08-07 ENCOUNTER — Encounter (HOSPITAL_COMMUNITY)
Admission: RE | Admit: 2017-08-07 | Discharge: 2017-08-07 | Disposition: A | Discharge status: Home / Self Care | Payer: Medicare Other | Source: Ambulatory Visit | Attending: Cardiovascular Disease | Admitting: Cardiovascular Disease

## 2017-08-07 ENCOUNTER — Encounter (HOSPITAL_COMMUNITY): Payer: Medicare Other

## 2017-08-07 DIAGNOSIS — Z952 Presence of prosthetic heart valve: Secondary | ICD-10-CM | POA: Diagnosis not present

## 2017-08-07 DIAGNOSIS — E785 Hyperlipidemia, unspecified: Secondary | ICD-10-CM | POA: Diagnosis not present

## 2017-08-07 DIAGNOSIS — I10 Essential (primary) hypertension: Secondary | ICD-10-CM | POA: Diagnosis not present

## 2017-08-07 DIAGNOSIS — Z79899 Other long term (current) drug therapy: Secondary | ICD-10-CM | POA: Diagnosis not present

## 2017-08-07 DIAGNOSIS — Z7982 Long term (current) use of aspirin: Secondary | ICD-10-CM | POA: Diagnosis not present

## 2017-08-07 DIAGNOSIS — K219 Gastro-esophageal reflux disease without esophagitis: Secondary | ICD-10-CM | POA: Diagnosis not present

## 2017-08-08 ENCOUNTER — Encounter: Payer: Self-pay | Admitting: Cardiovascular Disease

## 2017-08-09 ENCOUNTER — Encounter (HOSPITAL_COMMUNITY)
Admission: RE | Admit: 2017-08-09 | Discharge: 2017-08-09 | Disposition: A | Discharge status: Home / Self Care | Payer: Medicare Other | Source: Ambulatory Visit | Attending: Cardiovascular Disease | Admitting: Cardiovascular Disease

## 2017-08-09 ENCOUNTER — Encounter (HOSPITAL_COMMUNITY): Payer: Medicare Other

## 2017-08-09 DIAGNOSIS — Z952 Presence of prosthetic heart valve: Secondary | ICD-10-CM

## 2017-08-09 DIAGNOSIS — I10 Essential (primary) hypertension: Secondary | ICD-10-CM | POA: Diagnosis not present

## 2017-08-09 DIAGNOSIS — K219 Gastro-esophageal reflux disease without esophagitis: Secondary | ICD-10-CM | POA: Diagnosis not present

## 2017-08-09 DIAGNOSIS — E785 Hyperlipidemia, unspecified: Secondary | ICD-10-CM | POA: Diagnosis not present

## 2017-08-09 DIAGNOSIS — Z7982 Long term (current) use of aspirin: Secondary | ICD-10-CM | POA: Diagnosis not present

## 2017-08-09 DIAGNOSIS — Z79899 Other long term (current) drug therapy: Secondary | ICD-10-CM | POA: Diagnosis not present

## 2017-08-09 NOTE — Progress Notes (Signed)
Reviewed home exercise guidelines with patient including endpoints, temperature precautions, target heart rate and rate of perceived exertion. Pt is walking as his mode of home exercise. Pt voices understanding of instructions given. Bee Hammerschmidt M Deylan Canterbury, MS, ACSM CEP  

## 2017-08-11 ENCOUNTER — Encounter (HOSPITAL_COMMUNITY): Payer: Medicare Other

## 2017-08-11 ENCOUNTER — Telehealth: Payer: Self-pay | Admitting: *Deleted

## 2017-08-11 ENCOUNTER — Encounter (HOSPITAL_COMMUNITY)
Admission: RE | Admit: 2017-08-11 | Discharge: 2017-08-11 | Disposition: A | Discharge status: Home / Self Care | Payer: Medicare Other | Source: Ambulatory Visit | Attending: Cardiovascular Disease | Admitting: Cardiovascular Disease

## 2017-08-11 DIAGNOSIS — Z7982 Long term (current) use of aspirin: Secondary | ICD-10-CM | POA: Diagnosis not present

## 2017-08-11 DIAGNOSIS — Z79899 Other long term (current) drug therapy: Secondary | ICD-10-CM | POA: Diagnosis not present

## 2017-08-11 DIAGNOSIS — K219 Gastro-esophageal reflux disease without esophagitis: Secondary | ICD-10-CM | POA: Diagnosis not present

## 2017-08-11 DIAGNOSIS — Z952 Presence of prosthetic heart valve: Secondary | ICD-10-CM | POA: Diagnosis not present

## 2017-08-11 DIAGNOSIS — I10 Essential (primary) hypertension: Secondary | ICD-10-CM | POA: Diagnosis not present

## 2017-08-11 DIAGNOSIS — E785 Hyperlipidemia, unspecified: Secondary | ICD-10-CM | POA: Diagnosis not present

## 2017-08-11 NOTE — Progress Notes (Signed)
Cardiac Individual Treatment Plan  Patient Details  Name: [name] Brown MRN: [id number] Date of Birth: April 14, 1951 Referring Provider:     [facility] from 07/25/2017 in [place]  Referring Provider  Skeet Latch MD      Initial Encounter Date:    CARDIAC REHAB PHASE II ORIENTATION from 07/25/2017 in [facility]  Date  07/25/17  Referring Provider  Skeet Latch MD      Visit Diagnosis: S/P AVR 06/08/17  Patient's Home Medications on Admission:  Current Outpatient Medications:  .  amiodarone (PACERONE) 200 MG tablet, Take 200 mg daily by mouth., Disp: , Rfl:  .  aspirin EC 325 MG EC tablet, Take 1 tablet (325 mg total) by mouth daily., Disp: 30 tablet, Rfl: 0 .  ezetimibe (ZETIA) 10 MG tablet, Take 10 mg by mouth daily., Disp: , Rfl:  .  famotidine (PEPCID) 20 MG tablet, Take 20 mg by mouth daily as needed for heartburn or indigestion., Disp: , Rfl:  .  losartan (COZAAR) 25 MG tablet, Take 1 tablet (25 mg total) by mouth daily., Disp: 90 tablet, Rfl: 1 .  MELATONIN PO, Take 5 mg at bedtime as needed by mouth (sleep). , Disp: , Rfl:  .  metoprolol succinate (TOPROL XL) 25 MG 24 hr tablet, Take 1 tablet (25 mg total) by mouth 2 (two) times daily., Disp: 60 tablet, Rfl: 3  Past Medical History: Past Medical History:  Diagnosis Date  . Aortic stenosis   . Colon cancer ([place])   . Family history of adverse reaction to anesthesia    pt states his mother had 2 episodes of hyponatremia following anesthesia   . GERD (gastroesophageal reflux disease)   . Headache   . Heart murmur   . Hyperlipidemia 10/25/2016  . Hypertension    pt is currently not taking any medications; pt states is borderline  . Melanoma ([place])   . PAF (paroxysmal atrial fibrillation) ([place]) 06/13/2017  . Perforated sigmoid colon ([place])   . PVC's (premature ventricular contractions) 10/25/2016  . Tinnitus   . Wears glasses      Tobacco Use: Social History   Tobacco Use  Smoking Status Never Smoker  Smokeless Tobacco Never Used    Labs: Recent Review Flowsheet Data    Labs for ITP Cardiac and Pulmonary Rehab Latest Ref Rng & Units 06/08/2017 06/08/2017 06/08/2017 06/08/2017 06/09/2017   Cholestrol 100 - 199 mg/dL - - - - -   LDLCALC 0 - 99 mg/dL - - - - -   HDL >39 mg/dL - - - - -   Trlycerides 0 - 149 mg/dL - - - - -   Hemoglobin A1c 4.8 - 5.6 % - - - - -   PHART 7.350 - 7.450 7.372 7.449 7.404 - -   PCO2ART 32.0 - 48.0 mmHg 43.8 33.2 38.6 - -   HCO3 20.0 - 28.0 mmol/L 25.8 23.2 24.4 - -   TCO2 22 - 32 mmol/L 27 24 26 23 25    ACIDBASEDEF 0.0 - 2.0 mmol/L - 1.0 1.0 - -   O2SAT % 99.0 99.0 99.0 - -      Capillary Blood Glucose: Lab Results  Component Value Date   GLUCAP 154 (H) 06/10/2017   GLUCAP 121 (H) 06/10/2017   GLUCAP 135 (H) 06/10/2017   GLUCAP 133 (H) 06/09/2017   GLUCAP 150 (H) 06/09/2017     Exercise Target Goals:    Exercise Program Goal:  Individual exercise prescription set with THRR, safety & activity barriers. Participant demonstrates ability to understand and report RPE using BORG scale, to self-measure pulse accurately, and to acknowledge the importance of the exercise prescription.  Exercise Prescription Goal: Starting with aerobic activity 30 plus minutes a day, 3 days per week for initial exercise prescription. Provide home exercise prescription and guidelines that participant acknowledges understanding prior to discharge.  Activity Barriers & Risk Stratification: Activity Barriers & Cardiac Risk Stratification - 07/25/17 1515      Activity Barriers & Cardiac Risk Stratification   Cardiac Risk Stratification  High       6 Minute Walk: 6 Minute Walk    Row Name 07/25/17 1208         6 Minute Walk   Phase  Initial     Distance  1728 feet     Walk Time  6 minutes     # of Rest Breaks  0     MPH  3.3     METS  3.78     RPE  11     VO2 Peak  13.23      Symptoms  No     Resting HR  62 bpm     Resting BP  128/80     Resting Oxygen Saturation   97 %     Exercise Oxygen Saturation  during 6 min walk  98 %     Max Ex. HR  95 bpm     Max Ex. BP  128/70     2 Minute Post BP  114/72        Oxygen Initial Assessment:   Oxygen Re-Evaluation:   Oxygen Discharge (Final Oxygen Re-Evaluation):   Initial Exercise Prescription: Initial Exercise Prescription - 07/25/17 1200      Date of Initial Exercise RX and Referring Provider   Date  07/25/17    Referring Provider  Skeet Latch MD      Treadmill   MPH  3    Grade  0    Minutes  10    METs  3.3      Bike   Level  0.8    Minutes  10    METs  2.9      NuStep   Level  3    SPM  80    Minutes  10    METs  2.5      Prescription Details   Frequency (times per week)  3    Duration  Progress to 30 minutes of continuous aerobic without signs/symptoms of physical distress      Intensity   THRR 40-80% of Max Heartrate  66-123    Ratings of Perceived Exertion  11-13    Perceived Dyspnea  0-4      Progression   Progression  Continue to progress workloads to maintain intensity without signs/symptoms of physical distress.      Resistance Training   Training Prescription  Yes    Weight  3lbs    Reps  10-15       Perform Capillary Blood Glucose checks as needed.  Exercise Prescription Changes:  Exercise Prescription Changes    Row Name 07/31/17 0952 08/09/17 1100           Response to Exercise   Blood Pressure (Admit)  128/82  138/82      Blood Pressure (Exercise)  178/96  144/70      Blood Pressure (Exit)  128/86  138/80  Heart Rate (Admit)  78 bpm  76 bpm      Heart Rate (Exercise)  109 bpm  123 bpm      Heart Rate (Exit)  78 bpm  77 bpm      Perceived Dyspnea (Exercise)  13  12      Symptoms  none  none      Duration  Continue with 30 min of aerobic exercise without signs/symptoms of physical distress.  Continue with 30 min of aerobic exercise without  signs/symptoms of physical distress.      Intensity  THRR unchanged  THRR unchanged        Progression   Progression  Continue to progress workloads to maintain intensity without signs/symptoms of physical distress.  Continue to progress workloads to maintain intensity without signs/symptoms of physical distress.      Average METs  3.1  5.1        Resistance Training   Training Prescription  Yes  No Relaxation today      Weight  3lbs  -      Reps  10-15  -      Time  10 Minutes  -        Interval Training   Interval Training  No  No        Treadmill   MPH  3  3.2      Grade  0  3      Minutes  10  4.54      METs  3.3  4.54        Bike   Level  0.8  1.8      Minutes  10  10      METs  2.98  5.39        NuStep   Level  3  4      SPM  80  80      Minutes  10  10      METs  2.9  5.4        Home Exercise Plan   Plans to continue exercise at  Home (comment)  Home (comment)      Frequency  Add 3 additional days to program exercise sessions.  Add 3 additional days to program exercise sessions.      Initial Home Exercises Provided  -  08/09/17         Exercise Comments:  Exercise Comments    Row Name 07/31/17 1412 08/09/17 0953         Exercise Comments  Off to a good start with exercise.  Reviewed home exercise guidelines, METs and goals.         Exercise Goals and Review:  Exercise Goals    Row Name 07/25/17 1215             Exercise Goals   Increase Physical Activity  Yes       Intervention  Provide advice, education, support and counseling about physical activity/exercise needs.;Develop an individualized exercise prescription for aerobic and resistive training based on initial evaluation findings, risk stratification, comorbidities and participant's personal goals.       Expected Outcomes  Achievement of increased cardiorespiratory fitness and enhanced flexibility, muscular endurance and strength shown through measurements of functional capacity and personal  statement of participant.       Increase Strength and Stamina  Yes increase UE strength       Intervention  Provide advice, education, support and counseling about physical activity/exercise needs.;Develop  an individualized exercise prescription for aerobic and resistive training based on initial evaluation findings, risk stratification, comorbidities and participant's personal goals.       Expected Outcomes  Achievement of increased cardiorespiratory fitness and enhanced flexibility, muscular endurance and strength shown through measurements of functional capacity and personal statement of participant.       Able to understand and use rate of perceived exertion (RPE) scale  Yes       Intervention  Provide education and explanation on how to use RPE scale       Expected Outcomes  Short Term: Able to use RPE daily in rehab to express subjective intensity level;Long Term:  Able to use RPE to guide intensity level when exercising independently       Knowledge and understanding of Target Heart Rate Range (THRR)  Yes       Intervention  Provide education and explanation of THRR including how the numbers were predicted and where they are located for reference       Expected Outcomes  Short Term: Able to state/look up THRR;Long Term: Able to use THRR to govern intensity when exercising independently;Short Term: Able to use daily as guideline for intensity in rehab       Able to check pulse independently  Yes       Intervention  Provide education and demonstration on how to check pulse in carotid and radial arteries.;Review the importance of being able to check your own pulse for safety during independent exercise       Expected Outcomes  Short Term: Able to explain why pulse checking is important during independent exercise;Long Term: Able to check pulse independently and accurately       Understanding of Exercise Prescription  Yes       Intervention  Provide education, explanation, and written materials on  patient's individual exercise prescription       Expected Outcomes  Short Term: Able to explain program exercise prescription;Long Term: Able to explain home exercise prescription to exercise independently          Exercise Goals Re-Evaluation : Exercise Goals Re-Evaluation    Row Name 07/31/17 1412 08/09/17 1015           Exercise Goal Re-Evaluation   Exercise Goals Review  Increase Physical Activity  Understanding of Exercise Prescription      Comments  Patient tolerated first day of exercise well, no complaints. Pt states he is walking approximately 3 miles 3-4 days/week in about 60 minute sessions.  Reviewed home exercise guidelines including THRR, RPE scale, and endpoints for exercise. Pt states he is walking approximately 3 miles 3-4 days/week in about 55-60 minute sessions.      Expected Outcomes  Increase workloads as tolerated to help achieve health and fitness goals.  Continue walking at home in addition to exercise at cardiac rehab.          Discharge Exercise Prescription (Final Exercise Prescription Changes): Exercise Prescription Changes - 08/09/17 1100      Response to Exercise   Blood Pressure (Admit)  138/82    Blood Pressure (Exercise)  144/70    Blood Pressure (Exit)  138/80    Heart Rate (Admit)  76 bpm    Heart Rate (Exercise)  123 bpm    Heart Rate (Exit)  77 bpm    Perceived Dyspnea (Exercise)  12    Symptoms  none    Duration  Continue with 30 min of aerobic exercise without signs/symptoms of physical distress.  Intensity  THRR unchanged      Progression   Progression  Continue to progress workloads to maintain intensity without signs/symptoms of physical distress.    Average METs  5.1      Resistance Training   Training Prescription  No Relaxation today      Interval Training   Interval Training  No      Treadmill   MPH  3.2    Grade  3    Minutes  4.54    METs  4.54      Bike   Level  1.8    Minutes  10    METs  5.39      NuStep    Level  4    SPM  80    Minutes  10    METs  5.4      Home Exercise Plan   Plans to continue exercise at  Home (comment)    Frequency  Add 3 additional days to program exercise sessions.    Initial Home Exercises Provided  08/09/17       Nutrition:  Target Goals: Understanding of nutrition guidelines, daily intake of sodium 1500mg , cholesterol 200mg , calories 30% from fat and 7% or less from saturated fats, daily to have 5 or more servings of fruits and vegetables.  Biometrics: Pre Biometrics - 07/25/17 1515      Pre Biometrics   Height  5' 6.25" (1.683 m)    Weight  171 lb 8.3 oz (77.8 kg)    Waist Circumference  37 inches    Hip Circumference  39.5 inches    Waist to Hip Ratio  0.94 %    BMI (Calculated)  27.47    Triceps Skinfold  22 mm    % Body Fat  27.8 %    Grip Strength  49 kg    Flexibility  12 in    Single Leg Stand  30 seconds        Nutrition Therapy Plan and Nutrition Goals: Nutrition Therapy & Goals - 07/25/17 1058      Nutrition Therapy   Diet  General, healthful      Personal Nutrition Goals   Nutrition Goal  Pt to identify food quantities necessary to achieve weight loss of 6-12 lb at graduation from cardiac rehab. Goal wt of 153 lb desired.       Intervention Plan   Intervention  Prescribe, educate and counsel regarding individualized specific dietary modifications aiming towards targeted core components such as weight, hypertension, lipid management, diabetes, heart failure and other comorbidities.    Expected Outcomes  Short Term Goal: Understand basic principles of dietary content, such as calories, fat, sodium, cholesterol and nutrients.;Long Term Goal: Adherence to prescribed nutrition plan.       Nutrition Discharge: Nutrition Scores: Nutrition Assessments - 07/25/17 1059      MEDFICTS Scores   Pre Score  40       Nutrition Goals Re-Evaluation:   Nutrition Goals Re-Evaluation:   Nutrition Goals Discharge (Final Nutrition Goals  Re-Evaluation):   Psychosocial: Target Goals: Acknowledge presence or absence of significant depression and/or stress, maximize coping skills, provide positive support system. Participant is able to verbalize types and ability to use techniques and skills needed for reducing stress and depression.  Initial Review & Psychosocial Screening: Initial Psych Review & Screening - 07/25/17 0937      Initial Review   Current issues with  None Identified      Family Dynamics  Good Support System?  Yes wife,sister, brother, extended family    Comments  upon brief assessment, no psychosocial needs identified, no interventions necessary       Barriers   Psychosocial barriers to participate in program  There are no identifiable barriers or psychosocial needs.      Screening Interventions   Interventions  Encouraged to exercise;Provide feedback about the scores to participant       Quality of Life Scores: Quality of Life - 07/25/17 0936      Quality of Life Scores   Health/Function Pre  24.97 %    Socioeconomic Pre  26.06 %    Psych/Spiritual Pre  21.79 %    Family Pre  24.6 %    GLOBAL Pre  24.53 %       PHQ-9: Recent Review Flowsheet Data    Depression screen [facility] 2/9 07/31/2017 03/23/2017 03/23/2017 02/07/2017 09/23/2016   Decreased Interest 0 0 0 0 0   Down, Depressed, Hopeless 0 0 0 0 0   PHQ - 2 Score 0 0 0 0 0   Altered sleeping - - 0 0 0   Tired, decreased energy - - 0 0 0   Change in appetite - - 0 0 0   Feeling bad or failure about yourself  - - 0 0 0   Trouble concentrating - - 0 0 0   Moving slowly or fidgety/restless - - 0 0 0   Suicidal thoughts - - 0 0 0   PHQ-9 Score - - 0 0 0     Interpretation of Total Score  Total Score Depression Severity:  1-4 = Minimal depression, 5-9 = Mild depression, 10-14 = Moderate depression, 15-19 = Moderately severe depression, 20-27 = Severe depression   Psychosocial Evaluation and Intervention:   Psychosocial  Re-Evaluation: Psychosocial Re-Evaluation    Row Name 08/11/17 1616             Psychosocial Re-Evaluation   Current issues with  None Identified       Interventions  Encouraged to attend Cardiac Rehabilitation for the exercise       Continue Psychosocial Services   No Follow up required          Psychosocial Discharge (Final Psychosocial Re-Evaluation): Psychosocial Re-Evaluation - 08/11/17 1616      Psychosocial Re-Evaluation   Current issues with  None Identified    Interventions  Encouraged to attend Cardiac Rehabilitation for the exercise    Continue Psychosocial Services   No Follow up required       Vocational Rehabilitation: Provide vocational rehab assistance to qualifying candidates.   Vocational Rehab Evaluation & Intervention: Vocational Rehab - 07/25/17 [phone]      Initial Vocational Rehab Evaluation & Intervention   Assessment shows need for Vocational Rehabilitation  No retired        Education: Education Goals: Education classes will be provided on a weekly basis, covering required topics. Participant will state understanding/return demonstration of topics presented.  Learning Barriers/Preferences: Learning Barriers/Preferences - 07/25/17 1208      Learning Barriers/Preferences   Learning Barriers  Sight;Hearing glasses    Learning Preferences  Written Material       Education Topics: Count Your Pulse:  -Group instruction provided by verbal instruction, demonstration, patient participation and written materials to support subject.  Instructors address importance of being able to find your pulse and how to count your pulse when at home without a heart monitor.  Patients get hands on experience counting  their pulse with staff help and individually.   Heart Attack, Angina, and Risk Factor Modification:  -Group instruction provided by verbal instruction, video, and written materials to support subject.  Instructors address signs and symptoms of angina  and heart attacks.    Also discuss risk factors for heart disease and how to make changes to improve heart health risk factors.   Functional Fitness:  -Group instruction provided by verbal instruction, demonstration, patient participation, and written materials to support subject.  Instructors address safety measures for doing things around the house.  Discuss how to get up and down off the floor, how to pick things up properly, how to safely get out of a chair without assistance, and balance training.   Meditation and Mindfulness:  -Group instruction provided by verbal instruction, patient participation, and written materials to support subject.  Instructor addresses importance of mindfulness and meditation practice to help reduce stress and improve awareness.  Instructor also leads participants through a meditation exercise.    Stretching for Flexibility and Mobility:  -Group instruction provided by verbal instruction, patient participation, and written materials to support subject.  Instructors lead participants through series of stretches that are designed to increase flexibility thus improving mobility.  These stretches are additional exercise for major muscle groups that are typically performed during regular warm up and cool down.   Hands Only CPR:  -Group verbal, video, and participation provides a basic overview of AHA guidelines for community CPR. Role-play of emergencies allow participants the opportunity to practice calling for help and chest compression technique with discussion of AED use.   Hypertension: -Group verbal and written instruction that provides a basic overview of hypertension including the most recent diagnostic guidelines, risk factor reduction with self-care instructions and medication management.    Nutrition I class: Heart Healthy Eating:  -Group instruction provided by PowerPoint slides, verbal discussion, and written materials to support subject matter. The  instructor gives an explanation and review of the Therapeutic Lifestyle Changes diet recommendations, which includes a discussion on lipid goals, dietary fat, sodium, fiber, plant stanol/sterol esters, sugar, and the components of a well-balanced, healthy diet.   Nutrition II class: Lifestyle Skills:  -Group instruction provided by PowerPoint slides, verbal discussion, and written materials to support subject matter. The instructor gives an explanation and review of label reading, grocery shopping for heart health, heart healthy recipe modifications, and ways to make healthier choices when eating out.   Diabetes Question & Answer:  -Group instruction provided by PowerPoint slides, verbal discussion, and written materials to support subject matter. The instructor gives an explanation and review of diabetes co-morbidities, pre- and post-prandial blood glucose goals, pre-exercise blood glucose goals, signs, symptoms, and treatment of hypoglycemia and hyperglycemia, and foot care basics.   Diabetes Blitz:  -Group instruction provided by PowerPoint slides, verbal discussion, and written materials to support subject matter. The instructor gives an explanation and review of the physiology behind type 1 and type 2 diabetes, diabetes medications and rational behind using different medications, pre- and post-prandial blood glucose recommendations and Hemoglobin A1c goals, diabetes diet, and exercise including blood glucose guidelines for exercising safely.    Portion Distortion:  -Group instruction provided by PowerPoint slides, verbal discussion, written materials, and food models to support subject matter. The instructor gives an explanation of serving size versus portion size, changes in portions sizes over the last 20 years, and what consists of a serving from each food group.   Stress Management:  -Group instruction provided by verbal instruction,  video, and written materials to support subject  matter.  Instructors review role of stress in heart disease and how to cope with stress positively.     Exercising on Your Own:  -Group instruction provided by verbal instruction, power point, and written materials to support subject.  Instructors discuss benefits of exercise, components of exercise, frequency and intensity of exercise, and end points for exercise.  Also discuss use of nitroglycerin and activating EMS.  Review options of places to exercise outside of rehab.  Review guidelines for sex with heart disease.   Cardiac Drugs I:  -Group instruction provided by verbal instruction and written materials to support subject.  Instructor reviews cardiac drug classes: antiplatelets, anticoagulants, beta blockers, and statins.  Instructor discusses reasons, side effects, and lifestyle considerations for each drug class.   Cardiac Drugs II:  -Group instruction provided by verbal instruction and written materials to support subject.  Instructor reviews cardiac drug classes: angiotensin converting enzyme inhibitors (ACE-I), angiotensin II receptor blockers (ARBs), nitrates, and calcium channel blockers.  Instructor discusses reasons, side effects, and lifestyle considerations for each drug class.   Anatomy and Physiology of the Circulatory System:  Group verbal and written instruction and models provide basic cardiac anatomy and physiology, with the coronary electrical and arterial systems. Review of: AMI, Angina, Valve disease, Heart Failure, Peripheral Artery Disease, Cardiac Arrhythmia, Pacemakers, and the ICD.   Other Education:  -Group or individual verbal, written, or video instructions that support the educational goals of the cardiac rehab program.   Knowledge Questionnaire Score: Knowledge Questionnaire Score - 07/25/17 0938      Knowledge Questionnaire Score   Pre Score  23/24       Core Components/Risk Factors/Patient Goals at Admission: Personal Goals and Risk Factors at  Admission - 07/25/17 1216      Core Components/Risk Factors/Patient Goals on Admission    Weight Management  Yes;Weight Loss    Intervention  Weight Management: Develop a combined nutrition and exercise program designed to reach desired caloric intake, while maintaining appropriate intake of nutrient and fiber, sodium and fats, and appropriate energy expenditure required for the weight goal.;Weight Management: Provide education and appropriate resources to help participant work on and attain dietary goals.;Weight Management/Obesity: Establish reasonable short term and long term weight goals.    Admit Weight  171 lb 8.3 oz (77.8 kg)    Goal Weight: Short Term  165 lb (74.8 kg)    Goal Weight: Long Term  159 lb (72.1 kg)    Expected Outcomes  Short Term: Continue to assess and modify interventions until short term weight is achieved;Long Term: Adherence to nutrition and physical activity/exercise program aimed toward attainment of established weight goal;Weight Maintenance: Understanding of the daily nutrition guidelines, which includes 25-35% calories from fat, 7% or less cal from saturated fats, less than 200mg  cholesterol, less than 1.5gm of sodium, & 5 or more servings of fruits and vegetables daily;Weight Loss: Understanding of general recommendations for a balanced deficit meal plan, which promotes 1-2 lb weight loss per week and includes a negative energy balance of [phone] kcal/d;Understanding recommendations for meals to include 15-35% energy as protein, 25-35% energy from fat, 35-60% energy from carbohydrates, less than 200mg  of dietary cholesterol, 20-35 gm of total fiber daily;Understanding of distribution of calorie intake throughout the day with the consumption of 4-5 meals/snacks    Hypertension  Yes    Intervention  Monitor prescription use compliance.;Provide education on lifestyle modifcations including regular physical activity/exercise, weight management, moderate sodium restriction  and  increased consumption of fresh fruit, vegetables, and low fat dairy, alcohol moderation, and smoking cessation.    Expected Outcomes  Long Term: Maintenance of blood pressure at goal levels.;Short Term: Continued assessment and intervention until BP is < 140/67mm HG in hypertensive participants. < 130/26mm HG in hypertensive participants with diabetes, heart failure or chronic kidney disease.    Lipids  Yes    Intervention  Provide education and support for participant on nutrition & aerobic/resistive exercise along with prescribed medications to achieve LDL 70mg , HDL >40mg .    Expected Outcomes  Short Term: Participant states understanding of desired cholesterol values and is compliant with medications prescribed. Participant is following exercise prescription and nutrition guidelines.;Long Term: Cholesterol controlled with medications as prescribed, with individualized exercise RX and with personalized nutrition plan. Value goals: LDL < 70mg , HDL > 40 mg.       Core Components/Risk Factors/Patient Goals Review:  Goals and Risk Factor Review    Row Name 08/11/17 1612             Core Components/Risk Factors/Patient Goals Review   Personal Goals Review  Weight Management/Obesity;Hypertension;Lipids       Review  Dionisio has had some intermittent exertional BP elevations with exercise. Patient asked to with exercise with workload expectations and not to overdue it       Expected Outcomes  Patient will take medicaiotns as presribed to reduce overall risk factors          Core Components/Risk Factors/Patient Goals at Discharge (Final Review):  Goals and Risk Factor Review - 08/11/17 1612      Core Components/Risk Factors/Patient Goals Review   Personal Goals Review  Weight Management/Obesity;Hypertension;Lipids    Review  Braxtin has had some intermittent exertional BP elevations with exercise. Patient asked to with exercise with workload expectations and not to overdue it    Expected Outcomes   Patient will take medicaiotns as presribed to reduce overall risk factors       ITP Comments: ITP Comments    Row Name 07/25/17 0912 08/08/17 1642         ITP Comments  Dr. Fransico Him, Medical Director   30 day ITP review. Patient with good participation and attendance         Comments: See ITP comments.[name], RN,BSN 08/11/2017 4:20 PM

## 2017-08-11 NOTE — Telephone Encounter (Signed)
Discussed with Dr Oval Linsey and will have patient get 1 more refill and then stop. Advised patient   [place], [place]  to Skeet Latch, MD     08/08/17 12:39 PM  I will run out of amiodarone on 12/3. Should I discontinue it or refill for another month?  My echocardiogram is scheduled for 12/28.  My followup visit with you is scheduled for 1/29.  Recent EKG info may be available from my [facility] classes if it is recorded in Epic.   Thanks!  [email or web address]

## 2017-08-14 ENCOUNTER — Encounter (HOSPITAL_COMMUNITY)
Admission: RE | Admit: 2017-08-14 | Discharge: 2017-08-14 | Disposition: A | Discharge status: Home / Self Care | Payer: Medicare Other | Source: Ambulatory Visit | Attending: Cardiovascular Disease | Admitting: Cardiovascular Disease

## 2017-08-14 ENCOUNTER — Encounter (HOSPITAL_COMMUNITY): Payer: Medicare Other

## 2017-08-14 DIAGNOSIS — I493 Ventricular premature depolarization: Secondary | ICD-10-CM | POA: Diagnosis not present

## 2017-08-14 DIAGNOSIS — Z79899 Other long term (current) drug therapy: Secondary | ICD-10-CM | POA: Diagnosis not present

## 2017-08-14 DIAGNOSIS — K219 Gastro-esophageal reflux disease without esophagitis: Secondary | ICD-10-CM | POA: Insufficient documentation

## 2017-08-14 DIAGNOSIS — Z7982 Long term (current) use of aspirin: Secondary | ICD-10-CM | POA: Diagnosis not present

## 2017-08-14 DIAGNOSIS — Z952 Presence of prosthetic heart valve: Secondary | ICD-10-CM | POA: Insufficient documentation

## 2017-08-14 DIAGNOSIS — E785 Hyperlipidemia, unspecified: Secondary | ICD-10-CM | POA: Diagnosis not present

## 2017-08-14 DIAGNOSIS — I480 Paroxysmal atrial fibrillation: Secondary | ICD-10-CM | POA: Diagnosis not present

## 2017-08-14 DIAGNOSIS — I10 Essential (primary) hypertension: Secondary | ICD-10-CM | POA: Insufficient documentation

## 2017-08-16 ENCOUNTER — Encounter (HOSPITAL_COMMUNITY)
Admission: RE | Admit: 2017-08-16 | Discharge: 2017-08-16 | Disposition: A | Discharge status: Home / Self Care | Payer: Medicare Other | Source: Ambulatory Visit | Attending: Cardiovascular Disease | Admitting: Cardiovascular Disease

## 2017-08-16 ENCOUNTER — Encounter (HOSPITAL_COMMUNITY): Payer: Medicare Other

## 2017-08-16 DIAGNOSIS — Z952 Presence of prosthetic heart valve: Secondary | ICD-10-CM | POA: Diagnosis not present

## 2017-08-16 DIAGNOSIS — Z7982 Long term (current) use of aspirin: Secondary | ICD-10-CM | POA: Diagnosis not present

## 2017-08-16 DIAGNOSIS — E785 Hyperlipidemia, unspecified: Secondary | ICD-10-CM | POA: Diagnosis not present

## 2017-08-16 DIAGNOSIS — Z79899 Other long term (current) drug therapy: Secondary | ICD-10-CM | POA: Diagnosis not present

## 2017-08-16 DIAGNOSIS — K219 Gastro-esophageal reflux disease without esophagitis: Secondary | ICD-10-CM | POA: Diagnosis not present

## 2017-08-16 DIAGNOSIS — I10 Essential (primary) hypertension: Secondary | ICD-10-CM | POA: Diagnosis not present

## 2017-08-18 ENCOUNTER — Encounter (HOSPITAL_COMMUNITY): Payer: Medicare Other

## 2017-08-18 ENCOUNTER — Encounter (HOSPITAL_COMMUNITY)
Admission: RE | Admit: 2017-08-18 | Discharge: 2017-08-18 | Disposition: A | Discharge status: Home / Self Care | Payer: Medicare Other | Source: Ambulatory Visit | Attending: Cardiovascular Disease | Admitting: Cardiovascular Disease

## 2017-08-18 DIAGNOSIS — Z952 Presence of prosthetic heart valve: Secondary | ICD-10-CM | POA: Diagnosis not present

## 2017-08-18 DIAGNOSIS — Z79899 Other long term (current) drug therapy: Secondary | ICD-10-CM | POA: Diagnosis not present

## 2017-08-18 DIAGNOSIS — I10 Essential (primary) hypertension: Secondary | ICD-10-CM | POA: Diagnosis not present

## 2017-08-18 DIAGNOSIS — K219 Gastro-esophageal reflux disease without esophagitis: Secondary | ICD-10-CM | POA: Diagnosis not present

## 2017-08-18 DIAGNOSIS — Z7982 Long term (current) use of aspirin: Secondary | ICD-10-CM | POA: Diagnosis not present

## 2017-08-18 DIAGNOSIS — E785 Hyperlipidemia, unspecified: Secondary | ICD-10-CM | POA: Diagnosis not present

## 2017-08-21 ENCOUNTER — Encounter (HOSPITAL_COMMUNITY): Payer: Medicare Other

## 2017-08-23 ENCOUNTER — Encounter (HOSPITAL_COMMUNITY): Payer: Medicare Other

## 2017-08-23 ENCOUNTER — Encounter (HOSPITAL_COMMUNITY)
Admission: RE | Admit: 2017-08-23 | Discharge: 2017-08-23 | Disposition: A | Discharge status: Home / Self Care | Payer: Medicare Other | Source: Ambulatory Visit | Attending: Cardiovascular Disease | Admitting: Cardiovascular Disease

## 2017-08-23 DIAGNOSIS — K219 Gastro-esophageal reflux disease without esophagitis: Secondary | ICD-10-CM | POA: Diagnosis not present

## 2017-08-23 DIAGNOSIS — I10 Essential (primary) hypertension: Secondary | ICD-10-CM | POA: Diagnosis not present

## 2017-08-23 DIAGNOSIS — E785 Hyperlipidemia, unspecified: Secondary | ICD-10-CM | POA: Diagnosis not present

## 2017-08-23 DIAGNOSIS — Z7982 Long term (current) use of aspirin: Secondary | ICD-10-CM | POA: Diagnosis not present

## 2017-08-23 DIAGNOSIS — Z952 Presence of prosthetic heart valve: Secondary | ICD-10-CM

## 2017-08-23 DIAGNOSIS — Z79899 Other long term (current) drug therapy: Secondary | ICD-10-CM | POA: Diagnosis not present

## 2017-08-25 ENCOUNTER — Encounter (HOSPITAL_COMMUNITY)
Admission: RE | Admit: 2017-08-25 | Discharge: 2017-08-25 | Disposition: A | Discharge status: Home / Self Care | Payer: Medicare Other | Source: Ambulatory Visit | Attending: Cardiovascular Disease | Admitting: Cardiovascular Disease

## 2017-08-25 ENCOUNTER — Encounter (HOSPITAL_COMMUNITY): Payer: Medicare Other

## 2017-08-25 DIAGNOSIS — I10 Essential (primary) hypertension: Secondary | ICD-10-CM | POA: Diagnosis not present

## 2017-08-25 DIAGNOSIS — K219 Gastro-esophageal reflux disease without esophagitis: Secondary | ICD-10-CM | POA: Diagnosis not present

## 2017-08-25 DIAGNOSIS — Z952 Presence of prosthetic heart valve: Secondary | ICD-10-CM | POA: Diagnosis not present

## 2017-08-25 DIAGNOSIS — E785 Hyperlipidemia, unspecified: Secondary | ICD-10-CM | POA: Diagnosis not present

## 2017-08-25 DIAGNOSIS — Z7982 Long term (current) use of aspirin: Secondary | ICD-10-CM | POA: Diagnosis not present

## 2017-08-25 DIAGNOSIS — Z79899 Other long term (current) drug therapy: Secondary | ICD-10-CM | POA: Diagnosis not present

## 2017-08-28 ENCOUNTER — Encounter (HOSPITAL_COMMUNITY): Payer: Medicare Other

## 2017-08-28 ENCOUNTER — Encounter (HOSPITAL_COMMUNITY)
Admission: RE | Admit: 2017-08-28 | Discharge: 2017-08-28 | Disposition: A | Discharge status: Home / Self Care | Payer: Medicare Other | Source: Ambulatory Visit | Attending: Cardiovascular Disease | Admitting: Cardiovascular Disease

## 2017-08-28 DIAGNOSIS — Z79899 Other long term (current) drug therapy: Secondary | ICD-10-CM | POA: Diagnosis not present

## 2017-08-28 DIAGNOSIS — E785 Hyperlipidemia, unspecified: Secondary | ICD-10-CM | POA: Diagnosis not present

## 2017-08-28 DIAGNOSIS — Z7982 Long term (current) use of aspirin: Secondary | ICD-10-CM | POA: Diagnosis not present

## 2017-08-28 DIAGNOSIS — Z952 Presence of prosthetic heart valve: Secondary | ICD-10-CM

## 2017-08-28 DIAGNOSIS — K219 Gastro-esophageal reflux disease without esophagitis: Secondary | ICD-10-CM | POA: Diagnosis not present

## 2017-08-28 DIAGNOSIS — I10 Essential (primary) hypertension: Secondary | ICD-10-CM | POA: Diagnosis not present

## 2017-08-30 ENCOUNTER — Encounter (HOSPITAL_COMMUNITY): Payer: Medicare Other

## 2017-08-30 ENCOUNTER — Encounter (HOSPITAL_COMMUNITY)
Admission: RE | Admit: 2017-08-30 | Discharge: 2017-08-30 | Disposition: A | Discharge status: Home / Self Care | Payer: Medicare Other | Source: Ambulatory Visit | Attending: Cardiovascular Disease | Admitting: Cardiovascular Disease

## 2017-08-30 DIAGNOSIS — Z952 Presence of prosthetic heart valve: Secondary | ICD-10-CM | POA: Diagnosis not present

## 2017-08-30 DIAGNOSIS — E785 Hyperlipidemia, unspecified: Secondary | ICD-10-CM | POA: Diagnosis not present

## 2017-08-30 DIAGNOSIS — I10 Essential (primary) hypertension: Secondary | ICD-10-CM | POA: Diagnosis not present

## 2017-08-30 DIAGNOSIS — K219 Gastro-esophageal reflux disease without esophagitis: Secondary | ICD-10-CM | POA: Diagnosis not present

## 2017-08-30 DIAGNOSIS — Z79899 Other long term (current) drug therapy: Secondary | ICD-10-CM | POA: Diagnosis not present

## 2017-08-30 DIAGNOSIS — Z7982 Long term (current) use of aspirin: Secondary | ICD-10-CM | POA: Diagnosis not present

## 2017-09-01 ENCOUNTER — Encounter (HOSPITAL_COMMUNITY): Payer: Medicare Other

## 2017-09-01 ENCOUNTER — Encounter (HOSPITAL_COMMUNITY)
Admission: RE | Admit: 2017-09-01 | Discharge: 2017-09-01 | Disposition: A | Discharge status: Home / Self Care | Payer: Medicare Other | Source: Ambulatory Visit | Attending: Cardiovascular Disease | Admitting: Cardiovascular Disease

## 2017-09-01 DIAGNOSIS — I10 Essential (primary) hypertension: Secondary | ICD-10-CM | POA: Diagnosis not present

## 2017-09-01 DIAGNOSIS — Z952 Presence of prosthetic heart valve: Secondary | ICD-10-CM

## 2017-09-01 DIAGNOSIS — K219 Gastro-esophageal reflux disease without esophagitis: Secondary | ICD-10-CM | POA: Diagnosis not present

## 2017-09-01 DIAGNOSIS — Z7982 Long term (current) use of aspirin: Secondary | ICD-10-CM | POA: Diagnosis not present

## 2017-09-01 DIAGNOSIS — Z79899 Other long term (current) drug therapy: Secondary | ICD-10-CM | POA: Diagnosis not present

## 2017-09-01 DIAGNOSIS — E785 Hyperlipidemia, unspecified: Secondary | ICD-10-CM | POA: Diagnosis not present

## 2017-09-04 ENCOUNTER — Encounter (HOSPITAL_COMMUNITY): Payer: Medicare Other

## 2017-09-06 ENCOUNTER — Encounter (HOSPITAL_COMMUNITY): Payer: Medicare Other

## 2017-09-07 ENCOUNTER — Encounter (HOSPITAL_COMMUNITY): Payer: Self-pay | Admitting: *Deleted

## 2017-09-07 DIAGNOSIS — Z952 Presence of prosthetic heart valve: Secondary | ICD-10-CM

## 2017-09-07 NOTE — Progress Notes (Signed)
Cardiac Individual Treatment Plan  Patient Details  Name: [name] Brown MRN: [id number] Date of Birth: [birth year] Referring Provider:     [facility] from 07/25/2017 in [place]  Referring Provider  Skeet Latch MD      Initial Encounter Date:    CARDIAC REHAB PHASE II ORIENTATION from 07/25/2017 in [place]  Date  07/25/17  Referring Provider  Skeet Latch MD      Visit Diagnosis: S/P AVR 06/08/17  Patient's Home Medications on Admission:  Current Outpatient Medications:  .  amiodarone (PACERONE) 200 MG tablet, Take 200 mg daily by mouth., Disp: , Rfl:  .  aspirin EC 325 MG EC tablet, Take 1 tablet (325 mg total) by mouth daily., Disp: 30 tablet, Rfl: 0 .  ezetimibe (ZETIA) 10 MG tablet, Take 10 mg by mouth daily., Disp: , Rfl:  .  famotidine (PEPCID) 20 MG tablet, Take 20 mg by mouth daily as needed for heartburn or indigestion., Disp: , Rfl:  .  losartan (COZAAR) 25 MG tablet, Take 1 tablet (25 mg total) by mouth daily., Disp: 90 tablet, Rfl: 1 .  MELATONIN PO, Take 5 mg at bedtime as needed by mouth (sleep). , Disp: , Rfl:  .  metoprolol succinate (TOPROL XL) 25 MG 24 hr tablet, Take 1 tablet (25 mg total) by mouth 2 (two) times daily., Disp: 60 tablet, Rfl: 3  Past Medical History: Past Medical History:  Diagnosis Date  . Aortic stenosis   . Colon cancer ([place])   . Family history of adverse reaction to anesthesia    pt states his mother had 2 episodes of hyponatremia following anesthesia   . GERD (gastroesophageal reflux disease)   . Headache   . Heart murmur   . Hyperlipidemia 10/25/2016  . Hypertension    pt is currently not taking any medications; pt states is borderline  . Melanoma ([place])   . PAF (paroxysmal atrial fibrillation) ([place]) 06/13/2017  . Perforated sigmoid colon ([place])   . PVC's (premature ventricular contractions) 10/25/2016  . Tinnitus   . Wears glasses      Tobacco Use: Social History   Tobacco Use  Smoking Status Never Smoker  Smokeless Tobacco Never Used    Labs: Recent Review Flowsheet Data    Labs for ITP Cardiac and Pulmonary Rehab Latest Ref Rng & Units 06/08/2017 06/08/2017 06/08/2017 06/08/2017 06/09/2017   Cholestrol 100 - 199 mg/dL - - - - -   LDLCALC 0 - 99 mg/dL - - - - -   HDL >39 mg/dL - - - - -   Trlycerides 0 - 149 mg/dL - - - - -   Hemoglobin A1c 4.8 - 5.6 % - - - - -   PHART 7.350 - 7.450 7.372 7.449 7.404 - -   PCO2ART 32.0 - 48.0 mmHg 43.8 33.2 38.6 - -   HCO3 20.0 - 28.0 mmol/L 25.8 23.2 24.4 - -   TCO2 22 - 32 mmol/L 27 24 26 23 25    ACIDBASEDEF 0.0 - 2.0 mmol/L - 1.0 1.0 - -   O2SAT % 99.0 99.0 99.0 - -      Capillary Blood Glucose: Lab Results  Component Value Date   GLUCAP 154 (H) 06/10/2017   GLUCAP 121 (H) 06/10/2017   GLUCAP 135 (H) 06/10/2017   GLUCAP 133 (H) 06/09/2017   GLUCAP 150 (H) 06/09/2017     Exercise Target Goals:    Exercise Program Goal:  Individual exercise prescription set with THRR, safety & activity barriers. Participant demonstrates ability to understand and report RPE using BORG scale, to self-measure pulse accurately, and to acknowledge the importance of the exercise prescription.  Exercise Prescription Goal: Starting with aerobic activity 30 plus minutes a day, 3 days per week for initial exercise prescription. Provide home exercise prescription and guidelines that participant acknowledges understanding prior to discharge.  Activity Barriers & Risk Stratification: Activity Barriers & Cardiac Risk Stratification - 07/25/17 1515      Activity Barriers & Cardiac Risk Stratification   Cardiac Risk Stratification  High       6 Minute Walk: 6 Minute Walk    Row Name 07/25/17 1208         6 Minute Walk   Phase  Initial     Distance  1728 feet     Walk Time  6 minutes     # of Rest Breaks  0     MPH  3.3     METS  3.78     RPE  11     VO2 Peak  13.23      Symptoms  No     Resting HR  62 bpm     Resting BP  128/80     Resting Oxygen Saturation   97 %     Exercise Oxygen Saturation  during 6 min walk  98 %     Max Ex. HR  95 bpm     Max Ex. BP  128/70     2 Minute Post BP  114/72        Oxygen Initial Assessment:   Oxygen Re-Evaluation:   Oxygen Discharge (Final Oxygen Re-Evaluation):   Initial Exercise Prescription: Initial Exercise Prescription - 07/25/17 1200      Date of Initial Exercise RX and Referring Provider   Date  07/25/17    Referring Provider  Skeet Latch MD      Treadmill   MPH  3    Grade  0    Minutes  10    METs  3.3      Bike   Level  0.8    Minutes  10    METs  2.9      NuStep   Level  3    SPM  80    Minutes  10    METs  2.5      Prescription Details   Frequency (times per week)  3    Duration  Progress to 30 minutes of continuous aerobic without signs/symptoms of physical distress      Intensity   THRR 40-80% of Max Heartrate  66-123    Ratings of Perceived Exertion  11-13    Perceived Dyspnea  0-4      Progression   Progression  Continue to progress workloads to maintain intensity without signs/symptoms of physical distress.      Resistance Training   Training Prescription  Yes    Weight  3lbs    Reps  10-15       Perform Capillary Blood Glucose checks as needed.  Exercise Prescription Changes:  Exercise Prescription Changes    Row Name 07/31/17 0952 08/09/17 1100 08/23/17 1000 09/01/17 0950       Response to Exercise   Blood Pressure (Admit)  128/82  138/82  122/72  108/70    Blood Pressure (Exercise)  178/96  144/70  154/80  144/78    Blood Pressure (Exit)  128/86  138/80  108/82  114/70    Heart Rate (Admit)  78 bpm  76 bpm  63 bpm  60 bpm    Heart Rate (Exercise)  109 bpm  123 bpm  115 bpm  118 bpm    Heart Rate (Exit)  78 bpm  77 bpm  71 bpm  56 bpm    Perceived Dyspnea (Exercise)  13  12  14  13     Symptoms  none  none  none  none    Duration  Continue with  30 min of aerobic exercise without signs/symptoms of physical distress.  Continue with 30 min of aerobic exercise without signs/symptoms of physical distress.  Continue with 30 min of aerobic exercise without signs/symptoms of physical distress.  Continue with 30 min of aerobic exercise without signs/symptoms of physical distress.    Intensity  THRR unchanged  THRR unchanged  THRR unchanged  THRR unchanged      Progression   Progression  Continue to progress workloads to maintain intensity without signs/symptoms of physical distress.  Continue to progress workloads to maintain intensity without signs/symptoms of physical distress.  Continue to progress workloads to maintain intensity without signs/symptoms of physical distress.  Continue to progress workloads to maintain intensity without signs/symptoms of physical distress.    Average METs  3.1  5.1  5.2  5.5      Resistance Training   Training Prescription  Yes  No Relaxation today  Yes  Yes    Weight  3lbs  -  5lbs  5lbs    Reps  10-15  -  10-15  10-15    Time  10 Minutes  -  10 Minutes  10 Minutes      Interval Training   Interval Training  No  No  No  No      Treadmill   MPH  3  3.2  3.3  3.3    Grade  0  3  3  5     Minutes  10  4.54  10  10    METs  3.3  4.54  4.89  5.8      Bike   Level  0.8  1.8  1.8  1.8    Minutes  10  10  10  10     METs  2.98  5.39  5.37  5.35      NuStep   Level  3  4  4  4     SPM  80  80  80  80    Minutes  10  10  10  10     METs  2.9  5.4  5.4  5.4      Home Exercise Plan   Plans to continue exercise at  Home (comment)  Home (comment)  Home (comment)  Home (comment)    Frequency  Add 3 additional days to program exercise sessions.  Add 3 additional days to program exercise sessions.  Add 3 additional days to program exercise sessions.  Add 3 additional days to program exercise sessions.    Initial Home Exercises Provided  -  08/09/17  08/09/17  08/09/17       Exercise Comments:  Exercise  Comments    Row Name 07/31/17 1412 08/09/17 0953 08/23/17 1046       Exercise Comments  Off to a good start with exercise.  Reviewed home exercise guidelines, METs and goals.  Reviewed METs and goals with patient.  Exercise Goals and Review:  Exercise Goals    Row Name 07/25/17 1215             Exercise Goals   Increase Physical Activity  Yes       Intervention  Provide advice, education, support and counseling about physical activity/exercise needs.;Develop an individualized exercise prescription for aerobic and resistive training based on initial evaluation findings, risk stratification, comorbidities and participant's personal goals.       Expected Outcomes  Achievement of increased cardiorespiratory fitness and enhanced flexibility, muscular endurance and strength shown through measurements of functional capacity and personal statement of participant.       Increase Strength and Stamina  Yes increase UE strength       Intervention  Provide advice, education, support and counseling about physical activity/exercise needs.;Develop an individualized exercise prescription for aerobic and resistive training based on initial evaluation findings, risk stratification, comorbidities and participant's personal goals.       Expected Outcomes  Achievement of increased cardiorespiratory fitness and enhanced flexibility, muscular endurance and strength shown through measurements of functional capacity and personal statement of participant.       Able to understand and use rate of perceived exertion (RPE) scale  Yes       Intervention  Provide education and explanation on how to use RPE scale       Expected Outcomes  Short Term: Able to use RPE daily in rehab to express subjective intensity level;Long Term:  Able to use RPE to guide intensity level when exercising independently       Knowledge and understanding of Target Heart Rate Range (THRR)  Yes       Intervention  Provide education and  explanation of THRR including how the numbers were predicted and where they are located for reference       Expected Outcomes  Short Term: Able to state/look up THRR;Long Term: Able to use THRR to govern intensity when exercising independently;Short Term: Able to use daily as guideline for intensity in rehab       Able to check pulse independently  Yes       Intervention  Provide education and demonstration on how to check pulse in carotid and radial arteries.;Review the importance of being able to check your own pulse for safety during independent exercise       Expected Outcomes  Short Term: Able to explain why pulse checking is important during independent exercise;Long Term: Able to check pulse independently and accurately       Understanding of Exercise Prescription  Yes       Intervention  Provide education, explanation, and written materials on patient's individual exercise prescription       Expected Outcomes  Short Term: Able to explain program exercise prescription;Long Term: Able to explain home exercise prescription to exercise independently          Exercise Goals Re-Evaluation : Exercise Goals Re-Evaluation    Row Name 07/31/17 1412 08/09/17 1015 08/23/17 1044         Exercise Goal Re-Evaluation   Exercise Goals Review  Increase Physical Activity  Understanding of Exercise Prescription  Increase Physical Activity;Able to understand and use rate of perceived exertion (RPE) scale;Knowledge and understanding of Target Heart Rate Range (THRR)     Comments  Patient tolerated first day of exercise well, no complaints. Pt states he is walking approximately 3 miles 3-4 days/week in about 60 minute sessions.  Reviewed home exercise guidelines including THRR, RPE scale, and  endpoints for exercise. Pt states he is walking approximately 3 miles 3-4 days/week in about 55-60 minute sessions.  Patient is progressing well with exercise and steadily increasing workloads as tolerated. Reviewed THRR  and RPE scale as it relates to increasing intensity of exercise.     Expected Outcomes  Increase workloads as tolerated to help achieve health and fitness goals.  Continue walking at home in addition to exercise at cardiac rehab.  Increase workloads as tolerated to achieve health and fitness benefits.         Discharge Exercise Prescription (Final Exercise Prescription Changes): Exercise Prescription Changes - 09/01/17 0950      Response to Exercise   Blood Pressure (Admit)  108/70    Blood Pressure (Exercise)  144/78    Blood Pressure (Exit)  114/70    Heart Rate (Admit)  60 bpm    Heart Rate (Exercise)  118 bpm    Heart Rate (Exit)  56 bpm    Perceived Dyspnea (Exercise)  13    Symptoms  none    Duration  Continue with 30 min of aerobic exercise without signs/symptoms of physical distress.    Intensity  THRR unchanged      Progression   Progression  Continue to progress workloads to maintain intensity without signs/symptoms of physical distress.    Average METs  5.5      Resistance Training   Training Prescription  Yes    Weight  5lbs    Reps  10-15    Time  10 Minutes      Interval Training   Interval Training  No      Treadmill   MPH  3.3    Grade  5    Minutes  10    METs  5.8      Bike   Level  1.8    Minutes  10    METs  5.35      NuStep   Level  4    SPM  80    Minutes  10    METs  5.4      Home Exercise Plan   Plans to continue exercise at  Home (comment)    Frequency  Add 3 additional days to program exercise sessions.    Initial Home Exercises Provided  08/09/17       Nutrition:  Target Goals: Understanding of nutrition guidelines, daily intake of sodium 1500mg , cholesterol 200mg , calories 30% from fat and 7% or less from saturated fats, daily to have 5 or more servings of fruits and vegetables.  Biometrics: Pre Biometrics - 07/25/17 1515      Pre Biometrics   Height  5' 6.25" (1.683 m)    Weight  171 lb 8.3 oz (77.8 kg)    Waist  Circumference  37 inches    Hip Circumference  39.5 inches    Waist to Hip Ratio  0.94 %    BMI (Calculated)  27.47    Triceps Skinfold  22 mm    % Body Fat  27.8 %    Grip Strength  49 kg    Flexibility  12 in    Single Leg Stand  30 seconds        Nutrition Therapy Plan and Nutrition Goals: Nutrition Therapy & Goals - 07/25/17 1058      Nutrition Therapy   Diet  General, healthful      Personal Nutrition Goals   Nutrition Goal  Pt to identify food quantities  necessary to achieve weight loss of 6-12 lb at graduation from cardiac rehab. Goal wt of 153 lb desired.       Intervention Plan   Intervention  Prescribe, educate and counsel regarding individualized specific dietary modifications aiming towards targeted core components such as weight, hypertension, lipid management, diabetes, heart failure and other comorbidities.    Expected Outcomes  Short Term Goal: Understand basic principles of dietary content, such as calories, fat, sodium, cholesterol and nutrients.;Long Term Goal: Adherence to prescribed nutrition plan.       Nutrition Discharge: Nutrition Scores: Nutrition Assessments - 07/25/17 1059      MEDFICTS Scores   Pre Score  40       Nutrition Goals Re-Evaluation:   Nutrition Goals Re-Evaluation:   Nutrition Goals Discharge (Final Nutrition Goals Re-Evaluation):   Psychosocial: Target Goals: Acknowledge presence or absence of significant depression and/or stress, maximize coping skills, provide positive support system. Participant is able to verbalize types and ability to use techniques and skills needed for reducing stress and depression.  Initial Review & Psychosocial Screening: Initial Psych Review & Screening - 07/25/17 0937      Initial Review   Current issues with  None Identified      Family Dynamics   Good Support System?  Yes wife,sister, brother, extended family    Comments  upon brief assessment, no psychosocial needs identified, no  interventions necessary       Barriers   Psychosocial barriers to participate in program  There are no identifiable barriers or psychosocial needs.      Screening Interventions   Interventions  Encouraged to exercise;Provide feedback about the scores to participant       Quality of Life Scores: Quality of Life - 07/25/17 0936      Quality of Life Scores   Health/Function Pre  24.97 %    Socioeconomic Pre  26.06 %    Psych/Spiritual Pre  21.79 %    Family Pre  24.6 %    GLOBAL Pre  24.53 %       PHQ-9: Recent Review Flowsheet Data    Depression screen [facility] 2/9 07/31/2017 03/23/2017 03/23/2017 02/07/2017 09/23/2016   Decreased Interest 0 0 0 0 0   Down, Depressed, Hopeless 0 0 0 0 0   PHQ - 2 Score 0 0 0 0 0   Altered sleeping - - 0 0 0   Tired, decreased energy - - 0 0 0   Change in appetite - - 0 0 0   Feeling bad or failure about yourself  - - 0 0 0   Trouble concentrating - - 0 0 0   Moving slowly or fidgety/restless - - 0 0 0   Suicidal thoughts - - 0 0 0   PHQ-9 Score - - 0 0 0     Interpretation of Total Score  Total Score Depression Severity:  1-4 = Minimal depression, 5-9 = Mild depression, 10-14 = Moderate depression, 15-19 = Moderately severe depression, 20-27 = Severe depression   Psychosocial Evaluation and Intervention:   Psychosocial Re-Evaluation: Psychosocial Re-Evaluation    Row Name 08/11/17 1616 09/07/17 1151           Psychosocial Re-Evaluation   Current issues with  None Identified  None Identified      Interventions  Encouraged to attend Cardiac Rehabilitation for the exercise  Encouraged to attend Cardiac Rehabilitation for the exercise      Continue Psychosocial Services   No Follow up required  No Follow up required         Psychosocial Discharge (Final Psychosocial Re-Evaluation): Psychosocial Re-Evaluation - 09/07/17 1151      Psychosocial Re-Evaluation   Current issues with  None Identified    Interventions  Encouraged to attend  Cardiac Rehabilitation for the exercise    Continue Psychosocial Services   No Follow up required       Vocational Rehabilitation: Provide vocational rehab assistance to qualifying candidates.   Vocational Rehab Evaluation & Intervention: Vocational Rehab - 07/25/17 [phone]      Initial Vocational Rehab Evaluation & Intervention   Assessment shows need for Vocational Rehabilitation  No retired        Education: Education Goals: Education classes will be provided on a weekly basis, covering required topics. Participant will state understanding/return demonstration of topics presented.  Learning Barriers/Preferences: Learning Barriers/Preferences - 07/25/17 1208      Learning Barriers/Preferences   Learning Barriers  Sight;Hearing glasses    Learning Preferences  Written Material       Education Topics: Count Your Pulse:  -Group instruction provided by verbal instruction, demonstration, patient participation and written materials to support subject.  Instructors address importance of being able to find your pulse and how to count your pulse when at home without a heart monitor.  Patients get hands on experience counting their pulse with staff help and individually.   Heart Attack, Angina, and Risk Factor Modification:  -Group instruction provided by verbal instruction, video, and written materials to support subject.  Instructors address signs and symptoms of angina and heart attacks.    Also discuss risk factors for heart disease and how to make changes to improve heart health risk factors.   Functional Fitness:  -Group instruction provided by verbal instruction, demonstration, patient participation, and written materials to support subject.  Instructors address safety measures for doing things around the house.  Discuss how to get up and down off the floor, how to pick things up properly, how to safely get out of a chair without assistance, and balance training.   Meditation and  Mindfulness:  -Group instruction provided by verbal instruction, patient participation, and written materials to support subject.  Instructor addresses importance of mindfulness and meditation practice to help reduce stress and improve awareness.  Instructor also leads participants through a meditation exercise.    Stretching for Flexibility and Mobility:  -Group instruction provided by verbal instruction, patient participation, and written materials to support subject.  Instructors lead participants through series of stretches that are designed to increase flexibility thus improving mobility.  These stretches are additional exercise for major muscle groups that are typically performed during regular warm up and cool down.   Hands Only CPR:  -Group verbal, video, and participation provides a basic overview of AHA guidelines for community CPR. Role-play of emergencies allow participants the opportunity to practice calling for help and chest compression technique with discussion of AED use.   Hypertension: -Group verbal and written instruction that provides a basic overview of hypertension including the most recent diagnostic guidelines, risk factor reduction with self-care instructions and medication management.    Nutrition I class: Heart Healthy Eating:  -Group instruction provided by PowerPoint slides, verbal discussion, and written materials to support subject matter. The instructor gives an explanation and review of the Therapeutic Lifestyle Changes diet recommendations, which includes a discussion on lipid goals, dietary fat, sodium, fiber, plant stanol/sterol esters, sugar, and the components of a well-balanced, healthy diet.   Nutrition II class: Lifestyle  Skills:  -Group instruction provided by [employer], verbal discussion, and written materials to support subject matter. The instructor gives an explanation and review of label reading, grocery shopping for heart health, heart  healthy recipe modifications, and ways to make healthier choices when eating out.   Diabetes Question & Answer:  -Group instruction provided by PowerPoint slides, verbal discussion, and written materials to support subject matter. The instructor gives an explanation and review of diabetes co-morbidities, pre- and post-prandial blood glucose goals, pre-exercise blood glucose goals, signs, symptoms, and treatment of hypoglycemia and hyperglycemia, and foot care basics.   Diabetes Blitz:  -Group instruction provided by PowerPoint slides, verbal discussion, and written materials to support subject matter. The instructor gives an explanation and review of the physiology behind type 1 and type 2 diabetes, diabetes medications and rational behind using different medications, pre- and post-prandial blood glucose recommendations and Hemoglobin A1c goals, diabetes diet, and exercise including blood glucose guidelines for exercising safely.    Portion Distortion:  -Group instruction provided by PowerPoint slides, verbal discussion, written materials, and food models to support subject matter. The instructor gives an explanation of serving size versus portion size, changes in portions sizes over the last 20 years, and what consists of a serving from each food group.   Stress Management:  -Group instruction provided by verbal instruction, video, and written materials to support subject matter.  Instructors review role of stress in heart disease and how to cope with stress positively.     Exercising on Your Own:  -Group instruction provided by verbal instruction, power point, and written materials to support subject.  Instructors discuss benefits of exercise, components of exercise, frequency and intensity of exercise, and end points for exercise.  Also discuss use of nitroglycerin and activating EMS.  Review options of places to exercise outside of rehab.  Review guidelines for sex with heart  disease.   Cardiac Drugs I:  -Group instruction provided by verbal instruction and written materials to support subject.  Instructor reviews cardiac drug classes: antiplatelets, anticoagulants, beta blockers, and statins.  Instructor discusses reasons, side effects, and lifestyle considerations for each drug class.   Cardiac Drugs II:  -Group instruction provided by verbal instruction and written materials to support subject.  Instructor reviews cardiac drug classes: angiotensin converting enzyme inhibitors (ACE-I), angiotensin II receptor blockers (ARBs), nitrates, and calcium channel blockers.  Instructor discusses reasons, side effects, and lifestyle considerations for each drug class.   Anatomy and Physiology of the Circulatory System:  Group verbal and written instruction and models provide basic cardiac anatomy and physiology, with the coronary electrical and arterial systems. Review of: AMI, Angina, Valve disease, Heart Failure, Peripheral Artery Disease, Cardiac Arrhythmia, Pacemakers, and the ICD.   Other Education:  -Group or individual verbal, written, or video instructions that support the educational goals of the cardiac rehab program.   Knowledge Questionnaire Score: Knowledge Questionnaire Score - 07/25/17 0938      Knowledge Questionnaire Score   Pre Score  23/24       Core Components/Risk Factors/Patient Goals at Admission: Personal Goals and Risk Factors at Admission - 07/25/17 1216      Core Components/Risk Factors/Patient Goals on Admission    Weight Management  Yes;Weight Loss    Intervention  Weight Management: Develop a combined nutrition and exercise program designed to reach desired caloric intake, while maintaining appropriate intake of nutrient and fiber, sodium and fats, and appropriate energy expenditure required for the weight goal.;Weight Management: Provide education and appropriate  resources to help participant work on and attain dietary goals.;Weight  Management/Obesity: Establish reasonable short term and long term weight goals.    Admit Weight  171 lb 8.3 oz (77.8 kg)    Goal Weight: Short Term  165 lb (74.8 kg)    Goal Weight: Long Term  159 lb (72.1 kg)    Expected Outcomes  Short Term: Continue to assess and modify interventions until short term weight is achieved;Long Term: Adherence to nutrition and physical activity/exercise program aimed toward attainment of established weight goal;Weight Maintenance: Understanding of the daily nutrition guidelines, which includes 25-35% calories from fat, 7% or less cal from saturated fats, less than 200mg  cholesterol, less than 1.5gm of sodium, & 5 or more servings of fruits and vegetables daily;Weight Loss: Understanding of general recommendations for a balanced deficit meal plan, which promotes 1-2 lb weight loss per week and includes a negative energy balance of [phone] kcal/d;Understanding recommendations for meals to include 15-35% energy as protein, 25-35% energy from fat, 35-60% energy from carbohydrates, less than 200mg  of dietary cholesterol, 20-35 gm of total fiber daily;Understanding of distribution of calorie intake throughout the day with the consumption of 4-5 meals/snacks    Hypertension  Yes    Intervention  Monitor prescription use compliance.;Provide education on lifestyle modifcations including regular physical activity/exercise, weight management, moderate sodium restriction and increased consumption of fresh fruit, vegetables, and low fat dairy, alcohol moderation, and smoking cessation.    Expected Outcomes  Long Term: Maintenance of blood pressure at goal levels.;Short Term: Continued assessment and intervention until BP is < 140/87mm HG in hypertensive participants. < 130/67mm HG in hypertensive participants with diabetes, heart failure or chronic kidney disease.    Lipids  Yes    Intervention  Provide education and support for participant on nutrition & aerobic/resistive exercise  along with prescribed medications to achieve LDL 70mg , HDL >40mg .    Expected Outcomes  Short Term: Participant states understanding of desired cholesterol values and is compliant with medications prescribed. Participant is following exercise prescription and nutrition guidelines.;Long Term: Cholesterol controlled with medications as prescribed, with individualized exercise RX and with personalized nutrition plan. Value goals: LDL < 70mg , HDL > 40 mg.       Core Components/Risk Factors/Patient Goals Review:  Goals and Risk Factor Review    Row Name 08/11/17 1612 09/07/17 1149 09/07/17 1153         Core Components/Risk Factors/Patient Goals Review   Personal Goals Review  Weight Management/Obesity;Hypertension;Lipids  Weight Management/Obesity;Hypertension;Lipids  -     Review  Kirsten has had some intermittent exertional BP elevations with exercise. Patient asked to with exercise with workload expectations and not to overdue it  Proctor is doing well with exercise. Recent blood pressures are better controlled.  -     Expected Outcomes  Patient will take medicaiotns as presribed to reduce overall risk factors  Patient will take medicaiotns as presribed to reduce overall risk factors  Patient will take medications as presribed to reduce overall risk factors        Core Components/Risk Factors/Patient Goals at Discharge (Final Review):  Goals and Risk Factor Review - 09/07/17 1153      Core Components/Risk Factors/Patient Goals Review   Expected Outcomes  Patient will take medications as presribed to reduce overall risk factors       ITP Comments: ITP Comments    Row Name 07/25/17 0912 08/08/17 1642 09/07/17 1149       ITP Comments  Dr. Fransico Him, Medical  Director   30 day ITP review. Patient with good participation and attendance  30 day ITP review. Patient with good participation and attendance        Comments: See ITP comments.[name], RN,BSN 09/08/2017 2:01 PM

## 2017-09-08 ENCOUNTER — Other Ambulatory Visit (HOSPITAL_COMMUNITY): Payer: Medicare Other

## 2017-09-08 ENCOUNTER — Ambulatory Visit (HOSPITAL_COMMUNITY): Payer: Medicare Other | Attending: Internal Medicine

## 2017-09-08 ENCOUNTER — Other Ambulatory Visit: Payer: Self-pay

## 2017-09-08 ENCOUNTER — Encounter (HOSPITAL_COMMUNITY)
Admission: RE | Admit: 2017-09-08 | Discharge: 2017-09-08 | Disposition: A | Discharge status: Home / Self Care | Payer: Medicare Other | Source: Ambulatory Visit | Attending: Cardiovascular Disease | Admitting: Cardiovascular Disease

## 2017-09-08 DIAGNOSIS — K219 Gastro-esophageal reflux disease without esophagitis: Secondary | ICD-10-CM | POA: Diagnosis not present

## 2017-09-08 DIAGNOSIS — I2510 Atherosclerotic heart disease of native coronary artery without angina pectoris: Secondary | ICD-10-CM | POA: Diagnosis not present

## 2017-09-08 DIAGNOSIS — I110 Hypertensive heart disease with heart failure: Secondary | ICD-10-CM | POA: Insufficient documentation

## 2017-09-08 DIAGNOSIS — I447 Left bundle-branch block, unspecified: Secondary | ICD-10-CM | POA: Insufficient documentation

## 2017-09-08 DIAGNOSIS — Z952 Presence of prosthetic heart valve: Secondary | ICD-10-CM | POA: Diagnosis not present

## 2017-09-08 DIAGNOSIS — Z7982 Long term (current) use of aspirin: Secondary | ICD-10-CM | POA: Diagnosis not present

## 2017-09-08 DIAGNOSIS — I509 Heart failure, unspecified: Secondary | ICD-10-CM | POA: Insufficient documentation

## 2017-09-08 DIAGNOSIS — I071 Rheumatic tricuspid insufficiency: Secondary | ICD-10-CM | POA: Insufficient documentation

## 2017-09-08 DIAGNOSIS — I10 Essential (primary) hypertension: Secondary | ICD-10-CM | POA: Diagnosis not present

## 2017-09-08 DIAGNOSIS — E785 Hyperlipidemia, unspecified: Secondary | ICD-10-CM | POA: Insufficient documentation

## 2017-09-08 DIAGNOSIS — Z953 Presence of xenogenic heart valve: Secondary | ICD-10-CM | POA: Insufficient documentation

## 2017-09-08 DIAGNOSIS — Z48812 Encounter for surgical aftercare following surgery on the circulatory system: Secondary | ICD-10-CM | POA: Diagnosis not present

## 2017-09-08 DIAGNOSIS — Z79899 Other long term (current) drug therapy: Secondary | ICD-10-CM | POA: Diagnosis not present

## 2017-09-11 ENCOUNTER — Encounter (HOSPITAL_COMMUNITY): Payer: Medicare Other

## 2017-09-11 ENCOUNTER — Encounter (HOSPITAL_COMMUNITY)
Admission: RE | Admit: 2017-09-11 | Discharge: 2017-09-11 | Disposition: A | Discharge status: Home / Self Care | Payer: Medicare Other | Source: Ambulatory Visit | Attending: Cardiovascular Disease | Admitting: Cardiovascular Disease

## 2017-09-11 DIAGNOSIS — I10 Essential (primary) hypertension: Secondary | ICD-10-CM | POA: Diagnosis not present

## 2017-09-11 DIAGNOSIS — Z7982 Long term (current) use of aspirin: Secondary | ICD-10-CM | POA: Diagnosis not present

## 2017-09-11 DIAGNOSIS — K219 Gastro-esophageal reflux disease without esophagitis: Secondary | ICD-10-CM | POA: Diagnosis not present

## 2017-09-11 DIAGNOSIS — Z79899 Other long term (current) drug therapy: Secondary | ICD-10-CM | POA: Diagnosis not present

## 2017-09-11 DIAGNOSIS — Z952 Presence of prosthetic heart valve: Secondary | ICD-10-CM

## 2017-09-11 DIAGNOSIS — E785 Hyperlipidemia, unspecified: Secondary | ICD-10-CM | POA: Diagnosis not present

## 2017-09-13 ENCOUNTER — Encounter (HOSPITAL_COMMUNITY)
Admission: RE | Admit: 2017-09-13 | Discharge: 2017-09-13 | Disposition: A | Discharge status: Home / Self Care | Payer: Medicare Other | Source: Ambulatory Visit | Attending: Cardiovascular Disease | Admitting: Cardiovascular Disease

## 2017-09-13 ENCOUNTER — Encounter (HOSPITAL_COMMUNITY): Payer: Medicare Other

## 2017-09-13 DIAGNOSIS — K219 Gastro-esophageal reflux disease without esophagitis: Secondary | ICD-10-CM | POA: Insufficient documentation

## 2017-09-13 DIAGNOSIS — Z79899 Other long term (current) drug therapy: Secondary | ICD-10-CM | POA: Diagnosis not present

## 2017-09-13 DIAGNOSIS — Z952 Presence of prosthetic heart valve: Secondary | ICD-10-CM | POA: Insufficient documentation

## 2017-09-13 DIAGNOSIS — I480 Paroxysmal atrial fibrillation: Secondary | ICD-10-CM | POA: Insufficient documentation

## 2017-09-13 DIAGNOSIS — I493 Ventricular premature depolarization: Secondary | ICD-10-CM | POA: Diagnosis not present

## 2017-09-13 DIAGNOSIS — I10 Essential (primary) hypertension: Secondary | ICD-10-CM | POA: Insufficient documentation

## 2017-09-13 DIAGNOSIS — E785 Hyperlipidemia, unspecified: Secondary | ICD-10-CM | POA: Insufficient documentation

## 2017-09-13 DIAGNOSIS — Z7982 Long term (current) use of aspirin: Secondary | ICD-10-CM | POA: Diagnosis not present

## 2017-09-15 ENCOUNTER — Encounter (HOSPITAL_COMMUNITY): Payer: Medicare Other

## 2017-09-15 ENCOUNTER — Encounter (HOSPITAL_COMMUNITY)
Admission: RE | Admit: 2017-09-15 | Discharge: 2017-09-15 | Disposition: A | Discharge status: Home / Self Care | Payer: Medicare Other | Source: Ambulatory Visit | Attending: Cardiovascular Disease | Admitting: Cardiovascular Disease

## 2017-09-15 DIAGNOSIS — Z79899 Other long term (current) drug therapy: Secondary | ICD-10-CM | POA: Diagnosis not present

## 2017-09-15 DIAGNOSIS — Z952 Presence of prosthetic heart valve: Secondary | ICD-10-CM

## 2017-09-15 DIAGNOSIS — Z7982 Long term (current) use of aspirin: Secondary | ICD-10-CM | POA: Diagnosis not present

## 2017-09-15 DIAGNOSIS — K219 Gastro-esophageal reflux disease without esophagitis: Secondary | ICD-10-CM | POA: Diagnosis not present

## 2017-09-15 DIAGNOSIS — I10 Essential (primary) hypertension: Secondary | ICD-10-CM | POA: Diagnosis not present

## 2017-09-15 DIAGNOSIS — E785 Hyperlipidemia, unspecified: Secondary | ICD-10-CM | POA: Diagnosis not present

## 2017-09-18 ENCOUNTER — Encounter: Payer: Self-pay | Admitting: Family Medicine

## 2017-09-18 ENCOUNTER — Other Ambulatory Visit: Payer: Self-pay

## 2017-09-18 ENCOUNTER — Ambulatory Visit (INDEPENDENT_AMBULATORY_CARE_PROVIDER_SITE_OTHER): Payer: Medicare Other | Admitting: Family Medicine

## 2017-09-18 ENCOUNTER — Encounter (HOSPITAL_COMMUNITY): Payer: Medicare Other

## 2017-09-18 ENCOUNTER — Encounter: Payer: Self-pay | Admitting: General Practice

## 2017-09-18 ENCOUNTER — Encounter (HOSPITAL_COMMUNITY)
Admission: RE | Admit: 2017-09-18 | Discharge: 2017-09-18 | Disposition: A | Discharge status: Home / Self Care | Payer: Medicare Other | Source: Ambulatory Visit | Attending: Cardiovascular Disease | Admitting: Cardiovascular Disease

## 2017-09-18 VITALS — BP 130/78 | HR 80 | Temp 97.9°F | Resp 16 | Ht 66.0 in | Wt 173.1 lb

## 2017-09-18 DIAGNOSIS — Z7982 Long term (current) use of aspirin: Secondary | ICD-10-CM | POA: Diagnosis not present

## 2017-09-18 DIAGNOSIS — I428 Other cardiomyopathies: Secondary | ICD-10-CM | POA: Diagnosis not present

## 2017-09-18 DIAGNOSIS — Z23 Encounter for immunization: Secondary | ICD-10-CM | POA: Diagnosis not present

## 2017-09-18 DIAGNOSIS — I10 Essential (primary) hypertension: Secondary | ICD-10-CM

## 2017-09-18 DIAGNOSIS — E7849 Other hyperlipidemia: Secondary | ICD-10-CM

## 2017-09-18 DIAGNOSIS — K219 Gastro-esophageal reflux disease without esophagitis: Secondary | ICD-10-CM | POA: Diagnosis not present

## 2017-09-18 DIAGNOSIS — Z952 Presence of prosthetic heart valve: Secondary | ICD-10-CM | POA: Diagnosis not present

## 2017-09-18 DIAGNOSIS — E785 Hyperlipidemia, unspecified: Secondary | ICD-10-CM | POA: Diagnosis not present

## 2017-09-18 DIAGNOSIS — I480 Paroxysmal atrial fibrillation: Secondary | ICD-10-CM

## 2017-09-18 DIAGNOSIS — Z79899 Other long term (current) drug therapy: Secondary | ICD-10-CM | POA: Diagnosis not present

## 2017-09-18 LAB — CBC WITH DIFFERENTIAL/PLATELET
Basophils Absolute: 0 10*3/uL (ref 0.0–0.1)
Basophils Relative: 0.6 % (ref 0.0–3.0)
EOS ABS: 0.1 10*3/uL (ref 0.0–0.7)
EOS PCT: 2.9 % (ref 0.0–5.0)
HEMATOCRIT: 43.9 % (ref 39.0–52.0)
HEMOGLOBIN: 14.1 g/dL (ref 13.0–17.0)
LYMPHS PCT: 23.5 % (ref 12.0–46.0)
Lymphs Abs: 1.2 10*3/uL (ref 0.7–4.0)
MCHC: 32.2 g/dL (ref 30.0–36.0)
MCV: 90.8 fl (ref 78.0–100.0)
Monocytes Absolute: 0.5 10*3/uL (ref 0.1–1.0)
Monocytes Relative: 9.6 % (ref 3.0–12.0)
Neutro Abs: 3.2 10*3/uL (ref 1.4–7.7)
Neutrophils Relative %: 63.4 % (ref 43.0–77.0)
Platelets: 207 10*3/uL (ref 150.0–400.0)
RBC: 4.84 Mil/uL (ref 4.22–5.81)
RDW: 16.8 % — ABNORMAL HIGH (ref 11.5–15.5)
WBC: 5 10*3/uL (ref 4.0–10.5)

## 2017-09-18 LAB — BASIC METABOLIC PANEL
BUN: 16 mg/dL (ref 6–23)
CALCIUM: 9.4 mg/dL (ref 8.4–10.5)
CO2: 29 mEq/L (ref 19–32)
CREATININE: 1.23 mg/dL (ref 0.40–1.50)
Chloride: 102 mEq/L (ref 96–112)
GFR: 62.37 mL/min (ref >60.00)
Glucose, Bld: 103 mg/dL — ABNORMAL HIGH (ref 70–99)
Potassium: 4.7 mEq/L (ref 3.5–5.1)
Sodium: 138 mEq/L (ref 135–145)

## 2017-09-18 LAB — LIPID PANEL
Cholesterol: 177 mg/dL (ref 0–200)
HDL: 60 mg/dL (ref >39.00)
LDL CALC: 86 mg/dL (ref 0–99)
NonHDL: 117.05
TRIGLYCERIDES: 154 mg/dL — AB (ref 0.0–149.0)
Total CHOL/HDL Ratio: 3
VLDL: 30.8 mg/dL (ref 0.0–40.0)

## 2017-09-18 LAB — HEPATIC FUNCTION PANEL
ALT: 33 U/L (ref 0–53)
AST: 25 U/L (ref 0–37)
Albumin: 4.2 g/dL (ref 3.5–5.2)
Alkaline Phosphatase: 76 U/L (ref 39–117)
Bilirubin, Direct: 0.1 mg/dL (ref 0.0–0.3)
Total Bilirubin: 0.4 mg/dL (ref 0.2–1.2)
Total Protein: 6.2 g/dL (ref 6.0–8.3)

## 2017-09-18 MED ORDER — TRAZODONE HCL 50 MG PO TABS: 25.0000 mg | ORAL_TABLET | Freq: Every evening | ORAL | 3 refills | Status: DC | PRN

## 2017-09-18 NOTE — Assessment & Plan Note (Signed)
Following w/ cardiology.  Stopped Amiodarone 5 days ago.  On ASA 325mg .  Will follow along.

## 2017-09-18 NOTE — Progress Notes (Signed)
   Subjective:    Patient ID: [name] Brown, male    DOB: 1951/01/16, 67 y.o.   MRN: [id number]  HPI HTN- chronic problem.  Now on Metoprolol and Losartan w/ good control.  No CP, palpitations (5 days off amiodarone), SOB, HAs, edema.  Hyperlipidemia- chronic problem, on Zetia due to statin intolerance (elevated liver enzymes).  No abd pain, N/V.  Exercising regularly in Cardiac Rehab.  CHF- pt reports last week's ECHO showed improvement in EF from 35--> 45-50%   Review of Systems For ROS see HPI     Objective:   Physical Exam  Constitutional: He is oriented to person, place, and time. He appears well-developed and well-nourished. No distress.  HENT:  Head: Normocephalic and atraumatic.  Eyes: Conjunctivae and EOM are normal. Pupils are equal, round, and reactive to light.  Neck: Normal range of motion. Neck supple. No thyromegaly present.  Cardiovascular: Normal rate, regular rhythm, normal heart sounds and intact distal pulses.  No murmur heard. Pulmonary/Chest: Effort normal and breath sounds normal. No respiratory distress.  Abdominal: Soft. Bowel sounds are normal. He exhibits no distension.  Musculoskeletal: He exhibits no edema.  Lymphadenopathy:    He has no cervical adenopathy.  Neurological: He is alert and oriented to person, place, and time. No cranial nerve deficit.  Skin: Skin is warm and dry.  Psychiatric: He has a normal mood and affect. His behavior is normal.  Vitals reviewed.         Assessment & Plan:

## 2017-09-18 NOTE — Progress Notes (Signed)
Chez Bulnes 66 y.o. male DOB: 1950-09-13 MRN: [id number]      Nutrition Note  1. 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 Nutrition plan and MEDFICTS results reviewed with pt. Pt is a lacto-ovo-pescatarian following a heart healthy diet. Pt wants to lose wt and c/o wt loss plateau. Wt loss tips reviewed. Discussed possiblity of HIIT with [name], EP, covering pt's class. Pt expressed understanding of the information reviewed. Pt aware of nutrition education classes offered.   Nutrition Diagnosis ? Food-and nutrition-related knowledge deficit related to lack of exposure to information as related to diagnosis of: ? AVR ? Overweight related to excessive energy intake as evidenced by a BMI of 26.6  Nutrition Intervention ? Pt's individual nutrition plan reviewed with pt. ? Benefits of adopting Heart Healthy diet discussed when Medficts reviewed.    Nutrition Goal(s):  ? Pt to identify food quantities necessary to achieve weight loss of 6-12 lb at graduation from cardiac rehab. Goal wt of 153 lb desired.   Plan:  Pt to attend nutrition classes ? Portion Distortion  Will provide client-centered nutrition education as part of interdisciplinary care.   Monitor and evaluate progress toward nutrition goal with team.  [name] Brown, M.Ed, RD, LDN, CDE 09/18/2017 10:15 AM

## 2017-09-18 NOTE — Patient Instructions (Signed)
Follow up in 6 months with me and schedule your Medicare Wellness Visit w/ [facility] notify you of your lab results and make any changes if needed Keep up the good work!  You look great! Call with any questions or concerns Happy New Year!

## 2017-09-18 NOTE — Assessment & Plan Note (Signed)
Following w/ Cardiology.  Last week's ECHO showed EF improvement to 45-50%.  Pt is currently asymptomatic.  On ASA, ARB, Beta Blocker.  Will follow along.

## 2017-09-18 NOTE — Assessment & Plan Note (Signed)
Chronic problem.  Well controlled.  Asymptomatic.  Check labs.  No anticipated med changes.  Will follow. 

## 2017-09-18 NOTE — Assessment & Plan Note (Signed)
Chronic problem.  Statin intolerant due to elevated liver enzymes.  On Zetia w/o difficulty.  Check labs.  Adjust meds prn

## 2017-09-20 ENCOUNTER — Encounter (HOSPITAL_COMMUNITY): Payer: Medicare Other

## 2017-09-20 ENCOUNTER — Encounter (HOSPITAL_COMMUNITY)
Admission: RE | Admit: 2017-09-20 | Discharge: 2017-09-20 | Disposition: A | Discharge status: Home / Self Care | Payer: Medicare Other | Source: Ambulatory Visit | Attending: Cardiovascular Disease | Admitting: Cardiovascular Disease

## 2017-09-20 DIAGNOSIS — I10 Essential (primary) hypertension: Secondary | ICD-10-CM | POA: Diagnosis not present

## 2017-09-20 DIAGNOSIS — Z79899 Other long term (current) drug therapy: Secondary | ICD-10-CM | POA: Diagnosis not present

## 2017-09-20 DIAGNOSIS — Z952 Presence of prosthetic heart valve: Secondary | ICD-10-CM | POA: Diagnosis not present

## 2017-09-20 DIAGNOSIS — E785 Hyperlipidemia, unspecified: Secondary | ICD-10-CM | POA: Diagnosis not present

## 2017-09-20 DIAGNOSIS — Z7982 Long term (current) use of aspirin: Secondary | ICD-10-CM | POA: Diagnosis not present

## 2017-09-20 DIAGNOSIS — K219 Gastro-esophageal reflux disease without esophagitis: Secondary | ICD-10-CM | POA: Diagnosis not present

## 2017-09-22 ENCOUNTER — Encounter (HOSPITAL_COMMUNITY): Payer: Medicare Other

## 2017-09-22 ENCOUNTER — Encounter (HOSPITAL_COMMUNITY)
Admission: RE | Admit: 2017-09-22 | Discharge: 2017-09-22 | Disposition: A | Discharge status: Home / Self Care | Payer: Medicare Other | Source: Ambulatory Visit | Attending: Cardiovascular Disease | Admitting: Cardiovascular Disease

## 2017-09-22 DIAGNOSIS — Z79899 Other long term (current) drug therapy: Secondary | ICD-10-CM | POA: Diagnosis not present

## 2017-09-22 DIAGNOSIS — Z7982 Long term (current) use of aspirin: Secondary | ICD-10-CM | POA: Diagnosis not present

## 2017-09-22 DIAGNOSIS — E785 Hyperlipidemia, unspecified: Secondary | ICD-10-CM | POA: Diagnosis not present

## 2017-09-22 DIAGNOSIS — K219 Gastro-esophageal reflux disease without esophagitis: Secondary | ICD-10-CM | POA: Diagnosis not present

## 2017-09-22 DIAGNOSIS — Z952 Presence of prosthetic heart valve: Secondary | ICD-10-CM | POA: Diagnosis not present

## 2017-09-22 DIAGNOSIS — I10 Essential (primary) hypertension: Secondary | ICD-10-CM | POA: Diagnosis not present

## 2017-09-25 ENCOUNTER — Encounter (HOSPITAL_COMMUNITY)
Admission: RE | Admit: 2017-09-25 | Discharge: 2017-09-25 | Disposition: A | Discharge status: Home / Self Care | Payer: Medicare Other | Source: Ambulatory Visit | Attending: Cardiovascular Disease | Admitting: Cardiovascular Disease

## 2017-09-25 ENCOUNTER — Encounter (HOSPITAL_COMMUNITY): Payer: Medicare Other

## 2017-09-25 DIAGNOSIS — E785 Hyperlipidemia, unspecified: Secondary | ICD-10-CM | POA: Diagnosis not present

## 2017-09-25 DIAGNOSIS — Z7982 Long term (current) use of aspirin: Secondary | ICD-10-CM | POA: Diagnosis not present

## 2017-09-25 DIAGNOSIS — I10 Essential (primary) hypertension: Secondary | ICD-10-CM | POA: Diagnosis not present

## 2017-09-25 DIAGNOSIS — Z952 Presence of prosthetic heart valve: Secondary | ICD-10-CM | POA: Diagnosis not present

## 2017-09-25 DIAGNOSIS — Z79899 Other long term (current) drug therapy: Secondary | ICD-10-CM | POA: Diagnosis not present

## 2017-09-25 DIAGNOSIS — K219 Gastro-esophageal reflux disease without esophagitis: Secondary | ICD-10-CM | POA: Diagnosis not present

## 2017-09-27 ENCOUNTER — Encounter (HOSPITAL_COMMUNITY): Payer: Medicare Other

## 2017-09-27 ENCOUNTER — Encounter (HOSPITAL_COMMUNITY)
Admission: RE | Admit: 2017-09-27 | Discharge: 2017-09-27 | Disposition: A | Discharge status: Home / Self Care | Payer: Medicare Other | Source: Ambulatory Visit | Attending: Cardiovascular Disease | Admitting: Cardiovascular Disease

## 2017-09-27 DIAGNOSIS — K219 Gastro-esophageal reflux disease without esophagitis: Secondary | ICD-10-CM | POA: Diagnosis not present

## 2017-09-27 DIAGNOSIS — Z952 Presence of prosthetic heart valve: Secondary | ICD-10-CM | POA: Diagnosis not present

## 2017-09-27 DIAGNOSIS — I10 Essential (primary) hypertension: Secondary | ICD-10-CM | POA: Diagnosis not present

## 2017-09-27 DIAGNOSIS — E785 Hyperlipidemia, unspecified: Secondary | ICD-10-CM | POA: Diagnosis not present

## 2017-09-27 DIAGNOSIS — Z79899 Other long term (current) drug therapy: Secondary | ICD-10-CM | POA: Diagnosis not present

## 2017-09-27 DIAGNOSIS — Z7982 Long term (current) use of aspirin: Secondary | ICD-10-CM | POA: Diagnosis not present

## 2017-09-29 ENCOUNTER — Encounter (HOSPITAL_COMMUNITY)
Admission: RE | Admit: 2017-09-29 | Discharge: 2017-09-29 | Disposition: A | Discharge status: Home / Self Care | Payer: Medicare Other | Source: Ambulatory Visit | Attending: Cardiovascular Disease | Admitting: Cardiovascular Disease

## 2017-09-29 ENCOUNTER — Encounter (HOSPITAL_COMMUNITY): Payer: Medicare Other

## 2017-09-29 DIAGNOSIS — K219 Gastro-esophageal reflux disease without esophagitis: Secondary | ICD-10-CM | POA: Diagnosis not present

## 2017-09-29 DIAGNOSIS — Z952 Presence of prosthetic heart valve: Secondary | ICD-10-CM | POA: Diagnosis not present

## 2017-09-29 DIAGNOSIS — E785 Hyperlipidemia, unspecified: Secondary | ICD-10-CM | POA: Diagnosis not present

## 2017-09-29 DIAGNOSIS — I10 Essential (primary) hypertension: Secondary | ICD-10-CM | POA: Diagnosis not present

## 2017-09-29 DIAGNOSIS — Z79899 Other long term (current) drug therapy: Secondary | ICD-10-CM | POA: Diagnosis not present

## 2017-09-29 DIAGNOSIS — Z7982 Long term (current) use of aspirin: Secondary | ICD-10-CM | POA: Diagnosis not present

## 2017-10-02 ENCOUNTER — Encounter (HOSPITAL_COMMUNITY): Payer: Medicare Other

## 2017-10-02 ENCOUNTER — Encounter (HOSPITAL_COMMUNITY)
Admission: RE | Admit: 2017-10-02 | Discharge: 2017-10-02 | Disposition: A | Discharge status: Home / Self Care | Payer: Medicare Other | Source: Ambulatory Visit | Attending: Cardiovascular Disease | Admitting: Cardiovascular Disease

## 2017-10-02 DIAGNOSIS — Z7982 Long term (current) use of aspirin: Secondary | ICD-10-CM | POA: Diagnosis not present

## 2017-10-02 DIAGNOSIS — E785 Hyperlipidemia, unspecified: Secondary | ICD-10-CM | POA: Diagnosis not present

## 2017-10-02 DIAGNOSIS — Z952 Presence of prosthetic heart valve: Secondary | ICD-10-CM

## 2017-10-02 DIAGNOSIS — Z79899 Other long term (current) drug therapy: Secondary | ICD-10-CM | POA: Diagnosis not present

## 2017-10-02 DIAGNOSIS — I10 Essential (primary) hypertension: Secondary | ICD-10-CM | POA: Diagnosis not present

## 2017-10-02 DIAGNOSIS — K219 Gastro-esophageal reflux disease without esophagitis: Secondary | ICD-10-CM | POA: Diagnosis not present

## 2017-10-03 NOTE — Progress Notes (Signed)
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  aspirin EC 325 MG EC tablet, Take 1 tablet (325 mg total) by mouth daily., Disp: 30 tablet, Rfl: 0 .  ezetimibe (ZETIA) 10 MG tablet, Take 10 mg by mouth daily., Disp: , Rfl:  .  famotidine (PEPCID) 20 MG tablet, Take 20 mg by mouth daily as needed for heartburn or indigestion., Disp: , Rfl:  .  losartan (COZAAR) 25 MG tablet, Take 1 tablet (25 mg total) by mouth daily., Disp: 90 tablet, Rfl: 1 .  MELATONIN PO, Take 5 mg at bedtime as needed by mouth (sleep). , Disp: , Rfl:  .  metoprolol succinate (TOPROL XL) 25 MG 24 hr tablet, Take 1 tablet (25 mg total) by mouth 2 (two) times daily., Disp: 60 tablet, Rfl: 3 .  traZODone (DESYREL) 50 MG tablet, Take 0.5-1 tablets (25-50 mg total) by mouth at bedtime as needed for sleep., Disp: 30 tablet, Rfl: 3  Past Medical History: Past Medical History:  Diagnosis Date  . Aortic stenosis   . Colon cancer ([place])   . Family history of adverse reaction to anesthesia    pt states his mother had 2 episodes of hyponatremia following anesthesia   . GERD (gastroesophageal reflux disease)   . Headache   . Heart murmur   . Hyperlipidemia 10/25/2016  . Hypertension    pt is currently not taking any medications; pt states is borderline  . Melanoma ([place])   . PAF (paroxysmal atrial fibrillation) ([place]) 06/13/2017  . Perforated sigmoid colon ([place])   . PVC's (premature ventricular  contractions) 10/25/2016  . Tinnitus   . Wears glasses     Tobacco Use: Social History   Tobacco Use  Smoking Status Never Smoker  Smokeless Tobacco Never Used    Labs: Recent Review Flowsheet Data    Labs for ITP Cardiac and Pulmonary Rehab Latest Ref Rng & Units 06/08/2017 06/08/2017 06/08/2017 06/09/2017 09/18/2017   Cholestrol 0 - 200 mg/dL - - - - 177   LDLCALC 0 - 99 mg/dL - - - - 86   HDL >39.00 mg/dL - - - - 60.00   Trlycerides 0.0 - 149.0 mg/dL - - - - 154.0(H)   Hemoglobin A1c 4.8 - 5.6 % - - - - -   PHART 7.350 - 7.450 7.449 7.404 - - -   PCO2ART 32.0 - 48.0 mmHg 33.2 38.6 - - -   HCO3 20.0 - 28.0 mmol/L 23.2 24.4 - - -   TCO2 22 - 32 mmol/L 24 26 23 25  -   ACIDBASEDEF 0.0 - 2.0 mmol/L 1.0 1.0 - - -   O2SAT % 99.0 99.0 - - -      Capillary Blood Glucose: Lab Results  Component Value Date   GLUCAP 154 (H) 06/10/2017   GLUCAP 121 (H) 06/10/2017   GLUCAP 135 (H) 06/10/2017   GLUCAP 133 (H) 06/09/2017   GLUCAP 150 (H) 06/09/2017  Exercise Target Goals:    Exercise Program Goal: Individual exercise prescription set using results from initial 6 min walk test and THRR while considering  patient's activity barriers and safety.   Exercise Prescription Goal: Initial exercise prescription builds to 30-45 minutes a day of aerobic activity, 2-3 days per week.  Home exercise guidelines will be given to patient during program as part of exercise prescription that the participant will acknowledge.  Activity Barriers & Risk Stratification: Activity Barriers & Cardiac Risk Stratification - 07/25/17 1515      Activity Barriers & Cardiac Risk Stratification   Cardiac Risk Stratification  High       6 Minute Walk: 6 Minute Walk    Row Name 07/25/17 1208         6 Minute Walk   Phase  Initial     Distance  1728 feet     Walk Time  6 minutes     # of Rest Breaks  0     MPH  3.3     METS  3.78     RPE  11     VO2 Peak  13.23     Symptoms  No     Resting HR   62 bpm     Resting BP  128/80     Resting Oxygen Saturation   97 %     Exercise Oxygen Saturation  during 6 min walk  98 %     Max Ex. HR  95 bpm     Max Ex. BP  128/70     2 Minute Post BP  114/72        Oxygen Initial Assessment:   Oxygen Re-Evaluation:   Oxygen Discharge (Final Oxygen Re-Evaluation):   Initial Exercise Prescription: Initial Exercise Prescription - 07/25/17 1200      Date of Initial Exercise RX and Referring Provider   Date  07/25/17    Referring Provider  Skeet Latch MD      Treadmill   MPH  3    Grade  0    Minutes  10    METs  3.3      Bike   Level  0.8    Minutes  10    METs  2.9      NuStep   Level  3    SPM  80    Minutes  10    METs  2.5      Prescription Details   Frequency (times per week)  3    Duration  Progress to 30 minutes of continuous aerobic without signs/symptoms of physical distress      Intensity   THRR 40-80% of Max Heartrate  66-123    Ratings of Perceived Exertion  11-13    Perceived Dyspnea  0-4      Progression   Progression  Continue to progress workloads to maintain intensity without signs/symptoms of physical distress.      Resistance Training   Training Prescription  Yes    Weight  3lbs    Reps  10-15       Perform Capillary Blood Glucose checks as needed.  Exercise Prescription Changes:  Exercise Prescription Changes    Row Name 07/31/17 0952 08/09/17 1100 08/23/17 1000 09/01/17 0950 09/25/17 0950     Response to Exercise   Blood Pressure (Admit)  128/82  138/82  122/72  108/70  108/70   Blood Pressure (Exercise)  178/96  144/70  154/80  144/78  148/80  Blood Pressure (Exit)  128/86  138/80  108/82  114/70  108/66   Heart Rate (Admit)  78 bpm  76 bpm  63 bpm  60 bpm  64 bpm   Heart Rate (Exercise)  109 bpm  123 bpm  115 bpm  118 bpm  124 bpm   Heart Rate (Exit)  78 bpm  77 bpm  71 bpm  56 bpm  69 bpm   Perceived Dyspnea (Exercise)  13  12  14  13  14    Symptoms  none  none  none  none   none   Duration  Continue with 30 min of aerobic exercise without signs/symptoms of physical distress.  Continue with 30 min of aerobic exercise without signs/symptoms of physical distress.  Continue with 30 min of aerobic exercise without signs/symptoms of physical distress.  Continue with 30 min of aerobic exercise without signs/symptoms of physical distress.  Continue with 30 min of aerobic exercise without signs/symptoms of physical distress.   Intensity  THRR unchanged  THRR unchanged  THRR unchanged  THRR unchanged  THRR unchanged     Progression   Progression  Continue to progress workloads to maintain intensity without signs/symptoms of physical distress.  Continue to progress workloads to maintain intensity without signs/symptoms of physical distress.  Continue to progress workloads to maintain intensity without signs/symptoms of physical distress.  Continue to progress workloads to maintain intensity without signs/symptoms of physical distress.  Continue to progress workloads to maintain intensity without signs/symptoms of physical distress.   Average METs  3.1  5.1  5.2  5.5  5.8     Resistance Training   Training Prescription  Yes  No Relaxation today  Yes  Yes  Yes   Weight  3lbs  -  5lbs  5lbs  8lbs   Reps  10-15  -  10-15  10-15  10-15   Time  10 Minutes  -  10 Minutes  10 Minutes  10 Minutes     Interval Training   Interval Training  No  No  No  No  No     Treadmill   MPH  3  3.2  3.3  3.3  3.3   Grade  0  3  3  5  5    Minutes  10  4.54  10  10  10    METs  3.3  4.54  4.89  5.8  5.8     Bike   Level  0.8  1.8  1.8  1.8  1.9   Minutes  10  10  10  10  10    METs  2.98  5.39  5.37  5.35  5.48     NuStep   Level  3  4  4  4  4    SPM  80  80  80  80  95   Minutes  10  10  10  10  10    METs  2.9  5.4  5.4  5.4  6.1     Home Exercise Plan   Plans to continue exercise at  Home (comment)  Home (comment)  Home (comment)  Home (comment)  Home (comment)   Frequency  Add 3  additional days to program exercise sessions.  Add 3 additional days to program exercise sessions.  Add 3 additional days to program exercise sessions.  Add 3 additional days to program exercise sessions.  Add 3 additional days to program exercise sessions.   Initial Home  Exercises Provided  -  08/09/17  08/09/17  08/09/17  08/09/17      Exercise Comments:  Exercise Comments    Row Name 07/31/17 1412 08/09/17 0953 08/23/17 1046 09/25/17 0950     Exercise Comments  Off to a good start with exercise.  Reviewed home exercise guidelines, METs and goals.  Reviewed METs and goals with patient.  Reviewed METs and goals with patient.       Exercise Goals and Review:  Exercise Goals    Row Name 07/25/17 1215             Exercise Goals   Increase Physical Activity  Yes       Intervention  Provide advice, education, support and counseling about physical activity/exercise needs.;Develop an individualized exercise prescription for aerobic and resistive training based on initial evaluation findings, risk stratification, comorbidities and participant's personal goals.       Expected Outcomes  Achievement of increased cardiorespiratory fitness and enhanced flexibility, muscular endurance and strength shown through measurements of functional capacity and personal statement of participant.       Increase Strength and Stamina  Yes increase UE strength       Intervention  Provide advice, education, support and counseling about physical activity/exercise needs.;Develop an individualized exercise prescription for aerobic and resistive training based on initial evaluation findings, risk stratification, comorbidities and participant's personal goals.       Expected Outcomes  Achievement of increased cardiorespiratory fitness and enhanced flexibility, muscular endurance and strength shown through measurements of functional capacity and personal statement of participant.       Able to understand and use rate of  perceived exertion (RPE) scale  Yes       Intervention  Provide education and explanation on how to use RPE scale       Expected Outcomes  Short Term: Able to use RPE daily in rehab to express subjective intensity level;Long Term:  Able to use RPE to guide intensity level when exercising independently       Knowledge and understanding of Target Heart Rate Range (THRR)  Yes       Intervention  Provide education and explanation of THRR including how the numbers were predicted and where they are located for reference       Expected Outcomes  Short Term: Able to state/look up THRR;Long Term: Able to use THRR to govern intensity when exercising independently;Short Term: Able to use daily as guideline for intensity in rehab       Able to check pulse independently  Yes       Intervention  Provide education and demonstration on how to check pulse in carotid and radial arteries.;Review the importance of being able to check your own pulse for safety during independent exercise       Expected Outcomes  Short Term: Able to explain why pulse checking is important during independent exercise;Long Term: Able to check pulse independently and accurately       Understanding of Exercise Prescription  Yes       Intervention  Provide education, explanation, and written materials on patient's individual exercise prescription       Expected Outcomes  Short Term: Able to explain program exercise prescription;Long Term: Able to explain home exercise prescription to exercise independently          Exercise Goals Re-Evaluation : Exercise Goals Re-Evaluation    Row Name 07/31/17 1412 08/09/17 1015 08/23/17 1044 09/25/17 0950       Exercise Goal  Re-Evaluation   Exercise Goals Review  Increase Physical Activity  Understanding of Exercise Prescription  Increase Physical Activity;Able to understand and use rate of perceived exertion (RPE) scale;Knowledge and understanding of Target Heart Rate Range (THRR)  Increase Strength  and Stamina    Comments  Patient tolerated first day of exercise well, no complaints. Pt states he is walking approximately 3 miles 3-4 days/week in about 60 minute sessions.  Reviewed home exercise guidelines including THRR, RPE scale, and endpoints for exercise. Pt states he is walking approximately 3 miles 3-4 days/week in about 55-60 minute sessions.  Patient is progressing well with exercise and steadily increasing workloads as tolerated. Reviewed THRR and RPE scale as it relates to increasing intensity of exercise.  Patient is doing well with exercise. Pt is walking 3-3.5 miles, 1-2 days/ week at home in addition to exercise at [facility].     Expected Outcomes  Increase workloads as tolerated to help achieve health and fitness goals.  Continue walking at home in addition to exercise at cardiac rehab.  Increase workloads as tolerated to achieve health and fitness benefits.  Patient will increase duration of exercise at home to help achieve weight loss goals. Pt will increase hand weights to 8lbs to help increase strength.        Discharge Exercise Prescription (Final Exercise Prescription Changes): Exercise Prescription Changes - 09/25/17 0950      Response to Exercise   Blood Pressure (Admit)  108/70    Blood Pressure (Exercise)  148/80    Blood Pressure (Exit)  108/66    Heart Rate (Admit)  64 bpm    Heart Rate (Exercise)  124 bpm    Heart Rate (Exit)  69 bpm    Perceived Dyspnea (Exercise)  14    Symptoms  none    Duration  Continue with 30 min of aerobic exercise without signs/symptoms of physical distress.    Intensity  THRR unchanged      Progression   Progression  Continue to progress workloads to maintain intensity without signs/symptoms of physical distress.    Average METs  5.8      Resistance Training   Training Prescription  Yes    Weight  8lbs    Reps  10-15    Time  10 Minutes      Interval Training   Interval Training  No      Treadmill   MPH  3.3    Grade  5     Minutes  10    METs  5.8      Bike   Level  1.9    Minutes  10    METs  5.48      NuStep   Level  4    SPM  95    Minutes  10    METs  6.1      Home Exercise Plan   Plans to continue exercise at  Home (comment)    Frequency  Add 3 additional days to program exercise sessions.    Initial Home Exercises Provided  08/09/17       Nutrition:  Target Goals: Understanding of nutrition guidelines, daily intake of sodium 1500mg , cholesterol 200mg , calories 30% from fat and 7% or less from saturated fats, daily to have 5 or more servings of fruits and vegetables.  Biometrics: Pre Biometrics - 07/25/17 1515      Pre Biometrics   Height  5' 6.25" (1.683 m)    Weight  171 lb  8.3 oz (77.8 kg)    Waist Circumference  37 inches    Hip Circumference  39.5 inches    Waist to Hip Ratio  0.94 %    BMI (Calculated)  27.47    Triceps Skinfold  22 mm    % Body Fat  27.8 %    Grip Strength  49 kg    Flexibility  12 in    Single Leg Stand  30 seconds        Nutrition Therapy Plan and Nutrition Goals: Nutrition Therapy & Goals - 07/25/17 1058      Nutrition Therapy   Diet  General, healthful      Personal Nutrition Goals   Nutrition Goal  Pt to identify food quantities necessary to achieve weight loss of 6-12 lb at graduation from cardiac rehab. Goal wt of 153 lb desired.       Intervention Plan   Intervention  Prescribe, educate and counsel regarding individualized specific dietary modifications aiming towards targeted core components such as weight, hypertension, lipid management, diabetes, heart failure and other comorbidities.    Expected Outcomes  Short Term Goal: Understand basic principles of dietary content, such as calories, fat, sodium, cholesterol and nutrients.;Long Term Goal: Adherence to prescribed nutrition plan.       Nutrition Assessments: Nutrition Assessments - 07/25/17 1059      MEDFICTS Scores   Pre Score  40       Nutrition Goals  Re-Evaluation:   Nutrition Goals Re-Evaluation:   Nutrition Goals Discharge (Final Nutrition Goals Re-Evaluation):   Psychosocial: Target Goals: Acknowledge presence or absence of significant depression and/or stress, maximize coping skills, provide positive support system. Participant is able to verbalize types and ability to use techniques and skills needed for reducing stress and depression.  Initial Review & Psychosocial Screening: Initial Psych Review & Screening - 07/25/17 0937      Initial Review   Current issues with  None Identified      Family Dynamics   Good Support System?  Yes wife,sister, brother, extended family    Comments  upon brief assessment, no psychosocial needs identified, no interventions necessary       Barriers   Psychosocial barriers to participate in program  There are no identifiable barriers or psychosocial needs.      Screening Interventions   Interventions  Encouraged to exercise;Provide feedback about the scores to participant       Quality of Life Scores: Quality of Life - 07/25/17 0936      Quality of Life Scores   Health/Function Pre  24.97 %    Socioeconomic Pre  26.06 %    Psych/Spiritual Pre  21.79 %    Family Pre  24.6 %    GLOBAL Pre  24.53 %      Scores of 19 and below usually indicate a poorer quality of life in these areas.  A difference of  2-3 points is a clinically meaningful difference.  A difference of 2-3 points in the total score of the Quality of Life Index has been associated with significant improvement in overall quality of life, self-image, physical symptoms, and general health in studies assessing change in quality of life.  PHQ-9: Recent Review Flowsheet Data    Depression screen [facility] 2/9 09/18/2017 07/31/2017 03/23/2017 03/23/2017 02/07/2017   Decreased Interest 0 0 0 0 0   Down, Depressed, Hopeless 0 0 0 0 0   PHQ - 2 Score 0 0 0 0 0   Altered  sleeping 0 - - 0 0   Tired, decreased energy 0 - - 0 0   Change in  appetite 0 - - 0 0   Feeling bad or failure about yourself  0 - - 0 0   Trouble concentrating 0 - - 0 0   Moving slowly or fidgety/restless 0 - - 0 0   Suicidal thoughts 0 - - 0 0   PHQ-9 Score 0 - - 0 0   Difficult doing work/chores Not difficult at all - - - -     Interpretation of Total Score  Total Score Depression Severity:  1-4 = Minimal depression, 5-9 = Mild depression, 10-14 = Moderate depression, 15-19 = Moderately severe depression, 20-27 = Severe depression   Psychosocial Evaluation and Intervention:   Psychosocial Re-Evaluation: Psychosocial Re-Evaluation    Row Name 08/11/17 1616 09/07/17 1151 10/03/17 1333         Psychosocial Re-Evaluation   Current issues with  None Identified  None Identified  None Identified     Interventions  Encouraged to attend Cardiac Rehabilitation for the exercise  Encouraged to attend Cardiac Rehabilitation for the exercise  Encouraged to attend Cardiac Rehabilitation for the exercise     Continue Psychosocial Services   No Follow up required  No Follow up required  No Follow up required        Psychosocial Discharge (Final Psychosocial Re-Evaluation): Psychosocial Re-Evaluation - 10/03/17 1333      Psychosocial Re-Evaluation   Current issues with  None Identified    Interventions  Encouraged to attend Cardiac Rehabilitation for the exercise    Continue Psychosocial Services   No Follow up required       Vocational Rehabilitation: Provide vocational rehab assistance to qualifying candidates.   Vocational Rehab Evaluation & Intervention: Vocational Rehab - 07/25/17 [phone]      Initial Vocational Rehab Evaluation & Intervention   Assessment shows need for Vocational Rehabilitation  No retired        Education: Education Goals: Education classes will be provided on a weekly basis, covering required topics. Participant will state understanding/return demonstration of topics presented.  Learning Barriers/Preferences: Learning  Barriers/Preferences - 07/25/17 1208      Learning Barriers/Preferences   Learning Barriers  Sight;Hearing glasses    Learning Preferences  Written Material       Education Topics: Count Your Pulse:  -Group instruction provided by verbal instruction, demonstration, patient participation and written materials to support subject.  Instructors address importance of being able to find your pulse and how to count your pulse when at home without a heart monitor.  Patients get hands on experience counting their pulse with staff help and individually.   Heart Attack, Angina, and Risk Factor Modification:  -Group instruction provided by verbal instruction, video, and written materials to support subject.  Instructors address signs and symptoms of angina and heart attacks.    Also discuss risk factors for heart disease and how to make changes to improve heart health risk factors.   Functional Fitness:  -Group instruction provided by verbal instruction, demonstration, patient participation, and written materials to support subject.  Instructors address safety measures for doing things around the house.  Discuss how to get up and down off the floor, how to pick things up properly, how to safely get out of a chair without assistance, and balance training.   Meditation and Mindfulness:  -Group instruction provided by verbal instruction, patient participation, and written materials to support subject.  Instructor addresses  importance of mindfulness and meditation practice to help reduce stress and improve awareness.  Instructor also leads participants through a meditation exercise.    Stretching for Flexibility and Mobility:  -Group instruction provided by verbal instruction, patient participation, and written materials to support subject.  Instructors lead participants through series of stretches that are designed to increase flexibility thus improving mobility.  These stretches are additional exercise  for major muscle groups that are typically performed during regular warm up and cool down.   Hands Only CPR:  -Group verbal, video, and participation provides a basic overview of AHA guidelines for community CPR. Role-play of emergencies allow participants the opportunity to practice calling for help and chest compression technique with discussion of AED use.   Hypertension: -Group verbal and written instruction that provides a basic overview of hypertension including the most recent diagnostic guidelines, risk factor reduction with self-care instructions and medication management.    Nutrition I class: Heart Healthy Eating:  -Group instruction provided by PowerPoint slides, verbal discussion, and written materials to support subject matter. The instructor gives an explanation and review of the Therapeutic Lifestyle Changes diet recommendations, which includes a discussion on lipid goals, dietary fat, sodium, fiber, plant stanol/sterol esters, sugar, and the components of a well-balanced, healthy diet.   Nutrition II class: Lifestyle Skills:  -Group instruction provided by PowerPoint slides, verbal discussion, and written materials to support subject matter. The instructor gives an explanation and review of label reading, grocery shopping for heart health, heart healthy recipe modifications, and ways to make healthier choices when eating out.   Diabetes Question & Answer:  -Group instruction provided by PowerPoint slides, verbal discussion, and written materials to support subject matter. The instructor gives an explanation and review of diabetes co-morbidities, pre- and post-prandial blood glucose goals, pre-exercise blood glucose goals, signs, symptoms, and treatment of hypoglycemia and hyperglycemia, and foot care basics.   Diabetes Blitz:  -Group instruction provided by PowerPoint slides, verbal discussion, and written materials to support subject matter. The instructor gives an  explanation and review of the physiology behind type 1 and type 2 diabetes, diabetes medications and rational behind using different medications, pre- and post-prandial blood glucose recommendations and Hemoglobin A1c goals, diabetes diet, and exercise including blood glucose guidelines for exercising safely.    Portion Distortion:  -Group instruction provided by PowerPoint slides, verbal discussion, written materials, and food models to support subject matter. The instructor gives an explanation of serving size versus portion size, changes in portions sizes over the last 20 years, and what consists of a serving from each food group.   Stress Management:  -Group instruction provided by verbal instruction, video, and written materials to support subject matter.  Instructors review role of stress in heart disease and how to cope with stress positively.     Exercising on Your Own:  -Group instruction provided by verbal instruction, power point, and written materials to support subject.  Instructors discuss benefits of exercise, components of exercise, frequency and intensity of exercise, and end points for exercise.  Also discuss use of nitroglycerin and activating EMS.  Review options of places to exercise outside of rehab.  Review guidelines for sex with heart disease.   Cardiac Drugs I:  -Group instruction provided by verbal instruction and written materials to support subject.  Instructor reviews cardiac drug classes: antiplatelets, anticoagulants, beta blockers, and statins.  Instructor discusses reasons, side effects, and lifestyle considerations for each drug class.   Cardiac Drugs II:  -Group instruction provided by  verbal instruction and written materials to support subject.  Instructor reviews cardiac drug classes: angiotensin converting enzyme inhibitors (ACE-I), angiotensin II receptor blockers (ARBs), nitrates, and calcium channel blockers.  Instructor discusses reasons, side effects,  and lifestyle considerations for each drug class.   Anatomy and Physiology of the Circulatory System:  Group verbal and written instruction and models provide basic cardiac anatomy and physiology, with the coronary electrical and arterial systems. Review of: AMI, Angina, Valve disease, Heart Failure, Peripheral Artery Disease, Cardiac Arrhythmia, Pacemakers, and the ICD.   Other Education:  -Group or individual verbal, written, or video instructions that support the educational goals of the cardiac rehab program.   Knowledge Questionnaire Score: Knowledge Questionnaire Score - 07/25/17 0938      Knowledge Questionnaire Score   Pre Score  23/24       Core Components/Risk Factors/Patient Goals at Admission: Personal Goals and Risk Factors at Admission - 07/25/17 1216      Core Components/Risk Factors/Patient Goals on Admission    Weight Management  Yes;Weight Loss    Intervention  Weight Management: Develop a combined nutrition and exercise program designed to reach desired caloric intake, while maintaining appropriate intake of nutrient and fiber, sodium and fats, and appropriate energy expenditure required for the weight goal.;Weight Management: Provide education and appropriate resources to help participant work on and attain dietary goals.;Weight Management/Obesity: Establish reasonable short term and long term weight goals.    Admit Weight  171 lb 8.3 oz (77.8 kg)    Goal Weight: Short Term  165 lb (74.8 kg)    Goal Weight: Long Term  159 lb (72.1 kg)    Expected Outcomes  Short Term: Continue to assess and modify interventions until short term weight is achieved;Long Term: Adherence to nutrition and physical activity/exercise program aimed toward attainment of established weight goal;Weight Maintenance: Understanding of the daily nutrition guidelines, which includes 25-35% calories from fat, 7% or less cal from saturated fats, less than 200mg  cholesterol, less than 1.5gm of sodium, &  5 or more servings of fruits and vegetables daily;Weight Loss: Understanding of general recommendations for a balanced deficit meal plan, which promotes 1-2 lb weight loss per week and includes a negative energy balance of [phone] kcal/d;Understanding recommendations for meals to include 15-35% energy as protein, 25-35% energy from fat, 35-60% energy from carbohydrates, less than 200mg  of dietary cholesterol, 20-35 gm of total fiber daily;Understanding of distribution of calorie intake throughout the day with the consumption of 4-5 meals/snacks    Hypertension  Yes    Intervention  Monitor prescription use compliance.;Provide education on lifestyle modifcations including regular physical activity/exercise, weight management, moderate sodium restriction and increased consumption of fresh fruit, vegetables, and low fat dairy, alcohol moderation, and smoking cessation.    Expected Outcomes  Long Term: Maintenance of blood pressure at goal levels.;Short Term: Continued assessment and intervention until BP is < 140/30mm HG in hypertensive participants. < 130/72mm HG in hypertensive participants with diabetes, heart failure or chronic kidney disease.    Lipids  Yes    Intervention  Provide education and support for participant on nutrition & aerobic/resistive exercise along with prescribed medications to achieve LDL 70mg , HDL >40mg .    Expected Outcomes  Short Term: Participant states understanding of desired cholesterol values and is compliant with medications prescribed. Participant is following exercise prescription and nutrition guidelines.;Long Term: Cholesterol controlled with medications as prescribed, with individualized exercise RX and with personalized nutrition plan. Value goals: LDL < 70mg , HDL > 40 mg.  Core Components/Risk Factors/Patient Goals Review:  Goals and Risk Factor Review    Row Name 08/11/17 1612 09/07/17 1149 09/07/17 1153 10/03/17 1333       Core Components/Risk  Factors/Patient Goals Review   Personal Goals Review  Weight Management/Obesity;Hypertension;Lipids  Weight Management/Obesity;Hypertension;Lipids  -  Weight Management/Obesity;Hypertension;Lipids    Review  Jovahn has had some intermittent exertional BP elevations with exercise. Patient asked to with exercise with workload expectations and not to overdue it  Quantrell is doing well with exercise. Recent blood pressures are better controlled.  Lyonel Morejon is doing well with exercise. Recent blood pressures are better controlled.    Expected Outcomes  Patient will take medicaiotns as presribed to reduce overall risk factors  Patient will take medicaiotns as presribed to reduce overall risk factors  Patient will take medications as presribed to reduce overall risk factors  Patient will take medications as presribed to reduce overall risk factors       Core Components/Risk Factors/Patient Goals at Discharge (Final Review):  Goals and Risk Factor Review - 10/03/17 1333      Core Components/Risk Factors/Patient Goals Review   Personal Goals Review  Weight Management/Obesity;Hypertension;Lipids    Review  Martez is doing well with exercise. Recent blood pressures are better controlled.    Expected Outcomes  Patient will take medications as presribed to reduce overall risk factors       ITP Comments: ITP Comments    Row Name 07/25/17 0912 08/08/17 1642 09/07/17 1149       ITP Comments  Dr. Fransico Him, Medical Director   30 day ITP review. Patient with good participation and attendance  30 day ITP review. Patient with good participation and attendance        Comments: See ITP comments.[name], RN,BSN 10/03/2017 1:36 PM

## 2017-10-04 ENCOUNTER — Encounter (HOSPITAL_COMMUNITY)
Admission: RE | Admit: 2017-10-04 | Discharge: 2017-10-04 | Disposition: A | Discharge status: Home / Self Care | Payer: Medicare Other | Source: Ambulatory Visit | Attending: Cardiovascular Disease | Admitting: Cardiovascular Disease

## 2017-10-04 ENCOUNTER — Other Ambulatory Visit: Payer: Self-pay | Admitting: Cardiovascular Disease

## 2017-10-04 ENCOUNTER — Encounter (HOSPITAL_COMMUNITY): Payer: Medicare Other

## 2017-10-04 DIAGNOSIS — Z952 Presence of prosthetic heart valve: Secondary | ICD-10-CM

## 2017-10-04 DIAGNOSIS — E785 Hyperlipidemia, unspecified: Secondary | ICD-10-CM | POA: Diagnosis not present

## 2017-10-04 DIAGNOSIS — K219 Gastro-esophageal reflux disease without esophagitis: Secondary | ICD-10-CM | POA: Diagnosis not present

## 2017-10-04 DIAGNOSIS — Z79899 Other long term (current) drug therapy: Secondary | ICD-10-CM | POA: Diagnosis not present

## 2017-10-04 DIAGNOSIS — I10 Essential (primary) hypertension: Secondary | ICD-10-CM | POA: Diagnosis not present

## 2017-10-04 DIAGNOSIS — Z7982 Long term (current) use of aspirin: Secondary | ICD-10-CM | POA: Diagnosis not present

## 2017-10-04 NOTE — Telephone Encounter (Signed)
Please review for refill, Thanks !  

## 2017-10-04 NOTE — Telephone Encounter (Signed)
Rx has been sent to the pharmacy electronically. ° °

## 2017-10-06 ENCOUNTER — Encounter (HOSPITAL_COMMUNITY)
Admission: RE | Admit: 2017-10-06 | Discharge: 2017-10-06 | Disposition: A | Discharge status: Home / Self Care | Payer: Medicare Other | Source: Ambulatory Visit | Attending: Cardiovascular Disease | Admitting: Cardiovascular Disease

## 2017-10-06 ENCOUNTER — Encounter (HOSPITAL_COMMUNITY): Payer: Medicare Other

## 2017-10-06 DIAGNOSIS — K219 Gastro-esophageal reflux disease without esophagitis: Secondary | ICD-10-CM | POA: Diagnosis not present

## 2017-10-06 DIAGNOSIS — E785 Hyperlipidemia, unspecified: Secondary | ICD-10-CM | POA: Diagnosis not present

## 2017-10-06 DIAGNOSIS — Z7982 Long term (current) use of aspirin: Secondary | ICD-10-CM | POA: Diagnosis not present

## 2017-10-06 DIAGNOSIS — Z952 Presence of prosthetic heart valve: Secondary | ICD-10-CM

## 2017-10-06 DIAGNOSIS — Z79899 Other long term (current) drug therapy: Secondary | ICD-10-CM | POA: Diagnosis not present

## 2017-10-06 DIAGNOSIS — I10 Essential (primary) hypertension: Secondary | ICD-10-CM | POA: Diagnosis not present

## 2017-10-09 ENCOUNTER — Encounter (HOSPITAL_COMMUNITY)
Admission: RE | Admit: 2017-10-09 | Discharge: 2017-10-09 | Disposition: A | Discharge status: Home / Self Care | Payer: Medicare Other | Source: Ambulatory Visit | Attending: Cardiovascular Disease | Admitting: Cardiovascular Disease

## 2017-10-09 ENCOUNTER — Encounter (HOSPITAL_COMMUNITY): Payer: Medicare Other

## 2017-10-09 ENCOUNTER — Telehealth: Payer: Self-pay | Admitting: Cardiology

## 2017-10-09 DIAGNOSIS — Z952 Presence of prosthetic heart valve: Secondary | ICD-10-CM

## 2017-10-09 DIAGNOSIS — E785 Hyperlipidemia, unspecified: Secondary | ICD-10-CM | POA: Diagnosis not present

## 2017-10-09 DIAGNOSIS — Z7982 Long term (current) use of aspirin: Secondary | ICD-10-CM | POA: Diagnosis not present

## 2017-10-09 DIAGNOSIS — Z79899 Other long term (current) drug therapy: Secondary | ICD-10-CM | POA: Diagnosis not present

## 2017-10-09 DIAGNOSIS — I10 Essential (primary) hypertension: Secondary | ICD-10-CM | POA: Diagnosis not present

## 2017-10-09 DIAGNOSIS — K219 Gastro-esophageal reflux disease without esophagitis: Secondary | ICD-10-CM | POA: Diagnosis not present

## 2017-10-09 NOTE — Telephone Encounter (Signed)
Called by RN in Cardiac Rehab. Pt was exercising and had a WCT- asymptomatic. He is now in NSR. I reviewed his strips. He has had LBBB in the past. Telemetry strips look like NSR with LBBB-probably rate related. No treatment.  Kerin Ransom PA-C 10/09/2017 10:51 AM

## 2017-10-09 NOTE — Progress Notes (Signed)
[name] Brown was working out harder than usual on the nustep when the patient's heart rate was noted in the 130's with a variation of his Regular ECG tracing. Blood pressure noted at 210/80. Exercise stopped. Oxygen saturation 97% on room air.  [name] Brown's recheck blood pressure was  160/90 then 130/70. [name] Brown [facility] paged and notified. [name] Brown [facility] paged and notified. [name] Brown [facility] came to outpatient cardiac rehab and reviewed the ECG tracings. No new order received. [name] Brown. Patient was given a copy of today's ECG tracings for Dr Oval Linsey to review. Exit heart rate 78 Sinus Rhythm. Blood pressure 130/70.[name], RN,BSN 10/09/2017 2:06 PM

## 2017-10-10 ENCOUNTER — Ambulatory Visit (INDEPENDENT_AMBULATORY_CARE_PROVIDER_SITE_OTHER): Payer: Medicare Other | Admitting: Cardiovascular Disease

## 2017-10-10 ENCOUNTER — Encounter: Payer: Self-pay | Admitting: Cardiovascular Disease

## 2017-10-10 VITALS — BP 136/84 | HR 67 | Ht 66.0 in | Wt 175.4 lb

## 2017-10-10 DIAGNOSIS — E7849 Other hyperlipidemia: Secondary | ICD-10-CM | POA: Diagnosis not present

## 2017-10-10 DIAGNOSIS — I480 Paroxysmal atrial fibrillation: Secondary | ICD-10-CM | POA: Diagnosis not present

## 2017-10-10 DIAGNOSIS — Z952 Presence of prosthetic heart valve: Secondary | ICD-10-CM | POA: Diagnosis not present

## 2017-10-10 DIAGNOSIS — I110 Hypertensive heart disease with heart failure: Secondary | ICD-10-CM

## 2017-10-10 DIAGNOSIS — I447 Left bundle-branch block, unspecified: Secondary | ICD-10-CM | POA: Diagnosis not present

## 2017-10-10 NOTE — Progress Notes (Signed)
Cardiology Office Note   Date:  10/10/2017   ID:  [name] Brown, DOB 11-30-1950, MRN [id number]  PCP:  [name], MD  Cardiologist:   Skeet Latch, MD   Chief Complaint  Patient presents with  . Follow-up     History of Present Illness: [name] Brown is a 67 y.o. male with asymptomatic CAD, chronic systolic and diastolic heart failure (LVEF 30-35% improved to 45-50%), moderate to severe aortic stenosis s/p bioprosthetic aortic valve,  and colon cancer s/p R colectomy who presents for follow up.  [name] Brown saw Dr. [name] on 09/23/16 and was noted to have coronary atherosclerosis on chest xray.  He had an echo 10/12/16 that revealed LVEF 30-35% with grade 1 diastolic dysfunction.  His aortic valve was heavily calcified and likely bicuspid.  Although the mean gradient was 14 mmHg, it was felt that he likely had moderate aortic stenosis given his reduced systolic function and leaflet restriction.   He was started on aspirin and referred to cardiology for evaluation.  He was referred for cardiac catheterization 10/2016 that revealed mild-mod CAD and LVEF 45-50%. [name] Brown was referred for a repeat echo 01/17/17 that revealed LVEF 30-35% with grade 1 diastolic dysfunction. There was fusion of the right and noncoronary cusps and leaflet mobility was severely restricted. The mean gradient was 16 mmHg.  He underwent AVR with a 76mm Edwards Magna-Ease pericardial valve 06/08/17.  His hospitalization was complicated by atrial fibrillation and he was started on amiodarone which was discontinued 3 months post-op.  [name] has been participating in cardiac rehab.  He feels great with exercise.  Yesterday he developed a rate-related BBB with peak exercise.  He notes that he was trying to beat his METS from the preceding day.  He has otherwise been feeling well.  He has no chest pain or shortness of breath.  He still has some discomfort at the incision site that is improving.  He denies  lower extremity edema, orthopnea, or PND.  He has no discomfort when participating in cardiac rehab.  He denies lower extremity edema, orthopnea, or PND.  He has not noted any palpitations, lightheadedness, or dizziness.  He had a repeat echo 09/08/2017 That revealed LVEF 45-50% with grade 1 diastolic dysfunction.  His bioprosthetic aortic valve was functioning well and had a mean gradient of 16 mmHg.   [name] Brown was treated for colon cancer with surgery only and did not require chemotherapy.  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    Past Medical History:  Diagnosis Date  . Aortic stenosis   . Colon cancer ([place])   . Family history of adverse reaction to anesthesia    pt states his mother had 2 episodes of hyponatremia following anesthesia   . GERD (gastroesophageal reflux disease)   . Headache   . Heart murmur   . Hyperlipidemia 10/25/2016  . Hypertension    pt is currently not taking any medications; pt states is borderline  . Melanoma ([place])   . PAF (paroxysmal atrial fibrillation) ([place]) 06/13/2017  . Perforated sigmoid colon ([place])   . PVC's (premature ventricular contractions) 10/25/2016  . Tinnitus   . Wears glasses     Past Surgical History:  Procedure Laterality Date  . AORTIC VALVE REPLACEMENT N/A 06/08/2017   Procedure: AORTIC VALVE REPLACEMENT (AVR);  Surgeon: [name], MD;  Location: [place];  Service: Open Heart Surgery;  Laterality: N/A;  Using  32mm Perimount Magna Ease Pericardial Bioprosthesis Aortic Valve  . COLOSTOMY CLOSURE N/A 08/18/2016   Procedure: COLOSTOMY CLOSURE OPEN PROCEDURE;  Surgeon: Armandina Gemma, MD;  Location: [facility];  Service: General;  Laterality: N/A;  . LAPAROTOMY N/A 04/25/2016   Procedure: EXPLORATORY LAPAROTOMY WITH SIGMOID COLECTOMY, COLOSTOMY;  Surgeon: Armandina Gemma, MD;  Location: [facility];  Service: General;  Laterality: N/A;  . LEFT HEART CATH AND CORONARY ANGIOGRAPHY N/A 11/03/2016   Procedure:  Left Heart Cath and Coronary Angiography;  Surgeon: Burnell Blanks, MD;  Location: [place] [facility];  Service: Cardiovascular;  Laterality: N/A;  . MELANOMA EXCISION    . PARTIAL COLECTOMY Right 08/18/2016   Procedure: RIGHT COLECTOMY;  Surgeon: Armandina Gemma, MD;  Location: [facility];  Service: General;  Laterality: Right;  . TEE WITHOUT CARDIOVERSION N/A 06/08/2017   Procedure: TRANSESOPHAGEAL ECHOCARDIOGRAM (TEE);  Surgeon: [name], MD;  Location: [place];  Service: Open Heart Surgery;  Laterality: N/A;     Current Outpatient Medications  Medication Sig Dispense Refill  . aspirin EC 325 MG EC tablet Take 1 tablet (325 mg total) by mouth daily. 30 tablet 0  . ezetimibe (ZETIA) 10 MG tablet Take 10 mg by mouth daily.    . famotidine (PEPCID) 20 MG tablet Take 20 mg by mouth daily as needed for heartburn or indigestion.    [name] losartan (COZAAR) 25 MG tablet TAKE ONE TABLET BY MOUTH DAILY 90 tablet 1  . MELATONIN PO Take 5 mg at bedtime as needed by mouth (sleep).     . metoprolol succinate (TOPROL XL) 25 MG 24 hr tablet Take 1 tablet (25 mg total) by mouth 2 (two) times daily. 60 tablet 3  . traZODone (DESYREL) 50 MG tablet Take 0.5-1 tablets (25-50 mg total) by mouth at bedtime as needed for sleep. 30 tablet 3   No current facility-administered medications for this visit.     Allergies:   Patient has no known allergies.    Social History:  The patient  reports that  has never smoked. he has never used smokeless tobacco. He reports that he drinks alcohol. He reports that he does not use drugs.   Family History:  The patient's family history includes Alcohol abuse in his father; CAD in his mother; Cirrhosis in his father; Dementia in his mother; Heart attack in his father; Kidney disease in his mother; Stroke in his maternal grandmother and paternal grandmother; Vascular Disease in his mother.    ROS:  Please see the history of present illness.   Otherwise, review of systems are  positive for none.   All other systems are reviewed and negative.    PHYSICAL EXAM: VS:  BP 136/84   Pulse 67   Ht 5\' 6"  (1.676 m)   Wt 175 lb 6.4 oz (79.6 kg)   BMI 28.31 kg/m   , BMI Body mass index is 28.31 kg/m. GENERAL:  Well appearing.  No acute distress HEENT: Pupils equal round and reactive, fundi not visualized, oral mucosa unremarkable NECK:  No jugular venous distention, waveform within normal limits, carotid upstroke brisk and symmetric, no bruits, no thyromegaly LUNGS:  Clear to auscultation bilaterally CHEST: Well-healed midline sternotomy incision. HEART:  RRR.  PMI not displaced or sustained,S1 and S2 within normal limits, no S3, no S4, no clicks, no rubs, II/VI systolic murmur at the left upper sternal border. ABD:  Flat, positive bowel sounds normal in frequency in pitch, no bruits, no rebound, no guarding, no midline pulsatile  mass, no hepatomegaly, no splenomegaly EXT:  2 plus pulses throughout, no edema, no cyanosis no clubbing SKIN:  No rashes no nodules NEURO:  Cranial nerves II through XII grossly intact, motor grossly intact throughout PSYCH:  Cognitively intact, oriented to person place and time   EKG:  EKG is ordered today. The ekg ordered 10/25/16 demonstrates sinus rhythm.  Ventricular bigeminy.  10/10/17: Sinus rhythm.  Rate 67 bpm.  Non-specific ST-T changes.   Echo 01/17/17: Study Conclusions  - Left ventricle: The cavity size was normal. Wall thickness was   normal. Systolic function was moderately to severely reduced. The   estimated ejection fraction was in the range of 30% to 35%.   Moderate diffuse hypokinesis with no identifiable regional   variations. Doppler parameters are consistent with abnormal left   ventricular relaxation (grade 1 diastolic dysfunction). - Ventricular septum: Septal motion showed paradox. These changes   are consistent with a left bundle branch block. - Aortic valve: Possibly bicuspid; severely thickened, severely    calcified leaflets; fusion of the right-noncoronary commissure.   Anterior cusp mobility was severely restricted. Transvalvular   velocity was increased, due to low cardiac output. There was   moderate to severe stenosis. There was mild regurgitation. - Right ventricle: Systolic function was mildly reduced.  Impressions:  - Suspect low gradient severe aortic stenosis.  Recommendations:  Consider dobutamine echo.  LHC 11/03/16:   Mid RCA to Dist RCA lesion, 10 %stenosed.  RPDA lesion, 10 %stenosed.  Ost Cx to Prox Cx lesion, 40 %stenosed.  Ost Ramus to Ramus lesion, 20 %stenosed.  Mid LAD lesion, 20 %stenosed.  Prox LAD lesion, 30 %stenosed.  The left ventricular ejection fraction is 45-50% by visual estimate.  There is mild left ventricular systolic dysfunction.  LV end diastolic pressure is normal.  There is no mitral valve regurgitation.   Recent Labs: 03/23/2017: TSH 4.40 06/09/2017: Magnesium 2.3 09/18/2017: ALT 33; BUN 16; Creatinine, Ser 1.23; Hemoglobin 14.1; Platelets 207.0; Potassium 4.7; Sodium 138    Lipid Panel    Component Value Date/Time   CHOL 177 09/18/2017 0920   CHOL 177 05/11/2017 1137   TRIG 154.0 (H) 09/18/2017 0920   HDL 60.00 09/18/2017 0920   HDL 59 05/11/2017 1137   CHOLHDL 3 09/18/2017 0920   VLDL 30.8 09/18/2017 0920   LDLCALC 86 09/18/2017 0920   LDLCALC 90 05/11/2017 1137      Wt Readings from Last 3 Encounters:  10/10/17 175 lb 6.4 oz (79.6 kg)  09/18/17 173 lb 2 oz (78.5 kg)  07/25/17 171 lb 8.3 oz (77.8 kg)      ASSESSMENT AND PLAN:  # Chronic systolic and diastolic heart failure:  Mr. Saha' ejection fraction improved after aortic valve replacement.  It was 45-50% on 08/2017.  He is euvolemic and doing well. Continue metoprolol and losartan.  # Rate related LBBB:  Mr. Mcconville developed sinus tachycardia with exercise and a rate related bundle branch block.  No further investigation needed.  He has no significant CAD  as noted on left heart catheterization prior to/2018.  # Non-obstructive CAD:  # Hyperlipidemia:  Continue aspirin and rosuvastatin 10 mg daily.  LDL 86 on 09/2017.  Will discuss at follow-up as his LDL should be less than 70.  # Mild-moderate AS:   # Bicuspid aortic valve s/p bioprosthetic AVR: Mr. Lastinger is doing well after aortic valve replacement.  Gradients were mildly elevated on his postoperative echo.  No aortic regurgitation.  We will reduce aspirin  to 81 mg daily.  He understands that he requires antibiotic prophylaxis prior to dental procedures.  # Hypertension:  Blood pressure has been labile ranging from the 100s to the 140s.  Given that his blood pressure is frequently low we will not make any changes at this time.  Continue metoprolol and losartan.  # PVCs:  Stable on metoprolol.    Current medicines are reviewed at length with the patient today.  The patient does not have concerns regarding medicines.  The following changes have been made:  None Labs/ tests ordered today include:   No orders of the defined types were placed in this encounter.    Disposition:   FU with Pia Jedlicka C. Oval Linsey, MD, [facility] in 3 months.    This note was written with the assistance of speech recognition software.  Please excuse any transcriptional errors.  Signed, Alexiss Iturralde C. Oval Linsey, MD, [facility]  10/10/2017 10:39 AM    [place]

## 2017-10-10 NOTE — Patient Instructions (Addendum)
Medication Instructions:  DECREASE YOUR ASPIRIN TO 81 MG DAILY   Labwork: NONE  Testing/Procedures: NONE  Follow-Up: Your physician recommends that you schedule a follow-up appointment in: 3 MONTH OV   If you need a refill on your cardiac medications before your next appointment, please call your pharmacy.

## 2017-10-11 ENCOUNTER — Encounter (HOSPITAL_COMMUNITY)
Admission: RE | Admit: 2017-10-11 | Discharge: 2017-10-11 | Disposition: A | Discharge status: Home / Self Care | Payer: Medicare Other | Source: Ambulatory Visit | Attending: Cardiovascular Disease | Admitting: Cardiovascular Disease

## 2017-10-11 ENCOUNTER — Encounter (HOSPITAL_COMMUNITY): Payer: Medicare Other

## 2017-10-11 ENCOUNTER — Encounter: Payer: Self-pay | Admitting: Cardiovascular Disease

## 2017-10-11 DIAGNOSIS — Z952 Presence of prosthetic heart valve: Secondary | ICD-10-CM | POA: Diagnosis not present

## 2017-10-11 DIAGNOSIS — I10 Essential (primary) hypertension: Secondary | ICD-10-CM | POA: Diagnosis not present

## 2017-10-11 DIAGNOSIS — Z79899 Other long term (current) drug therapy: Secondary | ICD-10-CM | POA: Diagnosis not present

## 2017-10-11 DIAGNOSIS — Z7982 Long term (current) use of aspirin: Secondary | ICD-10-CM | POA: Diagnosis not present

## 2017-10-11 DIAGNOSIS — K219 Gastro-esophageal reflux disease without esophagitis: Secondary | ICD-10-CM | POA: Diagnosis not present

## 2017-10-11 DIAGNOSIS — E785 Hyperlipidemia, unspecified: Secondary | ICD-10-CM | POA: Diagnosis not present

## 2017-10-12 ENCOUNTER — Encounter: Payer: Self-pay | Admitting: Cardiovascular Disease

## 2017-10-12 DIAGNOSIS — Z86010 Personal history of colonic polyps: Secondary | ICD-10-CM | POA: Diagnosis not present

## 2017-10-12 DIAGNOSIS — K648 Other hemorrhoids: Secondary | ICD-10-CM | POA: Diagnosis not present

## 2017-10-12 DIAGNOSIS — Z85038 Personal history of other malignant neoplasm of large intestine: Secondary | ICD-10-CM | POA: Diagnosis not present

## 2017-10-12 DIAGNOSIS — Z980 Intestinal bypass and anastomosis status: Secondary | ICD-10-CM | POA: Diagnosis not present

## 2017-10-13 ENCOUNTER — Encounter (HOSPITAL_COMMUNITY)
Admission: RE | Admit: 2017-10-13 | Discharge: 2017-10-13 | Disposition: A | Discharge status: Home / Self Care | Payer: Medicare Other | Source: Ambulatory Visit | Attending: Cardiovascular Disease | Admitting: Cardiovascular Disease

## 2017-10-13 ENCOUNTER — Encounter (HOSPITAL_COMMUNITY): Payer: Medicare Other

## 2017-10-13 DIAGNOSIS — I480 Paroxysmal atrial fibrillation: Secondary | ICD-10-CM | POA: Insufficient documentation

## 2017-10-13 DIAGNOSIS — I493 Ventricular premature depolarization: Secondary | ICD-10-CM | POA: Diagnosis not present

## 2017-10-13 DIAGNOSIS — K219 Gastro-esophageal reflux disease without esophagitis: Secondary | ICD-10-CM | POA: Diagnosis not present

## 2017-10-13 DIAGNOSIS — Z79899 Other long term (current) drug therapy: Secondary | ICD-10-CM | POA: Diagnosis not present

## 2017-10-13 DIAGNOSIS — E785 Hyperlipidemia, unspecified: Secondary | ICD-10-CM | POA: Insufficient documentation

## 2017-10-13 DIAGNOSIS — I10 Essential (primary) hypertension: Secondary | ICD-10-CM | POA: Diagnosis not present

## 2017-10-13 DIAGNOSIS — H5213 Myopia, bilateral: Secondary | ICD-10-CM | POA: Diagnosis not present

## 2017-10-13 DIAGNOSIS — Z952 Presence of prosthetic heart valve: Secondary | ICD-10-CM | POA: Diagnosis not present

## 2017-10-13 DIAGNOSIS — Z7982 Long term (current) use of aspirin: Secondary | ICD-10-CM | POA: Insufficient documentation

## 2017-10-13 DIAGNOSIS — H2513 Age-related nuclear cataract, bilateral: Secondary | ICD-10-CM | POA: Diagnosis not present

## 2017-10-13 NOTE — Addendum Note (Signed)
Addended by: Alvina Filbert B on: 10/13/2017 12:37 PM   Modules accepted: Orders

## 2017-10-16 ENCOUNTER — Encounter (HOSPITAL_COMMUNITY): Payer: Medicare Other

## 2017-10-16 ENCOUNTER — Encounter (HOSPITAL_COMMUNITY)
Admission: RE | Admit: 2017-10-16 | Discharge: 2017-10-16 | Disposition: A | Discharge status: Home / Self Care | Payer: Medicare Other | Source: Ambulatory Visit | Attending: Cardiovascular Disease | Admitting: Cardiovascular Disease

## 2017-10-16 DIAGNOSIS — I10 Essential (primary) hypertension: Secondary | ICD-10-CM | POA: Diagnosis not present

## 2017-10-16 DIAGNOSIS — Z79899 Other long term (current) drug therapy: Secondary | ICD-10-CM | POA: Diagnosis not present

## 2017-10-16 DIAGNOSIS — K219 Gastro-esophageal reflux disease without esophagitis: Secondary | ICD-10-CM | POA: Diagnosis not present

## 2017-10-16 DIAGNOSIS — Z952 Presence of prosthetic heart valve: Secondary | ICD-10-CM | POA: Diagnosis not present

## 2017-10-16 DIAGNOSIS — Z7982 Long term (current) use of aspirin: Secondary | ICD-10-CM | POA: Diagnosis not present

## 2017-10-16 DIAGNOSIS — E785 Hyperlipidemia, unspecified: Secondary | ICD-10-CM | POA: Diagnosis not present

## 2017-10-18 ENCOUNTER — Encounter (HOSPITAL_COMMUNITY): Payer: Medicare Other

## 2017-10-18 ENCOUNTER — Encounter (HOSPITAL_COMMUNITY)
Admission: RE | Admit: 2017-10-18 | Discharge: 2017-10-18 | Disposition: A | Discharge status: Home / Self Care | Payer: Medicare Other | Source: Ambulatory Visit | Attending: Cardiovascular Disease | Admitting: Cardiovascular Disease

## 2017-10-18 DIAGNOSIS — Z7982 Long term (current) use of aspirin: Secondary | ICD-10-CM | POA: Diagnosis not present

## 2017-10-18 DIAGNOSIS — Z79899 Other long term (current) drug therapy: Secondary | ICD-10-CM | POA: Diagnosis not present

## 2017-10-18 DIAGNOSIS — Z952 Presence of prosthetic heart valve: Secondary | ICD-10-CM | POA: Diagnosis not present

## 2017-10-18 DIAGNOSIS — K219 Gastro-esophageal reflux disease without esophagitis: Secondary | ICD-10-CM | POA: Diagnosis not present

## 2017-10-18 DIAGNOSIS — E785 Hyperlipidemia, unspecified: Secondary | ICD-10-CM | POA: Diagnosis not present

## 2017-10-18 DIAGNOSIS — I10 Essential (primary) hypertension: Secondary | ICD-10-CM | POA: Diagnosis not present

## 2017-10-20 ENCOUNTER — Encounter (HOSPITAL_COMMUNITY): Payer: Medicare Other

## 2017-10-20 ENCOUNTER — Encounter (HOSPITAL_COMMUNITY)
Admission: RE | Admit: 2017-10-20 | Discharge: 2017-10-20 | Disposition: A | Discharge status: Home / Self Care | Payer: Medicare Other | Source: Ambulatory Visit | Attending: Cardiovascular Disease | Admitting: Cardiovascular Disease

## 2017-10-20 DIAGNOSIS — Z79899 Other long term (current) drug therapy: Secondary | ICD-10-CM | POA: Diagnosis not present

## 2017-10-20 DIAGNOSIS — Z952 Presence of prosthetic heart valve: Secondary | ICD-10-CM | POA: Diagnosis not present

## 2017-10-20 DIAGNOSIS — E785 Hyperlipidemia, unspecified: Secondary | ICD-10-CM | POA: Diagnosis not present

## 2017-10-20 DIAGNOSIS — Z7982 Long term (current) use of aspirin: Secondary | ICD-10-CM | POA: Diagnosis not present

## 2017-10-20 DIAGNOSIS — I10 Essential (primary) hypertension: Secondary | ICD-10-CM | POA: Diagnosis not present

## 2017-10-20 DIAGNOSIS — K219 Gastro-esophageal reflux disease without esophagitis: Secondary | ICD-10-CM | POA: Diagnosis not present

## 2017-10-23 ENCOUNTER — Encounter (HOSPITAL_COMMUNITY)
Admission: RE | Admit: 2017-10-23 | Discharge: 2017-10-23 | Disposition: A | Discharge status: Home / Self Care | Payer: Medicare Other | Source: Ambulatory Visit | Attending: Cardiovascular Disease | Admitting: Cardiovascular Disease

## 2017-10-23 DIAGNOSIS — K219 Gastro-esophageal reflux disease without esophagitis: Secondary | ICD-10-CM | POA: Diagnosis not present

## 2017-10-23 DIAGNOSIS — Z952 Presence of prosthetic heart valve: Secondary | ICD-10-CM

## 2017-10-23 DIAGNOSIS — Z7982 Long term (current) use of aspirin: Secondary | ICD-10-CM | POA: Diagnosis not present

## 2017-10-23 DIAGNOSIS — I10 Essential (primary) hypertension: Secondary | ICD-10-CM | POA: Diagnosis not present

## 2017-10-23 DIAGNOSIS — E785 Hyperlipidemia, unspecified: Secondary | ICD-10-CM | POA: Diagnosis not present

## 2017-10-23 DIAGNOSIS — Z79899 Other long term (current) drug therapy: Secondary | ICD-10-CM | POA: Diagnosis not present

## 2017-10-25 ENCOUNTER — Encounter (HOSPITAL_COMMUNITY)
Admission: RE | Admit: 2017-10-25 | Discharge: 2017-10-25 | Disposition: A | Discharge status: Home / Self Care | Payer: Medicare Other | Source: Ambulatory Visit | Attending: Cardiovascular Disease | Admitting: Cardiovascular Disease

## 2017-10-25 DIAGNOSIS — E785 Hyperlipidemia, unspecified: Secondary | ICD-10-CM | POA: Diagnosis not present

## 2017-10-25 DIAGNOSIS — Z7982 Long term (current) use of aspirin: Secondary | ICD-10-CM | POA: Diagnosis not present

## 2017-10-25 DIAGNOSIS — K219 Gastro-esophageal reflux disease without esophagitis: Secondary | ICD-10-CM | POA: Diagnosis not present

## 2017-10-25 DIAGNOSIS — Z952 Presence of prosthetic heart valve: Secondary | ICD-10-CM | POA: Diagnosis not present

## 2017-10-25 DIAGNOSIS — Z79899 Other long term (current) drug therapy: Secondary | ICD-10-CM | POA: Diagnosis not present

## 2017-10-25 DIAGNOSIS — I10 Essential (primary) hypertension: Secondary | ICD-10-CM | POA: Diagnosis not present

## 2017-10-27 ENCOUNTER — Encounter (HOSPITAL_COMMUNITY)
Admission: RE | Admit: 2017-10-27 | Discharge: 2017-10-27 | Disposition: A | Discharge status: Home / Self Care | Payer: Medicare Other | Source: Ambulatory Visit | Attending: Cardiovascular Disease | Admitting: Cardiovascular Disease

## 2017-10-27 VITALS — BP 138/60 | HR 62 | Ht 66.25 in | Wt 174.4 lb

## 2017-10-27 DIAGNOSIS — Z79899 Other long term (current) drug therapy: Secondary | ICD-10-CM | POA: Diagnosis not present

## 2017-10-27 DIAGNOSIS — E785 Hyperlipidemia, unspecified: Secondary | ICD-10-CM | POA: Diagnosis not present

## 2017-10-27 DIAGNOSIS — Z952 Presence of prosthetic heart valve: Secondary | ICD-10-CM

## 2017-10-27 DIAGNOSIS — Z7982 Long term (current) use of aspirin: Secondary | ICD-10-CM | POA: Diagnosis not present

## 2017-10-27 DIAGNOSIS — I10 Essential (primary) hypertension: Secondary | ICD-10-CM | POA: Diagnosis not present

## 2017-10-27 DIAGNOSIS — K219 Gastro-esophageal reflux disease without esophagitis: Secondary | ICD-10-CM | POA: Diagnosis not present

## 2017-10-27 NOTE — Progress Notes (Signed)
Discharge Progress Report  Patient Details  Name: [name] Brown MRN: [id number] Date of Birth: [birth year] Referring Provider:     [facility] from 07/25/2017 in [place]  Referring Provider  Skeet Latch MD       Number of Visits: 36  Reason for Discharge:  Patient independent in their exercise.  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  ADL [facility]:   Initial Exercise Prescription: Initial Exercise Prescription - 07/25/17 1200      Date of Initial Exercise RX and Referring Provider   Date  07/25/17    Referring Provider  Skeet Latch MD      Treadmill   MPH  3    Grade  0    Minutes  10    METs  3.3      Bike   Level  0.8    Minutes  10    METs  2.9      NuStep   Level  3    SPM  80    Minutes  10    METs  2.5      Prescription Details   Frequency (times per week)  3    Duration  Progress to 30 minutes of continuous aerobic without signs/symptoms of physical distress      Intensity   THRR 40-80% of Max Heartrate  66-123    Ratings of Perceived Exertion  11-13    Perceived Dyspnea  0-4      Progression   Progression  Continue to progress workloads to maintain intensity without signs/symptoms of physical distress.      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    Duration  Continue with 45  min of aerobic exercise without signs/symptoms of physical distress.    Intensity  THRR unchanged      Progression   Progression  Continue to progress workloads to maintain intensity without signs/symptoms of physical distress.    Average METs  5.4      Resistance Training   Training Prescription  Yes    Weight  8lbs    Reps  10-15    Time  10 Minutes      Interval Training   Interval Training  No      Treadmill   MPH  3.3    Grade  5    Minutes  10    METs  5.8      Bike   Level  1.9    Minutes  10    METs  5.53      NuStep   Level  5    SPM  100    Minutes  10  METs  4.9      Home Exercise Plan   Plans to continue exercise at  [facility] (comment)    Frequency  Add 3 additional days to program exercise sessions.    Initial Home Exercises Provided  08/09/17       Functional Capacity: 6 Minute Walk    Row Name 07/25/17 1208 10/16/17 1001       6 Minute Walk   Phase  Initial  Discharge    Distance  1728 feet  2400 feet    Distance % Change  -  38.89 %    Walk Time  6 minutes  6 minutes    # of Rest Breaks  0  0    MPH  3.3  4.55    METS  3.78  5.46    RPE  11  12    VO2 Peak  13.23  19.09    Symptoms  No  No    Resting HR  62 bpm  63 bpm    Resting BP  128/80  114/74    Resting Oxygen Saturation   97 %  -    Exercise Oxygen Saturation  during 6 min walk  98 %  -    Max Ex. HR  95 bpm  107 bpm    Max Ex. BP  128/70  188/84    2 Minute Post BP  114/72  158/86       Psychological, QOL, Others - Outcomes: PHQ 2/9: Depression screen [facility] 2/9 10/27/2017 09/18/2017 07/31/2017 03/23/2017 03/23/2017  Decreased Interest 0 0 0 0 0  Down, Depressed, Hopeless 0 0 0 0 0  PHQ - 2 Score 0 0 0 0 0  Altered sleeping - 0 - - 0  Tired, decreased energy - 0 - - 0  Change in appetite - 0 - - 0  Feeling bad or failure about yourself  - 0 - - 0  Trouble concentrating - 0 - - 0  Moving slowly or fidgety/restless - 0 - - 0  Suicidal thoughts - 0 - - 0  PHQ-9  Score - 0 - - 0  Difficult doing work/chores - Not difficult at all - - -    Quality of Life: Quality of Life - 10/27/17 1055      Quality of Life Scores   Health/Function Pre  24.97 %    Health/Function Post  25.63 %    Health/Function % Change  2.64 %    Socioeconomic Pre  26.06 %    Socioeconomic Post  23.29 %    Socioeconomic % Change   -10.63 %    Psych/Spiritual Pre  21.79 %    Psych/Spiritual Post  23.5 %    Psych/Spiritual % Change  7.85 %    Family Pre  24.6 %    Family Post  22.38 %    Family % Change  -9.02 %    GLOBAL Pre  24.53 %    GLOBAL Post  24.31 %    GLOBAL % Change  -0.9 %       Personal Goals: Goals established at orientation with interventions provided to work toward goal. Personal Goals and Risk Factors at Admission - 07/25/17 1216      Core Components/Risk Factors/Patient Goals on Admission    Weight Management  Yes;Weight Loss    Intervention  Weight Management: Develop a combined nutrition and exercise program designed to reach desired caloric intake, while maintaining appropriate intake of nutrient  and fiber, sodium and fats, and appropriate energy expenditure required for the weight goal.;Weight Management: Provide education and appropriate resources to help participant work on and attain dietary goals.;Weight Management/Obesity: Establish reasonable short term and long term weight goals.    Admit Weight  171 lb 8.3 oz (77.8 kg)    Goal Weight: Short Term  165 lb (74.8 kg)    Goal Weight: Long Term  159 lb (72.1 kg)    Expected Outcomes  Short Term: Continue to assess and modify interventions until short term weight is achieved;Long Term: Adherence to nutrition and physical activity/exercise program aimed toward attainment of established weight goal;Weight Maintenance: Understanding of the daily nutrition guidelines, which includes 25-35% calories from fat, 7% or less cal from saturated fats, less than 228m cholesterol, less than 1.5gm of sodium, & 5  or more servings of fruits and vegetables daily;Weight Loss: Understanding of general recommendations for a balanced deficit meal plan, which promotes 1-2 lb weight loss per week and includes a negative energy balance of [phone] kcal/d;Understanding recommendations for meals to include 15-35% energy as protein, 25-35% energy from fat, 35-60% energy from carbohydrates, less than 2028mof dietary cholesterol, 20-35 gm of total fiber daily;Understanding of distribution of calorie intake throughout the day with the consumption of 4-5 meals/snacks    Hypertension  Yes    Intervention  Monitor prescription use compliance.;Provide education on lifestyle modifcations including regular physical activity/exercise, weight management, moderate sodium restriction and increased consumption of fresh fruit, vegetables, and low fat dairy, alcohol moderation, and smoking cessation.    Expected Outcomes  Long Term: Maintenance of blood pressure at goal levels.;Short Term: Continued assessment and intervention until BP is < 140/9049mG in hypertensive participants. < 130/78m43m in hypertensive participants with diabetes, heart failure or chronic kidney disease.    Lipids  Yes    Intervention  Provide education and support for participant on nutrition & aerobic/resistive exercise along with prescribed medications to achieve LDL <70mg60mL >40mg.60mExpected Outcomes  Short Term: Participant states understanding of desired cholesterol values and is compliant with medications prescribed. Participant is following exercise prescription and nutrition guidelines.;Long Term: Cholesterol controlled with medications as prescribed, with individualized exercise RX and with personalized nutrition plan. Value goals: LDL < 70mg, 52m> 40 mg.        Personal Goals Discharge: Goals and Risk Factor Review    Row Name 08/11/17 1612 09/07/17 1149 09/07/17 1153 10/03/17 1333       Core Components/Risk Factors/Patient Goals Review    Personal Goals Review  Weight Management/Obesity;Hypertension;Lipids  Weight Management/Obesity;Hypertension;Lipids  -  Weight Management/Obesity;Hypertension;Lipids    Review  Dalon hKavind some intermittent exertional BP elevations with exercise. Patient asked to with exercise with workload expectations and not to overdue it  Shae iEphramng well with exercise. Recent blood pressures are better controlled.  -  DaviMaor Meckelng well with exercise. Recent blood pressures are better controlled.    Expected Outcomes  Patient will take medicaiotns as presribed to reduce overall risk factors  Patient will take medicaiotns as presribed to reduce overall risk factors  Patient will take medications as presribed to reduce overall risk factors  Patient will take medications as presribed to reduce overall risk factors       Exercise Goals and Review: Exercise Goals    Row Name 07/25/17 1215             Exercise Goals   Increase Physical Activity  Yes  Intervention  Provide advice, education, support and counseling about physical activity/exercise needs.;Develop an individualized exercise prescription for aerobic and resistive training based on initial evaluation findings, risk stratification, comorbidities and participant's personal goals.       Expected Outcomes  Achievement of increased cardiorespiratory fitness and enhanced flexibility, muscular endurance and strength shown through measurements of functional capacity and personal statement of participant.       Increase Strength and Stamina  Yes increase UE strength       Intervention  Provide advice, education, support and counseling about physical activity/exercise needs.;Develop an individualized exercise prescription for aerobic and resistive training based on initial evaluation findings, risk stratification, comorbidities and participant's personal goals.       Expected Outcomes  Achievement of increased cardiorespiratory fitness and enhanced  flexibility, muscular endurance and strength shown through measurements of functional capacity and personal statement of participant.       Able to understand and use rate of perceived exertion (RPE) scale  Yes       Intervention  Provide education and explanation on how to use RPE scale       Expected Outcomes  Short Term: Able to use RPE daily in rehab to express subjective intensity level;Long Term:  Able to use RPE to guide intensity level when exercising independently       Knowledge and understanding of Target Heart Rate Range (THRR)  Yes       Intervention  Provide education and explanation of THRR including how the numbers were predicted and where they are located for reference       Expected Outcomes  Short Term: Able to state/look up THRR;Long Term: Able to use THRR to govern intensity when exercising independently;Short Term: Able to use daily as guideline for intensity in rehab       Able to check pulse independently  Yes       Intervention  Provide education and demonstration on how to check pulse in carotid and radial arteries.;Review the importance of being able to check your own pulse for safety during independent exercise       Expected Outcomes  Short Term: Able to explain why pulse checking is important during independent exercise;Long Term: Able to check pulse independently and accurately       Understanding of Exercise Prescription  Yes       Intervention  Provide education, explanation, and written materials on patient's individual exercise prescription       Expected Outcomes  Short Term: Able to explain program exercise prescription;Long Term: Able to explain home exercise prescription to exercise independently          Nutrition & Weight - Outcomes: Pre Biometrics - 07/25/17 1515      Pre Biometrics   Height  5' 6.25" (1.683 m)    Weight  171 lb 8.3 oz (77.8 kg)    Waist Circumference  37 inches    Hip Circumference  39.5 inches    Waist to Hip Ratio  0.94 %    BMI  (Calculated)  27.47    Triceps Skinfold  22 mm    % Body Fat  27.8 %    Grip Strength  49 kg    Flexibility  12 in    Single Leg Stand  30 seconds      Post Biometrics - 10/27/17 1055       Post  Biometrics   Height  5' 6.25" (1.683 m)    Weight  174 lb 6.1  oz (79.1 kg)    Waist Circumference  37.5 inches    Hip Circumference  40.75 inches    Waist to Hip Ratio  0.92 %    BMI (Calculated)  27.93    Triceps Skinfold  23 mm    % Body Fat  28.4 %    Grip Strength  55.5 kg    Flexibility  -- Post measurement not taken.    Single Leg Stand  30 seconds       Nutrition: Nutrition Therapy & Goals - 07/25/17 1058      Nutrition Therapy   Diet  General, healthful      Personal Nutrition Goals   Nutrition Goal  Pt to identify food quantities necessary to achieve weight loss of 6-12 lb at graduation from cardiac rehab. Goal wt of 153 lb desired.       Intervention Plan   Intervention  Prescribe, educate and counsel regarding individualized specific dietary modifications aiming towards targeted core components such as weight, hypertension, lipid management, diabetes, heart failure and other comorbidities.    Expected Outcomes  Short Term Goal: Understand basic principles of dietary content, such as calories, fat, sodium, cholesterol and nutrients.;Long Term Goal: Adherence to prescribed nutrition plan.       Nutrition Discharge: Nutrition Assessments - 10/23/17 1015      MEDFICTS Scores   Pre Score  40    Post Score  18    Score Difference  -22       Education Questionnaire Score: Knowledge Questionnaire Score - 10/27/17 1055      Knowledge Questionnaire Score   Pre Score  23/24    Post Score  24/24       Goals reviewed with patient; copy given to patient. Donald graduated from cardiac rehab program today with completion of 36 exercise sessions in Phase II. Pt maintained good attendance and progressed nicely during his participation in rehab as evidenced by increased MET  level.   Medication list reconciled. Repeat  PHQ score- 0 .  Pt has made significant lifestyle changes and should be commended for his success. Pt feels he has achieved his goals during cardiac rehab.   Pt plans to continue exercise at the [facility]. Eoin increased his distance on his post exercise walk test. We are proud of Demaris's progress.[name], RN,BSN 11/10/2017 9:00 AM

## 2017-10-31 ENCOUNTER — Other Ambulatory Visit: Payer: Self-pay | Admitting: Surgery

## 2017-10-31 DIAGNOSIS — I10 Essential (primary) hypertension: Secondary | ICD-10-CM

## 2017-11-16 ENCOUNTER — Other Ambulatory Visit: Payer: Self-pay | Admitting: Surgery

## 2017-11-16 DIAGNOSIS — I10 Essential (primary) hypertension: Secondary | ICD-10-CM

## 2017-11-18 ENCOUNTER — Other Ambulatory Visit: Payer: Self-pay | Admitting: Surgery

## 2017-11-18 DIAGNOSIS — I10 Essential (primary) hypertension: Secondary | ICD-10-CM

## 2017-11-20 ENCOUNTER — Other Ambulatory Visit: Payer: Self-pay | Admitting: Surgery

## 2017-11-20 ENCOUNTER — Other Ambulatory Visit: Payer: Self-pay | Admitting: *Deleted

## 2017-11-20 DIAGNOSIS — I10 Essential (primary) hypertension: Secondary | ICD-10-CM

## 2017-11-20 MED ORDER — METOPROLOL SUCCINATE ER 25 MG PO TB24
25.0000 mg | ORAL_TABLET | Freq: Two times a day (BID) | ORAL | 6 refills | Status: DC
Start: 2017-11-20 — End: 2018-04-30

## 2017-12-04 DIAGNOSIS — D2262 Melanocytic nevi of left upper limb, including shoulder: Secondary | ICD-10-CM | POA: Diagnosis not present

## 2017-12-04 DIAGNOSIS — D2272 Melanocytic nevi of left lower limb, including hip: Secondary | ICD-10-CM | POA: Diagnosis not present

## 2017-12-04 DIAGNOSIS — Z85820 Personal history of malignant melanoma of skin: Secondary | ICD-10-CM | POA: Diagnosis not present

## 2017-12-04 DIAGNOSIS — L812 Freckles: Secondary | ICD-10-CM | POA: Diagnosis not present

## 2017-12-04 DIAGNOSIS — D225 Melanocytic nevi of trunk: Secondary | ICD-10-CM | POA: Diagnosis not present

## 2017-12-04 DIAGNOSIS — D2271 Melanocytic nevi of right lower limb, including hip: Secondary | ICD-10-CM | POA: Diagnosis not present

## 2017-12-04 DIAGNOSIS — L821 Other seborrheic keratosis: Secondary | ICD-10-CM | POA: Diagnosis not present

## 2017-12-04 DIAGNOSIS — L738 Other specified follicular disorders: Secondary | ICD-10-CM | POA: Diagnosis not present

## 2017-12-04 DIAGNOSIS — D2261 Melanocytic nevi of right upper limb, including shoulder: Secondary | ICD-10-CM | POA: Diagnosis not present

## 2018-01-09 ENCOUNTER — Encounter: Payer: Self-pay | Admitting: Cardiovascular Disease

## 2018-01-09 ENCOUNTER — Ambulatory Visit (INDEPENDENT_AMBULATORY_CARE_PROVIDER_SITE_OTHER): Payer: Medicare Other | Admitting: Cardiovascular Disease

## 2018-01-09 VITALS — BP 130/86 | HR 63 | Ht 66.0 in | Wt 171.0 lb

## 2018-01-09 DIAGNOSIS — Z952 Presence of prosthetic heart valve: Secondary | ICD-10-CM | POA: Diagnosis not present

## 2018-01-09 DIAGNOSIS — E7849 Other hyperlipidemia: Secondary | ICD-10-CM | POA: Diagnosis not present

## 2018-01-09 DIAGNOSIS — R37 Sexual dysfunction, unspecified: Secondary | ICD-10-CM | POA: Diagnosis not present

## 2018-01-09 DIAGNOSIS — I5042 Chronic combined systolic (congestive) and diastolic (congestive) heart failure: Secondary | ICD-10-CM

## 2018-01-09 DIAGNOSIS — I10 Essential (primary) hypertension: Secondary | ICD-10-CM | POA: Diagnosis not present

## 2018-01-09 DIAGNOSIS — Z5181 Encounter for therapeutic drug level monitoring: Secondary | ICD-10-CM | POA: Diagnosis not present

## 2018-01-09 MED ORDER — SILDENAFIL CITRATE 50 MG PO TABS: 50.0000 mg | ORAL_TABLET | Freq: Every day | ORAL | 0 refills | Status: DC | PRN

## 2018-01-09 MED ORDER — PRAVASTATIN SODIUM 10 MG PO TABS: 10.0000 mg | ORAL_TABLET | Freq: Every day | ORAL | 1 refills | Status: DC

## 2018-01-09 NOTE — Progress Notes (Signed)
Cardiology Office Note   Date:  01/09/2018   ID:  [name] Brown, DOB 06-Oct-1950, MRN [id number]  PCP:  [name], MD  Cardiologist:   Skeet Latch, MD   No chief complaint on file.    History of Present Illness: [name] Brown is a 67 y.o. male with non-obstructive CAD, chronic systolic and diastolic heart failure (LVEF 30-35% improved to 45-50%), aortic stenosis s/p bioprosthetic aortic valve,  and colon cancer s/p R colectomy who presents for follow up.  [name] Brown saw Dr. [name] on 09/23/16 and was noted to have coronary atherosclerosis on chest xray.  He had an echo 10/12/16 that revealed LVEF 30-35% with grade 1 diastolic dysfunction.  His aortic valve was heavily calcified and likely bicuspid.  Although the mean gradient was 14 mmHg, it was felt that he likely had moderate aortic stenosis given his reduced systolic function and leaflet restriction.   He was started on aspirin and referred to cardiology for evaluation.  He was referred for cardiac catheterization 10/2016 that revealed mild-mod CAD and LVEF 45-50%. [name] Brown was referred for a repeat echo 01/17/17 that revealed LVEF 30-35% with grade 1 diastolic dysfunction. There was fusion of the right and noncoronary cusps and leaflet mobility was severely restricted. The mean gradient was 16 mmHg.  He underwent AVR with a 43mm Edwards Magna-Ease pericardial valve 06/08/17.  His hospitalization was complicated by atrial fibrillation and he was started on amiodarone which was discontinued 3 months post-op.  Since his last appointment [name] Brown continues to do well.  He goes to the gym 5 days/week.  He does 45 minutes of aerobic activity followed by weights.  He has no chest pain or shortness of breath with this activity.  He has not noted any lower extremity edema, orthopnea, or PND.  He occasionally feels lightheaded.  Last for a few moments at a time.  It is not associated with positional changes.  He denies presyncope.   Reports some sexual dysfunction and request Viagra.  [name] Brown was treated for colon cancer with surgery only and did not require chemotherapy.  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    Past Medical History:  Diagnosis Date  . Aortic stenosis   . Colon cancer ([place])   . Family history of adverse reaction to anesthesia    pt states his mother had 2 episodes of hyponatremia following anesthesia   . GERD (gastroesophageal reflux disease)   . Headache   . Heart murmur   . Hyperlipidemia 10/25/2016  . Hypertension    pt is currently not taking any medications; pt states is borderline  . Melanoma ([place])   . PAF (paroxysmal atrial fibrillation) ([place]) 06/13/2017  . Perforated sigmoid colon ([place])   . PVC's (premature ventricular contractions) 10/25/2016  . Tinnitus   . Wears glasses     Past Surgical History:  Procedure Laterality Date  . AORTIC VALVE REPLACEMENT N/A 06/08/2017   Procedure: AORTIC VALVE REPLACEMENT (AVR);  Surgeon: [name], MD;  Location: [facility];  Service: Open Heart Surgery;  Laterality: N/A;  Using 59mm Perimount Magna Ease Pericardial Bioprosthesis Aortic Valve  . COLOSTOMY CLOSURE N/A 08/18/2016   Procedure: COLOSTOMY CLOSURE OPEN PROCEDURE;  Surgeon: Armandina Gemma, MD;  Location: [facility];  Service: General;  Laterality: N/A;  . LAPAROTOMY N/A 04/25/2016   Procedure: EXPLORATORY LAPAROTOMY WITH SIGMOID COLECTOMY, COLOSTOMY;  Surgeon: Armandina Gemma, MD;  Location: [facility];  Service: General;  Laterality: N/A;  . LEFT HEART CATH AND CORONARY ANGIOGRAPHY N/A 11/03/2016   Procedure: Left Heart Cath and Coronary Angiography;  Surgeon: Burnell Blanks, MD;  Location: [facility];  Service: Cardiovascular;  Laterality: N/A;  . MELANOMA EXCISION    . PARTIAL COLECTOMY Right 08/18/2016   Procedure: RIGHT COLECTOMY;  Surgeon: Armandina Gemma, MD;  Location: [facility];  Service: General;  Laterality: Right;  . TEE WITHOUT  CARDIOVERSION N/A 06/08/2017   Procedure: TRANSESOPHAGEAL ECHOCARDIOGRAM (TEE);  Surgeon: [name], MD;  Location: [facility];  Service: Open Heart Surgery;  Laterality: N/A;     Current Outpatient Medications  Medication Sig Dispense Refill  . aspirin EC 81 MG tablet Take 81 mg by mouth daily.    [name] ezetimibe (ZETIA) 10 MG tablet Take 10 mg by mouth daily.    . famotidine (PEPCID) 20 MG tablet Take 20 mg by mouth daily as needed for heartburn or indigestion.    [name] losartan (COZAAR) 25 MG tablet TAKE ONE TABLET BY MOUTH DAILY 90 tablet 1  . metoprolol succinate (TOPROL XL) 25 MG 24 hr tablet Take 1 tablet (25 mg total) by mouth 2 (two) times daily. 60 tablet 6  . traZODone (DESYREL) 50 MG tablet Take 0.5-1 tablets (25-50 mg total) by mouth at bedtime as needed for sleep. 30 tablet 3  . pravastatin (PRAVACHOL) 10 MG tablet Take 1 tablet (10 mg total) by mouth daily. 90 tablet 1  . sildenafil (VIAGRA) 50 MG tablet Take 1 tablet (50 mg total) by mouth daily as needed. 10 tablet 0   No current facility-administered medications for this visit.     Allergies:   Patient has no known allergies.    Social History:  The patient  reports that he has never smoked. He has never used smokeless tobacco. He reports that he drinks alcohol. He reports that he does not use drugs.   Family History:  The patient's family history includes Alcohol abuse in his father; CAD in his mother; Cirrhosis in his father; Dementia in his mother; Heart attack in his father; Kidney disease in his mother; Stroke in his maternal grandmother and paternal grandmother; Vascular Disease in his mother.    ROS:  Please see the history of present illness.   Otherwise, review of systems are positive for none.   All other systems are reviewed and negative.    PHYSICAL EXAM: VS:  BP 130/86   Pulse 63   Ht 5\' 6"  (1.676 m)   Wt 171 lb (77.6 kg)   SpO2 99%   BMI 27.60 kg/m  , BMI Body mass index is 27.6 kg/m. GENERAL:  Well  appearing HEENT: Pupils equal round and reactive, fundi not visualized, oral mucosa unremarkable NECK:  No jugular venous distention, waveform within normal limits, carotid upstroke brisk and symmetric, no bruits, no thyromegaly LYMPHATICS:  No cervical adenopathy LUNGS:  Clear to auscultation bilaterally HEART:  RRR.  PMI not displaced or sustained,S1 and S2 within normal limits, no S3, no S4, no clicks, no rubs, II/VI systolic murmur at the LUSB ABD:  Flat, positive bowel sounds normal in frequency in pitch, no bruits, no rebound, no guarding, no midline pulsatile mass, no hepatomegaly, no splenomegaly EXT:  2 plus pulses throughout, no edema, no cyanosis no clubbing SKIN:  No rashes no nodules NEURO:  Cranial nerves II through XII grossly intact, motor grossly intact throughout PSYCH:  Cognitively intact, oriented to person place and time  EKG:  EKG is not ordered today. The ekg ordered 10/25/16 demonstrates sinus rhythm.  Ventricular bigeminy.  10/10/17: Sinus rhythm.  Rate 67 bpm.  Non-specific ST-T changes.   Echo 01/17/17: Study Conclusions  - Left ventricle: The cavity size was normal. Wall thickness was   normal. Systolic function was moderately to severely reduced. The   estimated ejection fraction was in the range of 30% to 35%.   Moderate diffuse hypokinesis with no identifiable regional   variations. Doppler parameters are consistent with abnormal left   ventricular relaxation (grade 1 diastolic dysfunction). - Ventricular septum: Septal motion showed paradox. These changes   are consistent with a left bundle branch block. - Aortic valve: Possibly bicuspid; severely thickened, severely   calcified leaflets; fusion of the right-noncoronary commissure.   Anterior cusp mobility was severely restricted. Transvalvular   velocity was increased, due to low cardiac output. There was   moderate to severe stenosis. There was mild regurgitation. - Right ventricle: Systolic function was  mildly reduced.  Impressions:  - Suspect low gradient severe aortic stenosis.  Recommendations:  Consider dobutamine echo.  LHC 11/03/16:   Mid RCA to Dist RCA lesion, 10 %stenosed.  RPDA lesion, 10 %stenosed.  Ost Cx to Prox Cx lesion, 40 %stenosed.  Ost Ramus to Ramus lesion, 20 %stenosed.  Mid LAD lesion, 20 %stenosed.  Prox LAD lesion, 30 %stenosed.  The left ventricular ejection fraction is 45-50% by visual estimate.  There is mild left ventricular systolic dysfunction.  LV end diastolic pressure is normal.  There is no mitral valve regurgitation.   Recent Labs: 03/23/2017: TSH 4.40 06/09/2017: Magnesium 2.3 09/18/2017: ALT 33; BUN 16; Creatinine, Ser 1.23; Hemoglobin 14.1; Platelets 207.0; Potassium 4.7; Sodium 138    Lipid Panel    Component Value Date/Time   CHOL 177 09/18/2017 0920   CHOL 177 05/11/2017 1137   TRIG 154.0 (H) 09/18/2017 0920   HDL 60.00 09/18/2017 0920   HDL 59 05/11/2017 1137   CHOLHDL 3 09/18/2017 0920   VLDL 30.8 09/18/2017 0920   LDLCALC 86 09/18/2017 0920   LDLCALC 90 05/11/2017 1137      Wt Readings from Last 3 Encounters:  01/09/18 171 lb (77.6 kg)  10/27/17 174 lb 6.1 oz (79.1 kg)  10/10/17 175 lb 6.4 oz (79.6 kg)      ASSESSMENT AND PLAN:  # Chronic systolic and diastolic heart failure:  [name] Brown' ejection fraction improved after aortic valve replacement.  It was 45-50% on 08/2017.  He is euvolemic and doing well. Continue metoprolol and losartan.   # Rate related LBBB:  [name] Brown developed sinus tachycardia with exercise and a rate related bundle branch block.  No further investigation needed.  He has no significant CAD as noted on left heart catheterization prior to/2018.  # Non-obstructive CAD:  # Hyperlipidemia:  Continue aspirin and  Ezetimibe.  He had elevated LFTs with rosuvastatin.  He is willing to try pravastatin 10 mg.  Repeat lipids and CMP in 6 weeks.  Continue aspirin.  # Mild-moderate AS:   #  Bicuspid aortic valve s/p bioprosthetic AVR: Mr. Bloyd is doing well after aortic valve replacement.  Gradients were mildly elevated on his postoperative echo.  No aortic regurgitation.  Repeat echo 08/2018.  # Hypertension:  Blood pressure Controlled on metoprolol and losartan.   # PVCs:  Stable on metoprolol.    Current medicines are reviewed at length with the patient today.  The patient does not have concerns regarding medicines.  The following  changes have been made:  None Labs/ tests ordered today include:   Orders Placed This Encounter  Procedures  . Lipid panel  . Comprehensive metabolic panel  . ECHOCARDIOGRAM COMPLETE    Disposition:   FU with Melisssa Donner C. Oval Linsey, MD, [facility] in December after echo.     Signed, Alecia Doi C. Oval Linsey, MD, [facility]  01/09/2018 10:57 AM    [place]

## 2018-01-09 NOTE — Patient Instructions (Signed)
Medication Instructions:  START PRAVASTATIN 10 MG DAILY   VIAGRA 50 MG AS NEEDED   Labwork: FASTING LP/CMET IN 6 WEEKS   Testing/Procedures: Your physician has requested that you have an echocardiogram. Echocardiography is a painless test that uses sound waves to create images of your heart. It provides your doctor with information about the size and shape of your heart and how well your heart's chambers and valves are working. This procedure takes approximately one hour. There are no restrictions for this procedure. IN December  [facility] AT [address]  Follow-Up: Your physician wants you to follow-up in: AFTER ECHO IN December  You will receive a reminder letter in the mail two months in advance. If you don't receive a letter, please call our office to schedule the follow-up appointment.  If you need a refill on your cardiac medications before your next appointment, please call your pharmacy.

## 2018-01-09 NOTE — Progress Notes (Signed)
V

## 2018-01-12 IMAGING — CT CT ABD-PELV W/ CM
2 of 5 series · 15 of 46 positions shown, 17 images · IV contrast (ISOVUE)
Comparison: None.

CLINICAL DATA: Generalized abdominal pain for 1 day. Constipation.
Severe pain after large bowel movement yesterday and severe pain
while attempting to have bowel movement today.

EXAM:
CT ABDOMEN AND PELVIS WITH CONTRAST
TECHNIQUE: Multidetector CT imaging of the abdomen and pelvis was performed
using the standard protocol following bolus administration of
intravenous contrast.
CONTRAST:  100mL [id number] IOPAMIDOL ([id number]) INJECTION 61%

[Series 2: abd/pel with · axial · 0.79mm/px · z∈[-215,+205]mm · 12 of 97 slices shown, 14 images]
[im 7/97  soft-tissue]
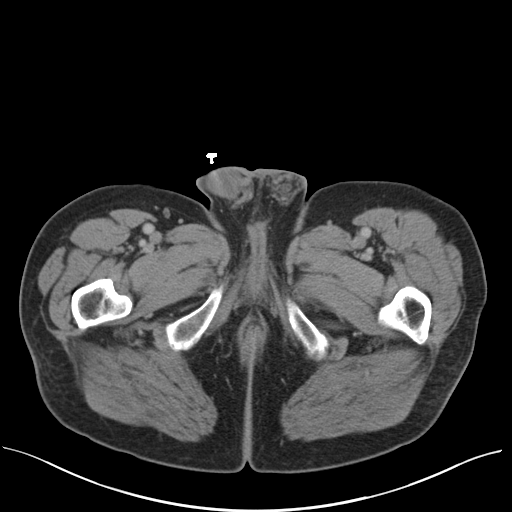
[im 7/97  bone]
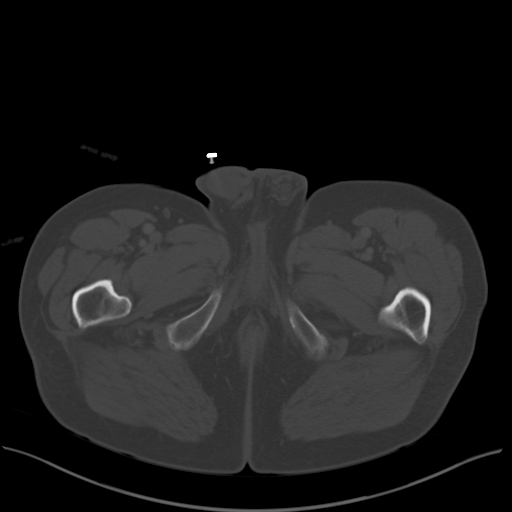
[im 13/97  soft-tissue]
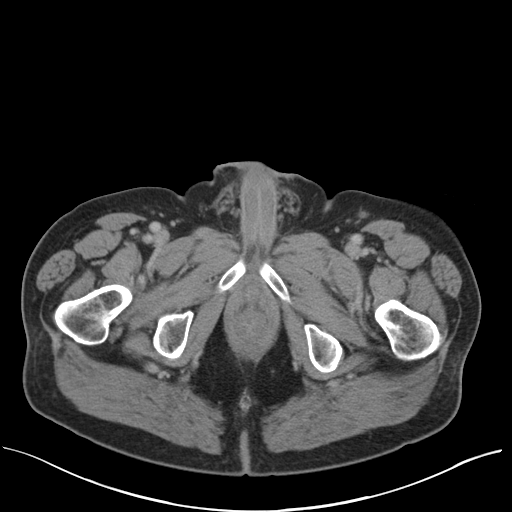
[im 25/97  soft-tissue]
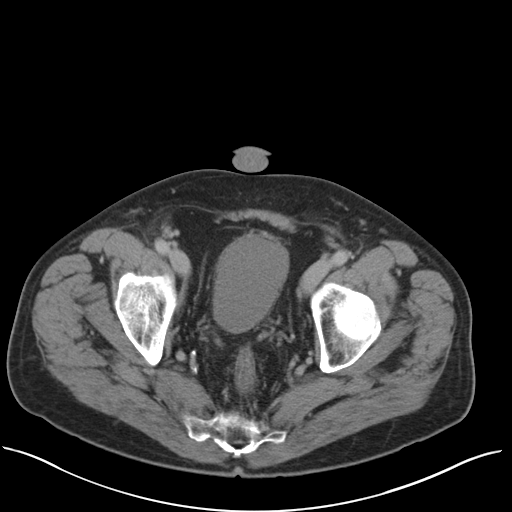
[im 31/97  soft-tissue]
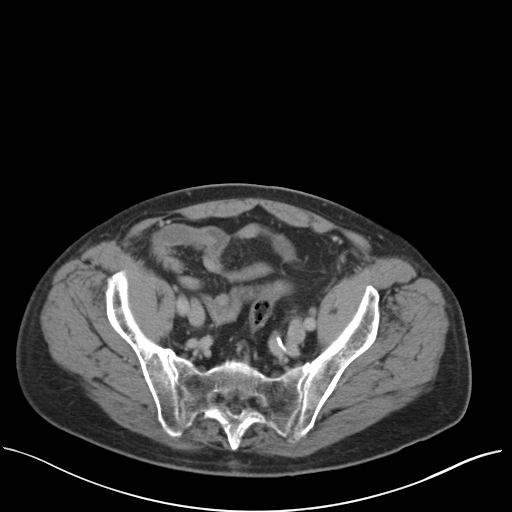
[im 37/97  soft-tissue]
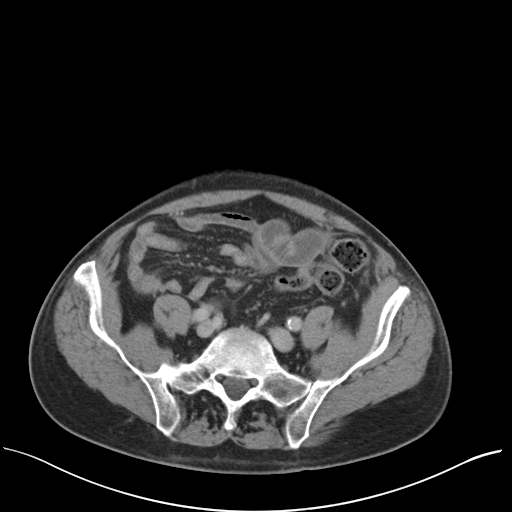
[im 43/97  soft-tissue]
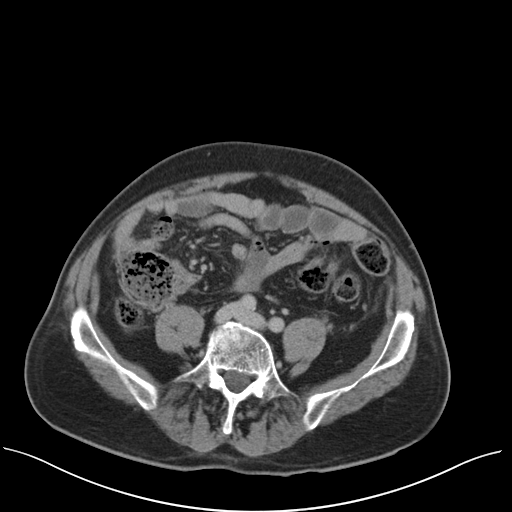
[im 55/97  soft-tissue]
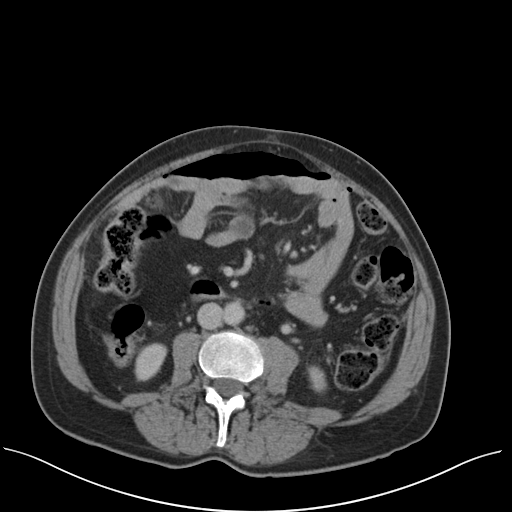
[im 61/97  soft-tissue]
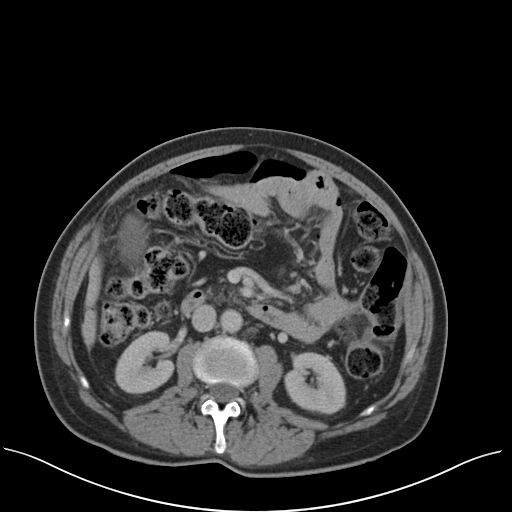
[im 67/97  soft-tissue]
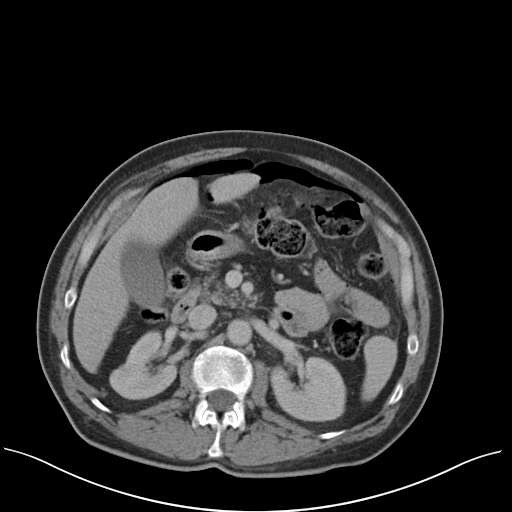
[im 67/97  bone]
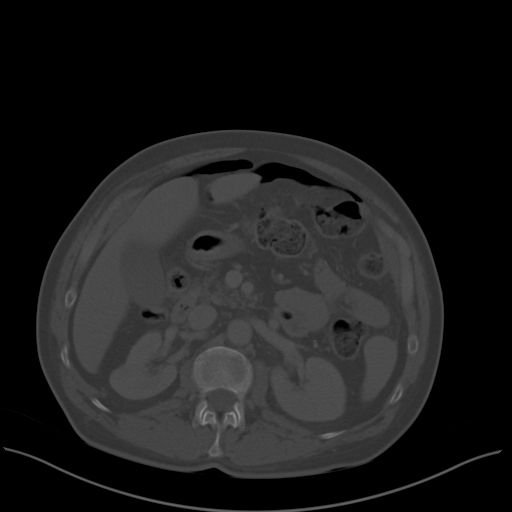
[im 73/97  soft-tissue]
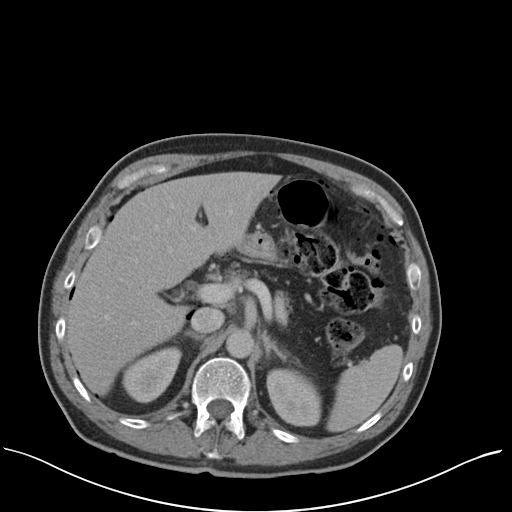
[im 85/97  soft-tissue]
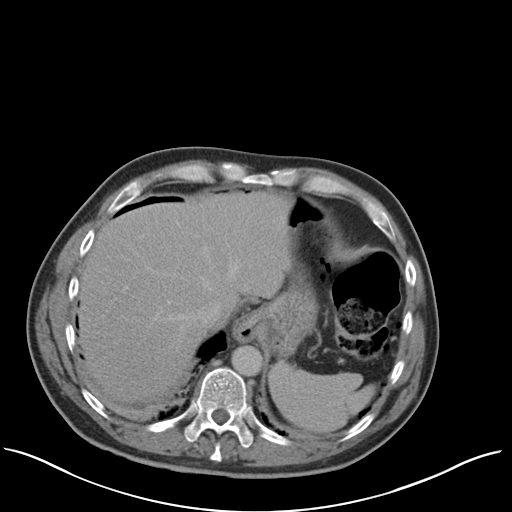
[im 91/97  soft-tissue]
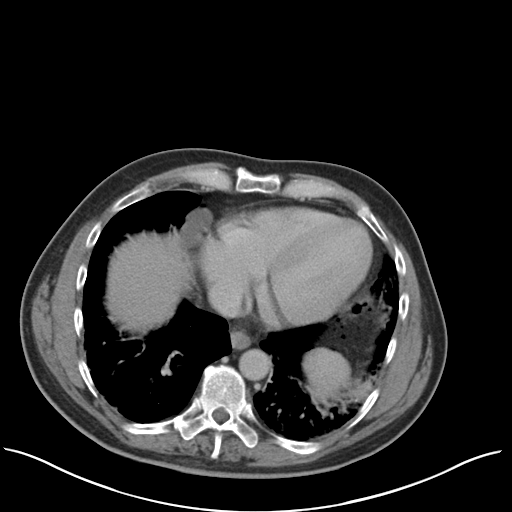

[Series 4: coronal a/|p · coronal · 0.74mm/px · 3 of 168 slices shown]
[im 56/168  soft-tissue]
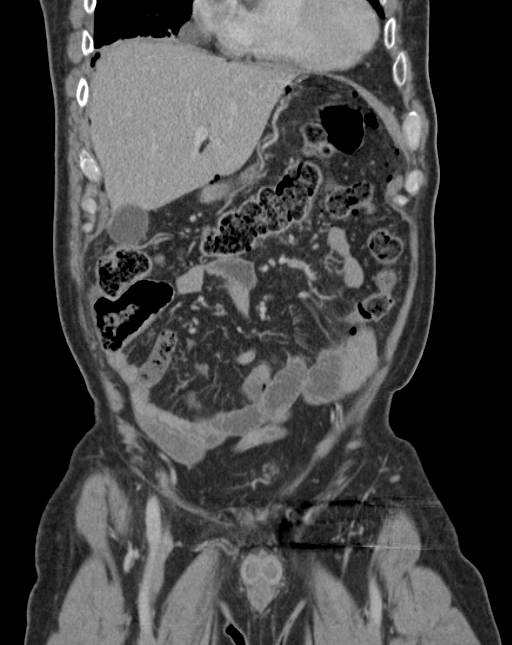
[im 75/168  soft-tissue]
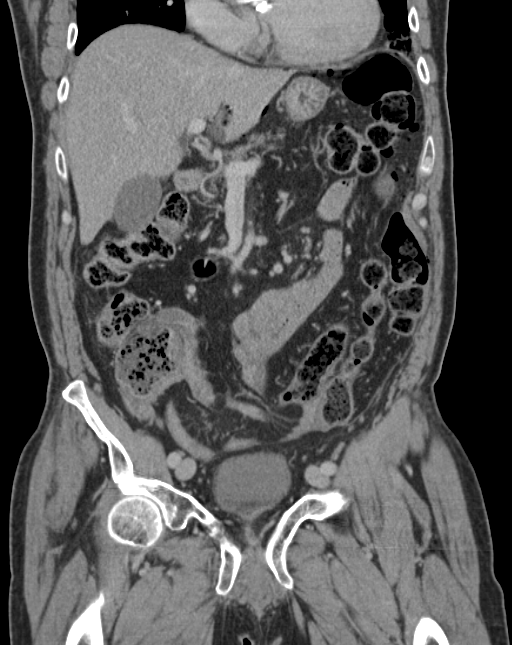
[im 93/168  soft-tissue]
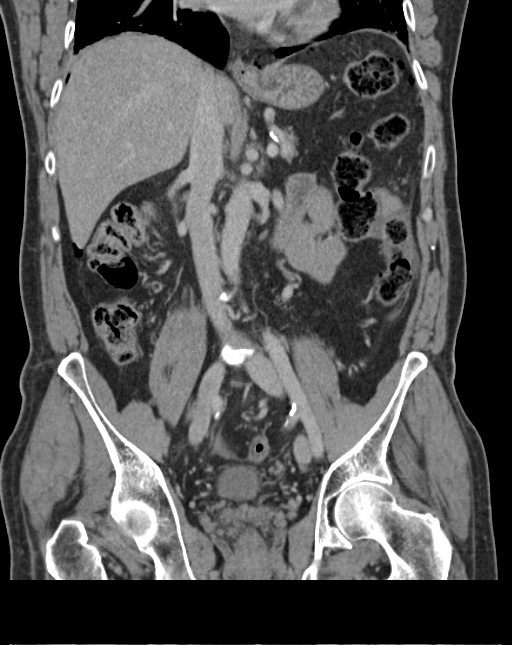

[15 of 46 positions shown; findings below may reference images not displayed]

FINDINGS: Lower chest: Bibasilar airspace disease likely reflects atelectasis.
There is some consolidation on the left which could represent early
infection or aspiration. The heart size is normal. Calcifications
are present at the aortic valves.

Hepatobiliary: No focal hepatic lesions are present. Liver contour
is within normal limits. Common bile duct is normal. Small layering
stones are present at the neck of the gallbladder without evidence
for inflammation.

Pancreas: No significant inflammatory changes are present. No focal
solid or cystic mass lesion is present. The pancreatic duct is
within normal limits.

Spleen: Unremarkable

Adrenals/Urinary Tract: The adrenal glands are normal bilaterally.
Kidneys and ureters are within normal limits. Urinary bladder is
mostly collapsed.

Stomach/Bowel: Free air a is layering within the abdomen extends
into an umbilical hernia. There is diffuse free air within the
colonic mesenteries.

A small hiatal hernia is present. The stomach and duodenum are
otherwise within normal limits. The proximal small bowel is
unremarkable. Fluid filled loops of bowel in the mid abdomen are
noted. The more distal small bowel is collapsed. There is some wall
thickening of the terminal ileum at the ileocecal valve.
Inflammatory changes surround the ascending colon. No discrete mass
lesion is present. Moderate stool is present throughout the colon to
the level of a redundant sigmoid. The distal sigmoid and rectum are
decompressed.

Vascular/Lymphatic: Atherosclerotic calcifications are present in
the distal abdominal aorta and branch vessels. No significant
adenopathy is present.

Reproductive: Within normal limits

Other: Extensive free air is present. There is no significant free
fluid.

Musculoskeletal: Bone windows demonstrate mild rightward curvature
at the thoracolumbar lumbar junction. A remote superior endplate
fractures present at L5. No other focal osseous lesions are present.
The pelvis is intact.
IMPRESSION: 1. Pneumoperitoneum.
2. Moderate stool throughout the colon involving a a redundant
sigmoid without focal obstruction or mass.
3. Mild inflammatory changes at the level the cecum and ileocecal
valve without focal obstruction or mass.
4. The distal sigmoid and rectum are collapsed.
5. Atherosclerosis.
6. Bibasilar airspace disease is slightly greater than expected for
atelectasis. Infection or aspiration is not excluded, particularly
at the left base.
These results were called by telephone at the time of interpretation
on 04/25/2016 at [DATE] to Dr. ASIA RUNNER , who verbally
acknowledged these results.

## 2018-01-13 ENCOUNTER — Other Ambulatory Visit: Payer: Self-pay | Admitting: Family Medicine

## 2018-01-15 ENCOUNTER — Other Ambulatory Visit: Payer: Self-pay | Admitting: Cardiovascular Disease

## 2018-01-20 ENCOUNTER — Encounter: Payer: Self-pay | Admitting: Cardiovascular Disease

## 2018-01-30 ENCOUNTER — Other Ambulatory Visit: Payer: Self-pay | Admitting: Cardiovascular Disease

## 2018-02-02 ENCOUNTER — Other Ambulatory Visit: Payer: Self-pay | Admitting: Cardiovascular Disease

## 2018-02-02 NOTE — Telephone Encounter (Signed)
Rx request sent to pharmacy.  

## 2018-02-22 DIAGNOSIS — Z5181 Encounter for therapeutic drug level monitoring: Secondary | ICD-10-CM | POA: Diagnosis not present

## 2018-02-22 DIAGNOSIS — I10 Essential (primary) hypertension: Secondary | ICD-10-CM | POA: Diagnosis not present

## 2018-02-22 DIAGNOSIS — E7849 Other hyperlipidemia: Secondary | ICD-10-CM | POA: Diagnosis not present

## 2018-02-22 LAB — COMPREHENSIVE METABOLIC PANEL
ALK PHOS: 64 IU/L (ref 39–117)
ALT: 37 IU/L (ref 0–44)
AST: 34 IU/L (ref 0–40)
Albumin/Globulin Ratio: 2.3 — ABNORMAL HIGH (ref 1.2–2.2)
Albumin: 4.5 g/dL (ref 3.6–4.8)
BILIRUBIN TOTAL: 0.5 mg/dL (ref 0.0–1.2)
BUN/Creatinine Ratio: 17 (ref 10–24)
BUN: 19 mg/dL (ref 8–27)
CHLORIDE: 100 mmol/L (ref 96–106)
CO2: 23 mmol/L (ref 20–29)
CREATININE: 1.11 mg/dL (ref 0.76–1.27)
Calcium: 9.5 mg/dL (ref 8.6–10.2)
GFR calc Af Amer: 79 mL/min/{1.73_m2} (ref >59)
GFR calc non Af Amer: 68 mL/min/{1.73_m2} (ref >59)
GLUCOSE: 80 mg/dL (ref 65–99)
Globulin, Total: 2 g/dL (ref 1.5–4.5)
Potassium: 5 mmol/L (ref 3.5–5.2)
Sodium: 139 mmol/L (ref 134–144)
Total Protein: 6.5 g/dL (ref 6.0–8.5)

## 2018-02-22 LAB — LIPID PANEL
CHOL/HDL RATIO: 2.6 ratio (ref 0.0–5.0)
Cholesterol, Total: 157 mg/dL (ref 100–199)
HDL: 60 mg/dL (ref >39)
LDL CALC: 75 mg/dL (ref 0–99)
Triglycerides: 110 mg/dL (ref 0–149)
VLDL CHOLESTEROL CAL: 22 mg/dL (ref 5–40)

## 2018-03-27 NOTE — Progress Notes (Addendum)
Subjective:   [name] Brown is a 67 y.o. male who presents for Medicare Annual/Subsequent preventive examination.  Review of Systems:  No ROS.  Medicare Wellness Visit. Additional risk factors are reflected in the social history.  Cardiac Risk Factors include: advanced age (>74men, >74 women);family history of premature cardiovascular disease;male gender;dyslipidemia;hypertension   Sleep patterns: Sleeps 7-8 hours, up to void x 3. Takes Trazodone nightly.  Home Safety/Smoke Alarms: Feels safe in home. Smoke alarms in place.  Living environment; residence and Firearm Safety: Lives with wife in 2 story home.  Seat Belt Safety/Bike Helmet: Wears seat belt.    Male:   CCS-Colonoscopy 07/24/2016, followed by GI and surgery ([place]). Recall 5 years.     PSA-  Lab Results  Component Value Date   PSA 0.51 03/23/2017       Objective:    Vitals: BP 136/76 (BP Location: Left Arm, Patient Position: Sitting, Cuff Size: Normal)   Pulse 63   Temp 97.9 F (36.6 C) (Temporal)   Resp 16   Ht 5\' 6"  (1.676 m)   Wt 166 lb 4 oz (75.4 kg)   SpO2 98%   BMI 26.83 kg/m   Body mass index is 26.83 kg/m.  Advanced Directives 03/28/2018 07/25/2017 06/10/2017 06/06/2017 03/23/2017 11/03/2016 09/20/2016  Does Patient Have a Medical Advance Directive? Yes Yes Yes Yes Yes Yes Yes  Type of Paramedic of [place];Living will - [place];Living will [place];Living will [place];Living will [place];Living will [place];Living will  Does patient want to make changes to medical advance directive? - - No - Patient declined - - No - Patient declined -  Copy of [place] in Chart? Yes - No - copy requested No - copy requested No - copy requested Yes No - copy requested    Tobacco Social History   Tobacco Use  Smoking Status Never Smoker  Smokeless Tobacco Never Used       Counseling given: Not Answered    Past Medical History:  Diagnosis Date  . Aortic stenosis   . Colon cancer ([place])   . Family history of adverse reaction to anesthesia    pt states his mother had 2 episodes of hyponatremia following anesthesia   . GERD (gastroesophageal reflux disease)   . Headache   . Heart murmur   . Hyperlipidemia 10/25/2016  . Hypertension    pt is currently not taking any medications; pt states is borderline  . Melanoma ([place])   . PAF (paroxysmal atrial fibrillation) ([place]) 06/13/2017  . Perforated sigmoid colon ([place])   . PVC's (premature ventricular contractions) 10/25/2016  . Tinnitus   . Wears glasses    Past Surgical History:  Procedure Laterality Date  . AORTIC VALVE REPLACEMENT N/A 06/08/2017   Procedure: AORTIC VALVE REPLACEMENT (AVR);  Surgeon: [name], MD;  Location: [facility];  Service: Open Heart Surgery;  Laterality: N/A;  Using 51mm Perimount Magna Ease Pericardial Bioprosthesis Aortic Valve  . COLOSTOMY CLOSURE N/A 08/18/2016   Procedure: COLOSTOMY CLOSURE OPEN PROCEDURE;  Surgeon: Armandina Gemma, MD;  Location: [facility];  Service: General;  Laterality: N/A;  . LAPAROTOMY N/A 04/25/2016   Procedure: EXPLORATORY LAPAROTOMY WITH SIGMOID COLECTOMY, COLOSTOMY;  Surgeon: Armandina Gemma, MD;  Location: [facility];  Service: General;  Laterality: N/A;  . LEFT HEART CATH AND CORONARY ANGIOGRAPHY N/A 11/03/2016   Procedure: Left Heart Cath and Coronary Angiography;  Surgeon: Annita Brod  Angelena Form, MD;  Location: [facility];  Service: Cardiovascular;  Laterality: N/A;  . MELANOMA EXCISION    . PARTIAL COLECTOMY Right 08/18/2016   Procedure: RIGHT COLECTOMY;  Surgeon: Armandina Gemma, MD;  Location: [facility];  Service: General;  Laterality: Right;  . TEE WITHOUT CARDIOVERSION N/A 06/08/2017   Procedure: TRANSESOPHAGEAL ECHOCARDIOGRAM (TEE);  Surgeon: [name], MD;  Location: [facility];  Service: Open Heart Surgery;  Laterality: N/A;   Family History  Problem  Relation Age of Onset  . Dementia Mother   . Kidney disease Mother   . Vascular Disease Mother   . CAD Mother   . Heart attack Father   . Alcohol abuse Father   . Cirrhosis Father   . Stroke Maternal Grandmother   . Stroke Paternal Grandmother    Social History   Socioeconomic History  . Marital status: Married    Spouse name: Not on file  . Number of children: Not on file  . Years of education: Not on file  . Highest education level: Not on file  Occupational History  . Occupation: Retired Lawyer  . Financial resource strain: Not on file  . Food insecurity:    Worry: Never true    Inability: Never true  . Transportation needs:    Medical: No    Non-medical: No  Tobacco Use  . Smoking status: Never Smoker  . Smokeless tobacco: Never Used  Substance and Sexual Activity  . Alcohol use: Yes    Comment: 2-3 glasses of wine daily   . Drug use: No  . Sexual activity: Not on file  Lifestyle  . Physical activity:    Days per week: 4 days    Minutes per session: 30 min  . Stress: Not at all  Relationships  . Social connections:    Talks on phone: Not on file    Gets together: Not on file    Attends religious service: Not on file    Active member of club or organization: Not on file    Attends meetings of clubs or organizations: Not on file    Relationship status: Married  Other Topics Concern  . Not on file  Social History Narrative   Pt lives in [place] with his wife, they are active.    Outpatient Encounter Medications as of 03/28/2018  Medication Sig  . aspirin EC 81 MG tablet Take 81 mg by mouth daily.  [name] ezetimibe (ZETIA) 10 MG tablet TAKE ONE TABLET BY MOUTH DAILY  . famotidine (PEPCID) 20 MG tablet Take 20 mg by mouth daily as needed for heartburn or indigestion.  [name] losartan (COZAAR) 25 MG tablet TAKE ONE TABLET BY MOUTH DAILY  . metoprolol succinate (TOPROL XL) 25 MG 24 hr tablet Take 1 tablet (25 mg total) by mouth 2 (two) times  daily.  . pravastatin (PRAVACHOL) 10 MG tablet Take 1 tablet (10 mg total) by mouth daily.  . sildenafil (VIAGRA) 50 MG tablet TAKE ONE TABLET BY MOUTH DAILY AS NEEDED  . traZODone (DESYREL) 50 MG tablet TAKE ONE-HALF TO ONE TABLET BY MOUTH EVERY NIGHT AT BEDTIME AS NEEDED FOR SLEEP  . Zoster Vaccine Adjuvanted [facility]) injection Inject 0.5 mLs into the muscle once for 1 dose.   No facility-administered encounter medications on file as of 03/28/2018.     Activities of Daily Living In your present state of health, do you have any difficulty performing the following activities: 03/28/2018 09/18/2017  Hearing? N N  Vision? N N  Difficulty concentrating or making decisions? N N  Walking or climbing stairs? N N  Dressing or bathing? N N  Doing errands, shopping? N N  Preparing Food and eating ? N -  Using the Toilet? N -  In the past six months, have you accidently leaked urine? N -  Do you have problems with loss of bowel control? N -  Managing your Medications? N -  Managing your Finances? N -  Housekeeping or managing your Housekeeping? N -  Some recent data might be hidden    Patient Care Team: [name], MD as PCP - General (Family Medicine) Skeet Latch, MD as Attending Physician (Cardiology) Ladell Pier, MD as Consulting Physician (Oncology) Clarene Essex, MD as Consulting Physician (Gastroenterology) [name], MD as Consulting Physician (Dermatology) Armandina Gemma, MD as Consulting Physician (General Surgery)   Assessment:   This is a routine wellness examination for [name] Brown.  Exercise Activities and Dietary recommendations Current Exercise Habits: Structured exercise class, Type of exercise: strength training/weights(aerobics), Time (Minutes): 60, Frequency (Times/Week): 5, Weekly Exercise (Minutes/Week): 300, Exercise limited by: None identified   Diet (meal preparation, eat out, water intake, caffeinated beverages, dairy products, fruits and vegetables):  Drinks coffee, water and wine.   Breakfast: oatmeal, tomato, salsa and olives; coffee (with syrup and milk) Lunch: occasionally skips; leftovers; canned fish Dinner: fish and vegetables.   Follows pescatarian diet.   Goals    . Patient Stated     Decrease weight and continue exercise.        Fall Risk Fall Risk  03/28/2018 09/18/2017 07/25/2017 03/23/2017 02/07/2017  Falls in the past year? No No No No No     Depression Screen PHQ 2/9 Scores 03/28/2018 10/27/2017 09/18/2017 07/31/2017  PHQ - 2 Score 0 0 0 0  PHQ- 9 Score - - 0 -    Cognitive Function MMSE - Mini Mental State Exam 03/28/2018  Orientation to time 5  Orientation to Place 5  Registration 3  Attention/ Calculation 5  Recall 3  Language- name 2 objects 2  Language- repeat 1  Language- follow 3 step command 3  Language- read & follow direction 1  Write a sentence 1  Copy design 1  Total score 30        Immunization History  Administered Date(s) Administered  . Influenza,inj,Quad PF,6+ Mos 07/09/2014, 07/02/2015, 06/28/2016, 09/18/2017  . Pneumococcal Conjugate-13 09/23/2016  . Pneumococcal Polysaccharide-23 03/28/2018    Screening Tests Health Maintenance  Topic Date Due  . TETANUS/TDAP  09/18/2018 (Originally 08/24/1970)  . Hepatitis C Screening  09/18/2018 (Originally 1951-06-28)  . INFLUENZA VACCINE  04/12/2018  . COLONOSCOPY  07/25/2026  . PNA vac Low Risk Adult  Completed        Plan:    Shingles vaccine at pharmacy.   Continue doing brain stimulating activities (puzzles, reading, adult coloring books, staying active) to keep memory sharp.   I have personally reviewed and noted the following in the patient's chart:   . Medical and social history . Use of alcohol, tobacco or illicit drugs  . Current medications and supplements . Functional ability and status . Nutritional status . Physical activity . Advanced directives . List of other physicians . Hospitalizations, surgeries, and ER  visits in previous 12 months . Vitals . Screenings to include cognitive, depression, and falls . Referrals and appointments  In addition, I have reviewed and discussed with patient certain preventive protocols, quality metrics, and best practice recommendations. A  written personalized care plan for preventive services as well as general preventive health recommendations were provided to patient.     Gerilyn Nestle, RN  03/28/2018  Reviewed documentation provided by RN and agree w/ above  [name], MD

## 2018-03-28 ENCOUNTER — Ambulatory Visit (INDEPENDENT_AMBULATORY_CARE_PROVIDER_SITE_OTHER): Payer: Medicare Other | Admitting: Family Medicine

## 2018-03-28 ENCOUNTER — Ambulatory Visit (INDEPENDENT_AMBULATORY_CARE_PROVIDER_SITE_OTHER): Payer: Medicare Other

## 2018-03-28 ENCOUNTER — Encounter: Payer: Self-pay | Admitting: Family Medicine

## 2018-03-28 ENCOUNTER — Other Ambulatory Visit: Payer: Self-pay

## 2018-03-28 VITALS — BP 136/76 | HR 63 | Temp 97.9°F | Resp 16 | Ht 66.0 in | Wt 166.2 lb

## 2018-03-28 DIAGNOSIS — Z23 Encounter for immunization: Secondary | ICD-10-CM

## 2018-03-28 DIAGNOSIS — Z0000 Encounter for general adult medical examination without abnormal findings: Secondary | ICD-10-CM | POA: Diagnosis not present

## 2018-03-28 DIAGNOSIS — M25512 Pain in left shoulder: Secondary | ICD-10-CM | POA: Diagnosis not present

## 2018-03-28 DIAGNOSIS — Z125 Encounter for screening for malignant neoplasm of prostate: Secondary | ICD-10-CM

## 2018-03-28 DIAGNOSIS — E7849 Other hyperlipidemia: Secondary | ICD-10-CM

## 2018-03-28 DIAGNOSIS — I10 Essential (primary) hypertension: Secondary | ICD-10-CM | POA: Diagnosis not present

## 2018-03-28 LAB — TSH: TSH: 5.43 u[IU]/mL — AB (ref 0.35–4.50)

## 2018-03-28 LAB — CBC WITH DIFFERENTIAL/PLATELET
BASOS ABS: 0 10*3/uL (ref 0.0–0.1)
Basophils Relative: 0.7 % (ref 0.0–3.0)
EOS PCT: 2.1 % (ref 0.0–5.0)
Eosinophils Absolute: 0.1 10*3/uL (ref 0.0–0.7)
HCT: 46.2 % (ref 39.0–52.0)
HEMOGLOBIN: 15.7 g/dL (ref 13.0–17.0)
Lymphocytes Relative: 22 % (ref 12.0–46.0)
Lymphs Abs: 0.9 10*3/uL (ref 0.7–4.0)
MCHC: 34 g/dL (ref 30.0–36.0)
MCV: 99 fl (ref 78.0–100.0)
Monocytes Absolute: 0.4 10*3/uL (ref 0.1–1.0)
Monocytes Relative: 10.6 % (ref 3.0–12.0)
Neutro Abs: 2.7 10*3/uL (ref 1.4–7.7)
Neutrophils Relative %: 64.6 % (ref 43.0–77.0)
Platelets: 178 10*3/uL (ref 150.0–400.0)
RBC: 4.67 Mil/uL (ref 4.22–5.81)
RDW: 13.2 % (ref 11.5–15.5)
WBC: 4.2 10*3/uL (ref 4.0–10.5)

## 2018-03-28 LAB — PSA, MEDICARE: PSA: 0.33 ng/ml (ref 0.10–4.00)

## 2018-03-28 MED ORDER — ZOSTER VAC RECOMB ADJUVANTED 50 MCG/0.5ML IM SUSR
0.5000 mL | Freq: Once | INTRAMUSCULAR | 1 refills | Status: AC
Start: 2018-03-28 — End: 2018-03-28

## 2018-03-28 NOTE — Assessment & Plan Note (Signed)
Chronic problem.  On Zetia and Pravastatin (since Dr Oval Linsey added this).  Lipids done last month look great.  Asymptomatic.  Will follow along.

## 2018-03-28 NOTE — Patient Instructions (Addendum)
Shingles vaccine at pharmacy.   Continue doing brain stimulating activities (puzzles, reading, adult coloring books, staying active) to keep memory sharp.    Health Maintenance, Male A healthy lifestyle and preventive care is important for your health and wellness. Ask your health care provider about what schedule of regular examinations is right for you. What should I know about weight and diet? Eat a Healthy Diet  Eat plenty of vegetables, fruits, whole grains, low-fat dairy products, and lean protein.  Do not eat a lot of foods high in solid fats, added sugars, or salt.  Maintain a Healthy Weight Regular exercise can help you achieve or maintain a healthy weight. You should:  Do at least 150 minutes of exercise each week. The exercise should increase your heart rate and make you sweat (moderate-intensity exercise).  Do strength-training exercises at least twice a week.  Watch Your Levels of Cholesterol and Blood Lipids  Have your blood tested for lipids and cholesterol every 5 years starting at 68 years of age. If you are at high risk for heart disease, you should start having your blood tested when you are 68 years old. You may need to have your cholesterol levels checked more often if: ? Your lipid or cholesterol levels are high. ? You are older than 68 years of age. ? You are at high risk for heart disease.  What should I know about cancer screening? Many types of cancers can be detected early and may often be prevented. Lung Cancer  You should be screened every year for lung cancer if: ? You are a current smoker who has smoked for at least 30 years. ? You are a former smoker who has quit within the past 15 years.  Talk to your health care provider about your screening options, when you should start screening, and how often you should be screened.  Colorectal Cancer  Routine colorectal cancer screening usually begins at 68 years of age and should be repeated every 5-10  years until you are 68 years old. You may need to be screened more often if early forms of precancerous polyps or small growths are found. Your health care provider may recommend screening at an earlier age if you have risk factors for colon cancer.  Your health care provider may recommend using home test kits to check for hidden blood in the stool.  A small camera at the end of a tube can be used to examine your colon (sigmoidoscopy or colonoscopy). This checks for the earliest forms of colorectal cancer.  Prostate and Testicular Cancer  Depending on your age and overall health, your health care provider may do certain tests to screen for prostate and testicular cancer.  Talk to your health care provider about any symptoms or concerns you have about testicular or prostate cancer.  Skin Cancer  Check your skin from head to toe regularly.  Tell your health care provider about any new moles or changes in moles, especially if: ? There is a change in a mole's size, shape, or color. ? You have a mole that is larger than a pencil eraser.  Always use sunscreen. Apply sunscreen liberally and repeat throughout the day.  Protect yourself by wearing long sleeves, pants, a wide-brimmed hat, and sunglasses when outside.  What should I know about heart disease, diabetes, and high blood pressure?  If you are 18-39 years of age, have your blood pressure checked every 3-5 years. If you are 40 years of age or older, have   your blood pressure checked every year. You should have your blood pressure measured twice-once when you are at a hospital or clinic, and once when you are not at a hospital or clinic. Record the average of the two measurements. To check your blood pressure when you are not at a hospital or clinic, you can use: ? An automated blood pressure machine at a pharmacy. ? A home blood pressure monitor.  Talk to your health care provider about your target blood pressure.  If you are between  45-79 years old, ask your health care provider if you should take aspirin to prevent heart disease.  Have regular diabetes screenings by checking your fasting blood sugar level. ? If you are at a normal weight and have a low risk for diabetes, have this test once every three years after the age of 45. ? If you are overweight and have a high risk for diabetes, consider being tested at a younger age or more often.  A one-time screening for abdominal aortic aneurysm (AAA) by ultrasound is recommended for men aged 65-75 years who are current or former smokers. What should I know about preventing infection? Hepatitis B If you have a higher risk for hepatitis B, you should be screened for this virus. Talk with your health care provider to find out if you are at risk for hepatitis B infection. Hepatitis C Blood testing is recommended for:  Everyone born from 1945 through 1965.  Anyone with known risk factors for hepatitis C.  Sexually Transmitted Diseases (STDs)  You should be screened each year for STDs including gonorrhea and chlamydia if: ? You are sexually active and are younger than 68 years of age. ? You are older than 68 years of age and your health care provider tells you that you are at risk for this type of infection. ? Your sexual activity has changed since you were last screened and you are at an increased risk for chlamydia or gonorrhea. Ask your health care provider if you are at risk.  Talk with your health care provider about whether you are at high risk of being infected with HIV. Your health care provider may recommend a prescription medicine to help prevent HIV infection.  What else can I do?  Schedule regular health, dental, and eye exams.  Stay current with your vaccines (immunizations).  Do not use any tobacco products, such as cigarettes, chewing tobacco, and e-cigarettes. If you need help quitting, ask your health care provider.  Limit alcohol intake to no more than  2 drinks per day. One drink equals 12 ounces of beer, 5 ounces of wine, or 1 ounces of hard liquor.  Do not use street drugs.  Do not share needles.  Ask your health care provider for help if you need support or information about quitting drugs.  Tell your health care provider if you often feel depressed.  Tell your health care provider if you have ever been abused or do not feel safe at home. This information is not intended to replace advice given to you by your health care provider. Make sure you discuss any questions you have with your health care provider. Document Released: 02/25/2008 Document Revised: 04/27/2016 Document Reviewed: 06/02/2015 Elsevier Interactive Patient Education  2018 Elsevier Inc.  

## 2018-03-28 NOTE — Progress Notes (Signed)
   Subjective:    Patient ID: [name] Brown, male    DOB: Sep 08, 1951, 67 y.o.   MRN: [id number]  HPI HTN- chronic problem, on Losartan 25mg  daily, Metoprolol 25mg  daily w/ adequate control.  No CP, SOB, HAs, visual changes, edema.  Hyperlipidemia- chronic problem, on Zetia 10mg  daily, Pravastatin 10mg  daily was added by Dr Oval Linsey and June labs were fantastic.  Pt is down 5 lbs since last visit.  Pt is exercising regularly.  No abd pain, N/V.  L shoulder pain- pt notices w/ exercise and upon waking.  Pt has hx of fracture 18 yrs ago.  Pt has had to decrease amount of weight that he is lifting.  Pain w/ forward flexion >90 degrees.  No pain w/ abduction.  No pain w/ internal rotation.   Review of Systems For ROS see HPI     Objective:   Physical Exam  Constitutional: He is oriented to person, place, and time. He appears well-developed and well-nourished. No distress.  HENT:  Head: Normocephalic and atraumatic.  Eyes: Pupils are equal, round, and reactive to light. Conjunctivae and EOM are normal.  Neck: Normal range of motion. Neck supple. No thyromegaly present.  Cardiovascular: Normal rate, regular rhythm, normal heart sounds and intact distal pulses.  No murmur heard. Pulmonary/Chest: Effort normal and breath sounds normal. No respiratory distress.  Abdominal: Soft. Bowel sounds are normal. He exhibits no distension.  Musculoskeletal: He exhibits no edema or tenderness (no TTP over L clavicle or AC joint).  Mild hawkins and empty can test on L No pain w/ abduction or internal rotation Pain w/ forward flexion >90  Lymphadenopathy:    He has no cervical adenopathy.  Neurological: He is alert and oriented to person, place, and time. No cranial nerve deficit.  Skin: Skin is warm and dry.  Psychiatric: He has a normal mood and affect. His behavior is normal.  Vitals reviewed.         Assessment & Plan:  L shoulder pain- pt has had to reduce his weight at the gym and notes pain  w/ overhead positions.  Refer to Sports Med for complete evaluation and tx prn.

## 2018-03-28 NOTE — Assessment & Plan Note (Signed)
Chronic problem.  Adequate control.  Reviewed labs done in June- no need for med changes.  Pt is asymptomatic.  Will continue to follow.

## 2018-03-28 NOTE — Patient Instructions (Signed)
Follow up in 6 months to recheck BP and cholesterol We'll notify you of your lab results and make any changes if needed Keep up the good work on healthy diet and regular exercise- you look great! We'll call you with your sports med appt for the shoulder Call with any questions or concerns Have a great summer!!

## 2018-03-29 ENCOUNTER — Other Ambulatory Visit: Payer: Self-pay | Admitting: Emergency Medicine

## 2018-03-29 DIAGNOSIS — R7989 Other specified abnormal findings of blood chemistry: Secondary | ICD-10-CM

## 2018-03-29 DIAGNOSIS — E039 Hypothyroidism, unspecified: Secondary | ICD-10-CM

## 2018-03-29 MED ORDER — LEVOTHYROXINE SODIUM 50 MCG PO TABS: 50.0000 ug | ORAL_TABLET | Freq: Every day | ORAL | 1 refills | Status: DC

## 2018-04-03 ENCOUNTER — Encounter: Payer: Self-pay | Admitting: Sports Medicine

## 2018-04-03 ENCOUNTER — Ambulatory Visit (INDEPENDENT_AMBULATORY_CARE_PROVIDER_SITE_OTHER): Payer: Medicare Other | Admitting: Sports Medicine

## 2018-04-03 ENCOUNTER — Ambulatory Visit: Payer: Self-pay

## 2018-04-03 VITALS — BP 130/82 | HR 62 | Ht 66.0 in | Wt 167.4 lb

## 2018-04-03 DIAGNOSIS — M75102 Unspecified rotator cuff tear or rupture of left shoulder, not specified as traumatic: Secondary | ICD-10-CM | POA: Insufficient documentation

## 2018-04-03 DIAGNOSIS — G2589 Other specified extrapyramidal and movement disorders: Secondary | ICD-10-CM

## 2018-04-03 DIAGNOSIS — M25512 Pain in left shoulder: Secondary | ICD-10-CM | POA: Diagnosis not present

## 2018-04-03 MED ORDER — NITROGLYCERIN 0.2 MG/HR TD PT24: MEDICATED_PATCH | TRANSDERMAL | 1 refills | Status: DC

## 2018-04-03 NOTE — Progress Notes (Signed)

## 2018-04-03 NOTE — Patient Instructions (Addendum)
Please perform the exercise program that we have prepared for you and gone over in detail on a daily basis.  In addition to the handout you were provided you can access your program through: [email or web address]   Your unique program code is: [email or web address]   Nitroglycerin Protocol   Apply 1/4 nitroglycerin patch to affected area daily.  Change position of patch within the affected area every 24 hours.  You may experience a headache during the first 1-2 weeks of using the patch, these should subside.  If you experience headaches after beginning nitroglycerin patch treatment, you may take your preferred over the counter pain reliever.  Another side effect of the nitroglycerin patch is skin irritation or rash related to patch adhesive.  Please notify our office if you develop more severe headaches or rash, and stop the patch.  Tendon healing with nitroglycerin patch may require 12 to 24 weeks depending on the extent of injury.  Men should not use if taking Viagra, Cialis, or Levitra.   Do not use if you have migraines or rosacea.

## 2018-04-03 NOTE — Procedures (Signed)
LIMITED MSK ULTRASOUND OF Left shoulder Images were obtained and interpreted by myself, Teresa Coombs, DO  Images have been saved and stored to PACS system. Images obtained on: GE S7 Ultrasound machine  FINDINGS:  Biceps Tendon: Normal Pec Major Insertion: Normal Subscapularis Tendon: Normal Supraspinatus Tendon: Thickening and Tendinopathic changes, small superficial partial-thickness split, minimal bursitis Infraspinatus/Teres Minor Tendon: Normal AC Joint: Mild degenerative changes with small mushroom sign and small osteophytic spurring JOINT: No significant GH spurring appreciated  LABRUM: Not evaluated   IMPRESSION:  1. Supraspinatus tendinopathy with a small amount of bursal swelling

## 2018-04-03 NOTE — Progress Notes (Signed)
[name] Brown. [name] Brown, [name] Brown at [facility]  [name] Brown - 67 y.o. male MRN [id number]  Date of birth: [birth year]  Visit Date: 04/03/2018  PCP: [name], MD   Referred by: [name], MD  Scribe(s) for today's visit: Josepha Pigg, CMA  SUBJECTIVE:  [name] Brown is here for Initial Assessment (L shoulder pain) .  Referred by: Dr. Annye Brown  04/03/2018: His L shoulder pain symptoms INITIALLY: Began about 1 month ago and started after doing shoulder press. He is R hand dominant.  Described as moderate stabbing with movement, non-radiating Worsened with movement, reaching overhead, bicep curls Improved with rest. Additional associated symptoms include: He has noticed occasional popping sensation in the shoulder. He denies clicking, catching, swelling, neck pain.     At this time symptoms are worsening compared to onset. He has not tried any medication or other modalities for the pain. He has taken a break from doing shoulder presses.   No recent XR of L shoulder   REVIEW OF SYSTEMS: Reports night time disturbances. Denies fevers, chills, or night sweats. Denies unexplained weight loss. Reports personal history of cancer - colon, melanoma. Denies changes in bowel or bladder habits. Denies recent unreported falls. Denies new or worsening dyspnea or wheezing. Denies headaches or dizziness.  Denies numbness, tingling or weakness  In the extremities.  Denies dizziness or presyncopal episodes Denies lower extremity edema    HISTORY & PERTINENT PRIOR DATA:  Prior History reviewed and updated per electronic medical record.  Significant/pertinent history, findings, studies include:  reports that he has never smoked. He has never used smokeless tobacco. Recent Labs    06/06/17 1206  HGBA1C 5.1   No specialty comments available. No problems updated.  OBJECTIVE:  VS:  HT:5\' 6"  (167.6 cm)    WT:167 lb 6.4 oz (75.9 kg)  BMI:27.03    BP:130/82  HR:62bpm  TEMP: ( )  RESP:96 %   PHYSICAL EXAM: Constitutional: WDWN, Non-toxic appearing. Psychiatric: Alert & appropriately interactive.  Not depressed or anxious appearing. Respiratory: No increased work of breathing.  Trachea Midline Eyes: Pupils are equal.  EOM intact without nystagmus.  No scleral icterus  Vascular Exam: warm to touch no edema  upper extremity neuro exam: unremarkable normal strength normal sensation  MSK Exam: Left shoulder is overall well aligned.  Is held in slight protraction.  Overhead lifting he has full range of motion is unrestricted.  Internal rotation is normal.  He has some pain with empty can testing that is mild but strength is well-preserved.  Pain is worse with O'Brien's testing and he has scapular dyskinesis with this.  Internal rotation strength is 5 out of 5 external rotation is 4 out of 5.  This is some pain with this.  Small amount of pain with axial load and circumduction with small amount of crepitation.   ASSESSMENT & PLAN:   1. Acute pain of left shoulder   2. Rotator cuff syndrome of left shoulder   3. Scapular dyskinesis     PLAN: Discussed the foundation of treatment for this condition is physical therapy and/or daily (5-6 days/week) therapeutic exercises, focusing on core strengthening, coordination, neuromuscular control/reeducation.  Therapeutic exercises prescribed per procedure note.  Discussed options with the patient today including biologic treatment with topical nitroglycerin. Patient has no contraindications & understands the risks, benefits and intentions of treatment. Emphasized the importance of rotating sites as well as appropriate and expected adverse reactions including  orthostasis, headache, adhesive sensitivity.  Begin with 1/4 patch to the affected area. Okay to titrate to half a patch as tolerated.  Therapeutic exercises per procedure note Discussed that the  anticipated amount of time for healing is 12- 24 weeks for Tendinopathic changes.  Emphasized the importance of improving blood flow as well as eccentric loading of the tendon.  We will plan to check in in 6 weeks and repeat ultrasound at that time.  Follow-up: Return in about 6 weeks (around 05/15/2018).     Please see additional documentation for Objective, Assessment and Plan sections. Pertinent additional documentation may be included in corresponding procedure notes, imaging studies, problem based documentation and patient instructions. Please see these sections of the encounter for additional information regarding this visit.  CMA/ATC served as Education administrator during this visit. History, Physical, and Plan performed by medical provider. Documentation and orders reviewed and attested to.      [name] Brown, [facility] Physician

## 2018-04-15 ENCOUNTER — Other Ambulatory Visit: Payer: Self-pay | Admitting: Cardiovascular Disease

## 2018-04-16 MED ORDER — SILDENAFIL CITRATE 50 MG PO TABS: 50.0000 mg | ORAL_TABLET | Freq: Every day | ORAL | 0 refills | Status: DC | PRN

## 2018-04-30 ENCOUNTER — Other Ambulatory Visit: Payer: Self-pay | Admitting: Cardiovascular Disease

## 2018-04-30 DIAGNOSIS — I10 Essential (primary) hypertension: Secondary | ICD-10-CM

## 2018-05-01 ENCOUNTER — Other Ambulatory Visit (INDEPENDENT_AMBULATORY_CARE_PROVIDER_SITE_OTHER): Payer: Medicare Other

## 2018-05-01 DIAGNOSIS — E039 Hypothyroidism, unspecified: Secondary | ICD-10-CM | POA: Diagnosis not present

## 2018-05-01 LAB — TSH: TSH: 2.82 u[IU]/mL (ref 0.35–4.50)

## 2018-05-02 ENCOUNTER — Other Ambulatory Visit: Payer: Self-pay | Admitting: Cardiovascular Disease

## 2018-05-04 ENCOUNTER — Other Ambulatory Visit: Payer: Self-pay | Admitting: Cardiovascular Disease

## 2018-05-10 ENCOUNTER — Other Ambulatory Visit: Payer: Self-pay | Admitting: Family Medicine

## 2018-05-15 ENCOUNTER — Ambulatory Visit (INDEPENDENT_AMBULATORY_CARE_PROVIDER_SITE_OTHER): Payer: Medicare Other | Admitting: Sports Medicine

## 2018-05-15 ENCOUNTER — Encounter: Payer: Self-pay | Admitting: Sports Medicine

## 2018-05-15 VITALS — BP 134/76 | HR 67 | Ht 66.0 in | Wt 165.0 lb

## 2018-05-15 DIAGNOSIS — G2589 Other specified extrapyramidal and movement disorders: Secondary | ICD-10-CM | POA: Diagnosis not present

## 2018-05-15 DIAGNOSIS — M75102 Unspecified rotator cuff tear or rupture of left shoulder, not specified as traumatic: Secondary | ICD-10-CM | POA: Diagnosis not present

## 2018-05-15 DIAGNOSIS — M25512 Pain in left shoulder: Secondary | ICD-10-CM | POA: Diagnosis not present

## 2018-05-15 NOTE — Progress Notes (Signed)
[name] Brown. [name] Brown, [place] [facility]  [name] Brown - 67 y.o. male MRN [id number]  Date of birth: 1950-10-01  Visit Date: 05/15/2018  PCP: [name], MD   Referred by: [name], MD  Scribe(s) for today's visit: Josepha Pigg, CMA  SUBJECTIVE:  [name] Brown is here for Follow-up (lt shoulder pain ) .  Referred by: Dr. [name]  04/03/2018: His L shoulder pain symptoms INITIALLY: Began about 1 month ago and started after doing shoulder press. He is R hand dominant.  Described as moderate stabbing with movement, non-radiating Worsened with movement, reaching overhead, bicep curls Improved with rest. Additional associated symptoms include: He has noticed occasional popping sensation in the shoulder. He denies clicking, catching, swelling, neck pain.     At this time symptoms are worsening compared to onset. He has not tried any medication or other modalities for the pain. He has taken a break from doing shoulder presses.   No recent XR of L shoulder  05/15/2018  Compared to the last office visit, his previously described symptoms are improving Pain is less intense and not as frequent.  Current symptoms are mild & are nonradiating He has been following the recommendations given by our office. He has not been regular on doing the exercises provided.  Nitro patch given by our office.  No recent XR of Left shoulder    REVIEW OF SYSTEMS: Reports night time disturbances. Waking up with pain due to position of arm. alleviates as soon as area is moved.  Denies fevers, chills, or night sweats. Denies unexplained weight loss. Reports personal history of cancer - colon, melanoma. Denies changes in bowel or bladder habits. Denies recent unreported falls. Denies new or worsening dyspnea or wheezing. Denies headaches or dizziness.  Denies numbness, tingling or weakness  In the extremities.  Denies  dizziness or presyncopal episodes Denies lower extremity edema    HISTORY:  Prior history reviewed and updated per electronic medical record.  Social History   Occupational History  . Occupation: Retired Software engineer  Tobacco Use  . Smoking status: Never Smoker  . Smokeless tobacco: Never Used  Substance and Sexual Activity  . Alcohol use: Yes    Comment: 2-3 glasses of wine daily   . Drug use: No  . Sexual activity: Not on file   Social History   Social History Narrative   Pt lives in [place] with his wife, they are active.    DATA OBTAINED & REVIEWED:   Recent Labs    06/06/17 1206  HGBA1C 5.1   . 04/03/2018: MSK ultrasound shows Tendinopathic changes of the supraspinatus. .   OBJECTIVE:  VS:  HT:5\' 6"  (167.6 cm)   WT:165 lb (74.8 kg)  BMI:26.64    BP:134/76  HR:67bpm  TEMP: ( )  RESP:97 %   PHYSICAL EXAM: CONSTITUTIONAL: Well-developed, Well-nourished and In no acute distress PSYCHIATRIC: Alert & appropriately interactive. and Not depressed or anxious appearing. RESPIRATORY: No increased work of breathing and Trachea Midline EYES: Pupils are equal., EOM intact without nystagmus. and No scleral icterus.  VASCULAR EXAM: Warm and well perfused NEURO: unremarkable  MSK Exam: Left shoulder  Well aligned, no significant deformity. No overlying skin changes. No focal bony tenderness   RANGE OF MOTION & STRENGTH  Good overhead range of motion.  He has minimal symptoms with empty can testing speeds testing O'Brien's testing.  He does have some scapular dyskinesis with anterior shoulder drop as  he raises his arms above his head still.   SPECIALITY TESTING:  No significant pain with axial loading circumduction.     ASSESSMENT   1. Acute pain of left shoulder   2. Rotator cuff syndrome of left shoulder   3. Scapular dyskinesis     PLAN:  Pertinent additional documentation may be included in corresponding procedure notes, imaging studies, problem based  documentation and patient instructions.  Procedures:  . None  Medications:  No orders of the defined types were placed in this encounter.  Discussion/Instructions: No problem-specific Assessment & Plan notes found for this encounter.  [name] He continues to show good improvement.  Continue on with current treatment course.  . Continue previously prescribed home exercise program.  . Discussed red flag symptoms that warrant earlier emergent evaluation and patient voices understanding. . Activity modifications and the importance of avoiding exacerbating activities (limiting pain to no more than a 4 / 10 during or following activity) recommended and discussed.  Follow-up:  . Return in about 6 weeks (around 06/26/2018) for , Consider MSK ultrasound follow-up..   . If any lack of improvement consider: further diagnostic evaluation with MRI/MR arthrogram  but currently doing well and showing steady clinical improvement. . referral to Physical Therapy  .      CMA/ATC served as Education administrator during this visit. History, Physical, and Plan performed by medical provider. Documentation and orders reviewed and attested to.      Gerda Diss, [facility] Physician

## 2018-05-17 ENCOUNTER — Telehealth: Payer: Self-pay | Admitting: *Deleted

## 2018-05-17 NOTE — Telephone Encounter (Signed)
Received message from pharmacy regarding patient using NTG and getting Viagra. Left message to call back and discuss further, Dr Oval Linsey not prescribing MD on NTG

## 2018-05-18 NOTE — Telephone Encounter (Signed)
Spoke with patient and Viagra was an auto refill. Patient using NTG patch short term for tendon tear. 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

## 2018-05-30 ENCOUNTER — Ambulatory Visit (INDEPENDENT_AMBULATORY_CARE_PROVIDER_SITE_OTHER): Payer: Medicare Other | Admitting: Physician Assistant

## 2018-05-30 ENCOUNTER — Other Ambulatory Visit: Payer: Self-pay

## 2018-05-30 ENCOUNTER — Encounter: Payer: Self-pay | Admitting: Physician Assistant

## 2018-05-30 ENCOUNTER — Ambulatory Visit: Payer: Self-pay | Admitting: Family Medicine

## 2018-05-30 VITALS — BP 130/80 | HR 69 | Temp 97.9°F | Resp 14 | Ht 66.0 in | Wt 163.0 lb

## 2018-05-30 DIAGNOSIS — R2689 Other abnormalities of gait and mobility: Secondary | ICD-10-CM

## 2018-05-30 LAB — CBC WITH DIFFERENTIAL/PLATELET
BASOS ABS: 0 10*3/uL (ref 0.0–0.1)
Basophils Relative: 0.6 % (ref 0.0–3.0)
Eosinophils Absolute: 0.1 10*3/uL (ref 0.0–0.7)
Eosinophils Relative: 1.3 % (ref 0.0–5.0)
HCT: 45.1 % (ref 39.0–52.0)
HEMOGLOBIN: 15.6 g/dL (ref 13.0–17.0)
LYMPHS ABS: 0.8 10*3/uL (ref 0.7–4.0)
Lymphocytes Relative: 15.5 % (ref 12.0–46.0)
MCHC: 34.6 g/dL (ref 30.0–36.0)
MCV: 96.5 fl (ref 78.0–100.0)
MONO ABS: 0.5 10*3/uL (ref 0.1–1.0)
MONOS PCT: 8.9 % (ref 3.0–12.0)
NEUTROS PCT: 73.7 % (ref 43.0–77.0)
Neutro Abs: 3.7 10*3/uL (ref 1.4–7.7)
Platelets: 182 10*3/uL (ref 150.0–400.0)
RBC: 4.67 Mil/uL (ref 4.22–5.81)
RDW: 13.1 % (ref 11.5–15.5)
WBC: 5.1 10*3/uL (ref 4.0–10.5)

## 2018-05-30 LAB — COMPREHENSIVE METABOLIC PANEL
ALBUMIN: 4.6 g/dL (ref 3.5–5.2)
ALK PHOS: 55 U/L (ref 39–117)
ALT: 35 U/L (ref 0–53)
AST: 24 U/L (ref 0–37)
BILIRUBIN TOTAL: 0.8 mg/dL (ref 0.2–1.2)
BUN: 16 mg/dL (ref 6–23)
CO2: 31 mEq/L (ref 19–32)
CREATININE: 1.09 mg/dL (ref 0.40–1.50)
Calcium: 9.9 mg/dL (ref 8.4–10.5)
Chloride: 100 mEq/L (ref 96–112)
GFR: 71.55 mL/min (ref >60.00)
GLUCOSE: 92 mg/dL (ref 70–99)
Potassium: 4.7 mEq/L (ref 3.5–5.1)
SODIUM: 137 meq/L (ref 135–145)
Total Protein: 6.7 g/dL (ref 6.0–8.3)

## 2018-05-30 LAB — B12 AND FOLATE PANEL
FOLATE: 14.6 ng/mL (ref >5.9)
VITAMIN B 12: 347 pg/mL (ref 211–911)

## 2018-05-30 NOTE — Telephone Encounter (Signed)
Pt calling with c/o of dizziness and lightheadedness. Pt stated that he has had to cut back on activities due to the dizziness. Pt stated that he has had occasional dizziness for years but his symptoms have worsened in the past 3 weeks. Pt states the dizziness is worse later in the afternoon. Pt states that his heart rate stays in the 70's range. Pt stated that when he goes to bed he is having "PVC's" Pt does not know what is causing his dizziness. Pt states that the last time he had dizziness was yesterday. Pt stated that he has to grab onto something to keep his balance. Care advice given to pt and pt verbalized understanding. Pt given appt for tomorrow with Elyn Aquas PA tomorrow at 9:00 am.   Reason for Disposition . [1] MODERATE dizziness (e.g., interferes with normal activities) AND [2] has NOT been evaluated by physician for this  (Exception: dizziness caused by heat exposure, sudden standing, or poor fluid intake)  Answer Assessment - Initial Assessment Questions 1. DESCRIPTION: "Describe your dizziness."     Out of balance trouble focusing eyes  2. LIGHTHEADED: "Do you feel lightheaded?" (e.g., somewhat faint, woozy, weak upon standing)     yes 3. VERTIGO: "Do you feel like either you or the room is spinning or tilting?" (i.e. vertigo)     no 4. SEVERITY: "How bad is it?"  "Do you feel like you are going to faint?" "Can you stand and walk?"   - MILD - walking normally   - MODERATE - interferes with normal activities (e.g., work, school)    - SEVERE - unable to stand, requires support to walk, feels like passing out now.      moderate 5. ONSET:  "When did the dizziness begin?"      Has had occasional dizziness for 2 years and has gotten worse in the past 3 weeks 6. AGGRAVATING FACTORS: "Does anything make it worse?" (e.g., standing, change in head position)     Worse later in the afternoon- standing or changing head position 7. HEART RATE: "Can you tell me your heart rate?" "How many  beats in 15 seconds?"  (Note: not all patients can do this)      Pt states it is 70 most of the time 8. CAUSE: "What do you think is causing the dizziness?"     Pt does not know 9. RECURRENT SYMPTOM: "Have you had dizziness before?" If so, ask: "When was the last time?" "What happened that time?"     Yes- yesterday- had to grab onto something to keep balance 10. OTHER SYMPTOMS: "Do you have any other symptoms?" (e.g., fever, chest pain, vomiting, diarrhea, bleeding)       Feels occasional palpitations at night before he goes to sleep 11. PREGNANCY: "Is there any chance you are pregnant?" "When was your last menstrual period?"       n/a  Protocols used: DIZZINESS [facility]

## 2018-05-30 NOTE — Telephone Encounter (Signed)
[name] Brown, [place] has openings for this afternoon, so I called patient to see if he would like to be seen today. He stated that he would, so I scheduled appointment for 1:45.   Verified with patient that he was okay to come to office - this has been going on for a while now, and patient states that he is okay to drive, but he was thankful that I called to get him in today.  Routed to provider as Juluis Rainier

## 2018-05-30 NOTE — Progress Notes (Signed)
Patient presents to clinic today c/o episodes of feeling off-balance that have been present for the past 1.5-2 years, typically occurring 3-4 times per year, but has increased in severity and frequency over the past 3-4 weeks. Now notes this sensation almost on a daily basis. Denies true vertigo or lightheadedness, just an off-balance feeling. Denies ear pressure or acute change in hearing. Denies headache, photophobia, phonophobia or vision changes. Denies focal or generalized weakness. Has been able to go to the gym several times per week without issue (does stair climber, treadmill, stationary bike and sometimes resistance training). Has history of PAF and PVCs but denies palpitations, chest pain or SOB.   Past Medical History:  Diagnosis Date  . Aortic stenosis   . Colon cancer ([place])   . Family history of adverse reaction to anesthesia    pt states his mother had 2 episodes of hyponatremia following anesthesia   . GERD (gastroesophageal reflux disease)   . Headache   . Heart murmur   . Hyperlipidemia 10/25/2016  . Hypertension    pt is currently not taking any medications; pt states is borderline  . Melanoma ([place])   . PAF (paroxysmal atrial fibrillation) ([place]) 06/13/2017  . Perforated sigmoid colon ([place])   . PVC's (premature ventricular contractions) 10/25/2016  . Tinnitus   . Wears glasses     Current Outpatient Medications on File Prior to Visit  Medication Sig Dispense Refill  . aspirin EC 81 MG tablet Take 81 mg by mouth daily.    [name] ezetimibe (ZETIA) 10 MG tablet TAKE ONE TABLET BY MOUTH DAILY 90 tablet 2  . famotidine (PEPCID) 20 MG tablet Take 20 mg by mouth daily as needed for heartburn or indigestion.    [name] levothyroxine (SYNTHROID, LEVOTHROID) 50 MCG tablet TAKE ONE TABLET BY MOUTH DAILY 90 tablet 1  . losartan (COZAAR) 25 MG tablet TAKE ONE TABLET BY MOUTH DAILY 90 tablet 1  . metoprolol succinate (TOPROL-XL) 25 MG 24 hr tablet TAKE ONE TABLET BY MOUTH TWICE A DAY 180  tablet 0  . nitroGLYCERIN (NITRODUR - DOSED IN MG/24 HR) 0.2 mg/hr patch Place 1/4 to 1/2 of a patch over affected region. Remove and replace once daily.  Slightly alter skin placement daily 30 patch 1  . pravastatin (PRAVACHOL) 10 MG tablet TAKE ONE TABLET BY MOUTH DAILY 90 tablet 0  . sildenafil (VIAGRA) 50 MG tablet Take 1 tablet (50 mg total) by mouth daily as needed. 10 tablet 0  . traZODone (DESYREL) 50 MG tablet TAKE ONE-HALF TO ONE TABLET BY MOUTH EVERY NIGHT AT BEDTIME AS NEEDED FOR SLEEP 30 tablet 2   No current facility-administered medications on file prior to visit.     No Known Allergies  Family History  Problem Relation Age of Onset  . Dementia Mother   . Kidney disease Mother   . Vascular Disease Mother   . CAD Mother   . Heart attack Father   . Alcohol abuse Father   . Cirrhosis Father   . Stroke Maternal Grandmother   . Stroke Paternal Grandmother     Social History   Socioeconomic History  . Marital status: Married    Spouse name: Not on file  . Number of children: Not on file  . Years of education: Not on file  . Highest education level: Not on file  Occupational History  . Occupation: Retired Lawyer  . Financial resource strain: Not on file  . Food insecurity:  Worry: Never true    Inability: Never true  . Transportation needs:    Medical: No    Non-medical: No  Tobacco Use  . Smoking status: Never Smoker  . Smokeless tobacco: Never Used  Substance and Sexual Activity  . Alcohol use: Yes    Comment: 2-3 glasses of wine daily   . Drug use: No  . Sexual activity: Not on file  Lifestyle  . Physical activity:    Days per week: 4 days    Minutes per session: 30 min  . Stress: Not at all  Relationships  . Social connections:    Talks on phone: Not on file    Gets together: Not on file    Attends religious service: Not on file    Active member of club or organization: Not on file    Attends meetings of clubs or  organizations: Not on file    Relationship status: Married  Other Topics Concern  . Not on file  Social History Narrative   Pt lives in [place] with his wife, they are active.   Review of Systems - See HPI.  All other ROS are negative.  BP 130/80   Pulse 69   Temp 97.9 F (36.6 C) (Oral)   Resp 14   Ht _0  (1.676 m)   Wt 163 lb (73.9 kg)   SpO2 98%   BMI 26.31 kg/m   Physical Exam  Constitutional: He is oriented to person, place, and time. He appears well-developed and well-nourished.  HENT:  Head: Normocephalic and atraumatic.  Right Ear: External ear normal.  Left Ear: External ear normal.  Nose: Nose normal.  Mouth/Throat: Oropharynx is clear and moist.  Eyes: Pupils are equal, round, and reactive to light. Conjunctivae and EOM are normal.  Neck: Neck supple.  Cardiovascular: Normal rate, regular rhythm, normal heart sounds and intact distal pulses.  Pulmonary/Chest: Effort normal and breath sounds normal.  Neurological: He is alert and oriented to person, place, and time. He has normal strength. He displays normal reflexes. No cranial nerve deficit or sensory deficit. Coordination and gait normal.  Psychiatric: He has a normal mood and affect.  Vitals reviewed.  Recent Results (from the past 2160 hour(s))  TSH     Status: Abnormal   Collection Time: 03/28/18  9:22 AM  Result Value Ref Range   TSH 5.43 (H) 0.35 - 4.50 uIU/mL  CBC with Differential/Platelet     Status: None   Collection Time: 03/28/18  9:22 AM  Result Value Ref Range   WBC 4.2 4.0 - 10.5 K/uL   RBC 4.67 4.22 - 5.81 Mil/uL   Hemoglobin 15.7 13.0 - 17.0 g/dL   HCT 46.2 39.0 - 52.0 %   MCV 99.0 78.0 - 100.0 fl   MCHC 34.0 30.0 - 36.0 g/dL   RDW 13.2 11.5 - 15.5 %   Platelets 178.0 150.0 - 400.0 K/uL   Neutrophils Relative % 64.6 43.0 - 77.0 %   Lymphocytes Relative 22.0 12.0 - 46.0 %   Monocytes Relative 10.6 3.0 - 12.0 %   Eosinophils Relative 2.1 0.0 - 5.0 %   Basophils Relative 0.7 0.0  - 3.0 %   Neutro Abs 2.7 1.4 - 7.7 K/uL   Lymphs Abs 0.9 0.7 - 4.0 K/uL   Monocytes Absolute 0.4 0.1 - 1.0 K/uL   Eosinophils Absolute 0.1 0.0 - 0.7 K/uL   Basophils Absolute 0.0 0.0 - 0.1 K/uL  PSA, Medicare     Status: None  Collection Time: 03/28/18  9:22 AM  Result Value Ref Range   PSA 0.33 0.10 - 4.00 ng/ml    Comment: Test performed using Access Hybritech PSA Assay, a parmagnetic partical, chemiluminecent immunoassay.  TSH     Status: None   Collection Time: 05/01/18  9:10 AM  Result Value Ref Range   TSH 2.82 0.35 - 4.50 uIU/mL   Assessment/Plan: 1. Balance problem Exam unremarkable today. Chronic but deteriorating problem. Will check CBC, CMP and B12/Folate today. Urgent referral placed to Neurology for further assessment. Strict ER precautions reviewed with patient.  - Ambulatory referral to Neurology - B12 and Folate Panel - CBC w/Diff - Comp Met (CMET)   Leeanne Rio, PA-C

## 2018-05-30 NOTE — Patient Instructions (Signed)
Please go to the lab today for blood work.  I will call you with your results. We will alter treatment regimen(s) if indicated by your results.   I am setting you up with Neurology for further assessment of this intermittent but ongoing symptoms to make sure you get a full workup. Thankfully this does not seem cardiac in nature. Your testing today is essentially normal so we need further assessment.   ER for any worsening of symptoms.

## 2018-05-31 ENCOUNTER — Ambulatory Visit: Payer: Medicare Other | Admitting: Physician Assistant

## 2018-05-31 ENCOUNTER — Telehealth: Payer: Self-pay | Admitting: *Deleted

## 2018-05-31 ENCOUNTER — Encounter: Payer: Self-pay | Admitting: Neurology

## 2018-05-31 DIAGNOSIS — R42 Dizziness and giddiness: Secondary | ICD-10-CM

## 2018-05-31 DIAGNOSIS — R2689 Other abnormalities of gait and mobility: Secondary | ICD-10-CM

## 2018-05-31 NOTE — Telephone Encounter (Signed)
When I called patient about his lab results, he stated that [facility] could not get him in until mid-November and he does not want to wait that long.  I spoke with referral coordinator and she called [facility] and they have the same wait time.   Routing to provider to see if he would like to make this an urgent referral to get him in sooner or if Mid-November is okay.

## 2018-06-01 NOTE — Addendum Note (Signed)
Addended by: Katina Dung on: 06/01/2018 10:22 AM   Modules accepted: Orders

## 2018-06-01 NOTE — Telephone Encounter (Signed)
Ok to change referral priority to urgent to see if they will work him in sooner giving symptoms are worsening in frequency and severity.

## 2018-06-01 NOTE — Telephone Encounter (Signed)
Referral has been changed, referral coordinator has been notified of urgent referral.

## 2018-06-06 ENCOUNTER — Telehealth: Payer: Self-pay | Admitting: Neurology

## 2018-06-06 ENCOUNTER — Ambulatory Visit (INDEPENDENT_AMBULATORY_CARE_PROVIDER_SITE_OTHER): Payer: Medicare Other | Admitting: Neurology

## 2018-06-06 ENCOUNTER — Encounter: Payer: Self-pay | Admitting: Neurology

## 2018-06-06 VITALS — BP 141/84 | HR 72 | Ht 66.0 in | Wt 165.0 lb

## 2018-06-06 DIAGNOSIS — H81399 Other peripheral vertigo, unspecified ear: Secondary | ICD-10-CM | POA: Diagnosis not present

## 2018-06-06 DIAGNOSIS — R2689 Other abnormalities of gait and mobility: Secondary | ICD-10-CM | POA: Diagnosis not present

## 2018-06-06 DIAGNOSIS — R0683 Snoring: Secondary | ICD-10-CM | POA: Diagnosis not present

## 2018-06-06 DIAGNOSIS — R351 Nocturia: Secondary | ICD-10-CM | POA: Diagnosis not present

## 2018-06-06 DIAGNOSIS — Z952 Presence of prosthetic heart valve: Secondary | ICD-10-CM | POA: Diagnosis not present

## 2018-06-06 DIAGNOSIS — Z8679 Personal history of other diseases of the circulatory system: Secondary | ICD-10-CM

## 2018-06-06 DIAGNOSIS — R42 Dizziness and giddiness: Secondary | ICD-10-CM | POA: Diagnosis not present

## 2018-06-06 NOTE — Progress Notes (Signed)
Epworth Sleepiness Scale 0= would never doze 1= slight chance of dozing 2= moderate chance of dozing 3= high chance of dozing  Sitting and reading: 2 Watching TV: 2 Sitting inactive in a public place (ex. Theater or meeting): 2 As a passenger in a car for an hour without a break: 2 Lying down to rest in the afternoon: 3 Sitting and talking to someone: 0 Sitting quietly after lunch (no alcohol): 2 In a car, while stopped in traffic: 0 Total: 13

## 2018-06-06 NOTE — Patient Instructions (Addendum)
Unfortunately, dizziness is a very common complaint but is often not due to a primary neurological reason or single underlying medical problem. Often, there a combination of factors, that result in dizziness. This includes blood pressure fluctuations, medication side effects, blood sugar fluctuations, stress, vertigo, poor sleep with sleep deprivation, dehydration, and electrolyte disturbance or other metabolic and endocrinological reasons, meaning hormone related problems such as thyroid dysfunction. You have had appropriate recent blood work. Please try to stay well-hydrated with water and change positions slowly. We will investigate things further with a brain MRI. You do have some vascular risk factors. Please try to reduce your alcohol consumption, in particular as alcohol can cause balance problems and may also interact with some of your medications, particularly the trazodone. Alcohol can also be asleep disrupter, you do have some difficulty maintaining sleep. I would like to exclude sleep apnea and recommend a sleep study.

## 2018-06-06 NOTE — Progress Notes (Signed)
dSubjective:    Patient ID: [name] Brown is a 67 y.o. male.  HPI     Star Age, MD, PhD [facility] [address]O. [place], [place] [address]  Dear [name] Brown,   I saw your patient, [name], upon your kind request, in my neurologic clinic today for initial consultation of his balance problem. The patient is unaccompanied today. As you know, [name] as a 67 year old right-handed gentleman with an underlying medical history of aortic stenosis with heart murmur, hypertension, hyperlipidemia, history of melanoma, history of paroxysmal A. fib, reflux disease, history of perforated sigmoid colon, history of PVCs, tinnitus, and mildly overweight state, who reports a several month history of a constant sense of dizziness, no actual vertigo reported, no recent falls thankfully. He has had multiple surgeries including melanoma excision, partial colectomy, colostomy closure, TEE wo cardioversion, and aortic valve replacement. He is a retired Software engineer, nonsmoker but drinks alcohol on a regular basis, about 21 to 24 drinks per week on average. He drinks caffeine in the form of coffee, 2 cups per day on average. He is working on reducing his alcohol intake. He wants to lose some weight and is striving for a BMI of 25 unless. Of note, he does not always sleep well, has sleep disruption, snores, wakes up to go to the bathroom about 2 or 3 times on an average night. He denies morning headaches. His dizziness started a few months ago and was gradual. A few years ago he had very rare symptoms and now he seems to have more constant symptoms. He denies any spinning sensation. He has a long-standing history of tinnitus and mild hearing loss. He has not seen ENT recently. Bedtime is between 11 and 11:30 PM, rise time is generally between 7 and 8. His Epworth sleepiness score is 13 out of 24, fatigue score is 26 out of 63.  His Past Medical History Is Significant  For: Past Medical History:  Diagnosis Date  . Aortic stenosis   . Colon cancer ([place])   . Family history of adverse reaction to anesthesia    pt states his mother had 2 episodes of hyponatremia following anesthesia   . GERD (gastroesophageal reflux disease)   . Headache   . Heart murmur   . Hyperlipidemia 10/25/2016  . Hypertension    pt is currently not taking any medications; pt states is borderline  . Melanoma ([place])   . PAF (paroxysmal atrial fibrillation) ([place]) 06/13/2017  . Perforated sigmoid colon ([place])   . PVC's (premature ventricular contractions) 10/25/2016  . Tinnitus   . Wears glasses     His Past Surgical History Is Significant For: Past Surgical History:  Procedure Laterality Date  . AORTIC VALVE REPLACEMENT N/A 06/08/2017   Procedure: AORTIC VALVE REPLACEMENT (AVR);  Surgeon: [name], MD;  Location: [facility];  Service: Open Heart Surgery;  Laterality: N/A;  Using 90mm Perimount Magna Ease Pericardial Bioprosthesis Aortic Valve  . COLOSTOMY CLOSURE N/A 08/18/2016   Procedure: COLOSTOMY CLOSURE OPEN PROCEDURE;  Surgeon: Armandina Gemma, MD;  Location: [facility];  Service: General;  Laterality: N/A;  . LAPAROTOMY N/A 04/25/2016   Procedure: EXPLORATORY LAPAROTOMY WITH SIGMOID COLECTOMY, COLOSTOMY;  Surgeon: Armandina Gemma, MD;  Location: [facility];  Service: General;  Laterality: N/A;  . LEFT HEART CATH AND CORONARY ANGIOGRAPHY N/A 11/03/2016   Procedure: Left Heart Cath and Coronary Angiography;  Surgeon: Burnell Blanks, MD;  Location: [facility];  Service: Cardiovascular;  Laterality: N/A;  . MELANOMA EXCISION    . PARTIAL COLECTOMY Right 08/18/2016   Procedure: RIGHT COLECTOMY;  Surgeon: Armandina Gemma, MD;  Location: [facility];  Service: General;  Laterality: Right;  . TEE WITHOUT CARDIOVERSION N/A 06/08/2017   Procedure: TRANSESOPHAGEAL ECHOCARDIOGRAM (TEE);  Surgeon: [name], MD;  Location: [facility];  Service: Open Heart Surgery;  Laterality: N/A;    His Family  History Is Significant For: Family History  Problem Relation Age of Onset  . Dementia Mother   . Kidney disease Mother   . Vascular Disease Mother   . CAD Mother   . Heart attack Father   . Alcohol abuse Father   . Cirrhosis Father   . Stroke Maternal Grandmother   . Stroke Paternal Grandmother     His Social History Is Significant For: Social History   Socioeconomic History  . Marital status: Married    Spouse name: Not on file  . Number of children: Not on file  . Years of education: Not on file  . Highest education level: Not on file  Occupational History  . Occupation: Retired Lawyer  . Financial resource strain: Not on file  . Food insecurity:    Worry: Never true    Inability: Never true  . Transportation needs:    Medical: No    Non-medical: No  Tobacco Use  . Smoking status: Never Smoker  . Smokeless tobacco: Never Used  Substance and Sexual Activity  . Alcohol use: Yes    Alcohol/week: 21.0 - 24.0 standard drinks    Types: 21 - 24 Standard drinks or equivalent per week    Comment: 2-3 glasses of wine daily   . Drug use: No  . Sexual activity: Not on file  Lifestyle  . Physical activity:    Days per week: 4 days    Minutes per session: 30 min  . Stress: Not at all  Relationships  . Social connections:    Talks on phone: Not on file    Gets together: Not on file    Attends religious service: Not on file    Active member of club or organization: Not on file    Attends meetings of clubs or organizations: Not on file    Relationship status: Married  Other Topics Concern  . Not on file  Social History Narrative   Pt lives in [place] with his wife, they are active.      Pt lives at home with his wife.    His Allergies Are:  No Known Allergies:   His Current Medications Are:  Outpatient Encounter Medications as of 06/06/2018  Medication Sig  . aspirin EC 81 MG tablet Take 81 mg by mouth daily.  [name] ezetimibe (ZETIA) 10 MG  tablet TAKE ONE TABLET BY MOUTH DAILY  . famotidine (PEPCID) 20 MG tablet Take 20 mg by mouth daily as needed for heartburn or indigestion.  [name] levothyroxine (SYNTHROID, LEVOTHROID) 50 MCG tablet TAKE ONE TABLET BY MOUTH DAILY  . losartan (COZAAR) 25 MG tablet TAKE ONE TABLET BY MOUTH DAILY  . metoprolol succinate (TOPROL-XL) 25 MG 24 hr tablet TAKE ONE TABLET BY MOUTH TWICE A DAY  . nitroGLYCERIN (NITRODUR - DOSED IN MG/24 HR) 0.2 mg/hr patch Place 1/4 to 1/2 of a patch over affected region. Remove and replace once daily.  Slightly alter skin placement daily  . pravastatin (PRAVACHOL) 10 MG tablet TAKE ONE TABLET BY MOUTH DAILY  . sildenafil (VIAGRA) 50 MG  tablet Take 1 tablet (50 mg total) by mouth daily as needed.  . traZODone (DESYREL) 50 MG tablet TAKE ONE-HALF TO ONE TABLET BY MOUTH EVERY NIGHT AT BEDTIME AS NEEDED FOR SLEEP   No facility-administered encounter medications on file as of 06/06/2018.   : Review of Systems:  Out of a complete 14 point review of systems, all are reviewed and negative with the exception of these symptoms as listed below:  Review of Systems  Neurological:       Pt presents today to discuss his dizziness. Pt is dizziness has worsened over the past 3-4 weeks. Pt describes it as a continual, low-grade dizziness that has not caused any falls.    Objective:  Neurological Exam  Physical Exam Physical Examination:   Vitals:   06/06/18 0917  BP: (!) 141/84  Pulse: 72   On orthostatic testing: Blood pressure and pulse was 146/82 with a pulse of 66, sitting: 141/84 with a pulse of 72, standing: 130/87 with a pulse of 69. He did not have any change in his symptoms with position changes, no vertiginous symptoms.  General Examination: The patient is a very pleasant 67 y.o. male in no acute distress. He appears well-developed and well-nourished and well groomed.   HEENT: Normocephalic, atraumatic, pupils are equal, round and reactive to light and accommodation.  Corrective eyeglasses in place. Extraocular tracking is good without limitation to gaze excursion or nystagmus noted. Normal smooth pursuit is noted. Hearing is grossly intact. Face is symmetric with normal facial animation and normal facial sensation. Speech is clear with no dysarthria noted. There is no hypophonia. There is no lip, neck/head, jaw or voice tremor. Neck is supple with full range of passive and active motion. There are no carotid bruits on auscultation. Oropharynx exam reveals: moderate mouth dryness, adequate dental hygiene and moderate airway crowding, due to smaller airway entry and larger uvula, tonsils in place, not fully visualized, on the smaller side overall. Mallampati is class III. Neck circumference is 16 inches.he has a minimal overbite. Tongue protrudes centrally and palate elevates symmetrically.  Chest: Clear to auscultation without wheezing, rhonchi or crackles noted.  Heart: S1+S2+0, regular with prominent second heart sound and 2/6 systolic murmur noted.   Abdomen: Soft, non-tender and non-distended with normal bowel sounds appreciated on auscultation.  Extremities: There is no pitting edema in the distal lower extremities bilaterally. Pedal pulses are intact.  Skin: Warm and dry without trophic changes noted.  Musculoskeletal: exam reveals no obvious joint deformities, tenderness or joint swelling or erythema.   Neurologically:  Mental status: The patient is awake, alert and oriented in all 4 spheres. His immediate and remote memory, attention, language skills and fund of knowledge are appropriate. There is no evidence of aphasia, agnosia, apraxia or anomia. Speech is clear with normal prosody and enunciation. Thought process is linear. Mood is normal and affect is normal.  Cranial nerves II - XII are as described above under HEENT exam. In addition: shoulder shrug is normal with equal shoulder height noted. Motor exam: Normal bulk, strength and tone is noted.  There is no drift, tremor or rebound. Romberg is negative. Reflexes are 1+ throughout. Fine motor skills and coordination: intact with normal finger taps, normal hand movements, normal rapid alternating patting, normal foot taps and normal foot agility.  Cerebellar testing: No dysmetria or intention tremor on finger to nose testing. Heel to shin is unremarkable bilaterally. There is no truncal or gait ataxia.  Sensory exam: intact to light touch in  the upper and lower extremities.  Gait, station and balance: He stands easily. No veering to one side is noted. No leaning to one side is noted. Posture is age-appropriate and stance is narrow based. Gait shows normal stride length and normal pace. No problems turning are noted. Tandem walk is unremarkable.   Assessment and Plan:   In summary, Elmore Hyslop is a very pleasant 67 y.o.-year old male with an underlying medical history of aortic stenosis with heart murmur, hypertension, hyperlipidemia, history of melanoma, history of paroxysmal A. fib, reflux disease, history of perforated sigmoid colon, history of PVCs, tinnitus, and mildly overweight state, who presents for evaluation of his dizziness. He presents with a few month history of a constant sense of imbalance and continuous dizzy feeling, no actual vertigo. He does have a history of tinnitus and hearing loss but has not seen ENT yet. He may benefit from seeing ENT. From the neurological standpoint he has a largely nonfocal exam but does have a crowded airway.he had a little bit of a drop in systolic blood pressure and pulse with orthostatic testing but not telltale for orthostatic hypotension and no significant symptom change. He has sleep disturbance as well including snoring, sleep disruption, nocturia and daytime tiredness. I suggested we proceed with a brain MRI to rule out a structural cause of his symptoms. He does have vascular risk factors. His mother had vascular dementia he reports. In addition,  I would like to rule out obstructive sleep apnea as part of his symptomatology as sleep disturbance and lack of proper sleep consolidation and oxygen desaturations at night can cause symptoms during the day including vague symptoms such as dizziness. He also has a history of paroxysmal A. Fib. I would like to proceed with a brain MRI and sleep study testing. From the symptomatic treatment standpoint, he is encouraged to stay well rested, well-hydrated, change positions slowly and reduced his alcohol intake. I will see him back after testing is completed, we will also keep them posted in the interim as to his test results. I answered all his questions today and he was in agreement.   Thank you very much for allowing me to participate in the care of this nice patient. If I can be of any further assistance to you please do not hesitate to call me at [phone].  Sincerely,   Star Age, MD, PhD

## 2018-06-06 NOTE — Telephone Encounter (Signed)
Medicare/[employer] supp order sent to GI. They will reach out to the pt to schedule.

## 2018-06-08 DIAGNOSIS — L821 Other seborrheic keratosis: Secondary | ICD-10-CM | POA: Diagnosis not present

## 2018-06-08 DIAGNOSIS — D2272 Melanocytic nevi of left lower limb, including hip: Secondary | ICD-10-CM | POA: Diagnosis not present

## 2018-06-08 DIAGNOSIS — D485 Neoplasm of uncertain behavior of skin: Secondary | ICD-10-CM | POA: Diagnosis not present

## 2018-06-08 DIAGNOSIS — L812 Freckles: Secondary | ICD-10-CM | POA: Diagnosis not present

## 2018-06-08 DIAGNOSIS — Z85820 Personal history of malignant melanoma of skin: Secondary | ICD-10-CM | POA: Diagnosis not present

## 2018-06-08 DIAGNOSIS — D2271 Melanocytic nevi of right lower limb, including hip: Secondary | ICD-10-CM | POA: Diagnosis not present

## 2018-06-08 DIAGNOSIS — D225 Melanocytic nevi of trunk: Secondary | ICD-10-CM | POA: Diagnosis not present

## 2018-06-08 DIAGNOSIS — D2261 Melanocytic nevi of right upper limb, including shoulder: Secondary | ICD-10-CM | POA: Diagnosis not present

## 2018-06-08 DIAGNOSIS — D2262 Melanocytic nevi of left upper limb, including shoulder: Secondary | ICD-10-CM | POA: Diagnosis not present

## 2018-06-09 DIAGNOSIS — Z23 Encounter for immunization: Secondary | ICD-10-CM | POA: Diagnosis not present

## 2018-06-18 ENCOUNTER — Ambulatory Visit
Admission: RE | Admit: 2018-06-18 | Discharge: 2018-06-18 | Disposition: A | Discharge status: Home / Self Care | Payer: Medicare Other | Source: Ambulatory Visit | Attending: Neurology | Admitting: Neurology

## 2018-06-18 ENCOUNTER — Telehealth: Payer: Self-pay

## 2018-06-18 DIAGNOSIS — R2689 Other abnormalities of gait and mobility: Secondary | ICD-10-CM

## 2018-06-18 DIAGNOSIS — Z952 Presence of prosthetic heart valve: Secondary | ICD-10-CM

## 2018-06-18 DIAGNOSIS — R351 Nocturia: Secondary | ICD-10-CM

## 2018-06-18 DIAGNOSIS — R0683 Snoring: Secondary | ICD-10-CM

## 2018-06-18 DIAGNOSIS — R42 Dizziness and giddiness: Secondary | ICD-10-CM | POA: Diagnosis not present

## 2018-06-18 DIAGNOSIS — Z8679 Personal history of other diseases of the circulatory system: Secondary | ICD-10-CM

## 2018-06-18 MED ORDER — GADOBENATE DIMEGLUMINE 529 MG/ML IV SOLN
15.0000 mL | Freq: Once | INTRAVENOUS | Status: AC | PRN
  Administered 2018-06-18: 15 mL via INTRAVENOUS

## 2018-06-18 NOTE — Telephone Encounter (Signed)
-----   Message from Star Age, MD sent at 06/18/2018  4:35 PM EDT ----- MRI brain shows age-appropriate findings, which is reassuring.  Proceed with sleep study as planned. Please update pt.  Michel Bickers

## 2018-06-18 NOTE — Telephone Encounter (Signed)
I called pt to discuss. No answer, left a message asking him to call me back. 

## 2018-06-18 NOTE — Progress Notes (Signed)
MRI brain shows age-appropriate findings, which is reassuring.  Proceed with sleep study as planned. Please update pt.  [name] Brown

## 2018-06-19 NOTE — Telephone Encounter (Signed)
Pt returned my call. He reports that he already saw his results on mychart and has no further questions and will proceed with the sleep study testing.

## 2018-06-19 NOTE — Telephone Encounter (Signed)
I called pt again to discuss. No answer, left a message asking him to call me back. 

## 2018-06-26 ENCOUNTER — Ambulatory Visit: Payer: Medicare Other | Admitting: Sports Medicine

## 2018-07-02 ENCOUNTER — Ambulatory Visit (INDEPENDENT_AMBULATORY_CARE_PROVIDER_SITE_OTHER): Payer: Medicare Other | Admitting: Neurology

## 2018-07-02 DIAGNOSIS — R0683 Snoring: Secondary | ICD-10-CM

## 2018-07-02 DIAGNOSIS — G4720 Circadian rhythm sleep disorder, unspecified type: Secondary | ICD-10-CM

## 2018-07-02 DIAGNOSIS — R351 Nocturia: Secondary | ICD-10-CM

## 2018-07-02 DIAGNOSIS — R42 Dizziness and giddiness: Secondary | ICD-10-CM

## 2018-07-02 DIAGNOSIS — G4761 Periodic limb movement disorder: Secondary | ICD-10-CM

## 2018-07-02 DIAGNOSIS — R9431 Abnormal electrocardiogram [ECG] [EKG]: Secondary | ICD-10-CM

## 2018-07-02 DIAGNOSIS — G4733 Obstructive sleep apnea (adult) (pediatric): Secondary | ICD-10-CM

## 2018-07-02 DIAGNOSIS — R2689 Other abnormalities of gait and mobility: Secondary | ICD-10-CM

## 2018-07-02 DIAGNOSIS — Z952 Presence of prosthetic heart valve: Secondary | ICD-10-CM

## 2018-07-02 DIAGNOSIS — Z8679 Personal history of other diseases of the circulatory system: Secondary | ICD-10-CM

## 2018-07-12 NOTE — Procedures (Signed)
PATIENT'S NAME:  [name] Brown, [name] Brown DOB:      Mar 04, 1951      MR#:    [id number]     DATE OF RECORDING: 07/02/2018 REFERRING M.D.:  Raiford Noble, PA Study Performed:   Baseline Polysomnogram HISTORY: 67 year old man with a history of aortic stenosis with heart murmur, hypertension, hyperlipidemia, history of melanoma, history of paroxysmal A. fib, reflux disease, history of perforated sigmoid colon, history of PVCs, tinnitus, and mildly overweight state, who reports a several month history of a constant sense of dizziness, snoring and EDS. The patient endorsed the Epworth Sleepiness Scale at 13 points. The patient's weight 165 pounds with a height of 66 (inches), resulting in a BMI of 26.6 kg/m2. The patient's neck circumference measured 16 inches.  CURRENT MEDICATIONS: Aspirin, Zetia, Pepcid, Synthroid, Cozaar, Toprol, Nitroglycerin, Pravachol, Viagra, Desyrel.    PROCEDURE:  This is a multichannel digital polysomnogram utilizing the Somnostar 11.2 system.  Electrodes and sensors were applied and monitored per AASM Specifications.   EEG, EOG, Chin and Limb EMG, were sampled at 200 Hz.  ECG, Snore and Nasal Pressure, Thermal Airflow, Respiratory Effort, CPAP Flow and Pressure, Oximetry was sampled at 50 Hz. Digital video and audio were recorded.      BASELINE STUDY  Lights Out was at 22:40 and Lights On at 05:00.  Total recording time (TRT) was 380 minutes, with a total sleep time (TST) of 312 minutes. The patient's sleep latency was 32.5 minutes.  REM latency was 87 minutes. The sleep efficiency was 82.1%.     SLEEP ARCHITECTURE: WASO (Wake after sleep onset) was 39 minutes with mild sleep fragmentation noted.  There were 14.5 minutes in Stage N1, 88.5 minutes Stage N2, 169.5 minutes Stage N3 and 39.5 minutes in Stage REM.  The percentage of Stage N1 was 4.6%, Stage N2 was 28.4%, Stage N3 was 54.3%, which is markedly increased, and Stage R (REM sleep) was 12.7%, which is reduced. The arousals were noted  as: 5 were spontaneous, 32 were associated with PLMs, 4 were associated with respiratory events.   RESPIRATORY ANALYSIS:  There were a total of 47 respiratory events:  31 obstructive apneas, 1 central apneas and 0 mixed apneas with a total of 32 apneas and an apnea index (AI) of 6.2 /hour. There were 15 hypopneas with a hypopnea index of 2.9/hour. The patient also had 0 respiratory event related arousals (RERAs).      The total APNEA/HYPOPNEA INDEX (AHI) was 9/hour and the total RESPIRATORY DISTURBANCE INDEX was 9. /hour.  11 events occurred in REM sleep and 19 events in NREM. The REM AHI was 16.7/hour, versus a non-REM AHI of 7.9. The patient spent 0 minutes of total sleep time in the supine position and 312 minutes in non-supine. The supine AHI was n/a versus a non-supine AHI of 9.1.  OXYGEN SATURATION & C02:  The Wake baseline 02 saturation was 95%, with the lowest being 73%. Time spent below 89% saturation equaled 6 minutes.  PERIODIC LIMB MOVEMENTS: The patient had a total of 417 Periodic Limb Movements.  The Periodic Limb Movement (PLM) index was 80.2 and the PLM Arousal index was 6.2/hour.  Audio and video analysis did not show any abnormal or unusual movements, behaviors, phonations or vocalizations. The patient took 1 bathroom break. Mild snoring was noted. The EKG was in keeping with normal sinus rhythm (NSR) with rare PVCs noted.   Post-study, the patient indicated that sleep was the same as usual.   IMPRESSION: 1. Obstructive Sleep Apnea (  OSA) 2. Periodic Limb Movement Disorder (PLMD) 3. Dysfunctions associated with sleep stages or arousal from sleep 4. Non-specific abnormal EKG  RECOMMENDATIONS: 1. This study demonstrates overall mild obstructive sleep apnea, moderate in REM sleep with a total AHI of 9/hour, REM AHI of 16.7/hour, and O2 nadir of 73%, which is in the severe range. The absence of supine sleep may result in underestimation of his sleep disordered breathing. Given the  patient's medical history and sleep related complaints, treatment with positive airway pressure is recommended; a full-night CPAP titration study is advised, to optimize therapy. Other treatment options may include avoidance of supine sleep position along with weight loss, upper airway or jaw surgery in selected patients or the use of an oral appliance in certain patients. ENT evaluation and/or consultation with a maxillofacial surgeon or dentist may be feasible in some instances.    2. This study shows sleep fragmentation and abnormal sleep stage percentages; these are nonspecific findings and per se do not signify an intrinsic sleep disorder or a cause for the patient's sleep-related symptoms. Causes include (but are not limited to) the first night effect of the sleep study, circadian rhythm disturbances, medication effect or an underlying mood disorder or medical problem.  3. Severe PLMs (periodic limb movements of sleep) were noted during this study with mild arousals; clinical correlation is recommended. The study showed PVCs on single lead EKG; clinical correlation is recommended.  4. The patient should be cautioned not to drive, work at heights, or operate dangerous or heavy equipment when tired or sleepy. Review and reiteration of good sleep hygiene measures should be pursued with any patient. 5. The patient will be seen in follow-up in the sleep clinic at [facility] for discussion of the test results, symptom and treatment compliance review, further management strategies, etc. The referring provider will be notified of the test results.  I certify that I have reviewed the entire raw data recording prior to the issuance of this report in accordance with the Standards of Accreditation of the American Academy of Sleep Medicine (AASM)  Star Age, MD, PhD Diplomat, American Board of Neurology and Sleep Medicine (Neurology and Sleep Medicine)

## 2018-07-12 NOTE — Addendum Note (Signed)
Addended by: Star Age on: 07/12/2018 08:33 AM   Modules accepted: Orders

## 2018-07-12 NOTE — Progress Notes (Signed)
Patient referred by [name] Noble, PA, seen by me on 06/06/18, diagnostic PSG on 07/02/18.   Please call and notify the patient that the recent sleep study showed overall mild obstructive sleep apnea, moderate in REM sleep with a total AHI of 9/hour, REM AHI of 16.7/hour, and O2 nadir of 73%, which is in the severe range. The absence of supine sleep may have resulted in underestimation of his sleep disordered breathing.  I recommend treatment for his OSA in the form of CPAP, esp in light of his cardiac Hx and dizziness. This will require a repeat sleep study for proper titration and mask fitting and correct monitoring of the oxygen saturations. Please explain to patient. I have placed an order in the chart. Thanks.  Star Age, MD, PhD [facility])

## 2018-07-13 ENCOUNTER — Telehealth: Payer: Self-pay

## 2018-07-13 NOTE — Telephone Encounter (Signed)
I called pt. I advised pt that Dr. Rexene Alberts reviewed their sleep study results and found that pt has overall mild osa, moderate in REM sleep with an O2 nadir of 73% and recommends that pt be treated with a cpap. Dr. Rexene Alberts recommends that pt return for a repeat sleep study in order to properly titrate the cpap and ensure a good mask fit. Pt is agreeable to returning for a titration study. I advised pt that our sleep lab will file with pt's insurance and call pt to schedule the sleep study when we hear back from the pt's insurance regarding coverage of this sleep study. Pt verbalized understanding of results. Pt had no questions at this time but was encouraged to call back if questions arise.

## 2018-07-13 NOTE — Telephone Encounter (Signed)
-----   Message from Star Age, MD sent at 07/12/2018  8:32 AM EDT ----- Patient referred by [name] Noble, PA, seen by me on 06/06/18, diagnostic PSG on 07/02/18.   Please call and notify the patient that the recent sleep study showed overall mild obstructive sleep apnea, moderate in REM sleep with a total AHI of 9/hour, REM AHI of 16.7/hour, and O2 nadir of 73%, which is in the severe range. The absence of supine sleep may have resulted in underestimation of his sleep disordered breathing.  I recommend treatment for his OSA in the form of CPAP, esp in light of his cardiac Hx and dizziness. This will require a repeat sleep study for proper titration and mask fitting and correct monitoring of the oxygen saturations. Please explain to patient. I have placed an order in the chart. Thanks.  Star Age, MD, PhD [facility])

## 2018-07-25 ENCOUNTER — Ambulatory Visit (INDEPENDENT_AMBULATORY_CARE_PROVIDER_SITE_OTHER): Payer: Medicare Other | Admitting: Neurology

## 2018-07-25 ENCOUNTER — Other Ambulatory Visit: Payer: Self-pay | Admitting: Family Medicine

## 2018-07-25 ENCOUNTER — Other Ambulatory Visit: Payer: Self-pay | Admitting: Cardiovascular Disease

## 2018-07-25 DIAGNOSIS — R42 Dizziness and giddiness: Secondary | ICD-10-CM

## 2018-07-25 DIAGNOSIS — R351 Nocturia: Secondary | ICD-10-CM

## 2018-07-25 DIAGNOSIS — I10 Essential (primary) hypertension: Secondary | ICD-10-CM

## 2018-07-25 DIAGNOSIS — G4720 Circadian rhythm sleep disorder, unspecified type: Secondary | ICD-10-CM

## 2018-07-25 DIAGNOSIS — G4733 Obstructive sleep apnea (adult) (pediatric): Secondary | ICD-10-CM | POA: Diagnosis not present

## 2018-07-25 DIAGNOSIS — R9431 Abnormal electrocardiogram [ECG] [EKG]: Secondary | ICD-10-CM

## 2018-07-25 DIAGNOSIS — G4761 Periodic limb movement disorder: Secondary | ICD-10-CM

## 2018-07-25 DIAGNOSIS — Z8679 Personal history of other diseases of the circulatory system: Secondary | ICD-10-CM

## 2018-07-25 DIAGNOSIS — Z952 Presence of prosthetic heart valve: Secondary | ICD-10-CM

## 2018-07-25 DIAGNOSIS — R2689 Other abnormalities of gait and mobility: Secondary | ICD-10-CM

## 2018-07-26 ENCOUNTER — Ambulatory Visit: Payer: Medicare Other | Admitting: Neurology

## 2018-07-26 ENCOUNTER — Encounter

## 2018-07-31 ENCOUNTER — Telehealth: Payer: Self-pay

## 2018-07-31 NOTE — Telephone Encounter (Signed)
Pt returned my call. I advised pt that Dr. Rexene Alberts reviewed their sleep study results and found that pt did well with the cpap during his latest sleep study. Dr. Rexene Alberts recommends that pt start a cpap at home. I reviewed PAP compliance expectations with the pt. Pt is agreeable to starting a CPAP. I advised pt that an order will be sent to a DME, [facility], and [facility] will call the pt within about one week after they file with the pt's insurance. [facility] will show the pt how to use the machine, fit for masks, and troubleshoot the CPAP if needed. Pt declined making a follow up at this time because he is in the car. He will call back today and schedule an appt with Hoyle Sauer, NP in February of 2020.  Pt verbalized understanding of results. Pt had no questions at this time but was encouraged to call back if questions arise. I have sent the order to [facility] and have received confirmation that they have received the order.

## 2018-07-31 NOTE — Telephone Encounter (Signed)
I called pt to discuss his results, no answer, left a message asking him to call me back.

## 2018-07-31 NOTE — Procedures (Signed)
S PATIENT'S NAME:  [name] Brown, [name] Brown DOB:      1951-03-14      MR#:    [id number]     DATE OF RECORDING: 07/25/2018 REFERRING M.D.:  [name] MD Study Performed:   CPAP  Titration HISTORY: 67 year old man with a history of aortic stenosis, HTN, HLP, melanoma, PAF, reflux disease, history of perforated sigmoid colon, history of PVCs, tinnitus, and mildly overweight state, who returns for a full night titration study to treat his OSA. His baseline sleep study from 07/02/18 showed an AHI of 9 and low spo2 of 73%. The patient endorsed the Epworth Sleepiness Scale at 13 points, BMI of 26.6 kg/m2.   CURRENT MEDICATIONS: Aspirin, Zetia, Pepcid, Synthroid, Cozaar, Toprol, Nitroglycerin, Pravachol, Viagra, Desyrel.    PROCEDURE:  This is a multichannel digital polysomnogram utilizing the SomnoStar 11.2 system.  Electrodes and sensors were applied and monitored per AASM Specifications.   EEG, EOG, Chin and Limb EMG, were sampled at 200 Hz.  ECG, Snore and Nasal Pressure, Thermal Airflow, Respiratory Effort, CPAP Flow and Pressure, Oximetry was sampled at 50 Hz. Digital video and audio were recorded.      The patient was fitted with a medium N20 nasal mask. CPAP was initiated at 5 cmH20 with heated humidity per AASM standards and pressure was advanced to 9 cmH20 because of hypopneas, apneas and desaturations.  At a PAP pressure of 9 cmH20, there was a reduction of the AHI to 0 with non-supine REM sleep achieved and O2 nadir of 93%.   Lights Out was at 22:31 and Lights On at 04:59. Total recording time (TRT) was 388.5 minutes, with a total sleep time (TST) of 291 minutes. The patient's sleep latency was 14 minutes. REM latency was 209.5 minutes, which is markedly delayed. The sleep efficiency was 74.9%, which is reduced.    SLEEP ARCHITECTURE: WASO (Wake after sleep onset) was 78.5 minutes with mild to moderate sleep fragmentation noted. There were 16.5 minutes in Stage N1, 56 minutes Stage N2, 190.5 minutes  Stage N3 and 28 minutes in Stage REM.  The percentage of Stage N1 was 5.7%, Stage N2 was 19.2%, Stage N3 was 65.5%, which is increased, and Stage R (REM sleep) was 9.6%, which is reduced. The arousals were noted as: 26 were spontaneous, 37 were associated with PLMs, 0 were associated with respiratory events.  RESPIRATORY ANALYSIS:  There was a total of 1 respiratory events: 0 obstructive apneas, 0 central apneas and 0 mixed apneas with a total of 0 apneas and an apnea index (AI) of 0 /hour. There were 1 hypopneas with a hypopnea index of .2/hour. The patient also had 0 respiratory event related arousals (RERAs).      The total APNEA/HYPOPNEA INDEX  (AHI) was .2 /hour and the total RESPIRATORY DISTURBANCE INDEX was .2 /hour  1 events occurred in REM sleep and 0 events in NREM. The REM AHI was 2.1 /hour versus a non-REM AHI of 0 /hour.  The patient spent 0 minutes of total sleep time in the supine position and 291 minutes in non-supine. The supine AHI was 0.0, versus a non-supine AHI of 0.2.  OXYGEN SATURATION & C02:  The baseline 02 saturation was 96%, with the lowest being 91%. Time spent below 89% saturation equaled 0 minutes.  PERIODIC LIMB MOVEMENTS:  The patient had a total of 430 Periodic Limb Movements. The Periodic Limb Movement (PLM) index was 88.7 and the PLM Arousal index was 7.6 /hour.  Audio and video analysis did not  show any abnormal or unusual movements, behaviors, phonations or vocalizations. The patient took 1 bathroom break. The EKG was in keeping with normal sinus rhythm (NSR) with rare PVCs noted. Post-study, the patient indicated that sleep was the same as usual.   IMPRESSION: 1. Obstructive Sleep Apnea (OSA) 2. Periodic Limb Movement Disorder (PLMD) 3. Dysfunctions associated with sleep stages or arousal from sleep 4. Non-specific abnormal EKG   RECOMMENDATIONS: 1. This study demonstrates resolution of the patient's obstructive sleep apnea with CPAP therapy. I will,  therefore, start the patient on home CPAP treatment at a pressure of 9 cm via medium nasal mask with heated humidity. The patient should be reminded to be fully compliant with PAP therapy to improve sleep related symptoms and decrease long term cardiovascular risks. The patient should be reminded, that it may take up to 3 months to get fully used to using PAP with all planned sleep. The earlier full compliance is achieved, the better long term compliance tends to be. Please note that untreated obstructive sleep apnea may carry additional perioperative morbidity. Patients with significant obstructive sleep apnea should receive perioperative PAP therapy and the surgeons and particularly the anesthesiologist should be informed of the diagnosis and the severity of the sleep disordered breathing.  2. This study shows sleep fragmentation and abnormal sleep stage percentages; these are nonspecific findings and per se do not signify an intrinsic sleep disorder or a cause for the patient's sleep-related symptoms. Causes include (but are not limited to) the first night effect of the sleep study, circadian rhythm disturbances, medication effect or an underlying mood disorder or medical problem.  3. Severe PLMs (periodic limb movements of sleep) were noted during this study with mild arousals; clinical correlation is recommended.  4. The study showed PVCs on single lead EKG; clinical correlation is recommended.  5. The patient should be cautioned not to drive, work at heights, or operate dangerous or heavy equipment when tired or sleepy. Review and reiteration of good sleep hygiene measures should be pursued with any patient. 6. The patient will be seen in follow-up in the sleep clinic at [facility] for discussion of the test results, symptom and treatment compliance review, further management strategies, etc. The referring provider will be notified of the test results.   I certify that I have reviewed the entire raw data  recording prior to the issuance of this report in accordance with the Standards of Accreditation of the American Academy of Sleep Medicine (AASM)   Star Age, MD, PhD Diplomat, American Board of Neurology and Sleep Medicine (Neurology and Sleep Medicine)

## 2018-07-31 NOTE — Telephone Encounter (Signed)
-----   Message from Star Age, MD sent at 07/31/2018  7:53 AM EST ----- Patient referred by Raiford Noble, PA, seen by me on 06/06/18, diagnostic PSG on 07/02/18. Patient had a CPAP titration study on 07/25/18.  Please call and inform patient that I have entered an order for treatment with positive airway pressure (PAP) treatment for obstructive sleep apnea (OSA). He did well during the latest sleep study with CPAP. We will, therefore, arrange for a machine for home use through a DME (durable medical equipment) company of His choice; and I will see the patient back in follow-up in about 10 weeks. Please also explain to the patient that I will be looking out for compliance data, which can be downloaded from the machine (stored on an SD card, that is inserted in the machine) or via remote access through a modem, that is built into the machine. At the time of the followup appointment we will discuss sleep study results and how it is going with PAP treatment at home. Please advise patient to bring His machine at the time of the first FU visit, even though this is cumbersome. Bringing the machine for every visit after that will likely not be needed, but often helps for the first visit to troubleshoot if needed. Please re-enforce the importance of compliance with treatment and the need for [place] to monitor compliance data - often an insurance requirement and actually good feedback for the patient as far as how they are doing.  Also remind patient, that any interim PAP machine or mask issues should be first addressed with the DME company, as they can often help better with technical and mask fit issues. Please ask if patient has a preference regarding DME company.  Please also make sure, the patient has a follow-up appointment with me in about 10 weeks from the setup date, thanks. May see one of our nurse practitioners if needed for proper timing of the FU appointment.  Please fax or rout report to the referring  provider. Thanks,   Star Age, MD, PhD [facility])

## 2018-07-31 NOTE — Addendum Note (Signed)
Addended by: Star Age on: 07/31/2018 07:53 AM   Modules accepted: Orders

## 2018-08-30 ENCOUNTER — Ambulatory Visit (HOSPITAL_COMMUNITY): Payer: Medicare Other | Attending: Cardiology

## 2018-08-30 ENCOUNTER — Other Ambulatory Visit: Payer: Self-pay

## 2018-08-30 DIAGNOSIS — I5042 Chronic combined systolic (congestive) and diastolic (congestive) heart failure: Secondary | ICD-10-CM | POA: Diagnosis not present

## 2018-08-30 DIAGNOSIS — Z952 Presence of prosthetic heart valve: Secondary | ICD-10-CM | POA: Diagnosis not present

## 2018-09-18 ENCOUNTER — Ambulatory Visit (INDEPENDENT_AMBULATORY_CARE_PROVIDER_SITE_OTHER): Payer: Medicare Other | Admitting: Cardiovascular Disease

## 2018-09-18 ENCOUNTER — Encounter: Payer: Self-pay | Admitting: Cardiovascular Disease

## 2018-09-18 VITALS — BP 136/84 | HR 68 | Ht 66.0 in | Wt 167.8 lb

## 2018-09-18 DIAGNOSIS — I7121 Aneurysm of the ascending aorta, without rupture: Secondary | ICD-10-CM

## 2018-09-18 DIAGNOSIS — Z5181 Encounter for therapeutic drug level monitoring: Secondary | ICD-10-CM | POA: Diagnosis not present

## 2018-09-18 DIAGNOSIS — I10 Essential (primary) hypertension: Secondary | ICD-10-CM | POA: Diagnosis not present

## 2018-09-18 DIAGNOSIS — I712 Thoracic aortic aneurysm, without rupture: Secondary | ICD-10-CM | POA: Diagnosis not present

## 2018-09-18 DIAGNOSIS — I480 Paroxysmal atrial fibrillation: Secondary | ICD-10-CM

## 2018-09-18 DIAGNOSIS — E7800 Pure hypercholesterolemia, unspecified: Secondary | ICD-10-CM

## 2018-09-18 DIAGNOSIS — Z9989 Dependence on other enabling machines and devices: Secondary | ICD-10-CM

## 2018-09-18 DIAGNOSIS — Z952 Presence of prosthetic heart valve: Secondary | ICD-10-CM | POA: Diagnosis not present

## 2018-09-18 DIAGNOSIS — G4733 Obstructive sleep apnea (adult) (pediatric): Secondary | ICD-10-CM | POA: Insufficient documentation

## 2018-09-18 HISTORY — DX: Obstructive sleep apnea (adult) (pediatric): G47.33

## 2018-09-18 HISTORY — DX: Aneurysm of the ascending aorta, without rupture: I71.21

## 2018-09-18 HISTORY — DX: Thoracic aortic aneurysm, without rupture: I71.2

## 2018-09-18 MED ORDER — LOSARTAN POTASSIUM 50 MG PO TABS: 50.0000 mg | ORAL_TABLET | Freq: Every day | ORAL | 1 refills | Status: DC

## 2018-09-18 NOTE — Progress Notes (Signed)
Cardiology Office Note   Date:  09/18/2018   ID:  [name] Brown, DOB 07/07/1951, MRN [id number]  PCP:  [name], MD  Cardiologist:   Skeet Latch, MD   Chief Complaint  Patient presents with  . Follow-up     History of Present Illness: [name] Brown is a 68 y.o. male with non-obstructive CAD, chronic systolic and diastolic heart failure (LVEF 30-35% improved to 45-50%), aortic stenosis s/p bioprosthetic aortic valve, mild ascending aortic aneurysm, OSA on CPAP, hypothyroidism, and colon cancer s/p R colectomy who presents for follow up.  [name] Brown saw Dr. [name] on 09/23/16 and was noted to have coronary atherosclerosis on chest xray.  He had an echo 10/12/16 that revealed LVEF 30-35% with grade 1 diastolic dysfunction.  His aortic valve was heavily calcified and likely bicuspid.  Although the mean gradient was 14 mmHg, it was felt that he likely had moderate aortic stenosis given his reduced systolic function and leaflet restriction.   He was started on aspirin and referred to cardiology for evaluation.  He was referred for cardiac catheterization 10/2016 that revealed mild-mod CAD and LVEF 45-50%. [name] Brown was referred for a repeat echo 01/17/17 that revealed LVEF 30-35% with grade 1 diastolic dysfunction. There was fusion of the right and noncoronary cusps and leaflet mobility was severely restricted. The mean gradient was 16 mmHg.  He underwent AVR with a 37mm Edwards Magna-Ease pericardial valve 06/08/17.  His hospitalization was complicated by atrial fibrillation and he was started on amiodarone which was discontinued 3 months post-op.  [name] Brown was treated for colon cancer with surgery only and did not require chemotherapy.  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    Since his last appointment [name] Brown had an echo 08/2018 that showed an improvement in his LVEF 55 to 60%.  There is grade 1 diastolic dysfunction.   His bioprosthetic aortic valve is functioning well.  The ascending aorta was 4.3 cm.  This was increased from 3.8 cm the previous year.  He has been feeling well.  He continues to exercise 5 days/week.  He does cardio for an hour and lifts weights.  He has no exertional chest pain or shortness of breath.  He has no lower extremity edema, orthopnea, or PND.  He has been struggling with some difficulty falling and staying asleep.  He reports mental fuzziness and feeling tired throughout the day.  Who was recently diagnosed with sleep apnea and has been using a CPAP machine for 1 month.  This does not seem to help.  Since his last appointment he was also diagnosed with hypothyroidism and has started on levothyroxine without any improvement in his symptoms.  He has not been checking his blood pressure at home regularly lately, but he thinks it typically is in the 841L systolic.   Past Medical History:  Diagnosis Date  . Aortic stenosis   . Ascending aortic aneurysm ([place]) 09/18/2018  . Colon cancer ([place])   . Family history of adverse reaction to anesthesia    pt states his mother had 2 episodes of hyponatremia following anesthesia   . GERD (gastroesophageal reflux disease)   . Headache   . Heart murmur   . Hyperlipidemia 10/25/2016  . Hypertension    pt is currently not taking any medications; pt states is borderline  . Melanoma ([place])   . OSA on CPAP 09/18/2018  . PAF (paroxysmal atrial fibrillation) ([place]) 06/13/2017  .  Perforated sigmoid colon ([place])   . PVC's (premature ventricular contractions) 10/25/2016  . Tinnitus   . Wears glasses     Past Surgical History:  Procedure Laterality Date  . AORTIC VALVE REPLACEMENT N/A 06/08/2017   Procedure: AORTIC VALVE REPLACEMENT (AVR);  Surgeon: [name], MD;  Location: [facility];  Service: Open Heart Surgery;  Laterality: N/A;  Using 57mm Perimount Magna Ease Pericardial Bioprosthesis Aortic Valve  . COLOSTOMY CLOSURE N/A 08/18/2016   Procedure: COLOSTOMY  CLOSURE OPEN PROCEDURE;  Surgeon: Armandina Gemma, MD;  Location: [facility];  Service: General;  Laterality: N/A;  . LAPAROTOMY N/A 04/25/2016   Procedure: EXPLORATORY LAPAROTOMY WITH SIGMOID COLECTOMY, COLOSTOMY;  Surgeon: Armandina Gemma, MD;  Location: [facility];  Service: General;  Laterality: N/A;  . LEFT HEART CATH AND CORONARY ANGIOGRAPHY N/A 11/03/2016   Procedure: Left Heart Cath and Coronary Angiography;  Surgeon: Burnell Blanks, MD;  Location: [facility];  Service: Cardiovascular;  Laterality: N/A;  . MELANOMA EXCISION    . PARTIAL COLECTOMY Right 08/18/2016   Procedure: RIGHT COLECTOMY;  Surgeon: Armandina Gemma, MD;  Location: [facility];  Service: General;  Laterality: Right;  . TEE WITHOUT CARDIOVERSION N/A 06/08/2017   Procedure: TRANSESOPHAGEAL ECHOCARDIOGRAM (TEE);  Surgeon: [name], MD;  Location: [facility];  Service: Open Heart Surgery;  Laterality: N/A;     Current Outpatient Medications  Medication Sig Dispense Refill  . aspirin EC 81 MG tablet Take 81 mg by mouth daily.    [name] ezetimibe (ZETIA) 10 MG tablet TAKE ONE TABLET BY MOUTH DAILY 90 tablet 2  . famotidine (PEPCID) 20 MG tablet Take 20 mg by mouth daily as needed for heartburn or indigestion.    [name] levothyroxine (SYNTHROID, LEVOTHROID) 50 MCG tablet TAKE ONE TABLET BY MOUTH DAILY 90 tablet 1  . losartan (COZAAR) 50 MG tablet Take 1 tablet (50 mg total) by mouth daily. 90 tablet 1  . metoprolol succinate (TOPROL-XL) 25 MG 24 hr tablet TAKE ONE TABLET BY MOUTH TWICE A DAY 180 tablet 2  . nitroGLYCERIN (NITRODUR - DOSED IN MG/24 HR) 0.2 mg/hr patch Place 1/4 to 1/2 of a patch over affected region. Remove and replace once daily.  Slightly alter skin placement daily 30 patch 1  . pravastatin (PRAVACHOL) 10 MG tablet TAKE ONE TABLET BY MOUTH DAILY 90 tablet 0  . sildenafil (VIAGRA) 50 MG tablet Take 1 tablet (50 mg total) by mouth daily as needed. 10 tablet 0  . traZODone (DESYREL) 50 MG tablet TAKE ONE-HALF TABLET BY MOUTH TO  TAKE ONE TABLET BY MOUTH EVERY NIGHT AT BEDTIME AS NEEDED FOR  SLEEP 30 tablet 1   No current facility-administered medications for this visit.     Allergies:   Patient has no known allergies.    Social History:  The patient  reports that he has never smoked. He has never used smokeless tobacco. He reports current alcohol use of about 21.0 - 24.0 standard drinks of alcohol per week. He reports that he does not use drugs.   Family History:  The patient's family history includes Alcohol abuse in his father; CAD in his mother; Cirrhosis in his father; Dementia in his mother; Heart attack in his father; Kidney disease in his mother; Stroke in his maternal grandmother and paternal grandmother; Vascular Disease in his mother.    ROS:  Please see the history of present illness.   Otherwise, review of systems are positive for none.   All other systems are  reviewed and negative.    PHYSICAL EXAM: VS:  BP 136/84   Pulse 68   Ht 5\' 6"  (1.676 m)   Wt 167 lb 12.8 oz (76.1 kg)   BMI 27.08 kg/m  , BMI Body mass index is 27.08 kg/m. GENERAL:  Well appearing HEENT: Pupils equal round and reactive, fundi not visualized, oral mucosa unremarkable NECK:  No jugular venous distention, waveform within normal limits, carotid upstroke brisk and symmetric, no bruits LUNGS:  Clear to auscultation bilaterally HEART:  RRR.  PMI not displaced or sustained,S1 and S2 within normal limits, no S3, no S4, no clicks, no rubs, II/VI systolic murmurs at the LUSB. ABD:  Flat, positive bowel sounds normal in frequency in pitch, no bruits, no rebound, no guarding, no midline pulsatile mass, no hepatomegaly, no splenomegaly EXT:  2 plus pulses throughout, no edema, no cyanosis no clubbing SKIN:  No rashes no nodules NEURO:  Cranial nerves II through XII grossly intact, motor grossly intact throughout PSYCH:  Cognitively intact, oriented to person place and time   EKG:  EKG is ordered today. The ekg ordered 10/25/16  demonstrates sinus rhythm.  Ventricular bigeminy.  10/10/17: Sinus rhythm.  Rate 67 bpm.  Non-specific ST-T changes.  09/18/18: Sinus rhythm.  Rate 68 bpm.  Cannot rule out prior anteroseptal infarct.    Echo 08/30/18: Study Conclusions  - Left ventricle: The cavity size was normal. Wall thickness was   normal. Systolic function was normal. The estimated ejection   fraction was in the range of 55% to 60%. Wall motion was normal;   there were no regional wall motion abnormalities. Doppler   parameters are consistent with abnormal left ventricular   relaxation (grade 1 diastolic dysfunction). - Aortic valve: A bioprosthesis was present. - Ascending aorta: The ascending aorta was mildly dilated. - Mitral valve: There was mild regurgitation. - Pulmonary arteries: PA peak pressure: 32 mm Hg (S).  Impressions:  - Normal LV systolic function; mild diastolic dysfunction; s/p AVR   with normal mean gradient (12 mmHg) and no AI; mildly dilated   ascending aorta (4.3 cm); mild MR.  Echo 01/17/17: Study Conclusions  - Left ventricle: The cavity size was normal. Wall thickness was   normal. Systolic function was moderately to severely reduced. The   estimated ejection fraction was in the range of 30% to 35%.   Moderate diffuse hypokinesis with no identifiable regional   variations. Doppler parameters are consistent with abnormal left   ventricular relaxation (grade 1 diastolic dysfunction). - Ventricular septum: Septal motion showed paradox. These changes   are consistent with a left bundle branch block. - Aortic valve: Possibly bicuspid; severely thickened, severely   calcified leaflets; fusion of the right-noncoronary commissure.   Anterior cusp mobility was severely restricted. Transvalvular   velocity was increased, due to low cardiac output. There was   moderate to severe stenosis. There was mild regurgitation. - Right ventricle: Systolic function was mildly  reduced.  Impressions:  - Suspect low gradient severe aortic stenosis.  Recommendations:  Consider dobutamine echo.  LHC 11/03/16:   Mid RCA to Dist RCA lesion, 10 %stenosed.  RPDA lesion, 10 %stenosed.  Ost Cx to Prox Cx lesion, 40 %stenosed.  Ost Ramus to Ramus lesion, 20 %stenosed.  Mid LAD lesion, 20 %stenosed.  Prox LAD lesion, 30 %stenosed.  The left ventricular ejection fraction is 45-50% by visual estimate.  There is mild left ventricular systolic dysfunction.  LV end diastolic pressure is normal.  There is no mitral  valve regurgitation.   Recent Labs: 05/01/2018: TSH 2.82 05/30/2018: ALT 35; BUN 16; Creatinine, Ser 1.09; Hemoglobin 15.6; Platelets 182.0; Potassium 4.7; Sodium 137    Lipid Panel    Component Value Date/Time   CHOL 157 02/22/2018 0911   TRIG 110 02/22/2018 0911   HDL 60 02/22/2018 0911   CHOLHDL 2.6 02/22/2018 0911   CHOLHDL 3 09/18/2017 0920   VLDL 30.8 09/18/2017 0920   LDLCALC 75 02/22/2018 0911      Wt Readings from Last 3 Encounters:  09/18/18 167 lb 12.8 oz (76.1 kg)  06/06/18 165 lb (74.8 kg)  05/30/18 163 lb (73.9 kg)      ASSESSMENT AND PLAN:  # Chronic systolic and diastolic heart failure:  Resolved.  Mr. Arts' ejection fraction has normalized after aortic valve replacement.  Continue carvedilol and increase losartan to 50mg  for improved BP control.  # Rate related LBBB:  Mr. Timberman developed sinus tachycardia with exercise and a rate related bundle branch block.  No further investigation needed.  He has no significant CAD as noted on left heart catheterization prior to/2018.  # Non-obstructive CAD:  # Hyperlipidemia:  Continue aspirin and  Ezetimibe.  He had elevated LFTs with rosuvastatin. Continue aspirin, ezetimibe and pravastatin.   # Bicuspid aortic valve s/p bioprosthetic AVR: Mr. Birkeland is doing well after aortic valve replacement.  Gradients were mildly elevated on his postoperative echo but the mean  gradient was only 12 mmHg on the most recent echo.   # Mild ascending aorta aneurysm: 4.3 cm on echo 08/2018.  Increased from 3.8 cm previously.  Repeat echo in 6 months.     # Hypertension:  Blood pressure is slightly elevated.  Given his ascending aneurysm we will increase losartan to 50mg  and check BMP in 1 week.  Continue metoprolol.  Goal is <130/80.  # PVCs:  Stable on metoprolol.    Current medicines are reviewed at length with the patient today.  The patient does not have concerns regarding medicines.  The following changes have been made:  Increase losartan.   Labs/ tests ordered today include:   Orders Placed This Encounter  Procedures  . Basic metabolic panel  . EKG 12-Lead  . ECHOCARDIOGRAM COMPLETE    Disposition:   FU with Feliz Lincoln C. Oval Linsey, MD, [facility] in 1 year.    Signed, Sherolyn Trettin C. Oval Linsey, MD, [facility]  09/18/2018 9:40 AM    [place]

## 2018-09-18 NOTE — Patient Instructions (Addendum)
Medication Instructions:  INCREASE YOUR LOSARTAN TO 50 MG DAILY   If you need a refill on your cardiac medications before your next appointment, please call your pharmacy.   Lab work: BMET IN 1 WEEK   If you have labs (blood work) drawn today and your tests are completely normal, you will receive your results only by: [name] MyChart Message (if you have MyChart) OR . A paper copy in the mail If you have any lab test that is abnormal or we need to change your treatment, we will call you to review the results.  Testing/Procedures: Your physician has requested that you have an echocardiogram. Echocardiography is a painless test that uses sound waves to create images of your heart. It provides your doctor with information about the size and shape of your heart and how well your heart's chambers and valves are working. This procedure takes approximately one hour. There are no restrictions for this procedure. [facility] [place] [address] IN 6 MONTHS  Follow-Up: At [facility], you and your health needs are our priority.  As part of our continuing mission to provide you with exceptional heart care, we have created designated Provider Care Teams.  These Care Teams include your primary Cardiologist (physician) and Advanced Practice Providers (APPs -  Physician Assistants and Nurse Practitioners) who all work together to provide you with the care you need, when you need it. You will need a follow up appointment in 12 months.  Please call our office 2 months in advance to schedule this appointment.  You may see DR [facility]  or one of the following Advanced Practice Providers on your designated Care Team:   Kerin Ransom, PA-C Roby Lofts, [place] . Sande Rives, PA-C  Your physician recommends that you schedule a follow-up appointment in: 1 MONTH WITH PHARM D FOR BLOOD PRESSURE   Any Other Special Instructions Will Be Listed Below (If Applicable). TRACK YOUR BLOOD PRESSURE AND BRING TO  YOUR FOLLOW UP VISIT

## 2018-09-19 ENCOUNTER — Other Ambulatory Visit: Payer: Self-pay | Admitting: Cardiovascular Disease

## 2018-09-20 NOTE — Telephone Encounter (Signed)
Rx has been sent to the pharmacy electronically. ° °

## 2018-09-21 DIAGNOSIS — Z5181 Encounter for therapeutic drug level monitoring: Secondary | ICD-10-CM | POA: Diagnosis not present

## 2018-09-21 LAB — BASIC METABOLIC PANEL
BUN/Creatinine Ratio: 16 (ref 10–24)
BUN: 16 mg/dL (ref 8–27)
CALCIUM: 9.8 mg/dL (ref 8.6–10.2)
CHLORIDE: 99 mmol/L (ref 96–106)
CO2: 25 mmol/L (ref 20–29)
Creatinine, Ser: 0.98 mg/dL (ref 0.76–1.27)
GFR calc Af Amer: 91 mL/min/{1.73_m2} (ref >59)
GFR calc non Af Amer: 79 mL/min/{1.73_m2} (ref >59)
Glucose: 106 mg/dL — ABNORMAL HIGH (ref 65–99)
POTASSIUM: 5.1 mmol/L (ref 3.5–5.2)
Sodium: 137 mmol/L (ref 134–144)

## 2018-09-24 ENCOUNTER — Ambulatory Visit (INDEPENDENT_AMBULATORY_CARE_PROVIDER_SITE_OTHER): Payer: Medicare Other | Admitting: Family Medicine

## 2018-09-24 ENCOUNTER — Other Ambulatory Visit: Payer: Self-pay

## 2018-09-24 ENCOUNTER — Encounter: Payer: Self-pay | Admitting: Family Medicine

## 2018-09-24 ENCOUNTER — Other Ambulatory Visit: Payer: Self-pay | Admitting: Cardiovascular Disease

## 2018-09-24 VITALS — BP 132/78 | HR 68 | Temp 98.3°F | Resp 16 | Ht 66.0 in | Wt 169.0 lb

## 2018-09-24 DIAGNOSIS — E7849 Other hyperlipidemia: Secondary | ICD-10-CM | POA: Diagnosis not present

## 2018-09-24 DIAGNOSIS — I10 Essential (primary) hypertension: Secondary | ICD-10-CM

## 2018-09-24 DIAGNOSIS — E039 Hypothyroidism, unspecified: Secondary | ICD-10-CM

## 2018-09-24 LAB — CBC WITH DIFFERENTIAL/PLATELET
Basophils Absolute: 0 10*3/uL (ref 0.0–0.1)
Basophils Relative: 0.7 % (ref 0.0–3.0)
Eosinophils Absolute: 0.1 10*3/uL (ref 0.0–0.7)
Eosinophils Relative: 1.8 % (ref 0.0–5.0)
HCT: 45.4 % (ref 39.0–52.0)
HEMOGLOBIN: 15.7 g/dL (ref 13.0–17.0)
Lymphocytes Relative: 21.3 % (ref 12.0–46.0)
Lymphs Abs: 0.9 10*3/uL (ref 0.7–4.0)
MCHC: 34.6 g/dL (ref 30.0–36.0)
MCV: 97.3 fl (ref 78.0–100.0)
Monocytes Absolute: 0.5 10*3/uL (ref 0.1–1.0)
Monocytes Relative: 12.5 % — ABNORMAL HIGH (ref 3.0–12.0)
Neutro Abs: 2.5 10*3/uL (ref 1.4–7.7)
Neutrophils Relative %: 63.7 % (ref 43.0–77.0)
Platelets: 172 10*3/uL (ref 150.0–400.0)
RBC: 4.67 Mil/uL (ref 4.22–5.81)
RDW: 13.4 % (ref 11.5–15.5)
WBC: 4 10*3/uL (ref 4.0–10.5)

## 2018-09-24 LAB — LIPID PANEL
Cholesterol: 151 mg/dL (ref 0–200)
HDL: 58.9 mg/dL (ref >39.00)
LDL Cholesterol: 71 mg/dL (ref 0–99)
NONHDL: 92.27
Total CHOL/HDL Ratio: 3
Triglycerides: 107 mg/dL (ref 0.0–149.0)
VLDL: 21.4 mg/dL (ref 0.0–40.0)

## 2018-09-24 LAB — HEPATIC FUNCTION PANEL
ALT: 36 U/L (ref 0–53)
AST: 29 U/L (ref 0–37)
Albumin: 4.4 g/dL (ref 3.5–5.2)
Alkaline Phosphatase: 65 U/L (ref 39–117)
BILIRUBIN DIRECT: 0.1 mg/dL (ref 0.0–0.3)
Total Bilirubin: 0.7 mg/dL (ref 0.2–1.2)
Total Protein: 6.5 g/dL (ref 6.0–8.3)

## 2018-09-24 LAB — TSH: TSH: 2.19 u[IU]/mL (ref 0.35–4.50)

## 2018-09-24 NOTE — Assessment & Plan Note (Signed)
Chronic problem.  Tolerating statin w/o difficulty.  Check labs.  Adjust meds prn  

## 2018-09-24 NOTE — Telephone Encounter (Signed)
losartan (COZAAR) 50 MG tablet  Medication  Date: 09/18/2018 Department: [facility] Ordering/Authorizing: Skeet Latch, MD  Order Providers   Prescribing Provider Encounter Provider  Skeet Latch, MD Skeet Latch, MD  Outpatient Medication Detail    Disp Refills Start End   losartan (COZAAR) 50 MG tablet 90 tablet 1 09/18/2018    Sig - Route: Take 1 tablet (50 mg total) by mouth daily. - Oral   Sent to pharmacy as: losartan (COZAAR) 50 MG tablet   Notes to Pharmacy: New dose, d/c 25 mg tabs   E-Prescribing Status: Receipt confirmed by pharmacy (09/18/2018 9:36 AM EST)   Pharmacy   [facility] [id number] - [place], [place]

## 2018-09-24 NOTE — Patient Instructions (Signed)
Follow up in 6 months to recheck BP and cholesterol We'll notify you of your lab results and make any changes if needed Keep up the good work on healthy diet and exercise as you are able Call with any questions or concerns Happy New Year!!!

## 2018-09-24 NOTE — Assessment & Plan Note (Signed)
Pt is currently asymptomatic.  Check labs.  Adjust meds prn

## 2018-09-24 NOTE — Progress Notes (Signed)
   Subjective:    Patient ID: [name] Brown, male    DOB: 25-Aug-1951, 68 y.o.   MRN: [id number]  HPI Hyperlipidemia- chronic problem, on Pravastatin 10mg  and Zetia 10mg .  Denies abd pain, N/V.  HTN- chronic problem, on Losartan 50mg  and Metoprolol 25mg  BID w/ adequate control.  No CP, SOB, HAs, visual changes, edema.  Hypothyroid- chronic problem, on Levothyroxine 73mcg daily. Denies excessive fatigue, changes to skin/hair/nails.   Review of Systems For ROS see HPI     Objective:   Physical Exam Vitals signs reviewed.  Constitutional:      General: He is not in acute distress.    Appearance: He is well-developed.  HENT:     Head: Normocephalic and atraumatic.  Eyes:     Conjunctiva/sclera: Conjunctivae normal.     Pupils: Pupils are equal, round, and reactive to light.  Neck:     Musculoskeletal: Normal range of motion and neck supple.     Thyroid: No thyromegaly.  Cardiovascular:     Rate and Rhythm: Normal rate and regular rhythm.     Heart sounds: Murmur (aortic valve click) present.  Pulmonary:     Effort: Pulmonary effort is normal. No respiratory distress.     Breath sounds: Normal breath sounds.  Abdominal:     General: Bowel sounds are normal. There is no distension.     Palpations: Abdomen is soft.  Lymphadenopathy:     Cervical: No cervical adenopathy.  Skin:    General: Skin is warm and dry.  Neurological:     Mental Status: He is alert and oriented to person, place, and time.     Cranial Nerves: No cranial nerve deficit.  Psychiatric:        Behavior: Behavior normal.           Assessment & Plan:

## 2018-09-24 NOTE — Assessment & Plan Note (Signed)
Chronic problem.  Adequate control.  Asymptomatic at this time.  Will follow.

## 2018-09-25 ENCOUNTER — Encounter: Payer: Self-pay | Admitting: General Practice

## 2018-10-23 ENCOUNTER — Other Ambulatory Visit: Payer: Self-pay | Admitting: Family Medicine

## 2018-10-23 ENCOUNTER — Other Ambulatory Visit: Payer: Self-pay | Admitting: Cardiovascular Disease

## 2018-10-25 ENCOUNTER — Ambulatory Visit (INDEPENDENT_AMBULATORY_CARE_PROVIDER_SITE_OTHER): Payer: Medicare Other | Admitting: Pharmacist Clinician (PhC)/ Clinical Pharmacy Specialist

## 2018-10-25 DIAGNOSIS — I10 Essential (primary) hypertension: Secondary | ICD-10-CM | POA: Diagnosis not present

## 2018-10-25 NOTE — Assessment & Plan Note (Signed)
Patient with essential hypertension, well controlled on his current regimen. He did have an increase in the size of his aortic aneurysm on the last scan, so will aim to get his BP goal closer to 734 systolic.  Patient current home readings average 287 systolic, so will not make any changes at this time.  He was given our number and encouraged to call should he notice an increase in systolic to >681.

## 2018-10-25 NOTE — Patient Instructions (Signed)
  Your blood pressure today is 134/76   Check your blood pressure at home daily and keep record of the readings. If you notice that readings are averaging greater than 135-140 please give [place] a call.  Manisha Cancel/Raquel at [phone]  Take your BP meds as follows:  Continue with all your current medications    Bring all of your meds, your BP cuff and your record of home blood pressures to your next appointment.  Exercise as you're able, try to walk approximately 30 minutes per day.  Keep salt intake to a minimum, especially watch canned and prepared boxed foods.  Eat more fresh fruits and vegetables and fewer canned items.  Avoid eating in fast food restaurants.    HOW TO TAKE YOUR BLOOD PRESSURE: . Rest 5 minutes before taking your blood pressure. .  Don't smoke or drink caffeinated beverages for at least 30 minutes before. . Take your blood pressure before (not after) you eat. . Sit comfortably with your back supported and both feet on the floor (don't cross your legs). . Elevate your arm to heart level on a table or a desk. . Use the proper sized cuff. It should fit smoothly and snugly around your bare upper arm. There should be enough room to slip a fingertip under the cuff. The bottom edge of the cuff should be 1 inch above the crease of the elbow. . Ideally, take 3 measurements at one sitting and record the average.

## 2018-10-25 NOTE — Progress Notes (Signed)
10/25/2018 [name] Brown 1951-05-11 [id number]   HPI:  [name] Brown is a 68 y.o. male patient of Dr Oval Linsey, with a [place] below who presents today for hypertension clinic evaluation.  In addition to hypertension, his medical history is significant for non-obstructivd CAD, chronic systolic/diastolic heart failure, aortic stenosis s/p bioprosthetic aortic valve (2018), mild ascending aortic aneurysm, OSA (on CPAP), hypothyroidism and prior colon cancer s/p colectomy.    An echo in December 2019 showed an increase in his ascending aortic aneurysm from 3.8 to 4.3 cm over one year.  Dr. Oval Linsey would like to keep his BP at < 130/80 and at his last visit she increased his losartan to 50 mg daily.   He saw his PCP about a week later and was noted to have a BP of 132/78.  He returns today for follow up.  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  He has been checking home BP readings regularly and has no concerns about his current medications.  In reviewing the current hypertension guidelines, beta blockers are the preferred medication to treat hypertension in aortic aneurysms.  There is no specific outpatient BP goal for these patients, but in emergent situations they should be treated to < 299 systolic.  We will see if we can get him to this goal without causing any hypotensive response.  Blood Pressure Goal:  120/80  Current Medications:  Losartan 50 mg qd  Metoprolol succ 25 mg bid  Family Hx:  Mother vascular dementia, CAD, renal hypertension, died at 34  MGM died of stroke  Father died form MI at 47, alcoholic liver disease  PGM had to retire early because of severe hypertension (in the 81's)  Siblings without issues   Social Hx:  2-3 glasses of wine per night; no tobacco, 2 mugs of coffee per day  Diet:  Almost exclusively home cooked; no red meat or fowl, mostly seafood and vegetables, some salt added  Exercise:  Gym 5 days per week since cardiac rehab, about 1 hour of cardio  then weight training.   Home BP readings:   Omron cuff about 69 year old, checks most days, up to three times daily.    Averages: AM (14) 123/71, noon(17) 122/72, PM (25) 122/72  Intolerances:   nkda  Labs:  09/21/2018:  Na 137, K 5.1, Glu 106, BUN 16, SCr 0.98  Wt Readings from Last 3 Encounters:  09/24/18 169 lb (76.7 kg)  09/18/18 167 lb 12.8 oz (76.1 kg)  06/06/18 165 lb (74.8 kg)   BP Readings from Last 3 Encounters:  10/25/18 134/76  09/24/18 132/78  09/18/18 136/84   Pulse Readings from Last 3 Encounters:  10/25/18 64  09/24/18 68  09/18/18 68    Current Outpatient Medications  Medication Sig Dispense Refill  . aspirin EC 81 MG tablet Take 81 mg by mouth daily.    [name] ezetimibe (ZETIA) 10 MG tablet TAKE ONE TABLET BY MOUTH DAILY 90 tablet 3  . famotidine (PEPCID) 20 MG tablet Take 20 mg by mouth daily as needed for heartburn or indigestion.    [name] levothyroxine (SYNTHROID, LEVOTHROID) 50 MCG tablet TAKE 1 TABLET BY MOUTH DAILY 90 tablet 0  . losartan (COZAAR) 50 MG tablet Take 1 tablet (50 mg total) by mouth daily. 90 tablet 1  . metoprolol succinate (TOPROL-XL) 25 MG 24 hr tablet TAKE ONE TABLET BY MOUTH TWICE A DAY 180 tablet 2  . nitroGLYCERIN (NITRODUR - DOSED IN MG/24 HR) 0.2 mg/hr patch Place  1/4 to 1/2 of a patch over affected region. Remove and replace once daily.  Slightly alter skin placement daily 30 patch 1  . pravastatin (PRAVACHOL) 10 MG tablet TAKE ONE TABLET BY MOUTH DAILY 90 tablet 3  . sildenafil (VIAGRA) 50 MG tablet Take 1 tablet (50 mg total) by mouth daily as needed. 10 tablet 0  . traZODone (DESYREL) 50 MG tablet TAKE ONE-HALF TABLET BY MOUTH TO TAKE ONE TABLET BY MOUTH EVERY NIGHT AT BEDTIME AS NEEDED FOR  SLEEP 30 tablet 1   No current facility-administered medications for this visit.     No Known Allergies  Past Medical History:  Diagnosis Date  . Aortic stenosis   . Ascending aortic aneurysm ([place]) 09/18/2018  . Colon cancer ([place])   . Family  history of adverse reaction to anesthesia    pt states his mother had 2 episodes of hyponatremia following anesthesia   . GERD (gastroesophageal reflux disease)   . Headache   . Heart murmur   . Hyperlipidemia 10/25/2016  . Hypertension    pt is currently not taking any medications; pt states is borderline  . Melanoma ([place])   . OSA on CPAP 09/18/2018  . PAF (paroxysmal atrial fibrillation) ([place]) 06/13/2017  . Perforated sigmoid colon ([place])   . PVC's (premature ventricular contractions) 10/25/2016  . Tinnitus   . Wears glasses     Blood pressure 134/76, pulse 64.  Essential hypertension Patient with essential hypertension, well controlled on his current regimen. He did have an increase in the size of his aortic aneurysm on the last scan, so will aim to get his BP goal closer to 887 systolic.  Patient current home readings average 195 systolic, so will not make any changes at this time.  He was given our number and encouraged to call should he notice an increase in systolic to >974.      Tommy Medal PharmD CPP [facility] [address] [place], [place] [address] [phone]

## 2018-10-28 ENCOUNTER — Encounter: Payer: Self-pay | Admitting: Neurology

## 2018-10-29 NOTE — Progress Notes (Addendum)
[facility]  PATIENT: [name] Brown DOB: [birth year]   REASON FOR VISIT: Follow-up for obstructive sleep apnea with initial CPAP HISTORY FROM: Patient    HISTORY OF PRESENT ILLNESS:UPDATE 2/18/2020CM [name], 68 year old male returns for follow-up with newly diagnosed obstructive sleep apnea here for initial CPAP compliance.  He has been doing well and less fatigue.  Compliance data dated 09/29/2018-10/28/2018 shows compliance greater than 4 hours at 100%.  Average usage 7 hours 55 minutes.  Set pressure 9 cm leak 95th percentile 35.6.  AHI 1.2.  He returns for reevaluation MRI of the brain performed 06/18/2018 with and without contrast was normal. 9/25/19SA15 year old right-handed gentleman with an underlying medical history of aortic stenosis with heart murmur, hypertension, hyperlipidemia, history of melanoma, history of paroxysmal A. fib, reflux disease, history of perforated sigmoid colon, history of PVCs, tinnitus, and mildly overweight state, who reports a several month history of a constant sense of dizziness, no actual vertigo reported, no recent falls thankfully. He has had multiple surgeries including melanoma excision, partial colectomy, colostomy closure, TEE wo cardioversion, and aortic valve replacement. He is a retired Software engineer, nonsmoker but drinks alcohol on a regular basis, about 21 to 24 drinks per week on average. He drinks caffeine in the form of coffee, 2 cups per day on average. He is working on reducing his alcohol intake. He wants to lose some weight and is striving for a BMI of 25 unless. Of note, he does not always sleep well, has sleep disruption, snores, wakes up to go to the bathroom about 2 or 3 times on an average night. He denies morning headaches. His dizziness started a few months ago and was gradual. A few years ago he had very rare symptoms and now he seems to have more constant symptoms. He denies any spinning sensation. He has a  long-standing history of tinnitus and mild hearing loss. He has not seen ENT recently. Bedtime is between 11 and 11:30 PM, rise time is generally between 7 and 8. His Epworth sleepiness score is 13 out of 24, fatigue score is 26 out of 63.   REVIEW OF SYSTEMS: Full 14 system review of systems performed and notable only for those listed, all others are neg:  Constitutional: neg  Cardiovascular: neg Ear/Nose/Throat: neg  Skin: neg Eyes: Hearing loss Respiratory: neg Gastroitestinal: neg  Hematology/Lymphatic: neg  Endocrine: neg Musculoskeletal:neg Allergy/Immunology: neg Neurological: neg Psychiatric: neg Sleep : Obstructive sleep apnea with CPAP   ALLERGIES: No Known Allergies  HOME MEDICATIONS: Outpatient Medications Prior to Visit  Medication Sig Dispense Refill  . aspirin EC 81 MG tablet Take 81 mg by mouth daily.    [name] ezetimibe (ZETIA) 10 MG tablet TAKE ONE TABLET BY MOUTH DAILY 90 tablet 3  . famotidine (PEPCID) 20 MG tablet Take 20 mg by mouth daily as needed for heartburn or indigestion.    [name] levothyroxine (SYNTHROID, LEVOTHROID) 50 MCG tablet TAKE 1 TABLET BY MOUTH DAILY 90 tablet 0  . losartan (COZAAR) 50 MG tablet Take 1 tablet (50 mg total) by mouth daily. 90 tablet 1  . metoprolol succinate (TOPROL-XL) 25 MG 24 hr tablet TAKE ONE TABLET BY MOUTH TWICE A DAY 180 tablet 2  . pravastatin (PRAVACHOL) 10 MG tablet TAKE ONE TABLET BY MOUTH DAILY 90 tablet 3  . sildenafil (VIAGRA) 50 MG tablet Take 1 tablet (50 mg total) by mouth daily as needed. 10 tablet 0  . traZODone (DESYREL) 50 MG tablet TAKE ONE-HALF TABLET BY MOUTH TO TAKE  ONE TABLET BY MOUTH EVERY NIGHT AT BEDTIME AS NEEDED FOR  SLEEP 30 tablet 1  . nitroGLYCERIN (NITRODUR - DOSED IN MG/24 HR) 0.2 mg/hr patch Place 1/4 to 1/2 of a patch over affected region. Remove and replace once daily.  Slightly alter skin placement daily (Patient not taking: Reported on 10/30/2018) 30 patch 1   No facility-administered  medications prior to visit.     PAST MEDICAL HISTORY: Past Medical History:  Diagnosis Date  . Aortic stenosis   . Ascending aortic aneurysm ([place]) 09/18/2018  . Colon cancer ([place])   . Family history of adverse reaction to anesthesia    pt states his mother had 2 episodes of hyponatremia following anesthesia   . GERD (gastroesophageal reflux disease)   . Headache   . Heart murmur   . Hyperlipidemia 10/25/2016  . Hypertension    pt is currently not taking any medications; pt states is borderline  . Melanoma ([place])   . OSA on CPAP 09/18/2018  . PAF (paroxysmal atrial fibrillation) ([place]) 06/13/2017  . Perforated sigmoid colon ([place])   . PVC's (premature ventricular contractions) 10/25/2016  . Tinnitus   . Wears glasses     PAST SURGICAL HISTORY: Past Surgical History:  Procedure Laterality Date  . AORTIC VALVE REPLACEMENT N/A 06/08/2017   Procedure: AORTIC VALVE REPLACEMENT (AVR);  Surgeon: [name], MD;  Location: [facility];  Service: Open Heart Surgery;  Laterality: N/A;  Using 74mm Perimount Magna Ease Pericardial Bioprosthesis Aortic Valve  . COLOSTOMY CLOSURE N/A 08/18/2016   Procedure: COLOSTOMY CLOSURE OPEN PROCEDURE;  Surgeon: Armandina Gemma, MD;  Location: [facility];  Service: General;  Laterality: N/A;  . LAPAROTOMY N/A 04/25/2016   Procedure: EXPLORATORY LAPAROTOMY WITH SIGMOID COLECTOMY, COLOSTOMY;  Surgeon: Armandina Gemma, MD;  Location: [facility];  Service: General;  Laterality: N/A;  . LEFT HEART CATH AND CORONARY ANGIOGRAPHY N/A 11/03/2016   Procedure: Left Heart Cath and Coronary Angiography;  Surgeon: Burnell Blanks, MD;  Location: [facility];  Service: Cardiovascular;  Laterality: N/A;  . MELANOMA EXCISION    . PARTIAL COLECTOMY Right 08/18/2016   Procedure: RIGHT COLECTOMY;  Surgeon: Armandina Gemma, MD;  Location: [facility];  Service: General;  Laterality: Right;  . TEE WITHOUT CARDIOVERSION N/A 06/08/2017   Procedure: TRANSESOPHAGEAL ECHOCARDIOGRAM (TEE);  Surgeon:  [name], MD;  Location: [facility];  Service: Open Heart Surgery;  Laterality: N/A;    FAMILY HISTORY: Family History  Problem Relation Age of Onset  . Dementia Mother   . Kidney disease Mother   . Vascular Disease Mother   . CAD Mother   . Heart attack Father   . Alcohol abuse Father   . Cirrhosis Father   . Stroke Maternal Grandmother   . Stroke Paternal Grandmother     SOCIAL HISTORY: Social History   Socioeconomic History  . Marital status: Married    Spouse name: Not on file  . Number of children: Not on file  . Years of education: Not on file  . Highest education level: Not on file  Occupational History  . Occupation: Retired Lawyer  . Financial resource strain: Not on file  . Food insecurity:    Worry: Never true    Inability: Never true  . Transportation needs:    Medical: No    Non-medical: No  Tobacco Use  . Smoking status: Never Smoker  . Smokeless tobacco: Never Used  Substance and Sexual Activity  . Alcohol use: Yes  Alcohol/week: 21.0 - 24.0 standard drinks    Types: 21 - 24 Standard drinks or equivalent per week    Comment: 2-3 glasses of wine daily   . Drug use: No  . Sexual activity: Not on file  Lifestyle  . Physical activity:    Days per week: 4 days    Minutes per session: 30 min  . Stress: Not at all  Relationships  . Social connections:    Talks on phone: Not on file    Gets together: Not on file    Attends religious service: Not on file    Active member of club or organization: Not on file    Attends meetings of clubs or organizations: Not on file    Relationship status: Married  . Intimate partner violence:    Fear of current or ex partner: Not on file    Emotionally abused: Not on file    Physically abused: Not on file    Forced sexual activity: Not on file  Other Topics Concern  . Not on file  Social History Narrative   Pt lives in [place] with his wife, they are active.      Pt lives at home  with his wife.     PHYSICAL EXAM  Vitals:   10/30/18 1315  BP: 123/72  Pulse: (!) 54  Weight: 171 lb 6.4 oz (77.7 kg)  Height: 5\' 6"  (1.676 m)   Body mass index is 27.66 kg/m.  Generalized: Well developed, in no acute distress  Head: normocephalic and atraumatic,. Oropharynx benign  Neck: Supple,  Musculoskeletal: No deformity   Neurological examination   Mentation: Alert oriented to time, place, history taking. Attention span and concentration appropriate. Recent and remote memory intact.  Follows all commands speech and language fluent.   Cranial nerve II-XII: Pupils were equal round reactive to light extraocular movements were full, visual field were full on confrontational test. Facial sensation and strength were normal. hearing was intact to finger rubbing bilaterally. Uvula tongue midline. head turning and shoulder shrug were normal and symmetric.Tongue protrusion into cheek strength was normal. Motor: normal bulk and tone, full strength in the BUE, BLE,  Sensory: normal and symmetric to light touch,  Coordination: finger-nose-finger,  no dysmetria Gait and Station: Rising up from seated position without assistance, normal stance,  moderate stride, good arm swing, smooth turning, able to perform tiptoe, and heel walking without difficulty. Tandem gait is steady  DIAGNOSTIC DATA (LABS, IMAGING, TESTING) - I reviewed patient records, labs, notes, testing and imaging myself where available.  Lab Results  Component Value Date   WBC 4.0 09/24/2018   HGB 15.7 09/24/2018   HCT 45.4 09/24/2018   MCV 97.3 09/24/2018   PLT 172.0 09/24/2018      Component Value Date/Time   NA 137 09/21/2018 1136   K 5.1 09/21/2018 1136   CL 99 09/21/2018 1136   CO2 25 09/21/2018 1136   GLUCOSE 106 (H) 09/21/2018 1136   GLUCOSE 92 05/30/2018 1421   BUN 16 09/21/2018 1136   CREATININE 0.98 09/21/2018 1136   CREATININE 1.04 10/25/2016 1547   CALCIUM 9.8 09/21/2018 1136   PROT 6.5  09/24/2018 1052   PROT 6.5 02/22/2018 0911   ALBUMIN 4.4 09/24/2018 1052   ALBUMIN 4.5 02/22/2018 0911   AST 29 09/24/2018 1052   ALT 36 09/24/2018 1052   ALKPHOS 65 09/24/2018 1052   BILITOT 0.7 09/24/2018 1052   BILITOT 0.5 02/22/2018 0911   GFRNONAA 79 09/21/2018 1136  GFRAA 91 09/21/2018 1136   Lab Results  Component Value Date   CHOL 151 09/24/2018   HDL 58.90 09/24/2018   LDLCALC 71 09/24/2018   TRIG 107.0 09/24/2018   CHOLHDL 3 09/24/2018   Lab Results  Component Value Date   HGBA1C 5.1 06/06/2017   Lab Results  Component Value Date   SVXBLTJQ30 092 05/30/2018   Lab Results  Component Value Date   TSH 2.19 09/24/2018      ASSESSMENT AND PLAN Atlas Crossland is a very pleasant 68 y.o.-year old male with an underlying medical history of aortic stenosis with heart murmur, hypertension, hyperlipidemia, history of melanoma, history of paroxysmal A. fib,  tinnitus, and mildly overweight state, who presents for evaluation of his dizziness.  He is here to follow-up for newly diagnosed obstructive sleep apnea. Compliance data dated 09/29/2018-10/28/2018 shows compliance greater than 4 hours at 100%.  Average usage 7 hours 55 minutes.  Set pressure 9 cm leak 95th percentile 35.6.  AHI 1.2.    CPAP compliance 100% reviewed data with patient Needs mask refit for significant leak order sent Continue same settings Follow-up in 6 months Dennie Bible, [facility], [facility], [facility] [address] [place], [place] [address] [phone]  I reviewed the above note and documentation by the Nurse Practitioner and agree with the history, physical exam, assessment and plan as outlined above. I was immediately available for face-to-face consultation. Star Age, MD, PhD [facility])

## 2018-10-30 ENCOUNTER — Ambulatory Visit (INDEPENDENT_AMBULATORY_CARE_PROVIDER_SITE_OTHER): Payer: Medicare Other | Admitting: Nurse Practitioner

## 2018-10-30 ENCOUNTER — Encounter: Payer: Self-pay | Admitting: Nurse Practitioner

## 2018-10-30 VITALS — BP 123/72 | HR 54 | Ht 66.0 in | Wt 171.4 lb

## 2018-10-30 DIAGNOSIS — Z9989 Dependence on other enabling machines and devices: Secondary | ICD-10-CM | POA: Diagnosis not present

## 2018-10-30 DIAGNOSIS — G4733 Obstructive sleep apnea (adult) (pediatric): Secondary | ICD-10-CM | POA: Diagnosis not present

## 2018-10-30 NOTE — Progress Notes (Signed)
[name] Brown with [facility] received cpap orders and will process.

## 2018-10-30 NOTE — Patient Instructions (Signed)
CPAP compliance 100% Needs mask refit for significant leak Continue same settings Follow-up in 6 months

## 2018-11-01 DIAGNOSIS — H2513 Age-related nuclear cataract, bilateral: Secondary | ICD-10-CM | POA: Diagnosis not present

## 2018-11-01 DIAGNOSIS — H5213 Myopia, bilateral: Secondary | ICD-10-CM | POA: Diagnosis not present

## 2018-11-10 ENCOUNTER — Other Ambulatory Visit: Payer: Self-pay | Admitting: Family Medicine

## 2018-12-23 ENCOUNTER — Other Ambulatory Visit: Payer: Self-pay

## 2018-12-23 ENCOUNTER — Emergency Department (HOSPITAL_COMMUNITY)
Admission: EM | Admit: 2018-12-23 | Discharge: 2018-12-23 | Disposition: A | Discharge status: Home / Self Care | Payer: Medicare Other | Attending: Emergency Medicine | Admitting: Emergency Medicine

## 2018-12-23 ENCOUNTER — Encounter (HOSPITAL_COMMUNITY): Payer: Self-pay

## 2018-12-23 ENCOUNTER — Emergency Department (HOSPITAL_COMMUNITY): Payer: Medicare Other

## 2018-12-23 DIAGNOSIS — I2510 Atherosclerotic heart disease of native coronary artery without angina pectoris: Secondary | ICD-10-CM | POA: Insufficient documentation

## 2018-12-23 DIAGNOSIS — N201 Calculus of ureter: Secondary | ICD-10-CM | POA: Insufficient documentation

## 2018-12-23 DIAGNOSIS — Z79899 Other long term (current) drug therapy: Secondary | ICD-10-CM | POA: Diagnosis not present

## 2018-12-23 DIAGNOSIS — Z85038 Personal history of other malignant neoplasm of large intestine: Secondary | ICD-10-CM | POA: Insufficient documentation

## 2018-12-23 DIAGNOSIS — Z952 Presence of prosthetic heart valve: Secondary | ICD-10-CM | POA: Diagnosis not present

## 2018-12-23 DIAGNOSIS — I10 Essential (primary) hypertension: Secondary | ICD-10-CM | POA: Diagnosis not present

## 2018-12-23 DIAGNOSIS — Z7982 Long term (current) use of aspirin: Secondary | ICD-10-CM | POA: Diagnosis not present

## 2018-12-23 DIAGNOSIS — R109 Unspecified abdominal pain: Secondary | ICD-10-CM | POA: Diagnosis not present

## 2018-12-23 DIAGNOSIS — R1030 Lower abdominal pain, unspecified: Secondary | ICD-10-CM | POA: Diagnosis present

## 2018-12-23 LAB — COMPREHENSIVE METABOLIC PANEL WITH GFR
ALT: 52 U/L — ABNORMAL HIGH (ref 0–44)
AST: 35 U/L (ref 15–41)
Albumin: 4.5 g/dL (ref 3.5–5.0)
Alkaline Phosphatase: 73 U/L (ref 38–126)
Anion gap: 7 (ref 5–15)
BUN: 14 mg/dL (ref 8–23)
CO2: 27 mmol/L (ref 22–32)
Calcium: 9.2 mg/dL (ref 8.9–10.3)
Chloride: 102 mmol/L (ref 98–111)
Creatinine, Ser: 1.08 mg/dL (ref 0.61–1.24)
GFR calc Af Amer: >60 mL/min
GFR calc non Af Amer: >60 mL/min
Glucose, Bld: 149 mg/dL — ABNORMAL HIGH (ref 70–99)
Potassium: 4.3 mmol/L (ref 3.5–5.1)
Sodium: 136 mmol/L (ref 135–145)
Total Bilirubin: 0.6 mg/dL (ref 0.3–1.2)
Total Protein: 7 g/dL (ref 6.5–8.1)

## 2018-12-23 LAB — URINALYSIS, ROUTINE W REFLEX MICROSCOPIC
Bilirubin Urine: NEGATIVE
Glucose, UA: NEGATIVE mg/dL
Ketones, ur: NEGATIVE mg/dL
Leukocytes,Ua: NEGATIVE
Nitrite: NEGATIVE
Protein, ur: NEGATIVE mg/dL
Specific Gravity, Urine: 1.024 (ref 1.005–1.030)
pH: 6 (ref 5.0–8.0)

## 2018-12-23 LAB — CBC
HCT: 45.8 % (ref 39.0–52.0)
Hemoglobin: 15.9 g/dL (ref 13.0–17.0)
MCH: 33.5 pg (ref 26.0–34.0)
MCHC: 34.7 g/dL (ref 30.0–36.0)
MCV: 96.4 fL (ref 80.0–100.0)
Platelets: 180 K/uL (ref 150–400)
RBC: 4.75 MIL/uL (ref 4.22–5.81)
RDW: 12.5 % (ref 11.5–15.5)
WBC: 4.9 K/uL (ref 4.0–10.5)
nRBC: 0 % (ref 0.0–0.2)

## 2018-12-23 LAB — LIPASE, BLOOD: Lipase: 24 U/L (ref 11–51)

## 2018-12-23 MED ORDER — IBUPROFEN 600 MG PO TABS: 600.0000 mg | ORAL_TABLET | Freq: Three times a day (TID) | ORAL | 0 refills | Status: DC | PRN

## 2018-12-23 MED ORDER — OXYCODONE-ACETAMINOPHEN 5-325 MG PO TABS: 1.0000 | ORAL_TABLET | ORAL | 0 refills | Status: DC | PRN

## 2018-12-23 MED ORDER — TAMSULOSIN HCL 0.4 MG PO CAPS: 0.4000 mg | ORAL_CAPSULE | Freq: Every day | ORAL | 0 refills | Status: DC

## 2018-12-23 MED ORDER — ONDANSETRON 8 MG PO TBDP: 8.0000 mg | ORAL_TABLET | Freq: Three times a day (TID) | ORAL | 0 refills | Status: DC | PRN

## 2018-12-23 MED ORDER — KETOROLAC TROMETHAMINE 15 MG/ML IJ SOLN
15.0000 mg | Freq: Once | INTRAMUSCULAR | Status: AC
  Administered 2018-12-23: 21:00:00 15 mg via INTRAVENOUS
  Filled 2018-12-23: qty 1

## 2018-12-23 NOTE — ED Provider Notes (Signed)
[facility] DEPT Provider Note   CSN: [id number] Arrival date & time: 12/23/18  2008    History   Chief Complaint Chief Complaint  Patient presents with  . Abdominal Pain  . Testicle Pain    HPI [name] Brown is a 68 y.o. male.     HPI Patient is a 68 year old male presents the emergency department severe left lower abdominal pain with radiation into his left testicle.  No history of hernia.  No history of kidney stones.  His pain is moderate in severity at this time.  Denies nausea vomiting.  No diarrhea.  No fevers or chills.  No chest pain or shortness of breath.  He does have a history of aortic stenosis and asending aortic aneurysm.  No back or flank pain at this time.  Never had pain like this.  He reports urinary urgency without dysuria   Past Medical History:  Diagnosis Date  . Aortic stenosis   . Ascending aortic aneurysm ([place]) 09/18/2018  . Colon cancer ([place])   . Family history of adverse reaction to anesthesia    pt states his mother had 2 episodes of hyponatremia following anesthesia   . GERD (gastroesophageal reflux disease)   . Headache   . Heart murmur   . Hyperlipidemia 10/25/2016  . Hypertension    pt is currently not taking any medications; pt states is borderline  . Melanoma ([place])   . OSA on CPAP 09/18/2018  . PAF (paroxysmal atrial fibrillation) ([place]) 06/13/2017  . Perforated sigmoid colon ([place])   . PVC's (premature ventricular contractions) 10/25/2016  . Tinnitus   . Wears glasses     Patient Active Problem List   Diagnosis Date Noted  . Hypothyroid 09/24/2018  . OSA on CPAP 09/18/2018  . Ascending aortic aneurysm ([place]) 09/18/2018  . Rotator cuff syndrome of left shoulder 04/03/2018  . PAF (paroxysmal atrial fibrillation) ([place]) 06/13/2017  . S/P AVR (aortic valve replacement) 06/08/2017  . Non-ischemic cardiomyopathy ([place])   . Aortic stenosis with bicuspid valve 10/25/2016  . PVC's (premature ventricular  contractions) 10/25/2016  . Hyperlipidemia 10/25/2016  . Coronary artery disease involving native coronary artery of native heart without angina pectoris 09/23/2016  . Acute blood loss anemia 09/23/2016  . History of colon cancer, stage I 08/18/2016  . Essential hypertension   . Stercoral ulcer of large intestine 04/29/2016  . Incarcerated umbilical hernia 81/09/7508  . Perforated sigmoid colon s/p colectomy/colostomy 04/26/2016 04/25/2016    Past Surgical History:  Procedure Laterality Date  . AORTIC VALVE REPLACEMENT N/A 06/08/2017   Procedure: AORTIC VALVE REPLACEMENT (AVR);  Surgeon: [name], MD;  Location: [facility];  Service: Open Heart Surgery;  Laterality: N/A;  Using 74mm Perimount Magna Ease Pericardial Bioprosthesis Aortic Valve  . COLOSTOMY CLOSURE N/A 08/18/2016   Procedure: COLOSTOMY CLOSURE OPEN PROCEDURE;  Surgeon: Armandina Gemma, MD;  Location: [facility];  Service: General;  Laterality: N/A;  . LAPAROTOMY N/A 04/25/2016   Procedure: EXPLORATORY LAPAROTOMY WITH SIGMOID COLECTOMY, COLOSTOMY;  Surgeon: Armandina Gemma, MD;  Location: [facility];  Service: General;  Laterality: N/A;  . LEFT HEART CATH AND CORONARY ANGIOGRAPHY N/A 11/03/2016   Procedure: Left Heart Cath and Coronary Angiography;  Surgeon: Burnell Blanks, MD;  Location: [facility];  Service: Cardiovascular;  Laterality: N/A;  . MELANOMA EXCISION    . PARTIAL COLECTOMY Right 08/18/2016   Procedure: RIGHT COLECTOMY;  Surgeon: Armandina Gemma, MD;  Location: [facility];  Service: General;  Laterality: Right;  .  TEE WITHOUT CARDIOVERSION N/A 06/08/2017   Procedure: TRANSESOPHAGEAL ECHOCARDIOGRAM (TEE);  Surgeon: [name], MD;  Location: [facility];  Service: Open Heart Surgery;  Laterality: N/A;        Home Medications    Prior to Admission medications   Medication Sig Start Date End Date Taking? Authorizing Provider  aspirin EC 81 MG tablet Take 81 mg by mouth daily.   Yes [provider]  cetirizine  (ZYRTEC) 10 MG tablet Take 10 mg by mouth daily as needed for allergies.   Yes [provider]  ezetimibe (ZETIA) 10 MG tablet TAKE ONE TABLET BY MOUTH DAILY 10/23/18  Yes Skeet Latch, MD  levothyroxine (SYNTHROID, LEVOTHROID) 50 MCG tablet TAKE 1 TABLET BY MOUTH DAILY 10/23/18  Yes [name], MD  losartan (COZAAR) 50 MG tablet Take 1 tablet (50 mg total) by mouth daily. 09/18/18  Yes Skeet Latch, MD  metoprolol succinate (TOPROL-XL) 25 MG 24 hr tablet TAKE ONE TABLET BY MOUTH TWICE A DAY 07/25/18  Yes Skeet Latch, MD  pravastatin (PRAVACHOL) 10 MG tablet TAKE ONE TABLET BY MOUTH DAILY 09/20/18  Yes Skeet Latch, MD  traZODone (DESYREL) 50 MG tablet TAKE ONE-HALF TO ONE TABLET BY MOUTH AT BEDTIME AS NEEDED FOR SLEEP Patient taking differently: Take 25 mg by mouth at bedtime as needed for sleep.  11/12/18  Yes [name], MD  famotidine (PEPCID) 20 MG tablet Take 20 mg by mouth daily as needed for heartburn or indigestion.    [provider]  ibuprofen (ADVIL,MOTRIN) 600 MG tablet Take 1 tablet (600 mg total) by mouth every 8 (eight) hours as needed. 12/23/18   Jola Schmidt, MD  nitroGLYCERIN (NITRODUR - DOSED IN MG/24 HR) 0.2 mg/hr patch Place 1/4 to 1/2 of a patch over affected region. Remove and replace once daily.  Slightly alter skin placement daily Patient not taking: Reported on 10/30/2018 04/03/18   Gerda Diss, DO  ondansetron (ZOFRAN ODT) 8 MG disintegrating tablet Take 1 tablet (8 mg total) by mouth every 8 (eight) hours as needed for nausea or vomiting. 12/23/18   Jola Schmidt, MD  oxyCODONE-acetaminophen (PERCOCET/ROXICET) 5-325 MG tablet Take 1 tablet by mouth every 4 (four) hours as needed for severe pain. 12/23/18   Jola Schmidt, MD  sildenafil (VIAGRA) 50 MG tablet Take 1 tablet (50 mg total) by mouth daily as needed. Patient taking differently: Take 50 mg by mouth daily as needed for erectile dysfunction.  04/16/18   Skeet Latch, MD  tamsulosin (FLOMAX) 0.4 MG CAPS capsule Take 1 capsule (0.4 mg total) by mouth daily. 12/23/18   Jola Schmidt, MD    Family History Family History  Problem Relation Age of Onset  . Dementia Mother   . Kidney disease Mother   . Vascular Disease Mother   . CAD Mother   . Heart attack Father   . Alcohol abuse Father   . Cirrhosis Father   . Stroke Maternal Grandmother   . Stroke Paternal Grandmother     Social History Social History   Tobacco Use  . Smoking status: Never Smoker  . Smokeless tobacco: Never Used  Substance Use Topics  . Alcohol use: Yes    Alcohol/week: 21.0 - 24.0 standard drinks    Types: 21 - 24 Standard drinks or equivalent per week    Comment: 2-3 glasses of wine daily   . Drug use: No     Allergies   Patient has no known allergies.   Review  of Systems Review of Systems  All other systems reviewed and are negative.    Physical Exam Updated Vital Signs BP (!) 153/101   Pulse 69   Temp 98.7 F (37.1 C) (Oral)   Resp 16   Ht 5\' 6"  (1.676 m)   Wt 74.8 kg   SpO2 98%   BMI 26.63 kg/m   Physical Exam Vitals signs and nursing note reviewed.  Constitutional:      Appearance: He is well-developed.  HENT:     Head: Normocephalic and atraumatic.  Neck:     Musculoskeletal: Normal range of motion.  Cardiovascular:     Rate and Rhythm: Normal rate and regular rhythm.     Heart sounds: Normal heart sounds.  Pulmonary:     Effort: Pulmonary effort is normal. No respiratory distress.     Breath sounds: Normal breath sounds.  Abdominal:     General: There is no distension.     Palpations: Abdomen is soft.     Tenderness: There is no abdominal tenderness.  Genitourinary:    Comments: No inguinal hernia present Musculoskeletal: Normal range of motion.  Skin:    General: Skin is warm and dry.  Neurological:     Mental Status: He is alert and oriented to person, place, and time.  Psychiatric:        Judgment: Judgment normal.       ED Treatments / Results  Labs (all labs ordered are listed, but only abnormal results are displayed) Labs Reviewed  COMPREHENSIVE METABOLIC PANEL - Abnormal; Notable for the following components:      Result Value   Glucose, Bld 149 (*)    ALT 52 (*)    All other components within normal limits  URINALYSIS, ROUTINE W REFLEX MICROSCOPIC - Abnormal; Notable for the following components:   Hgb urine dipstick MODERATE (*)    Bacteria, UA RARE (*)    All other components within normal limits  LIPASE, BLOOD  CBC    EKG None  Radiology Ct Renal Stone Study  Result Date: 12/23/2018 CLINICAL DATA:  68 y/o  M; left flank pain. EXAM: CT ABDOMEN AND PELVIS WITHOUT CONTRAST TECHNIQUE: Multidetector CT imaging of the abdomen and pelvis was performed following the standard protocol without IV contrast. COMPARISON:  04/25/2016 CT abdomen and pelvis. FINDINGS: Lower chest: No acute abnormality. Hepatobiliary: No focal liver abnormality is seen. Cholelithiasis. No gallbladder wall thickening or biliary ductal dilatation. Pancreas: Unremarkable. No pancreatic ductal dilatation or surrounding inflammatory changes. Spleen: Normal in size without focal abnormality. Adrenals/Urinary Tract: Mild left hydronephrosis with 2 mm obstructing stone in the distal left ureter approximately 2 cm upstream to the ureterovesicular junction (series 4, image 81). Mild left-sided perinephric stranding. No additional urinary stone, hydronephrosis, or focal kidney lesion identified on this noncontrast examination. There is wall thickening and fatty hypertrophy of the wall of the bladder. Stomach/Bowel: Colectomy postsurgical changes with patent anastomoses. No obstructive or inflammatory changes of the bowel. Normal appearance of the stomach. Vascular/Lymphatic: Aortic atherosclerosis. No enlarged abdominal or pelvic lymph nodes. Reproductive: Unremarkable. Other: No abdominal wall hernia or abnormality. No abdominopelvic  ascites. Chronic postsurgical changes within the anterior abdominal wall. Musculoskeletal: Mild chronic anterior superior L5 endplate deformity. No acute osseous abnormality is evident. IMPRESSION: 1. Mild left hydronephrosis and perinephric stranding with 2 mm obstructing stone in the distal left ureter approximately 2 cm upstream to the ureterovesicular junction. 2. Cholelithiasis. 3. Aortic Atherosclerosis (ICD10-I70.0). Electronically Signed   By: Edgardo Roys.D.  On: 12/23/2018 22:02    Procedures Procedures (including critical care time)  Medications Ordered in ED Medications  ketorolac (TORADOL) 15 MG/ML injection 15 mg (15 mg Intravenous Given 12/23/18 2117)     Initial Impression / Assessment and Plan / ED Course  I have reviewed the triage vital signs and the nursing notes.  Pertinent labs & imaging results that were available during my care of the patient were reviewed by me and considered in my medical decision making (see chart for details).        Patient with a distal left ureteral stone.  Pain controlled here in the emergency department.  CT scan personally reviewed by myself and demonstrates a 2 mm distal left ureteral stone with left-sided mild hydronephrosis.  Feels much better.  Standard stone precautions given.  Discharged home with standard medications.  Outpatient urology follow-up.  Patient understands the importance of close urology follow-up and understands return to emergency department for new or worsening symptoms  Final Clinical Impressions(s) / ED Diagnoses   Final diagnoses:  Left ureteral stone    ED Discharge Orders         Ordered    ondansetron (ZOFRAN ODT) 8 MG disintegrating tablet  Every 8 hours PRN     12/23/18 2228    oxyCODONE-acetaminophen (PERCOCET/ROXICET) 5-325 MG tablet  Every 4 hours PRN     12/23/18 2228    tamsulosin (FLOMAX) 0.4 MG CAPS capsule  Daily     12/23/18 2228    ibuprofen (ADVIL,MOTRIN) 600 MG tablet   Every 8 hours PRN     12/23/18 2228           Jola Schmidt, MD 12/23/18 2252

## 2018-12-23 NOTE — ED Triage Notes (Signed)
Pt arrived stating he has had left lower quadrant abdominal pain and left testicular pain for the last couple hours, pt states he has a history of colon issues and is concerned but the testicular pain is new. Pt took 200mg  ibuprofen an hour ago.

## 2018-12-24 ENCOUNTER — Encounter: Payer: Self-pay | Admitting: Family Medicine

## 2018-12-24 DIAGNOSIS — N201 Calculus of ureter: Secondary | ICD-10-CM | POA: Diagnosis not present

## 2018-12-31 ENCOUNTER — Other Ambulatory Visit: Payer: Self-pay | Admitting: Cardiovascular Disease

## 2018-12-31 NOTE — Telephone Encounter (Signed)
Losartan refilled 

## 2019-01-03 ENCOUNTER — Other Ambulatory Visit: Payer: Self-pay | Admitting: Cardiovascular Disease

## 2019-01-03 NOTE — Telephone Encounter (Signed)
Losartan 50 mg refilled. 

## 2019-01-09 ENCOUNTER — Other Ambulatory Visit: Payer: Self-pay | Admitting: Family Medicine

## 2019-01-23 DIAGNOSIS — D2262 Melanocytic nevi of left upper limb, including shoulder: Secondary | ICD-10-CM | POA: Diagnosis not present

## 2019-01-23 DIAGNOSIS — L821 Other seborrheic keratosis: Secondary | ICD-10-CM | POA: Diagnosis not present

## 2019-01-23 DIAGNOSIS — Z85820 Personal history of malignant melanoma of skin: Secondary | ICD-10-CM | POA: Diagnosis not present

## 2019-01-23 DIAGNOSIS — D2272 Melanocytic nevi of left lower limb, including hip: Secondary | ICD-10-CM | POA: Diagnosis not present

## 2019-03-08 ENCOUNTER — Ambulatory Visit (INDEPENDENT_AMBULATORY_CARE_PROVIDER_SITE_OTHER): Payer: Medicare Other | Admitting: Physician Assistant

## 2019-03-08 ENCOUNTER — Encounter: Payer: Self-pay | Admitting: Physician Assistant

## 2019-03-08 ENCOUNTER — Ambulatory Visit (INDEPENDENT_AMBULATORY_CARE_PROVIDER_SITE_OTHER): Payer: Medicare Other

## 2019-03-08 ENCOUNTER — Other Ambulatory Visit: Payer: Self-pay

## 2019-03-08 VITALS — BP 111/71 | HR 65 | Temp 97.5°F | Wt 169.5 lb

## 2019-03-08 DIAGNOSIS — Z23 Encounter for immunization: Secondary | ICD-10-CM

## 2019-03-08 DIAGNOSIS — S60511A Abrasion of right hand, initial encounter: Secondary | ICD-10-CM | POA: Diagnosis not present

## 2019-03-08 MED ORDER — DOXYCYCLINE HYCLATE 100 MG PO CAPS: 100.0000 mg | ORAL_CAPSULE | Freq: Two times a day (BID) | ORAL | 0 refills | Status: DC

## 2019-03-08 NOTE — Progress Notes (Signed)
I have discussed the procedure for the virtual visit with the patient who has given consent to proceed with assessment and treatment.   Kurstin Dimarzo S Izaha Shughart, CMA     

## 2019-03-08 NOTE — Progress Notes (Signed)
Administered Td vaccine, TENIVAC 0.5 mL, per Raiford Noble ,PA IM left arm. Patient tolerated well.

## 2019-03-08 NOTE — Progress Notes (Signed)
Virtual Visit via Video   I connected with patient on 03/08/19 at  2:00 PM EDT by a video enabled telemedicine application and verified that I am speaking with the correct person using two identifiers.  Location patient: Home Location provider: [facility], Office Persons participating in the virtual visit: Patient, Provider, PA-Student [name] ([name] Brown)  I discussed the limitations of evaluation and management by telemedicine and the availability of in person appointments. The patient expressed understanding and agreed to proceed.  Subjective:   HPI:   Patient presents via [facility] today c/o scratch of right hand occurring 2 days ago when he cut himself on a rusty nail. Notes initial pain and tenderness at the area with some clear drainage. Notes this seems a little better today but is concerned for infection, especially giving heart history. Denies fever, chills, malaise or fatigue. Is unsure of last tetanus. Notes he has been keeping the area clean and dry. Has applied a small amount of triple antibiotic ointment to the area.   ROS:   See pertinent positives and negatives per HPI.  Patient Active Problem List   Diagnosis Date Noted  . Hypothyroid 09/24/2018  . OSA on CPAP 09/18/2018  . Ascending aortic aneurysm ([place]) 09/18/2018  . Rotator cuff syndrome of left shoulder 04/03/2018  . PAF (paroxysmal atrial fibrillation) ([place]) 06/13/2017  . S/P AVR (aortic valve replacement) 06/08/2017  . Non-ischemic cardiomyopathy ([place])   . Aortic stenosis with bicuspid valve 10/25/2016  . PVC's (premature ventricular contractions) 10/25/2016  . Hyperlipidemia 10/25/2016  . Coronary artery disease involving native coronary artery of native heart without angina pectoris 09/23/2016  . Acute blood loss anemia 09/23/2016  . History of colon cancer, stage I 08/18/2016  . Essential hypertension   . Stercoral ulcer of large intestine 04/29/2016  . Incarcerated umbilical  hernia 83/66/2947  . Perforated sigmoid colon s/p colectomy/colostomy 04/26/2016 04/25/2016    Social History   Tobacco Use  . Smoking status: Never Smoker  . Smokeless tobacco: Never Used  Substance Use Topics  . Alcohol use: Yes    Alcohol/week: 21.0 - 24.0 standard drinks    Types: 21 - 24 Standard drinks or equivalent per week    Comment: 2-3 glasses of wine daily     Current Outpatient Medications:  .  aspirin EC 81 MG tablet, Take 81 mg by mouth daily., Disp: , Rfl:  .  cetirizine (ZYRTEC) 10 MG tablet, Take 10 mg by mouth daily as needed for allergies., Disp: , Rfl:  .  ezetimibe (ZETIA) 10 MG tablet, TAKE ONE TABLET BY MOUTH DAILY, Disp: 90 tablet, Rfl: 3 .  famotidine (PEPCID) 20 MG tablet, Take 20 mg by mouth daily as needed for heartburn or indigestion., Disp: , Rfl:  .  ibuprofen (ADVIL,MOTRIN) 600 MG tablet, Take 1 tablet (600 mg total) by mouth every 8 (eight) hours as needed., Disp: 15 tablet, Rfl: 0 .  levothyroxine (SYNTHROID) 50 MCG tablet, TAKE ONE TABLET BY MOUTH DAILY, Disp: 90 tablet, Rfl: 0 .  losartan (COZAAR) 50 MG tablet, TAKE ONE TABLET BY MOUTH DAILY NEW DOSE, STOP THE 25MG  TABLETS, Disp: 90 tablet, Rfl: 1 .  metoprolol succinate (TOPROL-XL) 25 MG 24 hr tablet, TAKE ONE TABLET BY MOUTH TWICE A DAY, Disp: 180 tablet, Rfl: 2 .  nitroGLYCERIN (NITRODUR - DOSED IN MG/24 HR) 0.2 mg/hr patch, Place 1/4 to 1/2 of a patch over affected region. Remove and replace once daily.  Slightly alter skin placement daily, Disp: 30  patch, Rfl: 1 .  ondansetron (ZOFRAN ODT) 8 MG disintegrating tablet, Take 1 tablet (8 mg total) by mouth every 8 (eight) hours as needed for nausea or vomiting., Disp: 10 tablet, Rfl: 0 .  oxyCODONE-acetaminophen (PERCOCET/ROXICET) 5-325 MG tablet, Take 1 tablet by mouth every 4 (four) hours as needed for severe pain., Disp: 15 tablet, Rfl: 0 .  pravastatin (PRAVACHOL) 10 MG tablet, TAKE ONE TABLET BY MOUTH DAILY, Disp: 90 tablet, Rfl: 3 .  sildenafil  (VIAGRA) 50 MG tablet, Take 1 tablet (50 mg total) by mouth daily as needed. (Patient taking differently: Take 50 mg by mouth daily as needed for erectile dysfunction. ), Disp: 10 tablet, Rfl: 0 .  tamsulosin (FLOMAX) 0.4 MG CAPS capsule, Take 1 capsule (0.4 mg total) by mouth daily., Disp: 10 capsule, Rfl: 0 .  traZODone (DESYREL) 50 MG tablet, TAKE ONE-HALF TO ONE TABLET BY MOUTH AT BEDTIME AS NEEDED FOR SLEEP (Patient taking differently: Take 25 mg by mouth at bedtime as needed for sleep. ), Disp: 30 tablet, Rfl: 0  No Known Allergies  Objective:   BP 111/71   Pulse 65   Temp (!) 97.5 F (36.4 C) (Oral)   Wt 169 lb 8 oz (76.9 kg)   BMI 27.36 kg/m   Patient is well-developed, well-nourished in no acute distress.  Resting comfortably in chair at home.  Head is normocephalic, atraumatic.  No labored breathing.  Speech is clear and coherent with logical contest.  Patient is alert and oriented at baseline.  1 x 1 cm area of superficial scratch noted of R hand, just proximal to 2nd MCP joint. No evidence of surrounding erythema or swelling noted.   Assessment and Plan:   1. Scratch of hand, right, initial encounter Mild. Routine healing thus far. Patient noting improvement since yesterday. No sign of active infection at present but giving patient concern will give script for Doxycycline to start if he notes increased redness, tenderness, warmth or drainage.  2. Need for Td vaccine Patient placed on nurse schedule today for Td to be updated.     Leeanne Rio, PA-C 03/08/2019

## 2019-03-08 NOTE — Progress Notes (Signed)
TD given as directed by this provider.   Leeanne Rio, PA-C

## 2019-03-26 ENCOUNTER — Other Ambulatory Visit: Payer: Self-pay

## 2019-03-26 ENCOUNTER — Ambulatory Visit (INDEPENDENT_AMBULATORY_CARE_PROVIDER_SITE_OTHER): Payer: Medicare Other | Admitting: Family Medicine

## 2019-03-26 ENCOUNTER — Ambulatory Visit (INDEPENDENT_AMBULATORY_CARE_PROVIDER_SITE_OTHER): Payer: Medicare Other

## 2019-03-26 ENCOUNTER — Ambulatory Visit (HOSPITAL_COMMUNITY): Payer: Medicare Other | Attending: Cardiology

## 2019-03-26 ENCOUNTER — Encounter: Payer: Self-pay | Admitting: Family Medicine

## 2019-03-26 VITALS — Ht 66.0 in | Wt 168.2 lb

## 2019-03-26 DIAGNOSIS — I480 Paroxysmal atrial fibrillation: Secondary | ICD-10-CM | POA: Diagnosis not present

## 2019-03-26 DIAGNOSIS — I10 Essential (primary) hypertension: Secondary | ICD-10-CM

## 2019-03-26 DIAGNOSIS — E7849 Other hyperlipidemia: Secondary | ICD-10-CM

## 2019-03-26 DIAGNOSIS — G44321 Chronic post-traumatic headache, intractable: Secondary | ICD-10-CM

## 2019-03-26 DIAGNOSIS — I712 Thoracic aortic aneurysm, without rupture: Secondary | ICD-10-CM | POA: Insufficient documentation

## 2019-03-26 DIAGNOSIS — E039 Hypothyroidism, unspecified: Secondary | ICD-10-CM | POA: Diagnosis not present

## 2019-03-26 DIAGNOSIS — Z952 Presence of prosthetic heart valve: Secondary | ICD-10-CM | POA: Insufficient documentation

## 2019-03-26 DIAGNOSIS — I7121 Aneurysm of the ascending aorta, without rupture: Secondary | ICD-10-CM

## 2019-03-26 DIAGNOSIS — G4452 New daily persistent headache (NDPH): Secondary | ICD-10-CM | POA: Diagnosis not present

## 2019-03-26 LAB — LIPID PANEL
Cholesterol: 149 mg/dL (ref 0–200)
HDL: 49.6 mg/dL (ref >39.00)
LDL Cholesterol: 68 mg/dL (ref 0–99)
NonHDL: 99.36
Total CHOL/HDL Ratio: 3
Triglycerides: 155 mg/dL — ABNORMAL HIGH (ref 0.0–149.0)
VLDL: 31 mg/dL (ref 0.0–40.0)

## 2019-03-26 LAB — CBC WITH DIFFERENTIAL/PLATELET
Basophils Absolute: 0 10*3/uL (ref 0.0–0.1)
Basophils Relative: 0.5 % (ref 0.0–3.0)
Eosinophils Absolute: 0.1 10*3/uL (ref 0.0–0.7)
Eosinophils Relative: 1.6 % (ref 0.0–5.0)
HCT: 47.2 % (ref 39.0–52.0)
Hemoglobin: 16 g/dL (ref 13.0–17.0)
Lymphocytes Relative: 21 % (ref 12.0–46.0)
Lymphs Abs: 0.9 10*3/uL (ref 0.7–4.0)
MCHC: 34 g/dL (ref 30.0–36.0)
MCV: 98 fl (ref 78.0–100.0)
Monocytes Absolute: 0.4 10*3/uL (ref 0.1–1.0)
Monocytes Relative: 9.9 % (ref 3.0–12.0)
Neutro Abs: 3 10*3/uL (ref 1.4–7.7)
Neutrophils Relative %: 67 % (ref 43.0–77.0)
Platelets: 186 10*3/uL (ref 150.0–400.0)
RBC: 4.81 Mil/uL (ref 4.22–5.81)
RDW: 13.1 % (ref 11.5–15.5)
WBC: 4.5 10*3/uL (ref 4.0–10.5)

## 2019-03-26 LAB — BASIC METABOLIC PANEL
BUN: 25 mg/dL — ABNORMAL HIGH (ref 6–23)
CO2: 29 mEq/L (ref 19–32)
Calcium: 9.4 mg/dL (ref 8.4–10.5)
Chloride: 101 mEq/L (ref 96–112)
Creatinine, Ser: 1.08 mg/dL (ref 0.40–1.50)
GFR: 67.88 mL/min (ref >60.00)
Glucose, Bld: 115 mg/dL — ABNORMAL HIGH (ref 70–99)
Potassium: 4.4 mEq/L (ref 3.5–5.1)
Sodium: 137 mEq/L (ref 135–145)

## 2019-03-26 LAB — HEPATIC FUNCTION PANEL
ALT: 43 U/L (ref 0–53)
AST: 29 U/L (ref 0–37)
Albumin: 4.6 g/dL (ref 3.5–5.2)
Alkaline Phosphatase: 70 U/L (ref 39–117)
Bilirubin, Direct: 0.1 mg/dL (ref 0.0–0.3)
Total Bilirubin: 0.9 mg/dL (ref 0.2–1.2)
Total Protein: 6.6 g/dL (ref 6.0–8.3)

## 2019-03-26 LAB — ECHOCARDIOGRAM COMPLETE
Height: 66 in
Weight: 2692 oz

## 2019-03-26 LAB — TSH: TSH: 1.23 u[IU]/mL (ref 0.35–4.50)

## 2019-03-26 NOTE — Progress Notes (Signed)
Virtual Visit via Video   I connected with patient on 03/26/19 at 10:00 AM EDT by a video enabled telemedicine application and verified that I am speaking with the correct person using two identifiers.  Location patient: Home Location provider: [facility], Office Persons participating in the virtual visit: Patient, Provider, [place] (Jess B)  I discussed the limitations of evaluation and management by telemedicine and the availability of in person appointments. The patient expressed understanding and agreed to proceed.  Subjective:   HPI:   HTN- chronic problem, on Losartan 50mg  daily, Metoprolol 25mg  BID.  Denies CP, SOB, edema.  Hyperlipidemia- chronic problem.  On Pravastatin 10mg  daily and Zetia 10mg  daily.  No abd pain, N/V.  Hypothyroid- chronic problem, on Levothyroxine 34mcg daily.  + fatigue.    HA- pt reports severity of sxs will wax and wane.  sxs started late last year.  No dizziness.  sxs will worsen if he shakes head or bumps head.  Increased fatigue.  No visual changes.  HA is consistently behind his eyes- 'feels kinda sinus-y'.  No nausea/vomiting.  Had normal MRI Oct 2019.  Has banged head twice while hiking.  ROS:   See pertinent positives and negatives per HPI.  Patient Active Problem List   Diagnosis Date Noted  . Hypothyroid 09/24/2018  . OSA on CPAP 09/18/2018  . Ascending aortic aneurysm ([place]) 09/18/2018  . Rotator cuff syndrome of left shoulder 04/03/2018  . PAF (paroxysmal atrial fibrillation) ([place]) 06/13/2017  . S/P AVR (aortic valve replacement) 06/08/2017  . Non-ischemic cardiomyopathy ([place])   . Aortic stenosis with bicuspid valve 10/25/2016  . PVC's (premature ventricular contractions) 10/25/2016  . Hyperlipidemia 10/25/2016  . Coronary artery disease involving native coronary artery of native heart without angina pectoris 09/23/2016  . Acute blood loss anemia 09/23/2016  . History of colon cancer, stage I 08/18/2016  . Essential  hypertension   . Stercoral ulcer of large intestine 04/29/2016  . Incarcerated umbilical hernia 08/67/6195  . Perforated sigmoid colon s/p colectomy/colostomy 04/26/2016 04/25/2016    Social History   Tobacco Use  . Smoking status: Never Smoker  . Smokeless tobacco: Never Used  Substance Use Topics  . Alcohol use: Yes    Alcohol/week: 21.0 - 24.0 standard drinks    Types: 21 - 24 Standard drinks or equivalent per week    Comment: 2-3 glasses of wine daily      No Known Allergies  Objective:   Ht 5\' 6"  (1.676 m)   Wt 168 lb 4 oz (76.3 kg)   BMI 27.16 kg/m  AAOx3, NAD NCAT, EOMI No obvious CN deficits Coloring WNL Pt is able to speak clearly, coherently without shortness of breath or increased work of breathing.  Thought process is linear.  Mood is appropriate.   Assessment and Plan:   HTN- chronic problem.  Unable to get BP reading today.  Has chronic daily HA since January but this doesn't sound consistent w/ HTN as his HA is frontal.  Check labs.  No anticipated med changes.  Hyperlipidemia- chronic problem.  Tolerating statin w/o difficulty.  Check labs.  Adjust meds prn   Hypothyroid- chronic problem.  + fatigue.  Check labs.  Adjust meds prn   HA- bulk of visit was spent discussing his 7+ months of HAs.  He had normal brain MRI in October and has seen neurology for dizziness but did not have HA at that time.  HA developed around the 1st of the year and has been constant  and daily since.  Severity will wax and wane- worse w/ hitting or jarring his head.  He does report that he hit his head twice while hiking- striking his forehead both times.  On ASA but no other blood thinners at this time.  Will get non contrast head CT to assess for chronic bleed.  If CT WNL, will need to go back to neurology.  Pt expressed understanding and is in agreement w/ plan.    [name], MD 03/26/2019

## 2019-03-26 NOTE — Progress Notes (Signed)
I have discussed the procedure for the virtual visit with the patient who has given consent to proceed with assessment and treatment.   Ayslin Kundert L Atalie Oros, CMA     

## 2019-03-27 ENCOUNTER — Encounter: Payer: Self-pay | Admitting: General Practice

## 2019-03-29 NOTE — Addendum Note (Signed)
Addended by: Katina Dung on: 03/29/2019 03:20 PM   Modules accepted: Orders

## 2019-03-31 ENCOUNTER — Other Ambulatory Visit: Payer: Self-pay | Admitting: Cardiovascular Disease

## 2019-03-31 DIAGNOSIS — I10 Essential (primary) hypertension: Secondary | ICD-10-CM

## 2019-04-09 ENCOUNTER — Encounter: Payer: Self-pay | Admitting: *Deleted

## 2019-04-09 ENCOUNTER — Other Ambulatory Visit: Payer: Self-pay

## 2019-04-09 ENCOUNTER — Ambulatory Visit
Admission: RE | Admit: 2019-04-09 | Discharge: 2019-04-09 | Disposition: A | Discharge status: Home / Self Care | Payer: Medicare Other | Source: Ambulatory Visit | Attending: Family Medicine | Admitting: Family Medicine

## 2019-04-09 DIAGNOSIS — G44321 Chronic post-traumatic headache, intractable: Secondary | ICD-10-CM

## 2019-04-09 DIAGNOSIS — G4452 New daily persistent headache (NDPH): Secondary | ICD-10-CM

## 2019-04-09 DIAGNOSIS — G44309 Post-traumatic headache, unspecified, not intractable: Secondary | ICD-10-CM | POA: Diagnosis not present

## 2019-04-10 ENCOUNTER — Other Ambulatory Visit: Payer: Self-pay | Admitting: Family Medicine

## 2019-04-11 ENCOUNTER — Telehealth: Payer: Self-pay | Admitting: *Deleted

## 2019-04-11 DIAGNOSIS — I7121 Aneurysm of the ascending aorta, without rupture: Secondary | ICD-10-CM

## 2019-04-11 DIAGNOSIS — I712 Thoracic aortic aneurysm, without rupture: Secondary | ICD-10-CM

## 2019-04-11 NOTE — Telephone Encounter (Signed)
Per [employer]  Message sent to schedulers to arrange CT and visit    Please schedule this gentleman up for a CT angiogram aorta for diagnosis of bicuspid aortic valve and dilated ascending aorta. Please set him up for a virtual visit with me or Dr. Oval Linsey after the scan is scheduled.     Thanks,  [place]

## 2019-04-15 ENCOUNTER — Encounter: Payer: Self-pay | Admitting: Family Medicine

## 2019-04-30 ENCOUNTER — Encounter: Payer: Self-pay | Admitting: Neurology

## 2019-04-30 ENCOUNTER — Ambulatory Visit (INDEPENDENT_AMBULATORY_CARE_PROVIDER_SITE_OTHER): Payer: Medicare Other | Admitting: Neurology

## 2019-04-30 ENCOUNTER — Other Ambulatory Visit: Payer: Self-pay

## 2019-04-30 VITALS — BP 137/87 | HR 59 | Ht 66.0 in | Wt 175.0 lb

## 2019-04-30 DIAGNOSIS — Z9989 Dependence on other enabling machines and devices: Secondary | ICD-10-CM

## 2019-04-30 DIAGNOSIS — R42 Dizziness and giddiness: Secondary | ICD-10-CM

## 2019-04-30 DIAGNOSIS — R51 Headache: Secondary | ICD-10-CM

## 2019-04-30 DIAGNOSIS — G2581 Restless legs syndrome: Secondary | ICD-10-CM

## 2019-04-30 DIAGNOSIS — G4733 Obstructive sleep apnea (adult) (pediatric): Secondary | ICD-10-CM

## 2019-04-30 DIAGNOSIS — G4761 Periodic limb movement disorder: Secondary | ICD-10-CM | POA: Diagnosis not present

## 2019-04-30 DIAGNOSIS — R519 Headache, unspecified: Secondary | ICD-10-CM

## 2019-04-30 MED ORDER — ROPINIROLE HCL 0.25 MG PO TABS: ORAL_TABLET | ORAL | 5 refills | Status: DC

## 2019-04-30 NOTE — Patient Instructions (Signed)
Your sleep study showed significant leg twitching.  He also have some restless leg symptoms. Let's try Requip (generic name: ropinirole) 0.25 mg: Take one pill each night 1 week, then 2 pills each night for 1 week, then 3 pills each night thereafter. Take 90-120 minutes before projected bedtime. Common side effects reported are: Sedation, sleepiness, nausea, vomiting, and rare side effects are confusion, hallucinations, swelling in legs, and abnormal behaviors, including impulse control problems, which can manifest as excessive eating, obsessions with food or gambling, or hypersexuality. Please continue using your CPAP regularly. While your insurance requires that you use CPAP at least 4 hours each night on 70% of the nights, I recommend, that you not skip any nights and use it throughout the night if you can. Getting used to CPAP and staying with the treatment long term does take time and patience and discipline. Untreated obstructive sleep apnea when it is moderate to severe can have an adverse impact on cardiovascular health and raise her risk for heart disease, arrhythmias, hypertension, congestive heart failure, stroke and diabetes. Untreated obstructive sleep apnea causes sleep disruption, nonrestorative sleep, and sleep deprivation. This can have an impact on your day to day functioning and cause daytime sleepiness and impairment of cognitive function, memory loss, mood disturbance, and problems focussing. Using CPAP regularly can improve these symptoms.  Keep up the good work with your CPAP! Please try to drink 6-8 cups of water per day.

## 2019-04-30 NOTE — Progress Notes (Signed)
Subjective:    Patient ID: [name] Brown is a 68 y.o. male.  HPI     Interim history:   [name] as a 68 year old right-handed gentleman with an underlying medical history of aortic stenosis with heart murmur, hypertension, hyperlipidemia, history of melanoma, history of paroxysmal A. fib, reflux disease, history of perforated sigmoid colon, history of PVCs, tinnitus, and mildly overweight state, who presents for follow-up consultation of his obstructive sleep apnea, on treatment with CPAP therapy and his history of dizziness.  The patient is unaccompanied today.  He was seen by Cecille Rubin, nurse practitioner on 10/30/2018, at which time he was compliant with his CPAP.  A brain MRI in the interim in October 2019 showed no acute findings.  His baseline sleep study from 07/02/2018 showed an AHI of 9/h, REM AHI of 16.7/h, O2 nadir of 73%. Today, 04/30/2019: I reviewed his CPAP compliance data from 03/30/2019 through 04/28/2019 which is a total of 30 days, during which time he used his CPAP every night with percent use days greater than 4 hours at 100%, indicating superb compliance with an average usage of 8 hours and 4 minutes, residual AHI at goal at 1.5/h, leak low with a 95th percentile at 0 L/min on a pressure of 9 cm.  He reports doing okay with the CPAP, he feels that his sleep is generally okay but sometimes it is interrupted, he may not have good quality sleep, sometimes he feels the need to take a nap in the afternoons, about 45 minutes.  He does have some restless leg symptoms from time to time.  He would be open to trying medication for restless legs.  He is still having intermittent dizzy spells, perhaps either slightly better.  He has not been taking the trazodone.  He has an appointment for cardiac follow-up in September, has an appointment for echocardiogram this month.  He likes to drink coffee, about 2-3 large, 10 ounce cups per day and estimates that he drinks about 2 10 ounce servings of  water per day.He has had some milder headaches, in the front, behind the eyes at times.  He had a recent CT Scan of the head without contrast on 04/09/2019 and I reviewed the results: IMPRESSION: No evidence of intracranial mass or acute intracranial abnormality.  Mild paranasal sinus disease as described.  He was advised by his PCP to start taking Zyrtec and Flonase.  The patient's allergies, current medications, family history, past medical history, past social history, past surgical history and problem list were reviewed and updated as appropriate.     Previously:   06/06/2018: (He) reports a several month history of a constant sense of dizziness, no actual vertigo reported, no recent falls thankfully. He has had multiple surgeries including melanoma excision, partial colectomy, colostomy closure, TEE wo cardioversion, and aortic valve replacement. He is a retired Software engineer, nonsmoker but drinks alcohol on a regular basis, about 21 to 24 drinks per week on average. He drinks caffeine in the form of coffee, 2 cups per day on average. He is working on reducing his alcohol intake. He wants to lose some weight and is striving for a BMI of 25 unless. Of note, he does not always sleep well, has sleep disruption, snores, wakes up to go to the bathroom about 2 or 3 times on an average night. He denies morning headaches. His dizziness started a few months ago and was gradual. A few years ago he had very rare symptoms and now he seems to  have more constant symptoms. He denies any spinning sensation. He has a long-standing history of tinnitus and mild hearing loss. He has not seen ENT recently. Bedtime is between 11 and 11:30 PM, rise time is generally between 7 and 8. His Epworth sleepiness score is 13 out of 24, fatigue score is 26 out of 63.  His Past Medical History Is Significant For: Past Medical History:  Diagnosis Date  . Aortic stenosis   . Ascending aortic aneurysm ([place]) 09/18/2018  . Colon  cancer ([place])   . Family history of adverse reaction to anesthesia    pt states his mother had 2 episodes of hyponatremia following anesthesia   . GERD (gastroesophageal reflux disease)   . Headache   . Heart murmur   . Hyperlipidemia 10/25/2016  . Hypertension    pt is currently not taking any medications; pt states is borderline  . Melanoma ([place])   . OSA on CPAP 09/18/2018  . PAF (paroxysmal atrial fibrillation) ([place]) 06/13/2017  . Perforated sigmoid colon ([place])   . PVC's (premature ventricular contractions) 10/25/2016  . Tinnitus   . Wears glasses     His Past Surgical History Is Significant For: Past Surgical History:  Procedure Laterality Date  . AORTIC VALVE REPLACEMENT N/A 06/08/2017   Procedure: AORTIC VALVE REPLACEMENT (AVR);  Surgeon: [name], MD;  Location: [facility];  Service: Open Heart Surgery;  Laterality: N/A;  Using 48mm Perimount Magna Ease Pericardial Bioprosthesis Aortic Valve  . COLOSTOMY CLOSURE N/A 08/18/2016   Procedure: COLOSTOMY CLOSURE OPEN PROCEDURE;  Surgeon: Armandina Gemma, MD;  Location: [facility];  Service: General;  Laterality: N/A;  . LAPAROTOMY N/A 04/25/2016   Procedure: EXPLORATORY LAPAROTOMY WITH SIGMOID COLECTOMY, COLOSTOMY;  Surgeon: Armandina Gemma, MD;  Location: [facility];  Service: General;  Laterality: N/A;  . LEFT HEART CATH AND CORONARY ANGIOGRAPHY N/A 11/03/2016   Procedure: Left Heart Cath and Coronary Angiography;  Surgeon: Burnell Blanks, MD;  Location: [facility];  Service: Cardiovascular;  Laterality: N/A;  . MELANOMA EXCISION    . PARTIAL COLECTOMY Right 08/18/2016   Procedure: RIGHT COLECTOMY;  Surgeon: Armandina Gemma, MD;  Location: [facility];  Service: General;  Laterality: Right;  . TEE WITHOUT CARDIOVERSION N/A 06/08/2017   Procedure: TRANSESOPHAGEAL ECHOCARDIOGRAM (TEE);  Surgeon: [name], MD;  Location: [facility];  Service: Open Heart Surgery;  Laterality: N/A;    His Family History Is Significant For: Family History   Problem Relation Age of Onset  . Dementia Mother   . Kidney disease Mother   . Vascular Disease Mother   . CAD Mother   . Heart attack Father   . Alcohol abuse Father   . Cirrhosis Father   . Stroke Maternal Grandmother   . Stroke Paternal Grandmother     His Social History Is Significant For: Social History   Socioeconomic History  . Marital status: Married    Spouse name: Not on file  . Number of children: Not on file  . Years of education: Not on file  . Highest education level: Not on file  Occupational History  . Occupation: Retired Lawyer  . Financial resource strain: Not on file  . Food insecurity    Worry: Never true    Inability: Never true  . Transportation needs    Medical: No    Non-medical: No  Tobacco Use  . Smoking status: Never Smoker  . Smokeless tobacco: Never Used  Substance and Sexual Activity  .  Alcohol use: Yes    Alcohol/week: 21.0 - 24.0 standard drinks    Types: 21 - 24 Standard drinks or equivalent per week    Comment: 2-3 glasses of wine daily   . Drug use: No  . Sexual activity: Not on file  Lifestyle  . Physical activity    Days per week: 4 days    Minutes per session: 30 min  . Stress: Not at all  Relationships  . Social Herbalist on phone: Not on file    Gets together: Not on file    Attends religious service: Not on file    Active member of club or organization: Not on file    Attends meetings of clubs or organizations: Not on file    Relationship status: Married  Other Topics Concern  . Not on file  Social History Narrative   Pt lives in [place] with his wife, they are active.      Pt lives at home with his wife.    His Allergies Are:  No Known Allergies:   His Current Medications Are:  :  Review of Systems:  Out of a complete 14 point review of systems, all are reviewed and negative with the exception of these symptoms as listed below: Review of Systems  Neurological:       Pt  presents today to discuss his cpap. Pt reports that his cpap is going well. Pt reports his dizziness is still bothering him. Pt is also complaining of headaches.    Objective:  Neurological Exam  Physical Exam Physical Examination:   Vitals:   04/30/19 1304  BP: 137/87  Pulse: (!) 59    General Examination: The patient is a very pleasant 68 y.o. male in no acute distress. He appears well-developed and well-nourished and well groomed.   HEENT: Normocephalic, atraumatic, pupils are equal, round and reactive to light and accommodation. Corrective eyeglasses in place. Extraocular tracking is good without limitation to gaze excursion or nystagmus noted. Normal smooth pursuit is noted. Hearing is grossly intact. Face is symmetric with normal facial animation and normal facial sensation. Speech is clear with no dysarthria noted. There is no hypophonia. There is no lip, neck/head, jaw or voice tremor. Neck shows FROM. There are no carotid bruits on auscultation. Oropharynx exam reveals: mild mouth dryness, adequate dental hygiene and moderate airway crowding. Tongue protrudes centrally and palate elevates symmetrically.  Chest: Clear to auscultation without wheezing, rhonchi or crackles noted.  Heart: S1+S2+0, slightly irregular (?PVCs) with prominent second heart sound and 2/6 systolic murmur noted.   Abdomen: Soft, non-tender and non-distended with normal bowel sounds appreciated on auscultation.  Extremities: There is no pitting edema in the distal lower extremities bilaterally. Pedal pulses are intact.  Skin: Warm and dry without trophic changes noted.  Musculoskeletal: exam reveals no obvious joint deformities, tenderness or joint swelling or erythema.   Neurologically:  Mental status: The patient is awake, alert and oriented in all 4 spheres. His immediate and remote memory, attention, language skills and fund of knowledge are appropriate. There is no evidence of aphasia,  agnosia, apraxia or anomia. Speech is clear with normal prosody and enunciation. Thought process is linear. Mood is normal and affect is normal.  Cranial nerves II - XII are as described above under HEENT exam.  Motor exam: Normal bulk, strength and tone is noted. There is no drift, tremor or rebound. Fine motor skills and coordination: grossly intact.  Cerebellar testing: No dysmetria or intention  tremor. There is no truncal or gait ataxia.  Sensory exam: intact to light touch in the upper and lower extremities.  Gait, station and balance: He stands easily. No veering to one side is noted. No leaning to one side is noted. Posture is age-appropriate and stance is narrow based. Gait shows normal stride length and normal pace. No problems turning are noted.  Assessment and Plan:   In summary, Fordyce Lepak is a very pleasant 68 year old male with an underlying medical history of aortic stenosis with heart murmur, hypertension, hyperlipidemia, history of melanoma, history of paroxysmal A. fib, reflux disease, history of perforated sigmoid colon, history of PVCs, tinnitus, and mildly overweight state, who presents for For follow-up consultation of his dizziness.  He had a sleep study in October 2019 which showed overall mild deep apnea but O2 nadir of 73% and moderate REM related sleep apnea.  He had significant PLM's during the baseline sleep study with minimal to mild arousals.  He has been established on CPAP therapy with full compliance.  He feels slightly better as far as his dizziness.  MRI scan in October 2019 showed no significant findings.  He also had a more recent CT which showed some chronic sinus related changes.  He has had some recent headaches in the past 6 or 7 months.  He has a remote history of recurrent headaches as a child and as anAdolescent.  His neurological exam is nonfocal.  He is commended for his CPAP adherence.  He is advised to try medication for PLMD/restless leg syndrome.  Start  with Requip 0.25 mg strength 1 pill each evening about an hour or 2 before bedtime.  He is advised to increase this gradually to up to 3 pills at night for total of 0.75 mg. His heart was slightly irregular today, probably more in keeping with PVCs, he had PVCs noted during the sleep study as well.  He has an echocardiogram routinely scheduled for this month and a follow-up with his cardiologist pending for next month.  He is advised to follow-up with me in about 4 months, sooner if needed.  He is advised to increase his water intake as he well enough.  I answered all his questions today and he was in agreement. I spent 25 minutes in total face-to-face time with the patient, more than 50% of which was spent in counseling and coordination of care, reviewing test results, reviewing medication and discussing or reviewing the diagnosis of dizziness, RLS, OSA, the prognosis and treatment options. Pertinent laboratory and imaging test results that were available during this visit with the patient were reviewed by me and considered in my medical decision making (see chart for details).

## 2019-05-06 ENCOUNTER — Ambulatory Visit (INDEPENDENT_AMBULATORY_CARE_PROVIDER_SITE_OTHER)
Admission: RE | Admit: 2019-05-06 | Discharge: 2019-05-06 | Disposition: A | Discharge status: Home / Self Care | Payer: Medicare Other | Source: Ambulatory Visit | Attending: Internal Medicine | Admitting: Internal Medicine

## 2019-05-06 ENCOUNTER — Other Ambulatory Visit: Payer: Self-pay

## 2019-05-06 DIAGNOSIS — I712 Thoracic aortic aneurysm, without rupture: Secondary | ICD-10-CM | POA: Diagnosis not present

## 2019-05-06 DIAGNOSIS — I77810 Thoracic aortic ectasia: Secondary | ICD-10-CM | POA: Diagnosis not present

## 2019-05-06 DIAGNOSIS — I7121 Aneurysm of the ascending aorta, without rupture: Secondary | ICD-10-CM

## 2019-05-06 MED ORDER — IOHEXOL 350 MG/ML SOLN
100.0000 mL | Freq: Once | INTRAVENOUS | Status: AC | PRN
  Administered 2019-05-06: 100 mL via INTRAVENOUS

## 2019-06-08 DIAGNOSIS — Z23 Encounter for immunization: Secondary | ICD-10-CM | POA: Diagnosis not present

## 2019-06-14 ENCOUNTER — Encounter: Payer: Self-pay | Admitting: Cardiovascular Disease

## 2019-06-14 ENCOUNTER — Telehealth (INDEPENDENT_AMBULATORY_CARE_PROVIDER_SITE_OTHER): Payer: Medicare Other | Admitting: Cardiovascular Disease

## 2019-06-14 VITALS — BP 120/74 | HR 66 | Ht 66.0 in | Wt 175.4 lb

## 2019-06-14 DIAGNOSIS — I77819 Aortic ectasia, unspecified site: Secondary | ICD-10-CM

## 2019-06-14 DIAGNOSIS — Z952 Presence of prosthetic heart valve: Secondary | ICD-10-CM

## 2019-06-14 DIAGNOSIS — I10 Essential (primary) hypertension: Secondary | ICD-10-CM

## 2019-06-14 NOTE — Progress Notes (Signed)
Virtual Visit via Telephone Note   This visit type was conducted due to national recommendations for restrictions regarding the COVID-19 Pandemic (e.g. social distancing) in an effort to limit this patient's exposure and mitigate transmission in our community.  Due to his co-morbid illnesses, this patient is at least at moderate risk for complications without adequate follow up.  This format is felt to be most appropriate for this patient at this time.  The patient did not have access to video technology/had technical difficulties with video requiring transitioning to audio format only (telephone).  All issues noted in this document were discussed and addressed.  No physical exam could be performed with this format.  Please refer to the patient's chart for his  consent to telehealth for [facility].   Date:  06/14/2019   ID:  [name] Brown, DOB [birth year], MRN [id number]  Patient Location: Home Provider Location: Office  PCP:  [name], MD  Cardiologist:  Skeet Latch, MD  Electrophysiologist:  None   Evaluation Performed:  Follow-Up Visit  Chief Complaint:  Follow up  History of Present Illness:    [name] Brown is a 68 y.o. male with non-obstructive CAD, chronic systolic and diastolic heart failure (LVEF 30-35% improved to 45-50%), aortic stenosis s/p bioprosthetic aortic valve,  and colon cancer s/p R colectomy who presents for follow up.  [name] Brown saw Dr. [name] on 09/23/16 and was noted to have coronary atherosclerosis on chest xray.  He had an echo 10/12/16 that revealed LVEF 30-35% with grade 1 diastolic dysfunction.  His aortic valve was heavily calcified and likely bicuspid.  Although the mean gradient was 14 mmHg, it was felt that he likely had moderate aortic stenosis given his reduced systolic function and leaflet restriction.   He was started on aspirin and referred to cardiology for evaluation.  He was referred for cardiac catheterization 10/2016 that  revealed mild-mod CAD and LVEF 45-50%. [name] Brown was referred for a repeat echo 01/17/17 that revealed LVEF 30-35% with grade 1 diastolic dysfunction. There was fusion of the right and noncoronary cusps and leaflet mobility was severely restricted. The mean gradient was 16 mmHg.    He had a dobutamine stress echo where his gradient increased to 38 mmHg with dobutamine infusion.  He underwent AVR with a 9mm Edwards Magna-Ease pericardial valve 06/08/17.  His hospitalization was complicated by atrial fibrillation and he was started on amiodarone which was discontinued 3 months post-op.  Since his last appointment [name] Brown had a repeat echocardiogram 03/2019 that revealed LVEF 60 to 65%.  The mean gradient across his aortic valve was 13 mmHg.  There has been concern by echo for an ascending aortic aneurysm up to 4.3 cm.  He was referred for CTA that revealed his ascending aorta was 3.6 cm.  He has been doing well.  He has no exertional chest pain or shortness of breath.  He had a sleep study due to poor sleep quality.  He has known obstructive sleep apnea and uses his CPAP regularly.  He was also found to have restless leg syndrome.  He is started on Requip and this does seem to help.  He has not been going to the gym lately due to the coronavirus.  He does continue to walk some and has no exertional symptoms.  He has rare episodes of PVCs.  [name] Brown was treated for colon cancer with surgery only and did not require chemotherapy.  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     The patient does not have symptoms concerning for COVID-19 infection (fever, chills, cough, or new shortness of breath).    Past Medical History:  Diagnosis Date  . Aortic stenosis   . Ascending aortic aneurysm ([place]) 09/18/2018  . Colon cancer ([place])   . Family history of adverse reaction to anesthesia    pt states his mother had 2 episodes of hyponatremia following anesthesia   .  GERD (gastroesophageal reflux disease)   . Headache   . Heart murmur   . Hyperlipidemia 10/25/2016  . Hypertension    pt is currently not taking any medications; pt states is borderline  . Melanoma ([place])   . OSA on CPAP 09/18/2018  . PAF (paroxysmal atrial fibrillation) ([place]) 06/13/2017  . Perforated sigmoid colon ([place])   . PVC's (premature ventricular contractions) 10/25/2016  . Tinnitus   . Wears glasses    Past Surgical History:  Procedure Laterality Date  . AORTIC VALVE REPLACEMENT N/A 06/08/2017   Procedure: AORTIC VALVE REPLACEMENT (AVR);  Surgeon: [name], MD;  Location: [place];  Service: Open Heart Surgery;  Laterality: N/A;  Using 76mm Perimount Magna Ease Pericardial Bioprosthesis Aortic Valve  . COLOSTOMY CLOSURE N/A 08/18/2016   Procedure: COLOSTOMY CLOSURE OPEN PROCEDURE;  Surgeon: Armandina Gemma, MD;  Location: [facility];  Service: General;  Laterality: N/A;  . LAPAROTOMY N/A 04/25/2016   Procedure: EXPLORATORY LAPAROTOMY WITH SIGMOID COLECTOMY, COLOSTOMY;  Surgeon: Armandina Gemma, MD;  Location: [facility];  Service: General;  Laterality: N/A;  . LEFT HEART CATH AND CORONARY ANGIOGRAPHY N/A 11/03/2016   Procedure: Left Heart Cath and Coronary Angiography;  Surgeon: Burnell Blanks, MD;  Location: [place] [facility];  Service: Cardiovascular;  Laterality: N/A;  . MELANOMA EXCISION    . PARTIAL COLECTOMY Right 08/18/2016   Procedure: RIGHT COLECTOMY;  Surgeon: Armandina Gemma, MD;  Location: [facility];  Service: General;  Laterality: Right;  . TEE WITHOUT CARDIOVERSION N/A 06/08/2017   Procedure: TRANSESOPHAGEAL ECHOCARDIOGRAM (TEE);  Surgeon: [name], MD;  Location: [place];  Service: Open Heart Surgery;  Laterality: N/A;       Current Outpatient Medications:  .  aspirin EC 81 MG tablet, Take 81 mg by mouth daily., Disp: , Rfl:  .  cetirizine (ZYRTEC) 10 MG tablet, Take 10 mg by mouth daily as needed for allergies., Disp: , Rfl:  .  ezetimibe (ZETIA) 10 MG tablet, TAKE ONE  TABLET BY MOUTH DAILY, Disp: 90 tablet, Rfl: 3 .  famotidine (PEPCID) 20 MG tablet, Take 20 mg by mouth daily as needed for heartburn or indigestion., Disp: , Rfl:  .  fluticasone (FLONASE) 50 MCG/ACT nasal spray, Place into both nostrils daily., Disp: , Rfl:  .  levothyroxine (SYNTHROID) 50 MCG tablet, TAKE ONE TABLET BY MOUTH DAILY, Disp: 90 tablet, Rfl: 0 .  losartan (COZAAR) 50 MG tablet, Take 50 mg by mouth daily., Disp: , Rfl:  .  metoprolol succinate (TOPROL-XL) 25 MG 24 hr tablet, TAKE ONE TABLET BY MOUTH TWICE A DAY, Disp: 180 tablet, Rfl: 3 .  pravastatin (PRAVACHOL) 10 MG tablet, TAKE ONE TABLET BY MOUTH DAILY, Disp: 90 tablet, Rfl: 3 .  rOPINIRole (REQUIP) 0.25 MG tablet, 1 pill nightly x 1 week, then 2 pills nightly x 1 w, then 3 pills nightly thereafter. 90-120 min before bedtime., Disp: 90 tablet, Rfl: 5 .  sildenafil (VIAGRA) 50 MG tablet, Take 1 tablet (50 mg total) by  mouth daily as needed. (Patient taking differently: Take 50 mg by mouth daily as needed for erectile dysfunction. ), Disp: 10 tablet, Rfl: 0 .  traZODone (DESYREL) 50 MG tablet, TAKE ONE-HALF TO ONE TABLET BY MOUTH AT BEDTIME AS NEEDED FOR SLEEP (Patient taking differently: Take 25 mg by mouth at bedtime as needed for sleep. ), Disp: 30 tablet, Rfl: 0  Allergies:   Patient has no known allergies.   Social History   Tobacco Use  . Smoking status: Never Smoker  . Smokeless tobacco: Never Used  Substance Use Topics  . Alcohol use: Yes    Alcohol/week: 21.0 - 24.0 standard drinks    Types: 21 - 24 Standard drinks or equivalent per week    Comment: 2-3 glasses of wine daily   . Drug use: No     Family Hx: The patient's family history includes Alcohol abuse in his father; CAD in his mother; Cirrhosis in his father; Dementia in his mother; Heart attack in his father; Kidney disease in his mother; Stroke in his maternal grandmother and paternal grandmother; Vascular Disease in his mother.  ROS:   Please see the  history of present illness.    All other systems reviewed and are negative.   Prior CV studies:   The following studies were reviewed today:  Echo 03/2019: IMPRESSIONS   1. The left ventricle has normal systolic function with an ejection fraction of 60-65%. The cavity size was normal. Left ventricular diastolic Doppler parameters are consistent with impaired relaxation.  2. The right ventricle has normal systolic function. The cavity was normal. There is no increase in right ventricular wall thickness.  SUMMARY   When compared to the prior study from12/19/2019 there is no significant change. Transaortic gradients across the bioprosthetic valve remain within upper normal limit.  Echo 01/17/17: Study Conclusions  - Left ventricle: The cavity size was normal. Wall thickness was normal. Systolic function was moderately to severely reduced. The estimated ejection fraction was in the range of 30% to 35%. Moderate diffuse hypokinesis with no identifiable regional variations. Doppler parameters are consistent with abnormal left ventricular relaxation (grade 1 diastolic dysfunction). - Ventricular septum: Septal motion showed paradox. These changes are consistent with a left bundle branch block. - Aortic valve: Possibly bicuspid; severely thickened, severely calcified leaflets; fusion of the right-noncoronary commissure. Anterior cusp mobility was severely restricted. Transvalvular velocity was increased, due to low cardiac output. There was moderate to severe stenosis. There was mild regurgitation. - Right ventricle: Systolic function was mildly reduced.  Impressions:  - Suspect low gradient severe aortic stenosis.  Recommendations: Consider dobutamine echo.  LHC 11/03/16:   Mid RCA to Dist RCA lesion, 10 %stenosed.  RPDA lesion, 10 %stenosed.  Ost Cx to Prox Cx lesion, 40 %stenosed.  Ost Ramus to Ramus lesion, 20 %stenosed.  Mid LAD lesion, 20  %stenosed.  Prox LAD lesion, 30 %stenosed.  The left ventricular ejection fraction is 45-50% by visual estimate.  There is mild left ventricular systolic dysfunction.  LV end diastolic pressure is normal.  There is no mitral valve regurgitation.  Labs/Other Tests and Data Reviewed:    EKG:  No ECG reviewed.  Recent Labs: 03/26/2019: ALT 43; BUN 25; Creatinine, Ser 1.08; Hemoglobin 16.0; Platelets 186.0; Potassium 4.4; Sodium 137; TSH 1.23   Recent Lipid Panel Lab Results  Component Value Date/Time   CHOL 149 03/26/2019 10:47 AM   CHOL 157 02/22/2018 09:11 AM   TRIG 155.0 (H) 03/26/2019 10:47 AM   HDL 49.60  03/26/2019 10:47 AM   HDL 60 02/22/2018 09:11 AM   CHOLHDL 3 03/26/2019 10:47 AM   LDLCALC 68 03/26/2019 10:47 AM   LDLCALC 75 02/22/2018 09:11 AM    Wt Readings from Last 3 Encounters:  06/14/19 175 lb 6.4 oz (79.6 kg)  04/30/19 175 lb (79.4 kg)  03/26/19 168 lb 4 oz (76.3 kg)     Objective:    Vital Signs:  BP 120/74   Pulse 66   Ht 5\' 6"  (1.676 m)   Wt 175 lb 6.4 oz (79.6 kg)   BMI 28.31 kg/m    VITAL SIGNS:  reviewed GEN:  no acute distress RESPIRATORY:  respirations unlabored NEURO:  alert and oriented x 3, no obvious focal deficit PSYCH:  normal affect  ASSESSMENT & PLAN:    # Chronic systolic and diastolic heart failure:   LVEF has increased to 60 to 65% status post aortic valve replacement.  He is doing well and euvolemic.  # Rate related LBBB:  Mr. Jolliff developed sinus tachycardia with exercise and a rate related bundle branch block.  No further investigation needed.  He has no significant CAD as noted on left heart catheterization prior to/2018.  # Non-obstructive CAD:  # Hyperlipidemia:  Continue aspirin and  Ezetimibe.  He had elevated LFTs with rosuvastatin.    LDL was 68 on 03/2019.  Continue aspirin, ezetimibe, and pravastatin.  # Mild-moderate AS:   # Bicuspid aortic valve s/p bioprosthetic AVR: Mr. Al is doing well after aortic  valve replacement.  Gradients were mildly elevated on his postoperative echo.  Mean gradient was 13 mmHg on his most recent echo.  He is doing well.  He needs antibiotic prophylaxis for dental procedures.  # Hypertension:  Blood pressure well-controlled on losartan and metoprolol.  # PVCs:  Stable on metoprolol.  # Ascending aorta aneurysm: 3.7 cm on CT 04/2019.  We will get an aorta MRA next year.  COVID-19 Education: The signs and symptoms of COVID-19 were discussed with the patient and how to seek care for testing (follow up with PCP or arrange E-visit).  The importance of social distancing was discussed today.  Time:   Today, I have spent 16 minutes with the patient with telehealth technology discussing the above problems.     Medication Adjustments/Labs and Tests Ordered: Current medicines are reviewed at length with the patient today.  Concerns regarding medicines are outlined above.   Tests Ordered: No orders of the defined types were placed in this encounter.   Medication Changes: No orders of the defined types were placed in this encounter.   Follow Up:  Virtual Visit or In Person in 1 year(s)  Signed, Skeet Latch, MD  06/14/2019 12:56 PM    [place]

## 2019-06-14 NOTE — Patient Instructions (Signed)
Medication Instructions:  Your physician recommends that you continue on your current medications as directed. Please refer to the Current Medication list given to you today.  If you need a refill on your cardiac medications before your next appointment, please call your pharmacy.   Lab work: NONE  Testing/Procedures: NONE  Follow-Up: At [facility], you and your health needs are our priority.  As part of our continuing mission to provide you with exceptional heart care, we have created designated Provider Care Teams.  These Care Teams include your primary Cardiologist (physician) and Advanced Practice Providers (APPs -  Physician Assistants and Nurse Practitioners) who all work together to provide you with the care you need, when you need it. You will need a follow up appointment in 12 months.  Please call our office 2 months in advance to schedule this appointment.  You may see Tiffany Rackerby, MD or one of the following Advanced Practice Providers on your designated Care Team:   Luke Kilroy, PA-C Krista Kroeger, PA-C . Callie Goodrich, PA-C    

## 2019-06-29 ENCOUNTER — Other Ambulatory Visit: Payer: Self-pay | Admitting: Cardiovascular Disease

## 2019-06-29 ENCOUNTER — Other Ambulatory Visit: Payer: Self-pay | Admitting: Family Medicine

## 2019-07-31 DIAGNOSIS — Z85820 Personal history of malignant melanoma of skin: Secondary | ICD-10-CM | POA: Diagnosis not present

## 2019-07-31 DIAGNOSIS — D2261 Melanocytic nevi of right upper limb, including shoulder: Secondary | ICD-10-CM | POA: Diagnosis not present

## 2019-07-31 DIAGNOSIS — D225 Melanocytic nevi of trunk: Secondary | ICD-10-CM | POA: Diagnosis not present

## 2019-07-31 DIAGNOSIS — D692 Other nonthrombocytopenic purpura: Secondary | ICD-10-CM | POA: Diagnosis not present

## 2019-07-31 DIAGNOSIS — L858 Other specified epidermal thickening: Secondary | ICD-10-CM | POA: Diagnosis not present

## 2019-07-31 DIAGNOSIS — L821 Other seborrheic keratosis: Secondary | ICD-10-CM | POA: Diagnosis not present

## 2019-07-31 DIAGNOSIS — D2272 Melanocytic nevi of left lower limb, including hip: Secondary | ICD-10-CM | POA: Diagnosis not present

## 2019-08-08 ENCOUNTER — Other Ambulatory Visit: Payer: Self-pay | Admitting: Cardiovascular Disease

## 2019-08-12 ENCOUNTER — Telehealth: Payer: Self-pay | Admitting: Family Medicine

## 2019-08-12 NOTE — Telephone Encounter (Signed)
Called pt and provided him with 2 diagnosis codes: M25.512 - acute pain of L shoulder and M75.102 - Rotator cuff syndrome of L shoulder.  Advised pt to let us know if he needs any additional information.

## 2019-08-12 NOTE — Telephone Encounter (Signed)
See note, please contact patient to advise.

## 2019-08-12 NOTE — Telephone Encounter (Signed)
Pt stated he really needs the diagnosis code to give to his insurance. Pt stated if he can not be reached to please update his [facility] account with the diagnosis code.

## 2019-08-12 NOTE — Telephone Encounter (Signed)
Copied from [place] [phone]. Topic: General - Other >> Aug 09, 2019 11:57 AM Yvette Rack wrote: Reason for CRM: Pt stated he saw Dr. Paulla Fore last year and he was prescribed nitroGLYCERIN (NITRODUR - DOSED IN MG/24 HR) 0.2 mg/hr patch and he needs the diagnosis code for that prescription. Cb# [id number]

## 2019-08-13 DIAGNOSIS — H4312 Vitreous hemorrhage, left eye: Secondary | ICD-10-CM | POA: Diagnosis not present

## 2019-08-19 ENCOUNTER — Encounter (INDEPENDENT_AMBULATORY_CARE_PROVIDER_SITE_OTHER): Payer: Medicare Other | Admitting: Ophthalmology

## 2019-08-19 DIAGNOSIS — H35033 Hypertensive retinopathy, bilateral: Secondary | ICD-10-CM

## 2019-08-19 DIAGNOSIS — H4312 Vitreous hemorrhage, left eye: Secondary | ICD-10-CM

## 2019-08-19 DIAGNOSIS — H43813 Vitreous degeneration, bilateral: Secondary | ICD-10-CM

## 2019-08-19 DIAGNOSIS — H4323 Crystalline deposits in vitreous body, bilateral: Secondary | ICD-10-CM

## 2019-08-19 DIAGNOSIS — I10 Essential (primary) hypertension: Secondary | ICD-10-CM

## 2019-08-19 DIAGNOSIS — H2513 Age-related nuclear cataract, bilateral: Secondary | ICD-10-CM

## 2019-09-07 ENCOUNTER — Other Ambulatory Visit: Payer: Self-pay | Admitting: Cardiovascular Disease

## 2019-09-09 ENCOUNTER — Other Ambulatory Visit: Payer: Self-pay

## 2019-09-09 MED ORDER — PRAVASTATIN SODIUM 10 MG PO TABS: 10.0000 mg | ORAL_TABLET | Freq: Every day | ORAL | 3 refills | Status: DC

## 2019-09-18 ENCOUNTER — Other Ambulatory Visit: Payer: Self-pay

## 2019-09-18 ENCOUNTER — Encounter: Payer: Self-pay | Admitting: Neurology

## 2019-09-18 ENCOUNTER — Ambulatory Visit (INDEPENDENT_AMBULATORY_CARE_PROVIDER_SITE_OTHER): Payer: Medicare Other | Admitting: Neurology

## 2019-09-18 VITALS — BP 124/77 | HR 64 | Temp 96.9°F | Ht 66.0 in | Wt 182.0 lb

## 2019-09-18 DIAGNOSIS — G4761 Periodic limb movement disorder: Secondary | ICD-10-CM

## 2019-09-18 DIAGNOSIS — Z9989 Dependence on other enabling machines and devices: Secondary | ICD-10-CM

## 2019-09-18 DIAGNOSIS — G4733 Obstructive sleep apnea (adult) (pediatric): Secondary | ICD-10-CM | POA: Diagnosis not present

## 2019-09-18 DIAGNOSIS — G2581 Restless legs syndrome: Secondary | ICD-10-CM

## 2019-09-18 MED ORDER — ROPINIROLE HCL 0.25 MG PO TABS: 0.7500 mg | ORAL_TABLET | Freq: Every day | ORAL | 3 refills | Status: DC

## 2019-09-18 NOTE — Progress Notes (Signed)
Subjective:    Patient ID: [name] Brown is a 69 y.o. male.  HPI     Interim history:   [name] Brown as a 69 year old right-handed gentleman with an underlying medical history of aortic stenosis with heart murmur, hypertension, hyperlipidemia, history of melanoma, history of paroxysmal A. fib, reflux disease, history of perforated sigmoid colon, history of PVCs, tinnitus, and mildly overweight state, who presents for follow-up consultation of his obstructive sleep apnea, on treatment with CPAP therapy and treatment of his RLS. The patient is unaccompanied today.  I last saw him on 04/30/2019, at which time he was still having intermittent dizzy spells, he was no longer on trazodone.  He was compliant with CPAP, still had some daytime somnolence.  Since he had significant PLM's on his sleep study and had some restless leg type symptoms I suggested we start him on Requip with gradual titration.  Today, 09/18/2019: I reviewed his CPAP compliance data from 08/18/2019 through 09/16/2019, which is a total of 30 days, during which time he used his machine every night with percent use days greater than 4 hours at 100%, indicating superb compliance with an average usage of 8 hours and 12 minutes, residual AHI at goal at 2/h, leak low with a 95th percentile at 1.1 L/min on a pressure of 9 cm.  He reports that he was able to eventually uptitrate the Requip, he had some daytime somnolence from it but adapted to it well, he feels that it has been quite helpful, he feels that his sleep is much more restful now and his daytime somnolence has decreased eventually to the point that he does not take a nap any longer.  He no longer takes trazodone at night.  He is quite pleased with his ability to tolerate CPAP.  He is motivated to continue with the Requip at the current dose, denies any side effects from it other than initial somnolence from it.  He has not noticed any swelling or nausea.  He is also motivated to continue with  CPAP.  He is up-to-date with his supplies.   The patient's allergies, current medications, family history, past medical history, past social history, past surgical history and problem list were reviewed and updated as appropriate.    Previously:    He was seen by Cecille Rubin, nurse practitioner on 10/30/2018, at which time he was compliant with his CPAP.  A brain MRI in the interim in October 2019 showed no acute findings.  His baseline sleep study from 07/02/2018 showed an AHI of 9/h, REM AHI of 16.7/h, O2 nadir of 73%.  I reviewed his CPAP compliance data from 03/30/2019 through 04/28/2019 which is a total of 30 days, during which time he used his CPAP every night with percent use days greater than 4 hours at 100%, indicating superb compliance with an average usage of 8 hours and 4 minutes, residual AHI at goal at 1.5/h, leak low with a 95th percentile at 0 L/min on a pressure of 9 cm.    06/06/2018: (He) reports a several month history of a constant sense of dizziness, no actual vertigo reported, no recent falls thankfully. He has had multiple surgeries including melanoma excision, partial colectomy, colostomy closure, TEE wo cardioversion, and aortic valve replacement. He is a retired Software engineer, nonsmoker but drinks alcohol on a regular basis, about 21 to 24 drinks per week on average. He drinks caffeine in the form of coffee, 2 cups per day on average. He is working on reducing his alcohol  intake. He wants to lose some weight and is striving for a BMI of 25 unless. Of note, he does not always sleep well, has sleep disruption, snores, wakes up to go to the bathroom about 2 or 3 times on an average night. He denies morning headaches. His dizziness started a few months ago and was gradual. A few years ago he had very rare symptoms and now he seems to have more constant symptoms. He denies any spinning sensation. He has a long-standing history of tinnitus and mild hearing loss. He has not seen ENT  recently. Bedtime is between 11 and 11:30 PM, rise time is generally between 7 and 8. His Epworth sleepiness score is 13 out of 24, fatigue score is 26 out of 63.  His Past Medical History Is Significant For: Past Medical History:  Diagnosis Date  . Aortic stenosis   . Ascending aortic aneurysm ([place]) 09/18/2018  . Colon cancer ([place])   . Family history of adverse reaction to anesthesia    pt states his mother had 2 episodes of hyponatremia following anesthesia   . GERD (gastroesophageal reflux disease)   . Headache   . Heart murmur   . Hyperlipidemia 10/25/2016  . Hypertension    pt is currently not taking any medications; pt states is borderline  . Melanoma ([place])   . OSA on CPAP 09/18/2018  . PAF (paroxysmal atrial fibrillation) ([place]) 06/13/2017  . Perforated sigmoid colon ([place])   . PVC's (premature ventricular contractions) 10/25/2016  . Tinnitus   . Wears glasses     His Past Surgical History Is Significant For: Past Surgical History:  Procedure Laterality Date  . AORTIC VALVE REPLACEMENT N/A 06/08/2017   Procedure: AORTIC VALVE REPLACEMENT (AVR);  Surgeon: [name], MD;  Location: [facility];  Service: Open Heart Surgery;  Laterality: N/A;  Using 3mm Perimount Magna Ease Pericardial Bioprosthesis Aortic Valve  . COLOSTOMY CLOSURE N/A 08/18/2016   Procedure: COLOSTOMY CLOSURE OPEN PROCEDURE;  Surgeon: Armandina Gemma, MD;  Location: [facility];  Service: General;  Laterality: N/A;  . LAPAROTOMY N/A 04/25/2016   Procedure: EXPLORATORY LAPAROTOMY WITH SIGMOID COLECTOMY, COLOSTOMY;  Surgeon: Armandina Gemma, MD;  Location: [facility];  Service: General;  Laterality: N/A;  . LEFT HEART CATH AND CORONARY ANGIOGRAPHY N/A 11/03/2016   Procedure: Left Heart Cath and Coronary Angiography;  Surgeon: Burnell Blanks, MD;  Location: [facility];  Service: Cardiovascular;  Laterality: N/A;  . MELANOMA EXCISION    . PARTIAL COLECTOMY Right 08/18/2016   Procedure: RIGHT COLECTOMY;  Surgeon: Armandina Gemma, MD;  Location: [facility];  Service: General;  Laterality: Right;  . TEE WITHOUT CARDIOVERSION N/A 06/08/2017   Procedure: TRANSESOPHAGEAL ECHOCARDIOGRAM (TEE);  Surgeon: [name], MD;  Location: [facility];  Service: Open Heart Surgery;  Laterality: N/A;    His Family History Is Significant For: Family History  Problem Relation Age of Onset  . Dementia Mother   . Kidney disease Mother   . Vascular Disease Mother   . CAD Mother   . Heart attack Father   . Alcohol abuse Father   . Cirrhosis Father   . Stroke Maternal Grandmother   . Stroke Paternal Grandmother     His Social History Is Significant For: Social History   Socioeconomic History  . Marital status: Married    Spouse name: Not on file  . Number of children: Not on file  . Years of education: Not on file  . Highest education level: Not  on file  Occupational History  . Occupation: Retired Software engineer  Tobacco Use  . Smoking status: Never Smoker  . Smokeless tobacco: Never Used  Substance and Sexual Activity  . Alcohol use: Yes    Alcohol/week: 21.0 - 24.0 standard drinks    Types: 21 - 24 Standard drinks or equivalent per week    Comment: 2-3 glasses of wine daily   . Drug use: No  . Sexual activity: Not on file  Other Topics Concern  . Not on file  Social History Narrative   Pt lives in [place] with his wife, they are active.      Pt lives at home with his wife.   Social Determinants of Health   Financial Resource Strain:   . Difficulty of Paying Living Expenses: Not on file  Food Insecurity:   . Worried About Charity fundraiser in the Last Year: Not on file  . Ran Out of Food in the Last Year: Not on file  Transportation Needs:   . Lack of Transportation (Medical): Not on file  . Lack of Transportation (Non-Medical): Not on file  Physical Activity:   . Days of Exercise per Week: Not on file  . Minutes of Exercise per Session: Not on file  Stress:   . Feeling of Stress : Not on file   Social Connections:   . Frequency of Communication with Friends and Family: Not on file  . Frequency of Social Gatherings with Friends and Family: Not on file  . Attends Religious Services: Not on file  . Active Member of Clubs or Organizations: Not on file  . Attends Archivist Meetings: Not on file  . Marital Status: Not on file    His Allergies Are:  No Known Allergies:   His Current Medications Are:  Outpatient Encounter Medications as of 09/18/2019  Medication Sig  . aspirin EC 81 MG tablet Take 81 mg by mouth daily.  . cetirizine (ZYRTEC) 10 MG tablet Take 10 mg by mouth daily as needed for allergies.  [name] ezetimibe (ZETIA) 10 MG tablet TAKE ONE TABLET BY MOUTH DAILY  . famotidine (PEPCID) 20 MG tablet Take 20 mg by mouth daily as needed for heartburn or indigestion.  . fluticasone (FLONASE) 50 MCG/ACT nasal spray Place into both nostrils daily.  [name] levothyroxine (SYNTHROID) 50 MCG tablet TAKE ONE TABLET BY MOUTH DAILY  . losartan (COZAAR) 50 MG tablet Take 1 tablet (50 mg total) by mouth daily.  . metoprolol succinate (TOPROL-XL) 25 MG 24 hr tablet TAKE ONE TABLET BY MOUTH TWICE A DAY  . pravastatin (PRAVACHOL) 10 MG tablet Take 1 tablet (10 mg total) by mouth daily for 1 day.  [name] rOPINIRole (REQUIP) 0.25 MG tablet 1 pill nightly x 1 week, then 2 pills nightly x 1 w, then 3 pills nightly thereafter. 90-120 min before bedtime.  . sildenafil (VIAGRA) 50 MG tablet TAKE ONE TABLET BY MOUTH DAILY AS NEEDED  . [DISCONTINUED] traZODone (DESYREL) 50 MG tablet TAKE ONE-HALF TO ONE TABLET BY MOUTH AT BEDTIME AS NEEDED FOR SLEEP (Patient not taking: No sig reported)   No facility-administered encounter medications on file as of 09/18/2019.  :  Review of Systems:  Out of a complete 14 point review of systems, all are reviewed and negative with the exception of these symptoms as listed below: Review of Systems  Constitutional: Negative.   HENT: Negative.   Eyes: Negative.    Respiratory: Negative.   Cardiovascular: Negative.   Gastrointestinal: Negative.  Endocrine: Negative.   Genitourinary: Negative.   Musculoskeletal: Negative.   Skin: Negative.   Neurological:       Epworth Sleepiness Scale 0= would never doze 1= slight chance of dozing 2= moderate chance of dozing 3= high chance of dozing  Sitting and reading:2 Watching TV:0 Sitting inactive in a public place (ex. Theater or meeting):1 As a passenger in a car for an hour without a break:1 Lying down to rest in the afternoon:3 Sitting and talking to someone:0 Sitting quietly after lunch (no alcohol):0 In a car, while stopped in traffic:0 Total:7   Hematological: Negative.   Psychiatric/Behavioral: Negative.     Objective:  Neurological Exam  Physical Exam Physical Examination:   Vitals:   09/18/19 0935  BP: 124/77  Pulse: 64  Temp: (!) 96.9 F (36.1 C)   General Examination: The patient is a very pleasant 70 y.o. male in no acute distress. He appears well-developed and well-nourished and well groomed.   HEENT:Normocephalic, atraumatic, pupils are equal, round and reactive to light. Corrective eyeglasses in place.Extraocular tracking is good without limitation to gaze excursion or nystagmus noted. Normal smooth pursuit is noted. Hearing is grossly intact. Face is symmetric with normal facial animation and normal facial sensation. Speech is clear with no dysarthria noted. There is no hypophonia. There is no lip, neck/head, jaw or voice tremor. Neck shows FROM. There are no carotid bruits on auscultation. Oropharynx exam reveals: mild mouth dryness, adequatedental hygiene and moderateairway crowding. Tongueprotrudes centrally and palate elevates symmetrically, discoloration noted on tongue.  Chest:Clear to auscultation without wheezing, rhonchi or crackles noted.  [id number], 2/6 systolic murmur noted.  Abdomen:Soft, non-tender and non-distended with normal bowel sounds  appreciated on auscultation.  Extremities:There isnopitting edema in the distal lower extremities bilaterally.   Skin: Warm and dry without trophic changes noted.  Musculoskeletal: exam reveals no obvious joint deformities, tenderness or joint swelling or erythema.   Neurologically:  Mental status: The patient is awake, alert and oriented in all 4 spheres.Hisimmediate and remote memory, attention, language skills and fund of knowledge are appropriate. There is no evidence of aphasia, agnosia, apraxia or anomia. Speech is clear with normal prosody and enunciation. Thought process is linear. Mood is normaland affect is normal.  Cranial nerves II - XII are as described above under HEENT exam.  Motor exam: Normal bulk, strength and tone is noted. There is no tremor. Fine motor skills and coordination: grossly intact.  Cerebellar testing: No dysmetria or intention tremor. There is no truncal or gait ataxia.  Sensory exam: intact to light touch in the upper and lower extremities. Romberg neg.  Gait, station and balance:Hestands easily. No veering to one side is noted. No leaning to one side is noted. Posture is age-appropriate and stance is narrow based. Gait showsnormalstride length and normalpace. No problems turning are noted. Tandem gait good.  Assessmentand Plan:   In summary,[name] Breezeis a very pleasant 37 year oldmalewith an underlying medical history of aortic stenosis with heart murmur, hypertension, hyperlipidemia, history of melanoma, history of paroxysmal A. fib, reflux disease, history of perforated sigmoid colon, history of PVCs, tinnitus, and mildly overweight state, whopresents for follow-up consultation of his sleep apnea, RLS and dizziness.  He had a sleep study in October 2019 which showed overall mild OSA, but O2 nadir of 73%, moderate REM related sleep apnea.  He had significant PLM's during the baseline sleep study with minimal to mild arousals.  He has been  established on CPAP therapy with full compliance. Dizziness has  improved.  MRI scan in October 2019 showed no significant findings. Head CT showed chronic sinus related changes. He started Requip in August 2020 at our last appointment and has titrated up to 3 pills a night for a total dose of 0.75 mg each evening.  He is tolerating this well, had some additional issues with sedation but overall feels better with regards to his sleep consolidation, sleep quality, restless leg symptoms and daytime energy.  He does not take a nap on a regular basis any longer.  He is motivated to continue with CPAP therapy and is commended for his treatment adherence.  We mutually agreed to keep him on Requip at the current dose, I renewed his prescription and advised him to follow-up routinely in 1 year. I answered all his questions today and he was in agreement. I spent 25 minutes in total face-to-face time with the patient, more than 50% of which was spent in counseling and coordination of care, reviewing test results, reviewing medication and discussing or reviewing the diagnosis of PLMD, OSA, the prognosis and treatment options. Pertinent laboratory and imaging test results that were available during this visit with the patient were reviewed by me and considered in my medical decision making (see chart for details).

## 2019-09-18 NOTE — Patient Instructions (Signed)
Please continue using your CPAP regularly. While your insurance requires that you use CPAP at least 4 hours each night on 70% of the nights, I recommend, that you not skip any nights and use it throughout the night if you can. Getting used to CPAP and staying with the treatment long term does take time and patience and discipline. Untreated obstructive sleep apnea when it is moderate to severe can have an adverse impact on cardiovascular health and raise her risk for heart disease, arrhythmias, hypertension, congestive heart failure, stroke and diabetes. Untreated obstructive sleep apnea causes sleep disruption, nonrestorative sleep, and sleep deprivation. This can have an impact on your day to day functioning and cause daytime sleepiness and impairment of cognitive function, memory loss, mood disturbance, and problems focussing. Using CPAP regularly can improve these symptoms.  Let us continue with the Requip generic at the current dose of 0.75 mg total dose each night.  Please follow-up routinely in 1 year.

## 2019-09-26 ENCOUNTER — Encounter (INDEPENDENT_AMBULATORY_CARE_PROVIDER_SITE_OTHER): Payer: Medicare Other | Admitting: Ophthalmology

## 2019-09-26 DIAGNOSIS — I10 Essential (primary) hypertension: Secondary | ICD-10-CM | POA: Diagnosis not present

## 2019-09-26 DIAGNOSIS — H35033 Hypertensive retinopathy, bilateral: Secondary | ICD-10-CM | POA: Diagnosis not present

## 2019-09-26 DIAGNOSIS — H4312 Vitreous hemorrhage, left eye: Secondary | ICD-10-CM | POA: Diagnosis not present

## 2019-09-26 DIAGNOSIS — H43813 Vitreous degeneration, bilateral: Secondary | ICD-10-CM

## 2019-10-07 ENCOUNTER — Other Ambulatory Visit: Payer: Self-pay | Admitting: Family Medicine

## 2019-10-09 ENCOUNTER — Other Ambulatory Visit: Payer: Self-pay

## 2019-10-09 ENCOUNTER — Ambulatory Visit (INDEPENDENT_AMBULATORY_CARE_PROVIDER_SITE_OTHER): Payer: Medicare Other | Admitting: Family Medicine

## 2019-10-09 ENCOUNTER — Encounter: Payer: Self-pay | Admitting: Family Medicine

## 2019-10-09 ENCOUNTER — Ambulatory Visit (INDEPENDENT_AMBULATORY_CARE_PROVIDER_SITE_OTHER): Payer: Medicare Other

## 2019-10-09 VITALS — BP 126/72 | HR 74 | Temp 97.8°F | Ht 66.0 in | Wt 179.4 lb

## 2019-10-09 DIAGNOSIS — I5042 Chronic combined systolic (congestive) and diastolic (congestive) heart failure: Secondary | ICD-10-CM | POA: Diagnosis not present

## 2019-10-09 DIAGNOSIS — I10 Essential (primary) hypertension: Secondary | ICD-10-CM | POA: Diagnosis not present

## 2019-10-09 DIAGNOSIS — E039 Hypothyroidism, unspecified: Secondary | ICD-10-CM

## 2019-10-09 DIAGNOSIS — Z0000 Encounter for general adult medical examination without abnormal findings: Secondary | ICD-10-CM

## 2019-10-09 DIAGNOSIS — I7121 Aneurysm of the ascending aorta, without rupture: Secondary | ICD-10-CM

## 2019-10-09 DIAGNOSIS — I480 Paroxysmal atrial fibrillation: Secondary | ICD-10-CM | POA: Diagnosis not present

## 2019-10-09 DIAGNOSIS — I712 Thoracic aortic aneurysm, without rupture: Secondary | ICD-10-CM

## 2019-10-09 DIAGNOSIS — E7849 Other hyperlipidemia: Secondary | ICD-10-CM | POA: Diagnosis not present

## 2019-10-09 LAB — LIPID PANEL
Cholesterol: 165 mg/dL (ref 0–200)
HDL: 56 mg/dL (ref >39.00)
NonHDL: 109.08
Total CHOL/HDL Ratio: 3
Triglycerides: 229 mg/dL — ABNORMAL HIGH (ref 0.0–149.0)
VLDL: 45.8 mg/dL — ABNORMAL HIGH (ref 0.0–40.0)

## 2019-10-09 LAB — CBC WITH DIFFERENTIAL/PLATELET
Basophils Absolute: 0 10*3/uL (ref 0.0–0.1)
Basophils Relative: 0.7 % (ref 0.0–3.0)
Eosinophils Absolute: 0.1 10*3/uL (ref 0.0–0.7)
Eosinophils Relative: 2 % (ref 0.0–5.0)
HCT: 48.1 % (ref 39.0–52.0)
Hemoglobin: 16.3 g/dL (ref 13.0–17.0)
Lymphocytes Relative: 19.3 % (ref 12.0–46.0)
Lymphs Abs: 0.9 10*3/uL (ref 0.7–4.0)
MCHC: 33.9 g/dL (ref 30.0–36.0)
MCV: 97.6 fl (ref 78.0–100.0)
Monocytes Absolute: 0.5 10*3/uL (ref 0.1–1.0)
Monocytes Relative: 9.8 % (ref 3.0–12.0)
Neutro Abs: 3.2 10*3/uL (ref 1.4–7.7)
Neutrophils Relative %: 68.2 % (ref 43.0–77.0)
Platelets: 176 10*3/uL (ref 150.0–400.0)
RBC: 4.93 Mil/uL (ref 4.22–5.81)
RDW: 13.1 % (ref 11.5–15.5)
WBC: 4.7 10*3/uL (ref 4.0–10.5)

## 2019-10-09 LAB — BASIC METABOLIC PANEL WITH GFR
BUN: 19 mg/dL (ref 6–23)
CO2: 29 meq/L (ref 19–32)
Calcium: 9.6 mg/dL (ref 8.4–10.5)
Chloride: 101 meq/L (ref 96–112)
Creatinine, Ser: 1.07 mg/dL (ref 0.40–1.50)
GFR: 68.5 mL/min (ref >60.00)
Glucose, Bld: 90 mg/dL (ref 70–99)
Potassium: 5.1 meq/L (ref 3.5–5.1)
Sodium: 136 meq/L (ref 135–145)

## 2019-10-09 LAB — HEPATIC FUNCTION PANEL
ALT: 52 U/L (ref 0–53)
AST: 37 U/L (ref 0–37)
Albumin: 4.4 g/dL (ref 3.5–5.2)
Alkaline Phosphatase: 77 U/L (ref 39–117)
Bilirubin, Direct: 0.1 mg/dL (ref 0.0–0.3)
Total Bilirubin: 0.7 mg/dL (ref 0.2–1.2)
Total Protein: 6.6 g/dL (ref 6.0–8.3)

## 2019-10-09 LAB — LDL CHOLESTEROL, DIRECT: Direct LDL: 92 mg/dL

## 2019-10-09 LAB — TSH: TSH: 3.15 u[IU]/mL (ref 0.35–4.50)

## 2019-10-09 NOTE — Assessment & Plan Note (Signed)
Chronic problem, currently asymptomatic.  Check labs.  Adjust meds prn  

## 2019-10-09 NOTE — Assessment & Plan Note (Signed)
Chronic problem.  Well controlled.  Asymptomatic.  Check labs.  No anticipated med changes.  Will follow. 

## 2019-10-09 NOTE — Patient Instructions (Signed)
Follow up in 6 months to recheck BP and cholesterol We'll notify you of your lab results and make any changes if needed Keep up the good work on healthy diet and regular exercise- you can do it! Call with any questions or concerns Stay Safe!  Stay Healthy!!

## 2019-10-09 NOTE — Assessment & Plan Note (Signed)
Chronic problem.  Tolerating statin and Zetia w/o difficulty.  Encouraged healthy diet and regular exercise.  Check labs.  Adjust meds prn

## 2019-10-09 NOTE — Assessment & Plan Note (Signed)
Currently asymptomatic 

## 2019-10-09 NOTE — Patient Instructions (Signed)
[name] Brown , Thank you for taking time to come for your Medicare Wellness Visit. I appreciate your ongoing commitment to your health goals. Please review the following plan we discussed and let me know if I can assist you in the future.   Screening recommendations/referrals: Colorectal Screening: up to date; last colonoscopy 07/25/16  Vision and Dental Exams: Recommended annual ophthalmology exams for early detection of glaucoma and other disorders of the eye Recommended annual dental exams for proper oral hygiene  Vaccinations: Influenza vaccine: we will request records  Pneumococcal vaccine: up to date; last 03/28/18 Tdap vaccine: up to date; last 03/08/19 Shingles vaccine: Please call your insurance company to determine your out of pocket expense for the Shingrix vaccine. You may receive this vaccine at your local pharmacy. (see attached handouts)   Advanced directives: We have received a copy of your POA (Power of [place]) and/or Living Will. These documents can be located in your chart.  Goals: Recommend to drink at least 6-8 8oz glasses of water per day and consume a balanced diet rich in fresh fruits and vegetables.   Next appointment: Please schedule your Annual Wellness Visit with your Nurse Health Advisor in one year.  Preventive Care 5 Years and Older, Male Preventive care refers to lifestyle choices and visits with your health care provider that can promote health and wellness. What does preventive care include?  A yearly physical exam. This is also called an annual well check.  Dental exams once or twice a year.  Routine eye exams. Ask your health care provider how often you should have your eyes checked.  Personal lifestyle choices, including:  Daily care of your teeth and gums.  Regular physical activity.  Eating a healthy diet.  Avoiding tobacco and drug use.  Limiting alcohol use.  Practicing safe sex.  Taking low doses of aspirin every day if  recommended by your health care provider..  Taking vitamin and mineral supplements as recommended by your health care provider. What happens during an annual well check? The services and screenings done by your health care provider during your annual well check will depend on your age, overall health, lifestyle risk factors, and family history of disease. Counseling  Your health care provider may ask you questions about your:  Alcohol use.  Tobacco use.  Drug use.  Emotional well-being.  Home and relationship well-being.  Sexual activity.  Eating habits.  History of falls.  Memory and ability to understand (cognition).  Work and work Statistician. Screening  You may have the following tests or measurements:  Height, weight, and BMI.  Blood pressure.  Lipid and cholesterol levels. These may be checked every 5 years, or more frequently if you are over 64 years old.  Skin check.  Lung cancer screening. You may have this screening every year starting at age 55 if you have a 30-pack-year history of smoking and currently smoke or have quit within the past 15 years.  Fecal occult blood test (FOBT) of the stool. You may have this test every year starting at age 27.  Flexible sigmoidoscopy or colonoscopy. You may have a sigmoidoscopy every 5 years or a colonoscopy every 10 years starting at age 55.  Prostate cancer screening. Recommendations will vary depending on your family history and other risks.  Hepatitis C blood test.  Hepatitis B blood test.  Sexually transmitted disease (STD) testing.  Diabetes screening. This is done by checking your blood sugar (glucose) after you have not eaten for a while (fasting).  You may have this done every 1-3 years.  Abdominal aortic aneurysm (AAA) screening. You may need this if you are a current or former smoker.  Osteoporosis. You may be screened starting at age 65 if you are at high risk. Talk with your health care provider about  your test results, treatment options, and if necessary, the need for more tests. Vaccines  Your health care provider may recommend certain vaccines, such as:  Influenza vaccine. This is recommended every year.  Tetanus, diphtheria, and acellular pertussis (Tdap, Td) vaccine. You may need a Td booster every 10 years.  Zoster vaccine. You may need this after age 52.  Pneumococcal 13-valent conjugate (PCV13) vaccine. One dose is recommended after age 80.  Pneumococcal polysaccharide (PPSV23) vaccine. One dose is recommended after age 44. Talk to your health care provider about which screenings and vaccines you need and how often you need them. This information is not intended to replace advice given to you by your health care provider. Make sure you discuss any questions you have with your health care provider. Document Released: 09/25/2015 Document Revised: 05/18/2016 Document Reviewed: 06/30/2015 Elsevier Interactive Patient Education  2017 [place] Prevention in the Home Falls can cause injuries. They can happen to people of all ages. There are many things you can do to make your home safe and to help prevent falls. What can I do on the outside of my home?  Regularly fix the edges of walkways and driveways and fix any cracks.  Remove anything that might make you trip as you walk through a door, such as a raised step or threshold.  Trim any bushes or trees on the path to your home.  Use bright outdoor lighting.  Clear any walking paths of anything that might make someone trip, such as rocks or tools.  Regularly check to see if handrails are loose or broken. Make sure that both sides of any steps have handrails.  Any raised decks and porches should have guardrails on the edges.  Have any leaves, snow, or ice cleared regularly.  Use sand or salt on walking paths during winter.  Clean up any spills in your garage right away. This includes oil or grease spills. What  can I do in the bathroom?  Use night lights.  Install grab bars by the toilet and in the tub and shower. Do not use towel bars as grab bars.  Use non-skid mats or decals in the tub or shower.  If you need to sit down in the shower, use a plastic, non-slip stool.  Keep the floor dry. Clean up any water that spills on the floor as soon as it happens.  Remove soap buildup in the tub or shower regularly.  Attach bath mats securely with double-sided non-slip rug tape.  Do not have throw rugs and other things on the floor that can make you trip. What can I do in the bedroom?  Use night lights.  Make sure that you have a light by your bed that is easy to reach.  Do not use any sheets or blankets that are too big for your bed. They should not hang down onto the floor.  Have a firm chair that has side arms. You can use this for support while you get dressed.  Do not have throw rugs and other things on the floor that can make you trip. What can I do in the kitchen?  Clean up any spills right away.  Avoid walking  on wet floors.  Keep items that you use a lot in easy-to-reach places.  If you need to reach something above you, use a strong step stool that has a grab bar.  Keep electrical cords out of the way.  Do not use floor polish or wax that makes floors slippery. If you must use wax, use non-skid floor wax.  Do not have throw rugs and other things on the floor that can make you trip. What can I do with my stairs?  Do not leave any items on the stairs.  Make sure that there are handrails on both sides of the stairs and use them. Fix handrails that are broken or loose. Make sure that handrails are as long as the stairways.  Check any carpeting to make sure that it is firmly attached to the stairs. Fix any carpet that is loose or worn.  Avoid having throw rugs at the top or bottom of the stairs. If you do have throw rugs, attach them to the floor with carpet tape.  Make sure  that you have a light switch at the top of the stairs and the bottom of the stairs. If you do not have them, ask someone to add them for you. What else can I do to help prevent falls?  Wear shoes that:  Do not have high heels.  Have rubber bottoms.  Are comfortable and fit you well.  Are closed at the toe. Do not wear sandals.  If you use a stepladder:  Make sure that it is fully opened. Do not climb a closed stepladder.  Make sure that both sides of the stepladder are locked into place.  Ask someone to hold it for you, if possible.  Clearly mark and make sure that you can see:  Any grab bars or handrails.  First and last steps.  Where the edge of each step is.  Use tools that help you move around (mobility aids) if they are needed. These include:  Canes.  Walkers.  Scooters.  Crutches.  Turn on the lights when you go into a dark area. Replace any light bulbs as soon as they burn out.  Set up your furniture so you have a clear path. Avoid moving your furniture around.  If any of your floors are uneven, fix them.  If there are any pets around you, be aware of where they are.  Review your medicines with your doctor. Some medicines can make you feel dizzy. This can increase your chance of falling. Ask your doctor what other things that you can do to help prevent falls. This information is not intended to replace advice given to you by your health care provider. Make sure you discuss any questions you have with your health care provider. Document Released: 06/25/2009 Document Revised: 02/04/2016 Document Reviewed: 10/03/2014 Elsevier Interactive Patient Education  2017 [employer].

## 2019-10-09 NOTE — Progress Notes (Signed)
   Subjective:    Patient ID: [name] Brown, male    DOB: [birth year], 69 y.o.   MRN: [id number]  HPI HTN- chronic problem, on Metoprolol 25mg  daily, Losartan 50mg  daily.  No CP, SOB, HAs, visual changes, edema.  Hyperlipidemia- chronic problem, on Zetia 10mg , Pravastatin 10mg  daily.  Pt has gained 11 lbs since last visit.  No abd pain, N/V  Hypothyroid- chronic problem, on Levothyroxine 68mcg daily.  Pt reports energy level 'is ok'.  No changes to skin/hair/nails.  PAF/Ascending Aortic Aneurysm/CHF- following w/ Cardiology.  Currently asymptomatic.   Review of Systems For ROS see HPI   This visit occurred during the SARS-CoV-2 public health emergency.  Safety protocols were in place, including screening questions prior to the visit, additional usage of staff PPE, and extensive cleaning of exam room while observing appropriate contact time as indicated for disinfecting solutions.      Objective:   Physical Exam Vitals reviewed.  Constitutional:      General: He is not in acute distress.    Appearance: He is well-developed.  HENT:     Head: Normocephalic and atraumatic.  Eyes:     Conjunctiva/sclera: Conjunctivae normal.     Pupils: Pupils are equal, round, and reactive to light.  Neck:     Thyroid: No thyromegaly.  Cardiovascular:     Rate and Rhythm: Normal rate and regular rhythm.     Heart sounds: Murmur (very faint valvular murmur) present.  Pulmonary:     Effort: Pulmonary effort is normal. No respiratory distress.     Breath sounds: Normal breath sounds.  Abdominal:     General: Bowel sounds are normal. There is no distension.     Palpations: Abdomen is soft.  Musculoskeletal:     Cervical back: Normal range of motion and neck supple.  Lymphadenopathy:     Cervical: No cervical adenopathy.  Skin:    General: Skin is warm and dry.  Neurological:     Mental Status: He is alert and oriented to person, place, and time.     Cranial Nerves: No cranial nerve deficit.   Psychiatric:        Behavior: Behavior normal.           Assessment & Plan:

## 2019-10-09 NOTE — Assessment & Plan Note (Signed)
Chronic problem.  Following w/ Cards 

## 2019-10-09 NOTE — Progress Notes (Signed)
Subjective:   [name] Brown is a 69 y.o. male who presents for Medicare Annual/Subsequent preventive examination.  Review of Systems:   Cardiac Risk Factors include: advanced age (>42men, >56 women);hypertension;male gender    Objective:    Vitals: BP 126/72   Pulse 74   Temp 97.8 F (36.6 C) (Temporal)   Ht 5\' 6"  (1.676 m)   Wt 179 lb 6.4 oz (81.4 kg)   SpO2 96%   BMI 28.96 kg/m   Body mass index is 28.96 kg/m.  Advanced Directives 10/09/2019 12/23/2018 03/28/2018 07/25/2017 06/10/2017 06/06/2017 03/23/2017  Does Patient Have a Medical Advance Directive? Yes No Yes Yes Yes Yes Yes  Type of Advance Directive Living will;Healthcare Power of [place];Living will - [place];Living will [place];Living will [place];Living will  Does patient want to make changes to medical advance directive? No - Patient declined - - - No - Patient declined - -  Copy of [place] in Chart? Yes - validated most recent copy scanned in chart (See row information) - Yes - No - copy requested No - copy requested No - copy requested  Would patient like information on creating a medical advance directive? - No - Patient declined - - - - -    Tobacco Social History   Tobacco Use  Smoking Status Never Smoker  Smokeless Tobacco Never Used     Counseling given: Not Answered   Clinical Intake:  Pre-visit preparation completed: Yes  Pain : No/denies pain  Diabetes: No  How often do you need to have someone help you when you read instructions, pamphlets, or other written materials from your doctor or pharmacy?: 1 - Never  Interpreter Needed?: No  Information entered by :: Denman George LPN  Past Medical History:  Diagnosis Date  . Aortic stenosis   . Ascending aortic aneurysm ([facility]) 09/18/2018  . Colon cancer ([place])   . Family history of adverse reaction to anesthesia    pt states his  mother had 2 episodes of hyponatremia following anesthesia   . GERD (gastroesophageal reflux disease)   . Headache   . Heart murmur   . Hyperlipidemia 10/25/2016  . Hypertension    pt is currently not taking any medications; pt states is borderline  . Melanoma ([place])   . OSA on CPAP 09/18/2018  . PAF (paroxysmal atrial fibrillation) ([place]) 06/13/2017  . Perforated sigmoid colon ([place])   . PVC's (premature ventricular contractions) 10/25/2016  . Tinnitus   . Wears glasses    Past Surgical History:  Procedure Laterality Date  . AORTIC VALVE REPLACEMENT N/A 06/08/2017   Procedure: AORTIC VALVE REPLACEMENT (AVR);  Surgeon: [name], MD;  Location: [facility];  Service: Open Heart Surgery;  Laterality: N/A;  Using 69mm Perimount Magna Ease Pericardial Bioprosthesis Aortic Valve  . COLOSTOMY CLOSURE N/A 08/18/2016   Procedure: COLOSTOMY CLOSURE OPEN PROCEDURE;  Surgeon: Armandina Gemma, MD;  Location: [facility];  Service: General;  Laterality: N/A;  . LAPAROTOMY N/A 04/25/2016   Procedure: EXPLORATORY LAPAROTOMY WITH SIGMOID COLECTOMY, COLOSTOMY;  Surgeon: Armandina Gemma, MD;  Location: [facility];  Service: General;  Laterality: N/A;  . LEFT HEART CATH AND CORONARY ANGIOGRAPHY N/A 11/03/2016   Procedure: Left Heart Cath and Coronary Angiography;  Surgeon: Burnell Blanks, MD;  Location: [facility];  Service: Cardiovascular;  Laterality: N/A;  . MELANOMA EXCISION    . PARTIAL COLECTOMY Right 08/18/2016  Procedure: RIGHT COLECTOMY;  Surgeon: Armandina Gemma, MD;  Location: [facility];  Service: General;  Laterality: Right;  . TEE WITHOUT CARDIOVERSION N/A 06/08/2017   Procedure: TRANSESOPHAGEAL ECHOCARDIOGRAM (TEE);  Surgeon: [name], MD;  Location: [facility];  Service: Open Heart Surgery;  Laterality: N/A;   Family History  Problem Relation Age of Onset  . Dementia Mother   . Kidney disease Mother   . Vascular Disease Mother   . CAD Mother   . Heart attack Father   . Alcohol abuse Father   .  Cirrhosis Father   . Stroke Maternal Grandmother   . Stroke Paternal Grandmother    Social History   Socioeconomic History  . Marital status: Married    Spouse name: Not on file  . Number of children: Not on file  . Years of education: Not on file  . Highest education level: Not on file  Occupational History  . Occupation: Retired Software engineer  Tobacco Use  . Smoking status: Never Smoker  . Smokeless tobacco: Never Used  Substance and Sexual Activity  . Alcohol use: Yes    Alcohol/week: 21.0 - 24.0 standard drinks    Types: 21 - 24 Standard drinks or equivalent per week    Comment: 2-3 glasses of wine daily   . Drug use: No  . Sexual activity: Not on file  Other Topics Concern  . Not on file  Social History Narrative   Pt lives in [place] with his wife, they are active.      Pt lives at home with his wife.      1 Cat    Social Determinants of Health   Financial Resource Strain:   . Difficulty of Paying Living Expenses: Not on file  Food Insecurity:   . Worried About Charity fundraiser in the Last Year: Not on file  . Ran Out of Food in the Last Year: Not on file  Transportation Needs:   . Lack of Transportation (Medical): Not on file  . Lack of Transportation (Non-Medical): Not on file  Physical Activity:   . Days of Exercise per Week: Not on file  . Minutes of Exercise per Session: Not on file  Stress:   . Feeling of Stress : Not on file  Social Connections:   . Frequency of Communication with Friends and Family: Not on file  . Frequency of Social Gatherings with Friends and Family: Not on file  . Attends Religious Services: Not on file  . Active Member of Clubs or Organizations: Not on file  . Attends Archivist Meetings: Not on file  . Marital Status: Not on file    Outpatient Encounter Medications as of 10/09/2019  Medication Sig  . aspirin EC 81 MG tablet Take 81 mg by mouth daily.  . cetirizine (ZYRTEC) 10 MG tablet Take 10 mg by mouth  daily as needed for allergies.  [name] ezetimibe (ZETIA) 10 MG tablet TAKE ONE TABLET BY MOUTH DAILY  . famotidine (PEPCID) 20 MG tablet Take 20 mg by mouth daily as needed for heartburn or indigestion.  . fluticasone (FLONASE) 50 MCG/ACT nasal spray Place into both nostrils daily.  [name] levothyroxine (SYNTHROID) 50 MCG tablet TAKE ONE TABLET BY MOUTH DAILY  . losartan (COZAAR) 50 MG tablet Take 1 tablet (50 mg total) by mouth daily.  . metoprolol succinate (TOPROL-XL) 25 MG 24 hr tablet TAKE ONE TABLET BY MOUTH TWICE A DAY  . rOPINIRole (REQUIP) 0.25 MG tablet Take 3 tablets (  0.75 mg total) by mouth at bedtime.  . sildenafil (VIAGRA) 50 MG tablet TAKE ONE TABLET BY MOUTH DAILY AS NEEDED  . pravastatin (PRAVACHOL) 10 MG tablet Take 1 tablet (10 mg total) by mouth daily for 1 day.   No facility-administered encounter medications on file as of 10/09/2019.    Activities of Daily Living In your present state of health, do you have any difficulty performing the following activities: 10/09/2019 03/26/2019  Hearing? N N  Vision? N N  Difficulty concentrating or making decisions? N N  Walking or climbing stairs? N N  Dressing or bathing? N N  Doing errands, shopping? N N  Preparing Food and eating ? N -  Using the Toilet? N -  In the past six months, have you accidently leaked urine? N -  Do you have problems with loss of bowel control? N -  Managing your Medications? N -  Managing your Finances? N -  Some recent data might be hidden    Patient Care Team: [name], MD as PCP - General (Family Medicine) Skeet Latch, MD as PCP - Cardiology (Cardiology) Skeet Latch, MD as Attending Physician (Cardiology) Ladell Pier, MD as Consulting Physician (Oncology) Clarene Essex, MD as Consulting Physician (Gastroenterology) [name], MD as Consulting Physician (Dermatology) Armandina Gemma, MD as Consulting Physician (General Surgery) Star Age, MD as Consulting Physician  (Neurology) Jola Schmidt, MD as Consulting Physician (Ophthalmology)   Assessment:   This is a routine wellness examination for [place].  Exercise Activities and Dietary recommendations Current Exercise Habits: Home exercise routine, Type of exercise: walking, Time (Minutes): 45, Frequency (Times/Week): 3, Weekly Exercise (Minutes/Week): 135, Intensity: Moderate  Goals    . patient (pt-stated)     Start exercising after cardiac clearance.     . Patient Stated     Decrease weight and continue exercise.        Fall Risk Fall Risk  10/09/2019 03/26/2019 09/24/2018 03/28/2018 09/18/2017  Falls in the past year? 0 0 0 No No  Number falls in past yr: 0 0 - - -  Injury with Fall? 0 0 - - -  Follow up Falls evaluation completed;Education provided;Falls prevention discussed - - - -   Is the patient's home free of loose throw rugs in walkways, pet beds, electrical cords, etc?   yes      Grab bars in the bathroom? yes      Handrails on the stairs?   yes      Adequate lighting?   yes  Timed Get Up and Go Performed: completed and within normal timeframe; no gait abnormalities noted   Depression Screen PHQ 2/9 Scores 10/09/2019 03/26/2019 09/24/2018 03/28/2018  PHQ - 2 Score 0 0 0 0  PHQ- 9 Score - 0 0 -    Cognitive Function MMSE - Mini Mental State Exam 03/28/2018  Orientation to time 5  Orientation to Place 5  Registration 3  Attention/ Calculation 5  Recall 3  Language- name 2 objects 2  Language- repeat 1  Language- follow 3 step command 3  Language- read & follow direction 1  Write a sentence 1  Copy design 1  Total score 30        Immunization History  Administered Date(s) Administered  . Influenza,inj,Quad PF,6+ Mos 07/09/2014, 07/02/2015, 06/28/2016, 09/18/2017  . Influenza-Unspecified 06/09/2018  . Pneumococcal Conjugate-13 09/23/2016  . Pneumococcal Polysaccharide-23 03/28/2018  . Td 03/08/2019    Qualifies for Shingles Vaccine? Discussed and patient will check with  pharmacy for coverage.  Patient education handout provided   Screening Tests Health Maintenance  Topic Date Due  . INFLUENZA VACCINE  04/13/2019  . COLONOSCOPY  07/25/2026  . TETANUS/TDAP  03/07/2029  . PNA vac Low Risk Adult  Completed  . Hepatitis C Screening  Discontinued   Cancer Screenings: Lung: Low Dose CT Chest recommended if Age 77-80 years, 30 pack-year currently smoking OR have quit w/in 15years. Patient does not qualify. Colorectal: colonoscopy 07/25/16 with Dr. Watt Climes   Plan:  I have personally reviewed and addressed the Medicare Annual Wellness questionnaire and have noted the following in the patient's chart:  A. Medical and social history B. Use of alcohol, tobacco or illicit drugs  C. Current medications and supplements D. Functional ability and status E.  Nutritional status F.  Physical activity G. Advance directives H. List of other physicians I.  Hospitalizations, surgeries, and ER visits in previous 12 months J.  [place] such as hearing and vision if needed, cognitive and depression L. Referrals, records requested, and appointments- will request records from pharmacy for flu shot administration   In addition, I have reviewed and discussed with patient certain preventive protocols, quality metrics, and best practice recommendations. A written personalized care plan for preventive services as well as general preventive health recommendations were provided to patient.   Signed,  Denman George, LPN  Nurse Health Advisor   Nurse Notes: no additional

## 2019-10-10 ENCOUNTER — Ambulatory Visit: Payer: Medicare Other | Admitting: Family Medicine

## 2019-10-24 ENCOUNTER — Ambulatory Visit: Payer: Medicare Other

## 2019-11-02 ENCOUNTER — Other Ambulatory Visit: Payer: Self-pay | Admitting: Cardiovascular Disease

## 2019-11-04 DIAGNOSIS — H2513 Age-related nuclear cataract, bilateral: Secondary | ICD-10-CM | POA: Diagnosis not present

## 2019-11-04 DIAGNOSIS — H5213 Myopia, bilateral: Secondary | ICD-10-CM | POA: Diagnosis not present

## 2019-11-06 ENCOUNTER — Other Ambulatory Visit: Payer: Self-pay | Admitting: Cardiovascular Disease

## 2019-11-08 ENCOUNTER — Ambulatory Visit: Payer: Medicare Other

## 2019-12-06 ENCOUNTER — Other Ambulatory Visit: Payer: Self-pay | Admitting: Family Medicine

## 2020-01-28 DIAGNOSIS — L821 Other seborrheic keratosis: Secondary | ICD-10-CM | POA: Diagnosis not present

## 2020-01-28 DIAGNOSIS — D2261 Melanocytic nevi of right upper limb, including shoulder: Secondary | ICD-10-CM | POA: Diagnosis not present

## 2020-01-28 DIAGNOSIS — D2262 Melanocytic nevi of left upper limb, including shoulder: Secondary | ICD-10-CM | POA: Diagnosis not present

## 2020-01-28 DIAGNOSIS — D225 Melanocytic nevi of trunk: Secondary | ICD-10-CM | POA: Diagnosis not present

## 2020-01-28 DIAGNOSIS — L820 Inflamed seborrheic keratosis: Secondary | ICD-10-CM | POA: Diagnosis not present

## 2020-01-28 DIAGNOSIS — D2272 Melanocytic nevi of left lower limb, including hip: Secondary | ICD-10-CM | POA: Diagnosis not present

## 2020-01-28 DIAGNOSIS — Z85820 Personal history of malignant melanoma of skin: Secondary | ICD-10-CM | POA: Diagnosis not present

## 2020-02-24 ENCOUNTER — Other Ambulatory Visit: Payer: Self-pay | Admitting: Cardiovascular Disease

## 2020-02-24 DIAGNOSIS — I10 Essential (primary) hypertension: Secondary | ICD-10-CM

## 2020-03-05 ENCOUNTER — Other Ambulatory Visit: Payer: Self-pay | Admitting: Family Medicine

## 2020-04-16 ENCOUNTER — Ambulatory Visit (INDEPENDENT_AMBULATORY_CARE_PROVIDER_SITE_OTHER): Payer: Medicare Other | Admitting: Family Medicine

## 2020-04-16 ENCOUNTER — Other Ambulatory Visit: Payer: Self-pay | Admitting: General Practice

## 2020-04-16 ENCOUNTER — Other Ambulatory Visit: Payer: Self-pay

## 2020-04-16 ENCOUNTER — Encounter: Payer: Self-pay | Admitting: Family Medicine

## 2020-04-16 VITALS — BP 123/81 | HR 76 | Temp 97.9°F | Resp 16 | Ht 66.0 in | Wt 172.0 lb

## 2020-04-16 DIAGNOSIS — Z125 Encounter for screening for malignant neoplasm of prostate: Secondary | ICD-10-CM | POA: Diagnosis not present

## 2020-04-16 DIAGNOSIS — E7849 Other hyperlipidemia: Secondary | ICD-10-CM

## 2020-04-16 DIAGNOSIS — E039 Hypothyroidism, unspecified: Secondary | ICD-10-CM | POA: Diagnosis not present

## 2020-04-16 DIAGNOSIS — I10 Essential (primary) hypertension: Secondary | ICD-10-CM

## 2020-04-16 LAB — CBC WITH DIFFERENTIAL/PLATELET
Basophils Absolute: 0 10*3/uL (ref 0.0–0.1)
Basophils Relative: 0.5 % (ref 0.0–3.0)
Eosinophils Absolute: 0.1 10*3/uL (ref 0.0–0.7)
Eosinophils Relative: 1.8 % (ref 0.0–5.0)
HCT: 45 % (ref 39.0–52.0)
Hemoglobin: 15.4 g/dL (ref 13.0–17.0)
Lymphocytes Relative: 16.7 % (ref 12.0–46.0)
Lymphs Abs: 0.8 10*3/uL (ref 0.7–4.0)
MCHC: 34.3 g/dL (ref 30.0–36.0)
MCV: 98.7 fl (ref 78.0–100.0)
Monocytes Absolute: 0.4 10*3/uL (ref 0.1–1.0)
Monocytes Relative: 8.4 % (ref 3.0–12.0)
Neutro Abs: 3.3 10*3/uL (ref 1.4–7.7)
Neutrophils Relative %: 72.6 % (ref 43.0–77.0)
Platelets: 163 10*3/uL (ref 150.0–400.0)
RBC: 4.56 Mil/uL (ref 4.22–5.81)
RDW: 13.1 % (ref 11.5–15.5)
WBC: 4.6 10*3/uL (ref 4.0–10.5)

## 2020-04-16 LAB — LIPID PANEL
Cholesterol: 158 mg/dL (ref 0–200)
HDL: 55.1 mg/dL (ref >39.00)
LDL Cholesterol: 84 mg/dL (ref 0–99)
NonHDL: 103.25
Total CHOL/HDL Ratio: 3
Triglycerides: 98 mg/dL (ref 0.0–149.0)
VLDL: 19.6 mg/dL (ref 0.0–40.0)

## 2020-04-16 LAB — BASIC METABOLIC PANEL
BUN: 14 mg/dL (ref 6–23)
CO2: 29 mEq/L (ref 19–32)
Calcium: 9.6 mg/dL (ref 8.4–10.5)
Chloride: 103 mEq/L (ref 96–112)
Creatinine, Ser: 0.92 mg/dL (ref 0.40–1.50)
GFR: 81.42 mL/min (ref >60.00)
Glucose, Bld: 94 mg/dL (ref 70–99)
Potassium: 5.1 mEq/L (ref 3.5–5.1)
Sodium: 137 mEq/L (ref 135–145)

## 2020-04-16 LAB — HEPATIC FUNCTION PANEL
ALT: 47 U/L (ref 0–53)
AST: 34 U/L (ref 0–37)
Albumin: 4.4 g/dL (ref 3.5–5.2)
Alkaline Phosphatase: 58 U/L (ref 39–117)
Bilirubin, Direct: 0.1 mg/dL (ref 0.0–0.3)
Total Bilirubin: 0.8 mg/dL (ref 0.2–1.2)
Total Protein: 6.4 g/dL (ref 6.0–8.3)

## 2020-04-16 LAB — PSA, MEDICARE: PSA: 0.34 ng/ml (ref 0.10–4.00)

## 2020-04-16 LAB — TSH: TSH: 2.26 u[IU]/mL (ref 0.35–4.50)

## 2020-04-16 NOTE — Assessment & Plan Note (Signed)
Chronic problem.  Tolerating statin and zetia w/o difficulty.  Check labs.  Adjust meds prn

## 2020-04-16 NOTE — Assessment & Plan Note (Signed)
Chronic problem, well controlled.  Asymptomatic.  Check labs.  No anticipated med changes.  Will follow. 

## 2020-04-16 NOTE — Assessment & Plan Note (Signed)
Chronic problem.  Pt reports decreased energy and low mood at times.  Is concerned that thyroid medication needs to be adjusted.  Discussed that this could also be emotional due to COVID.  Check labs.  Adjust meds prn

## 2020-04-16 NOTE — Progress Notes (Signed)
   Subjective:    Patient ID: [name] Brown, male    DOB: 1951-06-23, 69 y.o.   MRN: [id number]  HPI HTN- chronic problem.  On Metoprolol 25mg  BID, Losartan 50mg  daily w/ good control.  No CP, SOB, HAs, visual changes, edema.  Hyperlipidemia- chronic problem, on Zetia 10mg  daily and Pravastatin 10mg  daily.  Pt is down 7 lbs since last visit w/ added exercise.  No abd pain, N/V.  Hypothyroid- chronic problem, on Levothyroxine 69mcg daily.  Pt reports decreased energy level.  Prostate cancer screen- due for repeat PSA.   Review of Systems For ROS see HPI   This visit occurred during the SARS-CoV-2 public health emergency.  Safety protocols were in place, including screening questions prior to the visit, additional usage of staff PPE, and extensive cleaning of exam room while observing appropriate contact time as indicated for disinfecting solutions.       Objective:   Physical Exam Vitals reviewed.  Constitutional:      General: He is not in acute distress.    Appearance: Normal appearance. He is well-developed.  HENT:     Head: Normocephalic and atraumatic.  Eyes:     Conjunctiva/sclera: Conjunctivae normal.     Pupils: Pupils are equal, round, and reactive to light.  Neck:     Thyroid: No thyromegaly.  Cardiovascular:     Rate and Rhythm: Normal rate and regular rhythm.     Heart sounds: Murmur (w/ valvular click) heard.   Pulmonary:     Effort: Pulmonary effort is normal. No respiratory distress.     Breath sounds: Normal breath sounds.  Abdominal:     General: Bowel sounds are normal. There is no distension.     Palpations: Abdomen is soft.  Musculoskeletal:     Cervical back: Normal range of motion and neck supple.  Lymphadenopathy:     Cervical: No cervical adenopathy.  Skin:    General: Skin is warm and dry.  Neurological:     Mental Status: He is alert and oriented to person, place, and time.     Cranial Nerves: No cranial nerve deficit.  Psychiatric:         Behavior: Behavior normal.           Assessment & Plan:

## 2020-04-16 NOTE — Patient Instructions (Signed)
Follow up in 6 months to recheck BP, cholesterol, thyroid We'll notify you of your lab results and make any changes if needed Continue to work on healthy diet and regular exercise- you're doing great! Call with any questions or concerns Stay Safe!  Stay Healthy!

## 2020-05-04 DIAGNOSIS — Z85820 Personal history of malignant melanoma of skin: Secondary | ICD-10-CM | POA: Diagnosis not present

## 2020-05-04 DIAGNOSIS — D485 Neoplasm of uncertain behavior of skin: Secondary | ICD-10-CM | POA: Diagnosis not present

## 2020-05-04 DIAGNOSIS — L820 Inflamed seborrheic keratosis: Secondary | ICD-10-CM | POA: Diagnosis not present

## 2020-05-04 DIAGNOSIS — L570 Actinic keratosis: Secondary | ICD-10-CM | POA: Diagnosis not present

## 2020-06-03 ENCOUNTER — Other Ambulatory Visit: Payer: Self-pay | Admitting: Cardiovascular Disease

## 2020-06-04 ENCOUNTER — Other Ambulatory Visit: Payer: Self-pay | Admitting: General Practice

## 2020-06-04 MED ORDER — LEVOTHYROXINE SODIUM 50 MCG PO TABS: 50.0000 ug | ORAL_TABLET | Freq: Every day | ORAL | 0 refills | Status: DC

## 2020-06-12 DIAGNOSIS — H04123 Dry eye syndrome of bilateral lacrimal glands: Secondary | ICD-10-CM | POA: Diagnosis not present

## 2020-07-02 ENCOUNTER — Other Ambulatory Visit: Payer: Self-pay

## 2020-07-02 ENCOUNTER — Encounter: Payer: Self-pay | Admitting: Cardiovascular Disease

## 2020-07-02 ENCOUNTER — Ambulatory Visit (INDEPENDENT_AMBULATORY_CARE_PROVIDER_SITE_OTHER): Payer: Medicare Other | Admitting: Cardiovascular Disease

## 2020-07-02 VITALS — BP 130/80 | HR 53 | Ht 66.0 in | Wt 168.0 lb

## 2020-07-02 DIAGNOSIS — I10 Essential (primary) hypertension: Secondary | ICD-10-CM

## 2020-07-02 DIAGNOSIS — E7800 Pure hypercholesterolemia, unspecified: Secondary | ICD-10-CM

## 2020-07-02 DIAGNOSIS — Z5181 Encounter for therapeutic drug level monitoring: Secondary | ICD-10-CM | POA: Diagnosis not present

## 2020-07-02 MED ORDER — PRAVASTATIN SODIUM 20 MG PO TABS
20.0000 mg | ORAL_TABLET | Freq: Every day | ORAL | 3 refills | Status: DC
Start: 2020-07-02 — End: 2021-08-02

## 2020-07-02 MED ORDER — PRAVASTATIN SODIUM 20 MG PO TABS: 20.0000 mg | ORAL_TABLET | Freq: Every day | ORAL | 3 refills | Status: DC

## 2020-07-02 NOTE — Patient Instructions (Addendum)
Medication Instructions:  DECREASE YOUR METOPROLOL TO ONCE DAILY   INCREASE YOUR PRAVASTATIN TO 20 MG DIALY *If you need a refill on your cardiac medications before your next appointment, please call your pharmacy*  Lab Work: FASTING LP/CMET IN 2 MONTHS   If you have labs (blood work) drawn today and your tests are completely normal, you will receive your results only by: [name] MyChart Message (if you have MyChart) OR . A paper copy in the mail If you have any lab test that is abnormal or we need to change your treatment, we will call you to review the results.  Testing/Procedures: NONE  Follow-Up: At [facility], you and your health needs are our priority.  As part of our continuing mission to provide you with exceptional heart care, we have created designated Provider Care Teams.  These Care Teams include your primary Cardiologist (physician) and Advanced Practice Providers (APPs -  Physician Assistants and Nurse Practitioners) who all work together to provide you with the care you need, when you need it.  We recommend signing up for the patient portal called "MyChart".  Sign up information is provided on this After Visit Summary.  MyChart is used to connect with patients for Virtual Visits (Telemedicine).  Patients are able to view lab/test results, encounter notes, upcoming appointments, etc.  Non-urgent messages can be sent to your provider as well.   To learn more about what you can do with MyChart, go to [email or web address].    Your next appointment:   6 month(s)  You will receive a reminder letter in the mail two months in advance. If you don't receive a letter, please call our office to schedule the follow-up appointment.  The format for your next appointment:   In Person  Provider:   You may see Skeet Latch, MD or one of the following Advanced Practice Providers on your designated Care Team:    Kerin Ransom, PA-C  [place], [place]  Coletta Memos, [place]  You  have been referred to [place] 07/07/2020 AT 10:00 AM  [phone]

## 2020-07-02 NOTE — Progress Notes (Signed)
Cardiology Office Note   Date:  07/02/2020   ID:  [name] Brown, DOB 04-08-1951, MRN [id number]  PCP:  [name] Minium, MD  Cardiologist:  [name] Latch, MD  Electrophysiologist:  None   Evaluation Performed:  Follow-Up Visit  Chief Complaint:  Follow up  History of Present Illness:    [name] Brown is a 69 y.o. male with non-obstructive CAD, chronic systolic and diastolic heart failure (LVEF 30-35% improved to 60-65%%), aortic stenosis s/p bioprosthetic aortic valve,  and colon cancer s/p R colectomy who presents for follow up.  Mr. Bromwell saw Dr. Annye Brown on 09/23/16 and was noted to have coronary atherosclerosis on chest xray.  He had an echo 10/12/16 that revealed LVEF 30-35% with grade 1 diastolic dysfunction.  His aortic valve was heavily calcified and likely bicuspid.  Although the mean gradient was 14 mmHg, it was felt that he likely had moderate aortic stenosis given his reduced systolic function and leaflet restriction.   He was started on aspirin and referred to cardiology for evaluation.  He was referred for cardiac catheterization 10/2016 that revealed mild-mod CAD and LVEF 45-50%. [name] was referred for a repeat echo 01/17/17 that revealed LVEF 30-35% with grade 1 diastolic dysfunction. There was fusion of the right and noncoronary cusps and leaflet mobility was severely restricted. The mean gradient was 16 mmHg.    He had a dobutamine stress echo where his gradient increased to 38 mmHg with dobutamine infusion.  He underwent AVR with a 57mm Edwards Magna-Ease pericardial valve 06/08/17.  His hospitalization was complicated by atrial fibrillation and he was started on amiodarone which was discontinued 3 months post-op.  [name] had a repeat echocardiogram 03/2019 that revealed LVEF 60 to 65%.  The mean gradient across his aortic valve was 13 mmHg.  There has been concern by echo for an ascending aortic aneurysm up to 4.3 cm.  He was referred for CTA that revealed his  ascending aorta was 3.6 cm.  [name] was treated for colon cancer with surgery only and did not require chemotherapy.  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    Since his last appointment he has been doing well.  He has been working out five times per week. He does the elliptical and treadmill and lifts weight.  He feels well with exercise and has no exertional chest pain or shortness of breath.  He denies lower extremity edema, orthopnea, or PND.  He does note some mild lightheadedness at times.  At home his blood pressure has been very well-controlled.  This morning it was 116/68.  It averages in the 120s over 70s.  He was previously prescribed sildenafil but did not have any relief in his ED.   Past Medical History:  Diagnosis Date  . Aortic stenosis   . Ascending aortic aneurysm ([place]) 09/18/2018  . Colon cancer ([place])   . Family history of adverse reaction to anesthesia    pt states his mother had 2 episodes of hyponatremia following anesthesia   . GERD (gastroesophageal reflux disease)   . Headache   . Heart murmur   . Hyperlipidemia 10/25/2016  . Hypertension    pt is currently not taking any medications; pt states is borderline  . Melanoma ([place])   . OSA on CPAP 09/18/2018  . PAF (paroxysmal atrial fibrillation) ([place]) 06/13/2017  . Perforated sigmoid colon ([place])   . PVC's (premature ventricular contractions) 10/25/2016  . Tinnitus   .  Wears glasses    Past Surgical History:  Procedure Laterality Date  . AORTIC VALVE REPLACEMENT N/A 06/08/2017   Procedure: AORTIC VALVE REPLACEMENT (AVR);  Surgeon: [name] Pollack, MD;  Location: [facility];  Service: Open Heart Surgery;  Laterality: N/A;  Using 30mm [place]imount Magna Ease Pericardial Bioprosthesis Aortic Valve  . COLOSTOMY CLOSURE N/A 08/18/2016   Procedure: COLOSTOMY CLOSURE OPEN PROCEDURE;  Surgeon: [name] Gemma, MD;  Location: [facility];  Service: General;  Laterality: N/A;  . LAPAROTOMY N/A  04/25/2016   Procedure: EXPLORATORY LAPAROTOMY WITH SIGMOID COLECTOMY, COLOSTOMY;  Surgeon: [name] Gemma, MD;  Location: [facility];  Service: General;  Laterality: N/A;  . LEFT HEART CATH AND CORONARY ANGIOGRAPHY N/A 11/03/2016   Procedure: Left Heart Cath and Coronary Angiography;  Surgeon: [name] Blanks, MD;  Location: [facility];  Service: Cardiovascular;  Laterality: N/A;  . MELANOMA EXCISION    . PARTIAL COLECTOMY Right 08/18/2016   Procedure: RIGHT COLECTOMY;  Surgeon: [name] Gemma, MD;  Location: [facility];  Service: General;  Laterality: Right;  . TEE WITHOUT CARDIOVERSION N/A 06/08/2017   Procedure: TRANSESOPHAGEAL ECHOCARDIOGRAM (TEE);  Surgeon: [name] Pollack, MD;  Location: [facility];  Service: Open Heart Surgery;  Laterality: N/A;       Current Outpatient Medications:  .  aspirin EC 81 MG tablet, Take 81 mg by mouth daily., Disp: , Rfl:  .  cetirizine (ZYRTEC) 10 MG tablet, Take 10 mg by mouth daily as needed for allergies., Disp: , Rfl:  .  ezetimibe (ZETIA) 10 MG tablet, TAKE ONE TABLET BY MOUTH DAILY, Disp: 90 tablet, Rfl: 0 .  famotidine (PEPCID) 20 MG tablet, Take 20 mg by mouth daily as needed for heartburn or indigestion., Disp: , Rfl:  .  fluticasone (FLONASE) 50 MCG/ACT nasal spray, Place into both nostrils daily., Disp: , Rfl:  .  levothyroxine (SYNTHROID) 50 MCG tablet, Take 1 tablet (50 mcg total) by mouth daily., Disp: 90 tablet, Rfl: 0 .  losartan (COZAAR) 50 MG tablet, TAKE ONE TABLET BY MOUTH DAILY, Disp: 90 tablet, Rfl: 2 .  metoprolol succinate (TOPROL-XL) 25 MG 24 hr tablet, Take 25 mg by mouth daily., Disp: , Rfl:  .  rOPINIRole (REQUIP) 0.25 MG tablet, Take 3 tablets (0.75 mg total) by mouth at bedtime., Disp: 270 tablet, Rfl: 3 .  sildenafil (VIAGRA) 50 MG tablet, TAKE ONE TABLET BY MOUTH DAILY AS NEEDED, Disp: 10 tablet, Rfl: 1 .  pravastatin (PRAVACHOL) 20 MG tablet, Take 1 tablet (20 mg total) by mouth daily., Disp: 90 tablet, Rfl: 3  Allergies:    Patient has no known allergies.   Social History   Tobacco Use  . Smoking status: Never Smoker  . Smokeless tobacco: Never Used  Vaping Use  . Vaping Use: Never used  Substance Use Topics  . Alcohol use: Yes    Alcohol/week: 21.0 - 24.0 standard drinks    Types: 21 - 24 Standard drinks or equivalent per week    Comment: 2-3 glasses of wine daily   . Drug use: No     Family Hx: The patient's family history includes Alcohol abuse in his father; CAD in his mother; Cirrhosis in his father; Dementia in his mother; Heart attack in his father; Kidney disease in his mother; Stroke in his maternal grandmother and paternal grandmother; Vascular Disease in his mother.  ROS:   Please see the history of present illness.    All other systems reviewed and are negative.   Prior  CV studies:   The following studies were reviewed today:  Echo 03/2019: IMPRESSIONS   1. The left ventricle has normal systolic function with an ejection fraction of 60-65%. The cavity size was normal. Left ventricular diastolic Doppler parameters are consistent with impaired relaxation.  2. The right ventricle has normal systolic function. The cavity was normal. There is no increase in right ventricular wall thickness.  SUMMARY   When compared to the prior study from12/19/2019 there is no significant change. Transaortic gradients across the bioprosthetic valve remain within upper normal limit.  Echo 01/17/17: Study Conclusions  - Left ventricle: The cavity size was normal. Wall thickness was normal. Systolic function was moderately to severely reduced. The estimated ejection fraction was in the range of 30% to 35%. Moderate diffuse hypokinesis with no identifiable regional variations. Doppler parameters are consistent with abnormal left ventricular relaxation (grade 1 diastolic dysfunction). - Ventricular septum: Septal motion showed paradox. These changes are consistent with a left bundle branch  block. - Aortic valve: Possibly bicuspid; severely thickened, severely calcified leaflets; fusion of the right-noncoronary commissure. Anterior cusp mobility was severely restricted. Transvalvular velocity was increased, due to low cardiac output. There was moderate to severe stenosis. There was mild regurgitation. - Right ventricle: Systolic function was mildly reduced.  Impressions:  - Suspect low gradient severe aortic stenosis.  Recommendations: Consider dobutamine echo.  LHC 11/03/16:   Mid RCA to Dist RCA lesion, 10 %stenosed.  RPDA lesion, 10 %stenosed.  Ost Cx to Prox Cx lesion, 40 %stenosed.  Ost Ramus to Ramus lesion, 20 %stenosed.  Mid LAD lesion, 20 %stenosed.  Prox LAD lesion, 30 %stenosed.  The left ventricular ejection fraction is 45-50% by visual estimate.  There is mild left ventricular systolic dysfunction.  LV end diastolic pressure is normal.  There is no mitral valve regurgitation.  Labs/Other Tests and Data Reviewed:    EKG:  An ECG dated 07/02/2020 was personally reviewed today and demonstrated:  Sinus bradycardia.  Rate 53 bpm.  Prior anteroseptal infarct.  Recent Labs: 04/16/2020: ALT 47; BUN 14; Creatinine, Ser 0.92; Hemoglobin 15.4; Platelets 163.0; Potassium 5.1; Sodium 137; TSH 2.26   Recent Lipid Panel Lab Results  Component Value Date/Time   CHOL 158 04/16/2020 01:36 PM   CHOL 157 02/22/2018 09:11 AM   TRIG 98.0 04/16/2020 01:36 PM   HDL 55.10 04/16/2020 01:36 PM   HDL 60 02/22/2018 09:11 AM   CHOLHDL 3 04/16/2020 01:36 PM   LDLCALC 84 04/16/2020 01:36 PM   LDLCALC 75 02/22/2018 09:11 AM   LDLDIRECT 92.0 10/09/2019 11:17 AM    Wt Readings from Last 3 Encounters:  07/02/20 168 lb (76.2 kg)  04/16/20 172 lb (78 kg)  10/09/19 179 lb 6.4 oz (81.4 kg)     Objective:    VS:  BP 130/80   Pulse (!) 53   Ht 5\' 6"  (1.676 m)   Wt 168 lb (76.2 kg)   SpO2 99%   BMI 27.12 kg/m  , BMI Body mass index is 27.12  kg/m. GENERAL:  Well appearing HEENT: Pupils equal round and reactive, fundi not visualized, oral mucosa unremarkable NECK:  No jugular venous distention, waveform within normal limits, carotid upstroke brisk and symmetric, no bruits LUNGS:  Clear to auscultation bilaterally HEART:  RRR.  PMI not displaced or sustained,S1 and S2 within normal limits, no S3, no S4, no clicks, no rubs, III/VI systolic murmur ABD:  Flat, positive bowel sounds normal in frequency in pitch, no bruits, no rebound, no guarding,  no midline pulsatile mass, no hepatomegaly, no splenomegaly EXT:  2 plus pulses throughout, no edema, no cyanosis no clubbing SKIN:  No rashes no nodules NEURO:  Cranial nerves II through XII grossly intact, motor grossly intact throughout PSYCH:  Cognitively intact, oriented to person place and time   ASSESSMENT & PLAN:    # Chronic systolic and diastolic heart failure:   LVEF has increased to 60 to 65% status post aortic valve replacement.  He is doing well and remains euvolemic.  # Rate related LBBB:  Mr. Holston developed sinus tachycardia with exercise and a rate related bundle branch block.  No further investigation needed.  He has no significant CAD as noted on left heart catheterization prior to/2018.  # Non-obstructive CAD:  # Hyperlipidemia:  He had elevated LFTs with rosuvastatin.    Lipids are slightly above goal.  We will increase pravastatin to 20 mg and continue Zetia.  He will repeat lipids and a CMP in 2 months.  # Mild-moderate AS:   # Bicuspid aortic valve s/p bioprosthetic AVR: Mr. Kennerly is doing well after aortic valve replacement.  Gradients were mildly elevated on his postoperative echo.  Mean gradient was 13 mmHg on his most recent echo.  He is doing well.  He needs antibiotic prophylaxis for dental procedures.  # Hypertension:  Blood pressure well-controlled on losartan and metoprolol.  Given his bradycardia and dizziness will reduce metoprolol to 25 mg  daily.  # PVCs:  Stable on metoprolol.  # Ascending aorta aneurysm: 3.7 cm on CT 04/2019.  We will get an aorta MRA next year.  COVID-19 Education: The signs and symptoms of COVID-19 were discussed with the patient and how to seek care for testing (follow up with PCP or arrange E-visit).  The importance of social distancing was discussed today.    Medication Adjustments/Labs and Tests Ordered: Current medicines are reviewed at length with the patient today.  Concerns regarding medicines are outlined above.   Tests Ordered: Orders Placed This Encounter  Procedures  . Lipid panel  . Comprehensive metabolic panel  . EKG 12-Lead    Medication Changes: Meds ordered this encounter  Medications  . DISCONTD: pravastatin (PRAVACHOL) 20 MG tablet    Sig: Take 1 tablet (20 mg total) by mouth daily for 1 day.    Dispense:  90 tablet    Refill:  3    NEW DOSE, D/C 10 MG RX  . pravastatin (PRAVACHOL) 20 MG tablet    Sig: Take 1 tablet (20 mg total) by mouth daily.    Dispense:  90 tablet    Refill:  3    NEW DOSE, D/C 10 MG RX & PREVIOUS RX    Follow Up: in 6 months  Signed, [name] Latch, MD  07/02/2020 2:43 PM    [place]

## 2020-07-04 DIAGNOSIS — Z23 Encounter for immunization: Secondary | ICD-10-CM | POA: Diagnosis not present

## 2020-07-06 DIAGNOSIS — Z23 Encounter for immunization: Secondary | ICD-10-CM | POA: Diagnosis not present

## 2020-08-02 ENCOUNTER — Other Ambulatory Visit: Payer: Self-pay | Admitting: Cardiovascular Disease

## 2020-08-11 DIAGNOSIS — D2271 Melanocytic nevi of right lower limb, including hip: Secondary | ICD-10-CM | POA: Diagnosis not present

## 2020-08-11 DIAGNOSIS — Z85820 Personal history of malignant melanoma of skin: Secondary | ICD-10-CM | POA: Diagnosis not present

## 2020-08-11 DIAGNOSIS — D2272 Melanocytic nevi of left lower limb, including hip: Secondary | ICD-10-CM | POA: Diagnosis not present

## 2020-08-11 DIAGNOSIS — L821 Other seborrheic keratosis: Secondary | ICD-10-CM | POA: Diagnosis not present

## 2020-08-11 DIAGNOSIS — D225 Melanocytic nevi of trunk: Secondary | ICD-10-CM | POA: Diagnosis not present

## 2020-08-11 DIAGNOSIS — N5201 Erectile dysfunction due to arterial insufficiency: Secondary | ICD-10-CM | POA: Diagnosis not present

## 2020-08-11 DIAGNOSIS — L905 Scar conditions and fibrosis of skin: Secondary | ICD-10-CM | POA: Diagnosis not present

## 2020-08-31 ENCOUNTER — Other Ambulatory Visit: Payer: Self-pay | Admitting: Cardiovascular Disease

## 2020-09-01 ENCOUNTER — Other Ambulatory Visit: Payer: Self-pay

## 2020-09-01 DIAGNOSIS — Z5181 Encounter for therapeutic drug level monitoring: Secondary | ICD-10-CM | POA: Diagnosis not present

## 2020-09-01 DIAGNOSIS — E7800 Pure hypercholesterolemia, unspecified: Secondary | ICD-10-CM | POA: Diagnosis not present

## 2020-09-01 DIAGNOSIS — I10 Essential (primary) hypertension: Secondary | ICD-10-CM | POA: Diagnosis not present

## 2020-09-01 LAB — COMPREHENSIVE METABOLIC PANEL
ALT: 32 IU/L (ref 0–44)
AST: 26 IU/L (ref 0–40)
Albumin/Globulin Ratio: 2.4 — ABNORMAL HIGH (ref 1.2–2.2)
Albumin: 4.6 g/dL (ref 3.8–4.8)
Alkaline Phosphatase: 73 IU/L (ref 44–121)
BUN/Creatinine Ratio: 11 (ref 10–24)
BUN: 12 mg/dL (ref 8–27)
Bilirubin Total: 0.6 mg/dL (ref 0.0–1.2)
CO2: 27 mmol/L (ref 20–29)
Calcium: 9.5 mg/dL (ref 8.6–10.2)
Chloride: 101 mmol/L (ref 96–106)
Creatinine, Ser: 1.12 mg/dL (ref 0.76–1.27)
GFR calc Af Amer: 77 mL/min/{1.73_m2} (ref >59)
GFR calc non Af Amer: 66 mL/min/{1.73_m2} (ref >59)
Globulin, Total: 1.9 g/dL (ref 1.5–4.5)
Glucose: 95 mg/dL (ref 65–99)
Potassium: 5.3 mmol/L — ABNORMAL HIGH (ref 3.5–5.2)
Sodium: 140 mmol/L (ref 134–144)
Total Protein: 6.5 g/dL (ref 6.0–8.5)

## 2020-09-01 LAB — LIPID PANEL
Chol/HDL Ratio: 2.9 ratio (ref 0.0–5.0)
Cholesterol, Total: 166 mg/dL (ref 100–199)
HDL: 58 mg/dL (ref >39)
LDL Chol Calc (NIH): 92 mg/dL (ref 0–99)
Triglycerides: 88 mg/dL (ref 0–149)
VLDL Cholesterol Cal: 16 mg/dL (ref 5–40)

## 2020-09-01 MED ORDER — LEVOTHYROXINE SODIUM 50 MCG PO TABS
50.0000 ug | ORAL_TABLET | Freq: Every day | ORAL | 0 refills | Status: DC
Start: 2020-09-01 — End: 2020-11-17

## 2020-09-01 MED ORDER — METOPROLOL SUCCINATE ER 25 MG PO TB24
25.0000 mg | ORAL_TABLET | Freq: Every day | ORAL | 3 refills | Status: DC
Start: 2020-09-01 — End: 2020-11-24

## 2020-09-22 ENCOUNTER — Other Ambulatory Visit: Payer: Self-pay

## 2020-09-22 ENCOUNTER — Ambulatory Visit (INDEPENDENT_AMBULATORY_CARE_PROVIDER_SITE_OTHER): Payer: Medicare Other | Admitting: Neurology

## 2020-09-22 ENCOUNTER — Encounter: Payer: Self-pay | Admitting: Neurology

## 2020-09-22 VITALS — BP 150/84 | HR 70 | Ht 66.0 in | Wt 173.0 lb

## 2020-09-22 DIAGNOSIS — Z9989 Dependence on other enabling machines and devices: Secondary | ICD-10-CM | POA: Diagnosis not present

## 2020-09-22 DIAGNOSIS — G4761 Periodic limb movement disorder: Secondary | ICD-10-CM | POA: Diagnosis not present

## 2020-09-22 DIAGNOSIS — G4733 Obstructive sleep apnea (adult) (pediatric): Secondary | ICD-10-CM

## 2020-09-22 DIAGNOSIS — G2581 Restless legs syndrome: Secondary | ICD-10-CM

## 2020-09-22 MED ORDER — ROPINIROLE HCL 0.25 MG PO TABS: 0.7500 mg | ORAL_TABLET | Freq: Every day | ORAL | 3 refills | Status: DC

## 2020-09-22 NOTE — Patient Instructions (Addendum)
Please continue using your CPAP regularly. While your insurance requires that you use CPAP at least 4 hours each night on 70% of the nights, I recommend, that you not skip any nights and use it throughout the night if you can. Getting used to CPAP and staying with the treatment long term does take time and patience and discipline. Untreated obstructive sleep apnea when it is moderate to severe can have an adverse impact on cardiovascular health and raise her risk for heart disease, arrhythmias, hypertension, congestive heart failure, stroke and diabetes. Untreated obstructive sleep apnea causes sleep disruption, nonrestorative sleep, and sleep deprivation. This can have an impact on your day to day functioning and cause daytime sleepiness and impairment of cognitive function, memory loss, mood disturbance, and problems focussing. Using CPAP regularly can improve these symptoms.  I have renewed your ropinirole prescription, we will keep the dose at 0.25 mg strength, take 3 pills around 9 PM nightly.  Keep up the good work! We can see you in 1 year, you can see one of our nurse practitioners as you are stable.

## 2020-09-22 NOTE — Progress Notes (Signed)
Subjective:    Patient ID: [name] Brown is a 70 y.o. male.  HPI     Interim history:   [name] as a 70 year old right-handed gentleman with an underlying medical history of aortic stenosis with heart murmur, hypertension, hyperlipidemia, history of melanoma, history of paroxysmal A. fib, reflux disease, history of perforated sigmoid colon, history of PVCs, tinnitus, and mildly overweight state, who presents for follow-up consultation of his RLS and OSA, on treatment with CPAP therapy. The patient is unaccompanied today and presents for his yearly check up.  I last saw him on 09/18/2019, at which time he was fully compliant with his CPAP.  He was able to tolerate Requip.  Requip was helpful for his restless leg symptoms.  He was doing well with regards to his sleep apnea treatment with CPAP.  He was advised to follow-up routinely in 1 year.  He was advised to continue with ropinirole 0.75 mg each night.  Today, 09/22/2020: I reviewed his CPAP compliance data from the past 30 days from 08/22/2020 through 09/20/2020, during which time he used his machine every night with percent use days greater than 4 hours at 100%, indicating superb compliance with an average usage of 8 hours and 3 minutes, residual AHI at goal at 2.1/h, leak on the low side with a 95th percentile at 1.2 L/min on a pressure of 9 cm without EPR.  He reports doing well with his CPAP.  He is using a small mask, the medium was too large for him.  He is very consistent with the usage and does not mind using CPAP long-term.  Ropinirole continues to help with his leg twitching and he takes it every night around 9 PM, he is in bed generally between 1030 and 11 PM.  He has noticed no side effects with the ropinirole, in particular, no significant swelling in the lower extremities, no sleepiness, no abnormal behaviors noted.  He sees his cardiologist twice a year and primary care also twice a year.  He had an increase in his pravastatin.  The  patient's allergies, current medications, family history, past medical history, past social history, past surgical history and problem list were reviewed and updated as appropriate.    Previously:    I saw him on 04/30/2019, at which time he was still having intermittent dizzy spells, he was no longer on trazodone.  He was compliant with CPAP, still had some daytime somnolence.  Since he had significant PLM's on his sleep study and had some restless leg type symptoms I suggested we start him on Requip with gradual titration.   I reviewed his CPAP compliance data from 08/18/2019 through 09/16/2019, which is a total of 30 days, during which time he used his machine every night with percent use days greater than 4 hours at 100%, indicating superb compliance with an average usage of 8 hours and 12 minutes, residual AHI at goal at 2/h, leak low with a 95th percentile at 1.1 L/min on a pressure of 9 cm.     He was seen by Cecille Rubin, nurse practitioner on 10/30/2018, at which time he was compliant with his CPAP.  A brain MRI in the interim in October 2019 showed no acute findings.  His baseline sleep study from 07/02/2018 showed an AHI of 9/h, REM AHI of 16.7/h, O2 nadir of 73%.   I reviewed his CPAP compliance data from 03/30/2019 through 04/28/2019 which is a total of 30 days, during which time he used his CPAP every  night with percent use days greater than 4 hours at 100%, indicating superb compliance with an average usage of 8 hours and 4 minutes, residual AHI at goal at 1.5/h, leak low with a 95th percentile at 0 L/min on a pressure of 9 cm.     06/06/2018: (He) reports a several month history of a constant sense of dizziness, no actual vertigo reported, no recent falls thankfully. He has had multiple surgeries including melanoma excision, partial colectomy, colostomy closure, TEE wo cardioversion, and aortic valve replacement. He is a retired Software engineer, nonsmoker but drinks alcohol on a regular basis, about  21 to 24 drinks per week on average. He drinks caffeine in the form of coffee, 2 cups per day on average. He is working on reducing his alcohol intake. He wants to lose some weight and is striving for a BMI of 25 unless. Of note, he does not always sleep well, has sleep disruption, snores, wakes up to go to the bathroom about 2 or 3 times on an average night. He denies morning headaches. His dizziness started a few months ago and was gradual. A few years ago he had very rare symptoms and now he seems to have more constant symptoms. He denies any spinning sensation. He has a long-standing history of tinnitus and mild hearing loss. He has not seen ENT recently. Bedtime is between 11 and 11:30 PM, rise time is generally between 7 and 8. His Epworth sleepiness score is 13 out of 24, fatigue score is 26 out of 63. His Past Medical History Is Significant For: Past Medical History:  Diagnosis Date  . Aortic stenosis   . Ascending aortic aneurysm ([place]) 09/18/2018  . Colon cancer ([place])   . Family history of adverse reaction to anesthesia    pt states his mother had 2 episodes of hyponatremia following anesthesia   . GERD (gastroesophageal reflux disease)   . Headache   . Heart murmur   . Hyperlipidemia 10/25/2016  . Hypertension    pt is currently not taking any medications; pt states is borderline  . Melanoma ([place])   . OSA on CPAP 09/18/2018  . PAF (paroxysmal atrial fibrillation) ([place]) 06/13/2017  . Perforated sigmoid colon ([place])   . PVC's (premature ventricular contractions) 10/25/2016  . Tinnitus   . Wears glasses     His Past Surgical History Is Significant For: Past Surgical History:  Procedure Laterality Date  . AORTIC VALVE REPLACEMENT N/A 06/08/2017   Procedure: AORTIC VALVE REPLACEMENT (AVR);  Surgeon: [name], MD;  Location: [facility];  Service: Open Heart Surgery;  Laterality: N/A;  Using 79mm Perimount Magna Ease Pericardial Bioprosthesis Aortic Valve  . COLOSTOMY CLOSURE N/A 08/18/2016    Procedure: COLOSTOMY CLOSURE OPEN PROCEDURE;  Surgeon: Armandina Gemma, MD;  Location: [facility];  Service: General;  Laterality: N/A;  . LAPAROTOMY N/A 04/25/2016   Procedure: EXPLORATORY LAPAROTOMY WITH SIGMOID COLECTOMY, COLOSTOMY;  Surgeon: Armandina Gemma, MD;  Location: [facility];  Service: General;  Laterality: N/A;  . LEFT HEART CATH AND CORONARY ANGIOGRAPHY N/A 11/03/2016   Procedure: Left Heart Cath and Coronary Angiography;  Surgeon: Burnell Blanks, MD;  Location: [facility];  Service: Cardiovascular;  Laterality: N/A;  . MELANOMA EXCISION    . PARTIAL COLECTOMY Right 08/18/2016   Procedure: RIGHT COLECTOMY;  Surgeon: Armandina Gemma, MD;  Location: [facility];  Service: General;  Laterality: Right;  . TEE WITHOUT CARDIOVERSION N/A 06/08/2017   Procedure: TRANSESOPHAGEAL ECHOCARDIOGRAM (TEE);  Surgeon: [name],  MD;  Location: [facility];  Service: Open Heart Surgery;  Laterality: N/A;    His Family History Is Significant For: Family History  Problem Relation Age of Onset  . Dementia Mother   . Kidney disease Mother   . Vascular Disease Mother   . CAD Mother   . Heart attack Father   . Alcohol abuse Father   . Cirrhosis Father   . Stroke Maternal Grandmother   . Stroke Paternal Grandmother     His Social History Is Significant For: Social History   Socioeconomic History  . Marital status: Married    Spouse name: Not on file  . Number of children: Not on file  . Years of education: Not on file  . Highest education level: Not on file  Occupational History  . Occupation: Retired Software engineer  Tobacco Use  . Smoking status: Never Smoker  . Smokeless tobacco: Never Used  Vaping Use  . Vaping Use: Never used  Substance and Sexual Activity  . Alcohol use: Yes    Alcohol/week: 21.0 - 24.0 standard drinks    Types: 21 - 24 Standard drinks or equivalent per week    Comment: 2-3 glasses of wine daily   . Drug use: No  . Sexual activity: Not on file  Other Topics Concern  .  Not on file  Social History Narrative   Pt lives in [place] with his wife, they are active.      Pt lives at home with his wife.      1 Cat    Social Determinants of Health   Financial Resource Strain: Not on file  Food Insecurity: Not on file  Transportation Needs: Not on file  Physical Activity: Not on file  Stress: Not on file  Social Connections: Not on file    His Allergies Are:  No Known Allergies:   His Current Medications Are:  Outpatient Encounter Medications as of 09/22/2020  Medication Sig  . aspirin EC 81 MG tablet Take 81 mg by mouth daily.  . cetirizine (ZYRTEC) 10 MG tablet Take 10 mg by mouth daily as needed for allergies.  [name] ezetimibe (ZETIA) 10 MG tablet TAKE ONE TABLET BY MOUTH DAILY  . famotidine (PEPCID) 20 MG tablet Take 20 mg by mouth daily as needed for heartburn or indigestion.  . fluticasone (FLONASE) 50 MCG/ACT nasal spray Place into both nostrils daily.  [name] levothyroxine (SYNTHROID) 50 MCG tablet Take 1 tablet (50 mcg total) by mouth daily.  [name] losartan (COZAAR) 50 MG tablet TAKE ONE TABLET BY MOUTH DAILY  . metoprolol succinate (TOPROL-XL) 25 MG 24 hr tablet Take 1 tablet (25 mg total) by mouth daily.  . pravastatin (PRAVACHOL) 20 MG tablet Take 1 tablet (20 mg total) by mouth daily.  [name] rOPINIRole (REQUIP) 0.25 MG tablet Take 3 tablets (0.75 mg total) by mouth at bedtime.  . tadalafil (CIALIS) 20 MG tablet Take 20 mg by mouth daily as needed for erectile dysfunction.  . [DISCONTINUED] pravastatin (PRAVACHOL) 10 MG tablet TAKE ONE TABLET BY MOUTH DAILY  . [DISCONTINUED] sildenafil (VIAGRA) 50 MG tablet TAKE ONE TABLET BY MOUTH DAILY AS NEEDED   No facility-administered encounter medications on file as of 09/22/2020.  :  Review of Systems:  Out of a complete 14 point review of systems, all are reviewed and negative with the exception of these symptoms as listed below: Review of Systems  Neurological:       Patient presents today to discuss his  CPAP.  Patient  reports that his CPAP is going well.    Objective:  Neurological Exam  Physical Exam Physical Examination:   Vitals:   09/22/20 1043  BP: (!) 150/84  Pulse: 70    General Examination: The patient is a very pleasant 70 y.o. male in no acute distress. He appears well-developed and well-nourished and well groomed.   HEENT:Normocephalic, atraumatic, pupils are equal, round and reactive to light. Corrective eyeglasses in place.Extraocular tracking is well-preserved, face is symmetric with normal facial animation, speech is clear without dysarthria or hypophonia or voice tremor.  No carotid bruits.  Airway examination reveals mild to moderate mouth dryness, otherwise stable findings, tongue protrudes centrally and palate elevates symmetrically.    Chest:Clear to auscultation without wheezing, rhonchi or crackles noted.  [id number] systolic murmur noted.  Abdomen:Soft, non-tender and non-distended with normal bowel sounds appreciated on auscultation.  Extremities:There isnopitting edema in the distal lower extremities bilaterally.   Skin: Warm and dry without trophic changes noted.  Musculoskeletal: exam reveals no obvious joint deformities, tenderness or joint swelling or erythema.   Neurologically:  Mental status: The patient is awake, alert and oriented in all 4 spheres.Hisimmediate and remote memory, attention, language skills and fund of knowledge are appropriate. There is no evidence of aphasia, agnosia, apraxia or anomia. Speech is clear with normal prosody and enunciation. Thought process is linear. Mood is normaland affect is normal.  Cranial nerves II - XII are as described above under HEENT exam.  Motor exam: Normal bulk, strength and tone is noted. There is no tremor. Fine motor skills and coordination:grosslyintact.  Cerebellar testing: No dysmetria or intention tremor. There is no truncal or gait ataxia.  Sensory exam: intact to  light touch in the upper and lower extremities. Romberg neg.  Gait, station and balance:Hestands easily. No veering to one side is noted. No leaning to one side is noted. Posture is age-appropriate and stance is narrow based. Gait showsnormalstride length and normalpace. No problems turning are noted. Tandem gait good.  Assessmentand Plan:   In summary,Mehki Breezeis a very pleasant 41 year oldmalewith an underlying medical history of aortic stenosis with heart murmur, hypertension, hyperlipidemia, history of melanoma, history of paroxysmal A. fib, reflux disease, history of perforated sigmoid colon, history of PVCs, tinnitus, and mildly overweight state, whopresents forfollow-up consultation of his sleep apnea, RLS and PLMD.  He is on ropinirole 0.75 mg strength at bedtime which has been helpful for his PLM's and restless leg symptoms.  He had a sleep study in October 2019 which showed overall mild OSA, but O2 nadir of 73%, moderate REM related sleep apnea. He had significant PLMs during the baseline sleep study with minimal to mild arousals. He has been established on CPAP therapy with full compliance. Dizziness has improved. MRI brain in October 2019 showed no significant findings. Head CT showed chronic sinus related changes. He started Requip in August 2020 and titrated up to 3 pills a night for a total dose of 0.75 mg each evening. He is tolerating this well and overall improved with regards to his sleep consolidation, sleep quality, RLS symptoms and daytime energy.   He is commended for his full treatment adherence with CPAP and we mutually agreed to keep his Requip at the current dose.  I renewed his prescription for 90 days with refills.  He is advised to follow-up routinely in 1 year to see one of our nurse practitioners.  I answered all his questions today and he was in agreement.   I spent 20  minutes in total face-to-face time and in reviewing records during pre-charting, more  than 50% of which was spent in counseling and coordination of care, reviewing test results, reviewing medications and treatment regimen and/or in discussing or reviewing the diagnosis of OSA, RLS, the prognosis and treatment options. Pertinent laboratory and imaging test results that were available during this visit with the patient were reviewed by me and considered in my medical decision making (see chart for details).

## 2020-10-12 ENCOUNTER — Ambulatory Visit (INDEPENDENT_AMBULATORY_CARE_PROVIDER_SITE_OTHER): Payer: Medicare Other

## 2020-10-12 ENCOUNTER — Other Ambulatory Visit: Payer: Self-pay

## 2020-10-12 VITALS — BP 126/78 | HR 75 | Temp 98.5°F | Resp 16 | Ht 66.0 in | Wt 174.0 lb

## 2020-10-12 DIAGNOSIS — Z0000 Encounter for general adult medical examination without abnormal findings: Secondary | ICD-10-CM

## 2020-10-12 NOTE — Progress Notes (Signed)
Subjective:   [name] Brown is a 70 y.o. male who presents for Medicare Annual/Subsequent preventive examination.  Review of Systems     Cardiac Risk Factors include: advanced age (>32men, >72 women);dyslipidemia;hypertension;male gender     Objective:    Today's Vitals   10/12/20 1228  BP: 126/78  Pulse: 75  Resp: 16  Temp: 98.5 F (36.9 C)  TempSrc: Oral  SpO2: 98%  Weight: 174 lb (78.9 kg)  Height: 5\' 6"  (1.676 m)   Body mass index is 28.08 kg/m.  Advanced Directives 10/12/2020 10/09/2019 12/23/2018 03/28/2018 07/25/2017 06/10/2017 06/06/2017  Does Patient Have a Medical Advance Directive? Yes Yes No Yes Yes Yes Yes  Type of Estate agent of [place];Living will Living will;Healthcare Power of [address] - Healthcare Power of [place];Living will - Healthcare Power of [place];Living will Healthcare Power of [place];Living will  Does patient want to make changes to medical advance directive? - No - Patient declined - - - No - Patient declined -  Copy of Healthcare Power of Attorney in Chart? Yes - validated most recent copy scanned in chart (See row information) Yes - validated most recent copy scanned in chart (See row information) - Yes - No - copy requested No - copy requested  Would patient like information on creating a medical advance directive? - - No - Patient declined - - - -    Current Medications (verified) Outpatient Encounter Medications as of 10/12/2020  Medication Sig  . aspirin EC 81 MG tablet Take 81 mg by mouth daily.  . cetirizine (ZYRTEC) 10 MG tablet Take 10 mg by mouth daily as needed for allergies.  [name] ezetimibe (ZETIA) 10 MG tablet TAKE ONE TABLET BY MOUTH DAILY  . famotidine (PEPCID) 20 MG tablet Take 20 mg by mouth daily as needed for heartburn or indigestion.  . fluticasone (FLONASE) 50 MCG/ACT nasal spray Place into both nostrils daily.  [name] levothyroxine (SYNTHROID) 50 MCG tablet Take 1 tablet (50 mcg total) by mouth daily.  [name]  losartan (COZAAR) 50 MG tablet TAKE ONE TABLET BY MOUTH DAILY  . metoprolol succinate (TOPROL-XL) 25 MG 24 hr tablet Take 1 tablet (25 mg total) by mouth daily.  . pravastatin (PRAVACHOL) 20 MG tablet Take 1 tablet (20 mg total) by mouth daily.  [name] rOPINIRole (REQUIP) 0.25 MG tablet Take 3 tablets (0.75 mg total) by mouth at bedtime.  . tadalafil (CIALIS) 20 MG tablet Take 20 mg by mouth daily as needed for erectile dysfunction.   No facility-administered encounter medications on file as of 10/12/2020.    Allergies (verified) Patient has no known allergies.   History: Past Medical History:  Diagnosis Date  . Aortic stenosis   . Ascending aortic aneurysm (HCC) 09/18/2018  . Colon cancer (HCC)   . Family history of adverse reaction to anesthesia    pt states his mother had 2 episodes of hyponatremia following anesthesia   . GERD (gastroesophageal reflux disease)   . Headache   . Heart murmur   . Hyperlipidemia 10/25/2016  . Hypertension    pt is currently not taking any medications; pt states is borderline  . Melanoma (HCC)   . OSA on CPAP 09/18/2018  . PAF (paroxysmal atrial fibrillation) (HCC) 06/13/2017  . Perforated sigmoid colon (HCC)   . PVC's (premature ventricular contractions) 10/25/2016  . Tinnitus   . Wears glasses    Past Surgical History:  Procedure Laterality Date  . AORTIC VALVE REPLACEMENT N/A 06/08/2017   Procedure: AORTIC VALVE REPLACEMENT (  AVR);  Surgeon: [name], MD;  Location: [facility] OR;  Service: Open Heart Surgery;  Laterality: N/A;  Using 83mm Perimount Magna Ease Pericardial Bioprosthesis Aortic Valve  . COLOSTOMY CLOSURE N/A 08/18/2016   Procedure: COLOSTOMY CLOSURE OPEN PROCEDURE;  Surgeon: Armandina Gemma, MD;  Location: [facility] ORS;  Service: General;  Laterality: N/A;  . LAPAROTOMY N/A 04/25/2016   Procedure: EXPLORATORY LAPAROTOMY WITH SIGMOID COLECTOMY, COLOSTOMY;  Surgeon: Armandina Gemma, MD;  Location: [facility] ORS;  Service: General;  Laterality: N/A;  . LEFT HEART  CATH AND CORONARY ANGIOGRAPHY N/A 11/03/2016   Procedure: Left Heart Cath and Coronary Angiography;  Surgeon: Burnell Blanks, MD;  Location: [facility];  Service: Cardiovascular;  Laterality: N/A;  . MELANOMA EXCISION    . PARTIAL COLECTOMY Right 08/18/2016   Procedure: RIGHT COLECTOMY;  Surgeon: Armandina Gemma, MD;  Location: [facility] ORS;  Service: General;  Laterality: Right;  . TEE WITHOUT CARDIOVERSION N/A 06/08/2017   Procedure: TRANSESOPHAGEAL ECHOCARDIOGRAM (TEE);  Surgeon: [name], MD;  Location: [place];  Service: Open Heart Surgery;  Laterality: N/A;   Family History  Problem Relation Age of Onset  . Dementia Mother   . Kidney disease Mother   . Vascular Disease Mother   . CAD Mother   . Heart attack Father   . Alcohol abuse Father   . Cirrhosis Father   . Stroke Maternal Grandmother   . Stroke Paternal Grandmother    Social History   Socioeconomic History  . Marital status: Married    Spouse name: Not on file  . Number of children: Not on file  . Years of education: Not on file  . Highest education level: Not on file  Occupational History  . Occupation: Retired Software engineer  Tobacco Use  . Smoking status: Never Smoker  . Smokeless tobacco: Never Used  Vaping Use  . Vaping Use: Never used  Substance and Sexual Activity  . Alcohol use: Yes    Alcohol/week: 21.0 - 24.0 standard drinks    Types: 21 - 24 Standard drinks or equivalent per week    Comment: 2-3 glasses of wine daily   . Drug use: No  . Sexual activity: Not on file  Other Topics Concern  . Not on file  Social History Narrative   Pt lives in [place] with his wife, they are active.      Pt lives at home with his wife.      1 Cat    Social Determinants of Health   Financial Resource Strain: Low Risk   . Difficulty of Paying Living Expenses: Not hard at all  Food Insecurity: No Food Insecurity  . Worried About Charity fundraiser in the Last Year: Never true  . Ran Out of Food in  the Last Year: Never true  Transportation Needs: No Transportation Needs  . Lack of Transportation (Medical): No  . Lack of Transportation (Non-Medical): No  Physical Activity: Sufficiently Active  . Days of Exercise per Week: 5 days  . Minutes of Exercise per Session: 40 min  Stress: No Stress Concern Present  . Feeling of Stress : Not at all  Social Connections: Moderately Isolated  . Frequency of Communication with Friends and Family: More than three times a week  . Frequency of Social Gatherings with Friends and Family: More than three times a week  . Attends Religious Services: Never  . Active Member of Clubs or Organizations: No  . Attends Archivist Meetings: Never  .  Marital Status: Married    Tobacco Counseling Counseling given: Not Answered   Clinical Intake:  Pre-visit preparation completed: Yes  Pain : No/denies pain     Nutritional Status: BMI 25 -29 Overweight Nutritional Risks: None Diabetes: No  How often do you need to have someone help you when you read instructions, pamphlets, or other written materials from your doctor or pharmacy?: 1 - Never  Diabetic?No  Interpreter Needed?: No  Information entered by :: Caroleen Hamman LPN   Activities of Daily Living In your present state of health, do you have any difficulty performing the following activities: 10/12/2020 04/16/2020  Hearing? N N  Comment mild -  Vision? N N  Difficulty concentrating or making decisions? N N  Walking or climbing stairs? N N  Dressing or bathing? N N  Doing errands, shopping? N N  Preparing Food and eating ? N -  Using the Toilet? N -  In the past six months, have you accidently leaked urine? N -  Do you have problems with loss of bowel control? N -  Managing your Medications? N -  Managing your Finances? N -  Housekeeping or managing your Housekeeping? N -  Some recent data might be hidden    Patient Care Team: [name], MD as PCP - General  (Family Medicine) Skeet Latch, MD as PCP - Cardiology (Cardiology) Skeet Latch, MD as Attending Physician (Cardiology) Ladell Pier, MD as Consulting Physician (Oncology) Clarene Essex, MD as Consulting Physician (Gastroenterology) [name], MD as Consulting Physician (Dermatology) Armandina Gemma, MD as Consulting Physician (General Surgery) Star Age, MD as Consulting Physician (Neurology) Jola Schmidt, MD as Consulting Physician (Ophthalmology)  Indicate any recent Medical Services you may have received from other than [facility] providers in the past year (date may be approximate).     Assessment:   This is a routine wellness examination for Zyron.  Hearing/Vision screen  Hearing Screening   125Hz  250Hz  500Hz  1000Hz  2000Hz  3000Hz  4000Hz  6000Hz  8000Hz   Right ear:           Left ear:           Comments: Mild hearing loss  Vision Screening Comments: Wears glasses Last eye exam-08/2020-Dr. Bowen  Dietary issues and exercise activities discussed: Current Exercise Habits: Home exercise routine, Type of exercise: strength training/weights;Other - see comments (cardio), Time (Minutes): 45, Frequency (Times/Week): 5, Weekly Exercise (Minutes/Week): 225, Intensity: Mild, Exercise limited by: None identified  Goals    . Patient Stated     Decrease weight and continue exercise.       Depression Screen PHQ 2/9 Scores 10/12/2020 04/16/2020 10/09/2019 03/26/2019 09/24/2018 03/28/2018 10/27/2017  PHQ - 2 Score 0 2 0 0 0 0 0  PHQ- 9 Score - 2 - 0 0 - -    Fall Risk Fall Risk  10/12/2020 04/16/2020 10/09/2019 03/26/2019 09/24/2018  Falls in the past year? 0 0 0 0 0  Number falls in past yr: 0 0 0 0 -  Injury with Fall? 0 0 0 0 -  Follow up Falls prevention discussed Falls evaluation completed Falls evaluation completed;Education provided;Falls prevention discussed - -    FALL RISK PREVENTION PERTAINING TO THE HOME:  Any stairs in or around the home? Yes  If so, are there any  without handrails? No  Home free of loose throw rugs in walkways, pet beds, electrical cords, etc? Yes  Adequate lighting in your home to reduce risk of falls? Yes   ASSISTIVE DEVICES UTILIZED  TO PREVENT FALLS:  Life alert? No  Use of a cane, walker or w/c? No  Grab bars in the bathroom? No  Shower chair or bench in shower? No  Elevated toilet seat or a handicapped toilet? No   TIMED UP AND GO:  Was the test performed? Yes .  Length of time to ambulate 10 feet: 9 sec.   Gait steady and fast without use of assistive device  Cognitive Function:Normal cognitive status assessed by direct observation by this Nurse Health Advisor. No abnormalities found.   MMSE - Mini Mental State Exam 03/28/2018  Orientation to time 5  Orientation to Place 5  Registration 3  Attention/ Calculation 5  Recall 3  Language- name 2 objects 2  Language- repeat 1  Language- follow 3 step command 3  Language- read & follow direction 1  Write a sentence 1  Copy design 1  Total score 30        Immunizations Immunization History  Administered Date(s) Administered  . Influenza,inj,Quad PF,6+ Mos 07/09/2014, 07/02/2015, 06/28/2016, 09/18/2017  . Influenza-Unspecified 06/09/2018  . Moderna Sars-Covid-2 Vaccination 10/11/2019, 11/08/2019  . Pneumococcal Conjugate-13 09/23/2016  . Pneumococcal Polysaccharide-23 03/28/2018  . Td 03/08/2019    TDAP status: Up to date  Flu Vaccine status: Up to date-Per patient. Patient to bring documentation to next office visit  Pneumococcal vaccine status: Up to date  Covid-19 vaccine status: Completed vaccines Per patient. Patient to bring documentation of booster to next office visit  Qualifies for Shingles Vaccine? Yes   Zostavax completed No   Shingrix Completed?: No.    Education has been provided regarding the importance of this vaccine. Patient has been advised to call insurance company to determine out of pocket expense if they have not yet received this  vaccine. Advised may also receive vaccine at local pharmacy or Health Dept. Verbalized acceptance and understanding.  Screening Tests Health Maintenance  Topic Date Due  . COVID-19 Vaccine (3 - Booster for Moderna series) 05/07/2020  . COLONOSCOPY (Pts 45-38yrs Insurance coverage will need to be confirmed)  07/25/2026  . TETANUS/TDAP  03/07/2029  . INFLUENZA VACCINE  Completed  . PNA vac Low Risk Adult  Completed  . Hepatitis C Screening  Discontinued    Health Maintenance  Health Maintenance Due  Topic Date Due  . COVID-19 Vaccine (3 - Booster for Moderna series) 05/07/2020    Colorectal cancer screening: Type of screening: Colonoscopy. Completed 07/25/2016. Repeat every 10 years  Lung Cancer Screening: (Low Dose CT Chest recommended if Age 4-80 years, 30 pack-year currently smoking OR have quit w/in 15years.) does not qualify.    Additional Screening:  Hepatitis C Screening: does qualify; Discuss with PCP  Vision Screening: Recommended annual ophthalmology exams for early detection of glaucoma and other disorders of the eye. Is the patient up to date with their annual eye exam?  Yes  Who is the provider or what is the name of the office in which the patient attends annual eye exams? Dr. Valetta Close   Dental Screening: Recommended annual dental exams for proper oral hygiene  Community Resource Referral / Chronic Care Management: CRR required this visit?  No   CCM required this visit?  No      Plan:     I have personally reviewed and noted the following in the patient's chart:   . Medical and social history . Use of alcohol, tobacco or illicit drugs  . Current medications and supplements . Functional ability and status . Nutritional status .  Physical activity . Advanced directives . List of other physicians . Hospitalizations, surgeries, and ER visits in previous 12 months . Vitals . Screenings to include cognitive, depression, and falls . Referrals and  appointments  In addition, I have reviewed and discussed with patient certain preventive protocols, quality metrics, and best practice recommendations. A written personalized care plan for preventive services as well as general preventive health recommendations were provided to patient.   Patient to access avs on mychart.  Marta Antu, LPN   11/07/3333  Nurse health [place]  Nurse Notes: None

## 2020-10-12 NOTE — Patient Instructions (Signed)
[name] Brown , Thank you for taking time to come for your Medicare Wellness Visit. I appreciate your ongoing commitment to your health goals. Please review the following plan we discussed and let me know if I can assist you in the future.   Screening recommendations/referrals: Colonoscopy: Completed 07/25/2026-Due 07/25/2026 Recommended yearly ophthalmology/optometry visit for glaucoma screening and checkup Recommended yearly dental visit for hygiene and checkup  Vaccinations: Influenza vaccine: Up to date-Please bring documentation to your next visit. Pneumococcal vaccine: Completed vaccines Tdap vaccine: Up to date-Due-03/07/2029 Shingles vaccine: Discuss with pharmacy Covid-19: Up to date-Please bring documentation to your next visit  Advanced directives: Copy in chart  Conditions/risks identified: See problem list  Next appointment: Follow up in one year for your annual wellness visit.   Preventive Care 67 Years and Older, Male Preventive care refers to lifestyle choices and visits with your health care provider that can promote health and wellness. What does preventive care include?  A yearly physical exam. This is also called an annual well check.  Dental exams once or twice a year.  Routine eye exams. Ask your health care provider how often you should have your eyes checked.  Personal lifestyle choices, including:  Daily care of your teeth and gums.  Regular physical activity.  Eating a healthy diet.  Avoiding tobacco and drug use.  Limiting alcohol use.  Practicing safe sex.  Taking low doses of aspirin every day.  Taking vitamin and mineral supplements as recommended by your health care provider. What happens during an annual well check? The services and screenings done by your health care provider during your annual well check will depend on your age, overall health, lifestyle risk factors, and family history of disease. Counseling  Your health care provider  may ask you questions about your:  Alcohol use.  Tobacco use.  Drug use.  Emotional well-being.  Home and relationship well-being.  Sexual activity.  Eating habits.  History of falls.  Memory and ability to understand (cognition).  Work and work Statistician. Screening  You may have the following tests or measurements:  Height, weight, and BMI.  Blood pressure.  Lipid and cholesterol levels. These may be checked every 5 years, or more frequently if you are over 93 years old.  Skin check.  Lung cancer screening. You may have this screening every year starting at age 48 if you have a 30-pack-year history of smoking and currently smoke or have quit within the past 15 years.  Fecal occult blood test (FOBT) of the stool. You may have this test every year starting at age 54.  Flexible sigmoidoscopy or colonoscopy. You may have a sigmoidoscopy every 5 years or a colonoscopy every 10 years starting at age 42.  Prostate cancer screening. Recommendations will vary depending on your family history and other risks.  Hepatitis C blood test.  Hepatitis B blood test.  Sexually transmitted disease (STD) testing.  Diabetes screening. This is done by checking your blood sugar (glucose) after you have not eaten for a while (fasting). You may have this done every 1-3 years.  Abdominal aortic aneurysm (AAA) screening. You may need this if you are a current or former smoker.  Osteoporosis. You may be screened starting at age 62 if you are at high risk. Talk with your health care provider about your test results, treatment options, and if necessary, the need for more tests. Vaccines  Your health care provider may recommend certain vaccines, such as:  Influenza vaccine. This is recommended every  year.  Tetanus, diphtheria, and acellular pertussis (Tdap, Td) vaccine. You may need a Td booster every 10 years.  Zoster vaccine. You may need this after age 20.  Pneumococcal 13-valent  conjugate (PCV13) vaccine. One dose is recommended after age 63.  Pneumococcal polysaccharide (PPSV23) vaccine. One dose is recommended after age 62. Talk to your health care provider about which screenings and vaccines you need and how often you need them. This information is not intended to replace advice given to you by your health care provider. Make sure you discuss any questions you have with your health care provider. Document Released: 09/25/2015 Document Revised: 05/18/2016 Document Reviewed: 06/30/2015 Elsevier Interactive Patient Education  2017 [place] Prevention in the Home Falls can cause injuries. They can happen to people of all ages. There are many things you can do to make your home safe and to help prevent falls. What can I do on the outside of my home?  Regularly fix the edges of walkways and driveways and fix any cracks.  Remove anything that might make you trip as you walk through a door, such as a raised step or threshold.  Trim any bushes or trees on the path to your home.  Use bright outdoor lighting.  Clear any walking paths of anything that might make someone trip, such as rocks or tools.  Regularly check to see if handrails are loose or broken. Make sure that both sides of any steps have handrails.  Any raised decks and porches should have guardrails on the edges.  Have any leaves, snow, or ice cleared regularly.  Use sand or salt on walking paths during winter.  Clean up any spills in your garage right away. This includes oil or grease spills. What can I do in the bathroom?  Use night lights.  Install grab bars by the toilet and in the tub and shower. Do not use towel bars as grab bars.  Use non-skid mats or decals in the tub or shower.  If you need to sit down in the shower, use a plastic, non-slip stool.  Keep the floor dry. Clean up any water that spills on the floor as soon as it happens.  Remove soap buildup in the tub or  shower regularly.  Attach bath mats securely with double-sided non-slip rug tape.  Do not have throw rugs and other things on the floor that can make you trip. What can I do in the bedroom?  Use night lights.  Make sure that you have a light by your bed that is easy to reach.  Do not use any sheets or blankets that are too big for your bed. They should not hang down onto the floor.  Have a firm chair that has side arms. You can use this for support while you get dressed.  Do not have throw rugs and other things on the floor that can make you trip. What can I do in the kitchen?  Clean up any spills right away.  Avoid walking on wet floors.  Keep items that you use a lot in easy-to-reach places.  If you need to reach something above you, use a strong step stool that has a grab bar.  Keep electrical cords out of the way.  Do not use floor polish or wax that makes floors slippery. If you must use wax, use non-skid floor wax.  Do not have throw rugs and other things on the floor that can make you trip. What can  I do with my stairs?  Do not leave any items on the stairs.  Make sure that there are handrails on both sides of the stairs and use them. Fix handrails that are broken or loose. Make sure that handrails are as long as the stairways.  Check any carpeting to make sure that it is firmly attached to the stairs. Fix any carpet that is loose or worn.  Avoid having throw rugs at the top or bottom of the stairs. If you do have throw rugs, attach them to the floor with carpet tape.  Make sure that you have a light switch at the top of the stairs and the bottom of the stairs. If you do not have them, ask someone to add them for you. What else can I do to help prevent falls?  Wear shoes that:  Do not have high heels.  Have rubber bottoms.  Are comfortable and fit you well.  Are closed at the toe. Do not wear sandals.  If you use a stepladder:  Make sure that it is fully  opened. Do not climb a closed stepladder.  Make sure that both sides of the stepladder are locked into place.  Ask someone to hold it for you, if possible.  Clearly mark and make sure that you can see:  Any grab bars or handrails.  First and last steps.  Where the edge of each step is.  Use tools that help you move around (mobility aids) if they are needed. These include:  Canes.  Walkers.  Scooters.  Crutches.  Turn on the lights when you go into a dark area. Replace any light bulbs as soon as they burn out.  Set up your furniture so you have a clear path. Avoid moving your furniture around.  If any of your floors are uneven, fix them.  If there are any pets around you, be aware of where they are.  Review your medicines with your doctor. Some medicines can make you feel dizzy. This can increase your chance of falling. Ask your doctor what other things that you can do to help prevent falls. This information is not intended to replace advice given to you by your health care provider. Make sure you discuss any questions you have with your health care provider. Document Released: 06/25/2009 Document Revised: 02/04/2016 Document Reviewed: 10/03/2014 Elsevier Interactive Patient Education  2017 [employer].

## 2020-10-14 DIAGNOSIS — H25812 Combined forms of age-related cataract, left eye: Secondary | ICD-10-CM | POA: Diagnosis not present

## 2020-10-14 DIAGNOSIS — H2512 Age-related nuclear cataract, left eye: Secondary | ICD-10-CM | POA: Diagnosis not present

## 2020-10-16 ENCOUNTER — Other Ambulatory Visit: Payer: Self-pay

## 2020-10-16 ENCOUNTER — Encounter (INDEPENDENT_AMBULATORY_CARE_PROVIDER_SITE_OTHER): Payer: Medicare Other | Admitting: Ophthalmology

## 2020-10-16 DIAGNOSIS — H35033 Hypertensive retinopathy, bilateral: Secondary | ICD-10-CM | POA: Diagnosis not present

## 2020-10-16 DIAGNOSIS — I10 Essential (primary) hypertension: Secondary | ICD-10-CM

## 2020-10-16 DIAGNOSIS — H43813 Vitreous degeneration, bilateral: Secondary | ICD-10-CM | POA: Diagnosis not present

## 2020-10-16 DIAGNOSIS — H33302 Unspecified retinal break, left eye: Secondary | ICD-10-CM | POA: Diagnosis not present

## 2020-10-19 ENCOUNTER — Other Ambulatory Visit: Payer: Self-pay

## 2020-10-19 ENCOUNTER — Encounter: Payer: Self-pay | Admitting: Family Medicine

## 2020-10-19 ENCOUNTER — Ambulatory Visit (INDEPENDENT_AMBULATORY_CARE_PROVIDER_SITE_OTHER): Payer: Medicare Other | Admitting: Family Medicine

## 2020-10-19 VITALS — BP 138/80 | HR 71 | Temp 98.4°F | Resp 19 | Ht 66.0 in | Wt 170.8 lb

## 2020-10-19 DIAGNOSIS — E7849 Other hyperlipidemia: Secondary | ICD-10-CM | POA: Diagnosis not present

## 2020-10-19 DIAGNOSIS — E039 Hypothyroidism, unspecified: Secondary | ICD-10-CM

## 2020-10-19 DIAGNOSIS — I480 Paroxysmal atrial fibrillation: Secondary | ICD-10-CM

## 2020-10-19 DIAGNOSIS — I428 Other cardiomyopathies: Secondary | ICD-10-CM | POA: Diagnosis not present

## 2020-10-19 DIAGNOSIS — I10 Essential (primary) hypertension: Secondary | ICD-10-CM

## 2020-10-19 LAB — LIPID PANEL
Cholesterol: 162 mg/dL (ref 0–200)
HDL: 51.1 mg/dL (ref >39.00)
LDL Cholesterol: 77 mg/dL (ref 0–99)
NonHDL: 110.78
Total CHOL/HDL Ratio: 3
Triglycerides: 167 mg/dL — ABNORMAL HIGH (ref 0.0–149.0)
VLDL: 33.4 mg/dL (ref 0.0–40.0)

## 2020-10-19 LAB — HEPATIC FUNCTION PANEL
ALT: 52 U/L (ref 0–53)
AST: 36 U/L (ref 0–37)
Albumin: 4.6 g/dL (ref 3.5–5.2)
Alkaline Phosphatase: 100 U/L (ref 39–117)
Bilirubin, Direct: 0.1 mg/dL (ref 0.0–0.3)
Total Bilirubin: 0.6 mg/dL (ref 0.2–1.2)
Total Protein: 7.2 g/dL (ref 6.0–8.3)

## 2020-10-19 LAB — CBC WITH DIFFERENTIAL/PLATELET
Basophils Absolute: 0 10*3/uL (ref 0.0–0.1)
Basophils Relative: 0.8 % (ref 0.0–3.0)
Eosinophils Absolute: 0.5 10*3/uL (ref 0.0–0.7)
Eosinophils Relative: 8.4 % — ABNORMAL HIGH (ref 0.0–5.0)
HCT: 47.4 % (ref 39.0–52.0)
Hemoglobin: 16.3 g/dL (ref 13.0–17.0)
Lymphocytes Relative: 15.2 % (ref 12.0–46.0)
Lymphs Abs: 0.8 10*3/uL (ref 0.7–4.0)
MCHC: 34.5 g/dL (ref 30.0–36.0)
MCV: 96.4 fl (ref 78.0–100.0)
Monocytes Absolute: 0.5 10*3/uL (ref 0.1–1.0)
Monocytes Relative: 9.5 % (ref 3.0–12.0)
Neutro Abs: 3.5 10*3/uL (ref 1.4–7.7)
Neutrophils Relative %: 66.1 % (ref 43.0–77.0)
Platelets: 166 10*3/uL (ref 150.0–400.0)
RBC: 4.91 Mil/uL (ref 4.22–5.81)
RDW: 12.9 % (ref 11.5–15.5)
WBC: 5.4 10*3/uL (ref 4.0–10.5)

## 2020-10-19 LAB — BASIC METABOLIC PANEL
BUN: 21 mg/dL (ref 6–23)
CO2: 30 mEq/L (ref 19–32)
Calcium: 10 mg/dL (ref 8.4–10.5)
Chloride: 100 mEq/L (ref 96–112)
Creatinine, Ser: 1.16 mg/dL (ref 0.40–1.50)
GFR: 63.98 mL/min (ref >60.00)
Glucose, Bld: 102 mg/dL — ABNORMAL HIGH (ref 70–99)
Potassium: 4.9 mEq/L (ref 3.5–5.1)
Sodium: 135 mEq/L (ref 135–145)

## 2020-10-19 LAB — TSH: TSH: 2.07 u[IU]/mL (ref 0.35–4.50)

## 2020-10-19 NOTE — Assessment & Plan Note (Signed)
Chronic problem.  On Zetia and Pravastatin w/o difficulty.  Check labs.  Adjust meds prn  °

## 2020-10-19 NOTE — Assessment & Plan Note (Signed)
Last ECHO showed normal EF.  On ASA, ARB, and beta blocker.  Following w/ Cards.  Will follow along.

## 2020-10-19 NOTE — Assessment & Plan Note (Signed)
Chronic problem.  Well controlled on Losartan and Metoprolol.  Check labs.  No anticipated med changes.  Will follow.

## 2020-10-19 NOTE — Progress Notes (Signed)
   Subjective:    Patient ID: [name] Brown, male    DOB: 09/02/1951, 70 y.o.   MRN: [id number]  HPI HTN- chronic problem, on Losartan 50mg  daily and Metoprolol 25mg  daily w/ adequate control.  No CP, SOB, HAs, edema.  Hyperlipidemia- chronic problem, on Zetia 10mg  daily and Pravastatin 20mg  daily.  Pt has started exercising more regularly- [place] 5x/week.  No abd pain, N/V.  Hypothyroid- chronic problem, on Levothyroxine 21mcg daily.  Some decreased energy.  No changes to skin/hair/nails.  Cardiomyopathy-chronic problem, following w/ Cards.  On ASA, ARB, beta blocker.  ECHO done July 2020 showed normal EF.  PAF- on ASA and Metoprolol.  Pt reports he has been asymptomatic.  It was thought to have been a post-op issue.    Review of Systems For ROS see HPI   This visit occurred during the SARS-CoV-2 public health emergency.  Safety protocols were in place, including screening questions prior to the visit, additional usage of staff PPE, and extensive cleaning of exam room while observing appropriate contact time as indicated for disinfecting solutions.       Objective:   Physical Exam Vitals reviewed.  Constitutional:      General: He is not in acute distress.    Appearance: Normal appearance. He is well-developed and well-nourished.  HENT:     Head: Normocephalic and atraumatic.  Eyes:     Extraocular Movements: EOM normal.     Conjunctiva/sclera: Conjunctivae normal.     Pupils: Pupils are equal, round, and reactive to light.  Neck:     Thyroid: No thyromegaly.  Cardiovascular:     Rate and Rhythm: Normal rate and regular rhythm.     Pulses: Intact distal pulses.     Heart sounds: No murmur heard.     Comments: Mechanical click Pulmonary:     Effort: Pulmonary effort is normal. No respiratory distress.     Breath sounds: Normal breath sounds.  Abdominal:     General: Bowel sounds are normal. There is no distension.     Palpations: Abdomen is soft.  Musculoskeletal:         General: No edema.     Cervical back: Normal range of motion and neck supple.     Right lower leg: No edema.     Left lower leg: No edema.  Lymphadenopathy:     Cervical: No cervical adenopathy.  Skin:    General: Skin is warm and dry.  Neurological:     Mental Status: He is alert and oriented to person, place, and time.     Cranial Nerves: No cranial nerve deficit.  Psychiatric:        Mood and Affect: Mood and affect normal.        Behavior: Behavior normal.           Assessment & Plan:

## 2020-10-19 NOTE — Patient Instructions (Signed)
Follow up in 6 months to recheck BP and cholesterol We'll notify you of your lab results and make any changes if needed Continue to work on healthy diet and regular exercise- you're doing great! Call with any questions or concerns Stay Safe!  Stay Healthy!! 

## 2020-10-19 NOTE — Assessment & Plan Note (Signed)
Chronic problem.  Some low energy but otherwise asymptomatic.  Check labs.  Adjust meds prn

## 2020-10-19 NOTE — Assessment & Plan Note (Signed)
Thought to have been a one time, post-op complication.  Currently asymptomatic.  On ASA and beta blocker.

## 2020-10-22 DIAGNOSIS — H3581 Retinal edema: Secondary | ICD-10-CM | POA: Insufficient documentation

## 2020-10-26 ENCOUNTER — Encounter (INDEPENDENT_AMBULATORY_CARE_PROVIDER_SITE_OTHER): Payer: Medicare Other | Admitting: Ophthalmology

## 2020-10-26 ENCOUNTER — Other Ambulatory Visit: Payer: Self-pay

## 2020-10-26 DIAGNOSIS — H33302 Unspecified retinal break, left eye: Secondary | ICD-10-CM

## 2020-10-28 DIAGNOSIS — H2511 Age-related nuclear cataract, right eye: Secondary | ICD-10-CM | POA: Diagnosis not present

## 2020-10-28 DIAGNOSIS — H25811 Combined forms of age-related cataract, right eye: Secondary | ICD-10-CM | POA: Diagnosis not present

## 2020-11-05 DIAGNOSIS — H3581 Retinal edema: Secondary | ICD-10-CM | POA: Diagnosis not present

## 2020-11-12 ENCOUNTER — Encounter (INDEPENDENT_AMBULATORY_CARE_PROVIDER_SITE_OTHER): Payer: Medicare Other | Admitting: Ophthalmology

## 2020-11-12 ENCOUNTER — Other Ambulatory Visit: Payer: Self-pay

## 2020-11-12 DIAGNOSIS — I10 Essential (primary) hypertension: Secondary | ICD-10-CM

## 2020-11-12 DIAGNOSIS — H43813 Vitreous degeneration, bilateral: Secondary | ICD-10-CM

## 2020-11-12 DIAGNOSIS — H33302 Unspecified retinal break, left eye: Secondary | ICD-10-CM | POA: Diagnosis not present

## 2020-11-12 DIAGNOSIS — H35033 Hypertensive retinopathy, bilateral: Secondary | ICD-10-CM | POA: Diagnosis not present

## 2020-11-12 DIAGNOSIS — H59033 Cystoid macular edema following cataract surgery, bilateral: Secondary | ICD-10-CM

## 2020-11-17 ENCOUNTER — Other Ambulatory Visit: Payer: Self-pay | Admitting: Emergency Medicine

## 2020-11-17 DIAGNOSIS — E039 Hypothyroidism, unspecified: Secondary | ICD-10-CM

## 2020-11-17 MED ORDER — LEVOTHYROXINE SODIUM 50 MCG PO TABS: 50.0000 ug | ORAL_TABLET | Freq: Every day | ORAL | 1 refills | Status: DC

## 2020-11-23 NOTE — Progress Notes (Unsigned)
Cardiology Clinic Note   Patient Name: Gaylon Bentz Date of Encounter: 11/24/2020  Primary Care Provider:  [name], MD Primary Cardiologist:  Skeet Latch, MD  Patient Profile    Rondarius Kadrmas 70 year old male presents the clinic today for follow-up evaluation of his essential hypertension and coronary artery disease.  Past Medical History    Past Medical History:  Diagnosis Date  . Aortic stenosis   . Ascending aortic aneurysm ([place]) 09/18/2018  . Colon cancer ([place])   . Family history of adverse reaction to anesthesia    pt states his mother had 2 episodes of hyponatremia following anesthesia   . GERD (gastroesophageal reflux disease)   . Headache   . Heart murmur   . Hyperlipidemia 10/25/2016  . Hypertension    pt is currently not taking any medications; pt states is borderline  . Melanoma ([place])   . OSA on CPAP 09/18/2018  . PAF (paroxysmal atrial fibrillation) ([place]) 06/13/2017  . Perforated sigmoid colon ([place])   . PVC's (premature ventricular contractions) 10/25/2016  . Tinnitus   . Wears glasses    Past Surgical History:  Procedure Laterality Date  . AORTIC VALVE REPLACEMENT N/A 06/08/2017   Procedure: AORTIC VALVE REPLACEMENT (AVR);  Surgeon: [name], MD;  Location: [facility];  Service: Open Heart Surgery;  Laterality: N/A;  Using 54mm Perimount Magna Ease Pericardial Bioprosthesis Aortic Valve  . COLOSTOMY CLOSURE N/A 08/18/2016   Procedure: COLOSTOMY CLOSURE OPEN PROCEDURE;  Surgeon: Armandina Gemma, MD;  Location: [facility];  Service: General;  Laterality: N/A;  . LAPAROTOMY N/A 04/25/2016   Procedure: EXPLORATORY LAPAROTOMY WITH SIGMOID COLECTOMY, COLOSTOMY;  Surgeon: Armandina Gemma, MD;  Location: [facility];  Service: General;  Laterality: N/A;  . LEFT HEART CATH AND CORONARY ANGIOGRAPHY N/A 11/03/2016   Procedure: Left Heart Cath and Coronary Angiography;  Surgeon: Burnell Blanks, MD;  Location: [facility];  Service: Cardiovascular;  Laterality:  N/A;  . MELANOMA EXCISION    . PARTIAL COLECTOMY Right 08/18/2016   Procedure: RIGHT COLECTOMY;  Surgeon: Armandina Gemma, MD;  Location: [facility];  Service: General;  Laterality: Right;  . TEE WITHOUT CARDIOVERSION N/A 06/08/2017   Procedure: TRANSESOPHAGEAL ECHOCARDIOGRAM (TEE);  Surgeon: [name], MD;  Location: [facility];  Service: Open Heart Surgery;  Laterality: N/A;    Allergies  No Known Allergies  History of Present Illness    Mr. Shadwick has a PMH of HTN, CAD, aortic stenosis with bicuspid valve s/p aortic valve replacement, nonischemic cardiomyopathy LVEF 30-35% improved to 60-65%, PAF, OSA on CPAP, and hyperlipidemia.  His PMH also includes colon cancer status post colectomy.  He was initially referred by his PCP after coronary atherosclerosis was noted on CXR.  He was started on ASA and referred to cardiology.  He underwent cardiac catheterization 2/18 which showed mild-moderate coronary artery disease and an LVEF of 45-50%.  He underwent dobutamine stress test and was noted to have gradient increased to 38 mmHg.  With dobutamine infusion.  He underwent AVR 06/08/2017.  His hospitalization was complicated by atrial fibrillation and he was started on amiodarone which was discontinued 3 months postop.  His echocardiogram 7/20 showed an increased EF of 60-65%.  He was noted to have a mean gradient across his aortic valve of 13 mmHg.  He was also noted to have 3.4 cm aortic ascending aneurysm.  He was referred for 1 CTA which showed ascending aorta was 3.6 cm.  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  He was last seen by Dr. Oval Linsey on 07/02/2020.  During that time he was doing well.  He reported exercising 5 times per week using the elliptical and treadmill.  He was also lifting weights.  He reported he felt well with his exercise and had no increased chest pain or shortness of breath.  He denied lower extremity edema, orthopnea, and PND.  He did report  mild lightheadedness at times.  His blood pressure was well controlled 116/68.  He was previously prescribed sildenafil but did not note relief of his ED.  He presents the clinic today for follow-up evaluation states he feels well.  He continues to exercise about 25 minutes daily.  He splits his time between elliptical and the treadmill.  He reports that his blood pressure at home runs in the 120s over 70s.  He does note some increased work of breathing with increased physical activity.  However his breathing is normal with normal activities.  He also reports that he occasionally feels his PVCs at night.  He reports that he does continue to have occasional dizziness.  When asked about hydration he reports that he could hydrate better but did notice an improvement when his metoprolol was decreased.  I will decrease his metoprolol to 12.5 mg daily and increase his losartan to 75 mg daily.  I will order a BMP in 1 week, have him maintain a blood pressure log, give him a salty 6 diet sheet, and have him follow-up in 6 months.  We will order his aorta MRA to be done in September and have him return for follow-up after.  Today he denies chest pain, shortness of breath, lower extremity edema, fatigue, palpitations, melena, hematuria, hemoptysis, diaphoresis, weakness, presyncope, syncope, orthopnea, and PND.   Home Medications    Prior to Admission medications   Medication Sig Start Date End Date Taking? Authorizing Provider  aspirin EC 81 MG tablet Take 81 mg by mouth daily.    [provider]  cetirizine (ZYRTEC) 10 MG tablet Take 10 mg by mouth daily as needed for allergies.    [provider]  ezetimibe (ZETIA) 10 MG tablet TAKE ONE TABLET BY MOUTH DAILY 09/01/20   Skeet Latch, MD  famotidine (PEPCID) 20 MG tablet Take 20 mg by mouth daily as needed for heartburn or indigestion.    [provider]  fluticasone (FLONASE) 50 MCG/ACT nasal spray Place into both nostrils  daily.    [provider]  levothyroxine (SYNTHROID) 50 MCG tablet Take 1 tablet (50 mcg total) by mouth daily. 11/17/20   [name], MD  losartan (COZAAR) 50 MG tablet TAKE ONE TABLET BY MOUTH DAILY 08/04/20   Skeet Latch, MD  metoprolol succinate (TOPROL-XL) 25 MG 24 hr tablet Take 1 tablet (25 mg total) by mouth daily. 09/01/20   Skeet Latch, MD  pravastatin (PRAVACHOL) 20 MG tablet Take 1 tablet (20 mg total) by mouth daily. 07/02/20   Skeet Latch, MD  rOPINIRole (REQUIP) 0.25 MG tablet Take 3 tablets (0.75 mg total) by mouth at bedtime. 09/22/20   Star Age, MD  tadalafil (CIALIS) 20 MG tablet Take 20 mg by mouth daily as needed for erectile dysfunction.    [provider]    Family History    Family History  Problem Relation Age of Onset  . Dementia Mother   . Kidney disease Mother   . Vascular Disease Mother   . CAD Mother   .  Heart attack Father   . Alcohol abuse Father   . Cirrhosis Father   . Stroke Maternal Grandmother   . Stroke Paternal Grandmother    He indicated that his mother is deceased. He indicated that his father is deceased. He indicated that his maternal grandmother is deceased. He indicated that his maternal grandfather is deceased. He indicated that his paternal grandmother is deceased. He indicated that his paternal grandfather is deceased.  Social History    Social History   Socioeconomic History  . Marital status: Married    Spouse name: Not on file  . Number of children: Not on file  . Years of education: Not on file  . Highest education level: Not on file  Occupational History  . Occupation: Retired Software engineer  Tobacco Use  . Smoking status: Never Smoker  . Smokeless tobacco: Never Used  Vaping Use  . Vaping Use: Never used  Substance and Sexual Activity  . Alcohol use: Yes    Alcohol/week: 21.0 - 24.0 standard drinks    Types: 21 - 24 Standard drinks or equivalent per week    Comment: 2-3  glasses of wine daily   . Drug use: No  . Sexual activity: Not on file  Other Topics Concern  . Not on file  Social History Narrative   Pt lives in [place] with his wife, they are active.      Pt lives at home with his wife.      1 Cat    Social Determinants of Health   Financial Resource Strain: Low Risk   . Difficulty of Paying Living Expenses: Not hard at all  Food Insecurity: No Food Insecurity  . Worried About Charity fundraiser in the Last Year: Never true  . Ran Out of Food in the Last Year: Never true  Transportation Needs: No Transportation Needs  . Lack of Transportation (Medical): No  . Lack of Transportation (Non-Medical): No  Physical Activity: Sufficiently Active  . Days of Exercise per Week: 5 days  . Minutes of Exercise per Session: 40 min  Stress: No Stress Concern Present  . Feeling of Stress : Not at all  Social Connections: Moderately Isolated  . Frequency of Communication with Friends and Family: More than three times a week  . Frequency of Social Gatherings with Friends and Family: More than three times a week  . Attends Religious Services: Never  . Active Member of Clubs or Organizations: No  . Attends Archivist Meetings: Never  . Marital Status: Married  Human resources officer Violence: Not At Risk  . Fear of Current or Ex-Partner: No  . Emotionally Abused: No  . Physically Abused: No  . Sexually Abused: No     Review of Systems    General:  No chills, fever, night sweats or weight changes.  Cardiovascular:  No chest pain, dyspnea on exertion, edema, orthopnea, palpitations, paroxysmal nocturnal dyspnea. Dermatological: No rash, lesions/masses Respiratory: No cough, dyspnea Urologic: No hematuria, dysuria Abdominal:   No nausea, vomiting, diarrhea, bright red blood per rectum, melena, or hematemesis Neurologic:  No visual changes, wkns, changes in mental status. All other systems reviewed and are otherwise negative except as noted  above.  Physical Exam    VS:  BP (!) 142/98 Comment: 138/76 left arm sitting, normal cuff  Pulse 68   Ht 5\' 6"  (1.676 m)   Wt 175 lb 12.8 oz (79.7 kg)   SpO2 99%   BMI 28.37 kg/m  , BMI Body  mass index is 28.37 kg/m. GEN: Well nourished, well developed, in no acute distress. HEENT: normal. Neck: Supple, no JVD, carotid bruits, or masses. Cardiac: RRR, no murmurs, rubs, or gallops. No clubbing, cyanosis, edema.  Radials/DP/PT 2+ and equal bilaterally.  Respiratory:  Respirations regular and unlabored, clear to auscultation bilaterally. GI: Soft, nontender, nondistended, BS + x 4. MS: no deformity or atrophy. Skin: warm and dry, no rash. Neuro:  Strength and sensation are intact. Psych: Normal affect.  Accessory Clinical Findings    Recent Labs: 10/19/2020: ALT 52; BUN 21; Creatinine, Ser 1.16; Hemoglobin 16.3; Platelets 166.0; Potassium 4.9; Sodium 135; TSH 2.07   Recent Lipid Panel    Component Value Date/Time   CHOL 162 10/19/2020 1326   CHOL 166 09/01/2020 0925   TRIG 167.0 (H) 10/19/2020 1326   HDL 51.10 10/19/2020 1326   HDL 58 09/01/2020 0925   CHOLHDL 3 10/19/2020 1326   VLDL 33.4 10/19/2020 1326   LDLCALC 77 10/19/2020 1326   LDLCALC 92 09/01/2020 0925   LDLDIRECT 92.0 10/09/2019 1117    ECG personally reviewed by me today-none today.  Cardiac catheterization 11/03/2016  Mid RCA to Dist RCA lesion, 10 %stenosed.  RPDA lesion, 10 %stenosed.  Ost Cx to Prox Cx lesion, 40 %stenosed.  Ost Ramus to Ramus lesion, 20 %stenosed.  Mid LAD lesion, 20 %stenosed.  Prox LAD lesion, 30 %stenosed.  The left ventricular ejection fraction is 45-50% by visual estimate.  There is mild left ventricular systolic dysfunction.  LV end diastolic pressure is normal.  There is no mitral valve regurgitation.   Echocardiogram 7/20 IMPRESSIONS  1. The left ventricle has normal systolic function with an ejection fraction of 60-65%. The cavity size was normal. Left  ventricular diastolic Doppler parameters are consistent with impaired relaxation. 2. The right ventricle has normal systolic function. The cavity was normal. There is no increase in right ventricular wall thickness.  SUMMARY  When compared to the prior study from12/19/2019 there is no significant change. Transaortic gradients across the bioprosthetic valve remain within upper normal limit.  Assessment & Plan   1.  Chronic systolic and diastolic CHF-no increased DOE or activity intolerance.  Echocardiogram 7/20 showed normal LVEF and normal placement of his aortic valve/well-functioning. Increase losartan to 75 mg Decrease metoprolol 12.5 mg Heart healthy low-sodium diet-salty 6 given Increase physical activity as tolerated  Nonobstructive coronary artery disease/hyperlipidemia-10/19/2020: Cholesterol 162; HDL 51.10; LDL Cholesterol 77; Triglycerides 167.0; VLDL 33.4 Continue pravastatin, Zetia, aspirin Heart healthy low-sodium high-fiber diet Increase physical activity as tolerated  Bicuspid aortic valve status post AVR-no increased DOE or activity intolerance.  Echocardiogram 7/20 showed well-placed aortic valve and EF 60-65%. Continue losartan, metoprolol Heart healthy low-sodium diet-salty 6 given Increase physical activity as tolerated  Essential hypertension-BP today 138/76.  Well-controlled at home 120's/70 Continue losartan, metoprolol Heart healthy low-sodium diet-salty 6 given Increase physical activity as tolerated  Ascending aortic aneurysm-3.6 cm - 3.7 cm on CT 8/20 Order aorta MRA for 9/22  Disposition: Follow-up with Dr. Oval Linsey in 6-7 months.   Jossie Ng. Cleaver NP-C    11/24/2020, 11:13 AM [place] Office [phone] Fax [phone]  Notice: This dictation was prepared with Dragon dictation along with smaller phrase technology. Any transcriptional errors that result from this process are  unintentional and may not be corrected upon review.  I spent 15 minutes examining this patient, reviewing medications, and using patient centered shared decision making involving her cardiac care.  Prior  to her visit I spent greater than 20 minutes reviewing her past medical history,  medications, and prior cardiac tests.

## 2020-11-24 ENCOUNTER — Encounter: Payer: Self-pay | Admitting: General Practice

## 2020-11-24 ENCOUNTER — Other Ambulatory Visit: Payer: Self-pay

## 2020-11-24 ENCOUNTER — Ambulatory Visit (INDEPENDENT_AMBULATORY_CARE_PROVIDER_SITE_OTHER): Payer: Medicare Other | Admitting: General Practice

## 2020-11-24 VITALS — BP 142/98 | HR 68 | Ht 66.0 in | Wt 175.8 lb

## 2020-11-24 DIAGNOSIS — I712 Thoracic aortic aneurysm, without rupture: Secondary | ICD-10-CM

## 2020-11-24 DIAGNOSIS — I2510 Atherosclerotic heart disease of native coronary artery without angina pectoris: Secondary | ICD-10-CM

## 2020-11-24 DIAGNOSIS — I10 Essential (primary) hypertension: Secondary | ICD-10-CM

## 2020-11-24 DIAGNOSIS — Z952 Presence of prosthetic heart valve: Secondary | ICD-10-CM

## 2020-11-24 DIAGNOSIS — I5042 Chronic combined systolic (congestive) and diastolic (congestive) heart failure: Secondary | ICD-10-CM

## 2020-11-24 DIAGNOSIS — I7121 Aneurysm of the ascending aorta, without rupture: Secondary | ICD-10-CM

## 2020-11-24 DIAGNOSIS — Z79899 Other long term (current) drug therapy: Secondary | ICD-10-CM | POA: Diagnosis not present

## 2020-11-24 MED ORDER — METOPROLOL SUCCINATE ER 25 MG PO TB24: 12.5000 mg | ORAL_TABLET | Freq: Every day | ORAL | 1 refills | Status: DC

## 2020-11-24 MED ORDER — LOSARTAN POTASSIUM 50 MG PO TABS: 75.0000 mg | ORAL_TABLET | Freq: Every day | ORAL | 3 refills | Status: DC

## 2020-11-24 NOTE — Patient Instructions (Signed)
Medication Instructions:  DECREASE METOPROLOL 12.5MG  DAILY  INCREASE LOSARTAN 75MG  DAILY *If you need a refill on your cardiac medications before your next appointment, please call your pharmacy*  Lab Work: BMET IN 1 MONTH 12-25-2020 THIS IS NOT FASTING If you have labs (blood work) drawn today and your tests are completely normal, you will receive your results only by:  [place] (if you have MyChart) OR A paper copy in the mail.  If you have any lab test that is abnormal or we need to change your treatment, we will call you to review the results. You may go to any [facility] that is convenient for you however, we do have a lab in our office that is able to assist you. You DO NOT need an appointment for our lab. The lab is open 8:00am and closes at 4:00pm. Lunch 12:45 - 1:45pm.  Testing/Procedures: PENDING MRI-WE WILL CALL LATER TO DISCUSS  Special Instructions TAKE AND LOG YOUR BLOOD PRESSURE-EVERY-OTHER-DAY, BRING LOG WITH YOU TO YOUR FOLLOW UP APPOINTMENT  PLEASE READ AND FOLLOW SALTY 6-ATTACHED-1,800 mg daily  Follow-Up: Your next appointment:  [place]  In Person with Skeet Latch, MD OR IF UNAVAILABLE [place], FNP-C   Please call our office 2 months in advance to schedule this appointment   At [facility], you and your health needs are our priority.  As part of our continuing mission to provide you with exceptional heart care, we have created designated Provider Care Teams.  These Care Teams include your primary Cardiologist (physician) and Advanced Practice Providers (APPs -  Physician Assistants and Nurse Practitioners) who all work together to provide you with the care you need, when you need it.

## 2020-12-14 ENCOUNTER — Encounter (INDEPENDENT_AMBULATORY_CARE_PROVIDER_SITE_OTHER): Payer: Medicare Other | Admitting: Ophthalmology

## 2020-12-14 ENCOUNTER — Other Ambulatory Visit: Payer: Self-pay

## 2020-12-14 DIAGNOSIS — H35033 Hypertensive retinopathy, bilateral: Secondary | ICD-10-CM

## 2020-12-14 DIAGNOSIS — H59033 Cystoid macular edema following cataract surgery, bilateral: Secondary | ICD-10-CM

## 2020-12-14 DIAGNOSIS — I10 Essential (primary) hypertension: Secondary | ICD-10-CM | POA: Diagnosis not present

## 2020-12-14 DIAGNOSIS — H33302 Unspecified retinal break, left eye: Secondary | ICD-10-CM

## 2020-12-14 DIAGNOSIS — H43813 Vitreous degeneration, bilateral: Secondary | ICD-10-CM | POA: Diagnosis not present

## 2020-12-23 DIAGNOSIS — Z952 Presence of prosthetic heart valve: Secondary | ICD-10-CM | POA: Diagnosis not present

## 2020-12-23 DIAGNOSIS — I2510 Atherosclerotic heart disease of native coronary artery without angina pectoris: Secondary | ICD-10-CM | POA: Diagnosis not present

## 2020-12-23 DIAGNOSIS — Z79899 Other long term (current) drug therapy: Secondary | ICD-10-CM | POA: Diagnosis not present

## 2020-12-23 DIAGNOSIS — I10 Essential (primary) hypertension: Secondary | ICD-10-CM | POA: Diagnosis not present

## 2020-12-23 DIAGNOSIS — I712 Thoracic aortic aneurysm, without rupture: Secondary | ICD-10-CM | POA: Diagnosis not present

## 2020-12-23 DIAGNOSIS — I5042 Chronic combined systolic (congestive) and diastolic (congestive) heart failure: Secondary | ICD-10-CM | POA: Diagnosis not present

## 2020-12-23 LAB — BASIC METABOLIC PANEL
BUN/Creatinine Ratio: 16 (ref 10–24)
BUN: 19 mg/dL (ref 8–27)
CO2: 25 mmol/L (ref 20–29)
Calcium: 10 mg/dL (ref 8.6–10.2)
Chloride: 99 mmol/L (ref 96–106)
Creatinine, Ser: 1.16 mg/dL (ref 0.76–1.27)
Glucose: 93 mg/dL (ref 65–99)
Potassium: 5.5 mmol/L — ABNORMAL HIGH (ref 3.5–5.2)
Sodium: 137 mmol/L (ref 134–144)
eGFR: 68 mL/min/{1.73_m2} (ref >59)

## 2020-12-24 ENCOUNTER — Other Ambulatory Visit: Payer: Self-pay

## 2020-12-24 ENCOUNTER — Telehealth: Payer: Self-pay | Admitting: Cardiovascular Disease

## 2020-12-24 DIAGNOSIS — I10 Essential (primary) hypertension: Secondary | ICD-10-CM

## 2020-12-24 DIAGNOSIS — Z79899 Other long term (current) drug therapy: Secondary | ICD-10-CM | POA: Diagnosis not present

## 2020-12-24 DIAGNOSIS — I2510 Atherosclerotic heart disease of native coronary artery without angina pectoris: Secondary | ICD-10-CM

## 2020-12-24 MED ORDER — METOPROLOL SUCCINATE ER 25 MG PO TB24: 25.0000 mg | ORAL_TABLET | Freq: Every day | ORAL | 1 refills | Status: DC

## 2020-12-24 MED ORDER — LOSARTAN POTASSIUM 50 MG PO TABS: 50.0000 mg | ORAL_TABLET | Freq: Every day | ORAL | 12 refills | Status: DC

## 2020-12-24 MED ORDER — AMLODIPINE BESYLATE 2.5 MG PO TABS: 2.5000 mg | ORAL_TABLET | Freq: Every day | ORAL | 12 refills | Status: DC

## 2020-12-24 NOTE — Telephone Encounter (Signed)
-----   Message from Deberah Pelton, NP sent at 12/24/2020  7:13 AM EDT ----- Please contact [name] Brown and let him know that his lab values have been reviewed.  His potassium is elevated at 5.5.  Please ask him to increase his p.o. hydration and return to clinic today for repeat BMP.  We will decrease his losartan back to 50 mg daily and add amlodipine 2.5 mg daily to his medication regimen.  Thank you.

## 2020-12-24 NOTE — Telephone Encounter (Signed)
Advised patient of lab results and medication changes, verbalized understanding

## 2020-12-24 NOTE — Telephone Encounter (Signed)
Patient states he is returning a call. May be regarding his lab results.

## 2020-12-25 LAB — BASIC METABOLIC PANEL
BUN/Creatinine Ratio: 15 (ref 10–24)
BUN: 17 mg/dL (ref 8–27)
CO2: 23 mmol/L (ref 20–29)
Calcium: 9.6 mg/dL (ref 8.6–10.2)
Chloride: 96 mmol/L (ref 96–106)
Creatinine, Ser: 1.1 mg/dL (ref 0.76–1.27)
Glucose: 89 mg/dL (ref 65–99)
Potassium: 4.8 mmol/L (ref 3.5–5.2)
Sodium: 137 mmol/L (ref 134–144)
eGFR: 72 mL/min/{1.73_m2} (ref >59)

## 2020-12-25 MED ORDER — LOSARTAN POTASSIUM 50 MG PO TABS: 75.0000 mg | ORAL_TABLET | Freq: Every day | ORAL | 3 refills | Status: DC

## 2020-12-25 NOTE — Telephone Encounter (Signed)
Spoke to patient -- reviewed results note  from 12/25/20.  -  Deberah Pelton, NP  12/25/2020 8:17 AM EDT      Please contact [name] Brown and let him know that his lab values have been reviewed. His potassium is within normal range at 4.8. We will continue his current medication regimen. No further testing is needed at this time. Thank you.   Patient reviewed result note -  Continue his current medication regimen- change Losartan back 75 mg and do not start amlodipine 2.5 mg.   Patient is aware that new prescription for the Losartan 75 mg ( equal 1.5 tablet)

## 2020-12-28 DIAGNOSIS — Z23 Encounter for immunization: Secondary | ICD-10-CM | POA: Diagnosis not present

## 2020-12-28 MED ORDER — LOSARTAN POTASSIUM 50 MG PO TABS: 75.0000 mg | ORAL_TABLET | Freq: Every day | ORAL | 1 refills | Status: DC

## 2020-12-28 NOTE — Addendum Note (Signed)
Addended by: Caprice Beaver T on: 12/28/2020 07:45 AM   Modules accepted: Orders

## 2020-12-29 ENCOUNTER — Other Ambulatory Visit: Payer: Self-pay

## 2020-12-29 MED ORDER — METOPROLOL SUCCINATE ER 25 MG PO TB24: 25.0000 mg | ORAL_TABLET | Freq: Every day | ORAL | 3 refills | Status: DC

## 2021-01-04 ENCOUNTER — Inpatient Hospital Stay (HOSPITAL_COMMUNITY): Payer: Medicare Other

## 2021-01-04 ENCOUNTER — Encounter (HOSPITAL_BASED_OUTPATIENT_CLINIC_OR_DEPARTMENT_OTHER): Payer: Self-pay | Admitting: *Deleted

## 2021-01-04 ENCOUNTER — Other Ambulatory Visit: Payer: Self-pay

## 2021-01-04 ENCOUNTER — Inpatient Hospital Stay (HOSPITAL_BASED_OUTPATIENT_CLINIC_OR_DEPARTMENT_OTHER)
Admission: EM | Admit: 2021-01-04 | Discharge: 2021-01-06 | DRG: 390 | Disposition: A | Discharge status: Home / Self Care | Payer: Medicare Other | Attending: Internal Medicine | Admitting: Internal Medicine

## 2021-01-04 ENCOUNTER — Emergency Department (HOSPITAL_BASED_OUTPATIENT_CLINIC_OR_DEPARTMENT_OTHER): Payer: Medicare Other

## 2021-01-04 DIAGNOSIS — I493 Ventricular premature depolarization: Secondary | ICD-10-CM | POA: Diagnosis present

## 2021-01-04 DIAGNOSIS — Z7289 Other problems related to lifestyle: Secondary | ICD-10-CM

## 2021-01-04 DIAGNOSIS — I712 Thoracic aortic aneurysm, without rupture: Secondary | ICD-10-CM | POA: Diagnosis present

## 2021-01-04 DIAGNOSIS — Z20822 Contact with and (suspected) exposure to covid-19: Secondary | ICD-10-CM | POA: Diagnosis present

## 2021-01-04 DIAGNOSIS — K56609 Unspecified intestinal obstruction, unspecified as to partial versus complete obstruction: Secondary | ICD-10-CM

## 2021-01-04 DIAGNOSIS — Z7982 Long term (current) use of aspirin: Secondary | ICD-10-CM

## 2021-01-04 DIAGNOSIS — G4733 Obstructive sleep apnea (adult) (pediatric): Secondary | ICD-10-CM | POA: Diagnosis present

## 2021-01-04 DIAGNOSIS — Z8249 Family history of ischemic heart disease and other diseases of the circulatory system: Secondary | ICD-10-CM

## 2021-01-04 DIAGNOSIS — Z789 Other specified health status: Secondary | ICD-10-CM

## 2021-01-04 DIAGNOSIS — Z4682 Encounter for fitting and adjustment of non-vascular catheter: Secondary | ICD-10-CM | POA: Diagnosis not present

## 2021-01-04 DIAGNOSIS — J9811 Atelectasis: Secondary | ICD-10-CM | POA: Diagnosis not present

## 2021-01-04 DIAGNOSIS — I10 Essential (primary) hypertension: Secondary | ICD-10-CM | POA: Diagnosis present

## 2021-01-04 DIAGNOSIS — I480 Paroxysmal atrial fibrillation: Secondary | ICD-10-CM | POA: Diagnosis present

## 2021-01-04 DIAGNOSIS — K565 Intestinal adhesions [bands], unspecified as to partial versus complete obstruction: Secondary | ICD-10-CM | POA: Diagnosis present

## 2021-01-04 DIAGNOSIS — K219 Gastro-esophageal reflux disease without esophagitis: Secondary | ICD-10-CM | POA: Diagnosis present

## 2021-01-04 DIAGNOSIS — Z9989 Dependence on other enabling machines and devices: Secondary | ICD-10-CM | POA: Diagnosis not present

## 2021-01-04 DIAGNOSIS — R109 Unspecified abdominal pain: Secondary | ICD-10-CM | POA: Diagnosis not present

## 2021-01-04 DIAGNOSIS — I714 Abdominal aortic aneurysm, without rupture: Secondary | ICD-10-CM | POA: Diagnosis present

## 2021-01-04 DIAGNOSIS — F1090 Alcohol use, unspecified, uncomplicated: Secondary | ICD-10-CM

## 2021-01-04 DIAGNOSIS — Z952 Presence of prosthetic heart valve: Secondary | ICD-10-CM

## 2021-01-04 DIAGNOSIS — Z79890 Hormone replacement therapy (postmenopausal): Secondary | ICD-10-CM | POA: Diagnosis not present

## 2021-01-04 DIAGNOSIS — H3530 Unspecified macular degeneration: Secondary | ICD-10-CM | POA: Diagnosis present

## 2021-01-04 DIAGNOSIS — K6389 Other specified diseases of intestine: Secondary | ICD-10-CM | POA: Diagnosis not present

## 2021-01-04 DIAGNOSIS — E039 Hypothyroidism, unspecified: Secondary | ICD-10-CM | POA: Diagnosis present

## 2021-01-04 DIAGNOSIS — K5669 Other partial intestinal obstruction: Secondary | ICD-10-CM | POA: Diagnosis not present

## 2021-01-04 DIAGNOSIS — Z85038 Personal history of other malignant neoplasm of large intestine: Secondary | ICD-10-CM

## 2021-01-04 DIAGNOSIS — Z79899 Other long term (current) drug therapy: Secondary | ICD-10-CM | POA: Diagnosis not present

## 2021-01-04 DIAGNOSIS — Z85820 Personal history of malignant melanoma of skin: Secondary | ICD-10-CM

## 2021-01-04 DIAGNOSIS — Z0189 Encounter for other specified special examinations: Secondary | ICD-10-CM

## 2021-01-04 DIAGNOSIS — Z4659 Encounter for fitting and adjustment of other gastrointestinal appliance and device: Secondary | ICD-10-CM

## 2021-01-04 DIAGNOSIS — E785 Hyperlipidemia, unspecified: Secondary | ICD-10-CM | POA: Diagnosis present

## 2021-01-04 LAB — CBC WITH DIFFERENTIAL/PLATELET
Abs Immature Granulocytes: 0.04 10*3/uL (ref 0.00–0.07)
Basophils Absolute: 0 10*3/uL (ref 0.0–0.1)
Basophils Relative: 0 %
Eosinophils Absolute: 0.1 10*3/uL (ref 0.0–0.5)
Eosinophils Relative: 1 %
HCT: 48.9 % (ref 39.0–52.0)
Hemoglobin: 16.8 g/dL (ref 13.0–17.0)
Immature Granulocytes: 0 %
Lymphocytes Relative: 7 %
Lymphs Abs: 0.7 10*3/uL (ref 0.7–4.0)
MCH: 33.2 pg (ref 26.0–34.0)
MCHC: 34.4 g/dL (ref 30.0–36.0)
MCV: 96.6 fL (ref 80.0–100.0)
Monocytes Absolute: 0.7 10*3/uL (ref 0.1–1.0)
Monocytes Relative: 8 %
Neutro Abs: 7.6 10*3/uL (ref 1.7–7.7)
Neutrophils Relative %: 84 %
Platelets: 184 10*3/uL (ref 150–400)
RBC: 5.06 MIL/uL (ref 4.22–5.81)
RDW: 13.3 % (ref 11.5–15.5)
WBC: 9.2 10*3/uL (ref 4.0–10.5)
nRBC: 0 % (ref 0.0–0.2)

## 2021-01-04 LAB — RESP PANEL BY RT-PCR (FLU A&B, COVID) ARPGX2
Influenza A by PCR: NEGATIVE
Influenza B by PCR: NEGATIVE
SARS Coronavirus 2 by RT PCR: NEGATIVE

## 2021-01-04 LAB — COMPREHENSIVE METABOLIC PANEL
ALT: 26 U/L (ref 0–44)
AST: 21 U/L (ref 15–41)
Albumin: 4.3 g/dL (ref 3.5–5.0)
Alkaline Phosphatase: 51 U/L (ref 38–126)
Anion gap: 8 (ref 5–15)
BUN: 15 mg/dL (ref 8–23)
CO2: 29 mmol/L (ref 22–32)
Calcium: 9.6 mg/dL (ref 8.9–10.3)
Chloride: 100 mmol/L (ref 98–111)
Creatinine, Ser: 1.09 mg/dL (ref 0.61–1.24)
GFR, Estimated: >60 mL/min (ref >60)
Glucose, Bld: 126 mg/dL — ABNORMAL HIGH (ref 70–99)
Potassium: 4.6 mmol/L (ref 3.5–5.1)
Sodium: 137 mmol/L (ref 135–145)
Total Bilirubin: 0.7 mg/dL (ref 0.3–1.2)
Total Protein: 6.7 g/dL (ref 6.5–8.1)

## 2021-01-04 LAB — URINALYSIS, ROUTINE W REFLEX MICROSCOPIC
Bilirubin Urine: NEGATIVE
Glucose, UA: NEGATIVE mg/dL
Hgb urine dipstick: NEGATIVE
Ketones, ur: 15 mg/dL — AB
Leukocytes,Ua: NEGATIVE
Nitrite: NEGATIVE
Specific Gravity, Urine: 1.028 (ref 1.005–1.030)
pH: 6 (ref 5.0–8.0)

## 2021-01-04 LAB — LIPASE, BLOOD: Lipase: 10 U/L — ABNORMAL LOW (ref 11–51)

## 2021-01-04 MED ORDER — ONDANSETRON HCL 4 MG/2ML IJ SOLN: 4.0000 mg | Freq: Four times a day (QID) | INTRAMUSCULAR | Status: DC | PRN

## 2021-01-04 MED ORDER — SODIUM CHLORIDE 0.9 % IV SOLN: INTRAVENOUS | Status: DC

## 2021-01-04 MED ORDER — MORPHINE SULFATE (PF) 2 MG/ML IV SOLN
2.0000 mg | Freq: Once | INTRAVENOUS | Status: AC
  Administered 2021-01-04: 2 mg via INTRAVENOUS
  Filled 2021-01-04: qty 1

## 2021-01-04 MED ORDER — SALINE SPRAY 0.65 % NA SOLN
1.0000 | NASAL | Status: DC | PRN
  Administered 2021-01-04 (×2): 1 via NASAL
  Filled 2021-01-04 (×2): qty 44

## 2021-01-04 MED ORDER — LIDOCAINE HCL URETHRAL/MUCOSAL 2 % EX GEL
1.0000 "application " | Freq: Once | CUTANEOUS | Status: AC
  Administered 2021-01-04: 1
  Filled 2021-01-04: qty 11

## 2021-01-04 MED ORDER — THIAMINE HCL 100 MG/ML IJ SOLN
100.0000 mg | Freq: Every day | INTRAMUSCULAR | Status: DC
  Administered 2021-01-04 – 2021-01-06 (×3): 100 mg via INTRAVENOUS
  Filled 2021-01-04 (×3): qty 2

## 2021-01-04 MED ORDER — FAMOTIDINE IN NACL 20-0.9 MG/50ML-% IV SOLN
20.0000 mg | INTRAVENOUS | Status: DC
  Administered 2021-01-04 – 2021-01-06 (×3): 20 mg via INTRAVENOUS
  Filled 2021-01-04 (×3): qty 50

## 2021-01-04 MED ORDER — KETOROLAC TROMETHAMINE 15 MG/ML IJ SOLN
15.0000 mg | Freq: Four times a day (QID) | INTRAMUSCULAR | Status: DC | PRN
  Administered 2021-01-04 – 2021-01-06 (×6): 15 mg via INTRAVENOUS
  Filled 2021-01-04 (×6): qty 1

## 2021-01-04 MED ORDER — ONDANSETRON HCL 4 MG/2ML IJ SOLN
4.0000 mg | Freq: Once | INTRAMUSCULAR | Status: AC
  Administered 2021-01-04: 4 mg via INTRAVENOUS
  Filled 2021-01-04: qty 2

## 2021-01-04 MED ORDER — HYDROMORPHONE HCL 1 MG/ML IJ SOLN
INTRAMUSCULAR | Status: AC
  Filled 2021-01-04: qty 1

## 2021-01-04 MED ORDER — ONDANSETRON HCL 4 MG PO TABS: 4.0000 mg | ORAL_TABLET | Freq: Four times a day (QID) | ORAL | Status: DC | PRN

## 2021-01-04 MED ORDER — ACETAMINOPHEN 650 MG RE SUPP: 650.0000 mg | Freq: Four times a day (QID) | RECTAL | Status: DC | PRN

## 2021-01-04 MED ORDER — ENOXAPARIN SODIUM 40 MG/0.4ML ~~LOC~~ SOLN
40.0000 mg | Freq: Every day | SUBCUTANEOUS | Status: DC
  Administered 2021-01-04 – 2021-01-06 (×3): 40 mg via SUBCUTANEOUS
  Filled 2021-01-04 (×3): qty 0.4

## 2021-01-04 MED ORDER — THIAMINE HCL 100 MG PO TABS: 100.0000 mg | ORAL_TABLET | Freq: Every day | ORAL | Status: DC

## 2021-01-04 MED ORDER — ACETAMINOPHEN 325 MG PO TABS: 650.0000 mg | ORAL_TABLET | Freq: Four times a day (QID) | ORAL | Status: DC | PRN

## 2021-01-04 MED ORDER — HYDROMORPHONE HCL 1 MG/ML IJ SOLN
0.5000 mg | Freq: Once | INTRAMUSCULAR | Status: AC
  Administered 2021-01-04: 0.5 mg via INTRAVENOUS
  Filled 2021-01-04: qty 1

## 2021-01-04 MED ORDER — SODIUM CHLORIDE 0.9% FLUSH
3.0000 mL | Freq: Two times a day (BID) | INTRAVENOUS | Status: DC
  Administered 2021-01-04 – 2021-01-06 (×3): 3 mL via INTRAVENOUS

## 2021-01-04 MED ORDER — LORAZEPAM 2 MG/ML IJ SOLN
1.0000 mg | INTRAMUSCULAR | Status: DC | PRN
  Administered 2021-01-05: 1 mg via INTRAVENOUS
  Filled 2021-01-04: qty 1

## 2021-01-04 MED ORDER — HYDROMORPHONE HCL 1 MG/ML IJ SOLN
0.5000 mg | INTRAMUSCULAR | Status: DC | PRN
  Administered 2021-01-04 – 2021-01-06 (×3): 0.5 mg via INTRAVENOUS
  Filled 2021-01-04 (×3): qty 1

## 2021-01-04 MED ORDER — HYDROMORPHONE HCL 1 MG/ML IJ SOLN
1.0000 mg | Freq: Once | INTRAMUSCULAR | Status: AC
Start: 2021-01-04 — End: 2021-01-04
  Administered 2021-01-04: 1 mg via INTRAVENOUS

## 2021-01-04 MED ORDER — DIPHENHYDRAMINE HCL 50 MG/ML IJ SOLN
25.0000 mg | Freq: Once | INTRAMUSCULAR | Status: AC
  Administered 2021-01-04: 25 mg via INTRAVENOUS
  Filled 2021-01-04: qty 1

## 2021-01-04 MED ORDER — DIPHENHYDRAMINE HCL 25 MG PO CAPS: 25.0000 mg | ORAL_CAPSULE | Freq: Once | ORAL | Status: DC

## 2021-01-04 MED ORDER — LORAZEPAM 1 MG PO TABS
1.0000 mg | ORAL_TABLET | ORAL | Status: DC | PRN
Start: 2021-01-04 — End: 2021-01-06

## 2021-01-04 MED ORDER — METOPROLOL TARTRATE 5 MG/5ML IV SOLN
2.5000 mg | Freq: Three times a day (TID) | INTRAVENOUS | Status: DC
  Administered 2021-01-04 – 2021-01-05 (×4): 2.5 mg via INTRAVENOUS
  Filled 2021-01-04 (×4): qty 5

## 2021-01-04 MED ORDER — PREDNISOLONE ACETATE 1 % OP SUSP
1.0000 [drp] | Freq: Four times a day (QID) | OPHTHALMIC | Status: DC
  Administered 2021-01-04 – 2021-01-06 (×8): 1 [drp] via OPHTHALMIC

## 2021-01-04 MED ORDER — IOHEXOL 300 MG/ML  SOLN
80.0000 mL | Freq: Once | INTRAMUSCULAR | Status: AC | PRN
  Administered 2021-01-04: 80 mL via INTRAVENOUS

## 2021-01-04 MED ORDER — KETOROLAC TROMETHAMINE 0.5 % OP SOLN
1.0000 [drp] | Freq: Four times a day (QID) | OPHTHALMIC | Status: DC
  Administered 2021-01-04 – 2021-01-06 (×8): 1 [drp] via OPHTHALMIC

## 2021-01-04 MED ORDER — ALUM & MAG HYDROXIDE-SIMETH 200-200-20 MG/5ML PO SUSP
30.0000 mL | Freq: Once | ORAL | Status: AC
  Administered 2021-01-04: 30 mL via ORAL
  Filled 2021-01-04: qty 30

## 2021-01-04 MED ORDER — ALBUTEROL SULFATE (2.5 MG/3ML) 0.083% IN NEBU: 2.5000 mg | INHALATION_SOLUTION | Freq: Four times a day (QID) | RESPIRATORY_TRACT | Status: DC | PRN

## 2021-01-04 MED ORDER — DIATRIZOATE MEGLUMINE & SODIUM 66-10 % PO SOLN
90.0000 mL | Freq: Once | ORAL | Status: AC
  Administered 2021-01-04: 90 mL via NASOGASTRIC
  Filled 2021-01-04: qty 90

## 2021-01-04 NOTE — Consult Note (Signed)
Consult Note  [name] Brown [birth year]  [id number].    Requesting MD: Dr. Lajean Saver  Chief Complaint/Reason for Consult: SBO HPI:  [name] Brown is a 70yo male PMH HTN, HLD, atrial fibrillation not on anticoagulation, hypothyroidism, AAA, OSA  who was transferred from [place] to [facility] for SBO. Prior abdominal surgeries include Perforated sigmoid from stercoral perforation s/p exploratory laparotomy, sigmoid colectomy, descending colostomy 04/26/2016 by Dr. Harlow Asa; Colon cancer s/p right colectomy and colostomy closure 08/19/2016 by Dr. Harlow Asa.  States that he developed intermittent RUQ pain about 3 days ago. He thought the pain was getting better until last night when it became more severe. Pain is now a little more diffuse but still worst in RLQ. Last BM was 2 days ago and he has passed no flatus today. Reports last stool was very hard and this is typical of his BMs. Denies nausea, vomiting, diarrhea, fever, chills.   In the ED patient was found to be hypertensive, otherwise vital signs stable. Lab work fairly unremarkable with WBC, creatinine, and electrolytes WNL. CT abdomen/pelvis shows moderate to severe small bowel obstruction with a discrete transition point in the anterior right abdomen not far from the small to large bowel anastomosis, favor adhesions; small volume of free fluid in the abdomen and pelvis appears reactive; no free air.  Patient was admitted to the medical service. General surgery asked to see for SBO.  -Nonsmoker -Denies illicit drug use -Drinks 2-3 glasses of wine daily -Employment: retired   ROS: Review of Systems  Constitutional: Negative for chills and fever.  Respiratory: Negative for shortness of breath and wheezing.   Cardiovascular: Negative for chest pain and palpitations.  Gastrointestinal: Positive for abdominal pain, constipation and nausea. Negative for blood in stool, melena and vomiting.  Genitourinary: Negative for dysuria,  frequency and urgency.  All other systems reviewed and are negative.   Family History  Problem Relation Age of Onset  . Dementia Mother   . Kidney disease Mother   . Vascular Disease Mother   . CAD Mother   . Heart attack Father   . Alcohol abuse Father   . Cirrhosis Father   . Stroke Maternal Grandmother   . Stroke Paternal Grandmother     Past Medical History:  Diagnosis Date  . Aortic stenosis   . Ascending aortic aneurysm ([place]) 09/18/2018  . Colon cancer ([place])   . Family history of adverse reaction to anesthesia    pt states his mother had 2 episodes of hyponatremia following anesthesia   . GERD (gastroesophageal reflux disease)   . Headache   . Heart murmur   . Hyperlipidemia 10/25/2016  . Hypertension    pt is currently not taking any medications; pt states is borderline  . Melanoma ([place])   . OSA on CPAP 09/18/2018  . PAF (paroxysmal atrial fibrillation) ([place]) 06/13/2017  . Perforated sigmoid colon ([place])   . PVC's (premature ventricular contractions) 10/25/2016  . Tinnitus   . Wears glasses     Past Surgical History:  Procedure Laterality Date  . AORTIC VALVE REPLACEMENT N/A 06/08/2017   Procedure: AORTIC VALVE REPLACEMENT (AVR);  Surgeon: [name], MD;  Location: [facility];  Service: Open Heart Surgery;  Laterality: N/A;  Using 39mm Perimount Magna Ease Pericardial Bioprosthesis Aortic Valve  . COLOSTOMY CLOSURE N/A 08/18/2016   Procedure: COLOSTOMY CLOSURE OPEN PROCEDURE;  Surgeon: Armandina Gemma, MD;  Location: [facility];  Service: General;  Laterality: N/A;  . LAPAROTOMY N/A 04/25/2016  Procedure: EXPLORATORY LAPAROTOMY WITH SIGMOID COLECTOMY, COLOSTOMY;  Surgeon: Armandina Gemma, MD;  Location: [facility];  Service: General;  Laterality: N/A;  . LEFT HEART CATH AND CORONARY ANGIOGRAPHY N/A 11/03/2016   Procedure: Left Heart Cath and Coronary Angiography;  Surgeon: Burnell Blanks, MD;  Location: [place] [facility];  Service: Cardiovascular;  Laterality: N/A;  . MELANOMA  EXCISION    . PARTIAL COLECTOMY Right 08/18/2016   Procedure: RIGHT COLECTOMY;  Surgeon: Armandina Gemma, MD;  Location: [facility];  Service: General;  Laterality: Right;  . TEE WITHOUT CARDIOVERSION N/A 06/08/2017   Procedure: TRANSESOPHAGEAL ECHOCARDIOGRAM (TEE);  Surgeon: [name], MD;  Location: [place];  Service: Open Heart Surgery;  Laterality: N/A;    Social History:  reports that he has never smoked. He has never used smokeless tobacco. He reports current alcohol use of about 21.0 - 24.0 standard drinks of alcohol per week. He reports that he does not use drugs.  Allergies: No Known Allergies  Medications Prior to Admission  Medication Sig Dispense Refill  . aspirin EC 81 MG tablet Take 81 mg by mouth daily.    [name] ezetimibe (ZETIA) 10 MG tablet TAKE ONE TABLET BY MOUTH DAILY 90 tablet 0  . famotidine (PEPCID) 20 MG tablet Take 20 mg by mouth daily as needed for heartburn or indigestion.    [name] ketorolac (ACULAR) 0.5 % ophthalmic solution Place 1 drop into both eyes 4 (four) times daily.    [name] levothyroxine (SYNTHROID) 50 MCG tablet Take 1 tablet (50 mcg total) by mouth daily. (Patient taking differently: Take 50 mcg by mouth daily before breakfast.) 90 tablet 1  . losartan (COZAAR) 50 MG tablet Take 1.5 tablets (75 mg total) by mouth daily. 135 tablet 1  . metoprolol succinate (TOPROL-XL) 25 MG 24 hr tablet Take 1 tablet (25 mg total) by mouth daily. 90 tablet 3  . pravastatin (PRAVACHOL) 20 MG tablet Take 1 tablet (20 mg total) by mouth daily. 90 tablet 3  . prednisoLONE acetate (PRED FORTE) 1 % ophthalmic suspension Place 1 drop into both eyes 4 (four) times daily.    [name] rOPINIRole (REQUIP) 0.25 MG tablet Take 3 tablets (0.75 mg total) by mouth at bedtime. 270 tablet 3  . tadalafil (CIALIS) 20 MG tablet Take 20 mg by mouth daily as needed for erectile dysfunction.      Blood pressure (!) 166/96, pulse 73, temperature 97.9 F (36.6 C), resp. rate 14, height 5\' 5"  (1.651 m), weight 76.7 kg,  SpO2 93 %. Physical Exam:  General: pleasant, WD, WN male who is laying in bed in NAD HEENT: head is normocephalic, atraumatic.  Sclera are noninjected.  PERRL.  Ears and nose without any masses or lesions.  Mouth is pink and moist Heart: regular, rate, and rhythm.  Normal s1,s2. No obvious murmurs, gallops, or rubs noted.  Palpable radial and pedal pulses bilaterally Lungs: CTAB, no wheezes, rhonchi, or rales noted.  Respiratory effort nonlabored Abd: soft, mildly ttp in RLQ without peritonitis, distended, hypoactive BS, midline surgical scar well healed, prior LLQ colostomy site well healed  MS: all 4 extremities are symmetrical with no cyanosis, clubbing, or edema. Skin: warm and dry with no masses, lesions, or rashes Neuro: Cranial nerves 2-12 grossly intact, sensation is normal throughout Psych: A&Ox3 with an appropriate affect.   Results for orders placed or performed during the hospital encounter of 01/04/21 (from the past 48 hour(s))  CBC with Differential     Status: None  Collection Time: 01/04/21  5:26 AM  Result Value Ref Range   WBC 9.2 4.0 - 10.5 K/uL   RBC 5.06 4.22 - 5.81 MIL/uL   Hemoglobin 16.8 13.0 - 17.0 g/dL   HCT 48.9 39.0 - 52.0 %   MCV 96.6 80.0 - 100.0 fL   MCH 33.2 26.0 - 34.0 pg   MCHC 34.4 30.0 - 36.0 g/dL   RDW 13.3 11.5 - 15.5 %   Platelets 184 150 - 400 K/uL   nRBC 0.0 0.0 - 0.2 %   Neutrophils Relative % 84 %   Neutro Abs 7.6 1.7 - 7.7 K/uL   Lymphocytes Relative 7 %   Lymphs Abs 0.7 0.7 - 4.0 K/uL   Monocytes Relative 8 %   Monocytes Absolute 0.7 0.1 - 1.0 K/uL   Eosinophils Relative 1 %   Eosinophils Absolute 0.1 0.0 - 0.5 K/uL   Basophils Relative 0 %   Basophils Absolute 0.0 0.0 - 0.1 K/uL   Immature Granulocytes 0 %   Abs Immature Granulocytes 0.04 0.00 - 0.07 K/uL    Comment: Performed at [facility]  Comprehensive metabolic panel     Status: Abnormal   Collection Time: 01/04/21  5:26 AM  Result Value Ref Range    Sodium 137 135 - 145 mmol/L   Potassium 4.6 3.5 - 5.1 mmol/L   Chloride 100 98 - 111 mmol/L   CO2 29 22 - 32 mmol/L   Glucose, Bld 126 (H) 70 - 99 mg/dL    Comment: Glucose reference range applies only to samples taken after fasting for at least 8 hours.   BUN 15 8 - 23 mg/dL   Creatinine, Ser 1.09 0.61 - 1.24 mg/dL   Calcium 9.6 8.9 - 10.3 mg/dL   Total Protein 6.7 6.5 - 8.1 g/dL   Albumin 4.3 3.5 - 5.0 g/dL   AST 21 15 - 41 U/L   ALT 26 0 - 44 U/L   Alkaline Phosphatase 51 38 - 126 U/L   Total Bilirubin 0.7 0.3 - 1.2 mg/dL   GFR, Estimated >60 >60 mL/min    Comment: (NOTE) Calculated using the CKD-EPI Creatinine Equation (2021)    Anion gap 8 5 - 15    Comment: Performed at [facility]  Lipase, blood     Status: Abnormal   Collection Time: 01/04/21  5:26 AM  Result Value Ref Range   Lipase 10 (L) 11 - 51 U/L    Comment: Performed at [facility]  Urinalysis, Routine w reflex microscopic     Status: Abnormal   Collection Time: 01/04/21  5:33 AM  Result Value Ref Range   Color, Urine YELLOW YELLOW   APPearance CLEAR CLEAR   Specific Gravity, Urine 1.028 1.005 - 1.030   pH 6.0 5.0 - 8.0   Glucose, UA NEGATIVE NEGATIVE mg/dL   Hgb urine dipstick NEGATIVE NEGATIVE   Bilirubin Urine NEGATIVE NEGATIVE   Ketones, ur 15 (A) NEGATIVE mg/dL   Protein, ur TRACE (A) NEGATIVE mg/dL   Nitrite NEGATIVE NEGATIVE   Leukocytes,Ua NEGATIVE NEGATIVE    Comment: Performed at [facility]  Resp Panel by RT-PCR (Flu A&B, Covid) Nasopharyngeal Swab     Status: None   Collection Time: 01/04/21  8:42 AM   Specimen: Nasopharyngeal Swab; Nasopharyngeal(NP) swabs in vial transport medium  Result Value Ref Range   SARS Coronavirus 2 by RT PCR NEGATIVE NEGATIVE    Comment: (NOTE) SARS-CoV-2 target nucleic acids  are NOT DETECTED.  The SARS-CoV-2 RNA is generally detectable in upper respiratory specimens during the acute phase of infection. The  lowest concentration of SARS-CoV-2 viral copies this assay can detect is 138 copies/mL. A negative result does not preclude SARS-Cov-2 infection and should not be used as the sole basis for treatment or other patient management decisions. A negative result may occur with  improper specimen collection/handling, submission of specimen other than nasopharyngeal swab, presence of viral mutation(s) within the areas targeted by this assay, and inadequate number of viral copies(<138 copies/mL). A negative result must be combined with clinical observations, patient history, and epidemiological information. The expected result is Negative.  Fact Sheet for Patients:  EntrepreneurPulse.com.au  Fact Sheet for Healthcare Providers:  IncredibleEmployment.be  This test is no t yet approved or cleared by the [place] FDA and  has been authorized for detection and/or diagnosis of SARS-CoV-2 by FDA under an Emergency Use Authorization (EUA). This EUA will remain  in effect (meaning this test can be used) for the duration of the COVID-19 declaration under Section 564(b)(1) of the Act, 21 U.S.C.section 360bbb-3(b)(1), unless the authorization is terminated  or revoked sooner.       Influenza A by PCR NEGATIVE NEGATIVE   Influenza B by PCR NEGATIVE NEGATIVE    Comment: (NOTE) The Xpert Xpress SARS-CoV-2/FLU/RSV plus assay is intended as an aid in the diagnosis of influenza from Nasopharyngeal swab specimens and should not be used as a sole basis for treatment. Nasal washings and aspirates are unacceptable for Xpert Xpress SARS-CoV-2/FLU/RSV testing.  Fact Sheet for Patients: EntrepreneurPulse.com.au  Fact Sheet for Healthcare Providers: IncredibleEmployment.be  This test is not yet approved or cleared by the [place] FDA and has been authorized for detection and/or diagnosis of SARS-CoV-2 by FDA under an Emergency  Use Authorization (EUA). This EUA will remain in effect (meaning this test can be used) for the duration of the COVID-19 declaration under Section 564(b)(1) of the Act, 21 U.S.C. section 360bbb-3(b)(1), unless the authorization is terminated or revoked.  Performed at [facility]    CT ABDOMEN PELVIS W CONTRAST  Result Date: 01/04/2021 CLINICAL DATA:  70 year old male with abdominal pain on set overnight. History of melanoma and colon cancer. EXAM: CT ABDOMEN AND PELVIS WITH CONTRAST TECHNIQUE: Multidetector CT imaging of the abdomen and pelvis was performed using the standard protocol following bolus administration of intravenous contrast. CONTRAST:  70mL OMNIPAQUE IOHEXOL 300 MG/ML  SOLN COMPARISON:  CTA chest 05/06/2019. CT Abdomen and Pelvis 04/25/2016, 12/23/2018. FINDINGS: Lower chest: Lower lung volumes with bilateral lower lobe atelectasis superimposed on some scarring along the left major fissure. No pericardial or pleural effusion. Hepatobiliary: Small volume of perihepatic free fluid with simple fluid density. Liver enhancement is stable since 2017 with small benign caudate cysts suspected on series 2, image 16. Cholelithiasis layering on series 2, image 24. No pericholecystic inflammation or bile duct enlargement. Pancreas: Atrophied. Spleen: Negative aside from a small volume of simple density perisplenic fluid. Adrenals/Urinary Tract: Normal adrenal glands. Chronic right renal scarring. Nonobstructed kidneys with symmetric contrast excretion. Decompressed ureters. Decompressed urinary bladder. Stomach/Bowel: Decompressed rectum. Sigmoid colon anastomosis on series 2, image 59 with no adverse features. Decompressed descending colon. Redundant splenic flexure, transverse colon with retained gas and stool. There is a small to large bowel anastomosis in the right abdomen (coronal image 52) with decompressed distal small bowel. However, there is an abrupt transition point in the  right anterior abdomen on series 2, image 44  and coronal image 71 with upstream dilated small bowel with flocculated content (small bowel feces sign). Proximal to that fluid-filled small bowel loops are dilated up to 35 mm diameter throughout the mid jejunum. Distal small bowel is decompressed. Proximal small bowel loops are fluid-filled but have a more normal appearance. The stomach is mildly distended with fluid. The duodenum is decompressed. No free air. Vascular/Lymphatic: Mild Aortoiliac calcified atherosclerosis. Major arterial structures remain patent. Portal venous system appears patent. No lymphadenopathy. Reproductive: Negative. Other: Small volume pelvic free fluid with simple fluid density appears to be tracking from the distal small bowel mesentery. Chronic postoperative changes to the ventral abdominal wall. Musculoskeletal: Stable mild L5 superior endplate compression. No acute or suspicious osseous lesion identified. IMPRESSION: 1. Small Bowel Obstruction is moderate to severe with a discrete transition point in the anterior right abdomen not far from the small to large bowel anastomosis. Favor adhesions. Small volume of free fluid in the abdomen and pelvis appears reactive. No free air. 2. Cholelithiasis without CT evidence of acute cholecystitis. Lung base atelectasis and scarring. Electronically Signed   By: Genevie Ann M.D.   On: 01/04/2021 07:46   DG Chest Port 1 View  Result Date: 01/04/2021 CLINICAL DATA:  NG tube placement. EXAM: PORTABLE CHEST 1 VIEW COMPARISON:  July 29, 2017. FINDINGS: NG tube courses below the diaphragm and is looped in the expected region of the stomach. Linear left basilar opacities. No visible pleural effusions or pneumothorax on these portable AP radiographs. Similar cardiomediastinal silhouette and aortic valve replacement with median sternotomy. Polyarticular degenerative change. IMPRESSION: 1. NG tube courses below the diaphragm and is looped in the expected  region of the stomach. 2. Linear left basilar opacities, favor atelectasis. 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 We will follow   FEN: NPO, IVF, NGT to LIWS VTE: SCDs, ok to have lovenox or SQ heparin  ID: no current abx  Norm Parcel, [facility] Surgery 01/04/2021, 12:16 PM Please see Amion for pager number during day hours 7:00am-4:30pm

## 2021-01-04 NOTE — ED Triage Notes (Addendum)
Pt c/o abd pain that started earlier this evening. States he had one episode of abd pain on Friday night. States pain is constant. Denies any n/v. Denies any diarrhea or constipation. Has not taken anything for pain other than bentyl. Describes pain as an aching.  Denies any fevers.

## 2021-01-04 NOTE — H&P (Addendum)
History and Physical    [name] Brown JAS:[id number] DOB: 06/16/1951 DOA: 01/04/2021  Referring MD/NP/PA: Lajean Saver, MD PCP: [name], MD  Patient coming from: [place] via EMS  Chief Complaint: Abdominal pain  I have personally briefly reviewed patient's old medical records in [place]   HPI: [name] Brown is a 70 y.o. male with medical history significant of hypertension, hyperlipidemia, A. fib following AVR, PVCs, hypothyroidism, AAA, bowel perforation requiring sigmoidectomy, and colon cancer s/p right right hemicolectomy, OSA on CPAP presents with complaints of right upper quadrant abdominal pain.  He had first experienced symptoms approximately 3 days ago with extreme pain in the right upper quadrant of his abdomen that waxed and waned.  Symptoms seem to self resolve by the morning the following day.  He had lightly the following day and was doing well until last night when he abruptly felt the pain again.  He reports that he has not been able to pass flatus since last night and his last bowel movement was 2 days ago.  Denies having any nausea, vomiting, cough, shortness of breath, fever, chills, dysuria, or leg swelling symptoms.  He reports that he previously had suffered a perforated sigmoid colon in 04/2016 requiring exploratory laparotomy with sigmoid colectomy and descending colostomy.  He subsequently underwent a colonoscopy with Dr. 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  ED Course: Upon admission into the emergency department patient was seen to be afebrile with vital signs 121/107-166/96. all other vital signs maintained.  Labs were relatively unremarkable.  Urinalysis positive for ketones and trace protein.  CT of the abdomen pelvis with contrast significant for moderate to severe small bowel obstruction with transition point in the  anterior right abdomen near small to large bowel anastomosis for which adhesions was favored. Patient was given Zofran, morphine, diluadid, and zofran.  Review of Systems  Constitutional: Positive for malaise/fatigue. Negative for fever.  HENT: Negative for congestion and ear discharge.   Eyes: Negative for pain.  Respiratory: Negative for cough and shortness of breath.   Cardiovascular: Negative for chest pain and leg swelling.  Gastrointestinal: Positive for abdominal pain.  Genitourinary: Negative for dysuria and hematuria.  Musculoskeletal: Negative for falls.  Skin: Negative for rash.  Neurological: Negative for focal weakness and loss of consciousness.  Endo/Heme/Allergies: Negative for polydipsia.  Psychiatric/Behavioral: Positive for substance abuse. Negative for memory loss.    Past Medical History:  Diagnosis Date  . Aortic stenosis   . Ascending aortic aneurysm ([place]) 09/18/2018  . Colon cancer ([place])   . Family history of adverse reaction to anesthesia    pt states his mother had 2 episodes of hyponatremia following anesthesia   . GERD (gastroesophageal reflux disease)   . Headache   . Heart murmur   . Hyperlipidemia 10/25/2016  . Hypertension    pt is currently not taking any medications; pt states is borderline  . Melanoma ([place])   . OSA on CPAP 09/18/2018  . PAF (paroxysmal atrial fibrillation) ([place]) 06/13/2017  . Perforated sigmoid colon ([place])   . PVC's (premature ventricular contractions) 10/25/2016  . Tinnitus   . Wears glasses     Past Surgical History:  Procedure Laterality Date  . AORTIC VALVE REPLACEMENT N/A 06/08/2017   Procedure: AORTIC VALVE REPLACEMENT (AVR);  Surgeon: [name], MD;  Location: [facility];  Service: Open Heart Surgery;  Laterality: N/A;  Using 61mm Perimount Magna Ease Pericardial Bioprosthesis Aortic Valve  . COLOSTOMY CLOSURE N/A 08/18/2016   Procedure: COLOSTOMY CLOSURE OPEN PROCEDURE;  Surgeon: Armandina Gemma, MD;  Location: [facility];   Service: General;  Laterality: N/A;  . LAPAROTOMY N/A 04/25/2016   Procedure: EXPLORATORY LAPAROTOMY WITH SIGMOID COLECTOMY, COLOSTOMY;  Surgeon: Armandina Gemma, MD;  Location: [facility];  Service: General;  Laterality: N/A;  . LEFT HEART CATH AND CORONARY ANGIOGRAPHY N/A 11/03/2016   Procedure: Left Heart Cath and Coronary Angiography;  Surgeon: Burnell Blanks, MD;  Location: [facility];  Service: Cardiovascular;  Laterality: N/A;  . MELANOMA EXCISION    . PARTIAL COLECTOMY Right 08/18/2016   Procedure: RIGHT COLECTOMY;  Surgeon: Armandina Gemma, MD;  Location: [facility];  Service: General;  Laterality: Right;  . TEE WITHOUT CARDIOVERSION N/A 06/08/2017   Procedure: TRANSESOPHAGEAL ECHOCARDIOGRAM (TEE);  Surgeon: [name], MD;  Location: [facility];  Service: Open Heart Surgery;  Laterality: N/A;     reports that he has never smoked. He has never used smokeless tobacco. He reports current alcohol use of about 21.0 - 24.0 standard drinks of alcohol per week. He reports that he does not use drugs.  No Known Allergies  Family History  Problem Relation Age of Onset  . Dementia Mother   . Kidney disease Mother   . Vascular Disease Mother   . CAD Mother   . Heart attack Father   . Alcohol abuse Father   . Cirrhosis Father   . Stroke Maternal Grandmother   . Stroke Paternal Grandmother     Prior to Admission medications   Medication Sig Start Date End Date Taking? Authorizing Provider  aspirin EC 81 MG tablet Take 81 mg by mouth daily.    [provider]  ezetimibe (ZETIA) 10 MG tablet TAKE ONE TABLET BY MOUTH DAILY 09/01/20   Skeet Latch, MD  famotidine (PEPCID) 20 MG tablet Take 20 mg by mouth daily as needed for heartburn or indigestion.    [provider]  ketorolac (ACULAR) 0.5 % ophthalmic solution SMARTSIG:In Eye(s) 11/20/20   [provider]  levothyroxine (SYNTHROID) 50 MCG tablet Take 1 tablet (50 mcg total) by mouth daily. 11/17/20   [name], MD  losartan (COZAAR) 50 MG tablet Take 1.5 tablets (75 mg total) by mouth daily. 12/28/20 03/28/21  Deberah Pelton, NP  metoprolol succinate (TOPROL-XL) 25 MG 24 hr tablet Take 1 tablet (25 mg total) by mouth daily. 12/29/20   Skeet Latch, MD  pravastatin (PRAVACHOL) 20 MG tablet Take 1 tablet (20 mg total) by mouth daily. 07/02/20   Skeet Latch, MD  prednisoLONE acetate (PRED FORTE) 1 % ophthalmic suspension  10/15/20   [provider]  rOPINIRole (REQUIP) 0.25 MG tablet Take 3 tablets (0.75 mg total) by mouth at bedtime. 09/22/20   Star Age, MD  tadalafil (CIALIS) 20 MG tablet Take 20 mg by mouth daily as needed for erectile dysfunction.    [provider]    Physical Exam:  Constitutional: Elderly male currently in no acute distress Vitals:   01/04/21 0733 01/04/21 0830 01/04/21 0930 01/04/21 1041  BP: 133/77 (!) 121/107 138/82 (!) 166/96  Pulse: 84 78 79 73  Resp: 15 16  14   Temp:    97.9 F (36.6 C)  TempSrc:      SpO2: 97% 98% 99% 93%  Weight:      Height:       Eyes: PERRL, lids and conjunctivae  normal ENMT: Mucous membranes are moist. Posterior pharynx clear of any exudate or lesions.NGT in place to suction Neck: normal, supple, no masses, no thyromegaly Respiratory: clear to auscultation bilaterally, no wheezing, no crackles. Normal respiratory effort. No accessory muscle use.  Cardiovascular: Regular rate and rhythm, no murmurs / rubs / gallops. No extremity edema. 2+ pedal pulses. No carotid bruits.  Abdomen: Palpation of the right upper quadrant.  Decreased bowel sounds appreciated. Musculoskeletal: no clubbing / cyanosis. No joint deformity upper and lower extremities. Good ROM, no contractures. Normal muscle tone.  Skin: no rashes, lesions, ulcers. No induration Neurologic: CN 2-12 grossly intact. Sensation intact, DTR normal. Strength 5/5 in all 4.  Psychiatric: Normal judgment and insight. Alert and oriented x 3. Normal  mood.     Labs on Admission: I have personally reviewed following labs and imaging studies  CBC: Recent Labs  Lab 01/04/21 0526  WBC 9.2  NEUTROABS 7.6  HGB 16.8  HCT 48.9  MCV 96.6  PLT [id number]   Basic Metabolic Panel: Recent Labs  Lab 01/04/21 0526  NA 137  K 4.6  CL 100  CO2 29  GLUCOSE 126*  BUN 15  CREATININE 1.09  CALCIUM 9.6   GFR: Estimated Creatinine Clearance: 60.3 mL/min (by C-G formula based on SCr of 1.09 mg/dL). Liver Function Tests: Recent Labs  Lab 01/04/21 0526  AST 21  ALT 26  ALKPHOS 51  BILITOT 0.7  PROT 6.7  ALBUMIN 4.3   Recent Labs  Lab 01/04/21 0526  LIPASE 10*   No results for input(s): AMMONIA in the last 168 hours. Coagulation Profile: No results for input(s): INR, PROTIME in the last 168 hours. Cardiac Enzymes: No results for input(s): CKTOTAL, CKMB, CKMBINDEX, TROPONINI in the last 168 hours. BNP (last 3 results) No results for input(s): PROBNP in the last 8760 hours. HbA1C: No results for input(s): HGBA1C in the last 72 hours. CBG: No results for input(s): GLUCAP in the last 168 hours. Lipid Profile: No results for input(s): CHOL, HDL, LDLCALC, TRIG, CHOLHDL, LDLDIRECT in the last 72 hours. Thyroid Function Tests: No results for input(s): TSH, T4TOTAL, FREET4, T3FREE, THYROIDAB in the last 72 hours. Anemia Panel: No results for input(s): VITAMINB12, FOLATE, FERRITIN, TIBC, IRON, RETICCTPCT in the last 72 hours. Urine analysis:    Component Value Date/Time   COLORURINE YELLOW 01/04/2021 0533   APPEARANCEUR CLEAR 01/04/2021 0533   LABSPEC 1.028 01/04/2021 0533   PHURINE 6.0 01/04/2021 0533   GLUCOSEU NEGATIVE 01/04/2021 0533   HGBUR NEGATIVE 01/04/2021 0533   BILIRUBINUR NEGATIVE 01/04/2021 0533   KETONESUR 15 (A) 01/04/2021 0533   PROTEINUR TRACE (A) 01/04/2021 0533   NITRITE NEGATIVE 01/04/2021 0533   LEUKOCYTESUR NEGATIVE 01/04/2021 0533   Sepsis Labs: Recent Results (from the past 240 hour(s))  Resp Panel by  RT-PCR (Flu A&B, Covid) Nasopharyngeal Swab     Status: None   Collection Time: 01/04/21  8:42 AM   Specimen: Nasopharyngeal Swab; Nasopharyngeal(NP) swabs in vial transport medium  Result Value Ref Range Status   SARS Coronavirus 2 by RT PCR NEGATIVE NEGATIVE Final    Comment: (NOTE) SARS-CoV-2 target nucleic acids are NOT DETECTED.  The SARS-CoV-2 RNA is generally detectable in upper respiratory specimens during the acute phase of infection. The lowest concentration of SARS-CoV-2 viral copies this assay can detect is 138 copies/mL. A negative result does not preclude SARS-Cov-2 infection and should not be used as the sole basis for treatment or other patient management decisions. A negative result may  occur with  improper specimen collection/handling, submission of specimen other than nasopharyngeal swab, presence of viral mutation(s) within the areas targeted by this assay, and inadequate number of viral copies(<138 copies/mL). A negative result must be combined with clinical observations, patient history, and epidemiological information. The expected result is Negative.  Fact Sheet for Patients:  EntrepreneurPulse.com.au  Fact Sheet for Healthcare Providers:  IncredibleEmployment.be  This test is no t yet approved or cleared by the [place] FDA and  has been authorized for detection and/or diagnosis of SARS-CoV-2 by FDA under an Emergency Use Authorization (EUA). This EUA will remain  in effect (meaning this test can be used) for the duration of the COVID-19 declaration under Section 564(b)(1) of the Act, 21 U.S.C.section 360bbb-3(b)(1), unless the authorization is terminated  or revoked sooner.       Influenza A by PCR NEGATIVE NEGATIVE Final   Influenza B by PCR NEGATIVE NEGATIVE Final    Comment: (NOTE) The Xpert Xpress SARS-CoV-2/FLU/RSV plus assay is intended as an aid in the diagnosis of influenza from Nasopharyngeal swab  specimens and should not be used as a sole basis for treatment. Nasal washings and aspirates are unacceptable for Xpert Xpress SARS-CoV-2/FLU/RSV testing.  Fact Sheet for Patients: EntrepreneurPulse.com.au  Fact Sheet for Healthcare Providers: IncredibleEmployment.be  This test is not yet approved or cleared by the [place] FDA and has been authorized for detection and/or diagnosis of SARS-CoV-2 by FDA under an Emergency Use Authorization (EUA). This EUA will remain in effect (meaning this test can be used) for the duration of the COVID-19 declaration under Section 564(b)(1) of the Act, 21 U.S.C. section 360bbb-3(b)(1), unless the authorization is terminated or revoked.  Performed at [facility]      Radiological Exams on Admission: CT ABDOMEN PELVIS W CONTRAST  Result Date: 01/04/2021 CLINICAL DATA:  70 year old male with abdominal pain on set overnight. History of melanoma and colon cancer. EXAM: CT ABDOMEN AND PELVIS WITH CONTRAST TECHNIQUE: Multidetector CT imaging of the abdomen and pelvis was performed using the standard protocol following bolus administration of intravenous contrast. CONTRAST:  51mL OMNIPAQUE IOHEXOL 300 MG/ML  SOLN COMPARISON:  CTA chest 05/06/2019. CT Abdomen and Pelvis 04/25/2016, 12/23/2018. FINDINGS: Lower chest: Lower lung volumes with bilateral lower lobe atelectasis superimposed on some scarring along the left major fissure. No pericardial or pleural effusion. Hepatobiliary: Small volume of perihepatic free fluid with simple fluid density. Liver enhancement is stable since 2017 with small benign caudate cysts suspected on series 2, image 16. Cholelithiasis layering on series 2, image 24. No pericholecystic inflammation or bile duct enlargement. Pancreas: Atrophied. Spleen: Negative aside from a small volume of simple density perisplenic fluid. Adrenals/Urinary Tract: Normal adrenal glands. Chronic  right renal scarring. Nonobstructed kidneys with symmetric contrast excretion. Decompressed ureters. Decompressed urinary bladder. Stomach/Bowel: Decompressed rectum. Sigmoid colon anastomosis on series 2, image 59 with no adverse features. Decompressed descending colon. Redundant splenic flexure, transverse colon with retained gas and stool. There is a small to large bowel anastomosis in the right abdomen (coronal image 52) with decompressed distal small bowel. However, there is an abrupt transition point in the right anterior abdomen on series 2, image 44 and coronal image 71 with upstream dilated small bowel with flocculated content (small bowel feces sign). Proximal to that fluid-filled small bowel loops are dilated up to 35 mm diameter throughout the mid jejunum. Distal small bowel is decompressed. Proximal small bowel loops are fluid-filled but have a more normal appearance. The stomach is mildly  distended with fluid. The duodenum is decompressed. No free air. Vascular/Lymphatic: Mild Aortoiliac calcified atherosclerosis. Major arterial structures remain patent. Portal venous system appears patent. No lymphadenopathy. Reproductive: Negative. Other: Small volume pelvic free fluid with simple fluid density appears to be tracking from the distal small bowel mesentery. Chronic postoperative changes to the ventral abdominal wall. Musculoskeletal: Stable mild L5 superior endplate compression. No acute or suspicious osseous lesion identified. IMPRESSION: 1. Small Bowel Obstruction is moderate to severe with a discrete transition point in the anterior right abdomen not far from the small to large bowel anastomosis. Favor adhesions. Small volume of free fluid in the abdomen and pelvis appears reactive. No free air. 2. Cholelithiasis without CT evidence of acute cholecystitis. Lung base atelectasis and scarring. Electronically Signed   By: Genevie Ann M.D.   On: 01/04/2021 07:46   DG Chest Port 1 View  Result Date:  01/04/2021 CLINICAL DATA:  NG tube placement. EXAM: PORTABLE CHEST 1 VIEW COMPARISON:  July 29, 2017. FINDINGS: NG tube courses below the diaphragm and is looped in the expected region of the stomach. Linear left basilar opacities. No visible pleural effusions or pneumothorax on these portable AP radiographs. Similar cardiomediastinal silhouette and aortic valve replacement with median sternotomy. Polyarticular degenerative change. IMPRESSION: 1. NG tube courses below the diaphragm and is looped in the expected region of the stomach. 2. Linear left basilar opacities, favor atelectasis. Electronically Signed   By: Margaretha Sheffield MD   On: 01/04/2021 08:54    Chest x-ray: Independently reviewed.  Nasogastric tube in stomach  Assessment/Plan Small bowel obstruction: Acute.  Patient presents with complaints of right upper quadrant abdominal pain.  CT imaging significant for small bowel obstruction with transition point in the right upper quadrant of the abdomen.  NG tube to suction was placed.  Risk factors include prior abdominal surgeries. -Admit medical telemetry bed -Small bowel protocol initiated -Monitor intake and output -Continue NG tube -Recheck abdominal x-ray tomorrow morning -Hydromorphone 0.5 mg IV as needed for severe pain -Normal saline IV fluids at 75 mL/h  -Saline spray for nose discomfort -General surgery consulted,  will follow-up for any further recommendations  History of bowel perforation and colon cancer: Patient with prior history of perforated sigmoid colon in 04/2016 requiring exploratory laparotomy with sigmoid colectomy and descending colostomy.  He subsequently underwent a colonoscopy with Dr. 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  Patient underwent right hemicolectomy 08/2016.  Essential hypertension: On admission blood pressure elevated up to 166/96 Home medications include metoprolol  succinate 25 mg daily, losartan 75 mg daily. -Hold home oral medication regimen -Changed metoprolol 2.5 mg IV every 8 hours -Hydralazine IV as needed for blood pressure greater than 180  Alcohol use: Patient reports drinking 3 glasses of wine daily.  His last drink was on 4/21. -CIWA protocols with Ativan as needed -Thiamine IV  Paroxysmal atrial fibrillation s/p AVR  PVCs: Patient reports history of having atrial fibrillation following aortic valve replacement, but not again since that time.  He frequently has PVCs which are controlled with metoprolol.  He is not on any anticoagulation -Continue substitution of metoprolol IV  Hypothyroidism: TSH last noted to be 2.07 on 10/19/2020.  Home medication regimen includes levothyroxine 50 mcg daily. -Consider using IV levothyroxine if patient requiring to be n.p.o. for prolonged period in time  GERD: Home medications include Pepcid 20 mg daily as needed for heartburn or indigestion. -Changed  Pepcid to IV and scheduled daily  Macular degeneration -Continue home eyedrops  Hyperlipidemia: Home medication regimen includes Zetia 10 mg daily and pravastatin 20 mg daily. -Hold home oral medication  OSA: Patient normally uses CPAP. -Declined use of NG tube is in place  DVT prophylaxis: lovenox Code Status: Full Family Communication: Wife updated at bedside Disposition Plan: Likely to discharge home at [place] called: surgery  Admission status: Inpatient, require more than 2 midnight stay for small bowel obstruction  Norval Morton MD [facility]   If 7PM-7AM, please contact night-coverage   01/04/2021, 10:53 AM

## 2021-01-04 NOTE — Plan of Care (Signed)
Transferred from [place] discussed with Dr. Ashok Cordia. Patient pmh HTN, PAF, AAA, sigmoid perforation, and colon cancer s/p resection presents with complaints of abd pain.  Found to have moderate to severe SBO with discrete transition point near anastomosis site.  Case was discussed with general surgery, but recommended TRH to admit.  NG tube was placed to suction.  Vital signs stable.  Patient was given IV fluids and pain medication.  Accepted to a medical telemetry bed as inpatient.  General surgery will need to be formally consulted upon patient arrival.

## 2021-01-04 NOTE — Plan of Care (Signed)

## 2021-01-04 NOTE — ED Provider Notes (Signed)
[facility] EMERGENCY DEPT Provider Note   CSN: [id number] Arrival date & time: 01/04/21  0516     History Chief Complaint  Patient presents with  . Abdominal Pain    [name] Brown is a 70 y.o. male.  HPI     This is a 70 year old male with history of hypertension, hyperlipidemia, paroxysmal atrial fibrillation, colon cancer status post colectomy who presents with abdominal pain.  Patient reports abdominal pain since 10 PM last night.  He states that it is in the right upper and mid abdomen.  He states it is fairly constant.  At times it "grabs him."  Currently he rates his pain 8 out of 10.  Denies nausea, vomiting, diarrhea.  He drinks some milk with minimal relief.  He states that he had 1 episode of similar discomfort on Friday which self resolved.  Denies back pain.  Has not had any fevers.  Patient does drink up to 3 glasses of wine daily but has not had anything to drink in the last 2 days.  He denies fevers, chest pain, shortness of breath.  He reports that he took a Bentyl with minimal relief.  Past Medical History:  Diagnosis Date  . Aortic stenosis   . Ascending aortic aneurysm ([place]) 09/18/2018  . Colon cancer ([place])   . Family history of adverse reaction to anesthesia    pt states his mother had 2 episodes of hyponatremia following anesthesia   . GERD (gastroesophageal reflux disease)   . Headache   . Heart murmur   . Hyperlipidemia 10/25/2016  . Hypertension    pt is currently not taking any medications; pt states is borderline  . Melanoma ([place])   . OSA on CPAP 09/18/2018  . PAF (paroxysmal atrial fibrillation) ([place]) 06/13/2017  . Perforated sigmoid colon ([place])   . PVC's (premature ventricular contractions) 10/25/2016  . Tinnitus   . Wears glasses     Patient Active Problem List   Diagnosis Date Noted  . Hypothyroid 09/24/2018  . OSA on CPAP 09/18/2018  . PAF (paroxysmal atrial fibrillation) ([place]) 06/13/2017  . S/P AVR (aortic valve replacement)  06/08/2017  . Non-ischemic cardiomyopathy ([place])   . Aortic stenosis with bicuspid valve 10/25/2016  . PVC's (premature ventricular contractions) 10/25/2016  . Hyperlipidemia 10/25/2016  . Coronary artery disease involving native coronary artery of native heart without angina pectoris 09/23/2016  . Acute blood loss anemia 09/23/2016  . History of colon cancer, stage I 08/18/2016  . Essential hypertension   . Stercoral ulcer of large intestine 04/29/2016  . Perforated sigmoid colon s/p colectomy/colostomy 04/26/2016 04/25/2016    Past Surgical History:  Procedure Laterality Date  . AORTIC VALVE REPLACEMENT N/A 06/08/2017   Procedure: AORTIC VALVE REPLACEMENT (AVR);  Surgeon: [name], MD;  Location: [facility];  Service: Open Heart Surgery;  Laterality: N/A;  Using 66mm Perimount Magna Ease Pericardial Bioprosthesis Aortic Valve  . COLOSTOMY CLOSURE N/A 08/18/2016   Procedure: COLOSTOMY CLOSURE OPEN PROCEDURE;  Surgeon: Armandina Gemma, MD;  Location: [facility];  Service: General;  Laterality: N/A;  . LAPAROTOMY N/A 04/25/2016   Procedure: EXPLORATORY LAPAROTOMY WITH SIGMOID COLECTOMY, COLOSTOMY;  Surgeon: Armandina Gemma, MD;  Location: [facility];  Service: General;  Laterality: N/A;  . LEFT HEART CATH AND CORONARY ANGIOGRAPHY N/A 11/03/2016   Procedure: Left Heart Cath and Coronary Angiography;  Surgeon: Burnell Blanks, MD;  Location: [facility];  Service: Cardiovascular;  Laterality: N/A;  . MELANOMA EXCISION    . PARTIAL  COLECTOMY Right 08/18/2016   Procedure: RIGHT COLECTOMY;  Surgeon: Armandina Gemma, MD;  Location: [facility];  Service: General;  Laterality: Right;  . TEE WITHOUT CARDIOVERSION N/A 06/08/2017   Procedure: TRANSESOPHAGEAL ECHOCARDIOGRAM (TEE);  Surgeon: [name], MD;  Location: [facility];  Service: Open Heart Surgery;  Laterality: N/A;       Family History  Problem Relation Age of Onset  . Dementia Mother   . Kidney disease Mother   . Vascular Disease Mother   . CAD  Mother   . Heart attack Father   . Alcohol abuse Father   . Cirrhosis Father   . Stroke Maternal Grandmother   . Stroke Paternal Grandmother     Social History   Tobacco Use  . Smoking status: Never Smoker  . Smokeless tobacco: Never Used  Vaping Use  . Vaping Use: Never used  Substance Use Topics  . Alcohol use: Yes    Alcohol/week: 21.0 - 24.0 standard drinks    Types: 21 - 24 Standard drinks or equivalent per week    Comment: 2-3 glasses of wine daily   . Drug use: No    Home Medications Prior to Admission medications   Medication Sig Start Date End Date Taking? Authorizing Provider  aspirin EC 81 MG tablet Take 81 mg by mouth daily.    [provider]  ezetimibe (ZETIA) 10 MG tablet TAKE ONE TABLET BY MOUTH DAILY 09/01/20   Skeet Latch, MD  famotidine (PEPCID) 20 MG tablet Take 20 mg by mouth daily as needed for heartburn or indigestion.    [provider]  ketorolac (ACULAR) 0.5 % ophthalmic solution SMARTSIG:In Eye(s) 11/20/20   [provider]  levothyroxine (SYNTHROID) 50 MCG tablet Take 1 tablet (50 mcg total) by mouth daily. 11/17/20   [name], MD  losartan (COZAAR) 50 MG tablet Take 1.5 tablets (75 mg total) by mouth daily. 12/28/20 03/28/21  Deberah Pelton, NP  metoprolol succinate (TOPROL-XL) 25 MG 24 hr tablet Take 1 tablet (25 mg total) by mouth daily. 12/29/20   Skeet Latch, MD  pravastatin (PRAVACHOL) 20 MG tablet Take 1 tablet (20 mg total) by mouth daily. 07/02/20   Skeet Latch, MD  prednisoLONE acetate (PRED FORTE) 1 % ophthalmic suspension  10/15/20   [provider]  rOPINIRole (REQUIP) 0.25 MG tablet Take 3 tablets (0.75 mg total) by mouth at bedtime. 09/22/20   Star Age, MD  tadalafil (CIALIS) 20 MG tablet Take 20 mg by mouth daily as needed for erectile dysfunction.    [provider]    Allergies    Patient has no known allergies.  Review of Systems   Review of Systems   Constitutional: Negative for fever.  Respiratory: Negative for shortness of breath.   Cardiovascular: Negative for chest pain.  Gastrointestinal: Positive for abdominal pain. Negative for abdominal distention, diarrhea, nausea and vomiting.  Genitourinary: Negative for dysuria and hematuria.  All other systems reviewed and are negative.   Physical Exam Updated Vital Signs BP 134/76   Pulse 77   Temp 97.7 F (36.5 C) (Oral)   Resp 15   Ht 1.651 m (5\' 5" )   Wt 76.7 kg   SpO2 96%   BMI 28.12 kg/m   Physical Exam Vitals and nursing note reviewed.  Constitutional:      Appearance: He is well-developed. He is not ill-appearing.  HENT:     Head: Normocephalic and atraumatic.  Eyes:     Pupils: Pupils are  equal, round, and reactive to light.  Cardiovascular:     Rate and Rhythm: Normal rate and regular rhythm.     Heart sounds: Normal heart sounds. No murmur heard.   Pulmonary:     Effort: Pulmonary effort is normal. No respiratory distress.     Breath sounds: Normal breath sounds. No wheezing.  Abdominal:     General: Bowel sounds are normal.     Palpations: Abdomen is soft.     Tenderness: There is abdominal tenderness in the right upper quadrant, right lower quadrant and periumbilical area. There is no guarding or rebound.  Musculoskeletal:     Cervical back: Neck supple.  Lymphadenopathy:     Cervical: No cervical adenopathy.  Skin:    General: Skin is warm and dry.  Neurological:     Mental Status: He is alert and oriented to person, place, and time.  Psychiatric:        Mood and Affect: Mood normal.     ED Results / Procedures / Treatments   Labs (all labs ordered are listed, but only abnormal results are displayed) Labs Reviewed  COMPREHENSIVE METABOLIC PANEL - Abnormal; Notable for the following components:      Result Value   Glucose, Bld 126 (*)    All other components within normal limits  LIPASE, BLOOD - Abnormal; Notable for the following  components:   Lipase 10 (*)    All other components within normal limits  URINALYSIS, ROUTINE W REFLEX MICROSCOPIC - Abnormal; Notable for the following components:   Ketones, ur 15 (*)    Protein, ur TRACE (*)    All other components within normal limits  CBC WITH DIFFERENTIAL/PLATELET    EKG None  Radiology No results found.  Procedures Procedures   Medications Ordered in ED Medications  iohexol (OMNIPAQUE) 300 MG/ML solution 80 mL (has no administration in time range)  morphine 2 MG/ML injection 2 mg (2 mg Intravenous Given 01/04/21 0537)  ondansetron (ZOFRAN) injection 4 mg (4 mg Intravenous Given 01/04/21 0537)  alum & mag hydroxide-simeth (MAALOX/MYLANTA) 200-200-20 MG/5ML suspension 30 mL (30 mLs Oral Given 01/04/21 0537)    ED Course  I have reviewed the triage vital signs and the nursing notes.  Pertinent labs & imaging results that were available during my care of the patient were reviewed by me and considered in my medical decision making (see chart for details).  Clinical Course as of 01/04/21 0634  Mon Jan 04, 2021  1027 Small improvement with Morphine.  Will obtain CT. [CH]    Clinical Course User Index [CH] Kharlie Bring, Barbette Hair, MD   MDM Rules/Calculators/A&P                          Patient presents with abdominal pain.  He is overall nontoxic and vital signs are reassuring.  He is tender in his mid right abdomen.  Normal bowel sounds.  No nausea or vomiting.  Considerations include but not limited to, cholecystitis, gastritis, pancreatitis.  Less likely obstructive process given no vomiting although he does have a significant surgical history.  Patient was given pain and nausea medication.  Lab work obtained.  No significant leukocytosis.  No significant metabolic derangements.  Normal LFTs and lipase.  No evidence of urinary tract infection.  On recheck, he reports ongoing pain which is only minimally improved with morphine.  Will obtain a CT scan for further  evaluation.  Patient signed out to oncoming provider.  Final Clinical Impression(s) / ED Diagnoses Final diagnoses:  Right sided abdominal pain    Rx / DC Orders ED Discharge Orders    None       Paiton Boultinghouse, Barbette Hair, MD 01/04/21 [phone]

## 2021-01-04 NOTE — ED Notes (Signed)
Patient transported to CT 

## 2021-01-04 NOTE — ED Provider Notes (Signed)
Signed out to check ct when resulted.  CT c/w sbo. NG tube/LIWS. Iv ns bolus. Pt requests pain med. Dilaudid .5 mg iv. zofran iv.   Recheck abd soft nt. Mildly distended.   General surgery consulted - discussed pt, and ct, surgical hx, etc, w Dr Donne Hazel, he agrees with NG, requests admit to hospitalist service (he will also let Dr Harlow Asa know pt being admitted) - Dr Donne Hazel does indicate for hospitalist to place consult once patient arrives to admitting hospital to ensure pt 'shows up on their list'.   Hospitalist consulted for admission.     Lajean Saver, MD 01/04/21 [phone]

## 2021-01-04 NOTE — Progress Notes (Signed)
Pt arrived to room 6N01 from [facility] ED. Received report from [place], Therapist, sports. See assessment. Will continue to monitor.

## 2021-01-04 NOTE — ED Notes (Signed)
Patient returned from CT

## 2021-01-05 LAB — BASIC METABOLIC PANEL
Anion gap: 8 (ref 5–15)
BUN: 13 mg/dL (ref 8–23)
CO2: 29 mmol/L (ref 22–32)
Calcium: 9.3 mg/dL (ref 8.9–10.3)
Chloride: 102 mmol/L (ref 98–111)
Creatinine, Ser: 1.17 mg/dL (ref 0.61–1.24)
GFR, Estimated: >60 mL/min (ref >60)
Glucose, Bld: 100 mg/dL — ABNORMAL HIGH (ref 70–99)
Potassium: 4.9 mmol/L (ref 3.5–5.1)
Sodium: 139 mmol/L (ref 135–145)

## 2021-01-05 LAB — CBC
HCT: 47.8 % (ref 39.0–52.0)
Hemoglobin: 16.2 g/dL (ref 13.0–17.0)
MCH: 33.1 pg (ref 26.0–34.0)
MCHC: 33.9 g/dL (ref 30.0–36.0)
MCV: 97.6 fL (ref 80.0–100.0)
Platelets: 202 10*3/uL (ref 150–400)
RBC: 4.9 MIL/uL (ref 4.22–5.81)
RDW: 13.2 % (ref 11.5–15.5)
WBC: 8.4 10*3/uL (ref 4.0–10.5)
nRBC: 0 % (ref 0.0–0.2)

## 2021-01-05 LAB — PHOSPHORUS: Phosphorus: 4.6 mg/dL (ref 2.5–4.6)

## 2021-01-05 LAB — HIV ANTIBODY (ROUTINE TESTING W REFLEX): HIV Screen 4th Generation wRfx: NONREACTIVE

## 2021-01-05 LAB — MAGNESIUM: Magnesium: 2.2 mg/dL (ref 1.7–2.4)

## 2021-01-05 MED ORDER — HYDRALAZINE HCL 20 MG/ML IJ SOLN: 10.0000 mg | Freq: Four times a day (QID) | INTRAMUSCULAR | Status: DC | PRN

## 2021-01-05 NOTE — Progress Notes (Signed)
PROGRESS NOTE  [name] Brown  DOB: May 08, 1951  PCP: [name], MD SWF:[id number]  DOA: 01/04/2021  LOS: 1 day   Chief Complaint  Patient presents with  . Abdominal Pain   Brief narrative: [name] Brown is a 70 y.o. male with PMH significant for HTN, HLD, A. fib following AVR, PVCs, AAA, hypothyroidism, bowel perforation requiring sigmoidectomy and colon cancer s/p right hemicolectomy, OSA on CPAP. Patient presented to the ED on 4/25 with complaint of right upper quadrant abdominal pain, waxing and waning for 3 days.  Last bowel movement 2 days ago, Patient has a history of perforated sigmoid colon in 04/2016 requiring exploratory laparotomy with sigmoid colectomy and descending colostomy.  He subsequently underwent a colonoscopy with Dr. 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  In the ED, patient was hemodynamically stable Labs unremarkable CT of the abdomen pelvis with contrast significant for moderate to severe small bowel obstruction with transition point in the anterior right abdomen near small to large bowel anastomosis for which adhesions was favored. Patient was given Zofran, morphine, diluadid, and zofran. Admitted to hospitalist service General surgery consultation obtained.  Subjective: Patient was seen and examined this morning. Pleasant elderly Caucasian male. Sitting up in chair. Not in distress Chart reviewed Blood pressure in 150s  Labs unremarkable including potassium, magnesium and phosphorus level  Assessment/Plan: Small bowel obstruction -Presented with abdominal pain, no bowel movement, no flatus. -CT imaging significant for moderate to severe small bowel obstruction with transition point in the right upper quadrant of the abdomen.   -Currently conservative management with NG tube suction, n.p.o. status, IV fluid,, pain control   -General surgery following. Recent Labs  Lab 01/04/21 0526 01/05/21 0134  K 4.6 4.9  MG  --  2.2  PHOS  --  4.6   Essential hypertension -Continue home meds that include metoprolol succinate 25 mg daily, losartan 75 mg daily. -IV hydralazine as needed  Paroxysmal A. fib after AVR -On metoprolol at home.  Currently on hold because of n.p.o. status.  Not on anticoagulation  Hyperlipidemia -Continue Zetia 10 mg daily and pravastatin 20 mg daily once oral intake is allowed  Chronic alcohol use -Patient drinks 3 glasses of wine daily, last drink on 4/21 -Monitor for withdrawal symptoms. Currently on CIWA protocol  Hypothyroidism -Continue Synthroid 50 mcg daily.  GERD -Continue Pepcid  Macular degeneration -Continue home eyedrops  OSA -On CPAP at home.   History of bowel perforation and colon cancer -Patient with prior history of perforated sigmoid colon in 04/2016 requiring exploratory laparotomy with sigmoid colectomy and descending colostomy.  He subsequently underwent a colonoscopy with Dr. 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  Patient underwent right hemicolectomy 08/2016.  Mobility: Encourage ambulation Code Status:   Code Status: Full Code  Nutritional status: Body mass index is 28.12 kg/m.     Diet Order            Diet NPO time specified Except for: Ice Chips  Diet effective now                 DVT prophylaxis: enoxaparin (LOVENOX) injection 40 mg Start: 01/04/21 1115   Antimicrobials:  None Fluid: Normal saline at 75 mill per hour Consultants: General surgery Family Communication:  None at bedside  Status is: Inpatient  Remains inpatient  appropriate because: Bowel obstruction, may need surgery  Dispo: The patient is from: Home              Anticipated d/c is to: Home              Patient currently is not medically stable to d/c.   Difficult to place patient  No     Infusions:  . sodium chloride 75 mL/hr at 01/05/21 0700  . famotidine (PEPCID) IV 20 mg (01/05/21 1152)    Scheduled Meds: . enoxaparin (LOVENOX) injection  40 mg Subcutaneous Daily  . ketorolac  1 drop Both Eyes QID  . prednisoLONE acetate  1 drop Both Eyes QID  . sodium chloride flush  3 mL Intravenous Q12H  . thiamine  100 mg Oral Daily   Or  . thiamine  100 mg Intravenous Daily    Antimicrobials: Anti-infectives (From admission, onward)   None      PRN meds: acetaminophen **OR** acetaminophen, albuterol, hydrALAZINE, HYDROmorphone (DILAUDID) injection, ketorolac, LORazepam **OR** LORazepam, ondansetron **OR** ondansetron (ZOFRAN) IV, sodium chloride   Objective: Vitals:   01/05/21 1023 01/05/21 1348  BP: (!) 150/94 (!) 141/78  Pulse: 68 66  Resp: 16 16  Temp: 98.3 F (36.8 C) 98 F (36.7 C)  SpO2: 95% 96%    Intake/Output Summary (Last 24 hours) at 01/05/2021 1450 Last data filed at 01/05/2021 1303 Gross per 24 hour  Intake 1426.45 ml  Output 350 ml  Net 1076.45 ml   Filed Weights   01/04/21 0524  Weight: 76.7 kg   Weight change:  Body mass index is 28.12 kg/m.   Physical Exam: General exam: Pleasant, elderly Caucasian male.  Not in distress. Skin: No rashes, lesions or ulcers. HEENT: Atraumatic, normocephalic, no obvious bleeding Lungs: Clear to auscultation bilaterally CVS: Regular rate and rhythm, no murmur GI/Abd soft, mild generalized tenderness, bowel sound present CNS: Alert, awake, oriented x3 Psychiatry: Mood appropriate Extremities: No pedal edema, no calf tenderness  Data Review: I have personally reviewed the laboratory data and studies available.  Recent Labs  Lab 01/04/21 0526 01/05/21 0134  WBC 9.2 8.4  NEUTROABS 7.6  --   HGB 16.8 16.2  HCT 48.9 47.8  MCV 96.6 97.6  PLT 184 202   Recent Labs  Lab 01/04/21 0526 01/05/21 0134  NA 137 139  K 4.6 4.9  CL 100 102  CO2 29 29  GLUCOSE 126* 100*  BUN 15 13   CREATININE 1.09 1.17  CALCIUM 9.6 9.3  MG  --  2.2  PHOS  --  4.6    F/u labs ordered Unresulted Labs (From admission, onward)          Start     Ordered   01/06/21 0500  CBC with Differential/Platelet  Daily,   R      01/05/21 1450   01/06/21 0092  Basic metabolic panel  Daily,   R      01/05/21 1450          Signed, Terrilee Croak, MD [facility] 01/05/2021

## 2021-01-05 NOTE — Progress Notes (Addendum)
[facility] Progress Note     Subjective: CC-  Up in chair. Continues to have some intermittent abdominal pain, although right now he denies any pain. Denies n/v. Started passing a little flatus last night and this morning. No BM. NG tube with only 250cc output. Xray shows contrast only into the stomach and small bowel with persistently dilated loops of small intestine.  Objective: Vital signs in last 24 hours: Temp:  [97.6 F (36.4 C)-99.8 F (37.7 C)] 98.2 F (36.8 C) (04/26 0217) Pulse Rate:  [71-84] 75 (04/26 0217) Resp:  [14-18] 16 (04/26 0217) BP: (121-166)/(78-107) 141/78 (04/26 0217) SpO2:  [93 %-99 %] 94 % (04/26 0217) Last BM Date: 01/02/21  Intake/Output from previous day: 04/25 0701 - 04/26 0700 In: 1369.5 [I.V.:1319.5; IV Piggyback:50] Out: 250 [Emesis/NG output:250] Intake/Output this shift: No intake/output data recorded.  PE: Gen:  Alert, NAD, pleasant Pulm:  rate and effort normal Abd: Soft, minimal distension, +BS, nontender Skin: no rashes noted, warm and dry  Lab Results:  Recent Labs    01/04/21 0526 01/05/21 0134  WBC 9.2 8.4  HGB 16.8 16.2  HCT 48.9 47.8  PLT 184 202   BMET Recent Labs    01/04/21 0526 01/05/21 0134  NA 137 139  K 4.6 4.9  CL 100 102  CO2 29 29  GLUCOSE 126* 100*  BUN 15 13  CREATININE 1.09 1.17  CALCIUM 9.6 9.3   PT/INR No results for input(s): LABPROT, INR in the last 72 hours. CMP     Component Value Date/Time   NA 139 01/05/2021 0134   NA 137 12/24/2020 1157   K 4.9 01/05/2021 0134   CL 102 01/05/2021 0134   CO2 29 01/05/2021 0134   GLUCOSE 100 (H) 01/05/2021 0134   BUN 13 01/05/2021 0134   BUN 17 12/24/2020 1157   CREATININE 1.17 01/05/2021 0134   CREATININE 1.04 10/25/2016 1547   CALCIUM 9.3 01/05/2021 0134   PROT 6.7 01/04/2021 0526   PROT 6.5 09/01/2020 0925   ALBUMIN 4.3 01/04/2021 0526   ALBUMIN 4.6 09/01/2020 0925   AST 21 01/04/2021 0526   ALT 26 01/04/2021 0526   ALKPHOS 51  01/04/2021 0526   BILITOT 0.7 01/04/2021 0526   BILITOT 0.6 09/01/2020 0925   GFRNONAA >60 01/05/2021 0134   GFRAA 77 09/01/2020 0925   Lipase     Component Value Date/Time   LIPASE 10 (L) 01/04/2021 0526       Studies/Results: CT ABDOMEN PELVIS W CONTRAST  Result Date: 01/04/2021 CLINICAL DATA:  70 year old male with abdominal pain on set overnight. History of melanoma and colon cancer. EXAM: CT ABDOMEN AND PELVIS WITH CONTRAST TECHNIQUE: Multidetector CT imaging of the abdomen and pelvis was performed using the standard protocol following bolus administration of intravenous contrast. CONTRAST:  79mL OMNIPAQUE IOHEXOL 300 MG/ML  SOLN COMPARISON:  CTA chest 05/06/2019. CT Abdomen and Pelvis 04/25/2016, 12/23/2018. FINDINGS: Lower chest: Lower lung volumes with bilateral lower lobe atelectasis superimposed on some scarring along the left major fissure. No pericardial or pleural effusion. Hepatobiliary: Small volume of perihepatic free fluid with simple fluid density. Liver enhancement is stable since 2017 with small benign caudate cysts suspected on series 2, image 16. Cholelithiasis layering on series 2, image 24. No pericholecystic inflammation or bile duct enlargement. Pancreas: Atrophied. Spleen: Negative aside from a small volume of simple density perisplenic fluid. Adrenals/Urinary Tract: Normal adrenal glands. Chronic right renal scarring. Nonobstructed kidneys with symmetric contrast excretion. Decompressed ureters. Decompressed urinary bladder.  Stomach/Bowel: Decompressed rectum. Sigmoid colon anastomosis on series 2, image 59 with no adverse features. Decompressed descending colon. Redundant splenic flexure, transverse colon with retained gas and stool. There is a small to large bowel anastomosis in the right abdomen (coronal image 52) with decompressed distal small bowel. However, there is an abrupt transition point in the right anterior abdomen on series 2, image 44 and coronal image 71  with upstream dilated small bowel with flocculated content (small bowel feces sign). Proximal to that fluid-filled small bowel loops are dilated up to 35 mm diameter throughout the mid jejunum. Distal small bowel is decompressed. Proximal small bowel loops are fluid-filled but have a more normal appearance. The stomach is mildly distended with fluid. The duodenum is decompressed. No free air. Vascular/Lymphatic: Mild Aortoiliac calcified atherosclerosis. Major arterial structures remain patent. Portal venous system appears patent. No lymphadenopathy. Reproductive: Negative. Other: Small volume pelvic free fluid with simple fluid density appears to be tracking from the distal small bowel mesentery. Chronic postoperative changes to the ventral abdominal wall. Musculoskeletal: Stable mild L5 superior endplate compression. No acute or suspicious osseous lesion identified. IMPRESSION: 1. Small Bowel Obstruction is moderate to severe with a discrete transition point in the anterior right abdomen not far from the small to large bowel anastomosis. Favor adhesions. Small volume of free fluid in the abdomen and pelvis appears reactive. No free air. 2. Cholelithiasis without CT evidence of acute cholecystitis. Lung base atelectasis and scarring. Electronically Signed   By: Genevie Ann M.D.   On: 01/04/2021 07:46   DG Chest Port 1 View  Result Date: 01/04/2021 CLINICAL DATA:  NG tube placement. EXAM: PORTABLE CHEST 1 VIEW COMPARISON:  July 29, 2017. FINDINGS: NG tube courses below the diaphragm and is looped in the expected region of the stomach. Linear left basilar opacities. No visible pleural effusions or pneumothorax on these portable AP radiographs. Similar cardiomediastinal silhouette and aortic valve replacement with median sternotomy. Polyarticular degenerative change. IMPRESSION: 1. NG tube courses below the diaphragm and is looped in the expected region of the stomach. 2. Linear left basilar opacities, favor  atelectasis. Electronically Signed   By: Margaretha Sheffield MD   On: 01/04/2021 08:54   DG Abd Portable 1V  Result Date: 01/04/2021 CLINICAL DATA:  Bowel obstruction. Post Gastrografin administration. EXAM: PORTABLE ABDOMEN - 1 VIEW COMPARISON:  Radiograph earlier today. FINDINGS: Tip and side port of the enteric tube below the diaphragm in the stomach. Administered enteric contrast is seen within the stomach and throughout small bowel loops. Mild small bowel dilatation again seen. No definite contrast in the colon. Small volume of colonic stool. Excreted IV contrast within the urinary bladder from CT earlier today. IMPRESSION: Administered enteric contrast within the stomach and small bowel, no definite contrast in the colon. Persistent small bowel dilatation. Electronically Signed   By: Keith Rake M.D.   On: 01/04/2021 23:45   DG Abd Portable 1V-Small Bowel Obstruction Protocol-initial, 8 hr delay  Result Date: 01/04/2021 CLINICAL DATA:  Small bowel obstruction. EXAM: PORTABLE ABDOMEN - 1 VIEW COMPARISON:  CT abdomen and pelvis 01/04/2021. FINDINGS: New NG tube is in place with the tip and side-port in the stomach. Scattered dilated loops of small bowel measuring up to 3.5 cm are seen but better demonstrated on the prior CT. Gas and stool in the ascending and transverse colon are unchanged. There is some contrast material in the patient's urinary bladder from his CT scan. IMPRESSION: No change in small bowel dilatation compatible with obstruction  as seen on prior CT. NG tube in good position. Electronically Signed   By: Inge Rise M.D.   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 He has started passing some flatus and NG tube output low. Repeat xray in AM. Continue NGT to LIWS. Mobilize.  FEN: NPO, IVF, NGT to LIWS VTE: SCDs, olovenox ID: no current abx    LOS: 1 day    Wellington Hampshire, [facility] 01/05/2021, 8:06 AM Please see Amion for pager number during day hours 7:00am-4:30pm

## 2021-01-06 ENCOUNTER — Inpatient Hospital Stay (HOSPITAL_COMMUNITY): Payer: Medicare Other

## 2021-01-06 LAB — CBC WITH DIFFERENTIAL/PLATELET
Abs Immature Granulocytes: 0.05 10*3/uL (ref 0.00–0.07)
Basophils Absolute: 0 10*3/uL (ref 0.0–0.1)
Basophils Relative: 0 %
Eosinophils Absolute: 0.1 10*3/uL (ref 0.0–0.5)
Eosinophils Relative: 1 %
HCT: 42.2 % (ref 39.0–52.0)
Hemoglobin: 14.4 g/dL (ref 13.0–17.0)
Immature Granulocytes: 1 %
Lymphocytes Relative: 12 %
Lymphs Abs: 0.8 10*3/uL (ref 0.7–4.0)
MCH: 33.5 pg (ref 26.0–34.0)
MCHC: 34.1 g/dL (ref 30.0–36.0)
MCV: 98.1 fL (ref 80.0–100.0)
Monocytes Absolute: 0.6 10*3/uL (ref 0.1–1.0)
Monocytes Relative: 9 %
Neutro Abs: 4.9 10*3/uL (ref 1.7–7.7)
Neutrophils Relative %: 77 %
Platelets: 165 10*3/uL (ref 150–400)
RBC: 4.3 MIL/uL (ref 4.22–5.81)
RDW: 12.9 % (ref 11.5–15.5)
WBC: 6.4 10*3/uL (ref 4.0–10.5)
nRBC: 0 % (ref 0.0–0.2)

## 2021-01-06 LAB — BASIC METABOLIC PANEL
Anion gap: 11 (ref 5–15)
BUN: 13 mg/dL (ref 8–23)
CO2: 24 mmol/L (ref 22–32)
Calcium: 8.6 mg/dL — ABNORMAL LOW (ref 8.9–10.3)
Chloride: 102 mmol/L (ref 98–111)
Creatinine, Ser: 1.08 mg/dL (ref 0.61–1.24)
GFR, Estimated: >60 mL/min (ref >60)
Glucose, Bld: 73 mg/dL (ref 70–99)
Potassium: 4.1 mmol/L (ref 3.5–5.1)
Sodium: 137 mmol/L (ref 135–145)

## 2021-01-06 MED ORDER — ROPINIROLE HCL 0.5 MG PO TABS: 0.7500 mg | ORAL_TABLET | Freq: Every day | ORAL | Status: DC

## 2021-01-06 MED ORDER — EZETIMIBE 10 MG PO TABS
10.0000 mg | ORAL_TABLET | Freq: Every day | ORAL | Status: DC
  Administered 2021-01-06: 10 mg via ORAL
  Filled 2021-01-06: qty 1

## 2021-01-06 MED ORDER — PHENOL 1.4 % MT LIQD: 1.0000 | OROMUCOSAL | Status: DC | PRN

## 2021-01-06 MED ORDER — LOSARTAN POTASSIUM 50 MG PO TABS
75.0000 mg | ORAL_TABLET | Freq: Every day | ORAL | Status: DC
  Administered 2021-01-06: 75 mg via ORAL
  Filled 2021-01-06: qty 1

## 2021-01-06 MED ORDER — METOPROLOL SUCCINATE ER 25 MG PO TB24
25.0000 mg | ORAL_TABLET | Freq: Every day | ORAL | Status: DC
  Administered 2021-01-06: 25 mg via ORAL
  Filled 2021-01-06: qty 1

## 2021-01-06 MED ORDER — OXYMETAZOLINE HCL 0.05 % NA SOLN
1.0000 | Freq: Two times a day (BID) | NASAL | Status: DC | PRN
  Administered 2021-01-06: 1 via NASAL
  Filled 2021-01-06: qty 30

## 2021-01-06 MED ORDER — PRAVASTATIN SODIUM 10 MG PO TABS
20.0000 mg | ORAL_TABLET | Freq: Every day | ORAL | Status: DC
  Administered 2021-01-06: 20 mg via ORAL
  Filled 2021-01-06: qty 2

## 2021-01-06 MED ORDER — FAMOTIDINE 20 MG PO TABS: 20.0000 mg | ORAL_TABLET | Freq: Every day | ORAL | Status: DC | PRN

## 2021-01-06 MED ORDER — LEVOTHYROXINE SODIUM 50 MCG PO TABS: 50.0000 ug | ORAL_TABLET | Freq: Every day | ORAL | Status: DC

## 2021-01-06 NOTE — Progress Notes (Signed)
[facility] Progress Note     Subjective: CC-  Only complaining of pain from NG tube. Denies abdominal pain, nausea, vomiting. BM yesterday. Passing flatus this morning. NG tube with only 300cc out last 24 hours.  Objective: Vital signs in last 24 hours: Temp:  [98 F (36.7 C)-98.5 F (36.9 C)] 98.5 F (36.9 C) (04/27 0557) Pulse Rate:  [66-74] 72 (04/27 0557) Resp:  [16-18] 18 (04/27 0557) BP: (141-150)/(77-94) 146/77 (04/27 0557) SpO2:  [95 %-100 %] 96 % (04/27 0557) Last BM Date: 01/05/21  Intake/Output from previous day: 04/26 0701 - 04/27 0700 In: 1857.2 [P.O.:60; I.V.:1747.2; IV Piggyback:50] Out: 300 [Emesis/NG output:300] Intake/Output this shift: No intake/output data recorded.  PE: Gen:  Alert, NAD, pleasant Pulm:  rate and effort normal Abd: Soft, minimal distension, +BS, nontender Skin: no rashes noted, warm and dry   Lab Results:  Recent Labs    01/05/21 0134 01/06/21 0110  WBC 8.4 6.4  HGB 16.2 14.4  HCT 47.8 42.2  PLT 202 165   BMET Recent Labs    01/05/21 0134 01/06/21 0110  NA 139 137  K 4.9 4.1  CL 102 102  CO2 29 24  GLUCOSE 100* 73  BUN 13 13  CREATININE 1.17 1.08  CALCIUM 9.3 8.6*   PT/INR No results for input(s): LABPROT, INR in the last 72 hours. CMP     Component Value Date/Time   NA 137 01/06/2021 0110   NA 137 12/24/2020 1157   K 4.1 01/06/2021 0110   CL 102 01/06/2021 0110   CO2 24 01/06/2021 0110   GLUCOSE 73 01/06/2021 0110   BUN 13 01/06/2021 0110   BUN 17 12/24/2020 1157   CREATININE 1.08 01/06/2021 0110   CREATININE 1.04 10/25/2016 1547   CALCIUM 8.6 (L) 01/06/2021 0110   PROT 6.7 01/04/2021 0526   PROT 6.5 09/01/2020 0925   ALBUMIN 4.3 01/04/2021 0526   ALBUMIN 4.6 09/01/2020 0925   AST 21 01/04/2021 0526   ALT 26 01/04/2021 0526   ALKPHOS 51 01/04/2021 0526   BILITOT 0.7 01/04/2021 0526   BILITOT 0.6 09/01/2020 0925   GFRNONAA >60 01/06/2021 0110   GFRAA 77 09/01/2020 0925   Lipase      Component Value Date/Time   LIPASE 10 (L) 01/04/2021 0526       Studies/Results: DG Chest Port 1 View  Result Date: 01/04/2021 CLINICAL DATA:  NG tube placement. EXAM: PORTABLE CHEST 1 VIEW COMPARISON:  July 29, 2017. FINDINGS: NG tube courses below the diaphragm and is looped in the expected region of the stomach. Linear left basilar opacities. No visible pleural effusions or pneumothorax on these portable AP radiographs. Similar cardiomediastinal silhouette and aortic valve replacement with median sternotomy. Polyarticular degenerative change. IMPRESSION: 1. NG tube courses below the diaphragm and is looped in the expected region of the stomach. 2. Linear left basilar opacities, favor atelectasis. Electronically Signed   By: Margaretha Sheffield MD   On: 01/04/2021 08:54   DG Abd Portable 1V  Result Date: 01/04/2021 CLINICAL DATA:  Bowel obstruction. Post Gastrografin administration. EXAM: PORTABLE ABDOMEN - 1 VIEW COMPARISON:  Radiograph earlier today. FINDINGS: Tip and side port of the enteric tube below the diaphragm in the stomach. Administered enteric contrast is seen within the stomach and throughout small bowel loops. Mild small bowel dilatation again seen. No definite contrast in the colon. Small volume of colonic stool. Excreted IV contrast within the urinary bladder from CT earlier today. IMPRESSION: Administered enteric contrast within the  stomach and small bowel, no definite contrast in the colon. Persistent small bowel dilatation. Electronically Signed   By: Keith Rake M.D.   On: 01/04/2021 23:45   DG Abd Portable 1V-Small Bowel Obstruction Protocol-initial, 8 hr delay  Result Date: 01/04/2021 CLINICAL DATA:  Small bowel obstruction. EXAM: PORTABLE ABDOMEN - 1 VIEW COMPARISON:  CT abdomen and pelvis 01/04/2021. FINDINGS: New NG tube is in place with the tip and side-port in the stomach. Scattered dilated loops of small bowel measuring up to 3.5 cm are seen but better  demonstrated on the prior CT. Gas and stool in the ascending and transverse colon are unchanged. There is some contrast material in the patient's urinary bladder from his CT scan. IMPRESSION: No change in small bowel dilatation compatible with obstruction as seen on prior CT. NG tube in good position. Electronically Signed   By: Inge Rise M.D.   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 Clamp NG tube and give clear liquids. Will d/c NG tube in a few hours and advance to fulls if tolerating clamping trial. Ok for discharge home later today vs tomorrow if he tolerates diet advancement.  FEN: clamp NG, CLD VTE: SCDs, lovenox ID: no current abx    LOS: 2 days    Wellington Hampshire, [facility] 01/06/2021, 7:51 AM Please see Amion for pager number during day hours 7:00am-4:30pm

## 2021-01-06 NOTE — Discharge Summary (Signed)
Physician Discharge Summary  [name] XTG:[id number] DOB: [birth year] DOA: 01/04/2021  PCP: Sheliah Hatch, MD  Admit date: 01/04/2021 Discharge date: 01/06/2021  Admitted From: Home Discharge disposition: Home   Code Status: Full Code  Diet Recommendation: Cardiac diet  Discharge Diagnosis:   Principal Problem:   SBO (small bowel obstruction) (HCC) Active Problems:   Essential hypertension   PVC's (premature ventricular contractions)   S/P AVR (aortic valve replacement)   PAF (paroxysmal atrial fibrillation) (HCC)   OSA on CPAP   Hypothyroid   Alcohol use  Chief Complaint  Patient presents with  . Abdominal Pain   Brief narrative: [name] Brown is a 70 y.o. male with PMH significant for HTN, HLD, A. fib following AVR, PVCs, AAA, hypothyroidism, bowel perforation requiring sigmoidectomy and colon cancer s/p right hemicolectomy, OSA on CPAP. Patient presented to the ED on 4/25 with complaint of right upper quadrant abdominal pain, waxing and waning for 3 days.  Last bowel movement 2 days ago, Patient has a history of perforated sigmoid colon in 04/2016 requiring exploratory laparotomy with sigmoid colectomy and descending colostomy.  He subsequently underwent a colonoscopy with Dr. 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  In the ED, patient was hemodynamically stable Labs unremarkable CT of the abdomen pelvis with contrast significant for moderate to severe small bowel obstruction with transition point in the anterior right abdomen near small to large bowel anastomosis for which adhesions was favored. Patient was given Zofran, morphine, diluadid, and zofran. Admitted to hospitalist service General surgery consultation obtained.  Subjective: Patient was seen and examined this morning.  Sitting up in bed.  Not in distress.  Had BM this morning.    General surgery follow-up appreciated.  NG tube clamped.  Taking breakfast.   Tolerated well. Ok to discharge home today per surgery.  Assessment/Plan: Small bowel obstruction -Presented with abdominal pain, no bowel movement, no flatus. -CT imaging significant for moderate to severe small bowel obstruction with transition point in the right upper quadrant of the abdomen.   -Seems to be improving with conservative management.  Had BM this morning.   General surgery follow-up appreciated.  NG tube clamped.  Taking breakfast.   -Home this afternoon Recent Labs  Lab 01/04/21 0526 01/05/21 0134 01/06/21 0110  K 4.6 4.9 4.1  MG  --  2.2  --   PHOS  --  4.6  --    Essential hypertension -home meds include metoprolol succinate 25 mg daily, losartan 75 mg daily. -Resumed  Paroxysmal A. fib after AVR -Continue metoprolol. Not on anticoagulation  Hyperlipidemia -Resume Zetia 10 mg daily and pravastatin 20 mg daily post discharge.  Chronic alcohol use -Patient drinks 3 glasses of wine daily, last drink on 4/21 -No withdrawal symptoms in the hospital.  Hypothyroidism -Continue Synthroid 50 mcg daily.  GERD -Continue Pepcid  Macular degeneration -Continue home eyedrops  OSA -On CPAP at home.   History of bowel perforation and colon cancer -Patient with prior history of perforated sigmoid colon in 04/2016 requiring exploratory laparotomy with sigmoid colectomy and descending colostomy.  He subsequently underwent a colonoscopy with Dr. 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  Patient underwent right hemicolectomy 08/2016.   Wound care: Incision (Closed) 04/25/16 Abdomen Other (Comment) (Active)  Date First Assessed/Time First Assessed: 04/25/16 2249  Location: Abdomen  Location Orientation: Other (Comment)    Assessments 04/26/2016  8:00 AM 05/03/2016  9:27 AM  Dressing Type Abdominal pads Moist  to dry  Dressing Clean;Dry;Intact Clean;Dry;Intact  Dressing Change Frequency Twice a day Daily  Site / Wound Assessment -- Clean;Dry;Bleeding  Drainage Amount -- Moderate  Drainage Description -- Serosanguineous  Treatment Other (Comment) Cleansed;Other (Comment)     No Linked orders to display     Incision (Closed) 08/18/16 Abdomen Other (Comment) (Active)  Date First Assessed/Time First Assessed: 08/18/16 1425   Location: Abdomen  Location Orientation: Other (Comment)    Assessments 08/18/2016  3:30 PM 08/23/2016  8:00 AM  Dressing Clean;Dry;Intact --  Site / Wound Assessment -- Clean;Dry  Margins -- Attached edges (approximated)  Closure -- Staples  Drainage Amount -- None     No Linked orders to display     Incision (Closed) 06/08/17 Chest Other (Comment) (Active)  Date First Assessed/Time First Assessed: 06/08/17 0921   Location: Chest  Location Orientation: Other (Comment)    Assessments 06/08/2017 12:30 PM 06/14/2017  9:52 AM  Dressing Type Gauze (Comment) None  Dressing Clean;Dry;Intact --  Dressing Change Frequency Daily --  Site / Wound Assessment Dressing in place / Unable to assess --  Margins -- Attached edges (approximated)  Closure -- Skin glue  Drainage Amount -- None  Treatment -- Cleansed     No Linked orders to display    Discharge Exam:   Vitals:   01/05/21 2029 01/06/21 0557 01/06/21 1015 01/06/21 1259  BP: (!) 142/86 (!) 146/77 (!) 151/89 135/81  Pulse: 74 72 100 83  Resp: 17 18 12 16   Temp: 98.4 F (36.9 C) 98.5 F (36.9 C) 98.1 F (36.7 C) 98.1 F (36.7 C)  TempSrc: Oral Oral  Oral  SpO2: 100% 96% 96% 97%  Weight:      Height:        Body mass index is 28.12 kg/m.  General exam: Pleasant, elderly Caucasian male.  Not in distress. Skin: No rashes, lesions or ulcers. HEENT: Atraumatic, normocephalic, no obvious bleeding.  NG tube clamped Lungs: Clear to auscultation bilaterally CVS: Regular rate and rhythm, no murmur GI/Abd soft,  nontender, nondistended, bowel sound present CNS: Alert, awake, oriented x3 Psychiatry: Mood appropriate Extremities: No pedal edema, no calf tenderness  Follow ups:   Discharge Instructions    Diet - low sodium heart healthy   Complete by: As directed    Increase activity slowly   Complete by: As directed       Follow-up Information    [name], MD.   Specialty: Family Medicine Contact information: [address] [place] [address] [phone]               Recommendations for Outpatient Follow-Up:   1. Follow-up with PCP as an outpatient  Discharge Instructions:  Follow with Primary MD [name], MD in 7 days   Get CBC/BMP checked in next visit within 1 week by PCP or SNF MD ( we routinely change or add medications that can affect your baseline labs and fluid status, therefore we recommend that you get the mentioned basic workup next visit with your PCP, your PCP may decide not to get them or add new tests based on their clinical decision)  On your next visit with your PCP, please Get Medicines reviewed and adjusted.  Please request your PCP  to go over all Hospital Tests and Procedure/Radiological results at the follow  up, please get all Hospital records sent to your Prim MD by signing hospital release before you go home.  Activity: As tolerated with Full fall precautions use walker/cane & assistance as needed  For Heart failure patients - Check your Weight same time everyday, if you gain over 2 pounds, or you develop in leg swelling, experience more shortness of breath or chest pain, call your Primary MD immediately. Follow Cardiac Low Salt Diet and 1.5 lit/day fluid restriction.  If you have smoked or chewed Tobacco in the last 2 yrs please stop smoking, stop any regular Alcohol  and or any Recreational drug use.  If you experience worsening of your admission symptoms, develop shortness of breath, life threatening emergency, suicidal  or homicidal thoughts you must seek medical attention immediately by calling 911 or calling your MD immediately  if symptoms less severe.  You Must read complete instructions/literature along with all the possible adverse reactions/side effects for all the Medicines you take and that have been prescribed to you. Take any new Medicines after you have completely understood and accpet all the possible adverse reactions/side effects.   Do not drive, operate heavy machinery, perform activities at heights, swimming or participation in water activities or provide baby sitting services if your were admitted for syncope or siezures until you have seen by Primary MD or a Neurologist and advised to do so again.  Do not drive when taking Pain medications.  Do not take more than prescribed Pain, Sleep and Anxiety Medications  Wear Seat belts while driving.   Please note You were cared for by a hospitalist during your hospital stay. If you have any questions about your discharge medications or the care you received while you were in the hospital after you are discharged, you can call the unit and asked to speak with the hospitalist on call if the hospitalist that took care of you is not available. Once you are discharged, your primary care physician will handle any further medical issues. Please note that NO REFILLS for any discharge medications will be authorized once you are discharged, as it is imperative that you return to your primary care physician (or establish a relationship with a primary care physician if you do not have one) for your aftercare needs so that they can reassess your need for medications and monitor your lab values.    Allergies as of 01/06/2021   No Known Allergies     Medication List    TAKE these medications   aspirin EC 81 MG tablet Take 81 mg by mouth daily.   ezetimibe 10 MG tablet Commonly known as: ZETIA TAKE ONE TABLET BY MOUTH DAILY   famotidine 20 MG tablet Commonly  known as: PEPCID Take 20 mg by mouth daily as needed for heartburn or indigestion.   ketorolac 0.5 % ophthalmic solution Commonly known as: ACULAR Place 1 drop into both eyes 4 (four) times daily.   levothyroxine 50 MCG tablet Commonly known as: SYNTHROID Take 1 tablet (50 mcg total) by mouth daily. What changed: when to take this   losartan 50 MG tablet Commonly known as: COZAAR Take 1.5 tablets (75 mg total) by mouth daily.   metoprolol succinate 25 MG 24 hr tablet Commonly known as: TOPROL-XL Take 1 tablet (25 mg total) by mouth daily.   pravastatin 20 MG tablet Commonly known as: PRAVACHOL Take 1 tablet (20 mg total) by mouth daily.   prednisoLONE acetate 1 % ophthalmic suspension Commonly known as: PRED FORTE Place 1  drop into both eyes 4 (four) times daily.   rOPINIRole 0.25 MG tablet Commonly known as: Requip Take 3 tablets (0.75 mg total) by mouth at bedtime.   tadalafil 20 MG tablet Commonly known as: CIALIS Take 20 mg by mouth daily as needed for erectile dysfunction.       Time coordinating discharge: 35 minutes  The results of significant diagnostics from this hospitalization (including imaging, microbiology, ancillary and laboratory) are listed below for reference.    Procedures and Diagnostic Studies:   CT ABDOMEN PELVIS W CONTRAST  Result Date: 01/04/2021 CLINICAL DATA:  70 year old male with abdominal pain on set overnight. History of melanoma and colon cancer. EXAM: CT ABDOMEN AND PELVIS WITH CONTRAST TECHNIQUE: Multidetector CT imaging of the abdomen and pelvis was performed using the standard protocol following bolus administration of intravenous contrast. CONTRAST:  33mL OMNIPAQUE IOHEXOL 300 MG/ML  SOLN COMPARISON:  CTA chest 05/06/2019. CT Abdomen and Pelvis 04/25/2016, 12/23/2018. FINDINGS: Lower chest: Lower lung volumes with bilateral lower lobe atelectasis superimposed on some scarring along the left major fissure. No pericardial or pleural  effusion. Hepatobiliary: Small volume of perihepatic free fluid with simple fluid density. Liver enhancement is stable since 2017 with small benign caudate cysts suspected on series 2, image 16. Cholelithiasis layering on series 2, image 24. No pericholecystic inflammation or bile duct enlargement. Pancreas: Atrophied. Spleen: Negative aside from a small volume of simple density perisplenic fluid. Adrenals/Urinary Tract: Normal adrenal glands. Chronic right renal scarring. Nonobstructed kidneys with symmetric contrast excretion. Decompressed ureters. Decompressed urinary bladder. Stomach/Bowel: Decompressed rectum. Sigmoid colon anastomosis on series 2, image 59 with no adverse features. Decompressed descending colon. Redundant splenic flexure, transverse colon with retained gas and stool. There is a small to large bowel anastomosis in the right abdomen (coronal image 52) with decompressed distal small bowel. However, there is an abrupt transition point in the right anterior abdomen on series 2, image 44 and coronal image 71 with upstream dilated small bowel with flocculated content (small bowel feces sign). Proximal to that fluid-filled small bowel loops are dilated up to 35 mm diameter throughout the mid jejunum. Distal small bowel is decompressed. Proximal small bowel loops are fluid-filled but have a more normal appearance. The stomach is mildly distended with fluid. The duodenum is decompressed. No free air. Vascular/Lymphatic: Mild Aortoiliac calcified atherosclerosis. Major arterial structures remain patent. Portal venous system appears patent. No lymphadenopathy. Reproductive: Negative. Other: Small volume pelvic free fluid with simple fluid density appears to be tracking from the distal small bowel mesentery. Chronic postoperative changes to the ventral abdominal wall. Musculoskeletal: Stable mild L5 superior endplate compression. No acute or suspicious osseous lesion identified. IMPRESSION: 1. Small Bowel  Obstruction is moderate to severe with a discrete transition point in the anterior right abdomen not far from the small to large bowel anastomosis. Favor adhesions. Small volume of free fluid in the abdomen and pelvis appears reactive. No free air. 2. Cholelithiasis without CT evidence of acute cholecystitis. Lung base atelectasis and scarring. Electronically Signed   By: Genevie Ann M.D.   On: 01/04/2021 07:46   DG Chest Port 1 View  Result Date: 01/04/2021 CLINICAL DATA:  NG tube placement. EXAM: PORTABLE CHEST 1 VIEW COMPARISON:  July 29, 2017. FINDINGS: NG tube courses below the diaphragm and is looped in the expected region of the stomach. Linear left basilar opacities. No visible pleural effusions or pneumothorax on these portable AP radiographs. Similar cardiomediastinal silhouette and aortic valve replacement with median sternotomy. Polyarticular degenerative  change. IMPRESSION: 1. NG tube courses below the diaphragm and is looped in the expected region of the stomach. 2. Linear left basilar opacities, favor atelectasis. Electronically Signed   By: Margaretha Sheffield MD   On: 01/04/2021 08:54   DG Abd Portable 1V  Result Date: 01/04/2021 CLINICAL DATA:  Bowel obstruction. Post Gastrografin administration. EXAM: PORTABLE ABDOMEN - 1 VIEW COMPARISON:  Radiograph earlier today. FINDINGS: Tip and side port of the enteric tube below the diaphragm in the stomach. Administered enteric contrast is seen within the stomach and throughout small bowel loops. Mild small bowel dilatation again seen. No definite contrast in the colon. Small volume of colonic stool. Excreted IV contrast within the urinary bladder from CT earlier today. IMPRESSION: Administered enteric contrast within the stomach and small bowel, no definite contrast in the colon. Persistent small bowel dilatation. Electronically Signed   By: Keith Rake M.D.   On: 01/04/2021 23:45   DG Abd Portable 1V-Small Bowel Obstruction Protocol-initial,  8 hr delay  Result Date: 01/04/2021 CLINICAL DATA:  Small bowel obstruction. EXAM: PORTABLE ABDOMEN - 1 VIEW COMPARISON:  CT abdomen and pelvis 01/04/2021. FINDINGS: New NG tube is in place with the tip and side-port in the stomach. Scattered dilated loops of small bowel measuring up to 3.5 cm are seen but better demonstrated on the prior CT. Gas and stool in the ascending and transverse colon are unchanged. There is some contrast material in the patient's urinary bladder from his CT scan. IMPRESSION: No change in small bowel dilatation compatible with obstruction as seen on prior CT. NG tube in good position. Electronically Signed   By: Inge Rise M.D.   On: 01/04/2021 12:57     Labs:   Basic Metabolic Panel: Recent Labs  Lab 01/04/21 0526 01/05/21 0134 01/06/21 0110  NA 137 139 137  K 4.6 4.9 4.1  CL 100 102 102  CO2 29 29 24   GLUCOSE 126* 100* 73  BUN 15 13 13   CREATININE 1.09 1.17 1.08  CALCIUM 9.6 9.3 8.6*  MG  --  2.2  --   PHOS  --  4.6  --    GFR Estimated Creatinine Clearance: 60.9 mL/min (by C-G formula based on SCr of 1.08 mg/dL). Liver Function Tests: Recent Labs  Lab 01/04/21 0526  AST 21  ALT 26  ALKPHOS 51  BILITOT 0.7  PROT 6.7  ALBUMIN 4.3   Recent Labs  Lab 01/04/21 0526  LIPASE 10*   No results for input(s): AMMONIA in the last 168 hours. Coagulation profile No results for input(s): INR, PROTIME in the last 168 hours.  CBC: Recent Labs  Lab 01/04/21 0526 01/05/21 0134 01/06/21 0110  WBC 9.2 8.4 6.4  NEUTROABS 7.6  --  4.9  HGB 16.8 16.2 14.4  HCT 48.9 47.8 42.2  MCV 96.6 97.6 98.1  PLT 184 202 165   Cardiac Enzymes: No results for input(s): CKTOTAL, CKMB, CKMBINDEX, TROPONINI in the last 168 hours. BNP: Invalid input(s): POCBNP CBG: No results for input(s): GLUCAP in the last 168 hours. D-Dimer No results for input(s): DDIMER in the last 72 hours. Hgb A1c No results for input(s): HGBA1C in the last 72 hours. Lipid  Profile No results for input(s): CHOL, HDL, LDLCALC, TRIG, CHOLHDL, LDLDIRECT in the last 72 hours. Thyroid function studies No results for input(s): TSH, T4TOTAL, T3FREE, THYROIDAB in the last 72 hours.  Invalid input(s): FREET3 Anemia work up No results for input(s): VITAMINB12, FOLATE, FERRITIN, TIBC, IRON, RETICCTPCT in the last  72 hours. Microbiology Recent Results (from the past 240 hour(s))  Resp Panel by RT-PCR (Flu A&B, Covid) Nasopharyngeal Swab     Status: None   Collection Time: 01/04/21  8:42 AM   Specimen: Nasopharyngeal Swab; Nasopharyngeal(NP) swabs in vial transport medium  Result Value Ref Range Status   SARS Coronavirus 2 by RT PCR NEGATIVE NEGATIVE Final    Comment: (NOTE) SARS-CoV-2 target nucleic acids are NOT DETECTED.  The SARS-CoV-2 RNA is generally detectable in upper respiratory specimens during the acute phase of infection. The lowest concentration of SARS-CoV-2 viral copies this assay can detect is 138 copies/mL. A negative result does not preclude SARS-Cov-2 infection and should not be used as the sole basis for treatment or other patient management decisions. A negative result may occur with  improper specimen collection/handling, submission of specimen other than nasopharyngeal swab, presence of viral mutation(s) within the areas targeted by this assay, and inadequate number of viral copies(<138 copies/mL). A negative result must be combined with clinical observations, patient history, and epidemiological information. The expected result is Negative.  Fact Sheet for Patients:  EntrepreneurPulse.com.au  Fact Sheet for Healthcare Providers:  IncredibleEmployment.be  This test is no t yet approved or cleared by the [place] FDA and  has been authorized for detection and/or diagnosis of SARS-CoV-2 by FDA under an Emergency Use Authorization (EUA). This EUA will remain  in effect (meaning this test can be used)  for the duration of the COVID-19 declaration under Section 564(b)(1) of the Act, 21 U.S.C.section 360bbb-3(b)(1), unless the authorization is terminated  or revoked sooner.       Influenza A by PCR NEGATIVE NEGATIVE Final   Influenza B by PCR NEGATIVE NEGATIVE Final    Comment: (NOTE) The Xpert Xpress SARS-CoV-2/FLU/RSV plus assay is intended as an aid in the diagnosis of influenza from Nasopharyngeal swab specimens and should not be used as a sole basis for treatment. Nasal washings and aspirates are unacceptable for Xpert Xpress SARS-CoV-2/FLU/RSV testing.  Fact Sheet for Patients: EntrepreneurPulse.com.au  Fact Sheet for Healthcare Providers: IncredibleEmployment.be  This test is not yet approved or cleared by the [place] FDA and has been authorized for detection and/or diagnosis of SARS-CoV-2 by FDA under an Emergency Use Authorization (EUA). This EUA will remain in effect (meaning this test can be used) for the duration of the COVID-19 declaration under Section 564(b)(1) of the Act, 21 U.S.C. section 360bbb-3(b)(1), unless the authorization is terminated or revoked.  Performed at [facility]      Signed: Terrilee Croak  [facility] 01/06/2021, 2:55 PM

## 2021-01-06 NOTE — Progress Notes (Signed)
Discharge instructions reviewed with pt and pt's wife. Pt and pt's wife verbalized understanding and had no questions.  Pt discharged in stable condition with wife.

## 2021-01-06 NOTE — Progress Notes (Addendum)
PROGRESS NOTE  [name] Brown  DOB: Jun 13, 1951  PCP: [name], MD HBZ:[id number]  DOA: 01/04/2021  LOS: 2 days   Chief Complaint  Patient presents with  . Abdominal Pain   Brief narrative: [name] Brown is a 70 y.o. male with PMH significant for HTN, HLD, A. fib following AVR, PVCs, AAA, hypothyroidism, bowel perforation requiring sigmoidectomy and colon cancer s/p right hemicolectomy, OSA on CPAP. Patient presented to the ED on 4/25 with complaint of right upper quadrant abdominal pain, waxing and waning for 3 days.  Last bowel movement 2 days ago, Patient has a history of perforated sigmoid colon in 04/2016 requiring exploratory laparotomy with sigmoid colectomy and descending colostomy.  He subsequently underwent a colonoscopy with Dr. 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  In the ED, patient was hemodynamically stable Labs unremarkable CT of the abdomen pelvis with contrast significant for moderate to severe small bowel obstruction with transition point in the anterior right abdomen near small to large bowel anastomosis for which adhesions was favored. Patient was given Zofran, morphine, diluadid, and zofran. Admitted to hospitalist service General surgery consultation obtained.  Subjective: Patient was seen and examined this morning.  Sitting up in bed.  Not in distress.  Had BM this morning.   General surgery follow-up appreciated.  NG tube clamped.  Taking breakfast.   Labs unremarkable.  Assessment/Plan: Small bowel obstruction -Presented with abdominal pain, no bowel movement, no flatus. -CT imaging significant for moderate to severe small bowel obstruction with transition point in the right upper quadrant of the abdomen.   -Seems to be improving with conservative management.  Had BM this morning.   General surgery follow-up appreciated.  NG  tube clamped.  Taking breakfast.   -Home this afternoon versus tomorrow. Recent Labs  Lab 01/04/21 0526 01/05/21 0134 01/06/21 0110  K 4.6 4.9 4.1  MG  --  2.2  --   PHOS  --  4.6  --    Essential hypertension -home meds include metoprolol succinate 25 mg daily, losartan 75 mg daily. -They were on hold.  I would resume this morning.  Paroxysmal A. fib after AVR -Resume metoprolol. Not on anticoagulation  Hyperlipidemia -Resume Zetia 10 mg daily and pravastatin 20 mg daily post discharge.  Chronic alcohol use -Patient drinks 3 glasses of wine daily, last drink on 4/21 -No withdrawal symptoms in the hospital.  Hypothyroidism -Continue Synthroid 50 mcg daily.  GERD -Continue Pepcid  Macular degeneration -Continue home eyedrops  OSA -On CPAP at home.   History of bowel perforation and colon cancer -Patient with prior history of perforated sigmoid colon in 04/2016 requiring exploratory laparotomy with sigmoid colectomy and descending colostomy.  He subsequently underwent a colonoscopy with Dr. 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  Patient underwent right hemicolectomy 08/2016.  Mobility: Encourage ambulation seen walking on the hallway this morning. Code Status:   Code Status: Full Code  Nutritional status: Body mass index is 28.12 kg/m.     Diet Order            Diet clear liquid Room service appropriate? Yes; Fluid consistency: Thin  Diet effective now                 DVT prophylaxis: enoxaparin (LOVENOX) injection 40 mg Start: 01/04/21 1115   Antimicrobials:  None Fluid: Normal saline to continue at a lower rate of 50 mill per hour Consultants: General surgery Family Communication:  None at bedside  Status is: Inpatient  Remains inpatient appropriate because: Resolving bowel obstruction  Dispo: The patient is from: Home              Anticipated d/c is to: Home this  afternoon versus tomorrow              Patient currently is not medically stable to d/c.   Difficult to place patient No     Infusions:  . sodium chloride 75 mL/hr at 01/06/21 0700  . famotidine (PEPCID) IV Stopped (01/05/21 1222)    Scheduled Meds: . enoxaparin (LOVENOX) injection  40 mg Subcutaneous Daily  . ezetimibe  10 mg Oral Daily  . ketorolac  1 drop Both Eyes QID  . [START ON 01/07/2021] levothyroxine  50 mcg Oral QAC breakfast  . losartan  75 mg Oral Daily  . metoprolol succinate  25 mg Oral Daily  . pravastatin  20 mg Oral Daily  . prednisoLONE acetate  1 drop Both Eyes QID  . rOPINIRole  0.75 mg Oral QHS  . sodium chloride flush  3 mL Intravenous Q12H  . thiamine  100 mg Oral Daily   Or  . thiamine  100 mg Intravenous Daily    Antimicrobials: Anti-infectives (From admission, onward)   None      PRN meds: acetaminophen **OR** acetaminophen, albuterol, famotidine, hydrALAZINE, HYDROmorphone (DILAUDID) injection, ketorolac, LORazepam **OR** LORazepam, ondansetron **OR** ondansetron (ZOFRAN) IV, phenol, sodium chloride   Objective: Vitals:   01/05/21 2029 01/06/21 0557  BP: (!) 142/86 (!) 146/77  Pulse: 74 72  Resp: 17 18  Temp: 98.4 F (36.9 C) 98.5 F (36.9 C)  SpO2: 100% 96%    Intake/Output Summary (Last 24 hours) at 01/06/2021 0954 Last data filed at 01/06/2021 0820 Gross per 24 hour  Intake 1977.19 ml  Output 300 ml  Net 1677.19 ml   Filed Weights   01/04/21 0524  Weight: 76.7 kg   Weight change:  Body mass index is 28.12 kg/m.   Physical Exam: General exam: Pleasant, elderly Caucasian male.  Not in distress. Skin: No rashes, lesions or ulcers. HEENT: Atraumatic, normocephalic, no obvious bleeding.  NG tube clamped Lungs: Clear to auscultation bilaterally CVS: Regular rate and rhythm, no murmur GI/Abd soft, nontender, nondistended, bowel sound present CNS: Alert, awake, oriented x3 Psychiatry: Mood appropriate Extremities: No pedal  edema, no calf tenderness  Data Review: I have personally reviewed the laboratory data and studies available.  Recent Labs  Lab 01/04/21 0526 01/05/21 0134 01/06/21 0110  WBC 9.2 8.4 6.4  NEUTROABS 7.6  --  4.9  HGB 16.8 16.2 14.4  HCT 48.9 47.8 42.2  MCV 96.6 97.6 98.1  PLT 184 202 165   Recent Labs  Lab 01/04/21 0526 01/05/21 0134 01/06/21 0110  NA 137 139 137  K 4.6 4.9 4.1  CL 100 102 102  CO2 29 29 24   GLUCOSE 126* 100* 73  BUN 15 13 13   CREATININE 1.09 1.17 1.08  CALCIUM 9.6 9.3 8.6*  MG  --  2.2  --   PHOS  --  4.6  --     F/u labs ordered Unresulted Labs (From admission, onward)          Start     Ordered   01/06/21 0500  CBC with Differential/Platelet  Daily,   R      01/05/21  1450   01/06/21 1751  Basic metabolic panel  Daily,   R      01/05/21 1450          Signed, Terrilee Croak, MD [facility] 01/06/2021

## 2021-01-07 ENCOUNTER — Telehealth: Payer: Self-pay

## 2021-01-07 NOTE — Telephone Encounter (Signed)
Transition Care Management Follow-up Telephone Call  Date of discharge and from where: 01/06/2021-[place]  How have you been since you were released from the hospital? Doing fine. Feeling much better  Any questions or concerns? No  Items Reviewed:  Did the pt receive and understand the discharge instructions provided? Yes   Medications obtained and verified? Yes   Other? Yes   Any new allergies since your discharge? No   Dietary orders reviewed? Yes  Do you have support at home? Yes   Home Care and Equipment/Supplies: Were home health services ordered? no If so, what is the name of the agency? n/a Has the agency set up a time to come to the patient's home? not applicable Were any new equipment or medical supplies ordered?  No What is the name of the medical supply agency? n/a Were you able to get the supplies/equipment? not applicable Do you have any questions related to the use of the equipment or supplies?n/a  Functional Questionnaire: (I = Independent and D = Dependent) ADLs: I  Bathing/Dressing- I  Meal Prep- I  Eating- I  Maintaining continence- I  Transferring/Ambulation- I  Managing Meds- I  Follow up appointments reviewed:   PCP Hospital f/u appt confirmed? Yes  Scheduled to see Dr. Birdie Riddle on 01/12/21 @ 11:00.  [place] f/u appt confirmed? n/a   Are transportation arrangements needed? No   If their condition worsens, is the pt aware to call PCP or go to the Emergency Dept.? Yes  Was the patient provided with contact information for the PCP's office or ED? Yes  Was to pt encouraged to call back with questions or concerns? Yes

## 2021-01-12 ENCOUNTER — Ambulatory Visit (INDEPENDENT_AMBULATORY_CARE_PROVIDER_SITE_OTHER): Payer: Medicare Other | Admitting: Family Medicine

## 2021-01-12 ENCOUNTER — Other Ambulatory Visit: Payer: Self-pay

## 2021-01-12 ENCOUNTER — Encounter: Payer: Self-pay | Admitting: Family Medicine

## 2021-01-12 VITALS — BP 132/88 | HR 65 | Temp 97.7°F | Resp 18 | Ht 65.0 in | Wt 172.2 lb

## 2021-01-12 DIAGNOSIS — Z8719 Personal history of other diseases of the digestive system: Secondary | ICD-10-CM | POA: Diagnosis not present

## 2021-01-12 DIAGNOSIS — K56609 Unspecified intestinal obstruction, unspecified as to partial versus complete obstruction: Secondary | ICD-10-CM

## 2021-01-12 NOTE — Assessment & Plan Note (Signed)
Resolved.  Pt reports his pain is gone, he is able to eat and drink normally, and BMs are regular.  He is worried about the possibility of recurrence.  Told him that unfortunately we have no way to predict that.  No need for repeat labs or imaging today.  Applauded pt for his ability to recognize that something was wrong and to continue paying attention to his body.

## 2021-01-12 NOTE — Patient Instructions (Signed)
Follow up as needed or as scheduled No need for labs today- YAY!!! Call with any questions or concerns Safe Travels and Have Fun!!!

## 2021-01-12 NOTE — Progress Notes (Signed)
   Subjective:    Patient ID: [name] Brown, male    DOB: 28-Nov-1950, 70 y.o.   MRN: [id number]  [facility] f/u- pt was admitted 4/25-4/27 w/ SBO.  CT showed moderate to severe small bowel obstruction w/ transition point near small to large bowel anastomosis consistent w/ adhesions.  He was admitted, NG tube was placed, and surgery was consulted  Thankfully sxs improved w/ conservative management.  No surgery was required.  No change in medication.  Pt reports feeling well at this time.  Did feel weak for a few days after d/c due to lack of food.  He is now able to eat/drink regularly.  Pt is having regular BMs.  Not having any pain.  No discussion of elective LOA to prevent recurrence.    Reviewed ER notes, H&P, D/C summary, labs, images.   Review of Systems For ROS see HPI   This visit occurred during the SARS-CoV-2 public health emergency.  Safety protocols were in place, including screening questions prior to the visit, additional usage of staff PPE, and extensive cleaning of exam room while observing appropriate contact time as indicated for disinfecting solutions.       Objective:   Physical Exam Vitals reviewed.  Constitutional:      General: He is not in acute distress.    Appearance: Normal appearance. He is not ill-appearing.  HENT:     Head: Normocephalic and atraumatic.  Cardiovascular:     Pulses: Normal pulses.  Pulmonary:     Effort: Pulmonary effort is normal.     Breath sounds: Normal breath sounds.  Abdominal:     General: Abdomen is flat. There is no distension.     Palpations: Abdomen is soft.     Tenderness: There is no abdominal tenderness. There is no guarding.  Skin:    General: Skin is warm and dry.  Neurological:     General: No focal deficit present.     Mental Status: He is alert and oriented to person, place, and time.  Psychiatric:        Mood and Affect: Mood normal.        Behavior: Behavior normal.        Thought Content: Thought content  normal.           Assessment & Plan:

## 2021-01-14 DIAGNOSIS — H35353 Cystoid macular degeneration, bilateral: Secondary | ICD-10-CM | POA: Diagnosis not present

## 2021-01-14 DIAGNOSIS — H43813 Vitreous degeneration, bilateral: Secondary | ICD-10-CM | POA: Diagnosis not present

## 2021-01-14 DIAGNOSIS — H43393 Other vitreous opacities, bilateral: Secondary | ICD-10-CM | POA: Diagnosis not present

## 2021-01-14 DIAGNOSIS — H33302 Unspecified retinal break, left eye: Secondary | ICD-10-CM | POA: Diagnosis not present

## 2021-01-19 ENCOUNTER — Other Ambulatory Visit: Payer: Self-pay

## 2021-01-19 DIAGNOSIS — H4321 Crystalline deposits in vitreous body, right eye: Secondary | ICD-10-CM | POA: Diagnosis not present

## 2021-01-19 DIAGNOSIS — H33302 Unspecified retinal break, left eye: Secondary | ICD-10-CM | POA: Diagnosis not present

## 2021-01-19 DIAGNOSIS — H4311 Vitreous hemorrhage, right eye: Secondary | ICD-10-CM | POA: Diagnosis not present

## 2021-01-19 DIAGNOSIS — H43813 Vitreous degeneration, bilateral: Secondary | ICD-10-CM | POA: Diagnosis not present

## 2021-02-01 ENCOUNTER — Other Ambulatory Visit: Payer: Self-pay

## 2021-02-01 ENCOUNTER — Encounter (INDEPENDENT_AMBULATORY_CARE_PROVIDER_SITE_OTHER): Payer: Medicare Other | Admitting: Ophthalmology

## 2021-02-01 DIAGNOSIS — H33302 Unspecified retinal break, left eye: Secondary | ICD-10-CM | POA: Diagnosis not present

## 2021-02-01 DIAGNOSIS — H59033 Cystoid macular edema following cataract surgery, bilateral: Secondary | ICD-10-CM | POA: Diagnosis not present

## 2021-02-01 DIAGNOSIS — H35033 Hypertensive retinopathy, bilateral: Secondary | ICD-10-CM | POA: Diagnosis not present

## 2021-02-01 DIAGNOSIS — H43813 Vitreous degeneration, bilateral: Secondary | ICD-10-CM | POA: Diagnosis not present

## 2021-02-01 DIAGNOSIS — I10 Essential (primary) hypertension: Secondary | ICD-10-CM

## 2021-02-09 DIAGNOSIS — Z85820 Personal history of malignant melanoma of skin: Secondary | ICD-10-CM | POA: Diagnosis not present

## 2021-02-09 DIAGNOSIS — L309 Dermatitis, unspecified: Secondary | ICD-10-CM | POA: Diagnosis not present

## 2021-02-09 DIAGNOSIS — D225 Melanocytic nevi of trunk: Secondary | ICD-10-CM | POA: Diagnosis not present

## 2021-02-09 DIAGNOSIS — D2272 Melanocytic nevi of left lower limb, including hip: Secondary | ICD-10-CM | POA: Diagnosis not present

## 2021-02-09 DIAGNOSIS — D2261 Melanocytic nevi of right upper limb, including shoulder: Secondary | ICD-10-CM | POA: Diagnosis not present

## 2021-02-09 DIAGNOSIS — L821 Other seborrheic keratosis: Secondary | ICD-10-CM | POA: Diagnosis not present

## 2021-02-12 ENCOUNTER — Ambulatory Visit: Payer: Medicare Other | Admitting: Cardiovascular Disease

## 2021-02-25 ENCOUNTER — Other Ambulatory Visit: Payer: Self-pay | Admitting: Cardiovascular Disease

## 2021-03-10 ENCOUNTER — Encounter: Payer: Self-pay | Admitting: *Deleted

## 2021-03-15 DIAGNOSIS — Z20822 Contact with and (suspected) exposure to covid-19: Secondary | ICD-10-CM | POA: Diagnosis not present

## 2021-03-18 ENCOUNTER — Encounter (INDEPENDENT_AMBULATORY_CARE_PROVIDER_SITE_OTHER): Payer: Medicare Other | Admitting: Ophthalmology

## 2021-03-18 ENCOUNTER — Other Ambulatory Visit: Payer: Self-pay

## 2021-03-18 DIAGNOSIS — I10 Essential (primary) hypertension: Secondary | ICD-10-CM | POA: Diagnosis not present

## 2021-03-18 DIAGNOSIS — H43813 Vitreous degeneration, bilateral: Secondary | ICD-10-CM | POA: Diagnosis not present

## 2021-03-18 DIAGNOSIS — H35033 Hypertensive retinopathy, bilateral: Secondary | ICD-10-CM

## 2021-03-18 DIAGNOSIS — H33302 Unspecified retinal break, left eye: Secondary | ICD-10-CM

## 2021-03-18 DIAGNOSIS — H59033 Cystoid macular edema following cataract surgery, bilateral: Secondary | ICD-10-CM | POA: Diagnosis not present

## 2021-03-19 ENCOUNTER — Telehealth: Payer: Self-pay | Admitting: *Deleted

## 2021-03-19 NOTE — Chronic Care Management (AMB) (Signed)
  Chronic Care Management   Note  03/19/2021 Name: [name] Brown MRN: [id number] DOB: 10/16/1950  [name] Brown is a 70 y.o. year old male who is a primary care patient of Birdie Riddle, Aundra Millet, MD. I reached out to [name] by phone today in response to a referral sent by [name] PCP, [name], MD      [name] was given information about Chronic Care Management services today including:  CCM service includes personalized support from designated clinical staff supervised by his physician, including individualized plan of care and coordination with other care providers 24/7 contact phone numbers for assistance for urgent and routine care needs. Service will only be billed when office clinical staff spend 20 minutes or more in a month to coordinate care. Only one practitioner may furnish and bill the service in a calendar month. The patient may stop CCM services at any time (effective at the end of the month) by phone call to the office staff. The patient will be responsible for cost sharing (co-pay) of up to 20% of the service fee (after annual deductible is met).  Patient did not agree to enrollment in care management services and does not wish to consider at this time.  Follow up plan: Patient declines further follow up and engagement by the care management team. Appropriate care team members and provider have been notified via electronic communication. The care management team is available to follow up with the patient after provider conversation with the patient regarding recommendation for care management engagement and subsequent re-referral to the care management team.   [place], [place] [id number]

## 2021-04-13 DIAGNOSIS — Z20822 Contact with and (suspected) exposure to covid-19: Secondary | ICD-10-CM | POA: Diagnosis not present

## 2021-04-15 DIAGNOSIS — Z85038 Personal history of other malignant neoplasm of large intestine: Secondary | ICD-10-CM | POA: Diagnosis not present

## 2021-04-15 DIAGNOSIS — R933 Abnormal findings on diagnostic imaging of other parts of digestive tract: Secondary | ICD-10-CM | POA: Diagnosis not present

## 2021-04-29 ENCOUNTER — Encounter (INDEPENDENT_AMBULATORY_CARE_PROVIDER_SITE_OTHER): Payer: Medicare Other | Admitting: Ophthalmology

## 2021-04-29 ENCOUNTER — Other Ambulatory Visit: Payer: Self-pay

## 2021-04-29 DIAGNOSIS — H35372 Puckering of macula, left eye: Secondary | ICD-10-CM | POA: Diagnosis not present

## 2021-04-29 DIAGNOSIS — H33302 Unspecified retinal break, left eye: Secondary | ICD-10-CM | POA: Diagnosis not present

## 2021-04-29 DIAGNOSIS — I10 Essential (primary) hypertension: Secondary | ICD-10-CM

## 2021-04-29 DIAGNOSIS — H43813 Vitreous degeneration, bilateral: Secondary | ICD-10-CM

## 2021-04-29 DIAGNOSIS — H35033 Hypertensive retinopathy, bilateral: Secondary | ICD-10-CM | POA: Diagnosis not present

## 2021-04-29 DIAGNOSIS — H59033 Cystoid macular edema following cataract surgery, bilateral: Secondary | ICD-10-CM

## 2021-05-01 ENCOUNTER — Other Ambulatory Visit: Payer: Self-pay | Admitting: Family Medicine

## 2021-05-01 DIAGNOSIS — E039 Hypothyroidism, unspecified: Secondary | ICD-10-CM

## 2021-05-06 DIAGNOSIS — H35353 Cystoid macular degeneration, bilateral: Secondary | ICD-10-CM | POA: Diagnosis not present

## 2021-05-06 DIAGNOSIS — Z961 Presence of intraocular lens: Secondary | ICD-10-CM | POA: Diagnosis not present

## 2021-05-18 DIAGNOSIS — H35353 Cystoid macular degeneration, bilateral: Secondary | ICD-10-CM | POA: Diagnosis not present

## 2021-05-18 DIAGNOSIS — H1820 Unspecified corneal edema: Secondary | ICD-10-CM | POA: Diagnosis not present

## 2021-06-01 DIAGNOSIS — H04123 Dry eye syndrome of bilateral lacrimal glands: Secondary | ICD-10-CM | POA: Diagnosis not present

## 2021-06-22 DIAGNOSIS — H04123 Dry eye syndrome of bilateral lacrimal glands: Secondary | ICD-10-CM | POA: Diagnosis not present

## 2021-06-28 DIAGNOSIS — Z980 Intestinal bypass and anastomosis status: Secondary | ICD-10-CM | POA: Diagnosis not present

## 2021-06-28 DIAGNOSIS — Z85038 Personal history of other malignant neoplasm of large intestine: Secondary | ICD-10-CM | POA: Diagnosis not present

## 2021-06-28 DIAGNOSIS — Z86010 Personal history of colonic polyps: Secondary | ICD-10-CM | POA: Diagnosis not present

## 2021-06-28 DIAGNOSIS — K649 Unspecified hemorrhoids: Secondary | ICD-10-CM | POA: Diagnosis not present

## 2021-06-28 LAB — HM COLONOSCOPY

## 2021-07-02 ENCOUNTER — Encounter: Payer: Self-pay | Admitting: Family Medicine

## 2021-07-02 DIAGNOSIS — Z23 Encounter for immunization: Secondary | ICD-10-CM | POA: Diagnosis not present

## 2021-07-11 DIAGNOSIS — Z23 Encounter for immunization: Secondary | ICD-10-CM | POA: Diagnosis not present

## 2021-07-26 ENCOUNTER — Ambulatory Visit (INDEPENDENT_AMBULATORY_CARE_PROVIDER_SITE_OTHER): Payer: Medicare Other | Admitting: Cardiovascular Disease

## 2021-07-26 ENCOUNTER — Encounter (HOSPITAL_BASED_OUTPATIENT_CLINIC_OR_DEPARTMENT_OTHER): Payer: Self-pay | Admitting: Cardiovascular Disease

## 2021-07-26 ENCOUNTER — Ambulatory Visit (HOSPITAL_BASED_OUTPATIENT_CLINIC_OR_DEPARTMENT_OTHER): Payer: Medicare Other | Admitting: Cardiovascular Disease

## 2021-07-26 ENCOUNTER — Other Ambulatory Visit: Payer: Self-pay

## 2021-07-26 VITALS — BP 148/90 | HR 58 | Ht 65.0 in | Wt 174.4 lb

## 2021-07-26 DIAGNOSIS — E7849 Other hyperlipidemia: Secondary | ICD-10-CM | POA: Diagnosis not present

## 2021-07-26 DIAGNOSIS — I2510 Atherosclerotic heart disease of native coronary artery without angina pectoris: Secondary | ICD-10-CM | POA: Diagnosis not present

## 2021-07-26 DIAGNOSIS — Z5181 Encounter for therapeutic drug level monitoring: Secondary | ICD-10-CM | POA: Diagnosis not present

## 2021-07-26 DIAGNOSIS — I10 Essential (primary) hypertension: Secondary | ICD-10-CM

## 2021-07-26 DIAGNOSIS — I480 Paroxysmal atrial fibrillation: Secondary | ICD-10-CM | POA: Diagnosis not present

## 2021-07-26 DIAGNOSIS — I493 Ventricular premature depolarization: Secondary | ICD-10-CM | POA: Diagnosis not present

## 2021-07-26 DIAGNOSIS — Z952 Presence of prosthetic heart valve: Secondary | ICD-10-CM

## 2021-07-26 MED ORDER — METOPROLOL SUCCINATE ER 25 MG PO TB24: 25.0000 mg | ORAL_TABLET | Freq: Every day | ORAL | 3 refills | Status: DC

## 2021-07-26 MED ORDER — LOSARTAN POTASSIUM 100 MG PO TABS
100.0000 mg | ORAL_TABLET | Freq: Every day | ORAL | 1 refills | Status: DC
Start: 2021-07-26 — End: 2022-02-02

## 2021-07-26 NOTE — Progress Notes (Signed)
Cardiology Office Note   Date:  07/26/2021   ID:  [name] Brown, DOB 1950-12-20, MRN [id number]  PCP:  [name] Minium, MD  Cardiologist:  [name] Latch, MD  Electrophysiologist:  None   Evaluation Performed:  Follow-Up Visit  Chief Complaint:  Follow up  History of Present Illness:    [name] Brown is a 70 y.o. male with non-obstructive CAD, chronic systolic and diastolic heart failure (LVEF 30-35% improved to 60-65%%), aortic stenosis s/p bioprosthetic aortic valve,  and colon cancer s/p R colectomy who presents for follow up.  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  Dr. Gwyneth Brown saw Dr. Annye Brown on 09/23/16 and was noted to have coronary atherosclerosis on chest xray.  He had an echo 10/12/16 that revealed LVEF 30-35% with grade 1 diastolic dysfunction.  His aortic valve was heavily calcified and likely bicuspid.  Although the mean gradient was 14 mmHg, it was felt that he likely had moderate aortic stenosis given his reduced systolic function and leaflet restriction.   He was started on aspirin and referred to cardiology for evaluation.  He was referred for cardiac catheterization 10/2016 that revealed mild-mod CAD and LVEF 45-50%. [name] was referred for a repeat echo 01/17/17 that revealed LVEF 30-35% with grade 1 diastolic dysfunction. There was fusion of the right and noncoronary cusps and leaflet mobility was severely restricted. The mean gradient was 16 mmHg.    He had a dobutamine stress echo where his gradient increased to 38 mmHg with dobutamine infusion.  He underwent AVR with a 51mm Edwards Magna-Ease pericardial valve 06/08/17.  His hospitalization was complicated by atrial fibrillation and he was started on amiodarone which was discontinued 3 months post-op.  [name] had a repeat echocardiogram 03/2019 that revealed LVEF 60 to 65%.  The mean gradient across his aortic valve was 13 mmHg.  There has been concern by  echo for an ascending aortic aneurysm up to 4.3 cm.  He was referred for CTA that revealed his ascending aorta was 3.6 cm.  [name] was treated for colon cancer with surgery only and did not require chemotherapy.  He was admitted to the hospital 12/2020 with a small bowel obstruction.  He improved with NG tube placement and conservative management.  Lately he has been well.  He continues to exercise 3 to 4 days/week.  He does at least 25 minutes of cardio on the elliptical and treadmill.  He has no exertional chest pain or shortness of breath.  His blood pressures been in the 110s to 140s over 60s to 80s.  His heart rate is in the 40s to 80s.  He has been struggling with his vision.  He had macular edema and retinal damage after cataract surgery.  He has diminished vision in both eyes and floaters floaters.  He is still able to drive but has difficulty with up close vision.   Past Medical History:  Diagnosis Date   Aortic stenosis    Ascending aortic aneurysm 09/18/2018   Colon cancer [facility])    Family history of adverse reaction to anesthesia    pt states his mother had 2 episodes of hyponatremia following anesthesia    GERD (gastroesophageal reflux disease)    Headache    Heart murmur    Hyperlipidemia 10/25/2016   Hypertension    pt is currently not taking any medications; pt states is borderline   Melanoma (HCC)    OSA on CPAP  09/18/2018   PAF (paroxysmal atrial fibrillation) ([place]) 06/13/2017   Perforated sigmoid colon (HCC)    PVC's (premature ventricular contractions) 10/25/2016   Tinnitus    Wears glasses    Past Surgical History:  Procedure Laterality Date   AORTIC VALVE REPLACEMENT N/A 06/08/2017   Procedure: AORTIC VALVE REPLACEMENT (AVR);  Surgeon: [name], MD;  Location: [place];  Service: Open Heart Surgery;  Laterality: N/A;  Using 53mm Perimount Magna Ease Pericardial Bioprosthesis Aortic Valve   COLOSTOMY CLOSURE N/A 08/18/2016   Procedure: COLOSTOMY CLOSURE OPEN PROCEDURE;   Surgeon: Armandina Gemma, MD;  Location: [facility];  Service: General;  Laterality: N/A;   LAPAROTOMY N/A 04/25/2016   Procedure: EXPLORATORY LAPAROTOMY WITH SIGMOID COLECTOMY, COLOSTOMY;  Surgeon: Armandina Gemma, MD;  Location: [facility];  Service: General;  Laterality: N/A;   LEFT HEART CATH AND CORONARY ANGIOGRAPHY N/A 11/03/2016   Procedure: Left Heart Cath and Coronary Angiography;  Surgeon: Burnell Blanks, MD;  Location: [place] [facility];  Service: Cardiovascular;  Laterality: N/A;   MELANOMA EXCISION     PARTIAL COLECTOMY Right 08/18/2016   Procedure: RIGHT COLECTOMY;  Surgeon: Armandina Gemma, MD;  Location: [facility];  Service: General;  Laterality: Right;   TEE WITHOUT CARDIOVERSION N/A 06/08/2017   Procedure: TRANSESOPHAGEAL ECHOCARDIOGRAM (TEE);  Surgeon: [name], MD;  Location: [place];  Service: Open Heart Surgery;  Laterality: N/A;       Current Outpatient Medications:    aspirin EC 81 MG tablet, Take 81 mg by mouth daily., Disp: , Rfl:    ezetimibe (ZETIA) 10 MG tablet, TAKE ONE TABLET BY MOUTH DAILY, Disp: 90 tablet, Rfl: 2   famotidine (PEPCID) 20 MG tablet, Take 20 mg by mouth daily as needed for heartburn or indigestion., Disp: , Rfl:    levothyroxine (SYNTHROID) 50 MCG tablet, TAKE ONE TABLET BY MOUTH DAILY, Disp: 90 tablet, Rfl: 1   pravastatin (PRAVACHOL) 20 MG tablet, Take 1 tablet (20 mg total) by mouth daily., Disp: 90 tablet, Rfl: 3   prednisoLONE acetate (PRED FORTE) 1 % ophthalmic suspension, Place 1 drop into both eyes 4 (four) times daily., Disp: , Rfl:    rOPINIRole (REQUIP) 0.25 MG tablet, Take 3 tablets (0.75 mg total) by mouth at bedtime., Disp: 270 tablet, Rfl: 3   tadalafil (CIALIS) 20 MG tablet, Take 20 mg by mouth daily as needed for erectile dysfunction., Disp: , Rfl:    losartan (COZAAR) 100 MG tablet, Take 1 tablet (100 mg total) by mouth daily., Disp: 90 tablet, Rfl: 1   metoprolol succinate (TOPROL-XL) 25 MG 24 hr tablet, Take 1 tablet (25 mg total) by  mouth daily., Disp: 90 tablet, Rfl: 3  Allergies:   Patient has no known allergies.   Social History   Tobacco Use   Smoking status: Never   Smokeless tobacco: Never  Vaping Use   Vaping Use: Never used  Substance Use Topics   Alcohol use: Yes    Alcohol/week: 21.0 - 24.0 standard drinks    Types: 21 - 24 Standard drinks or equivalent per week    Comment: 2-3 glasses of wine daily    Drug use: No     Family Hx: The patient's family history includes Alcohol abuse in his father; CAD in his mother; Cirrhosis in his father; Dementia in his mother; Heart attack in his father; Kidney disease in his mother; Stroke in his maternal grandmother and paternal grandmother; Vascular Disease in his mother.  ROS:   Please  see the history of present illness.    All other systems reviewed and are negative.   Prior CV studies:   The following studies were reviewed today:  Echo 03/2019: IMPRESSIONS    1. The left ventricle has normal systolic function with an ejection fraction of 60-65%. The cavity size was normal. Left ventricular diastolic Doppler parameters are consistent with impaired relaxation.  2. The right ventricle has normal systolic function. The cavity was normal. There is no increase in right ventricular wall thickness.   SUMMARY   When compared to the prior study from12/19/2019 there is no significant change. Transaortic gradients across the bioprosthetic valve remain within upper normal limit.  Echo 01/17/17: Study Conclusions   - Left ventricle: The cavity size was normal. Wall thickness was   normal. Systolic function was moderately to severely reduced. The   estimated ejection fraction was in the range of 30% to 35%.   Moderate diffuse hypokinesis with no identifiable regional   variations. Doppler parameters are consistent with abnormal left   ventricular relaxation (grade 1 diastolic dysfunction). - Ventricular septum: Septal motion showed paradox. These changes   are  consistent with a left bundle branch block. - Aortic valve: Possibly bicuspid; severely thickened, severely   calcified leaflets; fusion of the right-noncoronary commissure.   Anterior cusp mobility was severely restricted. Transvalvular   velocity was increased, due to low cardiac output. There was   moderate to severe stenosis. There was mild regurgitation. - Right ventricle: Systolic function was mildly reduced.   Impressions:   - Suspect low gradient severe aortic stenosis.   Recommendations:  Consider dobutamine echo.   LHC 11/03/16:   Mid RCA to Dist RCA lesion, 10 %stenosed. RPDA lesion, 10 %stenosed. Ost Cx to Prox Cx lesion, 40 %stenosed. Ost Ramus to Ramus lesion, 20 %stenosed. Mid LAD lesion, 20 %stenosed. Prox LAD lesion, 30 %stenosed. The left ventricular ejection fraction is 45-50% by visual estimate. There is mild left ventricular systolic dysfunction. LV end diastolic pressure is normal. There is no mitral valve regurgitation.  Labs/Other Tests and Data Reviewed:    EKG:  An ECG dated 07/02/2020 was personally reviewed today and demonstrated:  Sinus bradycardia.  Rate 53 bpm.  Prior anteroseptal infarct. 07/26/21: Sinus bradycardia.  Rate 58 bpm.  PVCs.  Cannot rule out prior anteroseptal and inferior infarcts.   Recent Labs: 10/19/2020: TSH 2.07 01/04/2021: ALT 26 01/05/2021: Magnesium 2.2 01/06/2021: BUN 13; Creatinine, Ser 1.08; Hemoglobin 14.4; Platelets 165; Potassium 4.1; Sodium 137   Recent Lipid Panel Lab Results  Component Value Date/Time   CHOL 162 10/19/2020 01:26 PM   CHOL 166 09/01/2020 09:25 AM   TRIG 167.0 (H) 10/19/2020 01:26 PM   HDL 51.10 10/19/2020 01:26 PM   HDL 58 09/01/2020 09:25 AM   CHOLHDL 3 10/19/2020 01:26 PM   LDLCALC 77 10/19/2020 01:26 PM   LDLCALC 92 09/01/2020 09:25 AM   LDLDIRECT 92.0 10/09/2019 11:17 AM    Wt Readings from Last 3 Encounters:  07/26/21 174 lb 6.4 oz (79.1 kg)  01/12/21 172 lb 3.2 oz (78.1 kg)  01/04/21  169 lb (76.7 kg)     Objective:    VS:  BP (!) 148/90 (BP Location: Left Arm, Patient Position: Sitting, Cuff Size: Normal)   Pulse (!) 58   Ht 5\' 5"  (1.651 m)   Wt 174 lb 6.4 oz (79.1 kg)   BMI 29.02 kg/m  , BMI Body mass index is 29.02 kg/m. GENERAL:  Well appearing HEENT: Pupils equal  round and reactive, fundi not visualized, oral mucosa unremarkable NECK:  No jugular venous distention, waveform within normal limits, carotid upstroke brisk and symmetric, no bruits LUNGS:  Clear to auscultation bilaterally HEART:  RRR.  PMI not displaced or sustained,S1 and S2 within normal limits, no S3, no S4, no clicks, no rubs, III/VI systolic murmur ABD:  Flat, positive bowel sounds normal in frequency in pitch, no bruits, no rebound, no guarding, no midline pulsatile mass, no hepatomegaly, no splenomegaly EXT:  2 plus pulses throughout, no edema, no cyanosis no clubbing SKIN:  No rashes no nodules NEURO:  Cranial nerves II through XII grossly intact, motor grossly intact throughout PSYCH:  Cognitively intact, oriented to person place and time   ASSESSMENT & PLAN:    # Chronic systolic and diastolic heart failure:   LVEF has increased to 60 to 65% status post aortic valve replacement.  He is doing well and remains euvolemic.  # Postoperative atrial fibrillation: No recurrent atrial fibrillation.  He is not on anticoagulation chronically.   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  No further investigation needed.  He has no significant CAD as noted on left heart catheterization prior to/2018.   # Non-obstructive CAD:  # Hyperlipidemia:  He had elevated LFTs with rosuvastatin.    Continue pravastatin.  EKG shows prior anteroseptal and inferior infarct.  We discussed the fact that this is an accurate given his minimal CAD on cath.  # Mild-moderate AS:   # Bicuspid aortic valve s/p bioprosthetic AVR: Mr. Beckel is  doing well after aortic valve replacement.  Gradients were mildly elevated on his postoperative echo.  Mean gradient was 13 mmHg on his most recent echo.  We will repeat his echocardiogram.   # Hypertension:  Blood pressure has been elevated both at home and in the office today.  At home he has only been slightly above goal.  We will increase the losartan to 100 mg and metoprolol to 25 mg.  If his blood pressure remains above goal would consider switching to an alternative ARB such as irbesartan or olmesartan at follow-up.   # PVCs: More frequent since reducing metoprolol.  There has not been any improvement in his dizziness since the dose was reduced.  Therefore we will increase it back to 25 mg.   # Ascending aorta aneurysm: 3.7 cm on CT 04/2019.  We will get an echo.   Medication Adjustments/Labs and Tests Ordered: Current medicines are reviewed at length with the patient today.  Concerns regarding medicines are outlined above.   Tests Ordered: Orders Placed This Encounter  Procedures   Basic metabolic panel   EKG 54-MGQQ   ECHOCARDIOGRAM COMPLETE     Medication Changes: Meds ordered this encounter  Medications   metoprolol succinate (TOPROL-XL) 25 MG 24 hr tablet    Sig: Take 1 tablet (25 mg total) by mouth daily.    Dispense:  90 tablet    Refill:  3   losartan (COZAAR) 100 MG tablet    Sig: Take 1 tablet (100 mg total) by mouth daily.    Dispense:  90 tablet    Refill:  1    NEW DOSE     Follow Up: in 6 months  Signed, [name] Latch, MD  07/26/2021 5:08 PM    [place]

## 2021-07-26 NOTE — Patient Instructions (Addendum)
Medication Instructions:  INCREASE METOPROLOL TO 25 MG DAILY   INCREASE YOUR LOSARTAN TO 100 MG DAILY   *If you need a refill on your cardiac medications before your next appointment, please call your pharmacy*  Lab Work: BMET IN 1 WEEK   If you have labs (blood work) drawn today and your tests are completely normal, you will receive your results only by: [place] (if you have MyChart) OR A paper copy in the mail If you have any lab test that is abnormal or we need to change your treatment, we will call you to review the results.  Testing/Procedures: Your physician has requested that you have an echocardiogram. Echocardiography is a painless test that uses sound waves to create images of your heart. It provides your doctor with information about the size and shape of your heart and how well your heart's chambers and valves are working. This procedure takes approximately one hour. There are no restrictions for this procedure.  Follow-Up: At [facility], you and your health needs are our priority.  As part of our continuing mission to provide you with exceptional heart care, we have created designated Provider Care Teams.  These Care Teams include your primary Cardiologist (physician) and Advanced Practice Providers (APPs -  Physician Assistants and Nurse Practitioners) who all work together to provide you with the care you need, when you need it.  We recommend signing up for the patient portal called "MyChart".  Sign up information is provided on this After Visit Summary.  MyChart is used to connect with patients for Virtual Visits (Telemedicine).  Patients are able to view lab/test results, encounter notes, upcoming appointments, etc.  Non-urgent messages can be sent to your provider as well.   To learn more about what you can do with MyChart, go to [email or web address].    Your next appointment:   6 month(s)  The format for your next appointment:   In Person  Provider:    Skeet Latch, MD  Your physician recommends that you schedule a follow-up appointment in: [place] [facility] IN 1-2 MONTHS

## 2021-08-01 ENCOUNTER — Other Ambulatory Visit: Payer: Self-pay | Admitting: Cardiovascular Disease

## 2021-08-03 DIAGNOSIS — Z5181 Encounter for therapeutic drug level monitoring: Secondary | ICD-10-CM | POA: Diagnosis not present

## 2021-08-03 DIAGNOSIS — I10 Essential (primary) hypertension: Secondary | ICD-10-CM | POA: Diagnosis not present

## 2021-08-03 DIAGNOSIS — H04123 Dry eye syndrome of bilateral lacrimal glands: Secondary | ICD-10-CM | POA: Diagnosis not present

## 2021-08-03 LAB — BASIC METABOLIC PANEL
BUN/Creatinine Ratio: 12 (ref 10–24)
BUN: 13 mg/dL (ref 8–27)
CO2: 25 mmol/L (ref 20–29)
Calcium: 9.5 mg/dL (ref 8.6–10.2)
Chloride: 103 mmol/L (ref 96–106)
Creatinine, Ser: 1.07 mg/dL (ref 0.76–1.27)
Glucose: 92 mg/dL (ref 70–99)
Potassium: 4.5 mmol/L (ref 3.5–5.2)
Sodium: 142 mmol/L (ref 134–144)
eGFR: 75 mL/min/{1.73_m2} (ref >59)

## 2021-08-10 ENCOUNTER — Ambulatory Visit (INDEPENDENT_AMBULATORY_CARE_PROVIDER_SITE_OTHER): Payer: Medicare Other

## 2021-08-10 ENCOUNTER — Other Ambulatory Visit: Payer: Self-pay

## 2021-08-10 DIAGNOSIS — Z952 Presence of prosthetic heart valve: Secondary | ICD-10-CM

## 2021-08-10 LAB — ECHOCARDIOGRAM COMPLETE
AR max vel: 1.33 cm2
AV Area VTI: 1.33 cm2
AV Area mean vel: 1.27 cm2
AV Mean grad: 15.5 mmHg
AV Peak grad: 27.6 mmHg
Ao pk vel: 2.63 m/s
Area-P 1/2: 2.94 cm2
S' Lateral: 3.71 cm

## 2021-08-11 DIAGNOSIS — D2272 Melanocytic nevi of left lower limb, including hip: Secondary | ICD-10-CM | POA: Diagnosis not present

## 2021-08-11 DIAGNOSIS — Z85820 Personal history of malignant melanoma of skin: Secondary | ICD-10-CM | POA: Diagnosis not present

## 2021-08-11 DIAGNOSIS — D2261 Melanocytic nevi of right upper limb, including shoulder: Secondary | ICD-10-CM | POA: Diagnosis not present

## 2021-08-11 DIAGNOSIS — D2262 Melanocytic nevi of left upper limb, including shoulder: Secondary | ICD-10-CM | POA: Diagnosis not present

## 2021-08-11 DIAGNOSIS — D2271 Melanocytic nevi of right lower limb, including hip: Secondary | ICD-10-CM | POA: Diagnosis not present

## 2021-08-11 DIAGNOSIS — L812 Freckles: Secondary | ICD-10-CM | POA: Diagnosis not present

## 2021-08-11 DIAGNOSIS — D225 Melanocytic nevi of trunk: Secondary | ICD-10-CM | POA: Diagnosis not present

## 2021-08-11 DIAGNOSIS — L858 Other specified epidermal thickening: Secondary | ICD-10-CM | POA: Diagnosis not present

## 2021-08-12 ENCOUNTER — Encounter (INDEPENDENT_AMBULATORY_CARE_PROVIDER_SITE_OTHER): Payer: Medicare Other | Admitting: Ophthalmology

## 2021-08-12 ENCOUNTER — Other Ambulatory Visit: Payer: Self-pay

## 2021-08-12 DIAGNOSIS — H43813 Vitreous degeneration, bilateral: Secondary | ICD-10-CM

## 2021-08-12 DIAGNOSIS — H59033 Cystoid macular edema following cataract surgery, bilateral: Secondary | ICD-10-CM | POA: Diagnosis not present

## 2021-08-12 DIAGNOSIS — H33302 Unspecified retinal break, left eye: Secondary | ICD-10-CM | POA: Diagnosis not present

## 2021-09-01 ENCOUNTER — Encounter: Payer: Self-pay | Admitting: Registered Nurse

## 2021-09-01 ENCOUNTER — Ambulatory Visit (INDEPENDENT_AMBULATORY_CARE_PROVIDER_SITE_OTHER): Payer: Medicare Other | Admitting: Registered Nurse

## 2021-09-01 ENCOUNTER — Other Ambulatory Visit: Payer: Self-pay

## 2021-09-01 VITALS — BP 142/66 | HR 80 | Temp 98.1°F | Resp 18 | Ht 65.0 in | Wt 178.6 lb

## 2021-09-01 DIAGNOSIS — Z189 Retained foreign body fragments, unspecified material: Secondary | ICD-10-CM

## 2021-09-01 MED ORDER — SULFAMETHOXAZOLE-TRIMETHOPRIM 800-160 MG PO TABS: 1.0000 | ORAL_TABLET | Freq: Two times a day (BID) | ORAL | 0 refills | Status: DC

## 2021-09-01 MED ORDER — MUPIROCIN 2 % EX OINT: 1.0000 "application " | TOPICAL_OINTMENT | Freq: Two times a day (BID) | CUTANEOUS | 0 refills | Status: DC

## 2021-09-01 NOTE — Patient Instructions (Addendum)
° ° ° °  If you have lab work done today you will be contacted with your lab results within the next 2 weeks.  If you have not heard from us then please contact us. The fastest way to get your results is to register for My Chart. ° ° °IF you received an x-ray today, you will receive an invoice from [facility]. Please contact [facility] at [phone] with questions or concerns regarding your invoice.  ° °IF you received labwork today, you will receive an invoice from [facility]. Please contact [facility] at [phone] with questions or concerns regarding your invoice.  ° °Our billing staff will not be able to assist you with questions regarding bills from these companies. ° °You will be contacted with the lab results as soon as they are available. The fastest way to get your results is to activate your My Chart account. Instructions are located on the last page of this paperwork. If you have not heard from us regarding the results in 2 weeks, please contact this office. °  ° ° ° °

## 2021-09-01 NOTE — Progress Notes (Signed)
Established Patient Office Visit  Subjective:  Patient ID: [name] Brown, male    DOB: 24-Dec-1950  Age: 70 y.o. MRN: [id number]  CC:  Chief Complaint  Patient presents with   Hand Pain    Patient states on Monday he got a splinter in his right thumb. Patient thinks the thumb is getting infected.    HPI [name] Brown presents for foreign body  Wood splinter, R thumb.  Monday evening Bleeding, was able to remove some of splinter but fears retained portion. Some oozing or purulent drainage Has used topical triple antibiotic, covered with bandage briefly, otherwise no intervention   Some local redness and swelling. No streaking in local area or proximal to wound. No constitutional symptoms.  Of note - pt has artificial heart valve.  Past Medical History:  Diagnosis Date   Aortic stenosis    Ascending aortic aneurysm 09/18/2018   Colon cancer [facility])    Family history of adverse reaction to anesthesia    pt states his mother had 2 episodes of hyponatremia following anesthesia    GERD (gastroesophageal reflux disease)    Headache    Heart murmur    Hyperlipidemia 10/25/2016   Hypertension    pt is currently not taking any medications; pt states is borderline   Melanoma ([place])    OSA on CPAP 09/18/2018   PAF (paroxysmal atrial fibrillation) ([place]) 06/13/2017   Perforated sigmoid colon ([facility])    PVC's (premature ventricular contractions) 10/25/2016   Tinnitus    Wears glasses     Past Surgical History:  Procedure Laterality Date   AORTIC VALVE REPLACEMENT N/A 06/08/2017   Procedure: AORTIC VALVE REPLACEMENT (AVR);  Surgeon: [name], MD;  Location: [place];  Service: Open Heart Surgery;  Laterality: N/A;  Using 75m Perimount Magna Ease Pericardial Bioprosthesis Aortic Valve   COLOSTOMY CLOSURE N/A 08/18/2016   Procedure: COLOSTOMY CLOSURE OPEN PROCEDURE;  Surgeon: TArmandina Gemma MD;  [facility];  Service: General;  Laterality: N/A;   LAPAROTOMY N/A 04/25/2016   Procedure:  EXPLORATORY LAPAROTOMY WITH SIGMOID COLECTOMY, COLOSTOMY;  Surgeon: TArmandina Gemma MD;  [facility];  Service: General;  Laterality: N/A;   LEFT HEART CATH AND CORONARY ANGIOGRAPHY N/A 11/03/2016   Procedure: Left Heart Cath and Coronary Angiography;  Surgeon: CBurnell Blanks MD;  [place];  Service: Cardiovascular;  Laterality: N/A;   MELANOMA EXCISION     PARTIAL COLECTOMY Right 08/18/2016   Procedure: RIGHT COLECTOMY;  Surgeon: TArmandina Gemma MD;  [facility];  Service: General;  Laterality: Right;   TEE WITHOUT CARDIOVERSION N/A 06/08/2017   Procedure: TRANSESOPHAGEAL ECHOCARDIOGRAM (TEE);  Surgeon: BGaye Pollack MD;  [place]  Service: Open Heart Surgery;  Laterality: N/A;    Family History  Problem Relation Age of Onset   Dementia Mother    Kidney disease Mother    Vascular Disease Mother    CAD Mother    Heart attack Father    Alcohol abuse Father    Cirrhosis Father    Stroke Maternal Grandmother    Stroke Paternal Grandmother     Social History   Socioeconomic History   Marital status: Married    Spouse name: Not on file   Number of children: Not on file   Years of education: Not on file   Highest education level: Not on file  Occupational History   Occupation: Retired PSoftware engineer Tobacco Use   Smoking status: Never   Smokeless tobacco: Never  Vaping Use   Vaping Use: Never used  Substance and Sexual Activity   Alcohol use: Yes    Alcohol/week: 21.0 - 24.0 standard drinks    Types: 21 - 24 Standard drinks or equivalent per week    Comment: 2-3 glasses of wine daily    Drug use: No   Sexual activity: Not on file  Other Topics Concern   Not on file  Social History Narrative   Pt lives in [place] with his wife, they are active.      Pt lives at home with his wife.      1 Cat    Social Determinants of Health   Financial Resource Strain: Low Risk    Difficulty of Paying Living Expenses: Not hard at all  Food  Insecurity: No Food Insecurity   Worried About Charity fundraiser in the Last Year: Never true   Ran Out of Food in the Last Year: Never true  Transportation Needs: No Transportation Needs   Lack of Transportation (Medical): No   Lack of Transportation (Non-Medical): No  Physical Activity: Sufficiently Active   Days of Exercise per Week: 5 days   Minutes of Exercise per Session: 40 min  Stress: No Stress Concern Present   Feeling of Stress : Not at all  Social Connections: Moderately Isolated   Frequency of Communication with Friends and Family: More than three times a week   Frequency of Social Gatherings with Friends and Family: More than three times a week   Attends Religious Services: Never   Marine scientist or Organizations: No   Attends Music therapist: Never   Marital Status: Married  Human resources officer Violence: Not At Risk   Fear of Current or Ex-Partner: No   Emotionally Abused: No   Physically Abused: No   Sexually Abused: No    Outpatient Medications Prior to Visit  Medication Sig Dispense Refill   aspirin EC 81 MG tablet Take 81 mg by mouth daily.     ezetimibe (ZETIA) 10 MG tablet TAKE ONE TABLET BY MOUTH DAILY 90 tablet 2   famotidine (PEPCID) 20 MG tablet Take 20 mg by mouth daily as needed for heartburn or indigestion.     levothyroxine (SYNTHROID) 50 MCG tablet TAKE ONE TABLET BY MOUTH DAILY 90 tablet 1   losartan (COZAAR) 100 MG tablet Take 1 tablet (100 mg total) by mouth daily. 90 tablet 1   metoprolol succinate (TOPROL-XL) 25 MG 24 hr tablet Take 1 tablet (25 mg total) by mouth daily. 90 tablet 3   pravastatin (PRAVACHOL) 20 MG tablet TAKE 1 TABLET (20 MG TOTAL) BY MOUTH DAILY. 90 tablet 3   prednisoLONE acetate (PRED FORTE) 1 % ophthalmic suspension Place 1 drop into both eyes 4 (four) times daily.     rOPINIRole (REQUIP) 0.25 MG tablet Take 3 tablets (0.75 mg total) by mouth at bedtime. 270 tablet 3   tadalafil (CIALIS) 20 MG tablet Take  20 mg by mouth daily as needed for erectile dysfunction.     No facility-administered medications prior to visit.    No Known Allergies  ROS Review of Systems  Constitutional: Negative.   HENT: Negative.    Eyes: Negative.   Respiratory: Negative.    Cardiovascular: Negative.   Gastrointestinal: Negative.   Genitourinary: Negative.   Musculoskeletal: Negative.   Skin: Negative.   Neurological: Negative.   Psychiatric/Behavioral: Negative.    All other systems reviewed and are negative.    Objective:  Physical Exam Constitutional:      General: He is not in acute distress.    Appearance: Normal appearance. He is normal weight. He is not ill-appearing, toxic-appearing or diaphoretic.  Cardiovascular:     Rate and Rhythm: Normal rate and regular rhythm.     Heart sounds: Normal heart sounds. No murmur heard.   No friction rub. No gallop.  Pulmonary:     Effort: Pulmonary effort is normal. No respiratory distress.     Breath sounds: Normal breath sounds. No stridor. No wheezing, rhonchi or rales.  Chest:     Chest wall: No tenderness.  Skin:    General: Skin is warm and dry.     Coloration: Skin is not jaundiced or pale.     Findings: Lesion (puncture wound at ventral/lateral edge of R thumb) present. No bruising, erythema or rash.  Neurological:     General: No focal deficit present.     Mental Status: He is alert and oriented to person, place, and time. Mental status is at baseline.  Psychiatric:        Mood and Affect: Mood normal.        Behavior: Behavior normal.        Thought Content: Thought content normal.        Judgment: Judgment normal.    BP (!) 142/66    Pulse 80    Temp 98.1 F (36.7 C) (Temporal)    Resp 18    Ht '5\' 5"'  (1.651 m)    Wt 178 lb 9.6 oz (81 kg)    SpO2 99%    BMI 29.72 kg/m  Wt Readings from Last 3 Encounters:  09/01/21 178 lb 9.6 oz (81 kg)  07/26/21 174 lb 6.4 oz (79.1 kg)  01/12/21 172 lb 3.2 oz (78.1 kg)     PROCEDURE Discussed risks, benefits, alternatives to foreign body removal Pt given opportunity to ask questions and express concerns Consent signed. Area sterilized with alcohol prep pad. Rinsed with sterile 1% lidocaine. Using forceps, removed loose scab and dried purulent drainage. Unable to locate retained foreign body. Pt tolerated procedure well. Area covered with antibiotic ointment and band aid Plan to refer to gen surg for foreign body removal.   Health Maintenance Due  Topic Date Due   Zoster Vaccines- Shingrix (1 of 2) Never done   COVID-19 Vaccine (4 - Booster for Moderna series) 11/21/2020   INFLUENZA VACCINE  04/12/2021    There are no preventive care reminders to display for this patient.  Lab Results  Component Value Date   TSH 2.07 10/19/2020   Lab Results  Component Value Date   WBC 6.4 01/06/2021   HGB 14.4 01/06/2021   HCT 42.2 01/06/2021   MCV 98.1 01/06/2021   PLT 165 01/06/2021   Lab Results  Component Value Date   NA 142 08/03/2021   K 4.5 08/03/2021   CO2 25 08/03/2021   GLUCOSE 92 08/03/2021   BUN 13 08/03/2021   CREATININE 1.07 08/03/2021   BILITOT 0.7 01/04/2021   ALKPHOS 51 01/04/2021   AST 21 01/04/2021   ALT 26 01/04/2021   PROT 6.7 01/04/2021   ALBUMIN 4.3 01/04/2021   CALCIUM 9.5 08/03/2021   ANIONGAP 11 01/06/2021   EGFR 75 08/03/2021   GFR 63.98 10/19/2020   Lab Results  Component Value Date   CHOL 162 10/19/2020   Lab Results  Component Value Date   HDL 51.10 10/19/2020   Lab Results  Component Value Date  LDLCALC 77 10/19/2020   Lab Results  Component Value Date   TRIG 167.0 (H) 10/19/2020   Lab Results  Component Value Date   CHOLHDL 3 10/19/2020   Lab Results  Component Value Date   HGBA1C 5.1 06/06/2017      Assessment & Plan:   Problem List Items Addressed This Visit   None Visit Diagnoses     Retained foreign body    -  Primary   Relevant Medications   sulfamethoxazole-trimethoprim  (BACTRIM DS) 800-160 MG tablet   mupirocin ointment (BACTROBAN) 2 %   Other Relevant Orders   Ambulatory referral to General Surgery       Meds ordered this encounter  Medications   sulfamethoxazole-trimethoprim (BACTRIM DS) 800-160 MG tablet    Sig: Take 1 tablet by mouth 2 (two) times daily.    Dispense:  20 tablet    Refill:  0    Order Specific Question:   Supervising Provider    Answer:   Carlota Raspberry, JEFFREY R [[id number]]   mupirocin ointment (BACTROBAN) 2 %    Sig: Apply 1 application topically 2 (two) times daily.    Dispense:  22 g    Refill:  0    Order Specific Question:   Supervising Provider    Answer:   Carlota Raspberry, JEFFREY R [[id number]]    Follow-up: Return if symptoms worsen or fail to improve.   PLAN Refer to gen surg. Aiming to have foreign body removal within a few days as his artificial valve puts particular risk behind developing sepsis. Start bactrim po bid, topical mupirocin 2% bid. Given ER precautions for worsening infection Patient encouraged to call clinic with any questions, comments, or concerns.   Maximiano Coss, NP

## 2021-09-02 ENCOUNTER — Encounter: Payer: Self-pay | Admitting: Registered Nurse

## 2021-09-03 ENCOUNTER — Ambulatory Visit: Payer: Medicare Other | Admitting: Orthopedic Surgery

## 2021-09-03 NOTE — Telephone Encounter (Signed)
Patient states that his splinter has popped out and he is going to cancel the surgery appointment.

## 2021-09-23 ENCOUNTER — Ambulatory Visit: Payer: Medicare Other | Admitting: Adult Health

## 2021-09-23 ENCOUNTER — Encounter: Payer: Self-pay | Admitting: Adult Health

## 2021-10-06 ENCOUNTER — Encounter (HOSPITAL_BASED_OUTPATIENT_CLINIC_OR_DEPARTMENT_OTHER): Payer: Self-pay | Admitting: Cardiovascular Disease

## 2021-10-21 ENCOUNTER — Other Ambulatory Visit (HOSPITAL_BASED_OUTPATIENT_CLINIC_OR_DEPARTMENT_OTHER): Payer: Self-pay | Admitting: Cardiovascular Disease

## 2021-10-22 NOTE — Telephone Encounter (Signed)
Rx(s) sent to pharmacy electronically.  

## 2021-10-28 ENCOUNTER — Ambulatory Visit (INDEPENDENT_AMBULATORY_CARE_PROVIDER_SITE_OTHER): Payer: Medicare Other | Admitting: Family Medicine

## 2021-10-28 ENCOUNTER — Encounter: Payer: Self-pay | Admitting: Family Medicine

## 2021-10-28 VITALS — BP 128/75 | HR 72 | Ht 65.0 in | Wt 178.0 lb

## 2021-10-28 DIAGNOSIS — Z9989 Dependence on other enabling machines and devices: Secondary | ICD-10-CM

## 2021-10-28 DIAGNOSIS — G2581 Restless legs syndrome: Secondary | ICD-10-CM | POA: Diagnosis not present

## 2021-10-28 DIAGNOSIS — G4733 Obstructive sleep apnea (adult) (pediatric): Secondary | ICD-10-CM | POA: Diagnosis not present

## 2021-10-28 MED ORDER — ROPINIROLE HCL 0.25 MG PO TABS: 0.7500 mg | ORAL_TABLET | Freq: Every day | ORAL | 3 refills | Status: DC

## 2021-10-28 NOTE — Progress Notes (Signed)
PATIENT: [name] Brown DOB: Jun 25, 1951  REASON FOR VISIT: follow up HISTORY FROM: patient  Chief Complaint  Patient presents with   Obstructive Sleep Apnea    Rm 10, alone. Here for yearly CPAP and RLS f/u. His current mask compress and goes up to his eyes. Causing eye dryness and problems. Current mask F20. RLS- evenings have been more of an issue.      HISTORY OF PRESENT ILLNESS:  10/28/21 ALL:  [name] Brown is a 71 y.o. male here today for follow up for OSA on CPAP and RLS. He continues ropinirole  0.75mg  at bedtime. He feels this works well to manage symptoms at night. He does have some discomfort in the evenings before bedtime. He is tolerating meds well with no adverse effects. He continues consistent use of CPAP. He is using a F20 FFM. It does push against his lower eyes but has tried multiple other masks that were not a good fit for him. He is followed by ophthalmology. He is using Restasis and eye drops for dry eyes.     HISTORY: (copied from Dr Guadelupe Sabin previous note)  [name] as a 72 year old right-handed gentleman with an underlying medical history of aortic stenosis with heart murmur, hypertension, hyperlipidemia, history of melanoma, history of paroxysmal A. fib, reflux disease, history of perforated sigmoid colon, history of PVCs, tinnitus, and mildly overweight state, who presents for follow-up consultation of his RLS and OSA, on treatment with CPAP therapy. The patient is unaccompanied today and presents for his yearly check up.  I last saw him on 09/18/2019, at which time he was fully compliant with his CPAP.  He was able to tolerate Requip.  Requip was helpful for his restless leg symptoms.  He was doing well with regards to his sleep apnea treatment with CPAP.  He was advised to follow-up routinely in 1 year.  He was advised to continue with ropinirole 0.75 mg each night.   Today, 09/22/2020: I reviewed his CPAP compliance data from the past 30 days from 08/22/2020  through 09/20/2020, during which time he used his machine every night with percent use days greater than 4 hours at 100%, indicating superb compliance with an average usage of 8 hours and 3 minutes, residual AHI at goal at 2.1/h, leak on the low side with a 95th percentile at 1.2 L/min on a pressure of 9 cm without EPR.  He reports doing well with his CPAP.  He is using a small mask, the medium was too large for him.  He is very consistent with the usage and does not mind using CPAP long-term.  Ropinirole continues to help with his leg twitching and he takes it every night around 9 PM, he is in bed generally between 1030 and 11 PM.  He has noticed no side effects with the ropinirole, in particular, no significant swelling in the lower extremities, no sleepiness, no abnormal behaviors noted.  He sees his cardiologist twice a year and primary care also twice a year.  He had an increase in his pravastatin.   REVIEW OF SYSTEMS: Out of a complete 14 system review of symptoms, the patient complains only of the following symptoms, RLS and all other reviewed systems are negative.  ESS: 6  ALLERGIES: No Known Allergies  HOME MEDICATIONS: Outpatient Medications Prior to Visit  Medication Sig Dispense Refill   aspirin EC 81 MG tablet Take 81 mg by mouth daily.     ezetimibe (ZETIA) 10 MG tablet TAKE ONE TABLET  BY MOUTH DAILY 90 tablet 2   famotidine (PEPCID) 20 MG tablet Take 20 mg by mouth daily as needed for heartburn or indigestion.     levothyroxine (SYNTHROID) 50 MCG tablet TAKE ONE TABLET BY MOUTH DAILY 90 tablet 1   metoprolol succinate (TOPROL-XL) 25 MG 24 hr tablet Take 1 tablet (25 mg total) by mouth daily. 90 tablet 3   pravastatin (PRAVACHOL) 20 MG tablet TAKE 1 TABLET (20 MG TOTAL) BY MOUTH DAILY. 90 tablet 3   prednisoLONE acetate (PRED FORTE) 1 % ophthalmic suspension Place 1 drop into both eyes 4 (four) times daily.     tadalafil (CIALIS) 20 MG tablet Take 20 mg by mouth daily as needed for  erectile dysfunction.     rOPINIRole (REQUIP) 0.25 MG tablet Take 3 tablets (0.75 mg total) by mouth at bedtime. 270 tablet 3   losartan (COZAAR) 100 MG tablet Take 1 tablet (100 mg total) by mouth daily. 90 tablet 1   mupirocin ointment (BACTROBAN) 2 % Apply 1 application topically 2 (two) times daily. 22 g 0   sulfamethoxazole-trimethoprim (BACTRIM DS) 800-160 MG tablet Take 1 tablet by mouth 2 (two) times daily. 20 tablet 0   No facility-administered medications prior to visit.    PAST MEDICAL HISTORY: Past Medical History:  Diagnosis Date   Aortic stenosis    Ascending aortic aneurysm 09/18/2018   Colon cancer [facility])    Family history of adverse reaction to anesthesia    pt states his mother had 2 episodes of hyponatremia following anesthesia    GERD (gastroesophageal reflux disease)    Headache    Heart murmur    Hyperlipidemia 10/25/2016   Hypertension    pt is currently not taking any medications; pt states is borderline   Melanoma ([facility])    OSA on CPAP 09/18/2018   PAF (paroxysmal atrial fibrillation) ([place]) 06/13/2017   Perforated sigmoid colon ([facility])    PVC's (premature ventricular contractions) 10/25/2016   Tinnitus    Wears glasses     PAST SURGICAL HISTORY: Past Surgical History:  Procedure Laterality Date   AORTIC VALVE REPLACEMENT N/A 06/08/2017   Procedure: AORTIC VALVE REPLACEMENT (AVR);  Surgeon: [name], MD;  Location: [place];  Service: Open Heart Surgery;  Laterality: N/A;  Using 61mm Perimount Magna Ease Pericardial Bioprosthesis Aortic Valve   COLOSTOMY CLOSURE N/A 08/18/2016   Procedure: COLOSTOMY CLOSURE OPEN PROCEDURE;  Surgeon: Armandina Gemma, MD;  [facility];  Service: General;  Laterality: N/A;   LAPAROTOMY N/A 04/25/2016   Procedure: EXPLORATORY LAPAROTOMY WITH SIGMOID COLECTOMY, COLOSTOMY;  Surgeon: Armandina Gemma, MD;  [facility];  Service: General;  Laterality: N/A;   LEFT HEART CATH AND CORONARY ANGIOGRAPHY N/A 11/03/2016   Procedure: Left  Heart Cath and Coronary Angiography;  Surgeon: Burnell Blanks, MD;  Location: [place] [facility];  Service: Cardiovascular;  Laterality: N/A;   MELANOMA EXCISION     PARTIAL COLECTOMY Right 08/18/2016   Procedure: RIGHT COLECTOMY;  Surgeon: Armandina Gemma, MD;  [facility];  Service: General;  Laterality: Right;   TEE WITHOUT CARDIOVERSION N/A 06/08/2017   Procedure: TRANSESOPHAGEAL ECHOCARDIOGRAM (TEE);  Surgeon: [name], MD;  Location: [place];  Service: Open Heart Surgery;  Laterality: N/A;    FAMILY HISTORY: Family History  Problem Relation Age of Onset   Dementia Mother    Kidney disease Mother    Vascular Disease Mother    CAD Mother    Heart attack Father  Alcohol abuse Father    Cirrhosis Father    Stroke Maternal Grandmother    Stroke Paternal Grandmother     SOCIAL HISTORY: Social History   Socioeconomic History   Marital status: Married    Spouse name: Not on file   Number of children: Not on file   Years of education: Not on file   Highest education level: Not on file  Occupational History   Occupation: Retired Software engineer  Tobacco Use   Smoking status: Never   Smokeless tobacco: Never  Vaping Use   Vaping Use: Never used  Substance and Sexual Activity   Alcohol use: Yes    Alcohol/week: 21.0 - 24.0 standard drinks    Types: 21 - 24 Standard drinks or equivalent per week    Comment: 2-3 glasses of wine daily    Drug use: No   Sexual activity: Not on file  Other Topics Concern   Not on file  Social History Narrative   Pt lives in [place] with his wife, they are active.      Pt lives at home with his wife.      1 Cat    Social Determinants of Health   Financial Resource Strain: Not on file  Food Insecurity: Not on file  Transportation Needs: Not on file  Physical Activity: Not on file  Stress: Not on file  Social Connections: Not on file  Intimate Partner Violence: Not on file     PHYSICAL EXAM  Vitals:   10/28/21 1001   BP: 128/75  Pulse: 72   There is no height or weight on file to calculate BMI.  Generalized: Well developed, in no acute distress  Cardiology: normal rate and rhythm, no murmur noted Respiratory: clear to auscultation bilaterally  Neurological examination  Mentation: Alert oriented to time, place, history taking. Follows all commands speech and language fluent Cranial nerve II-XII: Pupils were equal round reactive to light. Extraocular movements were full, visual field were full  Motor: The motor testing reveals 5 over 5 strength of all 4 extremities. Good symmetric motor tone is noted throughout.  Gait and station: Gait is normal.    DIAGNOSTIC DATA (LABS, IMAGING, TESTING) - I reviewed patient records, labs, notes, testing and imaging myself where available.  MMSE - Mini Mental State Exam 03/28/2018  Orientation to time 5  Orientation to Place 5  Registration 3  Attention/ Calculation 5  Recall 3  Language- name 2 objects 2  Language- repeat 1  Language- follow 3 step command 3  Language- read & follow direction 1  Write a sentence 1  Copy design 1  Total score 30     Lab Results  Component Value Date   WBC 6.4 01/06/2021   HGB 14.4 01/06/2021   HCT 42.2 01/06/2021   MCV 98.1 01/06/2021   PLT 165 01/06/2021      Component Value Date/Time   NA 142 08/03/2021 0953   K 4.5 08/03/2021 0953   CL 103 08/03/2021 0953   CO2 25 08/03/2021 0953   GLUCOSE 92 08/03/2021 0953   GLUCOSE 73 01/06/2021 0110   BUN 13 08/03/2021 0953   CREATININE 1.07 08/03/2021 0953   CREATININE 1.04 10/25/2016 1547   CALCIUM 9.5 08/03/2021 0953   PROT 6.7 01/04/2021 0526   PROT 6.5 09/01/2020 0925   ALBUMIN 4.3 01/04/2021 0526   ALBUMIN 4.6 09/01/2020 0925   AST 21 01/04/2021 0526   ALT 26 01/04/2021 0526   ALKPHOS 51 01/04/2021 0526  BILITOT 0.7 01/04/2021 0526   BILITOT 0.6 09/01/2020 0925   GFRNONAA >60 01/06/2021 0110   GFRAA 77 09/01/2020 0925   Lab Results  Component Value  Date   CHOL 162 10/19/2020   HDL 51.10 10/19/2020   LDLCALC 77 10/19/2020   LDLDIRECT 92.0 10/09/2019   TRIG 167.0 (H) 10/19/2020   CHOLHDL 3 10/19/2020   Lab Results  Component Value Date   HGBA1C 5.1 06/06/2017   Lab Results  Component Value Date   VITAMINB12 347 05/30/2018   Lab Results  Component Value Date   TSH 2.07 10/19/2020     ASSESSMENT AND PLAN 71 y.o. year old male  has a past medical history of Aortic stenosis, Ascending aortic aneurysm (09/18/2018), Colon cancer ([place]), Family history of adverse reaction to anesthesia, GERD (gastroesophageal reflux disease), Headache, Heart murmur, Hyperlipidemia (10/25/2016), Hypertension, Melanoma ([place]), OSA on CPAP (09/18/2018), PAF (paroxysmal atrial fibrillation) ([place]) (06/13/2017), Perforated sigmoid colon ([place]), PVC's (premature ventricular contractions) (10/25/2016), Tinnitus, and Wears glasses. here with     ICD-10-CM   1. OSA on CPAP  G47.33 For home use only DME continuous positive airway pressure (CPAP)   Z99.89     2. RLS (restless legs syndrome)  G25.81         [name] is doing well on CPAP therapy. Compliance report reveals excellent compliance. He was encouraged to continue using CPAP nightly and for greater than 4 hours each night. We will update supply orders as indicated. Risks of untreated sleep apnea review and education materials provided. He will continue ropinirole 0.75mg  at bedtime. May take 0.25mg  at dinner and 0.5mg  at bedtime if needed. Healthy lifestyle habits encouraged. He will follow up in 1 year. He verbalizes understanding and agreement with this plan.    Orders Placed This Encounter  Procedures   For home use only DME continuous positive airway pressure (CPAP)    Supplies    Order Specific Question:   Length of Need    Answer:   Lifetime    Order Specific Question:   Patient has OSA or probable OSA    Answer:   Yes    Order Specific Question:   Is the patient currently using CPAP in the home     Answer:   Yes    Order Specific Question:   Settings    Answer:   Other see comments    Order Specific Question:   CPAP supplies needed    Answer:   Mask, headgear, cushions, filters, heated tubing and water chamber     Meds ordered this encounter  Medications   rOPINIRole (REQUIP) 0.25 MG tablet    Sig: Take 3 tablets (0.75 mg total) by mouth at bedtime.    Dispense:  270 tablet    Refill:  3    Order Specific Question:   Supervising Provider    Answer:   Bess Harvest, FNP-C 10/28/2021, 10:27 AM [facility] [address] [place], [place] [address] [phone]

## 2021-10-28 NOTE — Patient Instructions (Addendum)
Below is our plan:  We will continue ropinirole 0.75mg  at bedtime. You can separate dosing pending needs for a total of 0.75mg  daily.   Please continue using your CPAP regularly. While your insurance requires that you use CPAP at least 4 hours each night on 70% of the nights, I recommend, that you not skip any nights and use it throughout the night if you can. Getting used to CPAP and staying with the treatment long term does take time and patience and discipline. Untreated obstructive sleep apnea when it is moderate to severe can have an adverse impact on cardiovascular health and raise her risk for heart disease, arrhythmias, hypertension, congestive heart failure, stroke and diabetes. Untreated obstructive sleep apnea causes sleep disruption, nonrestorative sleep, and sleep deprivation. This can have an impact on your day to day functioning and cause daytime sleepiness and impairment of cognitive function, memory loss, mood disturbance, and problems focussing. Using CPAP regularly can improve these symptoms.  Please make sure you are staying well hydrated. I recommend 50-60 ounces daily. Well balanced diet and regular exercise encouraged. Consistent sleep schedule with 6-8 hours recommended.   Please continue follow up with care team as directed.   Follow up with me in 1 year   You may receive a survey regarding today's visit. I encourage you to leave honest feed back as I do use this information to improve patient care. Thank you for seeing me today!

## 2021-10-28 NOTE — Progress Notes (Signed)
CM sent to [facility] for new order ?

## 2021-11-01 ENCOUNTER — Other Ambulatory Visit: Payer: Self-pay

## 2021-11-01 ENCOUNTER — Encounter: Payer: Self-pay | Admitting: Family Medicine

## 2021-11-01 DIAGNOSIS — E039 Hypothyroidism, unspecified: Secondary | ICD-10-CM

## 2021-11-01 MED ORDER — LEVOTHYROXINE SODIUM 50 MCG PO TABS: 50.0000 ug | ORAL_TABLET | Freq: Every day | ORAL | 0 refills | Status: DC

## 2021-11-04 ENCOUNTER — Encounter: Payer: Self-pay | Admitting: Family Medicine

## 2021-11-04 ENCOUNTER — Ambulatory Visit (INDEPENDENT_AMBULATORY_CARE_PROVIDER_SITE_OTHER): Payer: Medicare Other | Admitting: Family Medicine

## 2021-11-04 VITALS — BP 130/86 | HR 77 | Temp 98.2°F | Resp 16 | Wt 179.6 lb

## 2021-11-04 DIAGNOSIS — Z860100 Personal history of colon polyps, unspecified: Secondary | ICD-10-CM | POA: Insufficient documentation

## 2021-11-04 DIAGNOSIS — Z1210 Encounter for screening for malignant neoplasm of intestinal tract, unspecified: Secondary | ICD-10-CM | POA: Insufficient documentation

## 2021-11-04 DIAGNOSIS — Z125 Encounter for screening for malignant neoplasm of prostate: Secondary | ICD-10-CM

## 2021-11-04 DIAGNOSIS — E039 Hypothyroidism, unspecified: Secondary | ICD-10-CM | POA: Diagnosis not present

## 2021-11-04 DIAGNOSIS — I10 Essential (primary) hypertension: Secondary | ICD-10-CM

## 2021-11-04 DIAGNOSIS — K5901 Slow transit constipation: Secondary | ICD-10-CM | POA: Insufficient documentation

## 2021-11-04 DIAGNOSIS — K529 Noninfective gastroenteritis and colitis, unspecified: Secondary | ICD-10-CM | POA: Insufficient documentation

## 2021-11-04 DIAGNOSIS — E7849 Other hyperlipidemia: Secondary | ICD-10-CM | POA: Diagnosis not present

## 2021-11-04 DIAGNOSIS — Z9884 Bariatric surgery status: Secondary | ICD-10-CM | POA: Insufficient documentation

## 2021-11-04 DIAGNOSIS — R933 Abnormal findings on diagnostic imaging of other parts of digestive tract: Secondary | ICD-10-CM | POA: Insufficient documentation

## 2021-11-04 LAB — HEPATIC FUNCTION PANEL
ALT: 36 U/L (ref 0–53)
AST: 30 U/L (ref 0–37)
Albumin: 4.3 g/dL (ref 3.5–5.2)
Alkaline Phosphatase: 74 U/L (ref 39–117)
Bilirubin, Direct: 0.1 mg/dL (ref 0.0–0.3)
Total Bilirubin: 0.7 mg/dL (ref 0.2–1.2)
Total Protein: 6.6 g/dL (ref 6.0–8.3)

## 2021-11-04 LAB — BASIC METABOLIC PANEL
BUN: 18 mg/dL (ref 6–23)
CO2: 29 mEq/L (ref 19–32)
Calcium: 9.9 mg/dL (ref 8.4–10.5)
Chloride: 101 mEq/L (ref 96–112)
Creatinine, Ser: 1.11 mg/dL (ref 0.40–1.50)
GFR: 66.96 mL/min (ref >60.00)
Glucose, Bld: 97 mg/dL (ref 70–99)
Potassium: 4.8 mEq/L (ref 3.5–5.1)
Sodium: 137 mEq/L (ref 135–145)

## 2021-11-04 LAB — CBC WITH DIFFERENTIAL/PLATELET
Basophils Absolute: 0 10*3/uL (ref 0.0–0.1)
Basophils Relative: 0.5 % (ref 0.0–3.0)
Eosinophils Absolute: 0.1 10*3/uL (ref 0.0–0.7)
Eosinophils Relative: 1.5 % (ref 0.0–5.0)
HCT: 46 % (ref 39.0–52.0)
Hemoglobin: 15.6 g/dL (ref 13.0–17.0)
Lymphocytes Relative: 11.6 % — ABNORMAL LOW (ref 12.0–46.0)
Lymphs Abs: 0.7 10*3/uL (ref 0.7–4.0)
MCHC: 33.8 g/dL (ref 30.0–36.0)
MCV: 96.2 fl (ref 78.0–100.0)
Monocytes Absolute: 0.6 10*3/uL (ref 0.1–1.0)
Monocytes Relative: 9.1 % (ref 3.0–12.0)
Neutro Abs: 4.9 10*3/uL (ref 1.4–7.7)
Neutrophils Relative %: 77.3 % — ABNORMAL HIGH (ref 43.0–77.0)
Platelets: 176 10*3/uL (ref 150.0–400.0)
RBC: 4.79 Mil/uL (ref 4.22–5.81)
RDW: 13.3 % (ref 11.5–15.5)
WBC: 6.3 10*3/uL (ref 4.0–10.5)

## 2021-11-04 LAB — LIPID PANEL
Cholesterol: 164 mg/dL (ref 0–200)
HDL: 51.7 mg/dL (ref >39.00)
LDL Cholesterol: 74 mg/dL (ref 0–99)
NonHDL: 111.91
Total CHOL/HDL Ratio: 3
Triglycerides: 191 mg/dL — ABNORMAL HIGH (ref 0.0–149.0)
VLDL: 38.2 mg/dL (ref 0.0–40.0)

## 2021-11-04 LAB — PSA, MEDICARE: PSA: 0.38 ng/ml (ref 0.10–4.00)

## 2021-11-04 LAB — TSH: TSH: 2.67 u[IU]/mL (ref 0.35–5.50)

## 2021-11-04 NOTE — Progress Notes (Signed)
° °  Subjective:    Patient ID: [name] Brown, male    DOB: 1951-07-06, 71 y.o.   MRN: [id number]  HPI HTN- chronic problem, on Metoprolol XL 25mg  daily, Losartan 100mg  daily w/ adequate control today.  Pt reports decreased stamina when exercising and increased fatigue.  No CP, SOB, HAs, edema.  Hyperlipidemia- chronic problem, on Zetia 10mg  and Pravastatin 20mg  daily.  No abd pain, N/V.  Hypothyroid- chronic problem, on Levothyroxine 44mcg daily.  + fatigue.  No changes to skin/hair/nails.   Review of Systems For ROS see HPI   This visit occurred during the SARS-CoV-2 public health emergency.  Safety protocols were in place, including screening questions prior to the visit, additional usage of staff PPE, and extensive cleaning of exam room while observing appropriate contact time as indicated for disinfecting solutions.      Objective:   Physical Exam Vitals reviewed.  Constitutional:      General: He is not in acute distress.    Appearance: Normal appearance. He is well-developed. He is not ill-appearing.  HENT:     Head: Normocephalic and atraumatic.  Eyes:     Extraocular Movements: Extraocular movements intact.     Conjunctiva/sclera: Conjunctivae normal.     Pupils: Pupils are equal, round, and reactive to light.  Neck:     Thyroid: No thyromegaly.  Cardiovascular:     Rate and Rhythm: Normal rate and regular rhythm.     Pulses: Normal pulses.     Heart sounds: Murmur (II/VI SEM over RUSB w/ valvular click) heard.  Pulmonary:     Effort: Pulmonary effort is normal. No respiratory distress.     Breath sounds: Normal breath sounds.  Abdominal:     General: Bowel sounds are normal. There is no distension.     Palpations: Abdomen is soft.  Musculoskeletal:     Cervical back: Normal range of motion and neck supple.     Right lower leg: No edema.     Left lower leg: No edema.  Lymphadenopathy:     Cervical: No cervical adenopathy.  Skin:    General: Skin is warm and dry.   Neurological:     General: No focal deficit present.     Mental Status: He is alert and oriented to person, place, and time.     Cranial Nerves: No cranial nerve deficit.  Psychiatric:        Mood and Affect: Mood normal.        Behavior: Behavior normal.          Assessment & Plan:

## 2021-11-04 NOTE — Patient Instructions (Signed)
Follow up in 6 months to recheck BP and cholesterol We'll notify you of your lab results and make any changes if needed Continue to work on healthy diet and regular exercise- you're doing great! Call with any questions or concerns- particularly if fatigue or shortness of breath worsen [place]!!!

## 2021-11-04 NOTE — Assessment & Plan Note (Signed)
Chronic problem.  Pt reports increased fatigue recently.  Check labs.  Adjust meds prn

## 2021-11-04 NOTE — Assessment & Plan Note (Signed)
Chronic problem.  On Zetia and Pravastatin daily w/o difficulty.  Check labs.  Adjust meds prn

## 2021-11-04 NOTE — Assessment & Plan Note (Signed)
Chronic problem.  Adequate control on Metoprolol XL 25mg  and Losartan 100mg  daily.  Pt notes some increased fatigue and SOB w/ climbing stairs.  Discussed possible impact of beta blockers but he has been on this for quite awhile.  Check labs due to ARB.  No anticipated med changes.

## 2021-11-05 DIAGNOSIS — H04123 Dry eye syndrome of bilateral lacrimal glands: Secondary | ICD-10-CM | POA: Diagnosis not present

## 2021-11-08 ENCOUNTER — Other Ambulatory Visit: Payer: Self-pay

## 2021-11-08 DIAGNOSIS — E039 Hypothyroidism, unspecified: Secondary | ICD-10-CM

## 2021-11-08 MED ORDER — LEVOTHYROXINE SODIUM 50 MCG PO TABS: 50.0000 ug | ORAL_TABLET | Freq: Every day | ORAL | 0 refills | Status: DC

## 2021-11-11 ENCOUNTER — Other Ambulatory Visit: Payer: Self-pay

## 2021-11-11 ENCOUNTER — Encounter (INDEPENDENT_AMBULATORY_CARE_PROVIDER_SITE_OTHER): Payer: Medicare Other | Admitting: Ophthalmology

## 2021-11-11 DIAGNOSIS — I10 Essential (primary) hypertension: Secondary | ICD-10-CM | POA: Diagnosis not present

## 2021-11-11 DIAGNOSIS — H59033 Cystoid macular edema following cataract surgery, bilateral: Secondary | ICD-10-CM | POA: Diagnosis not present

## 2021-11-11 DIAGNOSIS — H33302 Unspecified retinal break, left eye: Secondary | ICD-10-CM

## 2021-11-11 DIAGNOSIS — H35033 Hypertensive retinopathy, bilateral: Secondary | ICD-10-CM | POA: Diagnosis not present

## 2021-11-11 DIAGNOSIS — H43813 Vitreous degeneration, bilateral: Secondary | ICD-10-CM

## 2021-11-22 DIAGNOSIS — Z20822 Contact with and (suspected) exposure to covid-19: Secondary | ICD-10-CM | POA: Diagnosis not present

## 2021-11-29 DIAGNOSIS — Z85820 Personal history of malignant melanoma of skin: Secondary | ICD-10-CM | POA: Diagnosis not present

## 2021-11-29 DIAGNOSIS — L738 Other specified follicular disorders: Secondary | ICD-10-CM | POA: Diagnosis not present

## 2021-12-22 DIAGNOSIS — Z20822 Contact with and (suspected) exposure to covid-19: Secondary | ICD-10-CM | POA: Diagnosis not present

## 2021-12-27 DIAGNOSIS — Z20822 Contact with and (suspected) exposure to covid-19: Secondary | ICD-10-CM | POA: Diagnosis not present

## 2022-01-06 ENCOUNTER — Ambulatory Visit (INDEPENDENT_AMBULATORY_CARE_PROVIDER_SITE_OTHER): Payer: Medicare Other

## 2022-01-06 DIAGNOSIS — Z0000 Encounter for general adult medical examination without abnormal findings: Secondary | ICD-10-CM

## 2022-01-06 NOTE — Patient Instructions (Signed)
[name] Brown , ?Thank you for taking time to come for your Medicare Wellness Visit. I appreciate your ongoing commitment to your health goals. Please review the following plan we discussed and let me know if I can assist you in the future.  ? ?Screening recommendations/referrals: ?Colonoscopy: 06/28/2021 ?Recommended yearly ophthalmology/optometry visit for glaucoma screening and checkup ?Recommended yearly dental visit for hygiene and checkup ? ?Vaccinations: ?Influenza vaccine: completed  ?Pneumococcal vaccine: completed  ?Tdap vaccine: 063/26/2020 ?Shingles vaccine: will consider    ? ?Advanced directives: none  ? ?Conditions/risks identified: none  ? ?Next appointment: none  ? ?Preventive Care 3 Years and Older, Male ?Preventive care refers to lifestyle choices and visits with your health care provider that can promote health and wellness. ?What does preventive care include? ?A yearly physical exam. This is also called an annual well check. ?Dental exams once or twice a year. ?Routine eye exams. Ask your health care provider how often you should have your eyes checked. ?Personal lifestyle choices, including: ?Daily care of your teeth and gums. ?Regular physical activity. ?Eating a healthy diet. ?Avoiding tobacco and drug use. ?Limiting alcohol use. ?Practicing safe sex. ?Taking low doses of aspirin every day. ?Taking vitamin and mineral supplements as recommended by your health care provider. ?What happens during an annual well check? ?The services and screenings done by your health care provider during your annual well check will depend on your age, overall health, lifestyle risk factors, and family history of disease. ?Counseling  ?Your health care provider may ask you questions about your: ?Alcohol use. ?Tobacco use. ?Drug use. ?Emotional well-being. ?Home and relationship well-being. ?Sexual activity. ?Eating habits. ?History of falls. ?Memory and ability to understand (cognition). ?Work and work  Statistician. ?Screening  ?You may have the following tests or measurements: ?Height, weight, and BMI. ?Blood pressure. ?Lipid and cholesterol levels. These may be checked every 5 years, or more frequently if you are over 62 years old. ?Skin check. ?Lung cancer screening. You may have this screening every year starting at age 26 if you have a 30-pack-year history of smoking and currently smoke or have quit within the past 15 years. ?Fecal occult blood test (FOBT) of the stool. You may have this test every year starting at age 32. ?Flexible sigmoidoscopy or colonoscopy. You may have a sigmoidoscopy every 5 years or a colonoscopy every 10 years starting at age 21. ?Prostate cancer screening. Recommendations will vary depending on your family history and other risks. ?Hepatitis C blood test. ?Hepatitis B blood test. ?Sexually transmitted disease (STD) testing. ?Diabetes screening. This is done by checking your blood sugar (glucose) after you have not eaten for a while (fasting). You may have this done every 1-3 years. ?Abdominal aortic aneurysm (AAA) screening. You may need this if you are a current or former smoker. ?Osteoporosis. You may be screened starting at age 64 if you are at high risk. ?Talk with your health care provider about your test results, treatment options, and if necessary, the need for more tests. ?Vaccines  ?Your health care provider may recommend certain vaccines, such as: ?Influenza vaccine. This is recommended every year. ?Tetanus, diphtheria, and acellular pertussis (Tdap, Td) vaccine. You may need a Td booster every 10 years. ?Zoster vaccine. You may need this after age 53. ?Pneumococcal 13-valent conjugate (PCV13) vaccine. One dose is recommended after age 63. ?Pneumococcal polysaccharide (PPSV23) vaccine. One dose is recommended after age 29. ?Talk to your health care provider about which screenings and vaccines you need and how often you  need them. ?This information is not intended to replace  advice given to you by your health care provider. Make sure you discuss any questions you have with your health care provider. ?Document Released: 09/25/2015 Document Revised: 05/18/2016 Document Reviewed: 06/30/2015 ?Elsevier Interactive Patient Education ? 2017 [employer]. ? ?Fall Prevention in the Home ?Falls can cause injuries. They can happen to people of all ages. There are many things you can do to make your home safe and to help prevent falls. ?What can I do on the outside of my home? ?Regularly fix the edges of walkways and driveways and fix any cracks. ?Remove anything that might make you trip as you walk through a door, such as a raised step or threshold. ?Trim any bushes or trees on the path to your home. ?Use bright outdoor lighting. ?Clear any walking paths of anything that might make someone trip, such as rocks or tools. ?Regularly check to see if handrails are loose or broken. Make sure that both sides of any steps have handrails. ?Any raised decks and porches should have guardrails on the edges. ?Have any leaves, snow, or ice cleared regularly. ?Use sand or salt on walking paths during winter. ?Clean up any spills in your garage right away. This includes oil or grease spills. ?What can I do in the bathroom? ?Use night lights. ?Install grab bars by the toilet and in the tub and shower. Do not use towel bars as grab bars. ?Use non-skid mats or decals in the tub or shower. ?If you need to sit down in the shower, use a plastic, non-slip stool. ?Keep the floor dry. Clean up any water that spills on the floor as soon as it happens. ?Remove soap buildup in the tub or shower regularly. ?Attach bath mats securely with double-sided non-slip rug tape. ?Do not have throw rugs and other things on the floor that can make you trip. ?What can I do in the bedroom? ?Use night lights. ?Make sure that you have a light by your bed that is easy to reach. ?Do not use any sheets or blankets that are too big for your bed.  They should not hang down onto the floor. ?Have a firm chair that has side arms. You can use this for support while you get dressed. ?Do not have throw rugs and other things on the floor that can make you trip. ?What can I do in the kitchen? ?Clean up any spills right away. ?Avoid walking on wet floors. ?Keep items that you use a lot in easy-to-reach places. ?If you need to reach something above you, use a strong step stool that has a grab bar. ?Keep electrical cords out of the way. ?Do not use floor polish or wax that makes floors slippery. If you must use wax, use non-skid floor wax. ?Do not have throw rugs and other things on the floor that can make you trip. ?What can I do with my stairs? ?Do not leave any items on the stairs. ?Make sure that there are handrails on both sides of the stairs and use them. Fix handrails that are broken or loose. Make sure that handrails are as long as the stairways. ?Check any carpeting to make sure that it is firmly attached to the stairs. Fix any carpet that is loose or worn. ?Avoid having throw rugs at the top or bottom of the stairs. If you do have throw rugs, attach them to the floor with carpet tape. ?Make sure that you have a light switch  at the top of the stairs and the bottom of the stairs. If you do not have them, ask someone to add them for you. ?What else can I do to help prevent falls? ?Wear shoes that: ?Do not have high heels. ?Have rubber bottoms. ?Are comfortable and fit you well. ?Are closed at the toe. Do not wear sandals. ?If you use a stepladder: ?Make sure that it is fully opened. Do not climb a closed stepladder. ?Make sure that both sides of the stepladder are locked into place. ?Ask someone to hold it for you, if possible. ?Clearly mark and make sure that you can see: ?Any grab bars or handrails. ?First and last steps. ?Where the edge of each step is. ?Use tools that help you move around (mobility aids) if they are needed. These  include: ?Canes. ?Walkers. ?Scooters. ?Crutches. ?Turn on the lights when you go into a dark area. Replace any light bulbs as soon as they burn out. ?Set up your furniture so you have a clear path. Avoid moving your furniture around. ?If a

## 2022-01-06 NOTE — Progress Notes (Signed)
? ?Subjective:  ? [name] Brown is a 71 y.o. male who presents for an Subsequent  Medicare Annual Wellness Visit. ? ? ?I connected with Billie Lade  today by telephone and verified that I am speaking with the correct person using two identifiers. ?Location patient: home ?Location provider: work ?Persons participating in the virtual visit: patient, provider. ?  ?I discussed the limitations, risks, security and privacy concerns of performing an evaluation and management service by telephone and the availability of in person appointments. I also discussed with the patient that there may be a patient responsible charge related to this service. The patient expressed understanding and verbally consented to this telephonic visit.  ?  ?Interactive audio and video telecommunications were attempted between this provider and patient, however failed, due to patient having technical difficulties OR patient did not have access to video capability.  We continued and completed visit with audio only. ? ?  ?Review of Systems    ? ?Cardiac Risk Factors include: advanced age (>73mn, >>67women);male gender ? ?   ?Objective:  ?  ?Today's Vitals  ? ?There is no height or weight on file to calculate BMI. ? ? ?  01/06/2022  ?  9:37 AM 01/04/2021  ?  2:04 PM 01/04/2021  ?  5:25 AM 10/12/2020  ? 12:35 PM 10/09/2019  ? 10:20 AM 12/23/2018  ?  8:16 PM 03/28/2018  ?  8:17 AM  ?Advanced Directives  ?Does Patient Have a Medical Advance Directive? Yes Yes Yes Yes Yes No Yes  ?Type of [place]aramed[place]of [place] will [place] will  [place] will Living will;Healthcare Power of [place] will  ?Does patient want to make changes to medical advance directive?  No - Patient declined   No - Patient declined    ?Copy of [place] Chart? No - copy requested No - copy requested  Yes - validated most recent copy scanned in  chart (See row information) Yes - validated most recent copy scanned in chart (See row information)  Yes  ?Would patient like information on creating a medical advance directive?      No - Patient declined   ? ? ?Current Medications (verified) ?Outpatient Encounter Medications as of 01/06/2022  ?Medication Sig  ? aspirin EC 81 MG tablet Take 81 mg by mouth daily.  ? ezetimibe (ZETIA) 10 MG tablet TAKE ONE TABLET BY MOUTH DAILY  ? famotidine (PEPCID) 20 MG tablet Take 20 mg by mouth daily as needed for heartburn or indigestion.  ? levothyroxine (SYNTHROID) 50 MCG tablet Take 1 tablet (50 mcg total) by mouth daily.  ? metoprolol succinate (TOPROL-XL) 25 MG 24 hr tablet Take 1 tablet (25 mg total) by mouth daily.  ? pravastatin (PRAVACHOL) 20 MG tablet TAKE 1 TABLET (20 MG TOTAL) BY MOUTH DAILY.  ? prednisoLONE acetate (PRED FORTE) 1 % ophthalmic suspension Place 1 drop into both eyes 4 (four) times daily.  ? rOPINIRole (REQUIP) 0.25 MG tablet Take 3 tablets (0.75 mg total) by mouth at bedtime.  ? tadalafil (CIALIS) 20 MG tablet Take 20 mg by mouth daily as needed for erectile dysfunction.  ? Varenicline Tartrate (TYRVAYA) 0.03 MG/ACT SOLN Place into the nose.  ? cycloSPORINE (RESTASIS) 0.05 % ophthalmic emulsion 1 drop 2 (two) times daily.  ? losartan (COZAAR) 100 MG tablet Take 1 tablet (100 mg total) by mouth daily.  ? ?No facility-administered encounter medications on file as  of 01/06/2022.  ? ? ?Allergies (verified) ?Patient has no known allergies.  ? ?History: ?Past Medical History:  ?Diagnosis Date  ? Aortic stenosis   ? Ascending aortic aneurysm ([place]) 09/18/2018  ? Colon cancer ([place])   ? Family history of adverse reaction to anesthesia   ? pt states his mother had 2 episodes of hyponatremia following anesthesia   ? GERD (gastroesophageal reflux disease)   ? Headache   ? Heart murmur   ? Hyperlipidemia 10/25/2016  ? Hypertension   ? pt is currently not taking any medications; pt states is borderline  ? Melanoma ([place])    ? OSA on CPAP 09/18/2018  ? PAF (paroxysmal atrial fibrillation) ([place]) 06/13/2017  ? Perforated sigmoid colon ([place])   ? PVC's (premature ventricular contractions) 10/25/2016  ? Tinnitus   ? Wears glasses   ? ?Past Surgical History:  ?Procedure Laterality Date  ? AORTIC VALVE REPLACEMENT N/A 06/08/2017  ? Procedure: AORTIC VALVE REPLACEMENT (AVR);  Surgeon: [name], MD;  Location: [facility];  Service: Open Heart Surgery;  Laterality: N/A;  Using 31m Perimount Magna Ease Pericardial Bioprosthesis Aortic Valve  ? COLOSTOMY CLOSURE N/A 08/18/2016  ? Procedure: COLOSTOMY CLOSURE OPEN PROCEDURE;  Surgeon: TArmandina Gemma MD;  Location: [facility];  Service: General;  Laterality: N/A;  ? LAPAROTOMY N/A 04/25/2016  ? Procedure: EXPLORATORY LAPAROTOMY WITH SIGMOID COLECTOMY, COLOSTOMY;  Surgeon: TArmandina Gemma MD;  Location: [facility];  Service: General;  Laterality: N/A;  ? LEFT HEART CATH AND CORONARY ANGIOGRAPHY N/A 11/03/2016  ? Procedure: Left Heart Cath and Coronary Angiography;  Surgeon: CBurnell Blanks MD;  [facility];  Service: Cardiovascular;  Laterality: N/A;  ? MELANOMA EXCISION    ? PARTIAL COLECTOMY Right 08/18/2016  ? Procedure: RIGHT COLECTOMY;  Surgeon: TArmandina Gemma MD;  Location: [facility];  Service: General;  Laterality: Right;  ? TEE WITHOUT CARDIOVERSION N/A 06/08/2017  ? Procedure: TRANSESOPHAGEAL ECHOCARDIOGRAM (TEE);  Surgeon: BGaye Pollack MD;  [facility] [facility]  Service: Open Heart Surgery;  Laterality: N/A;  ? ?Family History  ?Problem Relation Age of Onset  ? Dementia Mother   ? Kidney disease Mother   ? Vascular Disease Mother   ? CAD Mother   ? Heart attack Father   ? Alcohol abuse Father   ? Cirrhosis Father   ? Stroke Maternal Grandmother   ? Stroke Paternal Grandmother   ? ?Social History  ? ?Socioeconomic History  ? Marital status: Married  ?  Spouse name: Not on file  ? Number of children: Not on file  ? Years of education: Not on file  ? Highest education level: Not on file   ?Occupational History  ? Occupation: Retired PSoftware engineer ?Tobacco Use  ? Smoking status: Never  ? Smokeless tobacco: Never  ?Vaping Use  ? Vaping Use: Never used  ?Substance and Sexual Activity  ? Alcohol use: Yes  ?  Alcohol/week: 21.0 - 24.0 standard drinks  ?  Types: 21 - 24 Standard drinks or equivalent per week  ?  Comment: 2-3 glasses of wine daily   ? Drug use: No  ? Sexual activity: Not on file  ?Other Topics Concern  ? Not on file  ?Social History Narrative  ? [place].  ?   ? Pt lives at home with his wife.  ?   ? 1 Cat   ? ?Social Determinants of Health  ? ?Financial Resource Strain: Low Risk   ?  Difficulty of Paying Living Expenses: Not hard at all  ?Food Insecurity: No Food Insecurity  ? Worried About Charity fundraiser in the Last Year: Never true  ? Ran Out of Food in the Last Year: Never true  ?Transportation Needs: No Transportation Needs  ? Lack of Transportation (Medical): No  ? Lack of Transportation (Non-Medical): No  ?Physical Activity: Insufficiently Active  ? Days of Exercise per Week: 4 days  ? Minutes of Exercise per Session: 30 min  ?Stress: No Stress Concern Present  ? Feeling of Stress : Not at all  ?Social Connections: Moderately Isolated  ? Frequency of Communication with Friends and Family: Three times a week  ? Frequency of Social Gatherings with Friends and Family: Three times a week  ? Attends Religious Services: Never  ? Active Member of Clubs or Organizations: No  ? Attends Archivist Meetings: Never  ? Marital Status: Married  ? ? ?Tobacco Counseling ?Counseling given: Not Answered ? ? ?Clinical Intake: ? ?Pre-visit preparation completed: Yes ? ?Pain : No/denies pain ? ?  ? ?Nutritional Risks: None ?Diabetes: No ? ?How often do you need to have someone help you when you read instructions, pamphlets, or other written materials from your doctor or pharmacy?: 1 - Never ?What is the last grade level you completed in school?:  PHAR D ? ?Diabetic?no  ? ?Interpreter Needed?: No ? ?Information entered by :: H.[email or web address],AGT ? ? ?Activities of Daily Living ? ?  01/06/2022  ?  9:40 AM 01/12/2021  ? 11:02 AM  ?In your present state of health, d

## 2022-01-17 DIAGNOSIS — Z20822 Contact with and (suspected) exposure to covid-19: Secondary | ICD-10-CM | POA: Diagnosis not present

## 2022-01-24 ENCOUNTER — Ambulatory Visit: Payer: Medicare Other | Admitting: Family Medicine

## 2022-02-02 ENCOUNTER — Other Ambulatory Visit (HOSPITAL_BASED_OUTPATIENT_CLINIC_OR_DEPARTMENT_OTHER): Payer: Self-pay | Admitting: Cardiovascular Disease

## 2022-02-02 NOTE — Telephone Encounter (Signed)
Rx(s) sent to pharmacy electronically.  

## 2022-02-08 DIAGNOSIS — H35353 Cystoid macular degeneration, bilateral: Secondary | ICD-10-CM | POA: Diagnosis not present

## 2022-02-08 DIAGNOSIS — H04123 Dry eye syndrome of bilateral lacrimal glands: Secondary | ICD-10-CM | POA: Diagnosis not present

## 2022-02-17 ENCOUNTER — Encounter (INDEPENDENT_AMBULATORY_CARE_PROVIDER_SITE_OTHER): Payer: Medicare Other | Admitting: Ophthalmology

## 2022-02-17 DIAGNOSIS — H33302 Unspecified retinal break, left eye: Secondary | ICD-10-CM

## 2022-02-17 DIAGNOSIS — H35033 Hypertensive retinopathy, bilateral: Secondary | ICD-10-CM

## 2022-02-17 DIAGNOSIS — H59033 Cystoid macular edema following cataract surgery, bilateral: Secondary | ICD-10-CM

## 2022-02-17 DIAGNOSIS — H43813 Vitreous degeneration, bilateral: Secondary | ICD-10-CM | POA: Diagnosis not present

## 2022-02-17 DIAGNOSIS — I10 Essential (primary) hypertension: Secondary | ICD-10-CM

## 2022-02-23 ENCOUNTER — Other Ambulatory Visit: Payer: Self-pay

## 2022-02-23 DIAGNOSIS — E039 Hypothyroidism, unspecified: Secondary | ICD-10-CM

## 2022-02-23 MED ORDER — LEVOTHYROXINE SODIUM 50 MCG PO TABS: 50.0000 ug | ORAL_TABLET | Freq: Every day | ORAL | 0 refills | Status: DC

## 2022-03-22 DIAGNOSIS — H04123 Dry eye syndrome of bilateral lacrimal glands: Secondary | ICD-10-CM | POA: Diagnosis not present

## 2022-03-22 DIAGNOSIS — H35353 Cystoid macular degeneration, bilateral: Secondary | ICD-10-CM | POA: Diagnosis not present

## 2022-05-03 ENCOUNTER — Encounter: Payer: Self-pay | Admitting: Family Medicine

## 2022-05-03 ENCOUNTER — Ambulatory Visit (INDEPENDENT_AMBULATORY_CARE_PROVIDER_SITE_OTHER): Payer: Medicare Other | Admitting: Family Medicine

## 2022-05-03 VITALS — BP 120/80 | HR 67 | Temp 97.1°F | Resp 16 | Ht 65.0 in | Wt 176.0 lb

## 2022-05-03 DIAGNOSIS — E039 Hypothyroidism, unspecified: Secondary | ICD-10-CM | POA: Diagnosis not present

## 2022-05-03 DIAGNOSIS — I10 Essential (primary) hypertension: Secondary | ICD-10-CM | POA: Diagnosis not present

## 2022-05-03 DIAGNOSIS — E7849 Other hyperlipidemia: Secondary | ICD-10-CM

## 2022-05-03 DIAGNOSIS — R7309 Other abnormal glucose: Secondary | ICD-10-CM

## 2022-05-03 LAB — LIPID PANEL
Cholesterol: 149 mg/dL (ref 0–200)
HDL: 43.5 mg/dL (ref >39.00)
LDL Cholesterol: 78 mg/dL (ref 0–99)
NonHDL: 105.07
Total CHOL/HDL Ratio: 3
Triglycerides: 135 mg/dL (ref 0.0–149.0)
VLDL: 27 mg/dL (ref 0.0–40.0)

## 2022-05-03 LAB — CBC WITH DIFFERENTIAL/PLATELET
Basophils Absolute: 0 10*3/uL (ref 0.0–0.1)
Basophils Relative: 0.6 % (ref 0.0–3.0)
Eosinophils Absolute: 0.2 10*3/uL (ref 0.0–0.7)
Eosinophils Relative: 3 % (ref 0.0–5.0)
HCT: 46.2 % (ref 39.0–52.0)
Hemoglobin: 15.9 g/dL (ref 13.0–17.0)
Lymphocytes Relative: 14.3 % (ref 12.0–46.0)
Lymphs Abs: 0.9 10*3/uL (ref 0.7–4.0)
MCHC: 34.4 g/dL (ref 30.0–36.0)
MCV: 97.1 fl (ref 78.0–100.0)
Monocytes Absolute: 0.7 10*3/uL (ref 0.1–1.0)
Monocytes Relative: 11.1 % (ref 3.0–12.0)
Neutro Abs: 4.3 10*3/uL (ref 1.4–7.7)
Neutrophils Relative %: 71 % (ref 43.0–77.0)
Platelets: 177 10*3/uL (ref 150.0–400.0)
RBC: 4.76 Mil/uL (ref 4.22–5.81)
RDW: 13.3 % (ref 11.5–15.5)
WBC: 6.1 10*3/uL (ref 4.0–10.5)

## 2022-05-03 LAB — BASIC METABOLIC PANEL
BUN: 18 mg/dL (ref 6–23)
CO2: 31 mEq/L (ref 19–32)
Calcium: 9.8 mg/dL (ref 8.4–10.5)
Chloride: 100 mEq/L (ref 96–112)
Creatinine, Ser: 1.15 mg/dL (ref 0.40–1.50)
GFR: 63.95 mL/min (ref >60.00)
Glucose, Bld: 115 mg/dL — ABNORMAL HIGH (ref 70–99)
Potassium: 5.1 mEq/L (ref 3.5–5.1)
Sodium: 138 mEq/L (ref 135–145)

## 2022-05-03 LAB — HEPATIC FUNCTION PANEL
ALT: 27 U/L (ref 0–53)
AST: 25 U/L (ref 0–37)
Albumin: 4.3 g/dL (ref 3.5–5.2)
Alkaline Phosphatase: 59 U/L (ref 39–117)
Bilirubin, Direct: 0.1 mg/dL (ref 0.0–0.3)
Total Bilirubin: 0.9 mg/dL (ref 0.2–1.2)
Total Protein: 6.5 g/dL (ref 6.0–8.3)

## 2022-05-03 LAB — TSH: TSH: 3.93 u[IU]/mL (ref 0.35–5.50)

## 2022-05-03 NOTE — Progress Notes (Signed)
   Subjective:    Patient ID: [name] Brown, male    DOB: 1951-02-04, 71 y.o.   MRN: [id number]  HPI HTN- chronic problem, on losartan '100mg'$  daily, Metoprolol XL '25mg'$  daily w/ good control.  Denies CP, SOB, HAs, edema.  Hyperlipidemia- chronic problem, on Pravastatin '20mg'$  daily and Zetia '10mg'$  daily.  Denies abd pain, N/V  Hypothyroid- chronic problem, on Levothyroxine 86mg daily.  Denies changes to energy level, skin/hair/nails.   Review of Systems For ROS see HPI     Objective:   Physical Exam Vitals reviewed.  Constitutional:      General: He is not in acute distress.    Appearance: Normal appearance. He is well-developed. He is not ill-appearing.  HENT:     Head: Normocephalic and atraumatic.  Eyes:     Extraocular Movements: Extraocular movements intact.     Conjunctiva/sclera: Conjunctivae normal.     Pupils: Pupils are equal, round, and reactive to light.  Neck:     Thyroid: No thyromegaly.  Cardiovascular:     Rate and Rhythm: Normal rate and regular rhythm.     Pulses: Normal pulses.     Heart sounds: Murmur (II/VI SEM) heard.  Pulmonary:     Effort: Pulmonary effort is normal. No respiratory distress.     Breath sounds: Normal breath sounds.  Abdominal:     General: Bowel sounds are normal. There is no distension.     Palpations: Abdomen is soft.  Musculoskeletal:     Cervical back: Normal range of motion and neck supple.     Right lower leg: No edema.     Left lower leg: No edema.  Lymphadenopathy:     Cervical: No cervical adenopathy.  Skin:    General: Skin is warm and dry.  Neurological:     General: No focal deficit present.     Mental Status: He is alert and oriented to person, place, and time.     Cranial Nerves: No cranial nerve deficit.  Psychiatric:        Mood and Affect: Mood normal.        Behavior: Behavior normal.           Assessment & Plan:

## 2022-05-03 NOTE — Assessment & Plan Note (Signed)
Chronic problem, on Losartan '100mg'$  daily, Metoprolol XL '25mg'$  daily w/ good control.  Currently asymptomatic.  Check labs due to ARB but no no anticipated med changes.

## 2022-05-03 NOTE — Assessment & Plan Note (Signed)
Chronic problem.  Currently on Levothyroxine 41mg daily.  Asymptomatic.  Check labs.  Adjust meds prn

## 2022-05-03 NOTE — Patient Instructions (Signed)
Follow up in 6 months to recheck HTN, Hyperlipidemia We'll notify you of your lab results and make any changes if needed Continue to work on healthy diet and regular exercise- you're doing great! Call with any questions or concerns Stay Safe!  Stay Healthy! Enjoy the rest of your summer!!

## 2022-05-03 NOTE — Assessment & Plan Note (Signed)
Chronic problem.  Currently on Pravastatin 20mg daily and Zetia 10mg daily w/o difficulty.  Check labs.  Adjust meds prn  

## 2022-05-04 ENCOUNTER — Other Ambulatory Visit (INDEPENDENT_AMBULATORY_CARE_PROVIDER_SITE_OTHER): Payer: Medicare Other

## 2022-05-04 DIAGNOSIS — R7309 Other abnormal glucose: Secondary | ICD-10-CM | POA: Diagnosis not present

## 2022-05-04 LAB — HEMOGLOBIN A1C: Hgb A1c MFr Bld: 5.5 % (ref 4.6–6.5)

## 2022-05-04 NOTE — Addendum Note (Signed)
Addended by: Patrcia Dolly on: 05/04/2022 09:34 AM   Modules accepted: Orders

## 2022-05-26 ENCOUNTER — Other Ambulatory Visit: Payer: Self-pay | Admitting: Family Medicine

## 2022-05-26 DIAGNOSIS — E039 Hypothyroidism, unspecified: Secondary | ICD-10-CM

## 2022-05-26 MED ORDER — LEVOTHYROXINE SODIUM 50 MCG PO TABS: 50.0000 ug | ORAL_TABLET | Freq: Every day | ORAL | 0 refills | Status: DC

## 2022-06-21 DIAGNOSIS — Z23 Encounter for immunization: Secondary | ICD-10-CM | POA: Diagnosis not present

## 2022-06-27 DIAGNOSIS — H35371 Puckering of macula, right eye: Secondary | ICD-10-CM | POA: Diagnosis not present

## 2022-07-18 ENCOUNTER — Other Ambulatory Visit (HOSPITAL_BASED_OUTPATIENT_CLINIC_OR_DEPARTMENT_OTHER): Payer: Self-pay | Admitting: Cardiovascular Disease

## 2022-07-23 DIAGNOSIS — Z23 Encounter for immunization: Secondary | ICD-10-CM | POA: Diagnosis not present

## 2022-07-29 ENCOUNTER — Other Ambulatory Visit: Payer: Self-pay | Admitting: Cardiovascular Disease

## 2022-07-31 ENCOUNTER — Other Ambulatory Visit (HOSPITAL_BASED_OUTPATIENT_CLINIC_OR_DEPARTMENT_OTHER): Payer: Self-pay | Admitting: Cardiovascular Disease

## 2022-08-01 NOTE — Telephone Encounter (Signed)
Rx(s) sent to pharmacy electronically.  

## 2022-08-06 ENCOUNTER — Other Ambulatory Visit: Payer: Self-pay | Admitting: Cardiovascular Disease

## 2022-08-07 ENCOUNTER — Other Ambulatory Visit (HOSPITAL_BASED_OUTPATIENT_CLINIC_OR_DEPARTMENT_OTHER): Payer: Self-pay | Admitting: Cardiovascular Disease

## 2022-08-24 ENCOUNTER — Other Ambulatory Visit: Payer: Self-pay

## 2022-08-24 DIAGNOSIS — E039 Hypothyroidism, unspecified: Secondary | ICD-10-CM

## 2022-08-24 MED ORDER — LEVOTHYROXINE SODIUM 50 MCG PO TABS: 50.0000 ug | ORAL_TABLET | Freq: Every day | ORAL | 0 refills | Status: DC

## 2022-08-29 DIAGNOSIS — L304 Erythema intertrigo: Secondary | ICD-10-CM | POA: Diagnosis not present

## 2022-08-29 DIAGNOSIS — D2272 Melanocytic nevi of left lower limb, including hip: Secondary | ICD-10-CM | POA: Diagnosis not present

## 2022-08-29 DIAGNOSIS — L820 Inflamed seborrheic keratosis: Secondary | ICD-10-CM | POA: Diagnosis not present

## 2022-08-29 DIAGNOSIS — L918 Other hypertrophic disorders of the skin: Secondary | ICD-10-CM | POA: Diagnosis not present

## 2022-08-29 DIAGNOSIS — L309 Dermatitis, unspecified: Secondary | ICD-10-CM | POA: Diagnosis not present

## 2022-08-29 DIAGNOSIS — L821 Other seborrheic keratosis: Secondary | ICD-10-CM | POA: Diagnosis not present

## 2022-08-29 DIAGNOSIS — D2271 Melanocytic nevi of right lower limb, including hip: Secondary | ICD-10-CM | POA: Diagnosis not present

## 2022-08-29 DIAGNOSIS — D2262 Melanocytic nevi of left upper limb, including shoulder: Secondary | ICD-10-CM | POA: Diagnosis not present

## 2022-08-29 DIAGNOSIS — D225 Melanocytic nevi of trunk: Secondary | ICD-10-CM | POA: Diagnosis not present

## 2022-08-29 DIAGNOSIS — Z85820 Personal history of malignant melanoma of skin: Secondary | ICD-10-CM | POA: Diagnosis not present

## 2022-08-29 DIAGNOSIS — D2261 Melanocytic nevi of right upper limb, including shoulder: Secondary | ICD-10-CM | POA: Diagnosis not present

## 2022-09-16 ENCOUNTER — Other Ambulatory Visit (HOSPITAL_BASED_OUTPATIENT_CLINIC_OR_DEPARTMENT_OTHER): Payer: Self-pay | Admitting: Cardiovascular Disease

## 2022-09-16 NOTE — Telephone Encounter (Signed)
Rx request sent to pharmacy.  

## 2022-10-13 ENCOUNTER — Encounter (HOSPITAL_BASED_OUTPATIENT_CLINIC_OR_DEPARTMENT_OTHER): Payer: Self-pay | Admitting: Cardiovascular Disease

## 2022-10-13 ENCOUNTER — Ambulatory Visit (INDEPENDENT_AMBULATORY_CARE_PROVIDER_SITE_OTHER): Payer: Medicare Other | Admitting: Cardiovascular Disease

## 2022-10-13 ENCOUNTER — Other Ambulatory Visit (HOSPITAL_BASED_OUTPATIENT_CLINIC_OR_DEPARTMENT_OTHER): Payer: Self-pay | Admitting: *Deleted

## 2022-10-13 VITALS — BP 132/86 | HR 63 | Ht 65.0 in | Wt 182.8 lb

## 2022-10-13 DIAGNOSIS — R0609 Other forms of dyspnea: Secondary | ICD-10-CM

## 2022-10-13 MED ORDER — DILTIAZEM HCL ER COATED BEADS 120 MG PO CP24: 120.0000 mg | ORAL_CAPSULE | Freq: Every day | ORAL | 3 refills | Status: DC

## 2022-10-13 NOTE — Progress Notes (Signed)
Cardiology Office Note   Date:  10/13/2022   ID:  [name] Brown, DOB 1951-02-01, MRN [id number]  PCP:  [name], MD  Cardiologist:  Skeet Latch, MD  Electrophysiologist:  None   Evaluation Performed:  Follow-Up Visit  Chief Complaint:  Follow up  History of Present Illness:    [name] Brown is a 72 y.o. male with non-obstructive CAD, chronic systolic and diastolic heart failure (LVEF 30-35% improved to 60-65%%), aortic stenosis s/p bioprosthetic aortic valve,  and colon cancer s/p R colectomy who presents for follow up.  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  Dr. Gwyneth Brown saw Dr. Annye Brown on 09/23/16 and was noted to have coronary atherosclerosis on chest xray.  He had an echo 10/12/16 that revealed LVEF 30-35% with grade 1 diastolic dysfunction.  His aortic valve was heavily calcified and likely bicuspid.  Although the mean gradient was 14 mmHg, it was felt that he likely had moderate aortic stenosis given his reduced systolic function and leaflet restriction.   He was started on aspirin and referred to cardiology for evaluation.  He was referred for cardiac catheterization 10/2016 that revealed mild-mod CAD and LVEF 45-50%. [name] was referred for a repeat echo 01/17/17 that revealed LVEF 30-35% with grade 1 diastolic dysfunction. There was fusion of the right and noncoronary cusps and leaflet mobility was severely restricted. The mean gradient was 16 mmHg.    He had a dobutamine stress echo where his gradient increased to 38 mmHg with dobutamine infusion.  He underwent AVR with a 69m Edwards Magna-Ease pericardial valve 06/08/17.  His hospitalization was complicated by atrial fibrillation and he was started on amiodarone which was discontinued 3 months post-op.  Mr. BKeislinghad a repeat echocardiogram 03/2019 that revealed LVEF 60 to 65%.  The mean gradient across his aortic valve was 13 mmHg.  There has been concern by echo  for an ascending aortic aneurysm up to 4.3 cm.  He was referred for CTA that revealed his ascending aorta was 3.6 cm.  Mr. BNocitowas treated for colon cancer with surgery only and did not require chemotherapy.  He was admitted to the hospital 12/2020 with a small bowel obstruction.  He improved with NG tube placement and conservative management.  At his last appointment he is doing well.  Blood pressure was slightly above goal and losartan and metoprolol were both increased.  He had a repeat echo 07/2021 that revealed LVEF 55 to 60% with grade 1 diastolic dysfunction.  Aortic valve mean gradient was 15 mmHg.  Ascending aorta was 3.9 cm.   Mr. BRagasreports experiencing a decline in stamina, particularly noticeable during his workouts on the elliptical machine and treadmill. He indicates that his strength has not been increasing and that he feels more fatigued during these exercises. He has not observed any chest pressure, heaviness, or swelling in his extremities, and he denies any shortness of breath while in a recumbent position.  The patient has noted an increase in fatigue, with a heightened tendency to take naps in the afternoon over the past six months. He has expressed concern about his thyroid function, with his last assessment in August showing levels within the normal range.  Mr. BGannhas been monitoring his blood pressure at home, with readings showing variability, including 118/77, 129/72, and 120/77. He has a documented history of palpitations, which occurred when his metoprolol dosage was decreased the previous year. Additionally, he has  a known allergy to shellfish and requires pre-medications for specific medical procedures.  Past Medical History:  Diagnosis Date   Aortic stenosis    Ascending aortic aneurysm ([place]) 09/18/2018   Colon cancer (HCC)    Family history of adverse reaction to anesthesia    pt states his mother had 2 episodes of hyponatremia following anesthesia    GERD  (gastroesophageal reflux disease)    Headache    Heart murmur    Hyperlipidemia 10/25/2016   Hypertension    pt is currently not taking any medications; pt states is borderline   Melanoma ([place])    OSA on CPAP 09/18/2018   PAF (paroxysmal atrial fibrillation) ([place]) 06/13/2017   Perforated sigmoid colon (HCC)    PVC's (premature ventricular contractions) 10/25/2016   Tinnitus    Wears glasses    Past Surgical History:  Procedure Laterality Date   AORTIC VALVE REPLACEMENT N/A 06/08/2017   Procedure: AORTIC VALVE REPLACEMENT (AVR);  Surgeon: [name], MD;  Location: [facility];  Service: Open Heart Surgery;  Laterality: N/A;  Using 40m Perimount Magna Ease Pericardial Bioprosthesis Aortic Valve   COLOSTOMY CLOSURE N/A 08/18/2016   Procedure: COLOSTOMY CLOSURE OPEN PROCEDURE;  Surgeon: TArmandina Gemma MD;  Location: [facility];  Service: General;  Laterality: N/A;   LAPAROTOMY N/A 04/25/2016   Procedure: EXPLORATORY LAPAROTOMY WITH SIGMOID COLECTOMY, COLOSTOMY;  Surgeon: TArmandina Gemma MD;  Location: [facility];  Service: General;  Laterality: N/A;   LEFT HEART CATH AND CORONARY ANGIOGRAPHY N/A 11/03/2016   Procedure: Left Heart Cath and Coronary Angiography;  Surgeon: CBurnell Blanks MD;  [facility];  Service: Cardiovascular;  Laterality: N/A;   MELANOMA EXCISION     PARTIAL COLECTOMY Right 08/18/2016   Procedure: RIGHT COLECTOMY;  Surgeon: TArmandina Gemma MD;  Location: [facility];  Service: General;  Laterality: Right;   TEE WITHOUT CARDIOVERSION N/A 06/08/2017   Procedure: TRANSESOPHAGEAL ECHOCARDIOGRAM (TEE);  Surgeon: BGaye Pollack MD;  Location: [facility]  Service: Open Heart Surgery;  Laterality: N/A;       Current Outpatient Medications:    aspirin EC 81 MG tablet, Take 81 mg by mouth daily., Disp: , Rfl:    diltiazem (CARDIZEM CD) 120 MG 24 hr capsule, Take 1 capsule (120 mg total) by mouth daily., Disp: 90 capsule, Rfl: 3   ezetimibe (ZETIA) 10 MG tablet, Take 1 tablet (10 mg  total) by mouth daily. Please keep your upcoming appointment for refills., Disp: 30 tablet, Rfl: 1   famotidine (PEPCID) 20 MG tablet, Take 20 mg by mouth daily as needed for heartburn or indigestion., Disp: , Rfl:    levothyroxine (SYNTHROID) 50 MCG tablet, Take 1 tablet (50 mcg total) by mouth daily., Disp: 90 tablet, Rfl: 0   losartan (COZAAR) 100 MG tablet, Take 1 tablet (100 mg total) by mouth daily. KEEP APPOINTMENT FOR FUTURE REFILLS, Disp: 90 tablet, Rfl: 0   pravastatin (PRAVACHOL) 20 MG tablet, Take 1 tablet (20 mg total) by mouth daily. NEED APPOINTMENT, Disp: 90 tablet, Rfl: 0   rOPINIRole (REQUIP) 0.25 MG tablet, Take 3 tablets (0.75 mg total) by mouth at bedtime., Disp: 270 tablet, Rfl: 3   tadalafil (CIALIS) 20 MG tablet, Take 20 mg by mouth daily as needed for erectile dysfunction., Disp: , Rfl:    Varenicline Tartrate (TYRVAYA) 0.03 MG/ACT SOLN, Place into the nose., Disp: , Rfl:   Allergies:   Patient has no known allergies.   Social History   Tobacco Use  Smoking status: Never   Smokeless tobacco: Never  Vaping Use   Vaping Use: Never used  Substance Use Topics   Alcohol use: Yes    Alcohol/week: 21.0 - 24.0 standard drinks of alcohol    Types: 21 - 24 Standard drinks or equivalent per week    Comment: 2-3 glasses of wine daily    Drug use: No     Family Hx: The patient's family history includes Alcohol abuse in his father; CAD in his mother; Cirrhosis in his father; Dementia in his mother; Heart attack in his father; Kidney disease in his mother; Stroke in his maternal grandmother and paternal grandmother; Vascular Disease in his mother.  ROS:   Please see the history of present illness.    All other systems reviewed and are negative.   Prior CV studies:   The following studies were reviewed today:  Echo 03/2019: IMPRESSIONS    1. The left ventricle has normal systolic function with an ejection fraction of 60-65%. The cavity size was normal. Left  ventricular diastolic Doppler parameters are consistent with impaired relaxation.  2. The right ventricle has normal systolic function. The cavity was normal. There is no increase in right ventricular wall thickness.   SUMMARY   When compared to the prior study from12/19/2019 there is no significant change. Transaortic gradients across the bioprosthetic valve remain within upper normal limit.  Echo 01/17/17: Study Conclusions   - Left ventricle: The cavity size was normal. Wall thickness was   normal. Systolic function was moderately to severely reduced. The   estimated ejection fraction was in the range of 30% to 35%.   Moderate diffuse hypokinesis with no identifiable regional   variations. Doppler parameters are consistent with abnormal left   ventricular relaxation (grade 1 diastolic dysfunction). - Ventricular septum: Septal motion showed paradox. These changes   are consistent with a left bundle branch block. - Aortic valve: Possibly bicuspid; severely thickened, severely   calcified leaflets; fusion of the right-noncoronary commissure.   Anterior cusp mobility was severely restricted. Transvalvular   velocity was increased, due to low cardiac output. There was   moderate to severe stenosis. There was mild regurgitation. - Right ventricle: Systolic function was mildly reduced.   Impressions:   - Suspect low gradient severe aortic stenosis.   Recommendations:  Consider dobutamine echo.   LHC 11/03/16:   Mid RCA to Dist RCA lesion, 10 %stenosed. RPDA lesion, 10 %stenosed. Ost Cx to Prox Cx lesion, 40 %stenosed. Ost Ramus to Ramus lesion, 20 %stenosed. Mid LAD lesion, 20 %stenosed. Prox LAD lesion, 30 %stenosed. The left ventricular ejection fraction is 45-50% by visual estimate. There is mild left ventricular systolic dysfunction. LV end diastolic pressure is normal. There is no mitral valve regurgitation.  Labs/Other Tests and Data Reviewed:    EKG:  An ECG dated  07/02/2020 was personally reviewed today and demonstrated:  Sinus bradycardia.  Rate 53 bpm.  Prior anteroseptal infarct. 07/26/21: Sinus bradycardia.  Rate 58 bpm.  PVCs.  Cannot rule out prior anteroseptal and inferior infarcts.   Recent Labs: 05/03/2022: ALT 27; BUN 18; Creatinine, Ser 1.15; Hemoglobin 15.9; Platelets 177.0; Potassium 5.1; Sodium 138; TSH 3.93   Recent Lipid Panel Lab Results  Component Value Date/Time   CHOL 149 05/03/2022 09:24 AM   CHOL 166 09/01/2020 09:25 AM   TRIG 135.0 05/03/2022 09:24 AM   HDL 43.50 05/03/2022 09:24 AM   HDL 58 09/01/2020 09:25 AM   CHOLHDL 3 05/03/2022 09:24 AM  [place] 78 05/03/2022 09:24 AM   LDLCALC 92 09/01/2020 09:25 AM   LDLDIRECT 92.0 10/09/2019 11:17 AM    Wt Readings from Last 3 Encounters:  10/13/22 182 lb 12.8 oz (82.9 kg)  05/03/22 176 lb (79.8 kg)  11/04/21 179 lb 9.6 oz (81.5 kg)     Objective:    VS:  BP 132/86   Pulse 63   Ht '5\' 5"'$  (1.651 m)   Wt 182 lb 12.8 oz (82.9 kg)   BMI 30.42 kg/m  , BMI Body mass index is 30.42 kg/m. GENERAL:  Well appearing HEENT: Pupils equal round and reactive, fundi not visualized, oral mucosa unremarkable NECK:  No jugular venous distention, waveform within normal limits, carotid upstroke brisk and symmetric, no bruits LUNGS:  Clear to auscultation bilaterally HEART:  RRR.  PMI not displaced or sustained,S1 and S2 within normal limits, no S3, no S4, no clicks, no rubs, III/VI systolic murmur ABD:  Flat, positive bowel sounds normal in frequency in pitch, no bruits, no rebound, no guarding, no midline pulsatile mass, no hepatomegaly, no splenomegaly EXT:  2 plus pulses throughout, no edema, no cyanosis no clubbing SKIN:  No rashes no nodules NEURO:  Cranial nerves II through XII grossly intact, motor grossly intact throughout PSYCH:  Cognitively intact, oriented to person place and time   ASSESSMENT & PLAN:    # Chronic systolic and diastolic heart failure:   LVEF has increased  to 60 to 65% status post aortic valve replacement.  He is doing well and remains euvolemic.  Continue losartan.  Will stop metoprolol due to weight gain and fatigue.    # Exertional dyspnea: # CAD, native:  # Hyperlipidemia: He has known non-obstructive CAD.  Also has a h/o HFrEF, but his LVEF has recovered and he has no other heart failure symptoms.     - Schedule an exercise nuclear stress test to evaluate for any underlying cardiac issues.    - Hold metoprolol prior to the stress test as instructed.    - Follow up in a couple of months or sooner if there are any abnormal findings on the stress test.    - Continue Zetia and pravastatin  # Decreased stamina and afternoon fatigue:    - Discontinue metoprolol and initiate diltiazem 120 mg daily to assess if this change improves stamina and fatigue.    - Consider re-evaluating thyroid levels if fatigue persists despite the medication change. - continue CPAP  # Palpitations:    - Monitor the response to diltiazem for control of palpitations.    - Reassess the need for further intervention if palpitations persist or worsen.     -In the past he has been unable to be on lower doses of metoprolol due to increase in PVCs.  # Hypertension:  Blood pressure was above goal here but has been much better controlled at home.  He has history of whitecoat hypertension superimposed on essential hypertension.  Continue losartan and switching metoprolol to diltiazem as above..  # Postoperative atrial fibrillation: No recurrent atrial fibrillation.  He is not on anticoagulation chronically.   # Mild-moderate AS:   # Bicuspid aortic valve s/p bioprosthetic AVR: Mr. Grim is doing well after aortic valve replacement.  Gradients were mildly elevated on his postoperative echo.  Mean gradient was 15 mmHg on his most recent echo.    Shared Decision Making/Informed Consent  The risks [chest pain, shortness of breath, cardiac arrhythmias, dizziness, blood  pressure fluctuations, myocardial infarction, stroke/transient ischemic attack,  nausea, vomiting, allergic reaction, radiation exposure, metallic taste sensation and life-threatening complications (estimated to be 1 in 10,000)], benefits (risk stratification, diagnosing coronary artery disease, treatment guidance) and alternatives of a nuclear stress test were discussed in detail with Mr. Salomon and he agrees to proceed.   Medication Adjustments/Labs and Tests Ordered: Current medicines are reviewed at length with the patient today.  Concerns regarding medicines are outlined above.   Tests Ordered: Orders Placed This Encounter  Procedures   MYOCARDIAL PERFUSION IMAGING   EKG 12-Lead     Medication Changes: Meds ordered this encounter  Medications   diltiazem (CARDIZEM CD) 120 MG 24 hr capsule    Sig: Take 1 capsule (120 mg total) by mouth daily.    Dispense:  90 capsule    Refill:  3    D/C METOPROLOL     Follow Up: in 2 months  Signed, Skeet Latch, MD  10/13/2022 10:20 AM    [place]

## 2022-10-13 NOTE — Telephone Encounter (Signed)
Please see message for potential addendum to clinic note

## 2022-10-13 NOTE — Patient Instructions (Addendum)
Medication Instructions:  STOP METOPROLOL   START DILTIAZEM 120 MG DAILY   *If you need a refill on your cardiac medications before your next appointment, please call your pharmacy*  Lab Work: NONE  Testing/Procedures: Your physician has requested that you have en exercise stress myoview. For further information please visit [email or web address]. Please follow instruction sheet, as given. DO NOT TAKE DILTIAZEM MORNING OF   Follow-Up: At [facility], you and your health needs are our priority.  As part of our continuing mission to provide you with exceptional heart care, we have created designated Provider Care Teams.  These Care Teams include your primary Cardiologist (physician) and Advanced Practice Providers (APPs -  Physician Assistants and Nurse Practitioners) who all work together to provide you with the care you need, when you need it.  We recommend signing up for the patient portal called "MyChart".  Sign up information is provided on this After Visit Summary.  MyChart is used to connect with patients for Virtual Visits (Telemedicine).  Patients are able to view lab/test results, encounter notes, upcoming appointments, etc.  Non-urgent messages can be sent to your provider as well.   To learn more about what you can do with MyChart, go to [email or web address].    Your next appointment:   2 month(s)  Provider:   Skeet Latch, MD or Laurann Montana, NP   Your Physician has requested you have a Myocardial Perfusion Imaging Study.    Please arrive 15 minutes prior to your appointment time for registration and insurance purposes.   The test will take approximately 3 to 4 hours to complete; you may bring reading material. If someone comes with you to your appointment, they will need to remain in the main lobby due to limited testing space in the testing area.  How to prepare for your test:  - Do not eat or drink 3 hours prior to your test, except you may have water   - Do not consume products containing caffeine ( regular or decaf) 12 hours prior to your test ( coffee, chocolate, sodas or teas)  - Do bring a list of your current medications with you. If not listed below, you may take your medications as normal  - Do wear comfortable clothes (no dresses or overalls) and walking shoes ( tennis shoes preferred), no heel or open toe shoes are allowed - Do not wear cologne, perfume, aftershave, or lotions ( deodorant is allowed)  - If these instructions are not followed, your test will be rescheduled   If you cannot keep your appointment, please provide 24 hours notice to the Nuclear Lab, to avoid a possible $50 charge to your account!

## 2022-10-18 ENCOUNTER — Telehealth (HOSPITAL_COMMUNITY): Payer: Self-pay | Admitting: *Deleted

## 2022-10-18 NOTE — Telephone Encounter (Signed)
Pt reached and given detailed instructions for MPI study scheduled on 10/20/22.

## 2022-10-20 ENCOUNTER — Ambulatory Visit (HOSPITAL_COMMUNITY): Payer: Medicare Other | Attending: Cardiovascular Disease

## 2022-10-20 DIAGNOSIS — R0609 Other forms of dyspnea: Secondary | ICD-10-CM

## 2022-10-20 LAB — MYOCARDIAL PERFUSION IMAGING
Angina Index: 0
Duke Treadmill Score: 10
Estimated workload: 11.2
Exercise duration (min): 10 min
Exercise duration (sec): 10 s
LV dias vol: 94 mL (ref 62–150)
LV sys vol: 45 mL
MPHR: 148 {beats}/min
Nuc Stress EF: 51 %
Peak HR: 133 {beats}/min
Percent HR: 89 %
Rest HR: 61 {beats}/min
Rest Nuclear Isotope Dose: 10.2 mCi
SDS: 2
SRS: 2
SSS: 4
ST Depression (mm): 0 mm
Stress Nuclear Isotope Dose: 32.6 mCi
TID: 0.87

## 2022-10-20 MED ORDER — TECHNETIUM TC 99M TETROFOSMIN IV KIT
10.2000 | PACK | Freq: Once | INTRAVENOUS | Status: AC | PRN
  Administered 2022-10-20: 10.2 via INTRAVENOUS

## 2022-10-20 MED ORDER — TECHNETIUM TC 99M TETROFOSMIN IV KIT
32.6000 | PACK | Freq: Once | INTRAVENOUS | Status: AC | PRN
  Administered 2022-10-20: 32.6 via INTRAVENOUS

## 2022-10-25 ENCOUNTER — Other Ambulatory Visit: Payer: Self-pay | Admitting: Cardiovascular Disease

## 2022-10-26 NOTE — Telephone Encounter (Signed)
Rx(s) sent to pharmacy electronically.  

## 2022-10-31 NOTE — Progress Notes (Unsigned)
PATIENT: [name] Brown DOB: Feb 12, 1951  REASON FOR VISIT: follow up HISTORY FROM: patient  No chief complaint on file.    HISTORY OF PRESENT ILLNESS:  10/31/22 ALL:  [name] Brown returns for follow up for OSA on CPAP and RLS. He continues ropinirole 0.82m daily.   10/28/2021 ALL: [name] Brown a 72y.o. male here today for follow up for OSA on CPAP and RLS. He continues ropinirole  0.764mat bedtime. He feels this works well to manage symptoms at night. He does have some discomfort in the evenings before bedtime. He is tolerating meds well with no adverse effects. He continues consistent use of CPAP. He is using a F20 FFM. It does push against his lower eyes but has tried multiple other masks that were not a good fit for him. He is followed by ophthalmology. He is using Restasis and eye drops for dry eyes.     HISTORY: (copied from Dr AtGuadelupe Sabinrevious note)  [name] Brown a 89ear old right-handed gentleman with an underlying medical history of aortic stenosis with heart murmur, hypertension, hyperlipidemia, history of melanoma, history of paroxysmal A. fib, reflux disease, history of perforated sigmoid colon, history of PVCs, tinnitus, and mildly overweight state, who presents for follow-up consultation of his RLS and OSA, on treatment with CPAP therapy. The patient is unaccompanied today and presents for his yearly check up.  I last saw him on 09/18/2019, at which time he was fully compliant with his CPAP.  He was able to tolerate Requip.  Requip was helpful for his restless leg symptoms.  He was doing well with regards to his sleep apnea treatment with CPAP.  He was advised to follow-up routinely in 1 year.  He was advised to continue with ropinirole 0.75 mg each night.   Today, 09/22/2020: I reviewed his CPAP compliance data from the past 30 days from 08/22/2020 through 09/20/2020, during which time he used his machine every night with percent use days greater than 4 hours at 100%, indicating  superb compliance with an average usage of 8 hours and 3 minutes, residual AHI at goal at 2.1/h, leak on the low side with a 95th percentile at 1.2 L/min on a pressure of 9 cm without EPR.  He reports doing well with his CPAP.  He is using a small mask, the medium was too large for him.  He is very consistent with the usage and does not mind using CPAP long-term.  Ropinirole continues to help with his leg twitching and he takes it every night around 9 PM, he is in bed generally between 1030 and 11 PM.  He has noticed no side effects with the ropinirole, in particular, no significant swelling in the lower extremities, no sleepiness, no abnormal behaviors noted.  He sees his cardiologist twice a year and primary care also twice a year.  He had an increase in his pravastatin.   REVIEW OF SYSTEMS: Out of a complete 14 system review of symptoms, the patient complains only of the following symptoms, RLS and all other reviewed systems are negative.  ESS: 6  ALLERGIES: No Known Allergies  HOME MEDICATIONS: Outpatient Medications Prior to Visit  Medication Sig Dispense Refill   aspirin EC 81 MG tablet Take 81 mg by mouth daily.     diltiazem (CARDIZEM CD) 120 MG 24 hr capsule Take 1 capsule (120 mg total) by mouth daily. 90 capsule 3   ezetimibe (ZETIA) 10 MG tablet Take 1 tablet (10 mg total) by mouth  daily. Please keep your upcoming appointment for refills. 30 tablet 1   famotidine (PEPCID) 20 MG tablet Take 20 mg by mouth daily as needed for heartburn or indigestion.     levothyroxine (SYNTHROID) 50 MCG tablet Take 1 tablet (50 mcg total) by mouth daily. 90 tablet 0   losartan (COZAAR) 100 MG tablet TAKE 1 TABLET BY MOUTH DAILY 90 tablet 0   pravastatin (PRAVACHOL) 20 MG tablet TAKE 1 TABLET BY MOUTH EVERY DAY.  NEED APPOINTMENT 90 tablet 0   rOPINIRole (REQUIP) 0.25 MG tablet Take 3 tablets (0.75 mg total) by mouth at bedtime. 270 tablet 3   tadalafil (CIALIS) 20 MG tablet Take 20 mg by mouth daily  as needed for erectile dysfunction.     Varenicline Tartrate (TYRVAYA) 0.03 MG/ACT SOLN Place into the nose.     No facility-administered medications prior to visit.    PAST MEDICAL HISTORY: Past Medical History:  Diagnosis Date   Aortic stenosis    Ascending aortic aneurysm ([place]) 09/18/2018   Colon cancer (HCC)    Family history of adverse reaction to anesthesia    pt states his mother had 2 episodes of hyponatremia following anesthesia    GERD (gastroesophageal reflux disease)    Headache    Heart murmur    Hyperlipidemia 10/25/2016   Hypertension    pt is currently not taking any medications; pt states is borderline   Melanoma ([place])    OSA on CPAP 09/18/2018   PAF (paroxysmal atrial fibrillation) ([place]) 06/13/2017   Perforated sigmoid colon (HCC)    PVC's (premature ventricular contractions) 10/25/2016   Tinnitus    Wears glasses     PAST SURGICAL HISTORY: Past Surgical History:  Procedure Laterality Date   AORTIC VALVE REPLACEMENT N/A 06/08/2017   Procedure: AORTIC VALVE REPLACEMENT (AVR);  Surgeon: [name], MD;  Location: [facility];  Service: Open Heart Surgery;  Laterality: N/A;  Using 13m Perimount Magna Ease Pericardial Bioprosthesis Aortic Valve   COLOSTOMY CLOSURE N/A 08/18/2016   Procedure: COLOSTOMY CLOSURE OPEN PROCEDURE;  Surgeon: TArmandina Gemma MD;  Location: [facility];  Service: General;  Laterality: N/A;   LAPAROTOMY N/A 04/25/2016   Procedure: EXPLORATORY LAPAROTOMY WITH SIGMOID COLECTOMY, COLOSTOMY;  Surgeon: TArmandina Gemma MD;  Location: [facility];  Service: General;  Laterality: N/A;   LEFT HEART CATH AND CORONARY ANGIOGRAPHY N/A 11/03/2016   Procedure: Left Heart Cath and Coronary Angiography;  Surgeon: CBurnell Blanks MD;  [facility];  Service: Cardiovascular;  Laterality: N/A;   MELANOMA EXCISION     PARTIAL COLECTOMY Right 08/18/2016   Procedure: RIGHT COLECTOMY;  Surgeon: TArmandina Gemma MD;  Location: [facility];  Service: General;  Laterality:  Right;   TEE WITHOUT CARDIOVERSION N/A 06/08/2017   Procedure: TRANSESOPHAGEAL ECHOCARDIOGRAM (TEE);  Surgeon: BGaye Pollack MD;  [facility]  Service: Open Heart Surgery;  Laterality: N/A;    FAMILY HISTORY: Family History  Problem Relation Age of Onset   Dementia Mother    Kidney disease Mother    Vascular Disease Mother    CAD Mother    Heart attack Father    Alcohol abuse Father    Cirrhosis Father    Stroke Maternal Grandmother    Stroke Paternal Grandmother     SOCIAL HISTORY: Social History   Socioeconomic History   Marital status: Married    Spouse name: Not on file   Number of children: Not on file   Years of education: Not on file  Highest education level: Not on file  Occupational History   Occupation: Retired Software engineer  Tobacco Use   Smoking status: Never   Smokeless tobacco: Never  Vaping Use   Vaping Use: Never used  Substance and Sexual Activity   Alcohol use: Yes    Alcohol/week: 21.0 - 24.0 standard drinks of alcohol    Types: 21 - 24 Standard drinks or equivalent per week    Comment: 2-3 glasses of wine daily    Drug use: No   Sexual activity: Not on file  Other Topics Concern   Not on file  Social History Narrative   Pt lives in [place] with his wife, they are active.      Pt lives at home with his wife.      1 Cat    Social Determinants of Health   Financial Resource Strain: Low Risk  (01/06/2022)   Overall Financial Resource Strain (CARDIA)    Difficulty of Paying Living Expenses: Not hard at all  Food Insecurity: No Food Insecurity (01/06/2022)   Hunger Vital Sign    Worried About Running Out of Food in the Last Year: Never true    Ran Out of Food in the Last Year: Never true  Transportation Needs: No Transportation Needs (01/06/2022)   PRAPARE - Hydrologist (Medical): No    Lack of Transportation (Non-Medical): No  Physical Activity: Insufficiently Active (01/06/2022)   Exercise Vital Sign     Days of Exercise per Week: 4 days    Minutes of Exercise per Session: 30 min  Stress: No Stress Concern Present (01/06/2022)   [place]    Feeling of Stress : Not at all  Social Connections: Moderately Isolated (01/06/2022)   Social Connection and Isolation Panel [NHANES]    Frequency of Communication with Friends and Family: Three times a week    Frequency of Social Gatherings with Friends and Family: Three times a week    Attends Religious Services: Never    Active Member of Clubs or Organizations: No    Attends Archivist Meetings: Never    Marital Status: Married  Human resources officer Violence: Not At Risk (01/06/2022)   Humiliation, Afraid, Rape, and Kick questionnaire    Fear of Current or Ex-Partner: No    Emotionally Abused: No    Physically Abused: No    Sexually Abused: No     PHYSICAL EXAM  There were no vitals filed for this visit.  There is no height or weight on file to calculate BMI.  Generalized: Well developed, in no acute distress  Cardiology: normal rate and rhythm, no murmur noted Respiratory: clear to auscultation bilaterally  Neurological examination  Mentation: Alert oriented to time, place, history taking. Follows all commands speech and language fluent Cranial nerve II-XII: Pupils were equal round reactive to light. Extraocular movements were full, visual field were full  Motor: The motor testing reveals 5 over 5 strength of all 4 extremities. Good symmetric motor tone is noted throughout.  Gait and station: Gait is normal.    DIAGNOSTIC DATA (LABS, IMAGING, TESTING) - I reviewed patient records, labs, notes, testing and imaging myself where available.     03/28/2018    8:17 AM  MMSE - Mini Mental State Exam  Orientation to time 5  Orientation to Place 5  Registration 3  Attention/ Calculation 5  Recall 3  Language- name 2 objects 2  Language- repeat 1  Language- follow  3 step command 3  Language- read & follow direction 1  Write a sentence 1  Copy design 1  Total score 30     Lab Results  Component Value Date   WBC 6.1 05/03/2022   HGB 15.9 05/03/2022   HCT 46.2 05/03/2022   MCV 97.1 05/03/2022   PLT 177.0 05/03/2022      Component Value Date/Time   NA 138 05/03/2022 0924   NA 142 08/03/2021 0953   K 5.1 05/03/2022 0924   CL 100 05/03/2022 0924   CO2 31 05/03/2022 0924   GLUCOSE 115 (H) 05/03/2022 0924   BUN 18 05/03/2022 0924   BUN 13 08/03/2021 0953   CREATININE 1.15 05/03/2022 0924   CREATININE 1.04 10/25/2016 1547   CALCIUM 9.8 05/03/2022 0924   PROT 6.5 05/03/2022 0924   PROT 6.5 09/01/2020 0925   ALBUMIN 4.3 05/03/2022 0924   ALBUMIN 4.6 09/01/2020 0925   AST 25 05/03/2022 0924   ALT 27 05/03/2022 0924   ALKPHOS 59 05/03/2022 0924   BILITOT 0.9 05/03/2022 0924   BILITOT 0.6 09/01/2020 0925   GFRNONAA >60 01/06/2021 0110   GFRAA 77 09/01/2020 0925   Lab Results  Component Value Date   CHOL 149 05/03/2022   HDL 43.50 05/03/2022   LDLCALC 78 05/03/2022   LDLDIRECT 92.0 10/09/2019   TRIG 135.0 05/03/2022   CHOLHDL 3 05/03/2022   Lab Results  Component Value Date   HGBA1C 5.5 05/04/2022   Lab Results  Component Value Date   VITAMINB12 347 05/30/2018   Lab Results  Component Value Date   TSH 3.93 05/03/2022     ASSESSMENT AND PLAN 72 y.o. year old male  has a past medical history of Aortic stenosis, Ascending aortic aneurysm ([place]) (09/18/2018), Colon cancer ([place]), Family history of adverse reaction to anesthesia, GERD (gastroesophageal reflux disease), Headache, Heart murmur, Hyperlipidemia (10/25/2016), Hypertension, Melanoma ([place]), OSA on CPAP (09/18/2018), PAF (paroxysmal atrial fibrillation) ([place]) (06/13/2017), Perforated sigmoid colon ([place]), PVC's (premature ventricular contractions) (10/25/2016), Tinnitus, and Wears glasses. here with   No diagnosis found.    [name] Brown is doing well on CPAP therapy. Compliance  report reveals excellent compliance. He was encouraged to continue using CPAP nightly and for greater than 4 hours each night. We will update supply orders as indicated. Risks of untreated sleep apnea review and education materials provided. He will continue ropinirole 0.88m at bedtime. May take 0.29mat dinner and 0.68m20mt bedtime if needed. Healthy lifestyle habits encouraged. He will follow up in 1 year. He verbalizes understanding and agreement with this plan.    No orders of the defined types were placed in this encounter.    No orders of the defined types were placed in this encounter.     AmyDebbora PrestoNP-C 10/31/2022, 11:14 AM [facility] [address] [phone]

## 2022-10-31 NOTE — Patient Instructions (Signed)
Please continue using your CPAP regularly. While your insurance requires that you use CPAP at least 4 hours each night on 70% of the nights, I recommend, that you not skip any nights and use it throughout the night if you can. Getting used to CPAP and staying with the treatment long term does take time and patience and discipline. Untreated obstructive sleep apnea when it is moderate to severe can have an adverse impact on cardiovascular health and raise her risk for heart disease, arrhythmias, hypertension, congestive heart failure, stroke and diabetes. Untreated obstructive sleep apnea causes sleep disruption, nonrestorative sleep, and sleep deprivation. This can have an impact on your day to day functioning and cause daytime sleepiness and impairment of cognitive function, memory loss, mood disturbance, and problems focussing. Using CPAP regularly can improve these symptoms.  Continue ropinirole 0.20m daily.   Follow up in 1 year

## 2022-11-01 ENCOUNTER — Encounter: Payer: Self-pay | Admitting: Family Medicine

## 2022-11-01 ENCOUNTER — Other Ambulatory Visit: Payer: Self-pay | Admitting: Cardiovascular Disease

## 2022-11-01 ENCOUNTER — Ambulatory Visit (INDEPENDENT_AMBULATORY_CARE_PROVIDER_SITE_OTHER): Payer: Medicare Other | Admitting: Family Medicine

## 2022-11-01 VITALS — BP 131/82 | HR 76 | Ht 66.0 in | Wt 180.2 lb

## 2022-11-01 DIAGNOSIS — G2581 Restless legs syndrome: Secondary | ICD-10-CM

## 2022-11-01 DIAGNOSIS — G4733 Obstructive sleep apnea (adult) (pediatric): Secondary | ICD-10-CM | POA: Diagnosis not present

## 2022-11-01 MED ORDER — ROPINIROLE HCL 0.25 MG PO TABS: 0.7500 mg | ORAL_TABLET | Freq: Every day | ORAL | 3 refills | Status: DC

## 2022-11-02 NOTE — Telephone Encounter (Signed)
Rx request sent to pharmacy.  

## 2022-11-03 ENCOUNTER — Encounter: Payer: Self-pay | Admitting: Family Medicine

## 2022-11-03 ENCOUNTER — Ambulatory Visit (INDEPENDENT_AMBULATORY_CARE_PROVIDER_SITE_OTHER): Payer: Medicare Other | Admitting: Family Medicine

## 2022-11-03 VITALS — BP 122/76 | HR 72 | Temp 97.2°F | Resp 16 | Ht 66.0 in | Wt 182.5 lb

## 2022-11-03 DIAGNOSIS — E7849 Other hyperlipidemia: Secondary | ICD-10-CM

## 2022-11-03 DIAGNOSIS — I10 Essential (primary) hypertension: Secondary | ICD-10-CM | POA: Diagnosis not present

## 2022-11-03 DIAGNOSIS — F341 Dysthymic disorder: Secondary | ICD-10-CM | POA: Insufficient documentation

## 2022-11-03 DIAGNOSIS — E039 Hypothyroidism, unspecified: Secondary | ICD-10-CM | POA: Diagnosis not present

## 2022-11-03 LAB — LIPID PANEL
Cholesterol: 145 mg/dL (ref 0–200)
HDL: 52.5 mg/dL (ref >39.00)
LDL Cholesterol: 68 mg/dL (ref 0–99)
NonHDL: 92.32
Total CHOL/HDL Ratio: 3
Triglycerides: 122 mg/dL (ref 0.0–149.0)
VLDL: 24.4 mg/dL (ref 0.0–40.0)

## 2022-11-03 LAB — COMPREHENSIVE METABOLIC PANEL
ALT: 30 U/L (ref 0–53)
AST: 24 U/L (ref 0–37)
Albumin: 4.4 g/dL (ref 3.5–5.2)
Alkaline Phosphatase: 60 U/L (ref 39–117)
BUN: 16 mg/dL (ref 6–23)
CO2: 30 mEq/L (ref 19–32)
Calcium: 9.7 mg/dL (ref 8.4–10.5)
Chloride: 102 mEq/L (ref 96–112)
Creatinine, Ser: 1.26 mg/dL (ref 0.40–1.50)
GFR: 57.11 mL/min — ABNORMAL LOW (ref >60.00)
Glucose, Bld: 97 mg/dL (ref 70–99)
Potassium: 4.7 mEq/L (ref 3.5–5.1)
Sodium: 139 mEq/L (ref 135–145)
Total Bilirubin: 0.9 mg/dL (ref 0.2–1.2)
Total Protein: 6.4 g/dL (ref 6.0–8.3)

## 2022-11-03 LAB — CBC WITH DIFFERENTIAL/PLATELET
Basophils Absolute: 0 10*3/uL (ref 0.0–0.1)
Basophils Relative: 0.8 % (ref 0.0–3.0)
Eosinophils Absolute: 0.1 10*3/uL (ref 0.0–0.7)
Eosinophils Relative: 2.2 % (ref 0.0–5.0)
HCT: 47.3 % (ref 39.0–52.0)
Hemoglobin: 16.4 g/dL (ref 13.0–17.0)
Lymphocytes Relative: 17.1 % (ref 12.0–46.0)
Lymphs Abs: 0.8 10*3/uL (ref 0.7–4.0)
MCHC: 34.6 g/dL (ref 30.0–36.0)
MCV: 95.5 fl (ref 78.0–100.0)
Monocytes Absolute: 0.5 10*3/uL (ref 0.1–1.0)
Monocytes Relative: 10.1 % (ref 3.0–12.0)
Neutro Abs: 3.3 10*3/uL (ref 1.4–7.7)
Neutrophils Relative %: 69.8 % (ref 43.0–77.0)
Platelets: 170 10*3/uL (ref 150.0–400.0)
RBC: 4.95 Mil/uL (ref 4.22–5.81)
RDW: 13.2 % (ref 11.5–15.5)
WBC: 4.8 10*3/uL (ref 4.0–10.5)

## 2022-11-03 LAB — TSH: TSH: 2.85 u[IU]/mL (ref 0.35–5.50)

## 2022-11-03 MED ORDER — LEVOTHYROXINE SODIUM 50 MCG PO TABS: 50.0000 ug | ORAL_TABLET | Freq: Every day | ORAL | 1 refills | Status: DC

## 2022-11-03 NOTE — Assessment & Plan Note (Signed)
Chronic problem.  Pt admits to increased fatigue, decreased mood and low motivation.  He is aware that this can be depression related but wonders if his thyroid medication needs to be adjusted.  Check labs.  Adjust meds prn

## 2022-11-03 NOTE — Patient Instructions (Signed)
Follow up in 6 months to recheck blood pressure, cholesterol, and thyroid We'll notify you of your lab results and make any changes if needed Continue to work on healthy diet and regular exercise- you can do it! Call with any questions or concerns Stay Safe!  Stay Healthy!

## 2022-11-03 NOTE — Assessment & Plan Note (Signed)
Chronic problem.  Currently on Pravastatin 102m daily and Zetia 160mdaily w/o difficulty.  Check labs.  Adjust meds prn

## 2022-11-03 NOTE — Assessment & Plan Note (Signed)
New.  Pt reports sxs started ~1 yr ago.  Describes it as 'feeling blue'.  Admits to decreased energy and motivation.  Is not interested in mood medication at this time but would like to assess if thyroid meds need to be adjusted.  Encouraged pt to reach out if sxs fail to improve or worsen.

## 2022-11-03 NOTE — Assessment & Plan Note (Signed)
Pt's PE WNL.  Excellent control on Dilt 143m daily, Losartan 1079mdaily.  Currently asymptomatic.  Check labs due to ARB use but no anticipated med changes.

## 2022-11-03 NOTE — Progress Notes (Signed)
   Subjective:    Patient ID: [name] Brown, male    DOB: 1951/09/04, 72 y.o.   MRN: [id number]  HPI HTN- chronic problem, on Dilt 124m daily, Losartan 103mdaily.  No CP, SOB, HA's, visual changes, edema.  Hypothyroid- chronic problem, on Levothyroxine 5014mdaily.  Pt reports decreased energy, mood, motivation.  Particularly notable in the gym.    Hyperlipidemia- chronic problem, on Pravastatin 78m51mily and Zetia 10mg64mly.  No abd pain, N/V.  'more blue feelings than normal'- pt reports this has been going on for the past year.  Doesn't feel overwhelmed.  Nothing specific going on but decreased motivation.  Pt is not interested in medication.   Review of Systems For ROS see HPI     Objective:   Physical Exam Vitals reviewed.  Constitutional:      General: He is not in acute distress.    Appearance: Normal appearance. He is well-developed. He is not ill-appearing.  HENT:     Head: Normocephalic and atraumatic.  Eyes:     Extraocular Movements: Extraocular movements intact.     Conjunctiva/sclera: Conjunctivae normal.     Pupils: Pupils are equal, round, and reactive to light.  Neck:     Thyroid: No thyromegaly.  Cardiovascular:     Rate and Rhythm: Normal rate and regular rhythm.     Pulses: Normal pulses.     Heart sounds: Normal heart sounds. No murmur heard. Pulmonary:     Effort: Pulmonary effort is normal. No respiratory distress.     Breath sounds: Normal breath sounds.  Abdominal:     General: Bowel sounds are normal. There is no distension.     Palpations: Abdomen is soft.  Musculoskeletal:     Cervical back: Normal range of motion and neck supple.     Right lower leg: No edema.     Left lower leg: No edema.  Lymphadenopathy:     Cervical: No cervical adenopathy.  Skin:    General: Skin is warm and dry.  Neurological:     General: No focal deficit present.     Mental Status: He is alert and oriented to person, place, and time.     Cranial Nerves: No  cranial nerve deficit.  Psychiatric:        Mood and Affect: Mood normal.        Behavior: Behavior normal.          Assessment & Plan:

## 2022-11-04 ENCOUNTER — Telehealth: Payer: Self-pay

## 2022-11-04 ENCOUNTER — Other Ambulatory Visit: Payer: Self-pay

## 2022-11-04 DIAGNOSIS — E039 Hypothyroidism, unspecified: Secondary | ICD-10-CM

## 2022-11-04 MED ORDER — LEVOTHYROXINE SODIUM 75 MCG PO TABS: 75.0000 ug | ORAL_TABLET | Freq: Every day | ORAL | 3 refills | Status: DC

## 2022-11-04 NOTE — Telephone Encounter (Signed)
Informed pt of lab results . Repeat lab TSH has been placed and levothyroxine 75 mcg has been sent to pharmacy .

## 2022-11-04 NOTE — Telephone Encounter (Signed)
-----   Message from [name], MD sent at 11/04/2022  7:35 AM EST ----- Labs look great!  TSH is back to the middle of the range but since you are having symptoms, we will increase your Levothyroxine to 57mg daily (#30, 3 refills) and repeat your TSH level at a lab only visit in 1 month (dx hypothyroid)

## 2022-12-02 ENCOUNTER — Encounter: Payer: Self-pay | Admitting: Family Medicine

## 2022-12-02 ENCOUNTER — Other Ambulatory Visit: Payer: Self-pay

## 2022-12-02 ENCOUNTER — Other Ambulatory Visit (INDEPENDENT_AMBULATORY_CARE_PROVIDER_SITE_OTHER): Payer: Medicare Other

## 2022-12-02 DIAGNOSIS — E039 Hypothyroidism, unspecified: Secondary | ICD-10-CM

## 2022-12-02 LAB — TSH: TSH: 2.69 u[IU]/mL (ref 0.35–5.50)

## 2022-12-02 MED ORDER — LEVOTHYROXINE SODIUM 88 MCG PO TABS: 88.0000 ug | ORAL_TABLET | Freq: Every day | ORAL | 3 refills | Status: DC

## 2022-12-11 NOTE — Progress Notes (Signed)
Cardiology Office Note:    Date:  12/12/2022   ID:  [name], DOB 12-24-1950, MRN [id number]  PCP:  [name], MD   [facility] Cardiologist:  Skeet Latch, MD     Referring MD: [name], MD   CC: Follow up after recent ETT  History of Present Illness:    [name] Brown is a 72 y.o. male with a hx of nonobstructive CAD on LHC in 2018, HTN, aortic stenosis with bicuspid valve s/p AVR 2018, PVCs, NICM, HFrEF w/ recovered EF, OSA on CPAP, hypothyroidism, colon cancer, hyperlipidemia, aortic dilatation, LBBB.  He established care with Dr. Oval Linsey in 2018 at the behest of his PCP after a recent abnormal echocardiogram.  Coronary atherosclerosis was noted on a recent chest x-ray, echo at that time revealed an EF of 30 to 35% with grade 1 DD, his aortic valve was heavily calcified and bicuspid.  On 11/03/2016 he underwent a left heart cath  on 11/02/2022 he presented which showed mild to moderate nonobstructive CAD, EF 45 to 50%, NICM, with recommendations for medical management.  In May 2018 repeat echo showed an EF of 30 to 35%, moderate diffuse hypokinesis, severely thickened calcified leaflets of the aortic valve.  On 02/20/2017 he underwent a cardiac MRI which showed LV was moderately dilated with paradoxic septal motion, the aortic valve was bicuspid with moderate stenosis, no evidence of scar/infarct.  On 02/22/2017 he underwent echo dobutamine stress test which indicated his valve was likely severely stenotic, and he was referred to [facility].  He subsequently underwent AVR on 06/08/2017 with a 25mm Edwards Magna-Ease pericardial valve, hospitalization was complicated by development of atrial fibrillation.  Most recently he was evaluated by Dr. Oval Linsey on 10/13/2022, at this time he reported decrease in his stamina most notably during his workouts.  His metoprolol was discontinued and diltiazem was started.  Exercise stress test was ordered and revealed no  ischemia, normal exam.  He presents today for follow up after recent exercise stress, which was overall normal. He reports he is doing well overall. His PCP recently adjusted his levothyroxine. He is exercising 4 days/week ~ 40 minutes each time. Compliant with his CPAP. He does endorse that he "cut back" on his exercise routine over the last year, and has had ~ 8 lbs weight gain. He denies chest pain, palpitations, dyspnea, pnd, orthopnea, n, v, dizziness, syncope, edema, weight gain, or early satiety.   Past Medical History:  Diagnosis Date   Aortic stenosis    Ascending aortic aneurysm 09/18/2018   Colon cancer    Family history of adverse reaction to anesthesia    pt states his mother had 2 episodes of hyponatremia following anesthesia    GERD (gastroesophageal reflux disease)    Headache    Heart murmur    Hyperlipidemia 10/25/2016   Hypertension    pt is currently not taking any medications; pt states is borderline   Melanoma    OSA on CPAP 09/18/2018   PAF (paroxysmal atrial fibrillation) 06/13/2017   Perforated sigmoid colon    PVC's (premature ventricular contractions) 10/25/2016   Tinnitus    Wears glasses     Past Surgical History:  Procedure Laterality Date   AORTIC VALVE REPLACEMENT N/A 06/08/2017   Procedure: AORTIC VALVE REPLACEMENT (AVR);  Surgeon: [name], MD;  Location: [facility];  Service: Open Heart Surgery;  Laterality: N/A;  Using 66mm Perimount Magna Ease Pericardial Bioprosthesis Aortic Valve   COLOSTOMY CLOSURE N/A 08/18/2016  Procedure: COLOSTOMY CLOSURE OPEN PROCEDURE;  Surgeon: Armandina Gemma, MD;  Location: [facility];  Service: General;  Laterality: N/A;   LAPAROTOMY N/A 04/25/2016   Procedure: EXPLORATORY LAPAROTOMY WITH SIGMOID COLECTOMY, COLOSTOMY;  Surgeon: Armandina Gemma, MD;  Location: [facility];  Service: General;  Laterality: N/A;   LEFT HEART CATH AND CORONARY ANGIOGRAPHY N/A 11/03/2016   Procedure: Left Heart Cath and Coronary Angiography;  Surgeon:  Burnell Blanks, MD;  Location: [facility];  Service: Cardiovascular;  Laterality: N/A;   MELANOMA EXCISION     PARTIAL COLECTOMY Right 08/18/2016   Procedure: RIGHT COLECTOMY;  Surgeon: Armandina Gemma, MD;  Location: [facility];  Service: General;  Laterality: Right;   TEE WITHOUT CARDIOVERSION N/A 06/08/2017   Procedure: TRANSESOPHAGEAL ECHOCARDIOGRAM (TEE);  Surgeon: [name], MD;  Location: [place];  Service: Open Heart Surgery;  Laterality: N/A;    Current Medications: Current Meds  Medication Sig   aspirin EC 81 MG tablet Take 81 mg by mouth daily.   diltiazem (CARDIZEM CD) 120 MG 24 hr capsule Take 1 capsule (120 mg total) by mouth daily.   ezetimibe (ZETIA) 10 MG tablet Take 1 tablet (10 mg total) by mouth daily. Please keep your upcoming appointment for refills.   famotidine (PEPCID) 20 MG tablet Take 20 mg by mouth daily as needed for heartburn or indigestion.   levothyroxine (SYNTHROID) 88 MCG tablet Take 1 tablet (88 mcg total) by mouth daily.   losartan (COZAAR) 100 MG tablet TAKE 1 TABLET BY MOUTH DAILY   pravastatin (PRAVACHOL) 20 MG tablet Take 1 tablet (20 mg total) by mouth daily.   rOPINIRole (REQUIP) 0.25 MG tablet Take 3 tablets (0.75 mg total) by mouth at bedtime.   tadalafil (CIALIS) 20 MG tablet Take 20 mg by mouth daily as needed for erectile dysfunction.   Varenicline Tartrate (TYRVAYA) 0.03 MG/ACT SOLN Place into the nose.     Allergies:   Patient has no known allergies.   Social History   Socioeconomic History   Marital status: Married    Spouse name: Not on file   Number of children: Not on file   Years of education: Not on file   Highest education level: Not on file  Occupational History   Occupation: Retired Software engineer  Tobacco Use   Smoking status: Never   Smokeless tobacco: Never  Vaping Use   Vaping Use: Never used  Substance and Sexual Activity   Alcohol use: Yes    Alcohol/week: 21.0 - 24.0 standard drinks of alcohol    Types:  21 - 24 Standard drinks or equivalent per week    Comment: 2-3 glasses of wine daily    Drug use: No   Sexual activity: Not on file  Other Topics Concern   Not on file  Social History Narrative   Pt lives in [place] with his wife, they are active.      Pt lives at home with his wife.      1 Cat    Social Determinants of Health   Financial Resource Strain: Low Risk  (01/06/2022)   Overall Financial Resource Strain (CARDIA)    Difficulty of Paying Living Expenses: Not hard at all  Food Insecurity: No Food Insecurity (01/06/2022)   Hunger Vital Sign    Worried About Running Out of Food in the Last Year: Never true    Ran Out of Food in the Last Year: Never true  Transportation Needs: No Transportation Needs (01/06/2022)   PRAPARE -  Hydrologist (Medical): No    Lack of Transportation (Non-Medical): No  Physical Activity: Insufficiently Active (01/06/2022)   Exercise Vital Sign    Days of Exercise per Week: 4 days    Minutes of Exercise per Session: 30 min  Stress: No Stress Concern Present (01/06/2022)   [place]    Feeling of Stress : Not at all  Social Connections: Moderately Isolated (01/06/2022)   Social Connection and Isolation Panel [NHANES]    Frequency of Communication with Friends and Family: Three times a week    Frequency of Social Gatherings with Friends and Family: Three times a week    Attends Religious Services: Never    Active Member of Clubs or Organizations: No    Attends Music therapist: Never    Marital Status: Married     Family History: The patient's family history includes Alcohol abuse in his father; CAD in his mother; Cirrhosis in his father; Dementia in his mother; Heart attack in his father; Kidney disease in his mother; Stroke in his maternal grandmother and paternal grandmother; Vascular Disease in his mother.  ROS:   Please see the  history of present illness.    All other systems reviewed and are negative.  EKGs/Labs/Other Studies Reviewed:    The following studies were reviewed today:  10/20/2022 myocardial perfusion imaging -risk study, no ischemia.  08/10/2021 echo complete-EF 55 to 60%, grade 1 DD, trivial MR, aortic dilatation noted 39 mm.  06/06/2017 carotid ultrasound-no evidence of stenosis  02/22/2017 echo dobutamine stress test -aortic valve was likely severely stenotic  11/03/16 LHC:   Mid RCA to Dist RCA lesion, 10 %stenosed. RPDA lesion, 10 %stenosed. Ost Cx to Prox Cx lesion, 40 %stenosed. Ost Ramus to Ramus lesion, 20 %stenosed. Mid LAD lesion, 20 %stenosed. Prox LAD lesion, 30 %stenosed. The left ventricular ejection fraction is 45-50% by visual estimate. There is mild left ventricular systolic dysfunction. LV end diastolic pressure is normal. There is no mitral valve regurgitation.  EKG:  EKG is not ordered today  Recent Labs: 11/03/2022: ALT 30; BUN 16; Creatinine, Ser 1.26; Hemoglobin 16.4; Platelets 170.0; Potassium 4.7; Sodium 139 12/02/2022: TSH 2.69  Recent Lipid Panel    Component Value Date/Time   CHOL 145 11/03/2022 0942   CHOL 166 09/01/2020 0925   TRIG 122.0 11/03/2022 0942   HDL 52.50 11/03/2022 0942   HDL 58 09/01/2020 0925   CHOLHDL 3 11/03/2022 0942   VLDL 24.4 11/03/2022 0942   LDLCALC 68 11/03/2022 0942   LDLCALC 92 09/01/2020 0925   LDLDIRECT 92.0 10/09/2019 1117     Risk Assessment/Calculations:                Physical Exam:    VS:  BP 124/88   Pulse 70   Ht 5\' 6"  (1.676 m)   Wt 184 lb (83.5 kg)   SpO2 96%   BMI 29.70 kg/m     Wt Readings from Last 3 Encounters:  12/12/22 184 lb (83.5 kg)  11/03/22 182 lb 8 oz (82.8 kg)  11/01/22 180 lb 3.2 oz (81.7 kg)     GEN:  Well nourished, well developed in no acute distress HEENT: Normal NECK: No JVD; No carotid bruits LYMPHATICS: No lymphadenopathy CARDIAC: RRR, artificial valve closure noted, rubs,  gallops RESPIRATORY:  Clear to auscultation without rales, wheezing or rhonchi  ABDOMEN: Soft, non-tender, non-distended MUSCULOSKELETAL:  No edema; No deformity  SKIN: Warm  and dry NEUROLOGIC:  Alert and oriented x 3 PSYCHIATRIC:  Normal affect   ASSESSMENT:    1. DOE (dyspnea on exertion)   2. Essential hypertension   3. S/P AVR (aortic valve replacement)   4. Other hyperlipidemia   5. PVC's (premature ventricular contractions)   6. PAF (paroxysmal atrial fibrillation)   7. Coronary artery disease involving native coronary artery of native heart without angina pectoris   8. HFrEF (heart failure with reduced ejection fraction)    PLAN:    In order of problems listed above:  DOE/fatigue -recent stress test was unrevealing for contributing causes. He reports this is no worse/no better. May be contributed to deconditioning as he admittedly cut back on his exercise regimen leading up to this year.  PCP recently increased his levothyroxine dose.  Continue with exercise regimen.  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 No indication for ischemic evaluation.  Continue aspirin, Zetia, Pravachol. HFrEF -echo in November 2022 showed recovered EF of 55 to 60%, grade 1 DD. NYHA class 1, euvolemic.  Hypertension -blood pressure today is well-controlled at 124/88, continue diltiazem, losartan. S/p AVR - on echo in November 2022, valve placed appropriately, no leaking noted.  PVCs -quiescent.  Continue diltiazem. PAF -isolated event s/p AVR replacement. HLD -LDL on 11/03/2022 was well-managed at 68, continue Zetia and pravastatin.  Disposition-follow-up with Dr. Oval Linsey in 6 months.           Medication Adjustments/Labs and Tests Ordered: Current medicines are reviewed at length with the patient today.  Concerns regarding medicines are outlined above.  No orders of the defined types were placed in this encounter.  No orders of the  defined types were placed in this encounter.   Patient Instructions  Medication Instructions:  Your physician recommends that you continue on your current medications as directed. Please refer to the Current Medication list given to you today.  *If you need a refill on your cardiac medications before your next appointment, please call your pharmacy*  Follow-Up: At [facility], you and your health needs are our priority.  As part of our continuing mission to provide you with exceptional heart care, we have created designated Provider Care Teams.  These Care Teams include your primary Cardiologist (physician) and Advanced Practice Providers (APPs -  Physician Assistants and Nurse Practitioners) who all work together to provide you with the care you need, when you need it.  We recommend signing up for the patient portal called "MyChart".  Sign up information is provided on this After Visit Summary.  MyChart is used to connect with patients for Virtual Visits (Telemedicine).  Patients are able to view lab/test results, encounter notes, upcoming appointments, etc.  Non-urgent messages can be sent to your provider as well.   To learn more about what you can do with MyChart, go to [email or web address].    Your next appointment:   6-8 month(s)  Provider:   Skeet Latch, MD     Signed, Trudi Ida, NP  12/12/2022 10:39 AM    [place]

## 2022-12-12 ENCOUNTER — Other Ambulatory Visit (HOSPITAL_BASED_OUTPATIENT_CLINIC_OR_DEPARTMENT_OTHER): Payer: Self-pay

## 2022-12-12 ENCOUNTER — Ambulatory Visit (INDEPENDENT_AMBULATORY_CARE_PROVIDER_SITE_OTHER): Payer: Medicare Other | Admitting: Cardiology

## 2022-12-12 ENCOUNTER — Encounter (HOSPITAL_BASED_OUTPATIENT_CLINIC_OR_DEPARTMENT_OTHER): Payer: Self-pay | Admitting: Cardiology

## 2022-12-12 VITALS — BP 124/88 | HR 70 | Ht 66.0 in | Wt 184.0 lb

## 2022-12-12 DIAGNOSIS — I493 Ventricular premature depolarization: Secondary | ICD-10-CM | POA: Diagnosis not present

## 2022-12-12 DIAGNOSIS — I480 Paroxysmal atrial fibrillation: Secondary | ICD-10-CM | POA: Diagnosis not present

## 2022-12-12 DIAGNOSIS — I10 Essential (primary) hypertension: Secondary | ICD-10-CM | POA: Diagnosis not present

## 2022-12-12 DIAGNOSIS — Z952 Presence of prosthetic heart valve: Secondary | ICD-10-CM | POA: Diagnosis not present

## 2022-12-12 DIAGNOSIS — R0609 Other forms of dyspnea: Secondary | ICD-10-CM | POA: Diagnosis not present

## 2022-12-12 DIAGNOSIS — I2510 Atherosclerotic heart disease of native coronary artery without angina pectoris: Secondary | ICD-10-CM

## 2022-12-12 DIAGNOSIS — I5020 Unspecified systolic (congestive) heart failure: Secondary | ICD-10-CM

## 2022-12-12 DIAGNOSIS — E7849 Other hyperlipidemia: Secondary | ICD-10-CM | POA: Diagnosis not present

## 2022-12-12 NOTE — Patient Instructions (Signed)
Medication Instructions:  Your physician recommends that you continue on your current medications as directed. Please refer to the Current Medication list given to you today.  *If you need a refill on your cardiac medications before your next appointment, please call your pharmacy*  Follow-Up: At [facility], you and your health needs are our priority.  As part of our continuing mission to provide you with exceptional heart care, we have created designated Provider Care Teams.  These Care Teams include your primary Cardiologist (physician) and Advanced Practice Providers (APPs -  Physician Assistants and Nurse Practitioners) who all work together to provide you with the care you need, when you need it.  We recommend signing up for the patient portal called "MyChart".  Sign up information is provided on this After Visit Summary.  MyChart is used to connect with patients for Virtual Visits (Telemedicine).  Patients are able to view lab/test results, encounter notes, upcoming appointments, etc.  Non-urgent messages can be sent to your provider as well.   To learn more about what you can do with MyChart, go to [email or web address].    Your next appointment:   6-8 month(s)  Provider:   Skeet Latch, MD

## 2022-12-26 ENCOUNTER — Other Ambulatory Visit: Payer: Self-pay

## 2022-12-26 DIAGNOSIS — E039 Hypothyroidism, unspecified: Secondary | ICD-10-CM

## 2023-01-02 ENCOUNTER — Other Ambulatory Visit (INDEPENDENT_AMBULATORY_CARE_PROVIDER_SITE_OTHER): Payer: Medicare Other

## 2023-01-02 ENCOUNTER — Telehealth: Payer: Self-pay

## 2023-01-02 DIAGNOSIS — E039 Hypothyroidism, unspecified: Secondary | ICD-10-CM

## 2023-01-02 LAB — TSH: TSH: 2.58 u[IU]/mL (ref 0.35–5.50)

## 2023-01-02 NOTE — Telephone Encounter (Signed)
-----   Message from Sheliah Hatch, MD sent at 01/02/2023  4:06 PM EDT ----- TSH remains normal.  How are you feeling?

## 2023-01-02 NOTE — Telephone Encounter (Signed)
Left lab results on pt VM 

## 2023-01-03 ENCOUNTER — Other Ambulatory Visit (HOSPITAL_BASED_OUTPATIENT_CLINIC_OR_DEPARTMENT_OTHER): Payer: Self-pay | Admitting: Cardiovascular Disease

## 2023-01-03 ENCOUNTER — Telehealth: Payer: Self-pay | Admitting: Family Medicine

## 2023-01-03 DIAGNOSIS — H04123 Dry eye syndrome of bilateral lacrimal glands: Secondary | ICD-10-CM | POA: Diagnosis not present

## 2023-01-03 MED ORDER — LEVOTHYROXINE SODIUM 100 MCG PO TABS: 100.0000 ug | ORAL_TABLET | Freq: Every day | ORAL | 3 refills | Status: DC

## 2023-01-03 NOTE — Telephone Encounter (Signed)
Spoke to pt and made a lab only visit

## 2023-01-03 NOTE — Addendum Note (Signed)
Addended by: Sheliah Hatch on: 01/03/2023 08:14 AM   Modules accepted: Orders

## 2023-01-03 NOTE — Telephone Encounter (Signed)
Rx request sent to pharmacy.  

## 2023-01-03 NOTE — Telephone Encounter (Signed)
Called patient to schedule Medicare Annual Wellness Visit (AWV). Left message for patient to call back and schedule Medicare Annual Wellness Visit (AWV).  Last date of AWV: 01/06/2022   Please schedule an AWVS appointment at any time with [facility] SV ANNUAL WELLNESS VISIT.  If any questions, please contact me at [phone].    Thank you,  [facility] Support [facility] Direct dial  [phone]

## 2023-01-11 ENCOUNTER — Telehealth: Payer: Self-pay | Admitting: Family Medicine

## 2023-01-11 NOTE — Telephone Encounter (Signed)
Called patient to schedule Medicare Annual Wellness Visit (AWV). Left message for patient to call back and schedule Medicare Annual Wellness Visit (AWV).  Last date of AWV: 01/06/2022   Please schedule an AWVS appointment at any time with [facility] ANNUAL WELLNESS VISIT.  If any questions, please contact me at [phone].    Thank you,  Dung Prien  [facility] Support [facility] Direct dial  [phone]   

## 2023-01-25 ENCOUNTER — Telehealth: Payer: Self-pay | Admitting: Family Medicine

## 2023-01-25 NOTE — Telephone Encounter (Signed)
Contacted [name] Brown to schedule their annual wellness visit. Patient declined to schedule AWV at this time.  Patient said AWV is unnecessary and is worried about the cost to Medicare.  Thank you,  [facility] Support [facility] Direct dial  [phone]

## 2023-01-27 NOTE — Telephone Encounter (Signed)
Patient was seen by Garner Gavel NP 4/1

## 2023-02-02 ENCOUNTER — Other Ambulatory Visit: Payer: Self-pay

## 2023-02-02 ENCOUNTER — Other Ambulatory Visit (INDEPENDENT_AMBULATORY_CARE_PROVIDER_SITE_OTHER): Payer: Medicare Other

## 2023-02-02 ENCOUNTER — Telehealth: Payer: Self-pay

## 2023-02-02 DIAGNOSIS — E039 Hypothyroidism, unspecified: Secondary | ICD-10-CM | POA: Diagnosis not present

## 2023-02-02 LAB — TSH: TSH: 1.56 u[IU]/mL (ref 0.35–5.50)

## 2023-02-02 MED ORDER — LEVOTHYROXINE SODIUM 100 MCG PO TABS: 100.0000 ug | ORAL_TABLET | Freq: Every day | ORAL | 3 refills | Status: DC

## 2023-02-02 NOTE — Telephone Encounter (Signed)
Pt aware of lab results 

## 2023-02-02 NOTE — Telephone Encounter (Signed)
-----   Message from Sheliah Hatch, MD sent at 02/02/2023  3:37 PM EDT ----- TSH looks great!  No changes at this time

## 2023-02-04 ENCOUNTER — Encounter: Payer: Self-pay | Admitting: Family Medicine

## 2023-02-07 ENCOUNTER — Other Ambulatory Visit: Payer: Self-pay

## 2023-02-07 DIAGNOSIS — E039 Hypothyroidism, unspecified: Secondary | ICD-10-CM

## 2023-02-07 MED ORDER — LEVOTHYROXINE SODIUM 100 MCG PO TABS
100.0000 ug | ORAL_TABLET | Freq: Every day | ORAL | 1 refills | Status: DC
Start: 2023-02-07 — End: 2023-12-07

## 2023-04-12 ENCOUNTER — Encounter (INDEPENDENT_AMBULATORY_CARE_PROVIDER_SITE_OTHER): Payer: Self-pay

## 2023-05-01 ENCOUNTER — Ambulatory Visit (INDEPENDENT_AMBULATORY_CARE_PROVIDER_SITE_OTHER): Payer: Medicare Other | Admitting: Family Medicine

## 2023-05-01 ENCOUNTER — Encounter: Payer: Self-pay | Admitting: Family Medicine

## 2023-05-01 VITALS — BP 130/78 | HR 73 | Temp 97.8°F | Resp 18 | Ht 66.0 in | Wt 181.4 lb

## 2023-05-01 DIAGNOSIS — E7849 Other hyperlipidemia: Secondary | ICD-10-CM | POA: Diagnosis not present

## 2023-05-01 DIAGNOSIS — E039 Hypothyroidism, unspecified: Secondary | ICD-10-CM

## 2023-05-01 DIAGNOSIS — I10 Essential (primary) hypertension: Secondary | ICD-10-CM

## 2023-05-01 NOTE — Progress Notes (Signed)
   Subjective:    Patient ID: [name] Brown, male    DOB: [birth year], 72 y.o.   MRN: [id number]  HPI HTN- chronic problem, on Dilt 120mg  daily, Losartan 100mg  daily w/ adequate control.  No CP, SOB, HA's, visual changes, edema.  Hyperlipidemia- chronic problem, on Zetia 10mg  daily and Pravastatin 20mg  daily.  No abd pain, N/V.  Hypothyroid- chronic problem, on Levothyroxine daily.  Pt feels dose is appropriate.  No excessive fatigue.  No changes to skin/hair/nails   Review of Systems For ROS see HPI     Objective:   Physical Exam Vitals reviewed.  Constitutional:      General: He is not in acute distress.    Appearance: Normal appearance. He is well-developed. He is not ill-appearing.  HENT:     Head: Normocephalic and atraumatic.  Eyes:     Extraocular Movements: Extraocular movements intact.     Conjunctiva/sclera: Conjunctivae normal.     Pupils: Pupils are equal, round, and reactive to light.  Neck:     Thyroid: No thyromegaly.  Cardiovascular:     Rate and Rhythm: Normal rate and regular rhythm.     Pulses: Normal pulses.     Heart sounds: Murmur (I/VI SEM) heard.  Pulmonary:     Effort: Pulmonary effort is normal. No respiratory distress.     Breath sounds: Normal breath sounds.  Abdominal:     General: Bowel sounds are normal. There is no distension.     Palpations: Abdomen is soft.  Musculoskeletal:     Cervical back: Normal range of motion and neck supple.     Right lower leg: No edema.     Left lower leg: No edema.  Lymphadenopathy:     Cervical: No cervical adenopathy.  Skin:    General: Skin is warm and dry.  Neurological:     General: No focal deficit present.     Mental Status: He is alert and oriented to person, place, and time.     Cranial Nerves: No cranial nerve deficit.  Psychiatric:        Mood and Affect: Mood normal.        Behavior: Behavior normal.           Assessment & Plan:

## 2023-05-01 NOTE — Assessment & Plan Note (Signed)
Chronic problem.  Currently on Zetia 10mg  daily and Pravastatin 20mg  daily w/o difficulty.  Check labs.  Adjust meds prn

## 2023-05-01 NOTE — Assessment & Plan Note (Signed)
Chronic problem.  Currently on Diltiazem 120mg  daily and Losartan 100mg  daily w/ adequate control.  Currently asymptomatic.  Check labs due to ARB use but no anticipated med changes.  Will follow.

## 2023-05-01 NOTE — Assessment & Plan Note (Signed)
Chronic problem.  On Levothyroxine daily and currently asymptomatic.  Check labs.  Adjust meds prn

## 2023-05-01 NOTE — Patient Instructions (Signed)
Follow up in 6 months to recheck blood pressure and cholesterol We'll notify you of your lab results and make any changes if needed Continue to work on healthy diet and regular exercise- you can do it! Call with any questions or concerns Stay Safe!  Stay Healthy! Enjoy the beach!!!

## 2023-05-02 ENCOUNTER — Other Ambulatory Visit (INDEPENDENT_AMBULATORY_CARE_PROVIDER_SITE_OTHER): Payer: Medicare Other

## 2023-05-02 DIAGNOSIS — E039 Hypothyroidism, unspecified: Secondary | ICD-10-CM

## 2023-05-02 DIAGNOSIS — E7849 Other hyperlipidemia: Secondary | ICD-10-CM | POA: Diagnosis not present

## 2023-05-02 DIAGNOSIS — I10 Essential (primary) hypertension: Secondary | ICD-10-CM | POA: Diagnosis not present

## 2023-05-02 LAB — BASIC METABOLIC PANEL
BUN: 20 mg/dL (ref 6–23)
CO2: 26 mEq/L (ref 19–32)
Calcium: 9.7 mg/dL (ref 8.4–10.5)
Chloride: 105 mEq/L (ref 96–112)
Creatinine, Ser: 0.97 mg/dL (ref 0.40–1.50)
GFR: 77.89 mL/min (ref >60.00)
Glucose, Bld: 174 mg/dL — ABNORMAL HIGH (ref 70–99)
Potassium: 4 mEq/L (ref 3.5–5.1)
Sodium: 137 mEq/L (ref 135–145)

## 2023-05-02 LAB — LIPID PANEL
Cholesterol: 166 mg/dL (ref 0–200)
HDL: 44.4 mg/dL (ref >39.00)
NonHDL: 121.81
Total CHOL/HDL Ratio: 4
Triglycerides: 313 mg/dL — ABNORMAL HIGH (ref 0.0–149.0)
VLDL: 62.6 mg/dL — ABNORMAL HIGH (ref 0.0–40.0)

## 2023-05-02 LAB — CBC WITH DIFFERENTIAL/PLATELET
Basophils Absolute: 0 10*3/uL (ref 0.0–0.1)
Basophils Relative: 0.6 % (ref 0.0–3.0)
Eosinophils Absolute: 0.1 10*3/uL (ref 0.0–0.7)
Eosinophils Relative: 2.3 % (ref 0.0–5.0)
HCT: 48.5 % (ref 39.0–52.0)
Hemoglobin: 16.3 g/dL (ref 13.0–17.0)
Lymphocytes Relative: 18.4 % (ref 12.0–46.0)
Lymphs Abs: 1 10*3/uL (ref 0.7–4.0)
MCHC: 33.6 g/dL (ref 30.0–36.0)
MCV: 96.7 fl (ref 78.0–100.0)
Monocytes Absolute: 0.4 10*3/uL (ref 0.1–1.0)
Monocytes Relative: 7.3 % (ref 3.0–12.0)
Neutro Abs: 3.9 10*3/uL (ref 1.4–7.7)
Neutrophils Relative %: 71.4 % (ref 43.0–77.0)
Platelets: 196 10*3/uL (ref 150.0–400.0)
RBC: 5.01 Mil/uL (ref 4.22–5.81)
RDW: 13.1 % (ref 11.5–15.5)
WBC: 5.5 10*3/uL (ref 4.0–10.5)

## 2023-05-02 LAB — HEPATIC FUNCTION PANEL
ALT: 37 U/L (ref 0–53)
AST: 26 U/L (ref 0–37)
Albumin: 4.4 g/dL (ref 3.5–5.2)
Alkaline Phosphatase: 61 U/L (ref 39–117)
Bilirubin, Direct: 0.1 mg/dL (ref 0.0–0.3)
Total Bilirubin: 0.8 mg/dL (ref 0.2–1.2)
Total Protein: 6.7 g/dL (ref 6.0–8.3)

## 2023-05-02 LAB — LDL CHOLESTEROL, DIRECT: Direct LDL: 105 mg/dL

## 2023-05-02 LAB — TSH: TSH: 1.44 u[IU]/mL (ref 0.35–5.50)

## 2023-05-03 ENCOUNTER — Other Ambulatory Visit (INDEPENDENT_AMBULATORY_CARE_PROVIDER_SITE_OTHER): Payer: Medicare Other

## 2023-05-03 ENCOUNTER — Telehealth: Payer: Self-pay

## 2023-05-03 ENCOUNTER — Other Ambulatory Visit: Payer: Self-pay

## 2023-05-03 DIAGNOSIS — R7309 Other abnormal glucose: Secondary | ICD-10-CM

## 2023-05-03 LAB — HEMOGLOBIN A1C: Hgb A1c MFr Bld: 5.5 % (ref 4.6–6.5)

## 2023-05-03 NOTE — Telephone Encounter (Signed)
-----   Message from Neena Rhymes sent at 05/03/2023  1:41 PM EDT ----- No evidence of diabetes- this is great news!

## 2023-05-03 NOTE — Telephone Encounter (Signed)
Pt is aware of the lab results the Add on A1C has been faxed to lab

## 2023-05-03 NOTE — Telephone Encounter (Signed)
Pt seen results Via my chart  

## 2023-05-03 NOTE — Telephone Encounter (Signed)
-----   Message from Neena Rhymes sent at 05/03/2023  7:43 AM EDT ----- Sugar is elevated at 174.  We will add an A1C to assess for possible diabetes  Triglycerides (fatty part of blood) are quite high.  This is not typical for you.  Please make sure you are eating a healthy diet and getting regular exercise.  No med changes at this time

## 2023-05-09 ENCOUNTER — Encounter: Payer: Self-pay | Admitting: Family Medicine

## 2023-05-09 DIAGNOSIS — H9190 Unspecified hearing loss, unspecified ear: Secondary | ICD-10-CM

## 2023-06-01 DIAGNOSIS — H90A32 Mixed conductive and sensorineural hearing loss, unilateral, left ear with restricted hearing on the contralateral side: Secondary | ICD-10-CM | POA: Diagnosis not present

## 2023-06-01 DIAGNOSIS — H9313 Tinnitus, bilateral: Secondary | ICD-10-CM | POA: Diagnosis not present

## 2023-06-01 DIAGNOSIS — H90A21 Sensorineural hearing loss, unilateral, right ear, with restricted hearing on the contralateral side: Secondary | ICD-10-CM | POA: Diagnosis not present

## 2023-06-12 DIAGNOSIS — H04123 Dry eye syndrome of bilateral lacrimal glands: Secondary | ICD-10-CM | POA: Diagnosis not present

## 2023-06-27 DIAGNOSIS — Z23 Encounter for immunization: Secondary | ICD-10-CM | POA: Diagnosis not present

## 2023-07-05 ENCOUNTER — Other Ambulatory Visit (HOSPITAL_BASED_OUTPATIENT_CLINIC_OR_DEPARTMENT_OTHER): Payer: Self-pay | Admitting: Cardiovascular Disease

## 2023-07-13 ENCOUNTER — Encounter (HOSPITAL_BASED_OUTPATIENT_CLINIC_OR_DEPARTMENT_OTHER): Payer: Self-pay | Admitting: Cardiovascular Disease

## 2023-07-13 ENCOUNTER — Ambulatory Visit (HOSPITAL_BASED_OUTPATIENT_CLINIC_OR_DEPARTMENT_OTHER): Payer: Medicare Other | Admitting: Cardiovascular Disease

## 2023-07-13 VITALS — BP 122/72 | HR 65 | Ht 66.0 in | Wt 184.0 lb

## 2023-07-13 DIAGNOSIS — Q2381 Bicuspid aortic valve: Secondary | ICD-10-CM

## 2023-07-13 DIAGNOSIS — I428 Other cardiomyopathies: Secondary | ICD-10-CM

## 2023-07-13 DIAGNOSIS — G4733 Obstructive sleep apnea (adult) (pediatric): Secondary | ICD-10-CM | POA: Diagnosis not present

## 2023-07-13 DIAGNOSIS — I480 Paroxysmal atrial fibrillation: Secondary | ICD-10-CM

## 2023-07-13 DIAGNOSIS — Z5181 Encounter for therapeutic drug level monitoring: Secondary | ICD-10-CM | POA: Diagnosis not present

## 2023-07-13 DIAGNOSIS — I493 Ventricular premature depolarization: Secondary | ICD-10-CM | POA: Diagnosis not present

## 2023-07-13 DIAGNOSIS — Z952 Presence of prosthetic heart valve: Secondary | ICD-10-CM | POA: Diagnosis not present

## 2023-07-13 DIAGNOSIS — I7121 Aneurysm of the ascending aorta, without rupture: Secondary | ICD-10-CM | POA: Diagnosis not present

## 2023-07-13 DIAGNOSIS — E7849 Other hyperlipidemia: Secondary | ICD-10-CM | POA: Diagnosis not present

## 2023-07-13 DIAGNOSIS — Q230 Congenital stenosis of aortic valve: Secondary | ICD-10-CM

## 2023-07-13 DIAGNOSIS — I10 Essential (primary) hypertension: Secondary | ICD-10-CM | POA: Diagnosis not present

## 2023-07-13 NOTE — Progress Notes (Signed)
Cardiology Office Note:  .    Date:  07/17/2023  ID:  [name] Brown, DOB 09/28/1950, MRN [id number] PCP: Sheliah Hatch, MD  [facility] Providers Cardiologist:  Chilton Si, MD     History of Present Illness: .    [name] Brown is a 72 y.o. male with non-obstructive CAD, chronic systolic and diastolic heart failure (LVEF 30-35% improved to 60-65%%), aortic stenosis s/p bioprosthetic aortic valve,  and colon cancer s/p R colectomy who presents for follow up.  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  [name] Brown saw [name] Brown on 09/23/16 and was noted to have coronary atherosclerosis on chest xray.  He had an echo 10/12/16 that revealed LVEF 30-35% with grade 1 diastolic dysfunction.  His aortic valve was heavily calcified and likely bicuspid.  Although the mean gradient was 14 mmHg, it was felt that he likely had moderate aortic stenosis given his reduced systolic function and leaflet restriction.   He was started on aspirin and referred to cardiology for evaluation.  He was referred for cardiac catheterization 10/2016 that revealed mild-mod CAD and LVEF 45-50%. [name] Brown was referred for a repeat echo 01/17/17 that revealed LVEF 30-35% with grade 1 diastolic dysfunction. There was fusion of the right and noncoronary cusps and leaflet mobility was severely restricted. The mean gradient was 16 mmHg.    He had a dobutamine stress echo where his gradient increased to 38 mmHg with dobutamine infusion.  He underwent AVR with a 25mm Edwards Magna-Ease pericardial valve 06/08/17.  His hospitalization was complicated by atrial fibrillation and he was started on amiodarone which was discontinued 3 months post-op.   [name] Brown had a repeat echocardiogram 03/2019 that revealed LVEF 60 to 65%.  The mean gradient across his aortic valve was 13 mmHg.  There has been concern by echo for an ascending aortic aneurysm up to 4.3 cm.  He was referred  for CTA that revealed his ascending aorta was 3.6 cm.  [name] Brown was treated for colon cancer with surgery only and did not require chemotherapy.  He was admitted to the hospital 12/2020 with a small bowel obstruction.  He improved with NG tube placement and conservative management.  Blood pressure was slightly above goal and losartan and metoprolol were both increased.  He had a repeat echo 07/2021 that revealed LVEF 55 to 60% with grade 1 diastolic dysfunction.  Aortic valve mean gradient was 15 mmHg.  Ascending aorta was 3.9 cm.    At his visit 10/2022, he reported lower stamina noticeable during his workouts. He also noted increased fatigue in the prior 6 months, with heightened tendency to take naps in the afternoon. His home blood pressures had some variability such as 118/77, 129/72, and 120/77. He has a documented history of palpitations, which occurred when his metoprolol dosage was decreased the previous year. Additionally, he has a known allergy to shellfish and requires pre-medications for specific medical procedures. We discontinued his metoprolol and initiated diltiazem 120 mg daily. He had an exercise Myoview 10/20/2022 which was low risk and showing no evidence of ischemia.  Today, he notes that his energy levels are still a little low, it has been difficult to stay motivated. When exercising he usually starts with the elliptical, and sometimes doesn't have the energy to move on to the treadmill. He only feels fatigue, no associated shortness of breath or chest pain. He states that at his last PCP visit in  August, his triglycerides were elevated to 313. He affirms that his diet has remained consistent but he has not been exercising as frequently with his fatigue as above. In the office, his blood pressure is 135/80 initially, and 122/72 on manual recheck. His home blood pressure was 114/67 this morning, but typically in the 120s/70s. Additionally he has noticed very slightly increased edema in his  hands, as it is sometimes more difficult to remove the ring from his finger. Typically he is able to sleep about 7-8 hours, although he tosses and turns at least once a night. He believes he may be feeling a little anxious or depressed in the last 3-4 months regarding his age and health issues. 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 He denies any palpitations, lightheadedness, headaches, syncope, orthopnea, or PND.  ROS:  Please see the history of present illness. All other systems are reviewed and negative.  (+) Fatigue (+) Intermittent edema of hands  Studies Reviewed: [name]    Exercise Myoview  10/20/2022:   Findings are consistent with no ischemia. The study is low risk.   No ST deviation was noted.   LV perfusion is normal.   Left ventricular function is abnormal. Global function is mildly reduced. Nuclear stress EF: 51 %. The left ventricular ejection fraction is mildly decreased (45-54%). End diastolic cavity size is normal. End systolic cavity size is normal.   Prior study not available for comparison.   Normal perfusion. A wide-complex rhythm was noted during exercise which is condordant and appears to be exercise-induced LBBB that was transient. Overall low risk stress test. LVEF 51% with incoordinate septal motion. No prior for comparison.  Risk Assessment/Calculations:      Physical Exam:    VS:  BP 122/72 (BP Location: Right Arm, Patient Position: Sitting)   Pulse 65   Ht 5\' 6"  (1.676 m)   Wt 184 lb (83.5 kg)   SpO2 97%   BMI 29.70 kg/m  , BMI Body mass index is 29.7 kg/m. GENERAL:  Well appearing HEENT: Pupils equal round and reactive, fundi not visualized, oral mucosa unremarkable NECK:  No jugular venous distention, waveform within normal limits, carotid upstroke brisk and symmetric, no bruits, no thyromegaly LUNGS:  Clear to auscultation bilaterally HEART:  RRR.  PMI not displaced or sustained,S1 and S2 within normal limits, no S3, no S4, no clicks, no rubs, no  murmurs ABD:  Flat, positive bowel sounds normal in frequency in pitch, no bruits, no rebound, no guarding, no midline pulsatile mass, no hepatomegaly, no splenomegaly EXT:  2 plus pulses throughout, no edema, no cyanosis no clubbing SKIN:  No rashes no nodules NEURO:  Cranial nerves II through XII grossly intact, motor grossly intact throughout PSYCH:  Cognitively intact, oriented to person place and time  Wt Readings from Last 3 Encounters:  07/13/23 184 lb (83.5 kg)  05/01/23 181 lb 6 oz (82.3 kg)  12/12/22 184 lb (83.5 kg)     ASSESSMENT AND PLAN: .    # Hyperlipidemia Elevated triglycerides (313) and blood glucose (174) in August. Patient reports decreased exercise and stable diet. -Order comprehensive metabolic panel and lipid panel today. -Continue current regimen of Zetia and Pravastatin until results are reviewed.  # Depression Patient reports low energy, decreased motivation, and feelings of unhappiness related to aging and vision loss post cataract surgery. -Discussed potential benefit of starting an SSRI such as Lexapro, but patient prefers to wait and consider further. -Recommended discussing feelings of depression with primary care provider,  Dr. Evonnie Dawes.  # Coronary Artery Disease Mild disease noted on previous catheterization. Patient is currently on low dose aspirin without any bleeding issues. -Continue low dose aspirin given presence of coronary disease. -Order echocardiogram to ensure stability of cardiac function.   # s/p bioprosthetic AVR:  He has been stable and has no heart failure symptoms.  He is due for follow up echo.   # General Health Maintenance -Continue current regimen of Diltiazem for heart rate and blood pressure control in the context of atrial fibrillation. -Plan for annual follow-up unless changes are needed based on lab results.       Dispo:  FU with [name] Brown C. Duke Salvia, MD, [facility] in 1 year.  I,Mathew Stumpf,acting as a Neurosurgeon for Chilton Si, MD.,have documented all relevant documentation on the behalf of Chilton Si, MD,as directed by  Chilton Si, MD while in the presence of Chilton Si, MD.  I, Gaia Gullikson C. Duke Salvia, MD have reviewed all documentation for this visit.  The documentation of the exam, diagnosis, procedures, and orders on 07/17/2023 are all accurate and complete.   Signed, Chilton Si, MD

## 2023-07-13 NOTE — Patient Instructions (Signed)
Medication Instructions:  Your physician recommends that you continue on your current medications as directed. Please refer to the Current Medication list given to you today.   *If you need a refill on your cardiac medications before your next appointment, please call your pharmacy*  Lab Work: LP/CMET TODAY   If you have labs (blood work) drawn today and your tests are completely normal, you will receive your results only by: MyChart Message (if you have MyChart) OR A paper copy in the mail If you have any lab test that is abnormal or we need to change your treatment, we will call you to review the results.  Testing/Procedures: Your physician has requested that you have an echocardiogram. Echocardiography is a painless test that uses sound waves to create images of your heart. It provides your doctor with information about the size and shape of your heart and how well your heart's chambers and valves are working. This procedure takes approximately one hour. There are no restrictions for this procedure. Please do NOT wear cologne, perfume, aftershave, or lotions (deodorant is allowed). Please arrive 15 minutes prior to your appointment time.   Follow-Up: At [facility], you and your health needs are our priority.  As part of our continuing mission to provide you with exceptional heart care, we have created designated Provider Care Teams.  These Care Teams include your primary Cardiologist (physician) and Advanced Practice Providers (APPs -  Physician Assistants and Nurse Practitioners) who all work together to provide you with the care you need, when you need it.  We recommend signing up for the patient portal called "MyChart".  Sign up information is provided on this After Visit Summary.  MyChart is used to connect with patients for Virtual Visits (Telemedicine).  Patients are able to view lab/test results, encounter notes, upcoming appointments, etc.  Non-urgent messages can be sent  to your provider as well.   To learn more about what you can do with MyChart, go to [email or web address].    Your next appointment:   12 month(s)  Provider:   Chilton Si, MD or Gillian Shields, NP

## 2023-07-14 LAB — LIPID PANEL
Chol/HDL Ratio: 3 ratio (ref 0.0–5.0)
Cholesterol, Total: 155 mg/dL (ref 100–199)
HDL: 52 mg/dL (ref >39)
LDL Chol Calc (NIH): 80 mg/dL (ref 0–99)
Triglycerides: 132 mg/dL (ref 0–149)
VLDL Cholesterol Cal: 23 mg/dL (ref 5–40)

## 2023-07-14 LAB — COMPREHENSIVE METABOLIC PANEL
ALT: 42 [IU]/L (ref 0–44)
AST: 28 [IU]/L (ref 0–40)
Albumin: 4.4 g/dL (ref 3.8–4.8)
Alkaline Phosphatase: 83 [IU]/L (ref 44–121)
BUN/Creatinine Ratio: 9 — ABNORMAL LOW (ref 10–24)
BUN: 12 mg/dL (ref 8–27)
Bilirubin Total: 0.8 mg/dL (ref 0.0–1.2)
CO2: 23 mmol/L (ref 20–29)
Calcium: 10 mg/dL (ref 8.6–10.2)
Chloride: 100 mmol/L (ref 96–106)
Creatinine, Ser: 1.27 mg/dL (ref 0.76–1.27)
Globulin, Total: 2.4 g/dL (ref 1.5–4.5)
Glucose: 94 mg/dL (ref 70–99)
Potassium: 4.6 mmol/L (ref 3.5–5.2)
Sodium: 138 mmol/L (ref 134–144)
Total Protein: 6.8 g/dL (ref 6.0–8.5)
eGFR: 60 mL/min/{1.73_m2} (ref >59)

## 2023-07-17 ENCOUNTER — Encounter (HOSPITAL_BASED_OUTPATIENT_CLINIC_OR_DEPARTMENT_OTHER): Payer: Self-pay | Admitting: Cardiovascular Disease

## 2023-07-17 ENCOUNTER — Ambulatory Visit (HOSPITAL_BASED_OUTPATIENT_CLINIC_OR_DEPARTMENT_OTHER): Payer: Medicare Other

## 2023-07-17 DIAGNOSIS — Z952 Presence of prosthetic heart valve: Secondary | ICD-10-CM | POA: Diagnosis not present

## 2023-07-17 LAB — ECHOCARDIOGRAM COMPLETE
AR max vel: 0.62 cm2
AV Area VTI: 0.59 cm2
AV Area mean vel: 0.58 cm2
AV Mean grad: 13 mm[Hg]
AV Peak grad: 24 mm[Hg]
Ao pk vel: 2.45 m/s
Area-P 1/2: 3.48 cm2
MV M vel: 3.98 m/s
MV Peak grad: 63.4 mm[Hg]
S' Lateral: 3.51 cm

## 2023-07-19 ENCOUNTER — Encounter (HOSPITAL_BASED_OUTPATIENT_CLINIC_OR_DEPARTMENT_OTHER): Payer: Self-pay | Admitting: Cardiovascular Disease

## 2023-07-19 DIAGNOSIS — I7121 Aneurysm of the ascending aorta, without rupture: Secondary | ICD-10-CM

## 2023-08-22 ENCOUNTER — Ambulatory Visit (HOSPITAL_BASED_OUTPATIENT_CLINIC_OR_DEPARTMENT_OTHER)
Admission: RE | Admit: 2023-08-22 | Discharge: 2023-08-22 | Disposition: A | Discharge status: Home / Self Care | Payer: Medicare Other | Source: Ambulatory Visit | Attending: Family | Admitting: Family

## 2023-08-22 DIAGNOSIS — I2510 Atherosclerotic heart disease of native coronary artery without angina pectoris: Secondary | ICD-10-CM | POA: Diagnosis not present

## 2023-08-22 DIAGNOSIS — I7121 Aneurysm of the ascending aorta, without rupture: Secondary | ICD-10-CM | POA: Diagnosis not present

## 2023-08-22 DIAGNOSIS — I712 Thoracic aortic aneurysm, without rupture, unspecified: Secondary | ICD-10-CM | POA: Diagnosis not present

## 2023-08-22 DIAGNOSIS — J929 Pleural plaque without asbestos: Secondary | ICD-10-CM | POA: Diagnosis not present

## 2023-08-22 LAB — POCT I-STAT CREATININE: Creatinine, Ser: 1.3 mg/dL — ABNORMAL HIGH (ref 0.61–1.24)

## 2023-08-22 MED ORDER — IOHEXOL 350 MG/ML SOLN
100.0000 mL | Freq: Once | INTRAVENOUS | Status: AC | PRN
  Administered 2023-08-22: 75 mL via INTRAVENOUS

## 2023-08-28 ENCOUNTER — Telehealth (HOSPITAL_BASED_OUTPATIENT_CLINIC_OR_DEPARTMENT_OTHER): Payer: Self-pay

## 2023-08-28 DIAGNOSIS — I7121 Aneurysm of the ascending aorta, without rupture: Secondary | ICD-10-CM

## 2023-08-28 NOTE — Telephone Encounter (Signed)
-----   Message from Alver Sorrow sent at 08/25/2023 10:41 AM EST ----- CT aorta with ascending aorta 40mm (was 38mm by prior CT). This is much better than the measurement on echocardiogram and more accurate. Recommend repeat CT aorta in 1 year for monitoring. Prevent from worsening by keeping BP controlled to goal <130/80.

## 2023-08-28 NOTE — Telephone Encounter (Signed)
Results seen by patient; repeat CT aorta placed to be done within 1 year.  ----- Message from Alver Sorrow sent at 08/25/2023 10:41 AM EST ----- CT aorta with ascending aorta 40mm (was 38mm by prior CT). This is much better than the measurement on echocardiogram and more accurate. Recommend repeat CT aorta in 1 year for monitoring. Prevent from worsening by keeping BP controlled to goal <130/80.

## 2023-09-12 DIAGNOSIS — H59031 Cystoid macular edema following cataract surgery, right eye: Secondary | ICD-10-CM | POA: Diagnosis not present

## 2023-09-12 DIAGNOSIS — H04123 Dry eye syndrome of bilateral lacrimal glands: Secondary | ICD-10-CM | POA: Diagnosis not present

## 2023-09-21 DIAGNOSIS — L821 Other seborrheic keratosis: Secondary | ICD-10-CM | POA: Diagnosis not present

## 2023-09-21 DIAGNOSIS — D2261 Melanocytic nevi of right upper limb, including shoulder: Secondary | ICD-10-CM | POA: Diagnosis not present

## 2023-09-21 DIAGNOSIS — L812 Freckles: Secondary | ICD-10-CM | POA: Diagnosis not present

## 2023-09-21 DIAGNOSIS — D225 Melanocytic nevi of trunk: Secondary | ICD-10-CM | POA: Diagnosis not present

## 2023-09-21 DIAGNOSIS — B360 Pityriasis versicolor: Secondary | ICD-10-CM | POA: Diagnosis not present

## 2023-09-21 DIAGNOSIS — Z85820 Personal history of malignant melanoma of skin: Secondary | ICD-10-CM | POA: Diagnosis not present

## 2023-09-21 DIAGNOSIS — D2272 Melanocytic nevi of left lower limb, including hip: Secondary | ICD-10-CM | POA: Diagnosis not present

## 2023-09-21 DIAGNOSIS — D2262 Melanocytic nevi of left upper limb, including shoulder: Secondary | ICD-10-CM | POA: Diagnosis not present

## 2023-10-09 ENCOUNTER — Other Ambulatory Visit (HOSPITAL_BASED_OUTPATIENT_CLINIC_OR_DEPARTMENT_OTHER): Payer: Self-pay | Admitting: Cardiovascular Disease

## 2023-11-02 ENCOUNTER — Ambulatory Visit (INDEPENDENT_AMBULATORY_CARE_PROVIDER_SITE_OTHER): Payer: Medicare Other | Admitting: Family Medicine

## 2023-11-02 ENCOUNTER — Encounter: Payer: Self-pay | Admitting: Family Medicine

## 2023-11-02 VITALS — BP 110/70 | HR 72 | Temp 97.8°F | Ht 66.0 in | Wt 192.5 lb

## 2023-11-02 DIAGNOSIS — E7849 Other hyperlipidemia: Secondary | ICD-10-CM

## 2023-11-02 DIAGNOSIS — E669 Obesity, unspecified: Secondary | ICD-10-CM | POA: Diagnosis not present

## 2023-11-02 DIAGNOSIS — Z1159 Encounter for screening for other viral diseases: Secondary | ICD-10-CM | POA: Diagnosis not present

## 2023-11-02 DIAGNOSIS — F341 Dysthymic disorder: Secondary | ICD-10-CM | POA: Diagnosis not present

## 2023-11-02 DIAGNOSIS — E039 Hypothyroidism, unspecified: Secondary | ICD-10-CM

## 2023-11-02 DIAGNOSIS — I10 Essential (primary) hypertension: Secondary | ICD-10-CM | POA: Diagnosis not present

## 2023-11-02 LAB — CBC WITH DIFFERENTIAL/PLATELET
Basophils Absolute: 0 10*3/uL (ref 0.0–0.1)
Basophils Relative: 0.5 % (ref 0.0–3.0)
Eosinophils Absolute: 0.1 10*3/uL (ref 0.0–0.7)
Eosinophils Relative: 2 % (ref 0.0–5.0)
HCT: 50.2 % (ref 39.0–52.0)
Hemoglobin: 17.1 g/dL — ABNORMAL HIGH (ref 13.0–17.0)
Lymphocytes Relative: 19.4 % (ref 12.0–46.0)
Lymphs Abs: 1.1 10*3/uL (ref 0.7–4.0)
MCHC: 34 g/dL (ref 30.0–36.0)
MCV: 98.2 fL (ref 78.0–100.0)
Monocytes Absolute: 0.7 10*3/uL (ref 0.1–1.0)
Monocytes Relative: 11.6 % (ref 3.0–12.0)
Neutro Abs: 3.9 10*3/uL (ref 1.4–7.7)
Neutrophils Relative %: 66.5 % (ref 43.0–77.0)
Platelets: 165 10*3/uL (ref 150.0–400.0)
RBC: 5.11 Mil/uL (ref 4.22–5.81)
RDW: 13.4 % (ref 11.5–15.5)
WBC: 5.9 10*3/uL (ref 4.0–10.5)

## 2023-11-02 LAB — HEPATIC FUNCTION PANEL
ALT: 44 U/L (ref 0–53)
AST: 30 U/L (ref 0–37)
Albumin: 4.3 g/dL (ref 3.5–5.2)
Alkaline Phosphatase: 62 U/L (ref 39–117)
Bilirubin, Direct: 0.1 mg/dL (ref 0.0–0.3)
Total Bilirubin: 0.8 mg/dL (ref 0.2–1.2)
Total Protein: 6.8 g/dL (ref 6.0–8.3)

## 2023-11-02 LAB — BASIC METABOLIC PANEL
BUN: 14 mg/dL (ref 6–23)
CO2: 29 meq/L (ref 19–32)
Calcium: 9.6 mg/dL (ref 8.4–10.5)
Chloride: 102 meq/L (ref 96–112)
Creatinine, Ser: 1.15 mg/dL (ref 0.40–1.50)
GFR: 63.28 mL/min (ref >60.00)
Glucose, Bld: 84 mg/dL (ref 70–99)
Potassium: 5.1 meq/L (ref 3.5–5.1)
Sodium: 139 meq/L (ref 135–145)

## 2023-11-02 LAB — LIPID PANEL
Cholesterol: 148 mg/dL (ref 0–200)
HDL: 45.1 mg/dL (ref >39.00)
LDL Cholesterol: 46 mg/dL (ref 0–99)
NonHDL: 103.22
Total CHOL/HDL Ratio: 3
Triglycerides: 284 mg/dL — ABNORMAL HIGH (ref 0.0–149.0)
VLDL: 56.8 mg/dL — ABNORMAL HIGH (ref 0.0–40.0)

## 2023-11-02 LAB — TSH: TSH: 3.43 u[IU]/mL (ref 0.35–5.50)

## 2023-11-02 MED ORDER — FLUOXETINE HCL 10 MG PO CAPS: 10.0000 mg | ORAL_CAPSULE | Freq: Every day | ORAL | 3 refills | Status: DC

## 2023-11-02 NOTE — Assessment & Plan Note (Signed)
Chronic problem.  On Levothyroxine daily.  + fatigue.  Check labs.  Adjust meds prn

## 2023-11-02 NOTE — Assessment & Plan Note (Signed)
Deteriorated.  Pt reports sxs x6 months or more.  He has less motivation, has been feeling down.  Stressed about global events.  Agreeable to start medication- will start Fluoxetine 10mg  daily and monitor for improvement.  Pt expressed understanding and is in agreement w/ plan.

## 2023-11-02 NOTE — Progress Notes (Signed)
   Subjective:    Patient ID: [name] Brown, male    DOB: May 03, 1951, 73 y.o.   MRN: [id number]  HPI HTN- chronic problem, on Dilt 120mg  daily, Losartan 100mg  daily w/ good control.  No CP, SOB, HA's, edema.  Hyperlipidemia- chronic problem, on Pravastatin 20mg  daily and Zetia 10mg  daily.  Denies abd pain, N/V.  Hypothyroid- chronic problem, on Levothyroxine daily.  + fatigue.  Obesity- pt has gained 8 lbs since October.  Exercising 1-2 days/week.  Dysthymia- pt reports feeling 'a little down lately'.  Sxs started at least 6 months ago.  Reports less motivation.  He's been particularly down about his eyes/vision after failed cataract surgery.     Review of Systems For ROS see HPI     Objective:   Physical Exam Vitals reviewed.  Constitutional:      General: He is not in acute distress.    Appearance: Normal appearance. He is well-developed. He is not ill-appearing.  HENT:     Head: Normocephalic and atraumatic.  Eyes:     Extraocular Movements: Extraocular movements intact.     Conjunctiva/sclera: Conjunctivae normal.     Pupils: Pupils are equal, round, and reactive to light.  Neck:     Thyroid: No thyromegaly.  Cardiovascular:     Rate and Rhythm: Normal rate and regular rhythm.     Pulses: Normal pulses.     Heart sounds: Murmur (II/VI) heard.  Pulmonary:     Effort: Pulmonary effort is normal. No respiratory distress.     Breath sounds: Normal breath sounds.  Abdominal:     General: Bowel sounds are normal. There is no distension.     Palpations: Abdomen is soft.  Musculoskeletal:     Cervical back: Normal range of motion and neck supple.     Right lower leg: No edema.     Left lower leg: No edema.  Lymphadenopathy:     Cervical: No cervical adenopathy.  Skin:    General: Skin is warm and dry.  Neurological:     General: No focal deficit present.     Mental Status: He is alert and oriented to person, place, and time.     Cranial Nerves: No cranial nerve  deficit.  Psychiatric:        Mood and Affect: Mood normal.        Behavior: Behavior normal.           Assessment & Plan:

## 2023-11-02 NOTE — Patient Instructions (Incomplete)
Continue ropinirole 0.75mg  daily.   Please continue using your CPAP regularly. While your insurance requires that you use CPAP at least 4 hours each night on 70% of the nights, I recommend, that you not skip any nights and use it throughout the night if you can. Getting used to CPAP and staying with the treatment long term does take time and patience and discipline. Untreated obstructive sleep apnea when it is moderate to severe can have an adverse impact on cardiovascular health and raise her risk for heart disease, arrhythmias, hypertension, congestive heart failure, stroke and diabetes. Untreated obstructive sleep apnea causes sleep disruption, nonrestorative sleep, and sleep deprivation. This can have an impact on your day to day functioning and cause daytime sleepiness and impairment of cognitive function, memory loss, mood disturbance, and problems focussing. Using CPAP regularly can improve these symptoms.  We will update supply orders, today. You are eligible for a new machine. I will order a repeat sleep study that you will do at home. Please listen out for a call from our sleep lab staff to schedule. Once you complete study, our sleep doctors with evaluate the data and we will use that to order a new machine as indicated. This process could take a few weeks. Once new machine is ordered, you will hear back from your DME company to schedule set up of your new machine.   We will need to see you back within 31-90 days following set up of your new CPAP to document compliance as required by your insurance company. Please call the office to schedule follow up when you receive your new machine. Please feel free to reach out with any questions or concerns.

## 2023-11-02 NOTE — Progress Notes (Deleted)
 PATIENT: [name] Brown DOB: Jan 31, 1951  REASON FOR VISIT: follow up HISTORY FROM: patient  No chief complaint on file.    HISTORY OF PRESENT ILLNESS:  11/02/23 ALL:  [name] Brown returns for follow up for OSA on CPAP and RLS. He continues ropinirole 0.75mg  daily. RLS is well managed.   He continues CPAP therapy nightly for about   Set up end of 2019.   11/01/2022 ALL:  [name] Brown returns for follow up for OSA on CPAP and RLS. He continues ropinirole 0.75mg  daily. He reports doing well. No significant changes. He has continued taking all three tablets of ropinirole at the same time. He does not feel symptoms are bad enough to adjust dosing. He continues CPAP nightly. No concerns with machine or supplies. He is sleeping well. He is having more fatigue. He is going to the gym but not sure he is making any progress. He feels less motivated. He is on levothyroxine and reports TSH has steadily increased over past few evaluations. He is followed closely by PCP.     10/28/2021 ALL: [name] Brown is a 73 y.o. male here today for follow up for OSA on CPAP and RLS. He continues ropinirole  0.75mg  at bedtime. He feels this works well to manage symptoms at night. He does have some discomfort in the evenings before bedtime. He is tolerating meds well with no adverse effects. He continues consistent use of CPAP. He is using a F20 FFM. It does push against his lower eyes but has tried multiple other masks that were not a good fit for him. He is followed by ophthalmology. He is using Restasis and eye drops for dry eyes.     HISTORY: (copied from Dr Teofilo Pod previous note)  [name] Brown as a 73 year old right-handed gentleman with an underlying medical history of aortic stenosis with heart murmur, hypertension, hyperlipidemia, history of melanoma, history of paroxysmal A. fib, reflux disease, history of perforated sigmoid colon, history of PVCs, tinnitus, and mildly overweight state, who presents for follow-up  consultation of his RLS and OSA, on treatment with CPAP therapy. The patient is unaccompanied today and presents for his yearly check up.  I last saw him on 09/18/2019, at which time he was fully compliant with his CPAP.  He was able to tolerate Requip.  Requip was helpful for his restless leg symptoms.  He was doing well with regards to his sleep apnea treatment with CPAP.  He was advised to follow-up routinely in 1 year.  He was advised to continue with ropinirole 0.75 mg each night.   Today, 09/22/2020: I reviewed his CPAP compliance data from the past 30 days from 08/22/2020 through 09/20/2020, during which time he used his machine every night with percent use days greater than 4 hours at 100%, indicating superb compliance with an average usage of 8 hours and 3 minutes, residual AHI at goal at 2.1/h, leak on the low side with a 95th percentile at 1.2 L/min on a pressure of 9 cm without EPR.  He reports doing well with his CPAP.  He is using a small mask, the medium was too large for him.  He is very consistent with the usage and does not mind using CPAP long-term.  Ropinirole continues to help with his leg twitching and he takes it every night around 9 PM, he is in bed generally between 1030 and 11 PM.  He has noticed no side effects with the ropinirole, in particular, no significant swelling in the lower extremities, no  sleepiness, no abnormal behaviors noted.  He sees his cardiologist twice a year and primary care also twice a year.  He had an increase in his pravastatin.   REVIEW OF SYSTEMS: Out of a complete 14 system review of symptoms, the patient complains only of the following symptoms, RLS, fatigue and all other reviewed systems are negative.  ESS: 7 FSS: 37  ALLERGIES: No Known Allergies  HOME MEDICATIONS: Outpatient Medications Prior to Visit  Medication Sig Dispense Refill   aspirin EC 81 MG tablet Take 81 mg by mouth daily.     diltiazem (CARDIZEM CD) 120 MG 24 hr capsule TAKE 1 CAPSULE  BY MOUTH DAILY 90 capsule 2   doxycycline (VIBRAMYCIN) 50 MG capsule daily.     ezetimibe (ZETIA) 10 MG tablet TAKE 1 TABLET BY MOUTH DAILY 90 tablet 1   famotidine (PEPCID) 20 MG tablet Take 20 mg by mouth daily as needed for heartburn or indigestion.     FLUoxetine (PROZAC) 10 MG capsule Take 1 capsule (10 mg total) by mouth daily. 30 capsule 3   ketoconazole (NIZORAL) 2 % cream      levothyroxine (SYNTHROID) 100 MCG tablet Take 1 tablet (100 mcg total) by mouth daily. 90 tablet 1   losartan (COZAAR) 100 MG tablet TAKE 1 TABLET BY MOUTH DAILY 90 tablet 3   pravastatin (PRAVACHOL) 20 MG tablet Take 1 tablet (20 mg total) by mouth daily. 90 tablet 3   rOPINIRole (REQUIP) 0.25 MG tablet Take 3 tablets (0.75 mg total) by mouth at bedtime. 270 tablet 3   tadalafil (CIALIS) 20 MG tablet Take 20 mg by mouth daily as needed for erectile dysfunction.     XIIDRA 5 % SOLN      No facility-administered medications prior to visit.    PAST MEDICAL HISTORY: Past Medical History:  Diagnosis Date   Aortic stenosis    Ascending aortic aneurysm (HCC) 09/18/2018   Colon cancer (HCC)    Family history of adverse reaction to anesthesia    pt states his mother had 2 episodes of hyponatremia following anesthesia    GERD (gastroesophageal reflux disease)    Headache    Heart murmur    Hyperlipidemia 10/25/2016   Hypertension    pt is currently not taking any medications; pt states is borderline   Melanoma (HCC)    OSA on CPAP 09/18/2018   PAF (paroxysmal atrial fibrillation) (HCC) 06/13/2017   Perforated sigmoid colon (HCC)    PVC's (premature ventricular contractions) 10/25/2016   Tinnitus    Wears glasses     PAST SURGICAL HISTORY: Past Surgical History:  Procedure Laterality Date   AORTIC VALVE REPLACEMENT N/A 06/08/2017   Procedure: AORTIC VALVE REPLACEMENT (AVR);  Surgeon: Alleen Borne, MD;  Location: [facility] OR;  Service: Open Heart Surgery;  Laterality: N/A;  Using 25mm Perimount Magna Ease  Pericardial Bioprosthesis Aortic Valve   COLOSTOMY CLOSURE N/A 08/18/2016   Procedure: COLOSTOMY CLOSURE OPEN PROCEDURE;  Surgeon: Darnell Level, MD;  Location: [facility] ORS;  Service: General;  Laterality: N/A;   LAPAROTOMY N/A 04/25/2016   Procedure: EXPLORATORY LAPAROTOMY WITH SIGMOID COLECTOMY, COLOSTOMY;  Surgeon: Darnell Level, MD;  Location: [facility] ORS;  Service: General;  Laterality: N/A;   LEFT HEART CATH AND CORONARY ANGIOGRAPHY N/A 11/03/2016   Procedure: Left Heart Cath and Coronary Angiography;  Surgeon: Kathleene Hazel, MD;  Location: [facility] INVASIVE CV LAB;  Service: Cardiovascular;  Laterality: N/A;   MELANOMA EXCISION     PARTIAL COLECTOMY Right 08/18/2016  Procedure: RIGHT COLECTOMY;  Surgeon: Darnell Level, MD;  Location: [facility] ORS;  Service: General;  Laterality: Right;   TEE WITHOUT CARDIOVERSION N/A 06/08/2017   Procedure: TRANSESOPHAGEAL ECHOCARDIOGRAM (TEE);  Surgeon: Alleen Borne, MD;  Location: [facility] OR;  Service: Open Heart Surgery;  Laterality: N/A;    FAMILY HISTORY: Family History  Problem Relation Age of Onset   Dementia Mother    Kidney disease Mother    Vascular Disease Mother    CAD Mother    Heart attack Father    Alcohol abuse Father    Cirrhosis Father    Stroke Maternal Grandmother    Stroke Paternal Grandmother     SOCIAL HISTORY: Social History   Socioeconomic History   Marital status: Married    Spouse name: Not on file   Number of children: Not on file   Years of education: Not on file   Highest education level: Professional school degree (e.g., MD, DDS, DVM, JD)  Occupational History   Occupation: Retired Teacher, early years/pre  Tobacco Use   Smoking status: Never   Smokeless tobacco: Never  Vaping Use   Vaping status: Never Used  Substance and Sexual Activity   Alcohol use: Yes    Alcohol/week: 21.0 - 24.0 standard drinks of alcohol    Types: 21 - 24 Standard drinks or equivalent per week    Comment: 2-3 glasses of wine daily    Drug use: No   Sexual  activity: Not on file  Other Topics Concern   Not on file  Social History Narrative   Pt lives in [place] with his wife, they are active.      Pt lives at home with his wife.      1 Cat    Social Drivers of Corporate investment banker Strain: Low Risk  (11/01/2023)   Overall Financial Resource Strain (CARDIA)    Difficulty of Paying Living Expenses: Not hard at all  Food Insecurity: No Food Insecurity (11/01/2023)   Hunger Vital Sign    Worried About Running Out of Food in the Last Year: Never true    Ran Out of Food in the Last Year: Never true  Transportation Needs: Unknown (11/01/2023)   PRAPARE - Administrator, Civil Service (Medical): No    Lack of Transportation (Non-Medical): Not on file  Physical Activity: Insufficiently Active (11/01/2023)   Exercise Vital Sign    Days of Exercise per Week: 2 days    Minutes of Exercise per Session: 30 min  Stress: Stress Concern Present (11/01/2023)   [employer] of Occupational Health - Occupational Stress Questionnaire    Feeling of Stress : To some extent  Social Connections: Socially Isolated (11/01/2023)   Social Connection and Isolation Panel [NHANES]    Frequency of Communication with Friends and Family: Once a week    Frequency of Social Gatherings with Friends and Family: Once a week    Attends Religious Services: Never    Database administrator or Organizations: No    Attends Engineer, structural: Not on file    Marital Status: Married  Catering manager Violence: Not At Risk (01/06/2022)   Humiliation, Afraid, Rape, and Kick questionnaire    Fear of Current or Ex-Partner: No    Emotionally Abused: No    Physically Abused: No    Sexually Abused: No     PHYSICAL EXAM  There were no vitals filed for this visit.   There is no height or weight on  file to calculate BMI.  Generalized: Well developed, in no acute distress  Cardiology: normal rate and rhythm, no murmur noted Respiratory: clear  to auscultation bilaterally  Neurological examination  Mentation: Alert oriented to time, place, history taking. Follows all commands speech and language fluent Cranial nerve II-XII: Pupils were equal round reactive to light. Extraocular movements were full, visual field were full  Motor: The motor testing reveals 5 over 5 strength of all 4 extremities. Good symmetric motor tone is noted throughout.  Gait and station: Gait is normal.    DIAGNOSTIC DATA (LABS, IMAGING, TESTING) - I reviewed patient records, labs, notes, testing and imaging myself where available.     03/28/2018    8:17 AM  MMSE - Mini Mental State Exam  Orientation to time 5  Orientation to Place 5  Registration 3  Attention/ Calculation 5  Recall 3  Language- name 2 objects 2  Language- repeat 1  Language- follow 3 step command 3  Language- read & follow direction 1  Write a sentence 1  Copy design 1  Total score 30     Lab Results  Component Value Date   WBC 5.5 05/02/2023   HGB 16.3 05/02/2023   HCT 48.5 05/02/2023   MCV 96.7 05/02/2023   PLT 196.0 05/02/2023      Component Value Date/Time   NA 138 07/13/2023 0946   K 4.6 07/13/2023 0946   CL 100 07/13/2023 0946   CO2 23 07/13/2023 0946   GLUCOSE 94 07/13/2023 0946   GLUCOSE 174 (H) 05/02/2023 1104   BUN 12 07/13/2023 0946   CREATININE 1.30 (H) 08/22/2023 0902   CREATININE 1.04 10/25/2016 1547   CALCIUM 10.0 07/13/2023 0946   PROT 6.8 07/13/2023 0946   ALBUMIN 4.4 07/13/2023 0946   AST 28 07/13/2023 0946   ALT 42 07/13/2023 0946   ALKPHOS 83 07/13/2023 0946   BILITOT 0.8 07/13/2023 0946   GFRNONAA >60 01/06/2021 0110   GFRAA 77 09/01/2020 0925   Lab Results  Component Value Date   CHOL 155 07/13/2023   HDL 52 07/13/2023   LDLCALC 80 07/13/2023   LDLDIRECT 105.0 05/02/2023   TRIG 132 07/13/2023   CHOLHDL 3.0 07/13/2023   Lab Results  Component Value Date   HGBA1C 5.5 05/03/2023   Lab Results  Component Value Date   VITAMINB12  347 05/30/2018   Lab Results  Component Value Date   TSH 1.44 05/02/2023     ASSESSMENT AND PLAN 73 y.o. year old male  has a past medical history of Aortic stenosis, Ascending aortic aneurysm (HCC) (09/18/2018), Colon cancer (HCC), Family history of adverse reaction to anesthesia, GERD (gastroesophageal reflux disease), Headache, Heart murmur, Hyperlipidemia (10/25/2016), Hypertension, Melanoma (HCC), OSA on CPAP (09/18/2018), PAF (paroxysmal atrial fibrillation) (HCC) (06/13/2017), Perforated sigmoid colon (HCC), PVC's (premature ventricular contractions) (10/25/2016), Tinnitus, and Wears glasses. here with   No diagnosis found.     Duaine Radin is doing well on CPAP therapy. Compliance report reveals excellent compliance. He was encouraged to continue using CPAP nightly and for greater than 4 hours each night. We will update supply orders as indicated. Risks of untreated sleep apnea review and education materials provided. He will continue ropinirole 0.75mg  at bedtime. May take 0.25mg  at dinner and 0.5mg  at bedtime if needed. We discussed concerns of worsening fatigue, low motivation and low energy. I have advised he make an appt with PCP to discuss. Healthy lifestyle habits encouraged. He will follow up in 1 year. He verbalizes understanding  and agreement with this plan.    No orders of the defined types were placed in this encounter.    No orders of the defined types were placed in this encounter.     Shawnie Dapper, FNP-C 11/02/2023, 3:18 PM [facility] [address] [place], [place] [address] [phone]

## 2023-11-02 NOTE — Assessment & Plan Note (Signed)
Deteriorated.  Pt has gained 8lbs since October.  Not exercising as regularly as before.  Likely mood related.  Will follow.

## 2023-11-02 NOTE — Assessment & Plan Note (Signed)
Chronic problem.  Currently on Pravastatin 20mg daily and Zetia 10mg daily w/o difficulty.  Check labs.  Adjust meds prn  

## 2023-11-02 NOTE — Assessment & Plan Note (Signed)
Chronic problem.  Excellent control.  Currently asymptomatic.  Check labs due to ARB use but no anticipated med  changes.

## 2023-11-02 NOTE — Patient Instructions (Signed)
Follow up in 3-4 weeks to recheck mood We'll notify you of your lab results and make any changes if needed START the Fluoxetine daily Try and get back to regular exercise to improve stamina Call with any questions or concerns Stay Safe!  Stay Healthy! Hang in there!

## 2023-11-03 ENCOUNTER — Inpatient Hospital Stay (HOSPITAL_COMMUNITY)
Admission: EM | Admit: 2023-11-03 | Discharge: 2023-11-07 | DRG: 481 | Payer: Medicare Other | Attending: Internal Medicine | Admitting: Internal Medicine

## 2023-11-03 ENCOUNTER — Encounter (HOSPITAL_COMMUNITY)
Admission: EM | Disposition: A | Discharge status: Other Acute Inpatient Hospital | Payer: Self-pay | Source: Home / Self Care | Attending: Internal Medicine

## 2023-11-03 ENCOUNTER — Other Ambulatory Visit: Payer: Self-pay

## 2023-11-03 ENCOUNTER — Inpatient Hospital Stay (HOSPITAL_COMMUNITY): Payer: Medicare Other

## 2023-11-03 ENCOUNTER — Inpatient Hospital Stay (HOSPITAL_COMMUNITY): Payer: Medicare Other | Admitting: Anesthesiology

## 2023-11-03 ENCOUNTER — Encounter (HOSPITAL_COMMUNITY): Payer: Self-pay

## 2023-11-03 ENCOUNTER — Emergency Department (HOSPITAL_COMMUNITY): Payer: Medicare Other

## 2023-11-03 DIAGNOSIS — I10 Essential (primary) hypertension: Secondary | ICD-10-CM

## 2023-11-03 DIAGNOSIS — Z7982 Long term (current) use of aspirin: Secondary | ICD-10-CM | POA: Diagnosis not present

## 2023-11-03 DIAGNOSIS — Z79899 Other long term (current) drug therapy: Secondary | ICD-10-CM | POA: Diagnosis not present

## 2023-11-03 DIAGNOSIS — Z1152 Encounter for screening for COVID-19: Secondary | ICD-10-CM | POA: Diagnosis not present

## 2023-11-03 DIAGNOSIS — I2510 Atherosclerotic heart disease of native coronary artery without angina pectoris: Secondary | ICD-10-CM | POA: Diagnosis present

## 2023-11-03 DIAGNOSIS — N179 Acute kidney failure, unspecified: Secondary | ICD-10-CM | POA: Diagnosis present

## 2023-11-03 DIAGNOSIS — E039 Hypothyroidism, unspecified: Secondary | ICD-10-CM

## 2023-11-03 DIAGNOSIS — S7221XA Displaced subtrochanteric fracture of right femur, initial encounter for closed fracture: Principal | ICD-10-CM | POA: Diagnosis present

## 2023-11-03 DIAGNOSIS — S79001A Unspecified physeal fracture of upper end of right femur, initial encounter for closed fracture: Secondary | ICD-10-CM | POA: Diagnosis not present

## 2023-11-03 DIAGNOSIS — S72001A Fracture of unspecified part of neck of right femur, initial encounter for closed fracture: Secondary | ICD-10-CM | POA: Diagnosis not present

## 2023-11-03 DIAGNOSIS — Z85038 Personal history of other malignant neoplasm of large intestine: Secondary | ICD-10-CM | POA: Diagnosis not present

## 2023-11-03 DIAGNOSIS — I480 Paroxysmal atrial fibrillation: Secondary | ICD-10-CM | POA: Diagnosis not present

## 2023-11-03 DIAGNOSIS — Y9301 Activity, walking, marching and hiking: Secondary | ICD-10-CM | POA: Diagnosis present

## 2023-11-03 DIAGNOSIS — E785 Hyperlipidemia, unspecified: Secondary | ICD-10-CM | POA: Diagnosis present

## 2023-11-03 DIAGNOSIS — I82612 Acute embolism and thrombosis of superficial veins of left upper extremity: Secondary | ICD-10-CM | POA: Diagnosis not present

## 2023-11-03 DIAGNOSIS — D62 Acute posthemorrhagic anemia: Secondary | ICD-10-CM | POA: Diagnosis not present

## 2023-11-03 DIAGNOSIS — Z85820 Personal history of malignant melanoma of skin: Secondary | ICD-10-CM

## 2023-11-03 DIAGNOSIS — I428 Other cardiomyopathies: Secondary | ICD-10-CM | POA: Diagnosis not present

## 2023-11-03 DIAGNOSIS — I7121 Aneurysm of the ascending aorta, without rupture: Secondary | ICD-10-CM | POA: Diagnosis not present

## 2023-11-03 DIAGNOSIS — Z7901 Long term (current) use of anticoagulants: Secondary | ICD-10-CM | POA: Diagnosis not present

## 2023-11-03 DIAGNOSIS — I350 Nonrheumatic aortic (valve) stenosis: Secondary | ICD-10-CM | POA: Diagnosis present

## 2023-11-03 DIAGNOSIS — M21251 Flexion deformity, right hip: Secondary | ICD-10-CM | POA: Diagnosis present

## 2023-11-03 DIAGNOSIS — Z79890 Hormone replacement therapy (postmenopausal): Secondary | ICD-10-CM

## 2023-11-03 DIAGNOSIS — S72401A Unspecified fracture of lower end of right femur, initial encounter for closed fracture: Secondary | ICD-10-CM | POA: Diagnosis not present

## 2023-11-03 DIAGNOSIS — E669 Obesity, unspecified: Secondary | ICD-10-CM | POA: Diagnosis not present

## 2023-11-03 DIAGNOSIS — Y92008 Other place in unspecified non-institutional (private) residence as the place of occurrence of the external cause: Secondary | ICD-10-CM | POA: Diagnosis not present

## 2023-11-03 DIAGNOSIS — M25551 Pain in right hip: Secondary | ICD-10-CM | POA: Diagnosis not present

## 2023-11-03 DIAGNOSIS — Z6829 Body mass index (BMI) 29.0-29.9, adult: Secondary | ICD-10-CM | POA: Diagnosis not present

## 2023-11-03 DIAGNOSIS — S7291XA Unspecified fracture of right femur, initial encounter for closed fracture: Principal | ICD-10-CM

## 2023-11-03 DIAGNOSIS — Z8249 Family history of ischemic heart disease and other diseases of the circulatory system: Secondary | ICD-10-CM

## 2023-11-03 DIAGNOSIS — W1839XA Other fall on same level, initial encounter: Secondary | ICD-10-CM | POA: Diagnosis present

## 2023-11-03 DIAGNOSIS — W19XXXA Unspecified fall, initial encounter: Secondary | ICD-10-CM | POA: Diagnosis not present

## 2023-11-03 DIAGNOSIS — S72001D Fracture of unspecified part of neck of right femur, subsequent encounter for closed fracture with routine healing: Secondary | ICD-10-CM | POA: Diagnosis not present

## 2023-11-03 DIAGNOSIS — S72001S Fracture of unspecified part of neck of right femur, sequela: Secondary | ICD-10-CM | POA: Diagnosis not present

## 2023-11-03 DIAGNOSIS — G2581 Restless legs syndrome: Secondary | ICD-10-CM | POA: Diagnosis present

## 2023-11-03 DIAGNOSIS — S72331A Displaced oblique fracture of shaft of right femur, initial encounter for closed fracture: Secondary | ICD-10-CM | POA: Diagnosis not present

## 2023-11-03 DIAGNOSIS — S72121D Displaced fracture of lesser trochanter of right femur, subsequent encounter for closed fracture with routine healing: Secondary | ICD-10-CM | POA: Diagnosis not present

## 2023-11-03 DIAGNOSIS — I824Y1 Acute embolism and thrombosis of unspecified deep veins of right proximal lower extremity: Secondary | ICD-10-CM | POA: Diagnosis not present

## 2023-11-03 DIAGNOSIS — G4733 Obstructive sleep apnea (adult) (pediatric): Secondary | ICD-10-CM | POA: Diagnosis not present

## 2023-11-03 DIAGNOSIS — K5900 Constipation, unspecified: Secondary | ICD-10-CM | POA: Diagnosis present

## 2023-11-03 DIAGNOSIS — D72829 Elevated white blood cell count, unspecified: Secondary | ICD-10-CM | POA: Diagnosis not present

## 2023-11-03 DIAGNOSIS — W19XXXD Unspecified fall, subsequent encounter: Secondary | ICD-10-CM | POA: Diagnosis present

## 2023-11-03 DIAGNOSIS — M7989 Other specified soft tissue disorders: Secondary | ICD-10-CM | POA: Diagnosis not present

## 2023-11-03 DIAGNOSIS — F428 Other obsessive-compulsive disorder: Secondary | ICD-10-CM | POA: Diagnosis not present

## 2023-11-03 DIAGNOSIS — R000 Tachycardia, unspecified: Secondary | ICD-10-CM | POA: Diagnosis not present

## 2023-11-03 DIAGNOSIS — D696 Thrombocytopenia, unspecified: Secondary | ICD-10-CM | POA: Diagnosis not present

## 2023-11-03 DIAGNOSIS — F32A Depression, unspecified: Secondary | ICD-10-CM | POA: Diagnosis not present

## 2023-11-03 DIAGNOSIS — K429 Umbilical hernia without obstruction or gangrene: Secondary | ICD-10-CM | POA: Diagnosis not present

## 2023-11-03 DIAGNOSIS — Z952 Presence of prosthetic heart valve: Secondary | ICD-10-CM | POA: Diagnosis not present

## 2023-11-03 DIAGNOSIS — M62838 Other muscle spasm: Secondary | ICD-10-CM | POA: Diagnosis not present

## 2023-11-03 DIAGNOSIS — R7989 Other specified abnormal findings of blood chemistry: Secondary | ICD-10-CM | POA: Diagnosis not present

## 2023-11-03 DIAGNOSIS — I82491 Acute embolism and thrombosis of other specified deep vein of right lower extremity: Secondary | ICD-10-CM | POA: Diagnosis not present

## 2023-11-03 HISTORY — PX: FEMUR IM NAIL: SHX1597

## 2023-11-03 LAB — CBC
HCT: 46.9 % (ref 39.0–52.0)
Hemoglobin: 16.6 g/dL (ref 13.0–17.0)
MCH: 32.7 pg (ref 26.0–34.0)
MCHC: 35.4 g/dL (ref 30.0–36.0)
MCV: 92.5 fL (ref 80.0–100.0)
Platelets: 199 10*3/uL (ref 150–400)
RBC: 5.07 MIL/uL (ref 4.22–5.81)
RDW: 12.5 % (ref 11.5–15.5)
WBC: 15.7 10*3/uL — ABNORMAL HIGH (ref 4.0–10.5)
nRBC: 0 % (ref 0.0–0.2)

## 2023-11-03 LAB — CBC WITH DIFFERENTIAL/PLATELET
Abs Immature Granulocytes: 0.03 10*3/uL (ref 0.00–0.07)
Basophils Absolute: 0 10*3/uL (ref 0.0–0.1)
Basophils Relative: 1 %
Eosinophils Absolute: 0.1 10*3/uL (ref 0.0–0.5)
Eosinophils Relative: 1 %
HCT: 47.8 % (ref 39.0–52.0)
Hemoglobin: 16.9 g/dL (ref 13.0–17.0)
Immature Granulocytes: 0 %
Lymphocytes Relative: 19 %
Lymphs Abs: 1.4 10*3/uL (ref 0.7–4.0)
MCH: 32.9 pg (ref 26.0–34.0)
MCHC: 35.4 g/dL (ref 30.0–36.0)
MCV: 93 fL (ref 80.0–100.0)
Monocytes Absolute: 0.7 10*3/uL (ref 0.1–1.0)
Monocytes Relative: 9 %
Neutro Abs: 5.5 10*3/uL (ref 1.7–7.7)
Neutrophils Relative %: 70 %
Platelets: 183 10*3/uL (ref 150–400)
RBC: 5.14 MIL/uL (ref 4.22–5.81)
RDW: 12.3 % (ref 11.5–15.5)
WBC: 7.8 10*3/uL (ref 4.0–10.5)
nRBC: 0 % (ref 0.0–0.2)

## 2023-11-03 LAB — COMPREHENSIVE METABOLIC PANEL
ALT: 44 U/L (ref 0–44)
AST: 31 U/L (ref 15–41)
Albumin: 4.1 g/dL (ref 3.5–5.0)
Alkaline Phosphatase: 63 U/L (ref 38–126)
Anion gap: 13 (ref 5–15)
BUN: 13 mg/dL (ref 8–23)
CO2: 21 mmol/L — ABNORMAL LOW (ref 22–32)
Calcium: 9.4 mg/dL (ref 8.9–10.3)
Chloride: 105 mmol/L (ref 98–111)
Creatinine, Ser: 1.02 mg/dL (ref 0.61–1.24)
GFR, Estimated: >60 mL/min (ref >60)
Glucose, Bld: 113 mg/dL — ABNORMAL HIGH (ref 70–99)
Potassium: 4.2 mmol/L (ref 3.5–5.1)
Sodium: 139 mmol/L (ref 135–145)
Total Bilirubin: 0.7 mg/dL (ref 0.0–1.2)
Total Protein: 6.6 g/dL (ref 6.5–8.1)

## 2023-11-03 LAB — BASIC METABOLIC PANEL
Anion gap: 12 (ref 5–15)
BUN: 13 mg/dL (ref 8–23)
CO2: 22 mmol/L (ref 22–32)
Calcium: 9.1 mg/dL (ref 8.9–10.3)
Chloride: 104 mmol/L (ref 98–111)
Creatinine, Ser: 0.92 mg/dL (ref 0.61–1.24)
GFR, Estimated: >60 mL/min (ref >60)
Glucose, Bld: 133 mg/dL — ABNORMAL HIGH (ref 70–99)
Potassium: 4.2 mmol/L (ref 3.5–5.1)
Sodium: 138 mmol/L (ref 135–145)

## 2023-11-03 LAB — HEPATITIS C ANTIBODY: Hepatitis C Ab: NONREACTIVE

## 2023-11-03 SURGERY — INSERTION, INTRAMEDULLARY ROD, FEMUR
Anesthesia: General | Site: Hip | Laterality: Right

## 2023-11-03 MED ORDER — FENTANYL CITRATE (PF) 250 MCG/5ML IJ SOLN
INTRAMUSCULAR | Status: AC
  Filled 2023-11-03: qty 5

## 2023-11-03 MED ORDER — ROCURONIUM BROMIDE 10 MG/ML (PF) SYRINGE
PREFILLED_SYRINGE | INTRAVENOUS | Status: DC | PRN
  Administered 2023-11-03: 30 mg via INTRAVENOUS
  Administered 2023-11-03: 10 mg via INTRAVENOUS
  Administered 2023-11-03: 20 mg via INTRAVENOUS
  Administered 2023-11-03: 10 mg via INTRAVENOUS

## 2023-11-03 MED ORDER — LOSARTAN POTASSIUM 50 MG PO TABS
100.0000 mg | ORAL_TABLET | Freq: Every day | ORAL | Status: DC
  Administered 2023-11-03: 100 mg via ORAL
  Filled 2023-11-03: qty 2

## 2023-11-03 MED ORDER — ROPINIROLE HCL 0.5 MG PO TABS
0.7500 mg | ORAL_TABLET | Freq: Every day | ORAL | Status: DC
  Administered 2023-11-03 – 2023-11-06 (×4): 0.75 mg via ORAL
  Filled 2023-11-03: qty 2
  Filled 2023-11-03: qty 1
  Filled 2023-11-03 (×3): qty 2

## 2023-11-03 MED ORDER — CEFAZOLIN SODIUM-DEXTROSE 2-4 GM/100ML-% IV SOLN
2.0000 g | Freq: Once | INTRAVENOUS | Status: AC
  Administered 2023-11-03: 2 g via INTRAVENOUS

## 2023-11-03 MED ORDER — CEFAZOLIN SODIUM-DEXTROSE 2-4 GM/100ML-% IV SOLN
2.0000 g | Freq: Three times a day (TID) | INTRAVENOUS | Status: AC
  Administered 2023-11-03 – 2023-11-04 (×3): 2 g via INTRAVENOUS
  Filled 2023-11-03 (×3): qty 100

## 2023-11-03 MED ORDER — ACETAMINOPHEN 500 MG PO TABS
ORAL_TABLET | ORAL | Status: AC
  Administered 2023-11-03: 1000 mg via ORAL
  Filled 2023-11-03: qty 2

## 2023-11-03 MED ORDER — FENTANYL CITRATE (PF) 250 MCG/5ML IJ SOLN
INTRAMUSCULAR | Status: DC | PRN
  Administered 2023-11-03 (×3): 50 ug via INTRAVENOUS
  Administered 2023-11-03: 100 ug via INTRAVENOUS

## 2023-11-03 MED ORDER — LEVOTHYROXINE SODIUM 100 MCG PO TABS
100.0000 ug | ORAL_TABLET | Freq: Every day | ORAL | Status: DC
  Administered 2023-11-03 – 2023-11-07 (×5): 100 ug via ORAL
  Filled 2023-11-03 (×5): qty 1

## 2023-11-03 MED ORDER — MORPHINE SULFATE (PF) 2 MG/ML IV SOLN
1.0000 mg | INTRAVENOUS | Status: DC | PRN
  Administered 2023-11-03 (×3): 3 mg via INTRAVENOUS
  Filled 2023-11-03 (×3): qty 2

## 2023-11-03 MED ORDER — LIDOCAINE 2% (20 MG/ML) 5 ML SYRINGE
INTRAMUSCULAR | Status: AC
  Filled 2023-11-03: qty 5

## 2023-11-03 MED ORDER — ONDANSETRON HCL 4 MG/2ML IJ SOLN
4.0000 mg | Freq: Once | INTRAMUSCULAR | Status: AC
  Administered 2023-11-03: 4 mg via INTRAVENOUS
  Filled 2023-11-03: qty 2

## 2023-11-03 MED ORDER — ONDANSETRON HCL 4 MG/2ML IJ SOLN
INTRAMUSCULAR | Status: DC | PRN
  Administered 2023-11-03: 4 mg via INTRAVENOUS

## 2023-11-03 MED ORDER — ROCURONIUM BROMIDE 10 MG/ML (PF) SYRINGE
PREFILLED_SYRINGE | INTRAVENOUS | Status: AC
  Filled 2023-11-03: qty 10

## 2023-11-03 MED ORDER — ORAL CARE MOUTH RINSE: 15.0000 mL | Freq: Once | OROMUCOSAL | Status: AC

## 2023-11-03 MED ORDER — SUCCINYLCHOLINE CHLORIDE 200 MG/10ML IV SOSY
PREFILLED_SYRINGE | INTRAVENOUS | Status: DC | PRN
  Administered 2023-11-03: 100 mg via INTRAVENOUS

## 2023-11-03 MED ORDER — TRANEXAMIC ACID-NACL 1000-0.7 MG/100ML-% IV SOLN
1000.0000 mg | INTRAVENOUS | Status: AC
  Administered 2023-11-03: 1000 mg via INTRAVENOUS

## 2023-11-03 MED ORDER — CEFAZOLIN SODIUM-DEXTROSE 2-4 GM/100ML-% IV SOLN
INTRAVENOUS | Status: AC
  Filled 2023-11-03: qty 100

## 2023-11-03 MED ORDER — PROPOFOL 10 MG/ML IV BOLUS
INTRAVENOUS | Status: AC
  Filled 2023-11-03: qty 20

## 2023-11-03 MED ORDER — BISACODYL 5 MG PO TBEC: 5.0000 mg | DELAYED_RELEASE_TABLET | Freq: Every day | ORAL | Status: DC | PRN

## 2023-11-03 MED ORDER — 0.9 % SODIUM CHLORIDE (POUR BTL) OPTIME
TOPICAL | Status: DC | PRN
  Administered 2023-11-03: 1000 mL

## 2023-11-03 MED ORDER — CHLORHEXIDINE GLUCONATE 0.12 % MT SOLN: 15.0000 mL | Freq: Once | OROMUCOSAL | Status: AC

## 2023-11-03 MED ORDER — PROCHLORPERAZINE EDISYLATE 10 MG/2ML IJ SOLN: 5.0000 mg | Freq: Four times a day (QID) | INTRAMUSCULAR | Status: DC | PRN

## 2023-11-03 MED ORDER — PRAVASTATIN SODIUM 40 MG PO TABS
20.0000 mg | ORAL_TABLET | Freq: Every day | ORAL | Status: DC
  Administered 2023-11-03 – 2023-11-06 (×4): 20 mg via ORAL
  Filled 2023-11-03 (×4): qty 1

## 2023-11-03 MED ORDER — LACTATED RINGERS IV SOLN: INTRAVENOUS | Status: DC

## 2023-11-03 MED ORDER — CHLORHEXIDINE GLUCONATE 0.12 % MT SOLN
OROMUCOSAL | Status: AC
Start: 2023-11-03 — End: 2023-11-03
  Administered 2023-11-03: 15 mL via OROMUCOSAL
  Filled 2023-11-03: qty 15

## 2023-11-03 MED ORDER — HYDROMORPHONE HCL 1 MG/ML IJ SOLN: 0.2500 mg | INTRAMUSCULAR | Status: DC | PRN

## 2023-11-03 MED ORDER — DEXAMETHASONE SODIUM PHOSPHATE 10 MG/ML IJ SOLN
INTRAMUSCULAR | Status: DC | PRN
  Administered 2023-11-03: 5 mg via INTRAVENOUS

## 2023-11-03 MED ORDER — ACETAMINOPHEN 500 MG PO TABS: 1000.0000 mg | ORAL_TABLET | Freq: Once | ORAL | Status: AC

## 2023-11-03 MED ORDER — TRANEXAMIC ACID-NACL 1000-0.7 MG/100ML-% IV SOLN
INTRAVENOUS | Status: AC
  Filled 2023-11-03: qty 100

## 2023-11-03 MED ORDER — OXYCODONE HCL 5 MG PO TABS: 5.0000 mg | ORAL_TABLET | Freq: Once | ORAL | Status: DC | PRN

## 2023-11-03 MED ORDER — SENNOSIDES-DOCUSATE SODIUM 8.6-50 MG PO TABS
1.0000 | ORAL_TABLET | Freq: Every evening | ORAL | Status: DC | PRN
  Administered 2023-11-04 – 2023-11-06 (×2): 1 via ORAL
  Filled 2023-11-03 (×2): qty 1

## 2023-11-03 MED ORDER — SUCCINYLCHOLINE CHLORIDE 200 MG/10ML IV SOSY
PREFILLED_SYRINGE | INTRAVENOUS | Status: AC
  Filled 2023-11-03: qty 10

## 2023-11-03 MED ORDER — HYDROMORPHONE HCL 1 MG/ML IJ SOLN
1.0000 mg | Freq: Once | INTRAMUSCULAR | Status: AC
  Administered 2023-11-03: 1 mg via INTRAVENOUS
  Filled 2023-11-03: qty 1

## 2023-11-03 MED ORDER — DEXMEDETOMIDINE HCL IN NACL 200 MCG/50ML IV SOLN
INTRAVENOUS | Status: DC | PRN
  Administered 2023-11-03 (×4): 8 ug via INTRAVENOUS

## 2023-11-03 MED ORDER — DILTIAZEM HCL ER COATED BEADS 120 MG PO CP24
120.0000 mg | ORAL_CAPSULE | Freq: Every day | ORAL | Status: DC
  Administered 2023-11-03 – 2023-11-06 (×3): 120 mg via ORAL
  Filled 2023-11-03 (×5): qty 1

## 2023-11-03 MED ORDER — BUPIVACAINE HCL (PF) 0.25 % IJ SOLN
INTRAMUSCULAR | Status: AC
  Filled 2023-11-03: qty 30

## 2023-11-03 MED ORDER — OXYCODONE HCL 5 MG PO TABS
5.0000 mg | ORAL_TABLET | ORAL | Status: DC | PRN
  Administered 2023-11-03 – 2023-11-07 (×19): 5 mg via ORAL
  Filled 2023-11-03 (×19): qty 1

## 2023-11-03 MED ORDER — SUGAMMADEX SODIUM 200 MG/2ML IV SOLN
INTRAVENOUS | Status: AC
  Filled 2023-11-03: qty 2

## 2023-11-03 MED ORDER — BUPIVACAINE HCL 0.25 % IJ SOLN
INTRAMUSCULAR | Status: DC | PRN
  Administered 2023-11-03: 30 mL

## 2023-11-03 MED ORDER — HYDROMORPHONE HCL 1 MG/ML IJ SOLN
0.5000 mg | INTRAMUSCULAR | Status: DC | PRN
  Administered 2023-11-03 – 2023-11-07 (×15): 1 mg via INTRAVENOUS
  Filled 2023-11-03 (×16): qty 1

## 2023-11-03 MED ORDER — LIDOCAINE 2% (20 MG/ML) 5 ML SYRINGE
INTRAMUSCULAR | Status: DC | PRN
  Administered 2023-11-03: 50 mg via INTRAVENOUS

## 2023-11-03 MED ORDER — PROPOFOL 10 MG/ML IV BOLUS
INTRAVENOUS | Status: DC | PRN
  Administered 2023-11-03: 200 mg via INTRAVENOUS

## 2023-11-03 MED ORDER — ACETAMINOPHEN 325 MG PO TABS
650.0000 mg | ORAL_TABLET | Freq: Four times a day (QID) | ORAL | Status: DC | PRN
  Administered 2023-11-03 – 2023-11-07 (×4): 650 mg via ORAL
  Filled 2023-11-03 (×4): qty 2

## 2023-11-03 MED ORDER — AMISULPRIDE (ANTIEMETIC) 5 MG/2ML IV SOLN: 10.0000 mg | Freq: Once | INTRAVENOUS | Status: DC | PRN

## 2023-11-03 MED ORDER — OXYCODONE HCL 5 MG/5ML PO SOLN: 5.0000 mg | Freq: Once | ORAL | Status: DC | PRN

## 2023-11-03 MED ORDER — SUGAMMADEX SODIUM 200 MG/2ML IV SOLN
INTRAVENOUS | Status: DC | PRN
  Administered 2023-11-03: 200 mg via INTRAVENOUS

## 2023-11-03 SURGICAL SUPPLY — 42 items
BAG COUNTER SPONGE SURGICOUNT (BAG) ×1 IMPLANT
BIT DRILL INTERTAN LAG SCREW (BIT) IMPLANT
BIT DRILL SHORT 4.0 (BIT) IMPLANT
BNDG COHESIVE 4X5 TAN STRL LF (GAUZE/BANDAGES/DRESSINGS) ×1 IMPLANT
BNDG COHESIVE 6X5 TAN ST LF (GAUZE/BANDAGES/DRESSINGS) ×1 IMPLANT
COVER PERINEAL POST (MISCELLANEOUS) ×1 IMPLANT
COVER SURGICAL LIGHT HANDLE (MISCELLANEOUS) ×1 IMPLANT
DRAPE C-ARM 42X72 X-RAY (DRAPES) IMPLANT
DRAPE C-ARMOR (DRAPES) IMPLANT
DRAPE HALF SHEET 40X57 (DRAPES) IMPLANT
DRAPE INCISE IOBAN 66X45 STRL (DRAPES) IMPLANT
DRAPE STERI IOBAN 125X83 (DRAPES) ×3 IMPLANT
DRAPE SURG ORHT 6 SPLT 77X108 (DRAPES) IMPLANT
DRSG TEGADERM 2-3/8X2-3/4 SM (GAUZE/BANDAGES/DRESSINGS) IMPLANT
DRSG TEGADERM 4X4.75 (GAUZE/BANDAGES/DRESSINGS) ×2 IMPLANT
DURAPREP 26ML APPLICATOR (WOUND CARE) ×1 IMPLANT
ELECT REM PT RETURN 9FT ADLT (ELECTROSURGICAL) ×1 IMPLANT
ELECTRODE REM PT RTRN 9FT ADLT (ELECTROSURGICAL) ×1 IMPLANT
GAUZE SPONGE 4X4 12PLY STRL LF (GAUZE/BANDAGES/DRESSINGS) ×1 IMPLANT
GAUZE XEROFORM 1X8 LF (GAUZE/BANDAGES/DRESSINGS) ×1 IMPLANT
GAUZE XEROFORM 5X9 LF (GAUZE/BANDAGES/DRESSINGS) IMPLANT
GLOVE BIO SURGEON STRL SZ7.5 (GLOVE) ×3 IMPLANT
GLOVE BIOGEL PI IND STRL 8 (GLOVE) ×2 IMPLANT
GOWN STRL REUS W/ TWL LRG LVL3 (GOWN DISPOSABLE) ×2 IMPLANT
GOWN STRL REUS W/TWL XL LVL3 (GOWN DISPOSABLE) ×1 IMPLANT
GUIDE PIN 3.2X343 (PIN) ×2 IMPLANT
GUIDE ROD 3.0 (MISCELLANEOUS) ×1 IMPLANT
KIT BASIN OR (CUSTOM PROCEDURE TRAY) ×1 IMPLANT
KIT TURNOVER KIT B (KITS) ×1 IMPLANT
MANIFOLD NEPTUNE II (INSTRUMENTS) ×1 IMPLANT
NAIL LOCK CANN 10X380 130D RT (Nail) IMPLANT
NS IRRIG 1000ML POUR BTL (IV SOLUTION) ×1 IMPLANT
PACK GENERAL/GYN (CUSTOM PROCEDURE TRAY) ×1 IMPLANT
PAD ARMBOARD 7.5X6 YLW CONV (MISCELLANEOUS) ×2 IMPLANT
PIN GUIDE 3.2X343MM (PIN) IMPLANT
ROD GUIDE 3.0 (MISCELLANEOUS) IMPLANT
SCREW LAG COMPR KIT 110/105 (Screw) IMPLANT
SCREW TRIGEN LOW PROF 5.0X40 (Screw) IMPLANT
STAPLER SKIN PROX WIDE 3.9 (STAPLE) IMPLANT
STAPLER VISISTAT 35W (STAPLE) ×1 IMPLANT
SUT VIC AB 0 CT1 27XBRD ANBCTR (SUTURE) IMPLANT
SUT VIC AB 2-0 CT1 TAPERPNT 27 (SUTURE) ×2 IMPLANT

## 2023-11-03 NOTE — ED Notes (Addendum)
This RN offered to apply ice pack to right hip, pt refused.

## 2023-11-03 NOTE — Anesthesia Preprocedure Evaluation (Addendum)
Anesthesia Evaluation  Patient identified by MRN, date of birth, ID band Patient awake    Reviewed: Allergy & Precautions, NPO status , Patient's Chart, lab work & pertinent test results  History of Anesthesia Complications Negative for: history of anesthetic complications  Airway Mallampati: III  TM Distance: >3 FB Neck ROM: Full   Comment: Previous grade I view with MAC 4, easy mask Dental  (+) Dental Advisory Given   Pulmonary neg shortness of breath, sleep apnea and Continuous Positive Airway Pressure Ventilation , neg COPD, neg recent URI   Pulmonary exam normal breath sounds clear to auscultation       Cardiovascular hypertension (losartan), Pt. on medications (-) angina + CAD (nonobstructive)  (-) Past MI, (-) Cardiac Stents and (-) CABG + dysrhythmias (PVCs) Atrial Fibrillation + Valvular Problems/Murmurs (AS s/p AVR 06/08/2017)  Rhythm:Regular Rate:Normal  HLD, ascending aorta aneurysm  TTE 07/17/2023: IMPRESSIONS    1. Left ventricular ejection fraction, by estimation, is 50 to 55%. The  left ventricle has low normal function. The left ventricle has no regional  wall motion abnormalities. There is mild left ventricular hypertrophy.  Left ventricular diastolic  parameters are indeterminate.   2. Right ventricular systolic function is normal. The right ventricular  size is normal. Tricuspid regurgitation signal is inadequate for assessing  PA pressure.   3. The mitral valve is normal in structure. Trivial mitral valve  regurgitation. No evidence of mitral stenosis.   4. Aortic dilatation noted. Aneurysm of the ascending aorta, measuring 46  mm.   5. The aortic valve has been repaired/replaced. Aortic valve  regurgitation is not visualized. There is a 25 mm bioprosthetic valve  present in the aortic position. Aortic valve mean gradient measures 13.0  mmHg, stable from prior echoes. 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 a day, last drank last night)  alcohol useH/o Colon cancer, g/o gastric bypass   Endo/Other  neg diabetesHypothyroidism    Renal/GU negative Renal ROS     Musculoskeletal   Abdominal   Peds  Hematology negative hematology ROS (+) Lab Results      Component                Value               Date                      WBC                      15.7 (H)            11/03/2023                HGB                      16.6                11/03/2023                HCT                      46.9  11/03/2023                MCV                      92.5                11/03/2023                PLT                      199                 11/03/2023              Anesthesia Other Findings   Reproductive/Obstetrics                             Anesthesia Physical Anesthesia Plan  ASA: 3  Anesthesia Plan: General   Post-op Pain Management: Tylenol PO (pre-op)*   Induction: Intravenous  PONV Risk Score and Plan: 2 and Ondansetron, Dexamethasone and Treatment may vary due to age or medical condition  Airway Management Planned: Oral ETT  Additional Equipment:   Intra-op Plan:   Post-operative Plan: Extubation in OR  Informed Consent: I have reviewed the patients History and Physical, chart, labs and discussed the procedure including the risks, benefits and alternatives for the proposed anesthesia with the patient or authorized representative who has indicated his/her understanding and acceptance.     Dental advisory given  Plan Discussed with: CRNA and Anesthesiologist  Anesthesia Plan Comments: (Risks of general anesthesia discussed including, but not limited to, sore throat, hoarse  voice, chipped/damaged teeth, injury to vocal cords, nausea and vomiting, allergic reactions, lung infection, heart attack, stroke, and death. All questions answered. )        Anesthesia Quick Evaluation

## 2023-11-03 NOTE — Consult Note (Signed)
ORTHOPAEDIC CONSULTATION  REQUESTING PHYSICIAN: Jerald Kief, MD  ASSESSMENT AND PLAN: 73 y.o. male with the following: Right Hip Subtrochanteric femur fracture  This patient requires inpatient admission to the hospitalist, to include preoperative clearance and perioperative medical management  - Weight Bearing Status/Activity: NWB Right lower extremity  - Additional recommended labs/tests: none  -VTE Prophylaxis: Please hold prior to OR; to resume POD#1 at the discretion of the primary team  - Pain control: Recommend PO pain medications PRN; judicious use of narcotics  - Follow-up plan: F/u 10-14 days postop  -Procedures: Plan for OR once patient has been medically optimized  Plan for Right Cephalomedullary nail     Chief Complaint: Right hip pain  HPI: [name] Brown is a 73 year old male with PMH aortic stenosis s/p bioprosthetic valve replacement, nonobstructive CAD Colon cancer s/p right colectomy, HLD, HTN, hypothyroidism who had mechanical fall at home last night. He is complaining of right hip and leg pain. Imaging showed a right subtrochanteric femur fracture. He denies any other injuries. Denies LOC. Denies numbness/tingling in extremity.   Past Medical History:  Diagnosis Date   Aortic stenosis    Ascending aortic aneurysm (HCC) 09/18/2018   Colon cancer (HCC)    Family history of adverse reaction to anesthesia    pt states his mother had 2 episodes of hyponatremia following anesthesia    GERD (gastroesophageal reflux disease)    Headache    Heart murmur    Hyperlipidemia 10/25/2016   Hypertension    pt is currently not taking any medications; pt states is borderline   Melanoma (HCC)    OSA on CPAP 09/18/2018   PAF (paroxysmal atrial fibrillation) (HCC) 06/13/2017   Perforated sigmoid colon (HCC)    PVC's (premature ventricular contractions) 10/25/2016   Tinnitus    Wears glasses    Past Surgical History:  Procedure Laterality Date   AORTIC VALVE REPLACEMENT  N/A 06/08/2017   Procedure: AORTIC VALVE REPLACEMENT (AVR);  Surgeon: Alleen Borne, MD;  Location: [facility] OR;  Service: Open Heart Surgery;  Laterality: N/A;  Using 25mm Perimount Magna Ease Pericardial Bioprosthesis Aortic Valve   COLOSTOMY CLOSURE N/A 08/18/2016   Procedure: COLOSTOMY CLOSURE OPEN PROCEDURE;  Surgeon: Darnell Level, MD;  Location: [facility] ORS;  Service: General;  Laterality: N/A;   LAPAROTOMY N/A 04/25/2016   Procedure: EXPLORATORY LAPAROTOMY WITH SIGMOID COLECTOMY, COLOSTOMY;  Surgeon: Darnell Level, MD;  Location: [facility];  Service: General;  Laterality: N/A;   LEFT HEART CATH AND CORONARY ANGIOGRAPHY N/A 11/03/2016   Procedure: Left Heart Cath and Coronary Angiography;  Surgeon: Kathleene Hazel, MD;  Location: [facility];  Service: Cardiovascular;  Laterality: N/A;   MELANOMA EXCISION     PARTIAL COLECTOMY Right 08/18/2016   Procedure: RIGHT COLECTOMY;  Surgeon: Darnell Level, MD;  Location: [facility];  Service: General;  Laterality: Right;   TEE WITHOUT CARDIOVERSION N/A 06/08/2017   Procedure: TRANSESOPHAGEAL ECHOCARDIOGRAM (TEE);  Surgeon: Alleen Borne, MD;  Location: [facility] OR;  Service: Open Heart Surgery;  Laterality: N/A;   Social History   Socioeconomic History   Marital status: Married    Spouse name: Not on file   Number of children: Not on file   Years of education: Not on file   Highest education level: Professional school degree (e.g., MD, DDS, DVM, JD)  Occupational History   Occupation: Retired Teacher, early years/pre  Tobacco Use   Smoking status: Never   Smokeless tobacco: Never  Vaping Use   Vaping status:  Never Used  Substance and Sexual Activity   Alcohol use: Yes    Alcohol/week: 21.0 - 24.0 standard drinks of alcohol    Types: 21 - 24 Standard drinks or equivalent per week    Comment: 2-3 glasses of wine daily    Drug use: No   Sexual activity: Not on file  Other Topics Concern   Not on file  Social History Narrative   Pt lives in [place] with his  wife, they are active.      Pt lives at home with his wife.      1 Cat    Social Drivers of Corporate investment banker Strain: Low Risk  (11/01/2023)   Overall Financial Resource Strain (CARDIA)    Difficulty of Paying Living Expenses: Not hard at all  Food Insecurity: No Food Insecurity (11/01/2023)   Hunger Vital Sign    Worried About Running Out of Food in the Last Year: Never true    Ran Out of Food in the Last Year: Never true  Transportation Needs: Unknown (11/01/2023)   PRAPARE - Administrator, Civil Service (Medical): No    Lack of Transportation (Non-Medical): Not on file  Physical Activity: Insufficiently Active (11/01/2023)   Exercise Vital Sign    Days of Exercise per Week: 2 days    Minutes of Exercise per Session: 30 min  Stress: Stress Concern Present (11/01/2023)   [employer] of Occupational Health - Occupational Stress Questionnaire    Feeling of Stress : To some extent  Social Connections: Socially Isolated (11/01/2023)   Social Connection and Isolation Panel [NHANES]    Frequency of Communication with Friends and Family: Once a week    Frequency of Social Gatherings with Friends and Family: Once a week    Attends Religious Services: Never    Database administrator or Organizations: No    Attends Engineer, structural: Not on file    Marital Status: Married   Family History  Problem Relation Age of Onset   Dementia Mother    Kidney disease Mother    Vascular Disease Mother    CAD Mother    Heart attack Father    Alcohol abuse Father    Cirrhosis Father    Stroke Maternal Grandmother    Stroke Paternal Grandmother    No Known Allergies Prior to Admission medications   Medication Sig Start Date End Date Taking? Authorizing Provider  aspirin EC 81 MG tablet Take 81 mg by mouth at bedtime.   Yes [provider]  Carboxymethylcellul-Glycerin (CLEAR EYES FOR DRY EYES OP) Place 1 Drop/kg into both eyes as needed (Severe dry  eye).   Yes [provider]  diltiazem (CARDIZEM CD) 120 MG 24 hr capsule TAKE 1 CAPSULE BY MOUTH DAILY Patient taking differently: Take 120 mg by mouth at bedtime. 10/09/23  Yes Chilton Si, MD  doxycycline (VIBRAMYCIN) 50 MG capsule Take 50 mg by mouth at bedtime. 05/30/23  Yes [provider]  ezetimibe (ZETIA) 10 MG tablet TAKE 1 TABLET BY MOUTH DAILY Patient taking differently: Take 10 mg by mouth at bedtime. 07/05/23  Yes Chilton Si, MD  famotidine (PEPCID) 20 MG tablet Take 20 mg by mouth daily as needed for heartburn or indigestion.   Yes [provider]  levothyroxine (SYNTHROID) 100 MCG tablet Take 1 tablet (100 mcg total) by mouth daily. 02/07/23  Yes Sheliah Hatch, MD  losartan (COZAAR) 100 MG tablet TAKE 1 TABLET BY MOUTH  DAILY Patient taking differently: Take 100 mg by mouth at bedtime. 11/02/22  Yes Chilton Si, MD  pravastatin (PRAVACHOL) 20 MG tablet Take 1 tablet (20 mg total) by mouth daily. Patient taking differently: Take 20 mg by mouth at bedtime. 11/02/22  Yes Chilton Si, MD  rOPINIRole (REQUIP) 0.25 MG tablet Take 3 tablets (0.75 mg total) by mouth at bedtime. 11/01/22  Yes Lomax, Amy, NP  XIIDRA 5 % SOLN Place 1 Dose into both eyes 2 (two) times daily. 09/25/23  Yes [provider]  FLUoxetine (PROZAC) 10 MG capsule Take 1 capsule (10 mg total) by mouth daily. 11/02/23   Sheliah Hatch, MD    Family History Reviewed and non-contributory, no pertinent history of problems with bleeding or anesthesia    Review of Systems No fevers or chills No numbness or tingling No chest pain No shortness of breath No bowel or bladder dysfunction No GI distress No headaches    OBJECTIVE  Vitals:Patient Vitals for the past 8 hrs:  BP Temp Temp src Pulse Resp SpO2  11/03/23 1000 (!) 139/99 -- -- 93 17 98 %  11/03/23 0905 -- 97.7 F (36.5 C) Oral -- -- --  11/03/23 0900 (!) 151/99 -- -- 85 12 97 %  11/03/23  0750 (!) 138/96 -- -- 91 20 97 %  11/03/23 0615 (!) 146/98 -- -- 94 15 96 %  11/03/23 0545 (!) 143/93 -- -- 90 15 97 %  11/03/23 0535 -- 97.6 F (36.4 C) Oral -- -- --  11/03/23 0415 (!) 154/100 -- -- 86 14 96 %  11/03/23 0345 (!) 167/94 -- -- 85 10 95 %  11/03/23 0315 (!) 150/93 -- -- 88 14 96 %  11/03/23 0230 (!) 163/92 -- -- 77 10 94 %   General: Alert, no acute distress Cardiovascular: Extremities are warm Respiratory: No cyanosis, no use of accessory musculature Skin: No lesions in the area of chief complaint  Neurologic: Sensation intact distally  Psychiatric: Patient is competent for consent with normal mood and affect Lymphatic: No swelling obvious and reported other than the area involved in the exam below Extremities  Right LE: Extremity held in a fixed position.  ROM deferred due to known fracture.  Pain with log roll and axial load. Sensation is intact distally in the sural, saphenous, DP, SP, and plantar nerve distribution. 2+ DP pulse.  Toes are WWP.  Active motion intact in the TA/EHL/GS. Left LE: Sensation is intact distally in the sural, saphenous, DP, SP, and plantar nerve distribution. 2+ DP pulse.  Toes are WWP.  Active motion intact in the TA/EHL/GS. Tolerates gentle ROM of the hip.  No pain with axial loading.     Test Results Imaging DG CHEST PORT 1 VIEW Result Date: 11/03/2023 CLINICAL DATA:  Preop right hip fracture EXAM: PORTABLE CHEST 1 VIEW COMPARISON:  CT chest dated 08/22/2023 FINDINGS: Lungs are clear.  No pleural effusion or pneumothorax. Heart is normal in size.  Prosthetic aortic valve. Median sternotomy. IMPRESSION: No acute cardiopulmonary disease. Electronically Signed   By: Charline Bills M.D.   On: 11/03/2023 02:16   DG Pelvis 1-2 Views Result Date: 11/03/2023 CLINICAL DATA:  Fall, right leg pain EXAM: PELVIS - 1-2 VIEW COMPARISON:  None Available. FINDINGS: Right proximal femoral shaft fracture, better evaluated on dedicated femur radiographs.  Additionally, there is a suspected nondisplaced intertrochanteric right hip fracture with a displaced lesser trochanter fragment. Bilateral hip joint spaces are preserved. Visualized bony pelvis appears intact. IMPRESSION: Suspected  nondisplaced intertrochanteric right hip fracture, as above. Additional right proximal femoral shaft fracture, better evaluated on dedicated femur radiographs. Electronically Signed   By: Charline Bills M.D.   On: 11/03/2023 01:31   DG Femur Min 2 Views Right Result Date: 11/03/2023 CLINICAL DATA:  Right leg pain EXAM: RIGHT FEMUR 2 VIEWS COMPARISON:  None Available. FINDINGS: Displaced oblique proximal femoral shaft fracture. IMPRESSION: Displaced oblique proximal femoral shaft fracture. Electronically Signed   By: Charline Bills M.D.   On: 11/03/2023 01:30   Labs cbc Recent Labs    11/03/23 0107 11/03/23 0357  WBC 7.8 15.7*  HGB 16.9 16.6  HCT 47.8 46.9  PLT 183 199    Labs inflam No results for input(s): "CRP" in the last 72 hours.  Invalid input(s): "ESR"  Labs coag No results for input(s): "INR", "PTT" in the last 72 hours.  Invalid input(s): "PT"  Recent Labs    11/03/23 0107 11/03/23 0357  NA 139 138  K 4.2 4.2  CL 105 104  CO2 21* 22  GLUCOSE 113* 133*  BUN 13 13  CREATININE 1.02 0.92  CALCIUM 9.4 9.1

## 2023-11-03 NOTE — Anesthesia Postprocedure Evaluation (Signed)
Anesthesia Post Note  Patient: Jermane Brayboy  Procedure(s) Performed: INTRAMEDULLARY (IM) NAIL FEMORAL (Right: Hip)     Patient location during evaluation: PACU Anesthesia Type: General Level of consciousness: awake Pain management: pain level controlled Vital Signs Assessment: post-procedure vital signs reviewed and stable Respiratory status: spontaneous breathing, nonlabored ventilation and respiratory function stable Cardiovascular status: blood pressure returned to baseline and stable Postop Assessment: no apparent nausea or vomiting Anesthetic complications: no   No notable events documented.  Last Vitals:  Vitals:   11/03/23 1500 11/03/23 1515  BP: 127/78 125/78  Pulse: 83 80  Resp: 13 10  Temp:  36.5 C  SpO2: 96% 96%    Last Pain:  Vitals:   11/03/23 1515  TempSrc:   PainSc: 0-No pain                 Linton Rump

## 2023-11-03 NOTE — ED Provider Notes (Signed)
Emergency Department Provider Note   I have reviewed the triage vital signs and the nursing notes.   HISTORY  Chief Complaint Hip Injury and Fall   HPI [name] Brown is a 73 y.o. male with past history reviewed below presents to the emergency department for evaluation of right thigh/hip pain after mechanical fall at home.  Patient states there is a ramp leading into his garage and he fell off the side landing on the right hip.  He had severe pain and was unable to walk.  No numbness.  Denies head trauma or loss of consciousness.  No presyncope symptoms prior to fall.  EMS arrived and applied a tired sheet/binder for comfort.    Past Medical History:  Diagnosis Date   Aortic stenosis    Ascending aortic aneurysm (HCC) 09/18/2018   Colon cancer (HCC)    Family history of adverse reaction to anesthesia    pt states his mother had 2 episodes of hyponatremia following anesthesia    GERD (gastroesophageal reflux disease)    Headache    Heart murmur    Hyperlipidemia 10/25/2016   Hypertension    pt is currently not taking any medications; pt states is borderline   Melanoma (HCC)    OSA on CPAP 09/18/2018   PAF (paroxysmal atrial fibrillation) (HCC) 06/13/2017   Perforated sigmoid colon (HCC)    PVC's (premature ventricular contractions) 10/25/2016   Tinnitus    Wears glasses     Review of Systems  Constitutional: No fever/chills Cardiovascular: Denies chest pain. Respiratory: Denies shortness of breath. Gastrointestinal: No abdominal pain.  No nausea, no vomiting.   Musculoskeletal: Positive right hip/thigh pain.  Skin: Negative for rash. Neurological: Negative for headaches, focal weakness or numbness.  ____________________________________________   PHYSICAL EXAM:  VITAL SIGNS: ED Triage Vitals  Encounter Vitals Group     BP 11/03/23 0044 (!) 168/101     Pulse Rate 11/03/23 0044 79     Resp 11/03/23 0044 19     Temp 11/03/23 0044 98 F (36.7 C)     Temp src --       SpO2 11/03/23 0044 94 %     Weight 11/03/23 0042 183 lb (83 kg)     Height 11/03/23 0042 5\' 6"  (1.676 m)   Constitutional: Alert and oriented. Well appearing and in no acute distress. Eyes: Conjunctivae are normal. Head: Atraumatic. Nose: No congestion/rhinnorhea. Mouth/Throat: Mucous membranes are moist.   Neck: No stridor.  Cardiovascular: Normal rate, regular rhythm. Good peripheral circulation. Grossly normal heart sounds.   Respiratory: Normal respiratory effort.  No retractions. Lungs CTAB. Gastrointestinal: Soft and nontender. No distention.  Musculoskeletal: Right hip flexed and the right knee at 90 degrees.  Patient with focal swelling to the mid thigh.  No bruising or laceration noted. Will not tolerate any ROM of the right hip. No tenderness to the right hip or ankle. Compartments are soft.  Neurologic:  Normal speech and language. No gross focal neurologic deficits are appreciated.  Skin:  Skin is warm, dry and intact. No rash noted.  ____________________________________________   LABS (all labs ordered are listed, but only abnormal results are displayed)  Labs Reviewed  COMPREHENSIVE METABOLIC PANEL - Abnormal; Notable for the following components:      Result Value   CO2 21 (*)    Glucose, Bld 113 (*)    All other components within normal limits  CBC WITH DIFFERENTIAL/PLATELET  CBC  BASIC METABOLIC PANEL   ____________________________________________  EKG  EKG Interpretation Date/Time:  Friday November 03 2023 02:17:29 EST Ventricular Rate:  80 PR Interval:  203 QRS Duration:  105 QT Interval:  407 QTC Calculation: 470 R Axis:   31  Text Interpretation: Sinus rhythm Ventricular premature complex Probable left atrial enlargement Anterior infarct, old Confirmed by Alona Bene [id number]) on 11/03/2023 3:23:56 AM        ____________________________________________  RADIOLOGY  DG CHEST PORT 1 VIEW Result Date: 11/03/2023 CLINICAL DATA:  Preop right hip  fracture EXAM: PORTABLE CHEST 1 VIEW COMPARISON:  CT chest dated 08/22/2023 FINDINGS: Lungs are clear.  No pleural effusion or pneumothorax. Heart is normal in size.  Prosthetic aortic valve. Median sternotomy. IMPRESSION: No acute cardiopulmonary disease. Electronically Signed   By: Charline Bills M.D.   On: 11/03/2023 02:16   DG Pelvis 1-2 Views Result Date: 11/03/2023 CLINICAL DATA:  Fall, right leg pain EXAM: PELVIS - 1-2 VIEW COMPARISON:  None Available. FINDINGS: Right proximal femoral shaft fracture, better evaluated on dedicated femur radiographs. Additionally, there is a suspected nondisplaced intertrochanteric right hip fracture with a displaced lesser trochanter fragment. Bilateral hip joint spaces are preserved. Visualized bony pelvis appears intact. IMPRESSION: Suspected nondisplaced intertrochanteric right hip fracture, as above. Additional right proximal femoral shaft fracture, better evaluated on dedicated femur radiographs. Electronically Signed   By: Charline Bills M.D.   On: 11/03/2023 01:31   DG Femur Min 2 Views Right Result Date: 11/03/2023 CLINICAL DATA:  Right leg pain EXAM: RIGHT FEMUR 2 VIEWS COMPARISON:  None Available. FINDINGS: Displaced oblique proximal femoral shaft fracture. IMPRESSION: Displaced oblique proximal femoral shaft fracture. Electronically Signed   By: Charline Bills M.D.   On: 11/03/2023 01:30    ____________________________________________   PROCEDURES  Procedure(s) performed:   Procedures  None  ____________________________________________   INITIAL IMPRESSION / ASSESSMENT AND PLAN / ED COURSE  Pertinent labs & imaging results that were available during my care of the patient were reviewed by me and considered in my medical decision making (see chart for details).   This patient is Presenting for Evaluation of hip pain, which does require a range of treatment options, and is a complaint that involves a high risk of morbidity and  mortality.  The Differential Diagnoses include fracture, dislocation, contusion, sprain, etc.  Critical Interventions-    Medications  diltiazem (CARDIZEM CD) 24 hr capsule 120 mg (has no administration in time range)  losartan (COZAAR) tablet 100 mg (has no administration in time range)  pravastatin (PRAVACHOL) tablet 20 mg (has no administration in time range)  levothyroxine (SYNTHROID) tablet 100 mcg (has no administration in time range)  rOPINIRole (REQUIP) tablet 0.75 mg (has no administration in time range)  oxyCODONE (Oxy IR/ROXICODONE) immediate release tablet 5 mg (has no administration in time range)  morphine (PF) 2 MG/ML injection 1-3 mg (3 mg Intravenous Given 11/03/23 0220)  senna-docusate (Senokot-S) tablet 1 tablet (has no administration in time range)  bisacodyl (DULCOLAX) EC tablet 5 mg (has no administration in time range)  acetaminophen (TYLENOL) tablet 650 mg (has no administration in time range)  prochlorperazine (COMPAZINE) injection 5 mg (has no administration in time range)  HYDROmorphone (DILAUDID) injection 1 mg (1 mg Intravenous Given 11/03/23 0059)  ondansetron (ZOFRAN) injection 4 mg (4 mg Intravenous Given 11/03/23 0059)    Reassessment after intervention: pain improved.    I did obtain Additional Historical Information from family at bedside.   Clinical Laboratory Tests Ordered, included CBC without anemia or leukocytosis.  Radiologic Tests Ordered, included  Femur XR and Hip XR. I independently interpreted the images and agree with radiology interpretation.   Cardiac Monitor Tracing which shows NSR.   Social Determinants of Health Risk patient is a non-smoker.   Consult complete with Ortho, Dr. Hulda Humphrey. No traction. They will consult in the AM for surgery. Will admit to [facility] given and and medical co-morbidities.   TRH. Dr. Antionette Char. Plan for admit.   Medical Decision Making: Summary:  Patient presents emergency department for evaluation after mechanical  fall.  He has thigh swelling and pain concerning for fracture.  The right lower extremity is neurovascularly intact on my initial assessment.  No evidence of open fracture.  Compartments are soft.  Reevaluation with update and discussion with patient and family.  Discussed x-ray findings.  Plan will be for admission and operative repair of his femur fracture. They are in agreement.   Patient's presentation is most consistent with acute presentation with potential threat to life or bodily function.   Disposition: admit  ____________________________________________  FINAL CLINICAL IMPRESSION(S) / ED DIAGNOSES  Final diagnoses:  Closed fracture of right femur, unspecified fracture morphology, unspecified portion of femur, initial encounter [facility])   Note:  This document was prepared using Dragon voice recognition software and may include unintentional dictation errors.  Alona Bene, MD, [facility] Emergency Medicine    [place], Arlyss Repress, MD 11/03/23 [phone]

## 2023-11-03 NOTE — Hospital Course (Signed)
73 y.o. male with medical history significant for hypertension, hyperlipidemia, hypothyroidism, nonischemic cardiomyopathy with recovered EF, nonobstructive CAD, aortic stenosis status post bioprosthetic valve replacement, and colon cancer status post right colectomy who presents with right hip pain after a mechanical fall at home.    Patient was in his usual state of health when he was walking to his garage in the dark with his hands full, mistakenly stepped off the side of a ramp, and fell onto his right hip.  He denies hitting his head or losing consciousness but was experiencing immediate and severe pain in the right hip.     He was having an uneventful day prior to this and denies any recent illness.  He has not been exercising as much recently but remains active, never experiences angina, and states that he could ascend 2 flights of stairs prior to the fall.

## 2023-11-03 NOTE — Plan of Care (Signed)

## 2023-11-03 NOTE — H&P (Signed)
History and Physical    Taquan Bralley ZOX:[id number] DOB: [birth year] DOA: 11/03/2023  PCP: Sheliah Hatch, MD   Patient coming from: Home   Chief Complaint: Fall, right hip pain   HPI: Eluterio Seymour is a 73 y.o. male with medical history significant for hypertension, hyperlipidemia, hypothyroidism, nonischemic cardiomyopathy with recovered EF, nonobstructive CAD, aortic stenosis status post bioprosthetic valve replacement, and colon cancer status post right colectomy who presents with right hip pain after a mechanical fall at home.   Patient was in his usual state of health when he was walking to his garage in the dark with his hands full, mistakenly stepped off the side of a ramp, and fell onto his right hip.  He denies hitting his head or losing consciousness but was experiencing immediate and severe pain in the right hip.    He was having an uneventful day prior to this and denies any recent illness.  He has not been exercising as much recently but remains active, never experiences angina, and states that he could ascend 2 flights of stairs prior to the fall.  ED Course: Upon arrival to the ED, patient is found to be afebrile and saturating well on room air with normal heart rate and elevated blood pressure.  Labs are most notable for normal creatinine, normal WBC, and normal hemoglobin.  Plain radiographs of the right femur and pelvis demonstrates suspected nondisplaced intertrochanteric right hip fracture and right proximal femoral shaft fracture.  Orthopedic surgery was consulted by the ED physician and the patient was treated with Dilaudid and Zofran.  Review of Systems:  All other systems reviewed and apart from HPI, are negative.  Past Medical History:  Diagnosis Date   Aortic stenosis    Ascending aortic aneurysm (HCC) 09/18/2018   Colon cancer (HCC)    Family history of adverse reaction to anesthesia    pt states his mother had 2 episodes of hyponatremia following anesthesia     GERD (gastroesophageal reflux disease)    Headache    Heart murmur    Hyperlipidemia 10/25/2016   Hypertension    pt is currently not taking any medications; pt states is borderline   Melanoma (HCC)    OSA on CPAP 09/18/2018   PAF (paroxysmal atrial fibrillation) (HCC) 06/13/2017   Perforated sigmoid colon (HCC)    PVC's (premature ventricular contractions) 10/25/2016   Tinnitus    Wears glasses     Past Surgical History:  Procedure Laterality Date   AORTIC VALVE REPLACEMENT N/A 06/08/2017   Procedure: AORTIC VALVE REPLACEMENT (AVR);  Surgeon: Alleen Borne, MD;  Location: [facility] OR;  Service: Open Heart Surgery;  Laterality: N/A;  Using 25mm Perimount Magna Ease Pericardial Bioprosthesis Aortic Valve   COLOSTOMY CLOSURE N/A 08/18/2016   Procedure: COLOSTOMY CLOSURE OPEN PROCEDURE;  Surgeon: Darnell Level, MD;  Location: [facility] ORS;  Service: General;  Laterality: N/A;   LAPAROTOMY N/A 04/25/2016   Procedure: EXPLORATORY LAPAROTOMY WITH SIGMOID COLECTOMY, COLOSTOMY;  Surgeon: Darnell Level, MD;  Location: [facility] ORS;  Service: General;  Laterality: N/A;   LEFT HEART CATH AND CORONARY ANGIOGRAPHY N/A 11/03/2016   Procedure: Left Heart Cath and Coronary Angiography;  Surgeon: Kathleene Hazel, MD;  Location: [facility] INVASIVE CV LAB;  Service: Cardiovascular;  Laterality: N/A;   MELANOMA EXCISION     PARTIAL COLECTOMY Right 08/18/2016   Procedure: RIGHT COLECTOMY;  Surgeon: Darnell Level, MD;  Location: [facility] ORS;  Service: General;  Laterality: Right;   TEE WITHOUT CARDIOVERSION N/A 06/08/2017  Procedure: TRANSESOPHAGEAL ECHOCARDIOGRAM (TEE);  Surgeon: Alleen Borne, MD;  Location: [facility] OR;  Service: Open Heart Surgery;  Laterality: N/A;    Social History:   reports that he has never smoked. He has never used smokeless tobacco. He reports current alcohol use of about 21.0 - 24.0 standard drinks of alcohol per week. He reports that he does not use drugs.  No Known Allergies  Family History  Problem  Relation Age of Onset   Dementia Mother    Kidney disease Mother    Vascular Disease Mother    CAD Mother    Heart attack Father    Alcohol abuse Father    Cirrhosis Father    Stroke Maternal Grandmother    Stroke Paternal Grandmother      Prior to Admission medications   Medication Sig Start Date End Date Taking? Authorizing Provider  aspirin EC 81 MG tablet Take 81 mg by mouth daily.    [provider]  diltiazem (CARDIZEM CD) 120 MG 24 hr capsule TAKE 1 CAPSULE BY MOUTH DAILY 10/09/23   Chilton Si, MD  doxycycline (VIBRAMYCIN) 50 MG capsule daily. 05/30/23   [provider]  ezetimibe (ZETIA) 10 MG tablet TAKE 1 TABLET BY MOUTH DAILY 07/05/23   Chilton Si, MD  famotidine (PEPCID) 20 MG tablet Take 20 mg by mouth daily as needed for heartburn or indigestion.    [provider]  FLUoxetine (PROZAC) 10 MG capsule Take 1 capsule (10 mg total) by mouth daily. 11/02/23   Sheliah Hatch, MD  ketoconazole (NIZORAL) 2 % cream  09/21/23   [provider]  levothyroxine (SYNTHROID) 100 MCG tablet Take 1 tablet (100 mcg total) by mouth daily. 02/07/23   Sheliah Hatch, MD  losartan (COZAAR) 100 MG tablet TAKE 1 TABLET BY MOUTH DAILY 11/02/22   Chilton Si, MD  pravastatin (PRAVACHOL) 20 MG tablet Take 1 tablet (20 mg total) by mouth daily. 11/02/22   Chilton Si, MD  rOPINIRole (REQUIP) 0.25 MG tablet Take 3 tablets (0.75 mg total) by mouth at bedtime. 11/01/22   Lomax, Amy, NP  tadalafil (CIALIS) 20 MG tablet Take 20 mg by mouth daily as needed for erectile dysfunction.    [provider]  XIIDRA 5 % SOLN  09/25/23   [provider]    Physical Exam: Vitals:   11/03/23 0042 11/03/23 0044  BP:  (!) 168/101  Pulse:  79  Resp:  19  Temp:  98 F (36.7 C)  SpO2:  94%  Weight: 83 kg   Height: 5\' 6"  (1.676 m)     Constitutional: NAD, calm  Eyes: PERTLA, lids and conjunctivae normal ENMT: Mucous membranes are  moist. Posterior pharynx clear of any exudate or lesions.   Neck: supple, no masses  Respiratory: no wheezing, no crackles. No accessory muscle use.  Cardiovascular: S1 & S2 heard, regular rate and rhythm. No JVD. Abdomen: No distension, no tenderness, soft. Bowel sounds active.  Musculoskeletal: no clubbing / cyanosis. Right hip tender, neurovascularly intact.   Skin: no significant rashes, lesions, ulcers. Warm, dry, well-perfused. Neurologic: CN 2-12 grossly intact. Moving all extremities. Alert and oriented.  Psychiatric: Pleasant. Cooperative.    Labs and Imaging on Admission: I have personally reviewed following labs and imaging studies  CBC: Recent Labs  Lab 11/03/23 0107  WBC 7.8  NEUTROABS 5.5  HGB 16.9  HCT 47.8  MCV 93.0  PLT 183   Basic Metabolic Panel: Recent Labs  Lab 11/03/23 0107  NA 139  K 4.2  CL 105  CO2 21*  GLUCOSE 113*  BUN 13  CREATININE 1.02  CALCIUM 9.4   GFR: Estimated Creatinine Clearance: 65.2 mL/min (by C-G formula based on SCr of 1.02 mg/dL). Liver Function Tests: Recent Labs  Lab 11/03/23 0107  AST 31  ALT 44  ALKPHOS 63  BILITOT 0.7  PROT 6.6  ALBUMIN 4.1   No results for input(s): "LIPASE", "AMYLASE" in the last 168 hours. No results for input(s): "AMMONIA" in the last 168 hours. Coagulation Profile: No results for input(s): "INR", "PROTIME" in the last 168 hours. Cardiac Enzymes: No results for input(s): "CKTOTAL", "CKMB", "CKMBINDEX", "TROPONINI" in the last 168 hours. BNP (last 3 results) No results for input(s): "PROBNP" in the last 8760 hours. HbA1C: No results for input(s): "HGBA1C" in the last 72 hours. CBG: No results for input(s): "GLUCAP" in the last 168 hours. Lipid Profile: No results for input(s): "CHOL", "HDL", "LDLCALC", "TRIG", "CHOLHDL", "LDLDIRECT" in the last 72 hours. Thyroid Function Tests: No results for input(s): "TSH", "T4TOTAL", "FREET4", "T3FREE", "THYROIDAB" in the last 72 hours. Anemia  Panel: No results for input(s): "VITAMINB12", "FOLATE", "FERRITIN", "TIBC", "IRON", "RETICCTPCT" in the last 72 hours. Urine analysis:    Component Value Date/Time   COLORURINE YELLOW 01/04/2021 0533   APPEARANCEUR CLEAR 01/04/2021 0533   LABSPEC 1.028 01/04/2021 0533   PHURINE 6.0 01/04/2021 0533   GLUCOSEU NEGATIVE 01/04/2021 0533   HGBUR NEGATIVE 01/04/2021 0533   BILIRUBINUR NEGATIVE 01/04/2021 0533   KETONESUR 15 (A) 01/04/2021 0533   PROTEINUR TRACE (A) 01/04/2021 0533   NITRITE NEGATIVE 01/04/2021 0533   LEUKOCYTESUR NEGATIVE 01/04/2021 0533   Sepsis Labs: @LABRCNTIP (procalcitonin:4,lacticidven:4) )No results found for this or any previous visit (from the past 240 hours).   Radiological Exams on Admission: DG Pelvis 1-2 Views Result Date: 11/03/2023 CLINICAL DATA:  Fall, right leg pain EXAM: PELVIS - 1-2 VIEW COMPARISON:  None Available. FINDINGS: Right proximal femoral shaft fracture, better evaluated on dedicated femur radiographs. Additionally, there is a suspected nondisplaced intertrochanteric right hip fracture with a displaced lesser trochanter fragment. Bilateral hip joint spaces are preserved. Visualized bony pelvis appears intact. IMPRESSION: Suspected nondisplaced intertrochanteric right hip fracture, as above. Additional right proximal femoral shaft fracture, better evaluated on dedicated femur radiographs. Electronically Signed   By: Charline Bills M.D.   On: 11/03/2023 01:31   DG Femur Min 2 Views Right Result Date: 11/03/2023 CLINICAL DATA:  Right leg pain EXAM: RIGHT FEMUR 2 VIEWS COMPARISON:  None Available. FINDINGS: Displaced oblique proximal femoral shaft fracture. IMPRESSION: Displaced oblique proximal femoral shaft fracture. Electronically Signed   By: Charline Bills M.D.   On: 11/03/2023 01:30    EKG: Independently reviewed. Sinus rhythm, PVC.   Assessment/Plan  1. Proximal right femur fracture  - Based on the available data, Mr. Wager presents  an estimated 0.7% risk of perioperative MI or cardiac arrest; he can achieve >4 METs and does not require further preoperative cardiac evaluation  - Keep NPO, hold pharmacologic VTE ppx, continue pain-control and supportive care    2. CAD  - Hx of non-obstructive CAD  - No angina  - Hold ASA, continue statin   3. NICM  - EF was 50-55% on TTE from November 2024    - Appears euvolemic   4. Hypertension  - Diltiazem, losartan    5. Hypothyroidism  - Synthroid     DVT prophylaxis: SCDs  Code Status: Full  Level of Care: Level of care: Med-Surg Family  Communication: Wife at bedside   Disposition Plan:  Patient is from: Home  Anticipated d/c is to: TBD Anticipated d/c date is: 11/07/23  Patient currently: Pending orthopedic surgery consultation and likely operative femur repair  Consults called: orthopedic surgery  Admission status: Inpatient     Briscoe Deutscher, MD [facility]  11/03/2023, 2:11 AM

## 2023-11-03 NOTE — ED Triage Notes (Addendum)
Pt BIB [facility] from home. Pt was missed a step outside of his house and fell. Pt endorses having 1 drink. No blood thinners, no LOC. Upon EMS arrival pt in a pelvic binder. Per EMS CNS intact in both legs. Hx HTN.   150 mcg Fent EMS 172/98 BP 76P  98% 150 mcg Fentanyl 22 L Hand

## 2023-11-03 NOTE — Progress Notes (Signed)
  Progress Note   Patient: [name] Brown ZOX:[id number] DOB: 19-Nov-1950 DOA: 11/03/2023     0 DOS: the patient was seen and examined on 11/03/2023   Brief hospital course: 73 y.o. male with medical history significant for hypertension, hyperlipidemia, hypothyroidism, nonischemic cardiomyopathy with recovered EF, nonobstructive CAD, aortic stenosis status post bioprosthetic valve replacement, and colon cancer status post right colectomy who presents with right hip pain after a mechanical fall at home.    Patient was in his usual state of health when he was walking to his garage in the dark with his hands full, mistakenly stepped off the side of a ramp, and fell onto his right hip.  He denies hitting his head or losing consciousness but was experiencing immediate and severe pain in the right hip.     He was having an uneventful day prior to this and denies any recent illness.  He has not been exercising as much recently but remains active, never experiences angina, and states that he could ascend 2 flights of stairs prior to the fall.  Assessment and Plan:  1. Proximal right femur fracture  - Orthopedic surgery was consulted -Pt underwent surgery 2/21 -F/u on PT recs   2. CAD  - Hx of non-obstructive CAD  - No angina  - Held ASA, continue statin    3. NICM  - EF was 50-55% on TTE from November 2024    - Appears euvolemic    4. Hypertension  - Diltiazem, losartan     5. Hypothyroidism  - Synthroid     Subjective: complained of uncontrolled hip pain this AM  Physical Exam: Vitals:   11/03/23 1445 11/03/23 1500 11/03/23 1515 11/03/23 1533  BP: 129/70 127/78 125/78 (!) 140/82  Pulse: 84 83 80 91  Resp: 12 13 10 16   Temp:   97.7 F (36.5 C) 98.3 F (36.8 C)  TempSrc:    Oral  SpO2: 96% 96% 96% 97%  Weight:      Height:       General exam: Awake, laying in bed, in nad Respiratory system: Normal respiratory effort, no wheezing Cardiovascular system: regular rate, s1,  s2 Gastrointestinal system: Soft, nondistended, positive BS Central nervous system: CN2-12 grossly intact, strength intact Extremities: Perfused, no clubbing Skin: Normal skin turgor, no notable skin lesions seen Psychiatry: Mood normal // no visual hallucinations   Data Reviewed:  Labs reviewed: Na 138, K 4.2, Cr 0.92, WBC 15.7  Family Communication: Pt in room, family not at bedside  Disposition: Status is: Inpatient Remains inpatient appropriate because: severity of illness  Planned Discharge Destination:  Pending PT eval     Author: Rickey Barbara, MD 11/03/2023 5:47 PM  For on call review [email or web address].

## 2023-11-03 NOTE — Transfer of Care (Signed)
Immediate Anesthesia Transfer of Care Note  Patient: Crayton Savarese  Procedure(s) Performed: INTRAMEDULLARY (IM) NAIL FEMORAL (Right: Hip)  Patient Location: PACU  Anesthesia Type:General  Level of Consciousness: sedated  Airway & Oxygen Therapy: Patient Spontanous Breathing  Post-op Assessment: Report given to RN  Post vital signs: Reviewed and stable  Last Vitals:  Vitals Value Taken Time  BP 108/52 11/03/23 1415  Temp    Pulse 78 11/03/23 1417  Resp 20 11/03/23 1417  SpO2 96 % 11/03/23 1417  Vitals shown include unfiled device data.  Last Pain:  Vitals:   11/03/23 1038  TempSrc:   PainSc: 8          Complications: No notable events documented.

## 2023-11-03 NOTE — Consult Note (Addendum)
Brief Consult note-full consult note to follow  73 year old male with PMH aortic stenosis, HLD, HTN, hypothyroidism who had mechanical fall. Imaging showed a right subtrochanteric femur fracture  Orthopaedics consulted for right subtrochanteric femur fracture   Plan for right femur cephalomedullary nail pending medical clearance and optimization. Plan for tomorrow Admission to medical service NPO Hold Dvt ppx Pain control

## 2023-11-04 DIAGNOSIS — S7291XA Unspecified fracture of right femur, initial encounter for closed fracture: Secondary | ICD-10-CM

## 2023-11-04 LAB — BASIC METABOLIC PANEL
Anion gap: 17 — ABNORMAL HIGH (ref 5–15)
BUN: 15 mg/dL (ref 8–23)
CO2: 25 mmol/L (ref 22–32)
Calcium: 9.2 mg/dL (ref 8.9–10.3)
Chloride: 97 mmol/L — ABNORMAL LOW (ref 98–111)
Creatinine, Ser: 1.42 mg/dL — ABNORMAL HIGH (ref 0.61–1.24)
GFR, Estimated: 52 mL/min — ABNORMAL LOW (ref >60)
Glucose, Bld: 165 mg/dL — ABNORMAL HIGH (ref 70–99)
Potassium: 4.5 mmol/L (ref 3.5–5.1)
Sodium: 139 mmol/L (ref 135–145)

## 2023-11-04 LAB — CBC
HCT: 34.9 % — ABNORMAL LOW (ref 39.0–52.0)
Hemoglobin: 12.4 g/dL — ABNORMAL LOW (ref 13.0–17.0)
MCH: 33.4 pg (ref 26.0–34.0)
MCHC: 35.5 g/dL (ref 30.0–36.0)
MCV: 94.1 fL (ref 80.0–100.0)
Platelets: 186 10*3/uL (ref 150–400)
RBC: 3.71 MIL/uL — ABNORMAL LOW (ref 4.22–5.81)
RDW: 12.8 % (ref 11.5–15.5)
WBC: 12.8 10*3/uL — ABNORMAL HIGH (ref 4.0–10.5)
nRBC: 0 % (ref 0.0–0.2)

## 2023-11-04 MED ORDER — THIAMINE MONONITRATE 100 MG PO TABS
100.0000 mg | ORAL_TABLET | Freq: Every day | ORAL | Status: DC
  Administered 2023-11-04 – 2023-11-07 (×4): 100 mg via ORAL
  Filled 2023-11-04 (×4): qty 1

## 2023-11-04 MED ORDER — FOLIC ACID 1 MG PO TABS
1.0000 mg | ORAL_TABLET | Freq: Every day | ORAL | Status: DC
  Administered 2023-11-04 – 2023-11-07 (×4): 1 mg via ORAL
  Filled 2023-11-04 (×4): qty 1

## 2023-11-04 MED ORDER — ADULT MULTIVITAMIN W/MINERALS CH
1.0000 | ORAL_TABLET | Freq: Every day | ORAL | Status: DC
  Administered 2023-11-04 – 2023-11-07 (×4): 1 via ORAL
  Filled 2023-11-04 (×4): qty 1

## 2023-11-04 MED ORDER — CALCIUM CARBONATE ANTACID 500 MG PO CHEW
1.0000 | CHEWABLE_TABLET | Freq: Three times a day (TID) | ORAL | Status: DC | PRN
  Administered 2023-11-04: 200 mg via ORAL
  Filled 2023-11-04: qty 1

## 2023-11-04 MED ORDER — ENSURE ENLIVE PO LIQD
237.0000 mL | Freq: Two times a day (BID) | ORAL | Status: DC
  Administered 2023-11-05 – 2023-11-07 (×5): 237 mL via ORAL

## 2023-11-04 MED ORDER — CYCLOBENZAPRINE HCL 5 MG PO TABS
5.0000 mg | ORAL_TABLET | Freq: Three times a day (TID) | ORAL | Status: DC | PRN
  Administered 2023-11-04 – 2023-11-07 (×6): 5 mg via ORAL
  Filled 2023-11-04 (×6): qty 1

## 2023-11-04 MED ORDER — FAMOTIDINE 20 MG PO TABS
20.0000 mg | ORAL_TABLET | Freq: Every day | ORAL | Status: DC
  Administered 2023-11-04 – 2023-11-07 (×4): 20 mg via ORAL
  Filled 2023-11-04 (×4): qty 1

## 2023-11-04 MED ORDER — LACTATED RINGERS IV SOLN: INTRAVENOUS | Status: AC

## 2023-11-04 NOTE — Progress Notes (Signed)
 Initial Nutrition Assessment  DOCUMENTATION CODES:   Not applicable  INTERVENTION:   Ensure Enlive po BID, each supplement provides 350 kcal and 20 grams of protein.  Add MVI with Minerals, Thiamine 100 mg and Folic Acid 1 mg daily for reported daily EtOH use  If po intake not adequate, recommend liberalizing diet to Regular  NUTRITION DIAGNOSIS:   Increased nutrient needs related to post-op healing as evidenced by estimated needs.  GOAL:   Patient will meet greater than or equal to 90% of their needs  MONITOR:   PO intake, Supplement acceptance, Skin, Weight trends  REASON FOR ASSESSMENT:   Consult Hip fracture protocol  ASSESSMENT:   73 yo male admitted s/p post fall with closed right hip fracture.  PMH includes aortic stenosis s/p valve replacement, CAD, colon cancer s/p right colectomy, HLD, HTN.   2/21 OR for R Cephalo medullary nail- R. hip  No recorded po intake.   Per chart review, pt drinks 2-3 glasses of wine daily. Plan to order MVI, thiamine and folic acid.  Current wt 81.6 kg; no weight loss trend per weight encounters.   Labs: BUN wdl, Creatinine 1.42 Meds: reviewed  Diet Order:   Diet Order             Diet Heart Room service appropriate? Yes; Fluid consistency: Thin  Diet effective now                   EDUCATION NEEDS:   Not appropriate for education at this time  Skin:  Skin Assessment: Skin Integrity Issues: Skin Integrity Issues:: Incisions Incisions: r. hip  Last BM:  2/20  Height:   Ht Readings from Last 1 Encounters:  11/03/23 5\' 6"  (1.676 m)    Weight:   Wt Readings from Last 1 Encounters:  11/03/23 81.6 kg     BMI:  Body mass index is 29.05 kg/m.  Estimated Nutritional Needs:   Kcal:  1850-2050 kcals  Protein:  90-100 g  Fluid:  >/= 1. L   Romelle Starcher MS, RDN, LDN, CNSC Registered Dietitian 3 Clinical Nutrition RD Inpatient Contact Info in Amion

## 2023-11-04 NOTE — Evaluation (Signed)
 evOccupational Therapy Evaluation Pat ient Details Name: [name] Brown MRN: [id number] DOB: [birth year] Today's Date: 11/04/2023   History of Present Illness   73 y.o. male presents to [facility] 11/03/23 after falling w/ nondisplaced intertrochanteric R hip fx and R proximal femoral shaft fx. S/p R hip IMN 2/21. PMHx: HTN, nonischemic cardiomyopathy w/ recovered EF, CAD, aortic stenosis s/p bioprosthetic valve replacement, colon cancer s/p R colectomy, PAF     Clinical Impressions Pt reports ind at baseline with ADL/functional mobility, lives with spouse who can assist at d/c. Pt currently needing up to mod A for ADLs, min A for bed mobility and min A for transfers with RW. Pt educated on WB precautions and is able to adhere to precautions well throughout session. Pt presenting with impairments listed below, will follow acutely. Recommend HHOT at d/c.      If plan is discharge home, recommend the following:   A little help with walking and/or transfers;A little help with bathing/dressing/bathroom;Assistance with cooking/housework;Direct supervision/assist for medications management;Direct supervision/assist for financial management;Assist for transportation;Help with stairs or ramp for entrance     Functional Status Assessment   Patient has had a recent decline in their functional status and demonstrates the ability to make significant improvements in function in a reasonable and predictable amount of time.     Equipment Recommendations   BSC/3in1;Other (comment);Wheelchair (measurements OT);Wheelchair cushion (measurements OT) (RW)     Recommendations for Other Services   PT consult     Precautions/Restrictions   Precautions Precautions: Fall Restrictions Weight Bearing Restrictions Per Provider Order: Yes RLE Weight Bearing Per Provider Order: Partial weight bearing RLE Partial Weight Bearing Percentage or Pounds: 25%     Mobility Bed Mobility Overal bed mobility: Needs  Assistance Bed Mobility: Supine to Sit     Supine to sit: Min assist, HOB elevated, Used rails     General bed mobility comments: MinA to guide R LE off EOB, MinA to shift R hips towards EOB once in sitting. Increased time and effort    Transfers Overall transfer level: Needs assistance Equipment used: Rolling walker (2 wheels) Transfers: Sit to/from Stand Sit to Stand: Min assist           General transfer comment: MinA to pull R LE into extension before sitting/standing, slight assistance for boost up. Cues for hand placement      Balance Overall balance assessment: Needs assistance, Mild deficits observed, not formally tested Sitting-balance support: Bilateral upper extremity supported, Feet supported Sitting balance-Leahy Scale: Fair Sitting balance - Comments: pt leans to the left to avoid pain in R hip Postural control: Left lateral lean Standing balance support: Bilateral upper extremity supported, During functional activity, Reliant on assistive device for balance Standing balance-Leahy Scale: Poor Standing balance comment: reliant on RW                           ADL either performed or assessed with clinical judgement   ADL Overall ADL's : Needs assistance/impaired Eating/Feeding: Set up   Grooming: Set up;Sitting   Upper Body Bathing: Minimal assistance;Sitting   Lower Body Bathing: Moderate assistance   Upper Body Dressing : Minimal assistance   Lower Body Dressing: Moderate assistance   Toilet Transfer: Minimal assistance;Ambulation;Regular Toilet;Rolling walker (2 wheels)   Toileting- Clothing Manipulation and Hygiene: Contact guard assist       Functional mobility during ADLs: Minimal assistance;Rolling walker (2 wheels)       Vision Baseline Vision/History: 1 Wears  glasses Vision Assessment?: No apparent visual deficits     Perception Perception: Not tested       Praxis Praxis: Not tested       Pertinent Vitals/Pain Pain  Assessment Pain Assessment: No/denies pain     Extremity/Trunk Assessment Upper Extremity Assessment Upper Extremity Assessment: Generalized weakness   Lower Extremity Assessment Lower Extremity Assessment: Defer to PT evaluation RLE Deficits / Details: alert to light touch, unable to SLR or hip ABD/ADD   Cervical / Trunk Assessment Cervical / Trunk Assessment: Normal   Communication Communication Communication: No apparent difficulties   Cognition Arousal: Alert Behavior During Therapy: WFL for tasks assessed/performed Cognition: No apparent impairments                               Following commands: Intact       Cueing  General Comments   Cueing Techniques: Verbal cues;Visual cues      Exercises     Shoulder Instructions      Home Living Family/patient expects to be discharged to:: Private residence Living Arrangements: Spouse/significant other Available Help at Discharge: Family;Available 24 hours/day Type of Home: House Home Access: Ramped entrance     Home Layout: Two level;Bed/bath upstairs (could be downstairs with 1/2 bath and  a twin bed in living room) Alternate Level Stairs-Number of Steps: flight   Bathroom Shower/Tub: Walk-in shower         Home Equipment: Control and instrumentation engineer (2 wheels) (can get 3n1)          Prior Functioning/Environment Prior Level of Function : Independent/Modified Independent             Mobility Comments: ind ADLs Comments: ind    OT Problem List: Decreased strength;Decreased range of motion;Impaired balance (sitting and/or standing);Decreased activity tolerance;Decreased safety awareness   OT Treatment/Interventions: Self-care/ADL training;Therapeutic exercise;Energy conservation;DME and/or AE instruction;Therapeutic activities;Balance training;Patient/family education      OT Goals(Current goals can be found in the care plan section)   Acute Rehab OT Goals Patient Stated  Goal: none stated OT Goal Formulation: With patient Time For Goal Achievement: 11/18/23 Potential to Achieve Goals: Good ADL Goals Pt Will Perform Upper Body Dressing: with modified independence;sitting Pt Will Perform Lower Body Dressing: with min assist;with adaptive equipment;sitting/lateral leans;sit to/from stand Pt Will Transfer to Toilet: Independently;ambulating;regular height toilet;bedside commode Pt Will Perform Tub/Shower Transfer: Tub transfer;Shower transfer;with supervision;ambulating   OT Frequency:  Min 1X/week    Co-evaluation   Reason for Co-Treatment: For patient/therapist safety;To address functional/ADL transfers PT goals addressed during session: Mobility/safety with mobility;Balance;Proper use of DME        AM-PAC OT "6 Clicks" Daily Activity     Outcome Measure Help from another person eating meals?: None Help from another person taking care of personal grooming?: A Little Help from another person toileting, which includes using toliet, bedpan, or urinal?: A Little Help from another person bathing (including washing, rinsing, drying)?: A Lot Help from another person to put on and taking off regular upper body clothing?: A Little Help from another person to put on and taking off regular lower body clothing?: A Lot 6 Click Score: 17   End of Session Equipment Utilized During Treatment: Gait belt;Rolling walker (2 wheels) Nurse Communication: Mobility status  Activity Tolerance: Patient tolerated treatment well Patient left: in chair;with call bell/phone within reach;with bed alarm set;with family/visitor present  OT Visit Diagnosis: Unsteadiness on feet (R26.81);Other abnormalities of gait and  mobility (R26.89);Muscle weakness (generalized) (M62.81)                Time: 1610-9604 OT Time Calculation (min): 21 min Charges:  OT General Charges $OT Visit: 1 Visit OT Evaluation $OT Eval Moderate Complexity: 1 Mod  Joelynn Dust K, OTD, OTR/L SecureChat  Preferred Acute Rehab [phone]   Carver Fila Koonce 11/04/2023, 12:59 PM

## 2023-11-04 NOTE — Progress Notes (Signed)
 MD, patient mentioned that his penis has been feeling numb ever since he had surgery on 11/03/23.  There are no visible swelling or injury noted on my assessment and he is voiding without any pain.  Just numbness.  He did not have a foley after surgery but I am not sure if he had an I/O during surgery.   [id number] Patient stated numbness in penis has improved "a little bit" this morning.

## 2023-11-04 NOTE — Evaluation (Signed)
 Physical Therapy Evaluation Patient Details Name: [name] Brown MRN: [id number] DOB: [birth year] Today's Date: 11/04/2023  History of Present Illness  73 y.o. male presents to [facility] 11/03/23 after falling w/ nondisplaced intertrochanteric R hip fx and R proximal femoral shaft fx. S/p R hip IMN 2/21. PMHx: HTN, nonischemic cardiomyopathy w/ recovered EF, CAD, aortic stenosis s/p bioprosthetic valve replacement, colon cancer s/p R colectomy, PAF   Clinical Impression  Pt in bed upon arrival with wife present and agreeable to PT eval. Pt presents with condition above and deficits mentioned below, see PT Problem List. PTA, pt was independent with no AD. In today's session, pt needed MinA to guide R LE off EOB and to shift hips. Pt leans to the left while in sitting due to pain in R hip. Pt was able to stand and ambulate ~20 ft with CGA and RW. Pt prefers to keep R LE NWB with demonstration provided for technique to adhere to WB precautions. Pt has 24/7 physical assist available and is able to live on the first floor if he has difficulty with stair negotiation. Pt would benefit from acute skilled PT to address functional impairments. Recommending post-acute HHPT to work towards independence with mobility. Acute PT to follow.           If plan is discharge home, recommend the following: A little help with walking and/or transfers;A little help with bathing/dressing/bathroom;Assistance with cooking/housework;Assist for transportation;Help with stairs or ramp for entrance   Can travel by private vehicle   Yes     Equipment Recommendations Wheelchair (measurements PT);Wheelchair cushion (measurements PT)     Functional Status Assessment Patient has had a recent decline in their functional status and demonstrates the ability to make significant improvements in function in a reasonable and predictable amount of time.     Precautions / Restrictions Precautions Precautions: Fall Restrictions Weight  Bearing Restrictions Per Provider Order: Yes RLE Weight Bearing Per Provider Order: Partial weight bearing RLE Partial Weight Bearing Percentage or Pounds: 25%      Mobility  Bed Mobility Overal bed mobility: Needs Assistance Bed Mobility: Supine to Sit    Supine to sit: Min assist, HOB elevated, Used rails    General bed mobility comments: MinA to guide R LE off EOB, MinA to shift R hips towards EOB once in sitting. Increased time and effort    Transfers Overall transfer level: Needs assistance Equipment used: Rolling walker (2 wheels) Transfers: Sit to/from Stand Sit to Stand: Min assist    General transfer comment: MinA to pull R LE into extension before sitting/standing, slight assistance for boost up. Cues for hand placement    Ambulation/Gait Ambulation/Gait assistance: Contact guard assist Gait Distance (Feet): 20 Feet Assistive device: Rolling walker (2 wheels) Gait Pattern/deviations: Step-to pattern (hop-to)    General Gait Details: CGA for safety, pt switches between keeping R LE NWB and using step to gait pattern to adhere to RLE PWB    Balance Overall balance assessment: Needs assistance, Mild deficits observed, not formally tested Sitting-balance support: Bilateral upper extremity supported, Feet supported Sitting balance-Leahy Scale: Fair Sitting balance - Comments: pt leans to the left to avoid pain in R hip Postural control: Left lateral lean Standing balance support: Bilateral upper extremity supported, During functional activity, Reliant on assistive device for balance Standing balance-Leahy Scale: Poor Standing balance comment: reliant on RW        Pertinent Vitals/Pain Pain Assessment Pain Assessment: No/denies pain    Home Living Family/patient expects to be discharged  to:: Private residence Living Arrangements: Spouse/significant other Available Help at Discharge: Family;Available 24 hours/day Type of Home: House Home Access: Ramped  entrance    Alternate Level Stairs-Number of Steps: flight Home Layout: Two level;Bed/bath upstairs (could be downstairs with 1/2 bath and  a twin bed in living room) Home Equipment: Control and instrumentation engineer (2 wheels) (can get 3n1)      Prior Function Prior Level of Function : Independent/Modified Independent  Mobility Comments: ind ADLs Comments: ind     Extremity/Trunk Assessment   Upper Extremity Assessment Upper Extremity Assessment: Defer to OT evaluation    Lower Extremity Assessment Lower Extremity Assessment: RLE deficits/detail RLE Deficits / Details: alert to light touch, unable to SLR or hip ABD/ADD    Cervical / Trunk Assessment Cervical / Trunk Assessment: Normal  Communication   Communication Communication: No apparent difficulties    Cognition Arousal: Alert Behavior During Therapy: WFL for tasks assessed/performed   PT - Cognitive impairments: No apparent impairments    Following commands: Intact       Cueing Cueing Techniques: Verbal cues, Visual cues     General Comments General comments (skin integrity, edema, etc.): VSS on RA, wife present and supportive throughout session     PT Assessment Patient needs continued PT services  PT Problem List Decreased strength;Decreased range of motion;Decreased activity tolerance;Decreased balance;Decreased mobility;Decreased coordination;Decreased knowledge of use of DME       PT Treatment Interventions DME instruction;Gait training;Stair training;Functional mobility training;Therapeutic activities;Therapeutic exercise;Balance training;Neuromuscular re-education;Patient/family education    PT Goals (Current goals can be found in the Care Plan section)  Acute Rehab PT Goals Patient Stated Goal: to go home PT Goal Formulation: With patient/family Time For Goal Achievement: 11/18/23 Potential to Achieve Goals: Good    Frequency Min 1X/week     Co-evaluation   Reason for Co-Treatment:  For patient/therapist safety;To address functional/ADL transfers PT goals addressed during session: Mobility/safety with mobility;Balance;Proper use of DME         AM-PAC PT "6 Clicks" Mobility  Outcome Measure Help needed turning from your back to your side while in a flat bed without using bedrails?: A Little Help needed moving from lying on your back to sitting on the side of a flat bed without using bedrails?: A Little Help needed moving to and from a bed to a chair (including a wheelchair)?: A Little Help needed standing up from a chair using your arms (e.g., wheelchair or bedside chair)?: A Little Help needed to walk in hospital room?: A Little Help needed climbing 3-5 steps with a railing? : A Lot 6 Click Score: 17    End of Session Equipment Utilized During Treatment: Gait belt Activity Tolerance: Patient tolerated treatment well Patient left: in chair;with call bell/phone within reach;with chair alarm set Nurse Communication: Mobility status;Patient requests pain meds PT Visit Diagnosis: Other abnormalities of gait and mobility (R26.89);Muscle weakness (generalized) (M62.81)    Time: 1610-9604 PT Time Calculation (min) (ACUTE ONLY): 21 min   Charges:   PT Evaluation $PT Eval Low Complexity: 1 Low   PT General Charges $$ ACUTE PT VISIT: 1 Visit       Hilton Cork, PT, DPT Secure Chat Preferred  Rehab Office [phone]   Arturo Morton Brion Aliment 11/04/2023, 10:15 AM

## 2023-11-04 NOTE — Progress Notes (Addendum)
.  Subjective: 1 Day Post-Op Procedure(s) (LRB): INTRAMEDULLARY (IM) NAIL FEMORAL (Right)  Patient seen and examined. Pain improved from prior to surgery. Seen working with PT.  Activity level:  PWB-25% for 2 weeks then can progress Diet tolerance:  as tolerated Patient reports pain as moderate.    Objective: Vital signs in last 24 hours: Temp:  [97.5 F (36.4 C)-99.4 F (37.4 C)] 97.5 F (36.4 C) (02/22 0417) Pulse Rate:  [80-105] 86 (02/22 0839) Resp:  [10-18] 18 (02/22 0839) BP: (97-151)/(37-91) 108/62 (02/22 0839) SpO2:  [92 %-99 %] 93 % (02/22 0839) Weight:  [81.6 kg] 81.6 kg (02/21 1036)  Labs: Recent Labs    11/02/23 1054 11/03/23 0107 11/03/23 0357 11/04/23 0431  HGB 17.1* 16.9 16.6 12.4*   Recent Labs    11/03/23 0357 11/04/23 0431  WBC 15.7* 12.8*  RBC 5.07 3.71*  HCT 46.9 34.9*  PLT 199 186   Recent Labs    11/03/23 0357 11/04/23 0431  NA 138 139  K 4.2 4.5  CL 104 97*  CO2 22 25  BUN 13 15  CREATININE 0.92 1.42*  GLUCOSE 133* 165*  CALCIUM 9.1 9.2   No results for input(s): "LABPT", "INR" in the last 72 hours.  Physical Exam:  Neurologically intact Neurovascular intact Sensation intact distally Intact pulses distally Dorsiflexion/Plantar flexion intact Incision: dressing C/D/I Compartment soft  Assessment/Plan:  1 Day Post-Op Procedure(s) (LRB): INTRAMEDULLARY (IM) NAIL FEMORAL (Right)  PWB 25% for 2 weeks then can progress to WBAT PT/OT DVT ppx: recommend lovenox daily until mobilizing then transition to aspirin 81mg  BID Ancef q8 for 24 hours for surgical ppx Pain control as needed Hgb 12.4 from 16.6. Due to acute blood loss anemia from surgery and fracture. Continue to trend CBC and transfuse as needed  Luci Bank 11/04/2023, 10:04 AM

## 2023-11-04 NOTE — Progress Notes (Signed)
  Progress Note   Patient: [name] Brown ZOX:[id number] DOB: March 12, 1951 DOA: 11/03/2023     1 DOS: the patient was seen and examined on 11/04/2023   Brief hospital course: 73 y.o. male with medical history significant for hypertension, hyperlipidemia, hypothyroidism, nonischemic cardiomyopathy with recovered EF, nonobstructive CAD, aortic stenosis status post bioprosthetic valve replacement, and colon cancer status post right colectomy who presents with right hip pain after a mechanical fall at home.    Patient was in his usual state of health when he was walking to his garage in the dark with his hands full, mistakenly stepped off the side of a ramp, and fell onto his right hip.  He denies hitting his head or losing consciousness but was experiencing immediate and severe pain in the right hip.     He was having an uneventful day prior to this and denies any recent illness.  He has not been exercising as much recently but remains active, never experiences angina, and states that he could ascend 2 flights of stairs prior to the fall.  Assessment and Plan:  1. Proximal right femur fracture  - Orthopedic surgery was consulted -Pt underwent surgery 2/21 -F/u on PT recs   2. CAD  - Hx of non-obstructive CAD  - No angina  - Held ASA, continue statin    3. NICM  - EF was 50-55% on TTE from November 2024    - Appears euvolemic    4. Hypertension  - Diltiazem, losartan     5. Hypothyroidism  - Cont synthroid    6. ARF - hold ARB - Encourage pt to hydrate -recheck bmet in AM   Subjective: Without complaints this AM  Physical Exam: Vitals:   11/03/23 2007 11/04/23 0417 11/04/23 0839 11/04/23 1509  BP: 125/83 (!) 105/58 108/62 (!) 109/54  Pulse: (!) 105 82 86 87  Resp: 18 18 18 14   Temp: 99.4 F (37.4 C) (!) 97.5 F (36.4 C)  98.3 F (36.8 C)  TempSrc:    Oral  SpO2: 95% 95% 93% 97%  Weight:      Height:       General exam: Conversant, in no acute distress Respiratory system:  normal chest rise, clear, no audible wheezing Cardiovascular system: regular rhythm, s1-s2 Gastrointestinal system: Nondistended, nontender, pos BS Central nervous system: No seizures, no tremors Extremities: No cyanosis, no joint deformities Skin: No rashes, no pallor Psychiatry: Affect normal // no auditory hallucinations   Data Reviewed:  Labs reviewed: Na 139, K 4.5, Cr 1.42  Family Communication: Pt in room, family not at bedside  Disposition: Status is: Inpatient Remains inpatient appropriate because: severity of illness  Planned Discharge Destination: Home with Home Health     Author: Rickey Barbara, MD 11/04/2023 4:06 PM  For on call review [email or web address].

## 2023-11-05 DIAGNOSIS — S7291XA Unspecified fracture of right femur, initial encounter for closed fracture: Secondary | ICD-10-CM | POA: Diagnosis not present

## 2023-11-05 LAB — CBC
HCT: 30.1 % — ABNORMAL LOW (ref 39.0–52.0)
Hemoglobin: 10.4 g/dL — ABNORMAL LOW (ref 13.0–17.0)
MCH: 33 pg (ref 26.0–34.0)
MCHC: 34.6 g/dL (ref 30.0–36.0)
MCV: 95.6 fL (ref 80.0–100.0)
Platelets: 122 10*3/uL — ABNORMAL LOW (ref 150–400)
RBC: 3.15 MIL/uL — ABNORMAL LOW (ref 4.22–5.81)
RDW: 13 % (ref 11.5–15.5)
WBC: 7.2 10*3/uL (ref 4.0–10.5)
nRBC: 0 % (ref 0.0–0.2)

## 2023-11-05 LAB — BASIC METABOLIC PANEL
Anion gap: 6 (ref 5–15)
BUN: 16 mg/dL (ref 8–23)
CO2: 28 mmol/L (ref 22–32)
Calcium: 8.5 mg/dL — ABNORMAL LOW (ref 8.9–10.3)
Chloride: 103 mmol/L (ref 98–111)
Creatinine, Ser: 1.21 mg/dL (ref 0.61–1.24)
GFR, Estimated: >60 mL/min (ref >60)
Glucose, Bld: 117 mg/dL — ABNORMAL HIGH (ref 70–99)
Potassium: 4.3 mmol/L (ref 3.5–5.1)
Sodium: 137 mmol/L (ref 135–145)

## 2023-11-05 NOTE — Progress Notes (Signed)
  Progress Note   Patient: [name] Brown ZOX:[id number] DOB: 10/06/1950 DOA: 11/03/2023     2 DOS: the patient was seen and examined on 11/05/2023   Brief hospital course: 73 y.o. male with medical history significant for hypertension, hyperlipidemia, hypothyroidism, nonischemic cardiomyopathy with recovered EF, nonobstructive CAD, aortic stenosis status post bioprosthetic valve replacement, and colon cancer status post right colectomy who presents with right hip pain after a mechanical fall at home.    Patient was in his usual state of health when he was walking to his garage in the dark with his hands full, mistakenly stepped off the side of a ramp, and fell onto his right hip.  He denies hitting his head or losing consciousness but was experiencing immediate and severe pain in the right hip.     He was having an uneventful day prior to this and denies any recent illness.  He has not been exercising as much recently but remains active, never experiences angina, and states that he could ascend 2 flights of stairs prior to the fall.  Assessment and Plan:  1. Proximal right femur fracture  - Orthopedic surgery was consulted -Pt underwent surgery 2/21 -PT/OT recs for HHPTOT -Ortho recs for lovenox daily until mobilizing then transition to ASA 81mg  bid   2. CAD  - Hx of non-obstructive CAD  - No angina  - Held ASA for now, continue statin  - eventually continue with asa when pt is mobilizing   3. NICM  - EF was 50-55% on TTE from November 2024    - Appears euvolemic    4. Hypertension  - Diltiazem, losartan     5. Hypothyroidism  - Cont synthroid    6. ARF - hold ARB - Encouraged pt to hydrate -Cr improved   Subjective: Without complaints  Physical Exam: Vitals:   11/04/23 2335 11/05/23 0518 11/05/23 0747 11/05/23 1435  BP: (!) 98/55 107/64 103/66 (!) 109/49  Pulse: 79 97 99 96  Resp:  16  18  Temp:  98.4 F (36.9 C) 98.3 F (36.8 C) 97.6 F (36.4 C)  TempSrc:  Oral   Oral  SpO2:  96% 92% 91%  Weight:      Height:       General exam: Awake, laying in bed, in nad Respiratory system: Normal respiratory effort, no wheezing Cardiovascular system: regular rate, s1, s2 Gastrointestinal system: Soft, nondistended, positive BS Central nervous system: CN2-12 grossly intact, strength intact Extremities: Perfused, no clubbing Skin: Normal skin turgor, no notable skin lesions seen Psychiatry: Mood normal // no visual hallucinations   Data Reviewed:  Labs reviewed: Na 137, K 4.3, Cr 1.21  Family Communication: Pt in room, family at bedside  Disposition: Status is: Inpatient Remains inpatient appropriate because: severity of illness  Planned Discharge Destination: Home with Home Health     Author: Rickey Barbara, MD 11/05/2023 2:57 PM  For on call review [email or web address].

## 2023-11-05 NOTE — Plan of Care (Signed)

## 2023-11-05 NOTE — Progress Notes (Signed)
 Pulled Dilaudid iv to give however when opening the medication from the packaging the medication spilled out on [employer] computer.  I had to waste at the pyxis with another nurse Darcel Bayley and pull out another Dilaudid iv for patient.

## 2023-11-05 NOTE — Op Note (Signed)
 .Orthopaedic Surgery Operative Note (CSN: [id number])  [name] Brown  1951/06/24 Date of Surgery: 11/03/2023   Diagnoses:  Right Subtrochanteric Femur Fracture  Procedure: Right femur cephalomedullary nailing   Operative Finding Successful completion of the planned procedure.  Long oblique subtrochanteric femur fracture with flexion deformity of proximal fragment  Post-operative plan: The patient will be PWB x25% for 2 weeks with progression to 50% for 2 weeks then WBAT.  The patient will be evaluated by PT and OT. DVT prophylaxis- Lovenox 40 mg/day until mobilizing and then consider transition in clinic to alternative medicines.   Pain control with PRN pain medication preferring oral medicines.  Follow up plan will be scheduled in approximately 14 days for incision check and XR right femur  Post-Op Diagnosis: Same Surgeons:Primary: Luci Bank, MD Assistants: None Location: [facility] OR ROOM 04 Anesthesia: General Antibiotics: Ancef 2 g Tourniquet time: N/A Estimated Blood Loss: 500 mL Complications: None Specimens: None Implants: Implant Name Type Inv. Item Serial No. Manufacturer Lot No. LRB No. Used Action  NAIL LOCK CANN 10X380 130D RT - [id number] Nail NAIL LOCK CANN 10X380 130D RT  [employer] [id number] Right 1 Implanted  SCREW LAG COMPR KIT 110/105 - [id number] Screw SCREW LAG COMPR KIT 110/105  [employer] [id number] Right 1 Implanted  SCREW TRIGEN LOW PROF 5.0X40 - [id number] Screw SCREW TRIGEN LOW PROF 5.0X40  [employer] [id number] Right 1 Implanted    Indications for Surgery:   [name] Brown is a 73 y.o. male with medical history significant for hypertension, hyperlipidemia, hypothyroidism, nonischemic cardiomyopathy with recovered EF, nonobstructive CAD, aortic stenosis status post bioprosthetic valve replacement, and colon cancer status post right colectomy who presented after a mechanical fall at home. He was found to have a  right subtrochanteric femur fracture and was indicated for a right hip cephalomedullary nailling  Benefits and risks of operative and nonoperative management were discussed prior to surgery with patient/guardian(s) and informed consent form was completed. These risks incluyde bt are not limted to infection, bleeding, damage to surrounding structures, malunion, nonunion, failure of hardware, promintent hardware, need for assistive device, prolonged rehabilitation, DVT, weakness, need for additional surgery, and cardiopulmonary complications from anesthesia. The patient understands these risks and wished to proceed with surgery.   Procedure:   The patient was identified properly. Informed consent was obtained and the surgical site was marked. The patient was taken up to suite where general anesthesia was induced.  The patient was placed supine on a fracture table and improved  reduction was obtained and visualized on fluoroscopy prior to the beginning of the procedure. The right leg was prepped and draped in the usual sterile fashion  The fracture site was localized under fluoroscopy. An incision was made over the lateral thigh at the level of the fracture site and the IT band was sharply incised. The vastus musculature was elevated off the linea aspera. Provisional reduction of the fracture was held with a combination of bone hooks and cobb elevator. We then made an incision proximal to the greater trochanter and dissected down through the fascia.  We then carefully placed our starting guidewire localizing under fluoroscopy prior to advancing the wire into the bone and using and entry reamer to open the femoral canal. A ball-tipped guidewire was passed into the femoral canal.  The wire was passed to an appropriate level past the isthmus into the distal femur.  We selected a length of nail noted above.  At this point we  placed our nail localizing under fluoroscopy that it was at the appropriate level prior to  using the outrigger device to pass a wire and then the cephalo-medullary screws.  The screws were locked proximally to avoid over collapse.  We took final shots at the proximal femur. A single distal interlock screw was placed using perfect circle technique  Final pictures were obtained.  The wounds were thoroughly irrigated closed in a multilayer fashion with absorable sutures and skin staples.  A sterile dressing was placed.  The patient was awoken from general anesthesia and taken to the PACU in stable condition without complication.

## 2023-11-05 NOTE — Progress Notes (Signed)
.  Subjective: 2 Days Post-Op Procedure(s) (LRB): INTRAMEDULLARY (IM) NAIL FEMORAL (Right)  Patient seen and examined. Pain improved from prior to surgery but says most pain is in his thigh with movement.   Activity level:  PWB-25% for 2 weeks then can progress to 50% for 2 weeks then WBAT Diet tolerance:  as tolerated Patient reports pain as moderate.    Objective: Vital signs in last 24 hours: Temp:  [98.3 F (36.8 C)-98.5 F (36.9 C)] 98.3 F (36.8 C) (02/23 0747) Pulse Rate:  [55-99] 99 (02/23 0747) Resp:  [14-16] 16 (02/23 0518) BP: (97-109)/(53-66) 103/66 (02/23 0747) SpO2:  [92 %-97 %] 92 % (02/23 0747)  Labs: Recent Labs    11/02/23 1054 11/03/23 0107 11/03/23 0357 11/04/23 0431 11/05/23 0611  HGB 17.1* 16.9 16.6 12.4* 10.4*   Recent Labs    11/04/23 0431 11/05/23 0611  WBC 12.8* 7.2  RBC 3.71* 3.15*  HCT 34.9* 30.1*  PLT 186 122*   Recent Labs    11/04/23 0431 11/05/23 0611  NA 139 137  K 4.5 4.3  CL 97* 103  CO2 25 28  BUN 15 16  CREATININE 1.42* 1.21  GLUCOSE 165* 117*  CALCIUM 9.2 8.5*   No results for input(s): "LABPT", "INR" in the last 72 hours.  Physical Exam:  Neurologically intact Neurovascular intact Sensation intact distally Intact pulses distally Dorsiflexion/Plantar flexion intact Incision: dressing C/D/I Compartment soft  Assessment/Plan:  2 Days Post-Op Procedure(s) (LRB): INTRAMEDULLARY (IM) NAIL FEMORAL (Right) PWB-25% for 2 weeks then can progress to 50% for 2 weeks then WBAT PT/OT DVT ppx: recommend lovenox daily until mobilizing then transition to aspirin 81mg  BID Ancef q8 for 24 hours for surgical ppx Pain control as needed Hgb 10.4 from 12.4. Due to acute blood loss anemia from surgery and fracture. Continue to trend CBC and transfuse as needed   Luci Bank 11/05/2023, 10:12 AM

## 2023-11-06 ENCOUNTER — Encounter (HOSPITAL_COMMUNITY): Payer: Self-pay | Admitting: Orthopedic Surgery

## 2023-11-06 ENCOUNTER — Encounter: Payer: Self-pay | Admitting: Family Medicine

## 2023-11-06 ENCOUNTER — Telehealth: Payer: Self-pay

## 2023-11-06 DIAGNOSIS — E039 Hypothyroidism, unspecified: Secondary | ICD-10-CM

## 2023-11-06 DIAGNOSIS — S7291XA Unspecified fracture of right femur, initial encounter for closed fracture: Secondary | ICD-10-CM | POA: Diagnosis not present

## 2023-11-06 LAB — BASIC METABOLIC PANEL
Anion gap: 6 (ref 5–15)
BUN: 15 mg/dL (ref 8–23)
CO2: 28 mmol/L (ref 22–32)
Calcium: 8.4 mg/dL — ABNORMAL LOW (ref 8.9–10.3)
Chloride: 103 mmol/L (ref 98–111)
Creatinine, Ser: 1.08 mg/dL (ref 0.61–1.24)
GFR, Estimated: >60 mL/min (ref >60)
Glucose, Bld: 112 mg/dL — ABNORMAL HIGH (ref 70–99)
Potassium: 4 mmol/L (ref 3.5–5.1)
Sodium: 137 mmol/L (ref 135–145)

## 2023-11-06 LAB — CBC
HCT: 30.9 % — ABNORMAL LOW (ref 39.0–52.0)
Hemoglobin: 10.7 g/dL — ABNORMAL LOW (ref 13.0–17.0)
MCH: 33.5 pg (ref 26.0–34.0)
MCHC: 34.6 g/dL (ref 30.0–36.0)
MCV: 96.9 fL (ref 80.0–100.0)
Platelets: 155 10*3/uL (ref 150–400)
RBC: 3.19 MIL/uL — ABNORMAL LOW (ref 4.22–5.81)
RDW: 12.9 % (ref 11.5–15.5)
WBC: 8.8 10*3/uL (ref 4.0–10.5)
nRBC: 0 % (ref 0.0–0.2)

## 2023-11-06 NOTE — Care Management Important Message (Signed)
 Important Message  Patient Details  Name: Aarian Griffie MRN: [id number] Date of Birth: Oct 01, 1950   Important Message Given:  Yes - Medicare IM     Sherilyn Banker 11/06/2023, 3:42 PM

## 2023-11-06 NOTE — Progress Notes (Signed)
 Occupational Therapy Treatment Patient Details Name: [name] Brown MRN: [id number] DOB: [birth year] Today's Date: 11/06/2023   History of present illness 73 y.o. male presents to [facility] 11/03/23 after falling w/ nondisplaced intertrochanteric R hip fx and R proximal femoral shaft fx. S/p R hip IMN 2/21. PMHx: HTN, nonischemic cardiomyopathy w/ recovered EF, CAD, aortic stenosis s/p bioprosthetic valve replacement, colon cancer s/p R colectomy, PAF   OT comments  Today pt was min assist for supine to sit to move RLE off of bed. Pt ambulated to toilet with RW and CGA. OT educated pt on AE for ADLs, pt demo understanding. Pt and wife expressed concerns of d/c home d/t stairs, and lack of assistance available if d/c home. Concerns were communicated to [facility], and updated on current function. OT continues to reccomend [facility] if pt can live on main level. Will continue to follow acutely.      If plan is discharge home, recommend the following:  A little help with walking and/or transfers;A little help with bathing/dressing/bathroom;Assistance with cooking/housework;Assist for transportation;Help with stairs or ramp for entrance   Equipment Recommendations  BSC/3in1;Tub/shower seat    Recommendations for Other Services PT consult    Precautions / Restrictions Precautions Precautions: Fall Restrictions Weight Bearing Restrictions Per Provider Order: Yes RLE Weight Bearing Per Provider Order: Partial weight bearing RLE Partial Weight Bearing Percentage or Pounds: 25%       Mobility Bed Mobility Overal bed mobility: Needs Assistance Bed Mobility: Supine to Sit     Supine to sit: Min assist, HOB elevated, Used rails     General bed mobility comments: MinA to guide R LE off EOB. Increased time and effort    Transfers Overall transfer level: Needs assistance Equipment used: Rolling walker (2 wheels)   Sit to Stand: Contact guard assist           General transfer comment: CGA ambulation to  bathroom     Balance Overall balance assessment: Mild deficits observed, not formally tested, Needs assistance Sitting-balance support: No upper extremity supported, Feet supported Sitting balance-Leahy Scale: Good     Standing balance support: Bilateral upper extremity supported, During functional activity, Reliant on assistive device for balance Standing balance-Leahy Scale: Fair Standing balance comment: reliant on RW                           ADL either performed or assessed with clinical judgement   ADL Overall ADL's : Needs assistance/impaired                     Lower Body Dressing: Minimal assistance;Sitting/lateral leans Lower Body Dressing Details (indicate cue type and reason): Simulated in room with reacher and sock aid Toilet Transfer: Contact guard assist;Ambulation;BSC/3in1;Rolling walker (2 wheels)           Functional mobility during ADLs: Contact guard assist;Rolling walker (2 wheels)      Extremity/Trunk Assessment Upper Extremity Assessment Upper Extremity Assessment: Overall WFL for tasks assessed   Lower Extremity Assessment Lower Extremity Assessment: Defer to PT evaluation        Vision   Vision Assessment?: Wears glasses for reading;No apparent visual deficits   Perception     Praxis     Communication Communication Communication: No apparent difficulties   Cognition Arousal: Alert Behavior During Therapy: WFL for tasks assessed/performed Cognition: No apparent impairments  Following commands: Intact        Cueing   Cueing Techniques: Verbal cues, Visual cues  Exercises      Shoulder Instructions       General Comments VSS on RA    Pertinent Vitals/ Pain       Pain Assessment Pain Assessment: 0-10 Pain Score: 6  Breathing: normal Pain Location: R hip Pain Descriptors / Indicators: Aching, Discomfort Pain Intervention(s): Monitored during session, RN gave  pain meds during session, Ice applied, Repositioned  Home Living                                          Prior Functioning/Environment              Frequency  Min 1X/week        Progress Toward Goals  OT Goals(current goals can now be found in the care plan section)  Progress towards OT goals: Progressing toward goals  Acute Rehab OT Goals Patient Stated Goal: To get better OT Goal Formulation: With patient Time For Goal Achievement: 11/18/23 Potential to Achieve Goals: Good ADL Goals Pt Will Perform Upper Body Dressing: with modified independence;sitting Pt Will Perform Lower Body Dressing: with supervision;with adaptive equipment;sitting/lateral leans Pt Will Transfer to Toilet: Independently;ambulating;bedside commode Pt Will Perform Tub/Shower Transfer: Tub transfer;Shower transfer;with supervision;ambulating  Plan      Co-evaluation        PT goals addressed during session: Proper use of DME;Mobility/safety with mobility;Balance        AM-PAC OT "6 Clicks" Daily Activity     Outcome Measure   Help from another person eating meals?: A Little Help from another person taking care of personal grooming?: A Little Help from another person toileting, which includes using toliet, bedpan, or urinal?: A Little Help from another person bathing (including washing, rinsing, drying)?: A Little Help from another person to put on and taking off regular upper body clothing?: A Little Help from another person to put on and taking off regular lower body clothing?: A Little 6 Click Score: 18    End of Session Equipment Utilized During Treatment: Gait belt;Rolling walker (2 wheels)  OT Visit Diagnosis: Unsteadiness on feet (R26.81);Other abnormalities of gait and mobility (R26.89);Muscle weakness (generalized) (M62.81)   Activity Tolerance Patient tolerated treatment well   Patient Left in bed;with call bell/phone within reach;with bed alarm set;with  family/visitor present   Nurse Communication Mobility status        Time: 4132-4401 OT Time Calculation (min): 43 min  Charges: OT General Charges $OT Visit: 1 Visit OT Treatments $Self Care/Home Management : 38-52 mins  Ivor Messier, OT  Acute Rehabilitation Services Office [phone] Secure chat preferred   Marilynne Drivers 11/06/2023, 4:39 PM

## 2023-11-06 NOTE — Telephone Encounter (Signed)
-----   Message from Neena Rhymes sent at 11/06/2023  8:02 AM EST ----- Labs look great!  No changes  I'm so sorry about your fall!  I hope your pain is well controlled and that things are going as well as possibly expected.  Hang in there!

## 2023-11-06 NOTE — Progress Notes (Signed)
  Progress Note   Patient: [name] Brown WUJ:[id number] DOB: 1951/07/04 DOA: 11/03/2023     3 DOS: the patient was seen and examined on 11/06/2023   Brief hospital course: 73 y.o. male with medical history significant for hypertension, hyperlipidemia, hypothyroidism, nonischemic cardiomyopathy with recovered EF, nonobstructive CAD, aortic stenosis status post bioprosthetic valve replacement, and colon cancer status post right colectomy who presents with right hip pain after a mechanical fall at home.    Patient was in his usual state of health when he was walking to his garage in the dark with his hands full, mistakenly stepped off the side of a ramp, and fell onto his right hip.  He denies hitting his head or losing consciousness but was experiencing immediate and severe pain in the right hip.     He was having an uneventful day prior to this and denies any recent illness.  He has not been exercising as much recently but remains active, never experiences angina, and states that he could ascend 2 flights of stairs prior to the fall.  Assessment and Plan:  1. Proximal right femur fracture  - Orthopedic surgery was consulted -Pt underwent surgery 2/21 -PT/OT recs for HHPTOT -Ortho recs for lovenox daily until mobilizing then transition to ASA 81mg  bid -Still requiring dilaudid for additional pain control. Family is concerned about the possibility of needing higher level of care.  -Will f/u on PT/OT re-eval today   2. CAD  - Hx of non-obstructive CAD  - No angina  - Held ASA for now, continue statin  - eventually continue with asa when pt is mobilizing   3. NICM  - EF was 50-55% on TTE from November 2024    - Appears euvolemic    4. Hypertension  - Diltiazem, losartan     5. Hypothyroidism  - Cont synthroid    6. ARF - hold ARB - Encouraged pt to hydrate -Cr improved   Subjective: Complaining of continued, poorly controlled pain  Physical Exam: Vitals:   11/05/23 2150 11/06/23  0550 11/06/23 0736 11/06/23 1510  BP: 123/64 125/80 129/66 (!) 113/51  Pulse: (!) 102 90 93 94  Resp: 18 18    Temp: 98.9 F (37.2 C) 98.4 F (36.9 C) 99.2 F (37.3 C) 98.1 F (36.7 C)  TempSrc: Oral Oral Oral Oral  SpO2: 92% 92% 94% 95%  Weight:      Height:       General exam: Conversant, appears in pain, grimacing Respiratory system: normal chest rise, clear, no audible wheezing Cardiovascular system: regular rhythm, s1-s2 Gastrointestinal system: Nondistended, nontender, pos BS Central nervous system: No seizures, no tremors Extremities: No cyanosis, no joint deformities Skin: No rashes, no pallor Psychiatry: Affect normal // no auditory hallucinations   Data Reviewed:  Labs reviewed: Na 137, 4.0, Cr 1.08, WBC 8.8, Hgb 10.7  Family Communication: Pt in room, family at bedside  Disposition: Status is: Inpatient Remains inpatient appropriate because: severity of illness  Planned Discharge Destination: Home with Home Health     Author: Rickey Barbara, MD 11/06/2023 5:02 PM  For on call review [email or web address].

## 2023-11-06 NOTE — Telephone Encounter (Signed)
 Pt has reviewed labs via MyChart

## 2023-11-06 NOTE — Plan of Care (Signed)
  Problem: Activity: Goal: Risk for activity intolerance will decrease Outcome: Progressing   Problem: Pain Managment: Goal: General experience of comfort will improve and/or be controlled Outcome: Progressing

## 2023-11-06 NOTE — Progress Notes (Signed)
.  Subjective: 3 Days Post-Op Procedure(s) (LRB): INTRAMEDULLARY (IM) NAIL FEMORAL (Right)  Patient seen and examined. Pain improved from prior to surgery but worse today than yesterday   Activity level:  PWB-25% for 2 weeks then can progress to 50% for 2 weeks then WBAT Diet tolerance:  as tolerated Patient reports pain as worse today than yesterday.    Objective: Vital signs in last 24 hours: Temp:  [97.6 F (36.4 C)-99.2 F (37.3 C)] 99.2 F (37.3 C) (02/24 0736) Pulse Rate:  [90-102] 93 (02/24 0736) Resp:  [18] 18 (02/24 0550) BP: (109-129)/(49-80) 129/66 (02/24 0736) SpO2:  [91 %-94 %] 94 % (02/24 0736)  Labs: Recent Labs    11/04/23 0431 11/05/23 0611 11/06/23 0629  HGB 12.4* 10.4* 10.7*   Recent Labs    11/05/23 0611 11/06/23 0629  WBC 7.2 8.8  RBC 3.15* 3.19*  HCT 30.1* 30.9*  PLT 122* 155   Recent Labs    11/05/23 0611 11/06/23 0629  NA 137 137  K 4.3 4.0  CL 103 103  CO2 28 28  BUN 16 15  CREATININE 1.21 1.08  GLUCOSE 117* 112*  CALCIUM 8.5* 8.4*   No results for input(s): "LABPT", "INR" in the last 72 hours.  Physical Exam:  Neurologically intact Neurovascular intact Sensation intact distally Intact pulses distally Dorsiflexion/Plantar flexion intact Incision: dressing C/D/I Compartment soft  Assessment/Plan:  3 Days Post-Op Procedure(s) (LRB): INTRAMEDULLARY (IM) NAIL FEMORAL (Right) PWB-25% for 2 weeks then can progress to 50% for 2 weeks then WBAT PT/OT DVT ppx: recommend lovenox daily until mobilizing then transition to aspirin 81mg  BID Pain control as needed Hgb 10.7 from 10.4. Due to acute blood loss anemia from surgery and fracture. Continue to trend CBC and transfuse as needed     [name] Brown 11/06/2023, 12:08 PM

## 2023-11-06 NOTE — Progress Notes (Signed)
 Transition of Care [facility]) - CAGE-AID Screening   Patient Details  Name: [name] Brown MRN: [id number] Date of Birth: May 12, 1951  Transition of Care [facility]) CM/SW Contact:    Leota Sauers, RN Phone Number: 11/06/2023, 9:21 PM   Clinical Narrative:  Patient does endorse the use of alcohol, denies illicit substances.   CAGE-AID Screening:    Have You Ever Felt You Ought to Cut Down on Your Drinking or Drug Use?: No Have People Annoyed You By Critizing Your Drinking Or Drug Use?: No Have You Felt Bad Or Guilty About Your Drinking Or Drug Use?: No Have You Ever Had a Drink or Used Drugs First Thing In The Morning to Steady Your Nerves or to Get Rid of a Hangover?: No CAGE-AID Score: 0  Substance Abuse Education Offered: No

## 2023-11-06 NOTE — Progress Notes (Signed)
 Mobility Specialist Progress Note:    11/06/23 1000  Mobility  Activity Transferred from bed to chair  Level of Assistance Minimal assist, patient does 75% or more  Assistive Device Front wheel walker  Distance Ambulated (ft) 4 ft  RLE Weight Bearing Per Provider Order PWB  RLE Partial Weight Bearing Percentage or Pounds 25%  Activity Response Tolerated well  Mobility Referral Yes  Mobility visit 1 Mobility  Mobility Specialist Start Time (ACUTE ONLY) 1000  Mobility Specialist Stop Time (ACUTE ONLY) 1011  Mobility Specialist Time Calculation (min) (ACUTE ONLY) 11 min   Pt received in bed and agreeable. Required minA for bed mobility and STS. Pivoted to chair, alternating RLE between NWB and PWB. C/o being more sore than yesterday. Pt left in chair with call bell and all needs met. Chair alarm on.  D'Vante Earlene Plater Mobility Specialist Please contact via Special educational needs teacher or Rehab office at [phone]

## 2023-11-07 ENCOUNTER — Inpatient Hospital Stay (HOSPITAL_COMMUNITY): Payer: Medicare Other

## 2023-11-07 ENCOUNTER — Ambulatory Visit: Payer: Medicare Other | Admitting: Family Medicine

## 2023-11-07 ENCOUNTER — Inpatient Hospital Stay (HOSPITAL_COMMUNITY)
Admission: AD | Admit: 2023-11-07 | Discharge: 2023-11-17 | DRG: 560 | Disposition: A | Discharge status: Home Health | Payer: Medicare Other | Source: Intra-hospital | Attending: Physical Medicine & Rehabilitation | Admitting: Physical Medicine & Rehabilitation

## 2023-11-07 DIAGNOSIS — I480 Paroxysmal atrial fibrillation: Secondary | ICD-10-CM | POA: Diagnosis present

## 2023-11-07 DIAGNOSIS — O2230 Deep phlebothrombosis in pregnancy, unspecified trimester: Secondary | ICD-10-CM | POA: Insufficient documentation

## 2023-11-07 DIAGNOSIS — Z8249 Family history of ischemic heart disease and other diseases of the circulatory system: Secondary | ICD-10-CM | POA: Diagnosis not present

## 2023-11-07 DIAGNOSIS — Z85820 Personal history of malignant melanoma of skin: Secondary | ICD-10-CM | POA: Diagnosis not present

## 2023-11-07 DIAGNOSIS — E669 Obesity, unspecified: Secondary | ICD-10-CM | POA: Diagnosis present

## 2023-11-07 DIAGNOSIS — F428 Other obsessive-compulsive disorder: Secondary | ICD-10-CM | POA: Diagnosis not present

## 2023-11-07 DIAGNOSIS — Z7982 Long term (current) use of aspirin: Secondary | ICD-10-CM

## 2023-11-07 DIAGNOSIS — E785 Hyperlipidemia, unspecified: Secondary | ICD-10-CM | POA: Diagnosis present

## 2023-11-07 DIAGNOSIS — Z823 Family history of stroke: Secondary | ICD-10-CM

## 2023-11-07 DIAGNOSIS — R7989 Other specified abnormal findings of blood chemistry: Secondary | ICD-10-CM | POA: Diagnosis not present

## 2023-11-07 DIAGNOSIS — M62838 Other muscle spasm: Secondary | ICD-10-CM | POA: Diagnosis present

## 2023-11-07 DIAGNOSIS — I82491 Acute embolism and thrombosis of other specified deep vein of right lower extremity: Secondary | ICD-10-CM | POA: Diagnosis present

## 2023-11-07 DIAGNOSIS — K429 Umbilical hernia without obstruction or gangrene: Secondary | ICD-10-CM | POA: Diagnosis present

## 2023-11-07 DIAGNOSIS — Z79890 Hormone replacement therapy (postmenopausal): Secondary | ICD-10-CM | POA: Diagnosis not present

## 2023-11-07 DIAGNOSIS — Z85038 Personal history of other malignant neoplasm of large intestine: Secondary | ICD-10-CM

## 2023-11-07 DIAGNOSIS — G4733 Obstructive sleep apnea (adult) (pediatric): Secondary | ICD-10-CM

## 2023-11-07 DIAGNOSIS — I82612 Acute embolism and thrombosis of superficial veins of left upper extremity: Secondary | ICD-10-CM | POA: Diagnosis present

## 2023-11-07 DIAGNOSIS — G2581 Restless legs syndrome: Secondary | ICD-10-CM

## 2023-11-07 DIAGNOSIS — Z6829 Body mass index (BMI) 29.0-29.9, adult: Secondary | ICD-10-CM | POA: Diagnosis not present

## 2023-11-07 DIAGNOSIS — I10 Essential (primary) hypertension: Secondary | ICD-10-CM | POA: Diagnosis not present

## 2023-11-07 DIAGNOSIS — E039 Hypothyroidism, unspecified: Secondary | ICD-10-CM | POA: Diagnosis present

## 2023-11-07 DIAGNOSIS — Z952 Presence of prosthetic heart valve: Secondary | ICD-10-CM

## 2023-11-07 DIAGNOSIS — S7291XA Unspecified fracture of right femur, initial encounter for closed fracture: Secondary | ICD-10-CM | POA: Diagnosis not present

## 2023-11-07 DIAGNOSIS — M79651 Pain in right thigh: Secondary | ICD-10-CM | POA: Diagnosis present

## 2023-11-07 DIAGNOSIS — W19XXXD Unspecified fall, subsequent encounter: Secondary | ICD-10-CM | POA: Diagnosis present

## 2023-11-07 DIAGNOSIS — S72001D Fracture of unspecified part of neck of right femur, subsequent encounter for closed fracture with routine healing: Principal | ICD-10-CM

## 2023-11-07 DIAGNOSIS — Z6831 Body mass index (BMI) 31.0-31.9, adult: Secondary | ICD-10-CM

## 2023-11-07 DIAGNOSIS — D72829 Elevated white blood cell count, unspecified: Secondary | ICD-10-CM | POA: Diagnosis present

## 2023-11-07 DIAGNOSIS — F4323 Adjustment disorder with mixed anxiety and depressed mood: Secondary | ICD-10-CM

## 2023-11-07 DIAGNOSIS — I2510 Atherosclerotic heart disease of native coronary artery without angina pectoris: Secondary | ICD-10-CM | POA: Diagnosis present

## 2023-11-07 DIAGNOSIS — S72001S Fracture of unspecified part of neck of right femur, sequela: Secondary | ICD-10-CM | POA: Diagnosis not present

## 2023-11-07 DIAGNOSIS — F32A Depression, unspecified: Secondary | ICD-10-CM | POA: Diagnosis present

## 2023-11-07 DIAGNOSIS — I824Y1 Acute embolism and thrombosis of unspecified deep veins of right proximal lower extremity: Secondary | ICD-10-CM | POA: Diagnosis not present

## 2023-11-07 DIAGNOSIS — K5901 Slow transit constipation: Secondary | ICD-10-CM | POA: Diagnosis present

## 2023-11-07 DIAGNOSIS — D696 Thrombocytopenia, unspecified: Secondary | ICD-10-CM | POA: Diagnosis present

## 2023-11-07 DIAGNOSIS — I7121 Aneurysm of the ascending aorta, without rupture: Secondary | ICD-10-CM | POA: Diagnosis present

## 2023-11-07 DIAGNOSIS — M7989 Other specified soft tissue disorders: Secondary | ICD-10-CM

## 2023-11-07 DIAGNOSIS — D62 Acute posthemorrhagic anemia: Secondary | ICD-10-CM | POA: Diagnosis not present

## 2023-11-07 DIAGNOSIS — Z9049 Acquired absence of other specified parts of digestive tract: Secondary | ICD-10-CM

## 2023-11-07 DIAGNOSIS — Z79899 Other long term (current) drug therapy: Secondary | ICD-10-CM

## 2023-11-07 DIAGNOSIS — Z7901 Long term (current) use of anticoagulants: Secondary | ICD-10-CM

## 2023-11-07 DIAGNOSIS — K5900 Constipation, unspecified: Secondary | ICD-10-CM | POA: Diagnosis not present

## 2023-11-07 DIAGNOSIS — Z841 Family history of disorders of kidney and ureter: Secondary | ICD-10-CM

## 2023-11-07 DIAGNOSIS — Z811 Family history of alcohol abuse and dependence: Secondary | ICD-10-CM

## 2023-11-07 DIAGNOSIS — F429 Obsessive-compulsive disorder, unspecified: Secondary | ICD-10-CM

## 2023-11-07 LAB — CBC
HCT: 29.9 % — ABNORMAL LOW (ref 39.0–52.0)
Hemoglobin: 10.4 g/dL — ABNORMAL LOW (ref 13.0–17.0)
MCH: 33.2 pg (ref 26.0–34.0)
MCHC: 34.8 g/dL (ref 30.0–36.0)
MCV: 95.5 fL (ref 80.0–100.0)
Platelets: 184 10*3/uL (ref 150–400)
RBC: 3.13 MIL/uL — ABNORMAL LOW (ref 4.22–5.81)
RDW: 12.8 % (ref 11.5–15.5)
WBC: 6.8 10*3/uL (ref 4.0–10.5)
nRBC: 0 % (ref 0.0–0.2)

## 2023-11-07 LAB — COMPREHENSIVE METABOLIC PANEL
ALT: 20 U/L (ref 0–44)
AST: 70 U/L — ABNORMAL HIGH (ref 15–41)
Albumin: 2.3 g/dL — ABNORMAL LOW (ref 3.5–5.0)
Alkaline Phosphatase: 39 U/L (ref 38–126)
Anion gap: 6 (ref 5–15)
BUN: 17 mg/dL (ref 8–23)
CO2: 27 mmol/L (ref 22–32)
Calcium: 8.4 mg/dL — ABNORMAL LOW (ref 8.9–10.3)
Chloride: 102 mmol/L (ref 98–111)
Creatinine, Ser: 1.03 mg/dL (ref 0.61–1.24)
GFR, Estimated: >60 mL/min (ref >60)
Glucose, Bld: 111 mg/dL — ABNORMAL HIGH (ref 70–99)
Potassium: 4.4 mmol/L (ref 3.5–5.1)
Sodium: 135 mmol/L (ref 135–145)
Total Bilirubin: 0.9 mg/dL (ref 0.0–1.2)
Total Protein: 4.9 g/dL — ABNORMAL LOW (ref 6.5–8.1)

## 2023-11-07 MED ORDER — BISACODYL 5 MG PO TBEC: 5.0000 mg | DELAYED_RELEASE_TABLET | Freq: Every day | ORAL | Status: DC | PRN

## 2023-11-07 MED ORDER — ASPIRIN 81 MG PO CHEW
81.0000 mg | CHEWABLE_TABLET | Freq: Two times a day (BID) | ORAL | Status: DC
Start: 2023-11-07 — End: 2023-11-07

## 2023-11-07 MED ORDER — ACETAMINOPHEN 325 MG PO TABS: 325.0000 mg | ORAL_TABLET | ORAL | Status: DC | PRN

## 2023-11-07 MED ORDER — FOLIC ACID 1 MG PO TABS
1.0000 mg | ORAL_TABLET | Freq: Every day | ORAL | Status: DC
  Administered 2023-11-08 – 2023-11-17 (×10): 1 mg via ORAL
  Filled 2023-11-07 (×10): qty 1

## 2023-11-07 MED ORDER — SENNOSIDES-DOCUSATE SODIUM 8.6-50 MG PO TABS: 2.0000 | ORAL_TABLET | Freq: Every evening | ORAL | Status: DC | PRN

## 2023-11-07 MED ORDER — PROCHLORPERAZINE 25 MG RE SUPP
12.5000 mg | Freq: Four times a day (QID) | RECTAL | Status: DC | PRN
  Filled 2023-11-07: qty 1

## 2023-11-07 MED ORDER — ENSURE ENLIVE PO LIQD: 237.0000 mL | Freq: Two times a day (BID) | ORAL | Status: DC

## 2023-11-07 MED ORDER — ALUM & MAG HYDROXIDE-SIMETH 200-200-20 MG/5ML PO SUSP: 30.0000 mL | ORAL | Status: DC | PRN

## 2023-11-07 MED ORDER — HYDROMORPHONE HCL 1 MG/ML IJ SOLN
0.5000 mg | INTRAMUSCULAR | Status: DC | PRN
Start: 2023-11-07 — End: 2023-11-17

## 2023-11-07 MED ORDER — ADULT MULTIVITAMIN W/MINERALS CH
1.0000 | ORAL_TABLET | Freq: Every day | ORAL | Status: DC
  Administered 2023-11-08 – 2023-11-17 (×10): 1 via ORAL
  Filled 2023-11-07 (×11): qty 1

## 2023-11-07 MED ORDER — ADULT MULTIVITAMIN W/MINERALS CH: 1.0000 | ORAL_TABLET | Freq: Every day | ORAL | Status: AC

## 2023-11-07 MED ORDER — GUAIFENESIN-DM 100-10 MG/5ML PO SYRP: 5.0000 mL | ORAL_SOLUTION | Freq: Four times a day (QID) | ORAL | Status: DC | PRN

## 2023-11-07 MED ORDER — DILTIAZEM HCL ER COATED BEADS 120 MG PO CP24
120.0000 mg | ORAL_CAPSULE | Freq: Every day | ORAL | Status: DC
  Administered 2023-11-07 – 2023-11-16 (×10): 120 mg via ORAL
  Filled 2023-11-07 (×11): qty 1

## 2023-11-07 MED ORDER — TRAZODONE HCL 50 MG PO TABS: 25.0000 mg | ORAL_TABLET | Freq: Every evening | ORAL | Status: DC | PRN

## 2023-11-07 MED ORDER — PRAVASTATIN SODIUM 40 MG PO TABS
20.0000 mg | ORAL_TABLET | Freq: Every day | ORAL | Status: DC
  Administered 2023-11-07 – 2023-11-16 (×10): 20 mg via ORAL
  Filled 2023-11-07 (×10): qty 1

## 2023-11-07 MED ORDER — THIAMINE MONONITRATE 100 MG PO TABS
100.0000 mg | ORAL_TABLET | Freq: Every day | ORAL | Status: DC
  Administered 2023-11-08 – 2023-11-17 (×10): 100 mg via ORAL
  Filled 2023-11-07 (×10): qty 1

## 2023-11-07 MED ORDER — OXYCODONE HCL 5 MG PO TABS: 5.0000 mg | ORAL_TABLET | ORAL | Status: DC | PRN

## 2023-11-07 MED ORDER — SENNOSIDES-DOCUSATE SODIUM 8.6-50 MG PO TABS: 1.0000 | ORAL_TABLET | Freq: Every evening | ORAL | Status: DC | PRN

## 2023-11-07 MED ORDER — DIPHENHYDRAMINE HCL 25 MG PO CAPS
25.0000 mg | ORAL_CAPSULE | Freq: Four times a day (QID) | ORAL | Status: DC | PRN
  Administered 2023-11-16: 25 mg via ORAL
  Filled 2023-11-07: qty 1

## 2023-11-07 MED ORDER — ENOXAPARIN SODIUM 40 MG/0.4ML IJ SOSY
40.0000 mg | PREFILLED_SYRINGE | INTRAMUSCULAR | Status: DC
  Administered 2023-11-07 – 2023-11-08 (×2): 40 mg via SUBCUTANEOUS
  Filled 2023-11-07 (×2): qty 0.4

## 2023-11-07 MED ORDER — SORBITOL 70 % SOLN
30.0000 mL | Freq: Every day | Status: DC | PRN
  Filled 2023-11-07: qty 30

## 2023-11-07 MED ORDER — OXYCODONE HCL 5 MG PO TABS
5.0000 mg | ORAL_TABLET | Freq: Two times a day (BID) | ORAL | Status: DC
  Administered 2023-11-08 – 2023-11-17 (×18): 5 mg via ORAL
  Filled 2023-11-07 (×21): qty 1

## 2023-11-07 MED ORDER — CYCLOBENZAPRINE HCL 5 MG PO TABS: 5.0000 mg | ORAL_TABLET | Freq: Three times a day (TID) | ORAL | Status: DC | PRN

## 2023-11-07 MED ORDER — PROCHLORPERAZINE EDISYLATE 10 MG/2ML IJ SOLN: 5.0000 mg | Freq: Four times a day (QID) | INTRAMUSCULAR | Status: DC | PRN

## 2023-11-07 MED ORDER — FOLIC ACID 1 MG PO TABS: 1.0000 mg | ORAL_TABLET | Freq: Every day | ORAL | Status: DC

## 2023-11-07 MED ORDER — ROPINIROLE HCL 0.5 MG PO TABS
0.7500 mg | ORAL_TABLET | Freq: Every day | ORAL | Status: DC
  Administered 2023-11-07 – 2023-11-16 (×10): 0.75 mg via ORAL
  Filled 2023-11-07 (×12): qty 1

## 2023-11-07 MED ORDER — CALCIUM CARBONATE ANTACID 500 MG PO CHEW: 1.0000 | CHEWABLE_TABLET | Freq: Three times a day (TID) | ORAL | Status: DC | PRN

## 2023-11-07 MED ORDER — LEVOTHYROXINE SODIUM 100 MCG PO TABS
100.0000 ug | ORAL_TABLET | Freq: Every day | ORAL | Status: DC
  Administered 2023-11-08 – 2023-11-17 (×10): 100 ug via ORAL
  Filled 2023-11-07 (×10): qty 1

## 2023-11-07 MED ORDER — FAMOTIDINE 20 MG PO TABS
20.0000 mg | ORAL_TABLET | Freq: Every day | ORAL | Status: DC
  Administered 2023-11-08 – 2023-11-17 (×10): 20 mg via ORAL
  Filled 2023-11-07 (×10): qty 1

## 2023-11-07 MED ORDER — CYCLOBENZAPRINE HCL 5 MG PO TABS
5.0000 mg | ORAL_TABLET | Freq: Three times a day (TID) | ORAL | Status: DC | PRN
  Administered 2023-11-07 – 2023-11-16 (×10): 5 mg via ORAL
  Filled 2023-11-07 (×13): qty 1

## 2023-11-07 MED ORDER — PROCHLORPERAZINE MALEATE 5 MG PO TABS: 5.0000 mg | ORAL_TABLET | Freq: Four times a day (QID) | ORAL | Status: DC | PRN

## 2023-11-07 MED ORDER — SENNOSIDES-DOCUSATE SODIUM 8.6-50 MG PO TABS
2.0000 | ORAL_TABLET | Freq: Every day | ORAL | Status: DC
  Administered 2023-11-08: 2 via ORAL
  Filled 2023-11-07 (×2): qty 2

## 2023-11-07 MED ORDER — ASPIRIN EC 81 MG PO TBEC: 81.0000 mg | DELAYED_RELEASE_TABLET | Freq: Two times a day (BID) | ORAL | Status: DC

## 2023-11-07 MED ORDER — ASPIRIN 81 MG PO TBEC
81.0000 mg | DELAYED_RELEASE_TABLET | Freq: Every day | ORAL | Status: DC
  Administered 2023-11-08 – 2023-11-17 (×10): 81 mg via ORAL
  Filled 2023-11-07 (×10): qty 1

## 2023-11-07 MED ORDER — OXYCODONE HCL 5 MG PO TABS
5.0000 mg | ORAL_TABLET | ORAL | Status: DC | PRN
  Administered 2023-11-07: 5 mg via ORAL
  Administered 2023-11-07: 10 mg via ORAL
  Administered 2023-11-08: 5 mg via ORAL
  Administered 2023-11-08 (×2): 10 mg via ORAL
  Administered 2023-11-08 – 2023-11-09 (×2): 5 mg via ORAL
  Administered 2023-11-09 – 2023-11-11 (×6): 10 mg via ORAL
  Administered 2023-11-11: 5 mg via ORAL
  Administered 2023-11-11 – 2023-11-12 (×2): 10 mg via ORAL
  Administered 2023-11-12: 5 mg via ORAL
  Administered 2023-11-12: 10 mg via ORAL
  Administered 2023-11-12: 5 mg via ORAL
  Administered 2023-11-12 – 2023-11-13 (×5): 10 mg via ORAL
  Administered 2023-11-13: 5 mg via ORAL
  Administered 2023-11-14 – 2023-11-15 (×5): 10 mg via ORAL
  Administered 2023-11-15: 5 mg via ORAL
  Administered 2023-11-16 (×3): 10 mg via ORAL
  Administered 2023-11-16: 5 mg via ORAL
  Administered 2023-11-16: 10 mg via ORAL
  Filled 2023-11-07 (×2): qty 2
  Filled 2023-11-07: qty 1
  Filled 2023-11-07 (×3): qty 2
  Filled 2023-11-07: qty 1
  Filled 2023-11-07: qty 2
  Filled 2023-11-07: qty 1
  Filled 2023-11-07 (×3): qty 2
  Filled 2023-11-07 (×2): qty 1
  Filled 2023-11-07 (×8): qty 2
  Filled 2023-11-07: qty 1
  Filled 2023-11-07 (×7): qty 2
  Filled 2023-11-07: qty 1
  Filled 2023-11-07 (×3): qty 2
  Filled 2023-11-07 (×2): qty 1
  Filled 2023-11-07: qty 2

## 2023-11-07 MED ORDER — VITAMIN B-1 100 MG PO TABS: 100.0000 mg | ORAL_TABLET | Freq: Every day | ORAL | Status: DC

## 2023-11-07 MED ORDER — FLEET ENEMA RE ENEM
1.0000 | ENEMA | Freq: Once | RECTAL | Status: AC | PRN
  Administered 2023-11-11: 1 via RECTAL
  Filled 2023-11-07: qty 1

## 2023-11-07 NOTE — Discharge Summary (Signed)
 Physician Discharge Summary   Patient: [name] Brown MRN: [id number] DOB: October 17, 1950  Admit date:     11/03/2023  Discharge date: 11/07/23  Discharge Physician: Rickey Barbara   PCP: Sheliah Hatch, MD   Recommendations at discharge:    Follow up with PCP in 1-2 weeks after discharge Follow up with Dr. Hulda Humphrey after discharge  Discharge Diagnoses: Principal Problem:   Closed right hip fracture, initial encounter [facility]) Active Problems:   Essential hypertension   Coronary artery disease involving native coronary artery of native heart without angina pectoris   Hyperlipidemia   Non-ischemic cardiomyopathy (HCC)   Hypothyroid  Resolved Problems:   * No resolved hospital problems. *  Hospital Course: 73 y.o. male with medical history significant for hypertension, hyperlipidemia, hypothyroidism, nonischemic cardiomyopathy with recovered EF, nonobstructive CAD, aortic stenosis status post bioprosthetic valve replacement, and colon cancer status post right colectomy who presents with right hip pain after a mechanical fall at home.    Patient was in his usual state of health when he was walking to his garage in the dark with his hands full, mistakenly stepped off the side of a ramp, and fell onto his right hip.  He denies hitting his head or losing consciousness but was experiencing immediate and severe pain in the right hip.     He was having an uneventful day prior to this and denies any recent illness.  He has not been exercising as much recently but remains active, never experiences angina, and states that he could ascend 2 flights of stairs prior to the fall.  Assessment and Plan: 1. Proximal right femur fracture  - Orthopedic surgery was consulted -Pt underwent surgery 2/21 -PT/OT following -Ortho recs for lovenox daily until mobilizing then transition to ASA 81mg  bid -Had recently been requiring dilaudid for additional pain control.  -Consulted CIR. Pt planned for inpt rehab  today   2. CAD  - Hx of non-obstructive CAD  - No angina  - Initially held ASA pre-op, continue statin . - resumed asa   3. NICM  - EF was 50-55% on TTE from November 2024    - Appears euvolemic    4. Hypertension  - Diltiazem, losartan     5. Hypothyroidism  - Cont synthroid     6. ARF - hold ARB given normotension - Encouraged pt to hydrate -Cr improved       Consultants: Orthopedic Surgery Procedures performed: INTRAMEDULLARY (IM) NAIL FEMORAL (Right: Hip)   Disposition: Rehabilitation facility Diet recommendation:  Regular diet DISCHARGE MEDICATION: Allergies as of 11/07/2023   No Known Allergies      Medication List     STOP taking these medications    doxycycline 50 MG capsule Commonly known as: VIBRAMYCIN   FLUoxetine 10 MG capsule Commonly known as: PROZAC   losartan 100 MG tablet Commonly known as: COZAAR       TAKE these medications    aspirin EC 81 MG tablet Take 1 tablet (81 mg total) by mouth 2 (two) times daily. What changed: when to take this   bisacodyl 5 MG EC tablet Commonly known as: DULCOLAX Take 1 tablet (5 mg total) by mouth daily as needed for moderate constipation.   calcium carbonate 500 MG chewable tablet Commonly known as: TUMS - dosed in mg elemental calcium Chew 1 tablet (200 mg of elemental calcium total) by mouth 3 (three) times daily as needed for indigestion or heartburn.   CLEAR EYES FOR DRY EYES OP Place 1 Drop/kg  into both eyes as needed (Severe dry eye).   cyclobenzaprine 5 MG tablet Commonly known as: FLEXERIL Take 1 tablet (5 mg total) by mouth 3 (three) times daily as needed for muscle spasms.   diltiazem 120 MG 24 hr capsule Commonly known as: CARDIZEM CD TAKE 1 CAPSULE BY MOUTH DAILY What changed: when to take this   ezetimibe 10 MG tablet Commonly known as: ZETIA TAKE 1 TABLET BY MOUTH DAILY What changed: when to take this   famotidine 20 MG tablet Commonly known as: PEPCID Take 20 mg by  mouth daily as needed for heartburn or indigestion.   feeding supplement Liqd Take 237 mLs by mouth 2 (two) times daily between meals.   folic acid 1 MG tablet Commonly known as: FOLVITE Take 1 tablet (1 mg total) by mouth daily. Start taking on: November 08, 2023   HYDROmorphone 1 MG/ML injection Commonly known as: DILAUDID Inject 0.5-1 mLs (0.5-1 mg total) into the vein every 2 (two) hours as needed for severe pain (pain score 7-10).   levothyroxine 100 MCG tablet Commonly known as: SYNTHROID Take 1 tablet (100 mcg total) by mouth daily.   multivitamin with minerals Tabs tablet Take 1 tablet by mouth daily. Start taking on: November 08, 2023   oxyCODONE 5 MG immediate release tablet Commonly known as: Oxy IR/ROXICODONE Take 1 tablet (5 mg total) by mouth every 4 (four) hours as needed for moderate pain (pain score 4-6).   pravastatin 20 MG tablet Commonly known as: PRAVACHOL Take 1 tablet (20 mg total) by mouth daily. What changed: when to take this   rOPINIRole 0.25 MG tablet Commonly known as: Requip Take 3 tablets (0.75 mg total) by mouth at bedtime.   senna-docusate 8.6-50 MG tablet Commonly known as: Senokot-S Take 1 tablet by mouth at bedtime as needed for mild constipation.   thiamine 100 MG tablet Commonly known as: Vitamin B-1 Take 1 tablet (100 mg total) by mouth daily. Start taking on: November 08, 2023   Xiidra 5 % Soln Generic drug: Lifitegrast Place 1 Dose into both eyes 2 (two) times daily.        Follow-up Information     Sheliah Hatch, MD Follow up.   Specialty: Family Medicine Why: Hospital follow up Contact information: [address] [place] [address] [phone]         Luci Bank, MD Follow up.   Specialty: Orthopedic Surgery Why: Hospital follow up Contact information: [address] [place] [address] [phone]                Discharge Exam: Ceasar Mons Weights   11/03/23 0042 11/03/23  1036  Weight: 83 kg 81.6 kg   General exam: Awake, laying in bed, in nad Respiratory system: Normal respiratory effort, no wheezing Cardiovascular system: regular rate, s1, s2 Gastrointestinal system: Soft, nondistended, positive BS Central nervous system: CN2-12 grossly intact, strength intact Extremities: Perfused, no clubbing Skin: Normal skin turgor, no notable skin lesions seen Psychiatry: Mood normal // no visual hallucinations   Condition at discharge: fair  The results of significant diagnostics from this hospitalization (including imaging, microbiology, ancillary and laboratory) are listed below for reference.   Imaging Studies: DG FEMUR, MIN 2 VIEWS RIGHT Result Date: 11/03/2023 CLINICAL DATA:  Right intramedullary nail approximate femur fracture fixation. Intraoperative fluoroscopy. EXAM: RIGHT FEMUR 2 VIEWS COMPARISON:  Right femur radiographs 11/03/2023 FINDINGS: Images were performed intraoperatively without the presence of a radiologist. The patient is undergoing right  femoral long intramedullary nail fixation of the previously seen proximal femoral diaphyseal fracture. Improved alignment. No evidence of hardware failure. Proximally displaced lesser trochanter fracture again noted. Total fluoroscopy images: 4 Total fluoroscopy time: 292 seconds Total dose: Radiation Exposure Index (as provided by the fluoroscopic device): 60.87 mGy air Kerma Please see intraoperative findings for further detail. IMPRESSION: Intraoperative fluoroscopy for right femoral long intramedullary nail fixation of the previously seen proximal femoral diaphyseal fracture. Electronically Signed   By: Neita Garnet M.D.   On: 11/03/2023 16:48   DG C-Arm 1-60 Min-No Report Result Date: 11/03/2023 Fluoroscopy was utilized by the requesting physician.  No radiographic interpretation.   DG C-Arm 1-60 Min-No Report Result Date: 11/03/2023 Fluoroscopy was utilized by the requesting physician.  No radiographic  interpretation.   DG C-Arm 1-60 Min-No Report Result Date: 11/03/2023 Fluoroscopy was utilized by the requesting physician.  No radiographic interpretation.   DG CHEST PORT 1 VIEW Result Date: 11/03/2023 CLINICAL DATA:  Preop right hip fracture EXAM: PORTABLE CHEST 1 VIEW COMPARISON:  CT chest dated 08/22/2023 FINDINGS: Lungs are clear.  No pleural effusion or pneumothorax. Heart is normal in size.  Prosthetic aortic valve. Median sternotomy. IMPRESSION: No acute cardiopulmonary disease. Electronically Signed   By: Charline Bills M.D.   On: 11/03/2023 02:16   DG Pelvis 1-2 Views Result Date: 11/03/2023 CLINICAL DATA:  Fall, right leg pain EXAM: PELVIS - 1-2 VIEW COMPARISON:  None Available. FINDINGS: Right proximal femoral shaft fracture, better evaluated on dedicated femur radiographs. Additionally, there is a suspected nondisplaced intertrochanteric right hip fracture with a displaced lesser trochanter fragment. Bilateral hip joint spaces are preserved. Visualized bony pelvis appears intact. IMPRESSION: Suspected nondisplaced intertrochanteric right hip fracture, as above. Additional right proximal femoral shaft fracture, better evaluated on dedicated femur radiographs. Electronically Signed   By: Charline Bills M.D.   On: 11/03/2023 01:31   DG Femur Min 2 Views Right Result Date: 11/03/2023 CLINICAL DATA:  Right leg pain EXAM: RIGHT FEMUR 2 VIEWS COMPARISON:  None Available. FINDINGS: Displaced oblique proximal femoral shaft fracture. IMPRESSION: Displaced oblique proximal femoral shaft fracture. Electronically Signed   By: Charline Bills M.D.   On: 11/03/2023 01:30    Microbiology: Results for orders placed or performed during the hospital encounter of 01/04/21  Resp Panel by RT-PCR (Flu A&B, Covid) Nasopharyngeal Swab     Status: None   Collection Time: 01/04/21  8:42 AM   Specimen: Nasopharyngeal Swab; Nasopharyngeal(NP) swabs in vial transport medium  Result Value Ref Range Status    SARS Coronavirus 2 by RT PCR NEGATIVE NEGATIVE Final    Comment: (NOTE) SARS-CoV-2 target nucleic acids are NOT DETECTED.  The SARS-CoV-2 RNA is generally detectable in upper respiratory specimens during the acute phase of infection. The lowest concentration of SARS-CoV-2 viral copies this assay can detect is 138 copies/mL. A negative result does not preclude SARS-Cov-2 infection and should not be used as the sole basis for treatment or other patient management decisions. A negative result may occur with  improper specimen collection/handling, submission of specimen other than nasopharyngeal swab, presence of viral mutation(s) within the areas targeted by this assay, and inadequate number of viral copies(<138 copies/mL). A negative result must be combined with clinical observations, patient history, and epidemiological information. The expected result is Negative.  Fact Sheet for Patients:  BloggerCourse.com  Fact Sheet for Healthcare Providers:  SeriousBroker.it  This test is no t yet approved or cleared by the [place] and  has been authorized  for detection and/or diagnosis of SARS-CoV-2 by FDA under an Emergency Use Authorization (EUA). This EUA will remain  in effect (meaning this test can be used) for the duration of the COVID-19 declaration under Section 564(b)(1) of the Act, 21 U.S.C.section 360bbb-3(b)(1), unless the authorization is terminated  or revoked sooner.       Influenza A by PCR NEGATIVE NEGATIVE Final   Influenza B by PCR NEGATIVE NEGATIVE Final    Comment: (NOTE) The Xpert Xpress SARS-CoV-2/FLU/RSV plus assay is intended as an aid in the diagnosis of influenza from Nasopharyngeal swab specimens and should not be used as a sole basis for treatment. Nasal washings and aspirates are unacceptable for Xpert Xpress SARS-CoV-2/FLU/RSV testing.  Fact Sheet for  Patients: BloggerCourse.com  Fact Sheet for Healthcare Providers: SeriousBroker.it  This test is not yet approved or cleared by the [place] FDA and has been authorized for detection and/or diagnosis of SARS-CoV-2 by FDA under an Emergency Use Authorization (EUA). This EUA will remain in effect (meaning this test can be used) for the duration of the COVID-19 declaration under Section 564(b)(1) of the Act, 21 U.S.C. section 360bbb-3(b)(1), unless the authorization is terminated or revoked.  Performed at [facility]     Labs: CBC: Recent Labs  Lab 11/02/23 1054 11/03/23 0107 11/03/23 0357 11/04/23 0431 11/05/23 1610 11/06/23 0629 11/07/23 0613  WBC 5.9 7.8 15.7* 12.8* 7.2 8.8 6.8  NEUTROABS 3.9 5.5  --   --   --   --   --   HGB 17.1* 16.9 16.6 12.4* 10.4* 10.7* 10.4*  HCT 50.2 47.8 46.9 34.9* 30.1* 30.9* 29.9*  MCV 98.2 93.0 92.5 94.1 95.6 96.9 95.5  PLT 165.0 183 199 186 122* 155 184   Basic Metabolic Panel: Recent Labs  Lab 11/03/23 0357 11/04/23 0431 11/05/23 0611 11/06/23 0629 11/07/23 0613  NA 138 139 137 137 135  K 4.2 4.5 4.3 4.0 4.4  CL 104 97* 103 103 102  CO2 22 25 28 28 27   GLUCOSE 133* 165* 117* 112* 111*  BUN 13 15 16 15 17   CREATININE 0.92 1.42* 1.21 1.08 1.03  CALCIUM 9.1 9.2 8.5* 8.4* 8.4*   Liver Function Tests: Recent Labs  Lab 11/02/23 1054 11/03/23 0107 11/07/23 0613  AST 30 31 70*  ALT 44 44 20  ALKPHOS 62 63 39  BILITOT 0.8 0.7 0.9  PROT 6.8 6.6 4.9*  ALBUMIN 4.3 4.1 2.3*   CBG: No results for input(s): "GLUCAP" in the last 168 hours.  Discharge time spent: less than 30 minutes.  Signed: Rickey Barbara, MD [facility] 11/07/2023

## 2023-11-07 NOTE — Progress Notes (Signed)
 Inpatient Rehab Admissions Coordinator:   Pt. Will admit to CIR today. RN may call report to [phone]  Pt. Will admit to CIR for an estimated 7-10 days with the goal of reaching supervision to mod I level and returning home with assistance from his wife.   Megan Salon, MS, CCC-SLP Rehab Admissions Coordinator  [phone] (celll) [phone] (office)

## 2023-11-07 NOTE — Progress Notes (Signed)
 Inpatient Rehabilitation Center Individual Statement of Services  Patient Name:  [name] Brown  Date:  11/07/2023  Welcome to the Inpatient Rehabilitation Center.  Our goal is to provide you with an individualized program based on your diagnosis and situation, designed to meet your specific needs.  With this comprehensive rehabilitation program, you will be expected to participate in at least 3 hours of rehabilitation therapies Monday-Friday, with modified therapy programming on the weekends.  Your rehabilitation program will include the following services:  Physical Therapy (PT), Occupational Therapy (OT), 24 hour per day rehabilitation nursing, Therapeutic Recreaction (TR), Care Coordinator, Rehabilitation Medicine, Nutrition Services, and Pharmacy Services  Weekly team conferences will be held on Wendesday to discuss your progress.  Your Inpatient Rehabilitation Care Coordinator will talk with you frequently to get your input and to update you on team discussions.  Team conferences with you and your family in attendance may also be held.  Expected length of stay: 10-12 days  Overall anticipated outcome: mod/I level  Depending on your progress and recovery, your program may change. Your Inpatient Rehabilitation Care Coordinator will coordinate services and will keep you informed of any changes. Your Inpatient Rehabilitation Care Coordinator's name and contact numbers are listed  below.  The following services may also be recommended but are not provided by the Inpatient Rehabilitation Center:  Driving Evaluations Home Health Rehabiltiation Services Outpatient Rehabilitation Services    Arrangements will be made to provide these services after discharge if needed.  Arrangements include referral to agencies that provide these services.  Your insurance has been verified to be:  medicare & [employer] Your primary doctor is:  Neena Rhymes  Pertinent information will be shared with your  doctor and your insurance company.  Inpatient Rehabilitation Care Coordinator:  Dossie Der, [place] [phone] or Luna [place]  Information discussed with and copy given to patient by: Lucy Chris, 11/07/2023, 2:34 PM

## 2023-11-07 NOTE — Progress Notes (Signed)
 Report called to Joni Reining, RN on [facility].

## 2023-11-07 NOTE — Progress Notes (Signed)
 Inpatient Rehabilitation Admission Medication Review by a Pharmacist  A complete drug regimen review was completed for this patient to identify any potential clinically significant medication issues.  High Risk Drug Classes Is patient taking? Indication by Medication  Antipsychotic Yes, as an intravenous medication PRN Prochlorperazine (PO, PR or IV) - nausea  Anticoagulant Yes Enoxaparin - VTE prophylaxis  Antibiotic No   Opioid Yes Oxycodone BID prior to therapies - pain control  Antiplatelet Yes Aspirin 81 mg - CAD  Hypoglycemics/insulin No   Vasoactive Medication Yes Diltiazem CD - hypertension  Chemotherapy No   Other Yes Famotidine - GI prophylaxis Levothyroxine - hypothyroidism Pravastatin - hyperlipidemia Ropinirole - restless leg Multivitamin, folate, thiamine - supplements Senna-docusate - laxative  PRNs: Acetaminophen - mild pain Maalox - indigestion Cyclobenzaprine - muscle spasms Diphenhydramine - itching Guaifenesin-dextromethorphan - cough Trazodone - sleep Sorbitol, Fleets enema - constipation     Type of Medication Issue Identified Description of Issue Recommendation(s)  Drug Interaction(s) (clinically significant)     Duplicate Therapy     Allergy     No Medication Administration End Date     Incorrect Dose     Additional Drug Therapy Needed     Significant med changes from prior encounter (inform family/care partners about these prior to discharge). Holding Losartan due to renal function and current blood pressure. Fluoxetine discontinued > had reported not yet starting PTA. Famotidine daily PRN PTA > now daily.  New Oxycodone, supplements, laxatives, prns. Could consider resuming Losartan if clinically warranted and renal function stabilizes.    Communicate changes with patient/family prior to discharge.    Other  DC summary indicates plan to resume Ezetimibe and Xiidra eye drops.  Was not on either med during inpatient admit. Resume  Ezetimibe during CIR admit or at discharge.  Resume Xiidra at discharge. Not stocked at [facility]. Could use Artificial Tears or have family bring [place] from home.     Clinically significant medication issues were identified that warrant physician communication and completion of prescribed/recommended actions by midnight of the next day:  No  Pharmacist comments:   Time spent performing this drug regimen review (minutes):  [address]   Dennie Fetters, [place] 11/07/2023 4:12 PM

## 2023-11-07 NOTE — H&P (Signed)
 Physical Medicine and Rehabilitation Admission H&P    Chief Complaint  Patient presents with   Functional deficits due to fall with R femur Fx    HPI: [name] Brown is a 73 year old male with history of HTN, hypothyroid, non-obstructive CAD, AV replacement, colon CA s/p right colectomy who was admitted on 11/03/23 after stepping off ramp with fall onto right hip and immediate onset of right hip pain. He was found to have right femur cephalomedullary Fx and underwent IM nail on the same day by Dr. Hulda Humphrey. Post op to be 25% WB X 2 weeks followed by 50% WB X 2 weeks then WBAT. Post op reported penile numbness as well as thigh pain. .   Reactive leucocytosis and thrombocytopenia has resolved. ABLA being monitored. Started on ASA bid for DVT prophylaxis today. PT/OT has been working with patient who requires CGA to min assist with mobility and ADLs. CIR recommended due to functional decline.   Pt reports pain 5.5/10 currently, but can spike to 9/10 with movement/therapy.  Taking Oxy q4 hours or so.  Doesn't like Sorbitol (is a retired Teacher, early years/pre) Would rather take Senokot- a few tabs to go.  LBM 3-4 days ago- feels constipated  Review of Systems  Constitutional:  Negative for fever.  HENT:  Negative for hearing loss.   Eyes:  Negative for blurred vision.       Dry eyes  Respiratory:  Negative for cough and shortness of breath.   Cardiovascular:  Negative for chest pain.  Gastrointestinal:  Positive for constipation and heartburn. Negative for abdominal pain and diarrhea.  Genitourinary:  Negative for frequency and urgency.  Musculoskeletal:  Positive for joint pain.  Skin:  Negative for rash.  Neurological:  Positive for weakness.  Psychiatric/Behavioral:  The patient has insomnia.   All other systems reviewed and are negative.    Past Medical History:  Diagnosis Date   Aortic stenosis    Ascending aortic aneurysm (HCC) 09/18/2018   Colon cancer (HCC)    Family history of  adverse reaction to anesthesia    pt states his mother had 2 episodes of hyponatremia following anesthesia    GERD (gastroesophageal reflux disease)    Headache    Heart murmur    Hyperlipidemia 10/25/2016   Hypertension    pt is currently not taking any medications; pt states is borderline   Melanoma (HCC)    OSA on CPAP 09/18/2018   PAF (paroxysmal atrial fibrillation) (HCC) 06/13/2017   Perforated sigmoid colon (HCC)    PVC's (premature ventricular contractions) 10/25/2016   Tinnitus    Wears glasses     Past Surgical History:  Procedure Laterality Date   AORTIC VALVE REPLACEMENT N/A 06/08/2017   Procedure: AORTIC VALVE REPLACEMENT (AVR);  Surgeon: Alleen Borne, MD;  Location: [facility] OR;  Service: Open Heart Surgery;  Laterality: N/A;  Using 25mm Perimount Magna Ease Pericardial Bioprosthesis Aortic Valve   COLOSTOMY CLOSURE N/A 08/18/2016   Procedure: COLOSTOMY CLOSURE OPEN PROCEDURE;  Surgeon: Darnell Level, MD;  Location: [facility] ORS;  Service: General;  Laterality: N/A;   FEMUR IM NAIL Right 11/03/2023   Procedure: INTRAMEDULLARY (IM) NAIL FEMORAL;  Surgeon: Luci Bank, MD;  Location: [facility] OR;  Service: Orthopedics;  Laterality: Right;   LAPAROTOMY N/A 04/25/2016   Procedure: EXPLORATORY LAPAROTOMY WITH SIGMOID COLECTOMY, COLOSTOMY;  Surgeon: Darnell Level, MD;  Location: [facility] ORS;  Service: General;  Laterality: N/A;   LEFT HEART CATH AND CORONARY ANGIOGRAPHY N/A 11/03/2016  Procedure: Left Heart Cath and Coronary Angiography;  Surgeon: Kathleene Hazel, MD;  Location: [facility] INVASIVE CV LAB;  Service: Cardiovascular;  Laterality: N/A;   MELANOMA EXCISION     PARTIAL COLECTOMY Right 08/18/2016   Procedure: RIGHT COLECTOMY;  Surgeon: Darnell Level, MD;  Location: [facility] ORS;  Service: General;  Laterality: Right;   TEE WITHOUT CARDIOVERSION N/A 06/08/2017   Procedure: TRANSESOPHAGEAL ECHOCARDIOGRAM (TEE);  Surgeon: Alleen Borne, MD;  Location: [facility] OR;  Service: Open Heart Surgery;  Laterality:  N/A;    Family History  Problem Relation Age of Onset   Dementia Mother    Kidney disease Mother    Vascular Disease Mother    CAD Mother    Heart attack Father    Alcohol abuse Father    Cirrhosis Father    Stroke Maternal Grandmother    Stroke Paternal Grandmother     Social History: Married. Used to be a Teacher, early years/pre. He reports that he has never smoked. He has never used smokeless tobacco. He reports current alcohol use of about 21.0 - 24.0 standard drinks of alcohol per week. He reports that he does not use drugs.   Allergies: No Known Allergies   Medications Prior to Admission  Medication Sig Dispense Refill   Carboxymethylcellul-Glycerin (CLEAR EYES FOR DRY EYES OP) Place 1 Drop/kg into both eyes as needed (Severe dry eye).     diltiazem (CARDIZEM CD) 120 MG 24 hr capsule TAKE 1 CAPSULE BY MOUTH DAILY (Patient taking differently: Take 120 mg by mouth at bedtime.) 90 capsule 2   doxycycline (VIBRAMYCIN) 50 MG capsule Take 50 mg by mouth at bedtime.     ezetimibe (ZETIA) 10 MG tablet TAKE 1 TABLET BY MOUTH DAILY (Patient taking differently: Take 10 mg by mouth at bedtime.) 90 tablet 1   famotidine (PEPCID) 20 MG tablet Take 20 mg by mouth daily as needed for heartburn or indigestion.     levothyroxine (SYNTHROID) 100 MCG tablet Take 1 tablet (100 mcg total) by mouth daily. 90 tablet 1   losartan (COZAAR) 100 MG tablet TAKE 1 TABLET BY MOUTH DAILY (Patient taking differently: Take 100 mg by mouth at bedtime.) 90 tablet 3   pravastatin (PRAVACHOL) 20 MG tablet Take 1 tablet (20 mg total) by mouth daily. (Patient taking differently: Take 20 mg by mouth at bedtime.) 90 tablet 3   rOPINIRole (REQUIP) 0.25 MG tablet Take 3 tablets (0.75 mg total) by mouth at bedtime. 270 tablet 3   XIIDRA 5 % SOLN Place 1 Dose into both eyes 2 (two) times daily.     [DISCONTINUED] aspirin EC 81 MG tablet Take 81 mg by mouth at bedtime.     FLUoxetine (PROZAC) 10 MG capsule Take 1 capsule (10 mg total)  by mouth daily. 30 capsule 3      Home: Home Living Family/patient expects to be discharged to:: Private residence Living Arrangements: Spouse/significant other Available Help at Discharge: Family, Available 24 hours/day Type of Home: House Home Access: Ramped entrance Home Layout: Two level, Bed/bath upstairs (could be downstairs with 1/2 bath and  a twin bed in living room) Alternate Level Stairs-Number of Steps: flight Bathroom Shower/Tub: Health visitor: Handicapped height Bathroom Accessibility: Yes Home Equipment: Information systems manager, BSC/3in1, Agricultural consultant (2 wheels) (can get 3n1)  Lives With: Spouse   Functional History: Prior Function Prior Level of Function : Independent/Modified Independent Mobility Comments: ind ADLs Comments: ind  Functional Status:  Mobility: Bed Mobility Overal bed mobility: Needs Assistance Bed  Mobility: Supine to Sit Supine to sit: Min assist, HOB elevated, Used rails General bed mobility comments: MinA to guide R LE off EOB. Increased time and effort Transfers Overall transfer level: Needs assistance Equipment used: Rolling walker (2 wheels) Transfers: Sit to/from Stand Sit to Stand: Contact guard assist General transfer comment: CGA ambulation to bathroom Ambulation/Gait Ambulation/Gait assistance: Contact guard assist Gait Distance (Feet): 20 Feet Assistive device: Rolling walker (2 wheels) Gait Pattern/deviations: Step-to pattern (hop-to) General Gait Details: CGA for safety, pt switches between keeping R LE NWB and using step to gait pattern to adhere to RLE PWB    ADL: ADL Overall ADL's : Needs assistance/impaired Eating/Feeding: Set up Grooming: Set up, Sitting Upper Body Bathing: Minimal assistance, Sitting Lower Body Bathing: Moderate assistance Upper Body Dressing : Minimal assistance Lower Body Dressing: Minimal assistance, Sitting/lateral leans Lower Body Dressing Details (indicate cue type and reason):  Simulated in room with reacher and sock aid Toilet Transfer: Contact guard assist, Ambulation, BSC/3in1, Rolling walker (2 wheels) Toileting- Clothing Manipulation and Hygiene: Contact guard assist Functional mobility during ADLs: Contact guard assist, Rolling walker (2 wheels)  Cognition: Cognition Orientation Level: Oriented X4 Cognition Arousal: Alert Behavior During Therapy: WFL for tasks assessed/performed  Physical Exam: Blood pressure 120/67, pulse 77, temperature 97.6 F (36.4 C), temperature source Oral, resp. rate 16, height 5\' 6"  (1.676 m), weight 81.6 kg, SpO2 98%. Physical Exam Vitals and nursing note reviewed.  Constitutional:      General: He is not in acute distress.    Appearance: Normal appearance. He is obese. He is not ill-appearing.     Comments: Sitting up in bed- BMI 29; awake, alert, appropriate, NAD  HENT:     Head: Normocephalic and atraumatic.     Right Ear: External ear normal.     Left Ear: External ear normal.     Nose: Nose normal. No congestion.     Mouth/Throat:     Mouth: Mucous membranes are dry.     Pharynx: Oropharynx is clear. No oropharyngeal exudate.  Eyes:     General:        Right eye: No discharge.        Left eye: No discharge.     Extraocular Movements: Extraocular movements intact.  Cardiovascular:     Rate and Rhythm: Normal rate and regular rhythm.     Heart sounds: Murmur heard.     No gallop.  Pulmonary:     Effort: Pulmonary effort is normal. No respiratory distress.     Breath sounds: Normal breath sounds. No wheezing, rhonchi or rales.  Abdominal:     Palpations: Abdomen is soft.     Comments: Small umbilical hernia. Protuberant vs distended- normoactive BS  Musculoskeletal:     Cervical back: Neck supple. No tenderness.     Comments: 2 + edema right thigh, knee and foot. Three surgical dressing in place on right hip/thigh UE strength 5/5 in B/L Ue's  LLE- 5/5 in HF, KE, KF, DF and PF RLE_ HF 2-/5; KE/KF 2-/5;  DF/PF 5/5  Skin:    General: Skin is warm and dry.  Neurological:     Mental Status: He is alert and oriented to person, place, and time.     Comments: Intact to light touch in all 4 extremities  Psychiatric:        Mood and Affect: Mood normal.        Behavior: Behavior normal.     Results for orders placed or performed during  the hospital encounter of 11/03/23 (from the past 48 hours)  CBC     Status: Abnormal   Collection Time: 11/06/23  6:29 AM  Result Value Ref Range   WBC 8.8 4.0 - 10.5 K/uL   RBC 3.19 (L) 4.22 - 5.81 MIL/uL   Hemoglobin 10.7 (L) 13.0 - 17.0 g/dL   HCT 78.4 (L) 69.6 - 29.5 %   MCV 96.9 80.0 - 100.0 fL   MCH 33.5 26.0 - 34.0 pg   MCHC 34.6 30.0 - 36.0 g/dL   RDW 28.4 13.2 - 44.0 %   Platelets 155 150 - 400 K/uL   nRBC 0.0 0.0 - 0.2 %    Comment: Performed at [facility], [address], [place], [place] [address]  Basic metabolic panel     Status: Abnormal   Collection Time: 11/06/23  6:29 AM  Result Value Ref Range   Sodium 137 135 - 145 mmol/L   Potassium 4.0 3.5 - 5.1 mmol/L   Chloride 103 98 - 111 mmol/L   CO2 28 22 - 32 mmol/L   Glucose, Bld 112 (H) 70 - 99 mg/dL    Comment: Glucose reference range applies only to samples taken after fasting for at least 8 hours.   BUN 15 8 - 23 mg/dL   Creatinine, Ser 5.36 0.61 - 1.24 mg/dL   Calcium 8.4 (L) 8.9 - 10.3 mg/dL   GFR, Estimated >64 >40 mL/min    Comment: (NOTE) Calculated using the CKD-EPI Creatinine Equation (2021)    Anion gap 6 5 - 15    Comment: Performed at [facility], [address], [place], [place] [address]  Comprehensive metabolic panel     Status: Abnormal   Collection Time: 11/07/23  6:13 AM  Result Value Ref Range   Sodium 135 135 - 145 mmol/L   Potassium 4.4 3.5 - 5.1 mmol/L   Chloride 102 98 - 111 mmol/L   CO2 27 22 - 32 mmol/L   Glucose, Bld 111 (H) 70 - 99 mg/dL    Comment: Glucose reference range applies only to samples taken after fasting for at least 8  hours.   BUN 17 8 - 23 mg/dL   Creatinine, Ser 5.95 0.61 - 1.24 mg/dL   Calcium 8.4 (L) 8.9 - 10.3 mg/dL   Total Protein 4.9 (L) 6.5 - 8.1 g/dL   Albumin 2.3 (L) 3.5 - 5.0 g/dL   AST 70 (H) 15 - 41 U/L   ALT 20 0 - 44 U/L   Alkaline Phosphatase 39 38 - 126 U/L   Total Bilirubin 0.9 0.0 - 1.2 mg/dL   GFR, Estimated >63 >87 mL/min    Comment: (NOTE) Calculated using the CKD-EPI Creatinine Equation (2021)    Anion gap 6 5 - 15    Comment: Performed at [facility], [address] [place], [place] [address]  CBC     Status: Abnormal   Collection Time: 11/07/23  6:13 AM  Result Value Ref Range   WBC 6.8 4.0 - 10.5 K/uL   RBC 3.13 (L) 4.22 - 5.81 MIL/uL   Hemoglobin 10.4 (L) 13.0 - 17.0 g/dL   HCT 29.5 (L) 18.8 - 41.6 %   MCV 95.5 80.0 - 100.0 fL   MCH 33.2 26.0 - 34.0 pg   MCHC 34.8 30.0 - 36.0 g/dL   RDW 60.6 30.1 - 60.1 %   Platelets 184 150 - 400 K/uL   nRBC 0.0 0.0 - 0.2 %  Comment: Performed at [facility], [address], [place], [place] [address]   No results found.    Blood pressure 120/67, pulse 77, temperature 97.6 F (36.4 C), temperature source Oral, resp. rate 16, height 5\' 6"  (1.676 m), weight 81.6 kg, SpO2 98%.  Medical Problem List and Plan: 1. Functional deficits secondary to R femur fx s/p IM nail- 25% PWB x 2 weeks, then 50 % for 2 weeks, then WBAT  -patient may  shower- cover incision  -ELOS/Goals: 7-10 days mod I  Admit to CIR 2.  Antithrombotics: -DVT/anticoagulation:  Pharmaceutical: Lovenox added  -antiplatelet therapy: ASA daily-->increase to BID at discharge.  3. Pain Management:  Currently dilaudid 1 mg IV qid w/ oxycodone 5 mg qid. --increase oxycodone to 5-10 mg and premedicate prior to therapy- stop IV Dilaudid --ice prn. Flexeril prn for muscle spasms.  4. Mood/Behavior/Sleep: Monitor sleep hygiene. Continue delirium precautions  --Repuip at nights for RLS. Trazodone prn for sleep.  -antipsychotic agents: N/A 5.  Neuropsych/cognition: This patient is capable of making decisions on his own behalf. 6. Skin/Wound Care: Routine pressure relief measures.  7. Fluids/Electrolytes/Nutrition: Monitor I/O. Check CMET in am.  8. IM nail Right femur: 25% WB thorough 03/07  --followed by 50% WB X 2 weeks then WBAT 9. HTN: Monitor BP TID--on Cozaar and Cardizem.  10. Non-obstructive CAD w/recovered EF: Continue Losartan, ASA and statin.  11.  Abnormal LFTs: Recheck labs in am 12. ABLA: Recheck CBC in am 13. Constipation: Will schedule Senna with supper. Doesn't want Sorbitol  14. RLE edema- 2+ from R hip to R foot- monitor- might need dopplers     Jacquelynn Cree, PA-C 11/07/2023  I have personally performed a face to face diagnostic evaluation of this patient and formulated the key components of the plan.  Additionally, I have personally reviewed laboratory data, imaging studies, as well as relevant notes and concur with the physician assistant's documentation above.   The patient's status has not changed from the original H&P.  Any changes in documentation from the acute care chart have been noted above.

## 2023-11-07 NOTE — PMR Pre-admission (Signed)
 PMR Admission Coordinator Pre-Admission Assessment  Patient: [name] Brown is an 73 y.o., male MRN: [id number] DOB: 1951/04/13 Height: 5\' 6"  (167.6 cm) Weight: 81.6 kg  Insurance Information HMO:     PPO:      PCP:      IPA:      80/20:  yes    OTHER:  PRIMARY: Medicare A and B      Policy#: [id number]  Subscriber:  CM Name:       Phone#:      Fax#:  Pre-Cert#: verified Health and safety inspector:  Benefits:  Phone #:      Name:  Eff. Date: part a 08/13/2015 Part B 09/13/2015    Deduct: $1632      Out of Pocket Max: n/a      Life Max: n/a CIR: 100%      SNF: 20 full days Outpatient:      Co-Pay:  Home Health: 100%      Co-Pay:  DME:      Co-Pay:  Providers:  SECONDARY: [employer] [place]      Policy#: [id number]      Phone#:   Financial Counselor:       Phone#:   The "Data Collection Information Summary" for patients in Inpatient Rehabilitation Facilities with attached "Privacy Act Statement-Health Care Records" was provided and verbally reviewed with: Patient  Emergency Contact Information Contact Information     Name Relation Home Work Mobile   [place] Spouse [phone]  [phone]      Other Contacts   None on File     Current Medical History  Patient Admitting Diagnosis: Hip fracture   History of Present Illness: Pt. Is a 73 y.o. male with medical history significant for hypertension, hyperlipidemia, hypothyroidism, nonischemic cardiomyopathy with recovered EF, nonobstructive CAD, aortic stenosis status post bioprosthetic valve replacement, and colon cancer status post right colectomy who presented to the [facility] hospital ED  11/03/23 with right hip pain after a mechanical fall at home. Upon arrival to the ED, patient is found to be afebrile and saturating well on room air with normal heart rate and elevated blood pressure.  Labs are most notable for normal creatinine, normal WBC, and normal hemoglobin.  Plain radiographs of the right femur and pelvis  demonstrates suspected nondisplaced intertrochanteric right hip fracture and right proximal femoral shaft fracture. Orthopedic surgery was consulted and Pt. Underwent IM nail 11/03/23. Ortho recs for lovenox daily until mobilizing then transition to ASA 81mg  bid. Pt. Will admit to CIR to assist return to PLOF.     Patient's medical record from [facility]  has been reviewed by the rehabilitation admission coordinator and physician.  Past Medical History  Past Medical History:  Diagnosis Date   Aortic stenosis    Ascending aortic aneurysm (HCC) 09/18/2018   Colon cancer (HCC)    Family history of adverse reaction to anesthesia    pt states his mother had 2 episodes of hyponatremia following anesthesia    GERD (gastroesophageal reflux disease)    Headache    Heart murmur    Hyperlipidemia 10/25/2016   Hypertension    pt is currently not taking any medications; pt states is borderline   Melanoma (HCC)    OSA on CPAP 09/18/2018   PAF (paroxysmal atrial fibrillation) (HCC) 06/13/2017   Perforated sigmoid colon (HCC)    PVC's (premature ventricular contractions) 10/25/2016   Tinnitus    Wears glasses     Has the  patient had major surgery during 100 days prior to admission? Yes  Family History   family history includes Alcohol abuse in his father; CAD in his mother; Cirrhosis in his father; Dementia in his mother; Heart attack in his father; Kidney disease in his mother; Stroke in his maternal grandmother and paternal grandmother; Vascular Disease in his mother.  Current Medications  Current Facility-Administered Medications:    acetaminophen (TYLENOL) tablet 650 mg, 650 mg, Oral, Q6H PRN, Opyd, Lavone Neri, MD, 650 mg at 11/07/23 0133   aspirin chewable tablet 81 mg, 81 mg, Oral, BID, Jerald Kief, MD   bisacodyl (DULCOLAX) EC tablet 5 mg, 5 mg, Oral, Daily PRN, Opyd, Lavone Neri, MD   calcium carbonate (TUMS - dosed in mg elemental calcium) chewable tablet 200 mg of  elemental calcium, 1 tablet, Oral, TID PRN, Jerald Kief, MD, 200 mg of elemental calcium at 11/04/23 1231   cyclobenzaprine (FLEXERIL) tablet 5 mg, 5 mg, Oral, TID PRN, Jerald Kief, MD, 5 mg at 11/07/23 4098   diltiazem (CARDIZEM CD) 24 hr capsule 120 mg, 120 mg, Oral, Daily, Opyd, Timothy S, MD, 120 mg at 11/06/23 2032   famotidine (PEPCID) tablet 20 mg, 20 mg, Oral, Daily, Jerald Kief, MD, 20 mg at 11/07/23 1191   feeding supplement (ENSURE ENLIVE / ENSURE PLUS) liquid 237 mL, 237 mL, Oral, BID BM, Jerald Kief, MD, 237 mL at 11/07/23 4782   folic acid (FOLVITE) tablet 1 mg, 1 mg, Oral, Daily, Jerald Kief, MD, 1 mg at 11/07/23 9562   HYDROmorphone (DILAUDID) injection 0.5-1 mg, 0.5-1 mg, Intravenous, Q2H PRN, Jerald Kief, MD, 1 mg at 11/07/23 1030   levothyroxine (SYNTHROID) tablet 100 mcg, 100 mcg, Oral, Daily, Opyd, Lavone Neri, MD, 100 mcg at 11/07/23 1308   multivitamin with minerals tablet 1 tablet, 1 tablet, Oral, Daily, Jerald Kief, MD, 1 tablet at 11/07/23 6578   oxyCODONE (Oxy IR/ROXICODONE) immediate release tablet 5 mg, 5 mg, Oral, Q4H PRN, Opyd, Lavone Neri, MD, 5 mg at 11/07/23 4696   pravastatin (PRAVACHOL) tablet 20 mg, 20 mg, Oral, Daily, Opyd, Lavone Neri, MD, 20 mg at 11/06/23 2031   prochlorperazine (COMPAZINE) injection 5 mg, 5 mg, Intravenous, Q6H PRN, Opyd, Lavone Neri, MD   rOPINIRole (REQUIP) tablet 0.75 mg, 0.75 mg, Oral, QHS, Opyd, Timothy S, MD, 0.75 mg at 11/06/23 2032   senna-docusate (Senokot-S) tablet 1 tablet, 1 tablet, Oral, QHS PRN, Opyd, Lavone Neri, MD, 1 tablet at 11/06/23 2031   thiamine (VITAMIN B1) tablet 100 mg, 100 mg, Oral, Daily, Jerald Kief, MD, 100 mg at 11/07/23 2952  Patients Current Diet:  Diet Order             Diet regular Room service appropriate? Yes; Fluid consistency: Thin  Diet effective now                   Precautions / Restrictions Precautions Precautions: Fall Restrictions Weight Bearing Restrictions  Per Provider Order: Yes RLE Weight Bearing Per Provider Order: Partial weight bearing RLE Partial Weight Bearing Percentage or Pounds: 25%   Has the patient had 2 or more falls or a fall with injury in the past year? Yes  Prior Activity Level   Pt.active in the community PTA Prior Functional Level Self Care: Did the patient need help bathing, dressing, using the toilet or eating? Independent  Indoor Mobility: Did the patient need assistance with walking from room to room (with or without device)?  Independent  Stairs: Did the patient need assistance with internal or external stairs (with or without device)? Independent  Functional Cognition: Did the patient need help planning regular tasks such as shopping or remembering to take medications? Independent  Patient Information Are you of Hispanic, Latino/a,or Spanish origin?: A. No, not of Hispanic, Latino/a, or Spanish origin What is your race?: A. White Do you need or want an interpreter to communicate with a doctor or health care staff?: 0. No  Patient's Response To:  Health Literacy and Transportation Is the patient able to respond to health literacy and transportation needs?: Yes Health Literacy - How often do you need to have someone help you when you read instructions, pamphlets, or other written material from your doctor or pharmacy?: Never In the past 12 months, has lack of transportation kept you from medical appointments or from getting medications?: No In the past 12 months, has lack of transportation kept you from meetings, work, or from getting things needed for daily living?: No  Journalist, newspaper / Equipment Home Equipment: Shower seat, BSC/3in1, Agricultural consultant (2 wheels) (can get 3n1)  Prior Device Use: Indicate devices/aids used by the patient prior to current illness, exacerbation or injury? None of the above  Current Functional Level Cognition  Orientation Level: Oriented X4    Extremity  Assessment (includes Sensation/Coordination)  Upper Extremity Assessment: Overall WFL for tasks assessed  Lower Extremity Assessment: Defer to PT evaluation RLE Deficits / Details: alert to light touch, unable to SLR or hip ABD/ADD    ADLs  Overall ADL's : Needs assistance/impaired Eating/Feeding: Set up Grooming: Set up, Sitting Upper Body Bathing: Minimal assistance, Sitting Lower Body Bathing: Moderate assistance Upper Body Dressing : Minimal assistance Lower Body Dressing: Minimal assistance, Sitting/lateral leans Lower Body Dressing Details (indicate cue type and reason): Simulated in room with reacher and sock aid Toilet Transfer: Contact guard assist, Ambulation, BSC/3in1, Rolling walker (2 wheels) Toileting- Clothing Manipulation and Hygiene: Contact guard assist Functional mobility during ADLs: Contact guard assist, Rolling walker (2 wheels)    Mobility  Overal bed mobility: Needs Assistance Bed Mobility: Supine to Sit Supine to sit: Min assist, HOB elevated, Used rails General bed mobility comments: MinA to guide R LE off EOB. Increased time and effort    Transfers  Overall transfer level: Needs assistance Equipment used: Rolling walker (2 wheels) Transfers: Sit to/from Stand Sit to Stand: Contact guard assist General transfer comment: CGA ambulation to bathroom    Ambulation / Gait / Stairs / Wheelchair Mobility  Ambulation/Gait Ambulation/Gait assistance: Contact guard assist Gait Distance (Feet): 20 Feet Assistive device: Rolling walker (2 wheels) Gait Pattern/deviations: Step-to pattern (hop-to) General Gait Details: CGA for safety, pt switches between keeping R LE NWB and using step to gait pattern to adhere to RLE PWB    Posture / Balance Dynamic Sitting Balance Sitting balance - Comments: pt leans to the left to avoid pain in R hip Balance Overall balance assessment: Mild deficits observed, not formally tested, Needs assistance Sitting-balance support: No  upper extremity supported, Feet supported Sitting balance-Leahy Scale: Good Sitting balance - Comments: pt leans to the left to avoid pain in R hip Postural control: Left lateral lean Standing balance support: Bilateral upper extremity supported, During functional activity, Reliant on assistive device for balance Standing balance-Leahy Scale: Fair Standing balance comment: reliant on RW    Special needs/care consideration Special service needs none   Previous Home Environment (from acute therapy documentation) Living Arrangements: Spouse/significant other  Lives With:  Spouse Available Help at Discharge: Family, Available 24 hours/day Type of Home: House Home Layout: Two level, Bed/bath upstairs (could be downstairs with 1/2 bath and  a twin bed in living room) Alternate Level Stairs-Number of Steps: flight Home Access: Ramped entrance Bathroom Shower/Tub: Health visitor: Handicapped height Bathroom Accessibility: Yes How Accessible: Accessible via wheelchair, Accessible via walker Home Care Services: No  Discharge Living Setting Plans for Discharge Living Setting: Patient's home Type of Home at Discharge: House Discharge Home Layout: Two level, 1/2 bath on main level, Able to live on main level with bedroom/bathroom Alternate Level Stairs-Rails: Right Alternate Level Stairs-Number of Steps: flight Discharge Home Access: Ramped entrance Entrance Stairs-Rails: Right Entrance Stairs-Number of Steps: 2 Discharge Bathroom Shower/Tub: Walk-in shower Discharge Bathroom Toilet: Handicapped height Discharge Bathroom Accessibility: Yes Does the patient have any problems obtaining your medications?: No  Social/Family/Support Systems Patient Roles: Spouse Contact Information: [phone] Anticipated Caregiver: Rissa Ability/Limitations of Caregiver: Min A Caregiver Availability: 24/7 Discharge Plan Discussed with Primary Caregiver: Yes Is Caregiver In Agreement with  Plan?: Yes Does Caregiver/Family have Issues with Lodging/Transportation while Pt is in Rehab?: No  Goals Patient/Family Goal for Rehab: PT/OT Mod I Expected length of stay: 7-10 days Pt/Family Agrees to Admission and willing to participate: Yes Program Orientation Provided & Reviewed with Pt/Caregiver Including Roles  & Responsibilities: Yes  Decrease burden of Care through IP rehab admission: not anticipated   Possible need for SNF placement upon discharge: not anticipated   Patient Condition: I have reviewed medical records from [facility], spoken with CM, and patient and spouse. I met with patient at the bedside for inpatient rehabilitation assessment.  Patient will benefit from ongoing PT and OT, can actively participate in 3 hours of therapy a day 5 days of the week, and can make measurable gains during the admission.  Patient will also benefit from the coordinated team approach during an Inpatient Acute Rehabilitation admission.  The patient will receive intensive therapy as well as Rehabilitation physician, nursing, social worker, and care management interventions.  Due to safety, skin/wound care, disease management, medication administration, pain management, and patient education the patient requires 24 hour a day rehabilitation nursing.  The patient is currently min A with mobility and basic ADLs.  Discharge setting and therapy post discharge at home with home health is anticipated.  Patient has agreed to participate in the Acute Inpatient Rehabilitation Program and will admit today.  Preadmission Screen Completed By:  Jeronimo Greaves, 11/07/2023 11:48 AM ______________________________________________________________________   Discussed status with Dr. Berline Chough  on 11/07/23 at 900 and received approval for admission today.  Admission Coordinator:  Jeronimo Greaves, CCC-SLP, time 1155/Date 11/07/23   Assessment/Plan: Diagnosis: R hip fx s/p IM nail PWB 25% Does the need for  close, 24 hr/day Medical supervision in concert with the patient's rehab needs make it unreasonable for this patient to be served in a less intensive setting? Yes Co-Morbidities requiring supervision/potential complications: HTN, CAD, CMO' HLD, hypothyroid, severe post op pain; R colectomy;  Due to bladder management, bowel management, safety, skin/wound care, disease management, medication administration, pain management, and patient education, does the patient require 24 hr/day rehab nursing? Yes Does the patient require coordinated care of a physician, rehab nurse, PT, OT, and SLP to address physical and functional deficits in the context of the above medical diagnosis(es)? Yes Addressing deficits in the following areas: balance, endurance, locomotion, strength, transferring, bowel/bladder control, bathing, dressing, feeding, grooming, and toileting Can the patient  actively participate in an intensive therapy program of at least 3 hrs of therapy 5 days a week? Yes The potential for patient to make measurable gains while on inpatient rehab is good Anticipated functional outcomes upon discharge from inpatient rehab: modified independent and supervision PT, modified independent and supervision OT, n/a SLP Estimated rehab length of stay to reach the above functional goals is: 7-10 days Anticipated discharge destination: Home 10. Overall Rehab/Functional Prognosis: good   MD Signature:

## 2023-11-07 NOTE — H&P (Signed)
 Physical Medicine and Rehabilitation Admission H&P        Chief Complaint  Patient presents with   Functional deficits due to fall with R femur Fx      HPI: [name] Brown is a 73 year old male with history of HTN, hypothyroid, non-obstructive CAD, AV replacement, colon CA s/p right colectomy who was admitted on 11/03/23 after stepping off ramp with fall onto right hip and immediate onset of right hip pain. He was found to have right femur cephalomedullary Fx and underwent IM nail on the same day by Dr. Hulda Humphrey. Post op to be 25% WB X 2 weeks followed by 50% WB X 2 weeks then WBAT. Post op reported penile numbness as well as thigh pain. .    Reactive leucocytosis and thrombocytopenia has resolved. ABLA being monitored. Started on ASA bid for DVT prophylaxis today. PT/OT has been working with patient who requires CGA to min assist with mobility and ADLs. CIR recommended due to functional decline.    Pt reports pain 5.5/10 currently, but can spike to 9/10 with movement/therapy.  Taking Oxy q4 hours or so.  Doesn't like Sorbitol (is a retired Teacher, early years/pre) Would rather take Senokot- a few tabs to go.  LBM 3-4 days ago- feels constipated   Review of Systems  Constitutional:  Negative for fever.  HENT:  Negative for hearing loss.   Eyes:  Negative for blurred vision.       Dry eyes  Respiratory:  Negative for cough and shortness of breath.   Cardiovascular:  Negative for chest pain.  Gastrointestinal:  Positive for constipation and heartburn. Negative for abdominal pain and diarrhea.  Genitourinary:  Negative for frequency and urgency.  Musculoskeletal:  Positive for joint pain.  Skin:  Negative for rash.  Neurological:  Positive for weakness.  Psychiatric/Behavioral:  The patient has insomnia.   All other systems reviewed and are negative.           Past Medical History:  Diagnosis Date   Aortic stenosis     Ascending aortic aneurysm (HCC) 09/18/2018   Colon cancer (HCC)      Family history of adverse reaction to anesthesia      pt states his mother had 2 episodes of hyponatremia following anesthesia    GERD (gastroesophageal reflux disease)     Headache     Heart murmur     Hyperlipidemia 10/25/2016   Hypertension      pt is currently not taking any medications; pt states is borderline   Melanoma (HCC)     OSA on CPAP 09/18/2018   PAF (paroxysmal atrial fibrillation) (HCC) 06/13/2017   Perforated sigmoid colon (HCC)     PVC's (premature ventricular contractions) 10/25/2016   Tinnitus     Wears glasses                 Past Surgical History:  Procedure Laterality Date   AORTIC VALVE REPLACEMENT N/A 06/08/2017    Procedure: AORTIC VALVE REPLACEMENT (AVR);  Surgeon: Alleen Borne, MD;  Location: [facility] OR;  Service: Open Heart Surgery;  Laterality: N/A;  Using 25mm Perimount Magna Ease Pericardial Bioprosthesis Aortic Valve   COLOSTOMY CLOSURE N/A 08/18/2016    Procedure: COLOSTOMY CLOSURE OPEN PROCEDURE;  Surgeon: Darnell Level, MD;  Location: [facility] ORS;  Service: General;  Laterality: N/A;   FEMUR IM NAIL Right 11/03/2023    Procedure: INTRAMEDULLARY (IM) NAIL FEMORAL;  Surgeon: Luci Bank, MD;  Location: [facility] OR;  Service:  Orthopedics;  Laterality: Right;   LAPAROTOMY N/A 04/25/2016    Procedure: EXPLORATORY LAPAROTOMY WITH SIGMOID COLECTOMY, COLOSTOMY;  Surgeon: Darnell Level, MD;  Location: [facility] ORS;  Service: General;  Laterality: N/A;   LEFT HEART CATH AND CORONARY ANGIOGRAPHY N/A 11/03/2016    Procedure: Left Heart Cath and Coronary Angiography;  Surgeon: Kathleene Hazel, MD;  Location: [facility] INVASIVE CV LAB;  Service: Cardiovascular;  Laterality: N/A;   MELANOMA EXCISION       PARTIAL COLECTOMY Right 08/18/2016    Procedure: RIGHT COLECTOMY;  Surgeon: Darnell Level, MD;  Location: [facility] ORS;  Service: General;  Laterality: Right;   TEE WITHOUT CARDIOVERSION N/A 06/08/2017    Procedure: TRANSESOPHAGEAL ECHOCARDIOGRAM (TEE);  Surgeon: Alleen Borne, MD;   Location: [facility] OR;  Service: Open Heart Surgery;  Laterality: N/A;               Family History  Problem Relation Age of Onset   Dementia Mother     Kidney disease Mother     Vascular Disease Mother     CAD Mother     Heart attack Father     Alcohol abuse Father     Cirrhosis Father     Stroke Maternal Grandmother     Stroke Paternal Grandmother            Social History: Married. Used to be a Teacher, early years/pre. He reports that he has never smoked. He has never used smokeless tobacco. He reports current alcohol use of about 21.0 - 24.0 standard drinks of alcohol per week. He reports that he does not use drugs.     Allergies:  Allergies  No Known Allergies             Medications Prior to Admission  Medication Sig Dispense Refill   Carboxymethylcellul-Glycerin (CLEAR EYES FOR DRY EYES OP) Place 1 Drop/kg into both eyes as needed (Severe dry eye).       diltiazem (CARDIZEM CD) 120 MG 24 hr capsule TAKE 1 CAPSULE BY MOUTH DAILY (Patient taking differently: Take 120 mg by mouth at bedtime.) 90 capsule 2   doxycycline (VIBRAMYCIN) 50 MG capsule Take 50 mg by mouth at bedtime.       ezetimibe (ZETIA) 10 MG tablet TAKE 1 TABLET BY MOUTH DAILY (Patient taking differently: Take 10 mg by mouth at bedtime.) 90 tablet 1   famotidine (PEPCID) 20 MG tablet Take 20 mg by mouth daily as needed for heartburn or indigestion.       levothyroxine (SYNTHROID) 100 MCG tablet Take 1 tablet (100 mcg total) by mouth daily. 90 tablet 1   losartan (COZAAR) 100 MG tablet TAKE 1 TABLET BY MOUTH DAILY (Patient taking differently: Take 100 mg by mouth at bedtime.) 90 tablet 3   pravastatin (PRAVACHOL) 20 MG tablet Take 1 tablet (20 mg total) by mouth daily. (Patient taking differently: Take 20 mg by mouth at bedtime.) 90 tablet 3   rOPINIRole (REQUIP) 0.25 MG tablet Take 3 tablets (0.75 mg total) by mouth at bedtime. 270 tablet 3   XIIDRA 5 % SOLN Place 1 Dose into both eyes 2 (two) times daily.        [DISCONTINUED] aspirin EC 81 MG tablet Take 81 mg by mouth at bedtime.       FLUoxetine (PROZAC) 10 MG capsule Take 1 capsule (10 mg total) by mouth daily. 30 capsule 3              Home: Home Living Family/patient expects to  be discharged to:: Private residence Living Arrangements: Spouse/significant other Available Help at Discharge: Family, Available 24 hours/day Type of Home: House Home Access: Ramped entrance Home Layout: Two level, Bed/bath upstairs (could be downstairs with 1/2 bath and  a twin bed in living room) Alternate Level Stairs-Number of Steps: flight Bathroom Shower/Tub: Health visitor: Handicapped height Bathroom Accessibility: Yes Home Equipment: Information systems manager, BSC/3in1, Agricultural consultant (2 wheels) (can get 3n1)  Lives With: Spouse   Functional History: Prior Function Prior Level of Function : Independent/Modified Independent Mobility Comments: ind ADLs Comments: ind   Functional Status:  Mobility: Bed Mobility Overal bed mobility: Needs Assistance Bed Mobility: Supine to Sit Supine to sit: Min assist, HOB elevated, Used rails General bed mobility comments: MinA to guide R LE off EOB. Increased time and effort Transfers Overall transfer level: Needs assistance Equipment used: Rolling walker (2 wheels) Transfers: Sit to/from Stand Sit to Stand: Contact guard assist General transfer comment: CGA ambulation to bathroom Ambulation/Gait Ambulation/Gait assistance: Contact guard assist Gait Distance (Feet): 20 Feet Assistive device: Rolling walker (2 wheels) Gait Pattern/deviations: Step-to pattern (hop-to) General Gait Details: CGA for safety, pt switches between keeping R LE NWB and using step to gait pattern to adhere to RLE PWB   ADL: ADL Overall ADL's : Needs assistance/impaired Eating/Feeding: Set up Grooming: Set up, Sitting Upper Body Bathing: Minimal assistance, Sitting Lower Body Bathing: Moderate assistance Upper Body  Dressing : Minimal assistance Lower Body Dressing: Minimal assistance, Sitting/lateral leans Lower Body Dressing Details (indicate cue type and reason): Simulated in room with reacher and sock aid Toilet Transfer: Contact guard assist, Ambulation, BSC/3in1, Rolling walker (2 wheels) Toileting- Clothing Manipulation and Hygiene: Contact guard assist Functional mobility during ADLs: Contact guard assist, Rolling walker (2 wheels)   Cognition: Cognition Orientation Level: Oriented X4 Cognition Arousal: Alert Behavior During Therapy: WFL for tasks assessed/performed   Physical Exam: Blood pressure 120/67, pulse 77, temperature 97.6 F (36.4 C), temperature source Oral, resp. rate 16, height 5\' 6"  (1.676 m), weight 81.6 kg, SpO2 98%. Physical Exam Vitals and nursing note reviewed.  Constitutional:      General: He is not in acute distress.    Appearance: Normal appearance. He is obese. He is not ill-appearing.     Comments: Sitting up in bed- BMI 29; awake, alert, appropriate, NAD  HENT:     Head: Normocephalic and atraumatic.     Right Ear: External ear normal.     Left Ear: External ear normal.     Nose: Nose normal. No congestion.     Mouth/Throat:     Mouth: Mucous membranes are dry.     Pharynx: Oropharynx is clear. No oropharyngeal exudate.  Eyes:     General:        Right eye: No discharge.        Left eye: No discharge.     Extraocular Movements: Extraocular movements intact.  Cardiovascular:     Rate and Rhythm: Normal rate and regular rhythm.     Heart sounds: Murmur heard.     No gallop.  Pulmonary:     Effort: Pulmonary effort is normal. No respiratory distress.     Breath sounds: Normal breath sounds. No wheezing, rhonchi or rales.  Abdominal:     Palpations: Abdomen is soft.     Comments: Small umbilical hernia. Protuberant vs distended- normoactive BS  Musculoskeletal:     Cervical back: Neck supple. No tenderness.     Comments: 2 + edema right thigh,  knee  and foot. Three surgical dressing in place on right hip/thigh UE strength 5/5 in B/L Ue's  LLE- 5/5 in HF, KE, KF, DF and PF RLE_ HF 2-/5; KE/KF 2-/5; DF/PF 5/5  Skin:    General: Skin is warm and dry.  Neurological:     Mental Status: He is alert and oriented to person, place, and time.     Comments: Intact to light touch in all 4 extremities  Psychiatric:        Mood and Affect: Mood normal.        Behavior: Behavior normal.        Lab Results Last 48 Hours        Results for orders placed or performed during the hospital encounter of 11/03/23 (from the past 48 hours)  CBC     Status: Abnormal    Collection Time: 11/06/23  6:29 AM  Result Value Ref Range    WBC 8.8 4.0 - 10.5 K/uL    RBC 3.19 (L) 4.22 - 5.81 MIL/uL    Hemoglobin 10.7 (L) 13.0 - 17.0 g/dL    HCT 16.1 (L) 09.6 - 52.0 %    MCV 96.9 80.0 - 100.0 fL    MCH 33.5 26.0 - 34.0 pg    MCHC 34.6 30.0 - 36.0 g/dL    RDW 04.5 40.9 - 81.1 %    Platelets 155 150 - 400 K/uL    nRBC 0.0 0.0 - 0.2 %      Comment: Performed at [facility], [address], [place], [place] [address]  Basic metabolic panel     Status: Abnormal    Collection Time: 11/06/23  6:29 AM  Result Value Ref Range    Sodium 137 135 - 145 mmol/L    Potassium 4.0 3.5 - 5.1 mmol/L    Chloride 103 98 - 111 mmol/L    CO2 28 22 - 32 mmol/L    Glucose, Bld 112 (H) 70 - 99 mg/dL      Comment: Glucose reference range applies only to samples taken after fasting for at least 8 hours.    BUN 15 8 - 23 mg/dL    Creatinine, Ser 2.95 0.61 - 1.24 mg/dL    Calcium 8.4 (L) 8.9 - 10.3 mg/dL    GFR, Estimated >62 >13 mL/min      Comment: (NOTE) Calculated using the CKD-EPI Creatinine Equation (2021)      Anion gap 6 5 - 15      Comment: Performed at [facility], [address], [place], [place] [address]  Comprehensive metabolic panel     Status: Abnormal    Collection Time: 11/07/23  6:13 AM  Result Value Ref Range    Sodium 135 135 - 145 mmol/L     Potassium 4.4 3.5 - 5.1 mmol/L    Chloride 102 98 - 111 mmol/L    CO2 27 22 - 32 mmol/L    Glucose, Bld 111 (H) 70 - 99 mg/dL      Comment: Glucose reference range applies only to samples taken after fasting for at least 8 hours.    BUN 17 8 - 23 mg/dL    Creatinine, Ser 8.46 0.61 - 1.24 mg/dL    Calcium 8.4 (L) 8.9 - 10.3 mg/dL    Total Protein 4.9 (L) 6.5 - 8.1 g/dL    Albumin 2.3 (L) 3.5 - 5.0 g/dL    AST 70 (H) 15 - 41 U/L  ALT 20 0 - 44 U/L    Alkaline Phosphatase 39 38 - 126 U/L    Total Bilirubin 0.9 0.0 - 1.2 mg/dL    GFR, Estimated >16 >10 mL/min      Comment: (NOTE) Calculated using the CKD-EPI Creatinine Equation (2021)      Anion gap 6 5 - 15      Comment: Performed at [facility], [address], [place], [place] [address]  CBC     Status: Abnormal    Collection Time: 11/07/23  6:13 AM  Result Value Ref Range    WBC 6.8 4.0 - 10.5 K/uL    RBC 3.13 (L) 4.22 - 5.81 MIL/uL    Hemoglobin 10.4 (L) 13.0 - 17.0 g/dL    HCT 40.9 (L) 81.1 - 52.0 %    MCV 95.5 80.0 - 100.0 fL    MCH 33.2 26.0 - 34.0 pg    MCHC 34.8 30.0 - 36.0 g/dL    RDW 91.4 78.2 - 95.6 %    Platelets 184 150 - 400 K/uL    nRBC 0.0 0.0 - 0.2 %      Comment: Performed at [facility], [address] [place], [place] [address]      Imaging Results (Last 48 hours)  No results found.         Blood pressure 120/67, pulse 77, temperature 97.6 F (36.4 C), temperature source Oral, resp. rate 16, height 5\' 6"  (1.676 m), weight 81.6 kg, SpO2 98%.   Medical Problem List and Plan: 1. Functional deficits secondary to R femur fx s/p IM nail- 25% PWB x 2 weeks, then 50 % for 2 weeks, then WBAT             -patient may  shower- cover incision             -ELOS/Goals: 7-10 days mod I             Admit to CIR 2.  Antithrombotics: -DVT/anticoagulation:  Pharmaceutical: Lovenox added             -antiplatelet therapy: ASA daily-->increase to BID at discharge.  3. Pain Management:  Currently  dilaudid 1 mg IV qid w/ oxycodone 5 mg qid. --increase oxycodone to 5-10 mg and premedicate prior to therapy- stop IV Dilaudid --ice prn. Flexeril prn for muscle spasms.  4. Mood/Behavior/Sleep: Monitor sleep hygiene. Continue delirium precautions             --Repuip at nights for RLS. Trazodone prn for sleep.             -antipsychotic agents: N/A 5. Neuropsych/cognition: This patient is capable of making decisions on his own behalf. 6. Skin/Wound Care: Routine pressure relief measures.  7. Fluids/Electrolytes/Nutrition: Monitor I/O. Check CMET in am.  8. IM nail Right femur: 25% WB thorough 03/07  --followed by 50% WB X 2 weeks then WBAT 9. HTN: Monitor BP TID--on Cozaar and Cardizem.  10. Non-obstructive CAD w/recovered EF: Continue Losartan, ASA and statin.  11.  Abnormal LFTs: Recheck labs in am 12. ABLA: Recheck CBC in am 13. Constipation: Will schedule Senna with supper. Doesn't want Sorbitol  14. RLE edema- 2+ from R hip to R foot- monitor- might need dopplers         Jacquelynn Cree, PA-C 11/07/2023   I have personally performed a face to face diagnostic evaluation of this patient and formulated the key components of the plan.  Additionally, I have personally  reviewed laboratory data, imaging studies, as well as relevant notes and concur with the physician assistant's documentation above.   The patient's status has not changed from the original H&P.  Any changes in documentation from the acute care chart have been noted above.

## 2023-11-07 NOTE — Progress Notes (Signed)
 Upper extremity venous duplex completed. Please see CV Procedures for preliminary results.  Initial findings reported to Tori Milks, LPN.  Shona Simpson, RVT 11/07/23 3:25 PM

## 2023-11-07 NOTE — Progress Notes (Signed)
 Inpatient Rehabilitation Care Coordinator Assessment and Plan Patient Details  Name: [name] Brown MRN: [id number] Date of Birth: Jul 16, 1951  Today's Date: 11/07/2023  Hospital Problems: Principal Problem:   Fracture, proximal femur, right, sequela  Past Medical History:  Past Medical History:  Diagnosis Date   Aortic stenosis    Ascending aortic aneurysm (HCC) 09/18/2018   Colon cancer (HCC)    Family history of adverse reaction to anesthesia    pt states his mother had 2 episodes of hyponatremia following anesthesia    GERD (gastroesophageal reflux disease)    Headache    Heart murmur    Hyperlipidemia 10/25/2016   Hypertension    pt is currently not taking any medications; pt states is borderline   Melanoma (HCC)    OSA on CPAP 09/18/2018   PAF (paroxysmal atrial fibrillation) (HCC) 06/13/2017   Perforated sigmoid colon (HCC)    PVC's (premature ventricular contractions) 10/25/2016   Tinnitus    Wears glasses    Past Surgical History:  Past Surgical History:  Procedure Laterality Date   AORTIC VALVE REPLACEMENT N/A 06/08/2017   Procedure: AORTIC VALVE REPLACEMENT (AVR);  Surgeon: Alleen Borne, MD;  Location: [facility] OR;  Service: Open Heart Surgery;  Laterality: N/A;  Using 25mm Perimount Magna Ease Pericardial Bioprosthesis Aortic Valve   COLOSTOMY CLOSURE N/A 08/18/2016   Procedure: COLOSTOMY CLOSURE OPEN PROCEDURE;  Surgeon: Darnell Level, MD;  Location: [facility] ORS;  Service: General;  Laterality: N/A;   FEMUR IM NAIL Right 11/03/2023   Procedure: INTRAMEDULLARY (IM) NAIL FEMORAL;  Surgeon: Luci Bank, MD;  Location: [facility] OR;  Service: Orthopedics;  Laterality: Right;   LAPAROTOMY N/A 04/25/2016   Procedure: EXPLORATORY LAPAROTOMY WITH SIGMOID COLECTOMY, COLOSTOMY;  Surgeon: Darnell Level, MD;  Location: [facility] ORS;  Service: General;  Laterality: N/A;   LEFT HEART CATH AND CORONARY ANGIOGRAPHY N/A 11/03/2016   Procedure: Left Heart Cath and Coronary Angiography;  Surgeon:  Kathleene Hazel, MD;  Location: [facility] INVASIVE CV LAB;  Service: Cardiovascular;  Laterality: N/A;   MELANOMA EXCISION     PARTIAL COLECTOMY Right 08/18/2016   Procedure: RIGHT COLECTOMY;  Surgeon: Darnell Level, MD;  Location: [facility] ORS;  Service: General;  Laterality: Right;   TEE WITHOUT CARDIOVERSION N/A 06/08/2017   Procedure: TRANSESOPHAGEAL ECHOCARDIOGRAM (TEE);  Surgeon: Alleen Borne, MD;  Location: [facility] OR;  Service: Open Heart Surgery;  Laterality: N/A;   Social History:  reports that he has never smoked. He has never used smokeless tobacco. He reports current alcohol use of about 21.0 - 24.0 standard drinks of alcohol per week. He reports that he does not use drugs.  Family / Support Systems Marital Status: Married Patient Roles: Spouse, Other (Comment) (church member) Spouse/Significant Other: [name] Brown [phone] Other Supports: Church member and neighbor Anticipated Caregiver: wife Ability/Limitations of Caregiver: Wife needs surgery on her tendon son and fractured her ankle 12/2023 and is still recovering from this. Supervision level only Caregiver Availability: 24/7 Family Dynamics: Close with wife and friends along with church members. He was independent prior to fall and hopeful he can get to supervision level at [facility] from here  Social History Preferred language: English Religion: None Cultural Background: NA Education: Charity fundraiser - How often do you need to have someone help you when you read instructions, pamphlets, or other written material from your doctor or pharmacy?: Never Writes: Yes Employment Status: Retired Marine scientist Issues: NA Guardian/Conservator: None-according to MD pt is capable of making his own decisions while here  Abuse/Neglect Abuse/Neglect Assessment Can Be Completed: Yes Physical Abuse: Denies Verbal Abuse: Denies Sexual Abuse: Denies Exploitation of patient/patient's resources: Denies Self-Neglect: Denies  Patient  response to: Social Isolation - How often do you feel lonely or isolated from those around you?: Never  Emotional Status Pt's affect, behavior and adjustment status: Pt is motivated to do well and recover from this hip fracture. He was active and independent prior to this fall. He is ready to do therapies and get better. His only concern is his PWB 25% WB limitation he has Recent Psychosocial Issues: other health issues were being managed prior to this fall Psychiatric History: no history or issues-will see how he does and se eif would benefit from seeing neuro-psych while here Substance Abuse History: NA  Patient / Family Perceptions, Expectations & Goals Pt/Family understanding of illness & functional limitations: Pt is able to explain his hip fracture and WB limitations he does talk with the MD daily and feels he has a good understanding of his treatment plan moving forward. Premorbid pt/family roles/activities: husband, retiree, church member, Chief Financial Officer, etc Anticipated changes in roles/activities/participation: resume Pt/family expectations/goals: Pt states: " I need to do well my wife can not assist with her own health issues and ankle she still is having trouble with."  [employer]: None Premorbid Home Care/DME Agencies: Other (Comment) (cane) Transportation available at discharge: wife can drive Is the patient able to respond to transportation needs?: Yes In the past 12 months, has lack of transportation kept you from medical appointments or from getting medications?: No In the past 12 months, has lack of transportation kept you from meetings, work, or from getting things needed for daily living?: No  Discharge Planning Living Arrangements: Spouse/significant other Support Systems: Spouse/significant other, Friends/neighbors, Psychologist, clinical community Type of Residence: Private residence Insurance Resources: [employer], Media planner (specify) ([employer]) Surveyor, quantity Resources: Restaurant manager, fast food Screen Referred: No Living Expenses: Own Money Management: Patient, Spouse Does the patient have any problems obtaining your medications?: No Home Management: both Patient/Family Preliminary Plans: Return home with wife who is able to be there but is still dealing with her ankle issues and tendon surgery coming up. Aware will be evaluated tomorrow and goals will be set for stay here. If able will update goals and target date but not sure will have information tomorrow. Care Coordinator Barriers to Discharge: Decreased caregiver support, Weight bearing restrictions Care Coordinator Anticipated Follow Up Needs: HH/OP  Clinical Impression Pleasant gentleman who is motivated to do well here and recover from his hip fracture. His wife is limited due to ankle issues and need for surgery on her tendon. Will await team's evaluations and work on discharge needs.   Lucy Chris 11/07/2023, 2:32 PM

## 2023-11-07 NOTE — Progress Notes (Signed)
 PMR Admission Coordinator Pre-Admission Assessment   Patient: [name] Brown is an 73 y.o., male MRN: [id number] DOB: [birth year] Height: 5\' 6"  (167.6 cm) Weight: 81.6 kg   Insurance Information HMO:     PPO:      PCP:      IPA:      80/20:  yes    OTHER:  PRIMARY: Medicare A and B      Policy#: [id number]  Subscriber:  CM Name:       Phone#:      Fax#:  Pre-Cert#: verified Health and safety inspector:  Benefits:  Phone #:      Name:  Eff. Date: part a 08/13/2015 Part B 09/13/2015    Deduct: $1632      Out of Pocket Max: n/a      Life Max: n/a CIR: 100%      SNF: 20 full days Outpatient:      Co-Pay:  Home Health: 100%      Co-Pay:  DME:      Co-Pay:  Providers:  SECONDARY: [employer] [place]      Policy#: [id number]      Phone#:    Financial Counselor:       Phone#:    The "Data Collection Information Summary" for patients in Inpatient Rehabilitation Facilities with attached "Privacy Act Statement-Health Care Records" was provided and verbally reviewed with: Patient   Emergency Contact Information Contact Information       Name Relation Home Work Mobile    Parral Spouse [phone]   [phone]         Other Contacts   None on File        Current Medical History  Patient Admitting Diagnosis: Hip fracture        History of Present Illness: Pt. Is a 73 y.o. male with medical history significant for hypertension, hyperlipidemia, hypothyroidism, nonischemic cardiomyopathy with recovered EF, nonobstructive CAD, aortic stenosis status post bioprosthetic valve replacement, and colon cancer status post right colectomy who presented to the [facility] hospital ED  11/03/23 with right hip pain after a mechanical fall at home. Upon arrival to the ED, patient is found to be afebrile and saturating well on room air with normal heart rate and elevated blood pressure.  Labs are most notable for normal creatinine, normal WBC, and normal hemoglobin.  Plain radiographs of the right  femur and pelvis demonstrates suspected nondisplaced intertrochanteric right hip fracture and right proximal femoral shaft fracture. Orthopedic surgery was consulted and Pt. Underwent IM nail 11/03/23. Ortho recs for lovenox daily until mobilizing then transition to ASA 81mg  bid. Pt. Will admit to CIR to assist return to PLOF.    Patient's medical record from [facility]  has been reviewed by the rehabilitation admission coordinator and physician.   Past Medical History      Past Medical History:  Diagnosis Date   Aortic stenosis     Ascending aortic aneurysm (HCC) 09/18/2018   Colon cancer (HCC)     Family history of adverse reaction to anesthesia      pt states his mother had 2 episodes of hyponatremia following anesthesia    GERD (gastroesophageal reflux disease)     Headache     Heart murmur     Hyperlipidemia 10/25/2016   Hypertension      pt is currently not taking any medications; pt states is borderline   Melanoma (HCC)     OSA on CPAP 09/18/2018  PAF (paroxysmal atrial fibrillation) (HCC) 06/13/2017   Perforated sigmoid colon (HCC)     PVC's (premature ventricular contractions) 10/25/2016   Tinnitus     Wears glasses            Has the patient had major surgery during 100 days prior to admission? Yes   Family History   family history includes Alcohol abuse in his father; CAD in his mother; Cirrhosis in his father; Dementia in his mother; Heart attack in his father; Kidney disease in his mother; Stroke in his maternal grandmother and paternal grandmother; Vascular Disease in his mother.   Current Medications  Current Medications    Current Facility-Administered Medications:    acetaminophen (TYLENOL) tablet 650 mg, 650 mg, Oral, Q6H PRN, Opyd, Lavone Neri, MD, 650 mg at 11/07/23 0133   aspirin chewable tablet 81 mg, 81 mg, Oral, BID, Jerald Kief, MD   bisacodyl (DULCOLAX) EC tablet 5 mg, 5 mg, Oral, Daily PRN, Opyd, Lavone Neri, MD   calcium  carbonate (TUMS - dosed in mg elemental calcium) chewable tablet 200 mg of elemental calcium, 1 tablet, Oral, TID PRN, Jerald Kief, MD, 200 mg of elemental calcium at 11/04/23 1231   cyclobenzaprine (FLEXERIL) tablet 5 mg, 5 mg, Oral, TID PRN, Jerald Kief, MD, 5 mg at 11/07/23 1610   diltiazem (CARDIZEM CD) 24 hr capsule 120 mg, 120 mg, Oral, Daily, Opyd, Timothy S, MD, 120 mg at 11/06/23 2032   famotidine (PEPCID) tablet 20 mg, 20 mg, Oral, Daily, Jerald Kief, MD, 20 mg at 11/07/23 9604   feeding supplement (ENSURE ENLIVE / ENSURE PLUS) liquid 237 mL, 237 mL, Oral, BID BM, Jerald Kief, MD, 237 mL at 11/07/23 5409   folic acid (FOLVITE) tablet 1 mg, 1 mg, Oral, Daily, Jerald Kief, MD, 1 mg at 11/07/23 8119   HYDROmorphone (DILAUDID) injection 0.5-1 mg, 0.5-1 mg, Intravenous, Q2H PRN, Jerald Kief, MD, 1 mg at 11/07/23 1030   levothyroxine (SYNTHROID) tablet 100 mcg, 100 mcg, Oral, Daily, Opyd, Lavone Neri, MD, 100 mcg at 11/07/23 1478   multivitamin with minerals tablet 1 tablet, 1 tablet, Oral, Daily, Jerald Kief, MD, 1 tablet at 11/07/23 2956   oxyCODONE (Oxy IR/ROXICODONE) immediate release tablet 5 mg, 5 mg, Oral, Q4H PRN, Opyd, Lavone Neri, MD, 5 mg at 11/07/23 2130   pravastatin (PRAVACHOL) tablet 20 mg, 20 mg, Oral, Daily, Opyd, Lavone Neri, MD, 20 mg at 11/06/23 2031   prochlorperazine (COMPAZINE) injection 5 mg, 5 mg, Intravenous, Q6H PRN, Opyd, Lavone Neri, MD   rOPINIRole (REQUIP) tablet 0.75 mg, 0.75 mg, Oral, QHS, Opyd, Timothy S, MD, 0.75 mg at 11/06/23 2032   senna-docusate (Senokot-S) tablet 1 tablet, 1 tablet, Oral, QHS PRN, Opyd, Lavone Neri, MD, 1 tablet at 11/06/23 2031   thiamine (VITAMIN B1) tablet 100 mg, 100 mg, Oral, Daily, Jerald Kief, MD, 100 mg at 11/07/23 8657     Patients Current Diet:  Diet Order                  Diet regular Room service appropriate? Yes; Fluid consistency: Thin  Diet effective now                         Precautions  / Restrictions Precautions Precautions: Fall Restrictions Weight Bearing Restrictions Per Provider Order: Yes RLE Weight Bearing Per Provider Order: Partial weight bearing RLE Partial Weight Bearing Percentage or Pounds: 25%  Has the patient had 2 or more falls or a fall with injury in the past year? Yes   Prior Activity Level Pt.active in the community PTA Prior Functional Level Self Care: Did the patient need help bathing, dressing, using the toilet or eating? Independent   Indoor Mobility: Did the patient need assistance with walking from room to room (with or without device)? Independent   Stairs: Did the patient need assistance with internal or external stairs (with or without device)? Independent   Functional Cognition: Did the patient need help planning regular tasks such as shopping or remembering to take medications? Independent   Patient Information Are you of Hispanic, Latino/a,or Spanish origin?: A. No, not of Hispanic, Latino/a, or Spanish origin What is your race?: A. White Do you need or want an interpreter to communicate with a doctor or health care staff?: 0. No   Patient's Response To:  Health Literacy and Transportation Is the patient able to respond to health literacy and transportation needs?: Yes Health Literacy - How often do you need to have someone help you when you read instructions, pamphlets, or other written material from your doctor or pharmacy?: Never In the past 12 months, has lack of transportation kept you from medical appointments or from getting medications?: No In the past 12 months, has lack of transportation kept you from meetings, work, or from getting things needed for daily living?: No   Journalist, newspaper / Equipment Home Equipment: Shower seat, BSC/3in1, Agricultural consultant (2 wheels) (can get 3n1)   Prior Device Use: Indicate devices/aids used by the patient prior to current illness, exacerbation or injury? None of the above    Current Functional Level Cognition   Orientation Level: Oriented X4    Extremity Assessment (includes Sensation/Coordination)   Upper Extremity Assessment: Overall WFL for tasks assessed  Lower Extremity Assessment: Defer to PT evaluation RLE Deficits / Details: alert to light touch, unable to SLR or hip ABD/ADD     ADLs   Overall ADL's : Needs assistance/impaired Eating/Feeding: Set up Grooming: Set up, Sitting Upper Body Bathing: Minimal assistance, Sitting Lower Body Bathing: Moderate assistance Upper Body Dressing : Minimal assistance Lower Body Dressing: Minimal assistance, Sitting/lateral leans Lower Body Dressing Details (indicate cue type and reason): Simulated in room with reacher and sock aid Toilet Transfer: Contact guard assist, Ambulation, BSC/3in1, Rolling walker (2 wheels) Toileting- Clothing Manipulation and Hygiene: Contact guard assist Functional mobility during ADLs: Contact guard assist, Rolling walker (2 wheels)     Mobility   Overal bed mobility: Needs Assistance Bed Mobility: Supine to Sit Supine to sit: Min assist, HOB elevated, Used rails General bed mobility comments: MinA to guide R LE off EOB. Increased time and effort     Transfers   Overall transfer level: Needs assistance Equipment used: Rolling walker (2 wheels) Transfers: Sit to/from Stand Sit to Stand: Contact guard assist General transfer comment: CGA ambulation to bathroom     Ambulation / Gait / Stairs / Wheelchair Mobility   Ambulation/Gait Ambulation/Gait assistance: Contact guard assist Gait Distance (Feet): 20 Feet Assistive device: Rolling walker (2 wheels) Gait Pattern/deviations: Step-to pattern (hop-to) General Gait Details: CGA for safety, pt switches between keeping R LE NWB and using step to gait pattern to adhere to RLE PWB     Posture / Balance Dynamic Sitting Balance Sitting balance - Comments: pt leans to the left to avoid pain in R hip Balance Overall balance  assessment: Mild deficits observed, not formally tested, Needs assistance Sitting-balance  support: No upper extremity supported, Feet supported Sitting balance-Leahy Scale: Good Sitting balance - Comments: pt leans to the left to avoid pain in R hip Postural control: Left lateral lean Standing balance support: Bilateral upper extremity supported, During functional activity, Reliant on assistive device for balance Standing balance-Leahy Scale: Fair Standing balance comment: reliant on RW     Special needs/care consideration Special service needs none    Previous Home Environment (from acute therapy documentation) Living Arrangements: Spouse/significant other  Lives With: Spouse Available Help at Discharge: Family, Available 24 hours/day Type of Home: House Home Layout: Two level, Bed/bath upstairs (could be downstairs with 1/2 bath and  a twin bed in living room) Alternate Level Stairs-Number of Steps: flight Home Access: Ramped entrance Bathroom Shower/Tub: Health visitor: Handicapped height Bathroom Accessibility: Yes How Accessible: Accessible via wheelchair, Accessible via walker Home Care Services: No   Discharge Living Setting Plans for Discharge Living Setting: Patient's home Type of Home at Discharge: House Discharge Home Layout: Two level, 1/2 bath on main level, Able to live on main level with bedroom/bathroom Alternate Level Stairs-Rails: Right Alternate Level Stairs-Number of Steps: flight Discharge Home Access: Ramped entrance Entrance Stairs-Rails: Right Entrance Stairs-Number of Steps: 2 Discharge Bathroom Shower/Tub: Walk-in shower Discharge Bathroom Toilet: Handicapped height Discharge Bathroom Accessibility: Yes Does the patient have any problems obtaining your medications?: No   Social/Family/Support Systems Patient Roles: Spouse Contact Information: [phone] Anticipated Caregiver: Rissa Ability/Limitations of Caregiver: Min  A Caregiver Availability: 24/7 Discharge Plan Discussed with Primary Caregiver: Yes Is Caregiver In Agreement with Plan?: Yes Does Caregiver/Family have Issues with Lodging/Transportation while Pt is in Rehab?: No   Goals Patient/Family Goal for Rehab: PT/OT Mod I Expected length of stay: 7-10 days Pt/Family Agrees to Admission and willing to participate: Yes Program Orientation Provided & Reviewed with Pt/Caregiver Including Roles  & Responsibilities: Yes   Decrease burden of Care through IP rehab admission: not anticipated    Possible need for SNF placement upon discharge: not anticipated    Patient Condition: I have reviewed medical records from [facility], spoken with CM, and patient and spouse. I met with patient at the bedside for inpatient rehabilitation assessment.  Patient will benefit from ongoing PT and OT, can actively participate in 3 hours of therapy a day 5 days of the week, and can make measurable gains during the admission.  Patient will also benefit from the coordinated team approach during an Inpatient Acute Rehabilitation admission.  The patient will receive intensive therapy as well as Rehabilitation physician, nursing, social worker, and care management interventions.  Due to safety, skin/wound care, disease management, medication administration, pain management, and patient education the patient requires 24 hour a day rehabilitation nursing.  The patient is currently min A with mobility and basic ADLs.  Discharge setting and therapy post discharge at home with home health is anticipated.  Patient has agreed to participate in the Acute Inpatient Rehabilitation Program and will admit today.   Preadmission Screen Completed By:  Jeronimo Greaves, 11/07/2023 11:48 AM ______________________________________________________________________   Discussed status with Dr. Berline Chough  on 11/07/23 at 900 and received approval for admission today.   Admission Coordinator:   Jeronimo Greaves, CCC-SLP, time 1155/Date 11/07/23    Assessment/Plan: Diagnosis: R hip fx s/p IM nail PWB 25% Does the need for close, 24 hr/day Medical supervision in concert with the patient's rehab needs make it unreasonable for this patient to be served in a less intensive setting? Yes Co-Morbidities  requiring supervision/potential complications: HTN, CAD, CMO' HLD, hypothyroid, severe post op pain; R colectomy;  Due to bladder management, bowel management, safety, skin/wound care, disease management, medication administration, pain management, and patient education, does the patient require 24 hr/day rehab nursing? Yes Does the patient require coordinated care of a physician, rehab nurse, PT, OT, and SLP to address physical and functional deficits in the context of the above medical diagnosis(es)? Yes Addressing deficits in the following areas: balance, endurance, locomotion, strength, transferring, bowel/bladder control, bathing, dressing, feeding, grooming, and toileting Can the patient actively participate in an intensive therapy program of at least 3 hrs of therapy 5 days a week? Yes The potential for patient to make measurable gains while on inpatient rehab is good Anticipated functional outcomes upon discharge from inpatient rehab: modified independent and supervision PT, modified independent and supervision OT, n/a SLP Estimated rehab length of stay to reach the above functional goals is: 7-10 days Anticipated discharge destination: Home 10. Overall Rehab/Functional Prognosis: good     MD Signature:

## 2023-11-07 NOTE — Plan of Care (Signed)
  Problem: Education: Goal: Knowledge of General Education information will improve Description: Including pain rating scale, medication(s)/side effects and non-pharmacologic comfort measures Outcome: Progressing   Problem: Clinical Measurements: Goal: Respiratory complications will improve Outcome: Progressing   Problem: Activity: Goal: Risk for activity intolerance will decrease Outcome: Progressing   Problem: Nutrition: Goal: Adequate nutrition will be maintained Outcome: Progressing   Problem: Coping: Goal: Level of anxiety will decrease Outcome: Progressing   Problem: Elimination: Goal: Will not experience complications related to bowel motility Outcome: Progressing Goal: Will not experience complications related to urinary retention Outcome: Progressing   Problem: Pain Managment: Goal: General experience of comfort will improve and/or be controlled Outcome: Progressing   Problem: Safety: Goal: Ability to remain free from injury will improve Outcome: Progressing   Problem: Skin Integrity: Goal: Risk for impaired skin integrity will decrease Outcome: Progressing

## 2023-11-07 NOTE — Progress Notes (Signed)
 Mobility Specialist Progress Note:    11/07/23 1000  Mobility  Activity Ambulated with assistance in room;Ambulated with assistance in hallway  Level of Assistance Minimal assist, patient does 75% or more  Assistive Device Front wheel walker  Distance Ambulated (ft) 50 ft  RLE Weight Bearing Per Provider Order PWB  RLE Partial Weight Bearing Percentage or Pounds 25%  Activity Response Tolerated well  Mobility Referral Yes  Mobility visit 1 Mobility  Mobility Specialist Start Time (ACUTE ONLY) 1014  Mobility Specialist Stop Time (ACUTE ONLY) 1022  Mobility Specialist Time Calculation (min) (ACUTE ONLY) 8 min   Pt received in chair and agreeable. Able to stand and ambulate just outside room w/ minA. Distance limited d/t BUE fatigue from hop method. Pt left in chair with call bell and all needs met. Chair alarm on and family present.  D'Vante Earlene Plater Mobility Specialist Please contact via Special educational needs teacher or Rehab office at [phone]

## 2023-11-07 NOTE — Progress Notes (Signed)
.  Subjective: 4 Days Post-Op Procedure(s) (LRB): INTRAMEDULLARY (IM) NAIL FEMORAL (Right)  Patient seen and examined and assisted by me to and from the bathroom. Did well with PWB. Pain controlled by oxycodone.    Activity level:  PWB-25% for 2 weeks then can progress to 50% for 2 weeks then WBAT Diet tolerance:  as tolerated Patient reports pain as worse today than yesterday.   Patient reports pain as 7 on 0-10 scale.    Objective: Vital signs in last 24 hours: Temp:  [97.6 F (36.4 C)-98.9 F (37.2 C)] 97.6 F (36.4 C) (02/25 0714) Pulse Rate:  [75-94] 77 (02/25 0714) Resp:  [16-17] 16 (02/25 0517) BP: (113-129)/(51-73) 120/67 (02/25 0714) SpO2:  [92 %-99 %] 98 % (02/25 0714)  Labs: Recent Labs    11/05/23 0611 11/06/23 0629 11/07/23 0613  HGB 10.4* 10.7* 10.4*   Recent Labs    11/06/23 0629 11/07/23 0613  WBC 8.8 6.8  RBC 3.19* 3.13*  HCT 30.9* 29.9*  PLT 155 184   Recent Labs    11/06/23 0629 11/07/23 0613  NA 137 135  K 4.0 4.4  CL 103 102  CO2 28 27  BUN 15 17  CREATININE 1.08 1.03  GLUCOSE 112* 111*  CALCIUM 8.4* 8.4*   No results for input(s): "LABPT", "INR" in the last 72 hours.  Physical Exam:  Neurologically intact Neurovascular intact Sensation intact distally Intact pulses distally Dorsiflexion/Plantar flexion intact Incision: dressing C/D/I Compartment soft  Assessment/Plan:  4 Days Post-Op Procedure(s) (LRB): INTRAMEDULLARY (IM) NAIL FEMORAL (Right)  PWB-25% for 2 weeks then can progress to 50% for 2 weeks then WBAT PT/OT DVT ppx: recommend lovenox daily until mobilizing then transition to aspirin 81mg  BID Pain control as needed Hgb stabilized Patient and wife prefer SNF due to concerns about safety and mobility at home   Luci Bank 11/07/2023, 9:52 AM

## 2023-11-07 NOTE — Plan of Care (Signed)
  Problem: Consults Goal: RH GENERAL PATIENT EDUCATION Description: See Patient Education module for education specifics. Outcome: Progressing   Problem: RH SAFETY Goal: RH STG ADHERE TO SAFETY PRECAUTIONS W/ASSISTANCE/DEVICE Description: STG Adhere to Safety Precautions With cues Assistance/Device. Outcome: Progressing

## 2023-11-08 ENCOUNTER — Inpatient Hospital Stay (HOSPITAL_COMMUNITY): Payer: Medicare Other

## 2023-11-08 DIAGNOSIS — K5900 Constipation, unspecified: Secondary | ICD-10-CM

## 2023-11-08 DIAGNOSIS — I10 Essential (primary) hypertension: Secondary | ICD-10-CM | POA: Diagnosis not present

## 2023-11-08 DIAGNOSIS — R7989 Other specified abnormal findings of blood chemistry: Secondary | ICD-10-CM

## 2023-11-08 DIAGNOSIS — S72001S Fracture of unspecified part of neck of right femur, sequela: Secondary | ICD-10-CM | POA: Diagnosis not present

## 2023-11-08 DIAGNOSIS — M7989 Other specified soft tissue disorders: Secondary | ICD-10-CM

## 2023-11-08 DIAGNOSIS — D62 Acute posthemorrhagic anemia: Secondary | ICD-10-CM

## 2023-11-08 LAB — COMPREHENSIVE METABOLIC PANEL
ALT: 24 U/L (ref 0–44)
AST: 70 U/L — ABNORMAL HIGH (ref 15–41)
Albumin: 2.3 g/dL — ABNORMAL LOW (ref 3.5–5.0)
Alkaline Phosphatase: 44 U/L (ref 38–126)
Anion gap: 5 (ref 5–15)
BUN: 20 mg/dL (ref 8–23)
CO2: 26 mmol/L (ref 22–32)
Calcium: 8.1 mg/dL — ABNORMAL LOW (ref 8.9–10.3)
Chloride: 102 mmol/L (ref 98–111)
Creatinine, Ser: 1.08 mg/dL (ref 0.61–1.24)
GFR, Estimated: >60 mL/min (ref >60)
Glucose, Bld: 111 mg/dL — ABNORMAL HIGH (ref 70–99)
Potassium: 4.1 mmol/L (ref 3.5–5.1)
Sodium: 133 mmol/L — ABNORMAL LOW (ref 135–145)
Total Bilirubin: 0.9 mg/dL (ref 0.0–1.2)
Total Protein: 5 g/dL — ABNORMAL LOW (ref 6.5–8.1)

## 2023-11-08 LAB — CBC WITH DIFFERENTIAL/PLATELET
Abs Immature Granulocytes: 0.11 10*3/uL — ABNORMAL HIGH (ref 0.00–0.07)
Basophils Absolute: 0 10*3/uL (ref 0.0–0.1)
Basophils Relative: 1 %
Eosinophils Absolute: 0.1 10*3/uL (ref 0.0–0.5)
Eosinophils Relative: 2 %
HCT: 29.1 % — ABNORMAL LOW (ref 39.0–52.0)
Hemoglobin: 10.1 g/dL — ABNORMAL LOW (ref 13.0–17.0)
Immature Granulocytes: 2 %
Lymphocytes Relative: 14 %
Lymphs Abs: 0.9 10*3/uL (ref 0.7–4.0)
MCH: 33 pg (ref 26.0–34.0)
MCHC: 34.7 g/dL (ref 30.0–36.0)
MCV: 95.1 fL (ref 80.0–100.0)
Monocytes Absolute: 0.9 10*3/uL (ref 0.1–1.0)
Monocytes Relative: 13 %
Neutro Abs: 4.4 10*3/uL (ref 1.7–7.7)
Neutrophils Relative %: 68 %
Platelets: 212 10*3/uL (ref 150–400)
RBC: 3.06 MIL/uL — ABNORMAL LOW (ref 4.22–5.81)
RDW: 12.8 % (ref 11.5–15.5)
WBC: 6.5 10*3/uL (ref 4.0–10.5)
nRBC: 0 % (ref 0.0–0.2)

## 2023-11-08 MED ORDER — ACETAMINOPHEN 325 MG PO TABS
325.0000 mg | ORAL_TABLET | Freq: Four times a day (QID) | ORAL | Status: DC | PRN
  Administered 2023-11-09 – 2023-11-14 (×9): 650 mg via ORAL
  Filled 2023-11-08 (×10): qty 2

## 2023-11-08 MED ORDER — ENSURE ENLIVE PO LIQD
237.0000 mL | Freq: Two times a day (BID) | ORAL | Status: DC
  Administered 2023-11-08 – 2023-11-17 (×16): 237 mL via ORAL

## 2023-11-08 NOTE — Progress Notes (Signed)
 PROGRESS NOTE   Subjective/Complaints: Patient reports he slept well overall last night.  Pain is overall under control.  Occasional shooting pain into his right thigh area with movement.  Reports bowel movement last night.  ROS: Patient denies fever, new vision changes, dizziness, nausea, vomiting, diarrhea,  shortness of breath or chest pain, headache, or mood change.   Objective:   VAS [place] UPPER EXTREMITY VENOUS DUPLEX Result Date: 11/07/2023 UPPER VENOUS STUDY  Patient Name:  [name] Brown  Date of Exam:   11/07/2023 Medical Rec #: [id number]     Accession #:    [id number] Date of Birth: 12-16-1950    Patient Gender: M Patient Age:   73 years Exam Location:  [facility] Procedure:      VAS [place] UPPER EXTREMITY VENOUS DUPLEX Referring Phys: PAMELA LOVE --------------------------------------------------------------------------------  Indications: Immobility Risk Factors: Immobility Cancer Colon Surgery IM Nail 11/03/23. Comparison Study: None. Performing Technologist: Shona Simpson  Examination Guidelines: A complete evaluation includes B-mode imaging, spectral Doppler, color Doppler, and power Doppler as needed of all accessible portions of each vessel. Bilateral testing is considered an integral part of a complete examination. Limited examinations for reoccurring indications may be performed as noted.  Right Findings: +----------+------------+---------+-----------+----------+-------+ RIGHT     CompressiblePhasicitySpontaneousPropertiesSummary +----------+------------+---------+-----------+----------+-------+ IJV           Full       Yes       Yes                      +----------+------------+---------+-----------+----------+-------+ Subclavian    Full       Yes       Yes                      +----------+------------+---------+-----------+----------+-------+ Axillary      Full       Yes       Yes                       +----------+------------+---------+-----------+----------+-------+ Brachial      Full       Yes       Yes                      +----------+------------+---------+-----------+----------+-------+ Radial        Full       Yes       Yes                      +----------+------------+---------+-----------+----------+-------+ Ulnar         Full       Yes       Yes                      +----------+------------+---------+-----------+----------+-------+ Cephalic      Full                 Yes                      +----------+------------+---------+-----------+----------+-------+ Basilic       Full  Yes                      +----------+------------+---------+-----------+----------+-------+  Left Findings: +----------+------------+---------+-----------+------------------+-------+ LEFT      CompressiblePhasicitySpontaneous    Properties    Summary +----------+------------+---------+-----------+------------------+-------+ IJV           Full       Yes       Yes                              +----------+------------+---------+-----------+------------------+-------+ Subclavian    Full       Yes       Yes                              +----------+------------+---------+-----------+------------------+-------+ Axillary      Full       Yes       Yes                              +----------+------------+---------+-----------+------------------+-------+ Brachial      Full       Yes       Yes                              +----------+------------+---------+-----------+------------------+-------+ Radial        Full       Yes       Yes                              +----------+------------+---------+-----------+------------------+-------+ Ulnar         Full       Yes       Yes                              +----------+------------+---------+-----------+------------------+-------+ Cephalic      None       No        No     brightly echogenic  Acute  +----------+------------+---------+-----------+------------------+-------+ Basilic       Full                 Yes                              +----------+------------+---------+-----------+------------------+-------+ Acute SVT seen in cephalic vein in the proximal forearm around and proximal to the IV line.  Summary:  Right: No evidence of deep vein thrombosis in the upper extremity. No evidence of superficial vein thrombosis in the upper extremity.  Left: No evidence of deep vein thrombosis in the upper extremity. Findings consistent with acute superficial vein thrombosis involving the left cephalic vein.  *See table(s) above for measurements and observations.    Preliminary    Recent Labs    11/07/23 0613 11/08/23 0531  WBC 6.8 6.5  HGB 10.4* 10.1*  HCT 29.9* 29.1*  PLT 184 212   Recent Labs    11/07/23 0613 11/08/23 0531  NA 135 133*  K 4.4 4.1  CL 102 102  CO2 27 26  GLUCOSE 111* 111*  BUN 17 20  CREATININE 1.03 1.08  CALCIUM 8.4* 8.1*    Intake/Output Summary (Last 24 hours)  at 11/08/2023 1244 Last data filed at 11/08/2023 1610 Gross per 24 hour  Intake 537 ml  Output 475 ml  Net 62 ml        Physical Exam: Vital Signs Blood pressure 138/73, pulse 91, temperature 98.2 F (36.8 C), resp. rate 16, height 5\' 6"  (1.676 m), weight 87.4 kg, SpO2 94%.  Physical Exam:  General: NAD HEENT: Head is normocephalic, atraumatic, PERRLA, EOMI, sclera anicteric, oral mucosa dry Neck: Supple without JVD or lymphadenopathy Heart: Reg rate and rhythm. + murmur Chest: CTA bilaterally without wheezes, rales, or rhonchi; no distress Abdomen: Soft, non-tender, Mildly distended, bowel sounds positive. Small umbilical hernia. Extremities: No clubbing, cyanosis, or edema. Pulses are 2+ Psych: Pt's affect is appropriate. Pt is cooperative Skin: Clean and intact without signs of breakdown Neuro:     Mental Status: He is alert and oriented to person, place, and time.      Comments: Intact to light touch in all 4 extremities  Musculoskeletal:    Cervical back: Neck supple. No tenderness.     Comments: 2 + edema right thigh, knee and foot. Three surgical dressing in place on right hip/thigh UE strength 5/5 in B/L Ue's  LLE- 5/5 in HF, KE, KF, DF and PF RLE HF 2-/5; KE/KF 2-/5; DF/PF 5/5  Exam Stable 2/26 Assessment/Plan: 1. Functional deficits which require 3+ hours per day of interdisciplinary therapy in a comprehensive inpatient rehab setting. Physiatrist is providing close team supervision and 24 hour management of active medical problems listed below. Physiatrist and rehab team continue to assess barriers to discharge/monitor patient progress toward functional and medical goals  Care Tool:  Bathing    Body parts bathed by patient: Right arm, Left arm, Chest, Abdomen, Front perineal area, Buttocks, Right upper leg, Left upper leg, Face   Body parts bathed by helper: Right lower leg, Left lower leg     Bathing assist Assist Level: Minimal Assistance - Patient > 75%     Upper Body Dressing/Undressing Upper body dressing   What is the patient wearing?: Pull over shirt    Upper body assist Assist Level: Set up assist    Lower Body Dressing/Undressing Lower body dressing      What is the patient wearing?: Underwear/pull up, Pants     Lower body assist Assist for lower body dressing: Maximal Assistance - Patient 25 - 49%     Toileting Toileting    Toileting assist Assist for toileting: Minimal Assistance - Patient > 75%     Transfers Chair/bed transfer  Transfers assist     Chair/bed transfer assist level: Minimal Assistance - Patient > 75%     Locomotion Ambulation   Ambulation assist      Assist level: Contact Guard/Touching assist Assistive device: Walker-rolling Max distance: 20   Walk 10 feet activity   Assist     Assist level: Contact Guard/Touching assist Assistive device: Walker-rolling   Walk 50 feet  activity   Assist Walk 50 feet with 2 turns activity did not occur: Safety/medical concerns (pain/fatigue)         Walk 150 feet activity   Assist Walk 150 feet activity did not occur: Safety/medical concerns         Walk 10 feet on uneven surface  activity   Assist Walk 10 feet on uneven surfaces activity did not occur: Safety/medical concerns         Wheelchair     Assist Is the patient using a wheelchair?: Yes Type of Wheelchair:  Manual    Wheelchair assist level: Supervision/Verbal cueing Max wheelchair distance: 150    Wheelchair 50 feet with 2 turns activity    Assist        Assist Level: Supervision/Verbal cueing   Wheelchair 150 feet activity     Assist      Assist Level: Supervision/Verbal cueing   Blood pressure 138/73, pulse 91, temperature 98.2 F (36.8 C), resp. rate 16, height 5\' 6"  (1.676 m), weight 87.4 kg, SpO2 94%.  Medical Problem List and Plan: 1. Functional deficits secondary to R femur fx s/p IM nail- 25% PWB x 2 weeks, then 50 % for 2 weeks, then WBAT             -patient may  shower- cover incision             -ELOS/Goals: 7-10 days mod I             -Continue CIR, evaluations today  -Team conference today please see physician documentation under team conference tab, met with team  to discuss problems,progress, and goals. Formulized individual treatment plan based on medical history, underlying problem and comorbidities.   2.  Antithrombotics: -DVT/anticoagulation:  Pharmaceutical: Lovenox added             -antiplatelet therapy: ASA daily-->increase to BID at discharge.  3. Pain Management:  Currently dilaudid 1 mg IV qid w/ oxycodone 5 mg qid. --increase oxycodone to 5-10 mg and premedicate prior to therapy- stop IV Dilaudid --ice prn. Flexeril prn for muscle spasms.  4. Mood/Behavior/Sleep: Monitor sleep hygiene. Continue delirium precautions             --Repuip at nights for RLS. Trazodone prn for sleep.              -antipsychotic agents: N/A 5. Neuropsych/cognition: This patient is capable of making decisions on his own behalf. 6. Skin/Wound Care: Routine pressure relief measures.  7. Fluids/Electrolytes/Nutrition: Monitor I/O. Check CMET in am.  8. IM nail Right femur: 25% WB thorough 03/07  --followed by 50% WB X 2 weeks then WBAT 9. HTN: Monitor BP TID--on Cozaar and Cardizem.  -Stable, monitor    11/08/2023   12:16 PM 11/08/2023   10:00 AM 11/08/2023    4:31 AM  Vitals with BMI  Height  5\' 6"    Weight 192 lbs 11 oz    BMI 31.11    Systolic 138  123  Diastolic 73  66  Pulse 91  84     10. Non-obstructive CAD w/recovered EF: Continue Losartan, ASA and statin.  11.  Abnormal LFTs: Recheck labs in am  2/26 AST still elevated at 70, avoid hepatotoxic medication and monitor.  Change Tylenol to every 6 hours as needed from every 4 hours as needed. 12. ABLA: Recheck CBC in am  -2/26 hemoglobin overall stable at 10.1 13. Constipation: Will schedule Senna with supper. Doesn't want Sorbitol   -2/26 LMB late last night, continue to monitor  14. RLE edema- 2+ from R hip to R foot- monitor- might need dopplers      LOS: 1 days A FACE TO FACE EVALUATION WAS PERFORMED  Fanny Dance 11/08/2023, 12:44 PM

## 2023-11-08 NOTE — Progress Notes (Signed)
 Physical Therapy Assessment and Plan  Patient Details  Name: [name] Brown MRN: [id number] Date of Birth: 1951/03/25  PT Diagnosis: Abnormality of gait, Difficulty walking, Edema, Impaired sensation, Muscle weakness, and Pain in joint Rehab Potential: Good ELOS: 10-12 days   Today's Date: 11/08/2023 PT Individual Time: 4098-1191, 4782-9562 PT Individual Time Calculation (min): 72 min, 74 min   Hospital Problem: Principal Problem:   Fracture, proximal femur, right, sequela   Past Medical History:  Past Medical History:  Diagnosis Date   Aortic stenosis    Ascending aortic aneurysm (HCC) 09/18/2018   Colon cancer (HCC)    Family history of adverse reaction to anesthesia    pt states his mother had 2 episodes of hyponatremia following anesthesia    GERD (gastroesophageal reflux disease)    Headache    Heart murmur    Hyperlipidemia 10/25/2016   Hypertension    pt is currently not taking any medications; pt states is borderline   Melanoma (HCC)    OSA on CPAP 09/18/2018   PAF (paroxysmal atrial fibrillation) (HCC) 06/13/2017   Perforated sigmoid colon (HCC)    PVC's (premature ventricular contractions) 10/25/2016   Tinnitus    Wears glasses    Past Surgical History:  Past Surgical History:  Procedure Laterality Date   AORTIC VALVE REPLACEMENT N/A 06/08/2017   Procedure: AORTIC VALVE REPLACEMENT (AVR);  Surgeon: Alleen Borne, MD;  Location: [facility] OR;  Service: Open Heart Surgery;  Laterality: N/A;  Using 25mm Perimount Magna Ease Pericardial Bioprosthesis Aortic Valve   COLOSTOMY CLOSURE N/A 08/18/2016   Procedure: COLOSTOMY CLOSURE OPEN PROCEDURE;  Surgeon: Darnell Level, MD;  Location: [facility] ORS;  Service: General;  Laterality: N/A;   FEMUR IM NAIL Right 11/03/2023   Procedure: INTRAMEDULLARY (IM) NAIL FEMORAL;  Surgeon: Luci Bank, MD;  Location: [facility] OR;  Service: Orthopedics;  Laterality: Right;   LAPAROTOMY N/A 04/25/2016   Procedure: EXPLORATORY LAPAROTOMY WITH SIGMOID  COLECTOMY, COLOSTOMY;  Surgeon: Darnell Level, MD;  Location: [facility] ORS;  Service: General;  Laterality: N/A;   LEFT HEART CATH AND CORONARY ANGIOGRAPHY N/A 11/03/2016   Procedure: Left Heart Cath and Coronary Angiography;  Surgeon: Kathleene Hazel, MD;  [facility]: [facility] INVASIVE CV LAB;  Service: Cardiovascular;  Laterality: N/A;   MELANOMA EXCISION     PARTIAL COLECTOMY Right 08/18/2016   Procedure: RIGHT COLECTOMY;  Surgeon: Darnell Level, MD;  Location: [facility] ORS;  Service: General;  Laterality: Right;   TEE WITHOUT CARDIOVERSION N/A 06/08/2017   Procedure: TRANSESOPHAGEAL ECHOCARDIOGRAM (TEE);  Surgeon: Alleen Borne, MD;  Location: [facility] OR;  Service: Open Heart Surgery;  Laterality: N/A;    Assessment & Plan Clinical Impression: Patient is a 73 y.o. year old male with history of HTN, hypothyroid, non-obstructive CAD, AV replacement, colon CA s/p right colectomy who was admitted on 11/03/23 after stepping off ramp with fall onto right hip and immediate onset of right hip pain. He was found to have right femur cephalomedullary Fx and underwent IM nail on the same day by Dr. Hulda Humphrey. Post op to be 25% WB X 2 weeks followed by 50% WB X 2 weeks then WBAT. Post op reported penile numbness as well as thigh pain. .    Reactive leucocytosis and thrombocytopenia has resolved. ABLA being monitored. Started on ASA bid for DVT prophylaxis today. PT/OT has been working with patient who requires CGA to min assist with mobility and ADLs. CIR recommended due to functional decline.    Patient currently requires mod with mobility  secondary to muscle weakness and muscle joint tightness, decreased cardiorespiratoy endurance, and decreased sitting balance, decreased standing balance, decreased balance strategies, and difficulty maintaining precautions.  Prior to hospitalization, patient was independent  with mobility and lived with Spouse in a House home.  Home access is  Ramped entrance.  Patient will benefit from skilled PT  intervention to maximize safe functional mobility, minimize fall risk, and decrease caregiver burden for planned discharge home with intermittent supervision.  Anticipate patient will benefit from follow up HH at discharge.  PT - End of Session Activity Tolerance: Tolerates 30+ min activity with multiple rests Endurance Deficit: Yes PT Assessment Rehab Potential (ACUTE/IP ONLY): Good PT Barriers to Discharge: Home environment access/layout;Incontinence;Wound Care;Weight bearing restrictions;Weight PT Barriers to Discharge Comments: pain, swelling PT Patient demonstrates impairments in the following area(s): Balance;Edema;Endurance;Motor;Pain;Sensory PT Transfers Functional Problem(s): Bed to Chair;Bed Mobility;Car PT Locomotion Functional Problem(s): Ambulation;Wheelchair Mobility PT Plan PT Intensity: Minimum of 1-2 x/day ,45 to 90 minutes PT Frequency: 5 out of 7 days PT Duration Estimated Length of Stay: 10-12 days PT Treatment/Interventions: Ambulation/gait training;Community reintegration;DME/adaptive equipment instruction;Psychosocial support;Neuromuscular re-education;Stair training;Wheelchair propulsion/positioning;UE/LE Strength taining/ROM;Balance/vestibular training;Discharge planning;Pain management;Skin care/wound management;Therapeutic Activities;UE/LE Coordination activities;Cognitive remediation/compensation;Disease management/prevention;Functional mobility training;Patient/family education;Splinting/orthotics;Therapeutic Exercise;Visual/perceptual remediation/compensation PT Transfers Anticipated Outcome(s): mod I PT Locomotion Anticipated Outcome(s): supervision PT Recommendation Follow Up Recommendations: Home health PT Patient destination: Home Equipment Recommended: To be determined Equipment Details: pt has WC, needs RW   PT Evaluation Precautions/Restrictions Precautions Precautions: Fall Restrictions Weight Bearing Restrictions Per Provider Order: Yes RLE  Weight Bearing Per Provider Order: Touchdown weight bearing RLE Partial Weight Bearing Percentage or Pounds: 25% until 3/7, then 50% WB for 2 weeks, then WBAT Pain Interference Pain Interference Pain Effect on Sleep: 2. Occasionally Pain Interference with Therapy Activities: 1. Rarely or not at all Pain Interference with Day-to-Day Activities: 3. Frequently Home Living/Prior Functioning Home Living Available Help at Discharge: Family;Available 24 hours/day Type of Home: House Home Access: Ramped entrance Home Layout: Two level;Bed/bath upstairs (however pt able to live on main floor, with access to full bathroom (in guest house)) Alternate Level Stairs-Number of Steps: flight to 2nd floor (where pt typical bathroom is) Alternate Level Stairs-Rails: Left Bathroom Shower/Tub: Walk-in shower (with lipped entry) Bathroom Toilet: Handicapped height Bathroom Accessibility: Yes  Lives With: Spouse Prior Function Level of Independence: Independent with basic ADLs;Independent with homemaking with ambulation;Independent with gait;Independent with transfers  Able to Take Stairs?: Yes Driving: Yes Vocation: Retired Gaffer: retired Marine scientist - History Ability to See in Adequate Light: 0 Adequate Perception Perception: Within Functional Limits Praxis Praxis: WFL  Cognition Overall Cognitive Status: Within Functional Limits for tasks assessed Arousal/Alertness: Awake/alert Orientation Level: Oriented X4 Attention: Focused;Sustained;Selective Focused Attention: Appears intact Sustained Attention: Appears intact Selective Attention: Appears intact Memory: Appears intact Awareness: Appears intact Problem Solving: Appears intact Executive Function: Decision Making;Sequencing Sequencing: Appears intact Decision Making: Appears intact Safety/Judgment: Appears intact Sensation Sensation Light Touch: Impaired Detail Peripheral sensation comments:  groin sensation is numb as well Light Touch Impaired Details: Impaired RLE Additional Comments: shooting pain in R groin region, penile numbness 2/2 surgery, however improving. pt denies any N&T, light touch intact in all dermatomes. Coordination Gross Motor Movements are Fluid and Coordinated: No Fine Motor Movements are Fluid and Coordinated: Yes Coordination and Movement Description: impaired 2/2 R LE TWB precautions/pain/fatigue Motor  Motor Motor - Skilled Clinical Observations: generalized weakness  Trunk/Postural Assessment  Cervical Assessment Cervical Assessment: Within Functional Limits Thoracic Assessment Thoracic Assessment: Within Functional Limits  Lumbar Assessment Lumbar Assessment: Within Functional Limits Postural Control Postural Control: Within Functional Limits  Balance Balance Balance Assessed: Yes Dynamic Sitting Balance Dynamic Sitting - Level of Assistance: 5: Stand by assistance Sitting balance - Comments: pt leans to the left to avoid pain in R hip--pt endorses tingling pain along R waist band/groin region Static Standing Balance Static Standing - Balance Support: During functional activity Static Standing - Level of Assistance: 4: Min assist Dynamic Standing Balance Dynamic Standing - Balance Support: During functional activity;Bilateral upper extremity supported Dynamic Standing - Level of Assistance: 4: Min assist Extremity Assessment  RUE Assessment RUE Assessment: Within Functional Limits LUE Assessment LUE Assessment: Within Functional Limits RLE Assessment RLE Assessment: Exceptions to [facility] RLE Strength RLE Overall Strength: Deficits;Due to pain Right Hip Flexion: 1/5 Right Hip ABduction: 1/5 Right Knee Flexion: 2-/5 Right Knee Extension: 2-/5 Right Ankle Dorsiflexion: 2/5 Right Ankle Plantar Flexion: 3/5    Care Tool Care Tool Bed Mobility Roll left and right activity   Roll left and right assist level: Moderate Assistance - Patient  50 - 74%    Sit to lying activity   Sit to lying assist level: Moderate Assistance - Patient 50 - 74%    Lying to sitting on side of bed activity   Lying to sitting on side of bed assist level: the ability to move from lying on the back to sitting on the side of the bed with no back support.: Moderate Assistance - Patient 50 - 74%     Care Tool Transfers Sit to stand transfer   Sit to stand assist level: Minimal Assistance - Patient > 75%    Chair/bed transfer   Chair/bed transfer assist level: Minimal Assistance - Patient > 75%    Car transfer   Car transfer assist level: Minimal Assistance - Patient > 75%      Care Tool Locomotion Ambulation   Assist level: Contact Guard/Touching assist Assistive device: Walker-rolling Max distance: 20  Walk 10 feet activity   Assist level: Contact Guard/Touching assist Assistive device: Walker-rolling   Walk 50 feet with 2 turns activity Walk 50 feet with 2 turns activity did not occur: Safety/medical concerns (pain/fatigue)      Walk 150 feet activity Walk 150 feet activity did not occur: Safety/medical concerns      Walk 10 feet on uneven surfaces activity Walk 10 feet on uneven surfaces activity did not occur: Safety/medical concerns      Stairs Stair activity did not occur: Safety/medical concerns        Walk up/down 1 step activity Walk up/down 1 step or curb (drop down) activity did not occur: Safety/medical concerns      Walk up/down 4 steps activity Walk up/down 4 steps activity did not occur: Safety/medical concerns      Walk up/down 12 steps activity Walk up/down 12 steps activity did not occur: Safety/medical concerns      Pick up small objects from floor   Pick up small object from the floor assist level: Total Assistance - Patient < 25%    Wheelchair Is the patient using a wheelchair?: Yes Type of Wheelchair: Manual   Wheelchair assist level: Supervision/Verbal cueing Max wheelchair distance: 150  Wheel 50  feet with 2 turns activity   Assist Level: Supervision/Verbal cueing  Wheel 150 feet activity   Assist Level: Supervision/Verbal cueing    Refer to Care Plan for Long Term Goals  SHORT TERM GOAL WEEK 1 PT Short Term Goal 1 (Week 1): Pt  will perform sit to stand with RW and supervision PT Short Term Goal 2 (Week 1): Pt will perform stand pivot transfer with RW and supervision PT Short Term Goal 3 (Week 1): pt will ambulate 25 feet with RW and CGA  Recommendations for other services: None   Skilled Therapeutic Intervention Mobility Bed Mobility Bed Mobility: Rolling Left;Supine to Sit;Sit to Supine Rolling Left: Moderate Assistance - Patient 50-74% Sit to Supine: Moderate Assistance - Patient 50-74% Transfers Transfers: Sit to Stand;Stand to Sit;Stand Pivot Transfers Sit to Stand: Minimal Assistance - Patient > 75% Stand to Sit: Minimal Assistance - Patient > 75% Stand Pivot Transfers: Minimal Assistance - Patient > 75% Stand Pivot Transfer Details: Visual cues/gestures for precautions/safety;Verbal cues for precautions/safety;Verbal cues for technique;Verbal cues for safe use of DME/AE Transfer (Assistive device): Rolling walker Locomotion  Gait Ambulation: Yes Gait Assistance: Contact Guard/Touching assist Gait Distance (Feet): 20 Feet Assistive device: Rolling walker Gait Gait: Yes Gait Pattern: Impaired Gait Pattern: Step-to pattern (with R LE TDWB) Stairs / Additional Locomotion Stairs: No Wheelchair Mobility Wheelchair Mobility: Yes Wheelchair Assistance: Doctor, general practice: Both upper extremities Wheelchair Parts Management: Supervision/cueing Distance: 150   Discharge Criteria: Patient will be discharged from PT if patient refuses treatment 3 consecutive times without medical reason, if treatment goals not met, if there is a change in medical status, if patient makes no progress towards goals or if patient is discharged from  hospital.  The above assessment, treatment plan, treatment alternatives and goals were discussed and mutually agreed upon: by patient and by family  Today's Interventions  Evaluation completed (see details above and below) with education on PT POC and goals and individual treatment initiated with focus on transfer training with adherence to precautions.   Pt seated in WC upon arrival. Pt agreeable to therapy. Pt reports R LE pain, not quantified, premedicated. Therapist provided rest breaks and repositioning. Pt reports shooting pain in R hip, pt frequently compensating from R LE pain with R hip hike in seated position to offload R hip. Notified MD about this during team conference.   Pt performed sit to stand, stand pivot trasnfer with RW and min A for power up, and assisting with advancing R LE forward for maintenance of R LE TDWBing precautions, throughout session, pt demos improved ability to move R LE forward prior to standing, with mod-max verbal cues provided for technique.   Pt ambulated 20 feet with RW and CGA, with R LE TDWBing and step to gait, verbal cues provided for maintenance of precautions.   Pt self propelled WC on flat surface with supervision, vebral cues provided for safety with navigating turns 2/2 R LE extended on elevating leg rests. Pt required CGA/min A for self propelling WC up/down ramp.   Pt performed stand pivot transfer WC to car simulator at height of SUV to simulate pt car with RW and min A, with therapist assisting with R LE, verbal cues provided for posterior scoot to allow clearance for R LE 2/2 pt decreased hip flexion/knee flexion ROM (pain, swelling).   Discussed pt home set up. Pt reports he does not need to be able to go up stairs to access a full bathroom for shower as pt has guest house with walk in shower, level entry to enter house. Pt reports he would rather wait until weight bearing restrictions are lifted to begin stair navigation for safety.   Pt  performed supine to sit with max A, for management of R LE and trunk  and sit to supine with mod A for management of B LE 2/2 pain/weakness.   Pt supine in bed with all needs within reach and bed alarm on.   Treatment Session 2   Pt supine in bed upon arrival. Pt agreeable to therapy. Pt reports 5/10 pain in R hip, premedicated. Therapist provided supine rest breaks and repositioning throughout session as needed. Notified MD via secure chat to request k pad for pain management.   Supine to sit wit max A for trunk and R LE, with bed flat and without use of bedrails for therapist assessment. Pt demos pelvic obliquity with R hip hike for pain management while seated EOB.   Therex  Ankle pumps 1x15   Quad sets 1x15-pt lacking ~5 deg 2/2 R LE swelling  Glute sets 1x15  Attemtped SLR but discontinued 2/2 increased pain   Attempted supine hip abduction but discontinued 2/2 pain   SAQ 3x10 with approximately 40 deg knee extension lag on first trail progressing to approximately 15 deg extension lag on 3rd trial after active assisted SAQ on 2nd trial   LAQ x10 actively (2/5 MMT), active assisted x10, pt demos improved ROM with each rep  Heel slides with towel under pt leg, active assisted 2x10, empty end feel around ~75-80 deg knee flexion, however pt able to actively contribute more with each rep.   Handout provided for HEP to perform while in room, when not in therapy to improve R LE ROM/strength/healing/activity tolerance/independence with mobility:   Access Code: [email or web address] URL: [name] Date: 11/08/2023 Prepared by: Ambrose Finland  Exercises - Supine Ankle Pumps  - 1 x daily - 7 x weekly - 3 sets - 10 reps - Supine Gluteal Sets  - 1 x daily - 7 x weekly - 3 sets - 10 reps - Supine Quadricep Sets  - 1 x daily - 7 x weekly - 3 sets - 10 reps - Seated Long Arc Quad  - 1 x daily - 7 x weekly - 3 sets - 10 reps   Supine to sit with HOB elevated to 41 deg, use of bed  rails and gait belt to assist R LE with supervision, and increased time, vebral cues provided for technique.   Pt requesting to use bathroom. Pt reports previously walking to bathroom and sitting directly on toilet during AM OT session. Pt ambulated bed to toilet with RW and CGA with combination of R LE NWB and R LE TDWBing. Pt performed ambulatory transfer to toilet with mod A for adherence to R LE TDWBing precautions with use of RW and grab bar. Pt unable to comfortably sit on toilet 2/2 pain in R hip. Therapist opted to use BSC, pt agreeable.   Pt performed stand pivot transfer toilet to WC with mod A, and WC to BSC (after pt brought BSC to room) with min A. Therapist updated safety plan to stand pivot transfer to [facility] or BSC over toilet for safety. Pt seated on BSC with tech in room at end of session, therapist emphasized R LE TDWBing precautions to tech.             [facility] [place], Croom, DPT  11/08/2023, 12:53 PM

## 2023-11-08 NOTE — Patient Care Conference (Signed)
 Inpatient RehabilitationTeam Conference and Plan of Care Update Date: 11/08/2023   Time: 12:15 PM    Patient Name: [name] Brown      Medical Record Number: [id number]  Date of Birth: 1951-06-06 Sex: Male         Room/Bed: 4M06C/4M06C-01 Payor Info: Payor: MEDICARE / Plan: MEDICARE PART A AND B / Product Type: *No Product type* /    Admit Date/Time:  11/07/2023  1:21 PM  Primary Diagnosis:  Fracture, proximal femur, right, sequela  Hospital Problems: Principal Problem:   Fracture, proximal femur, right, sequela    Expected Discharge Date: Expected Discharge Date:  (evals pending)  Team Members Present: Physician leading conference: Dr. Fanny Dance Social Worker Present: Dossie Der, LCSW Nurse Present: Chana Bode, RN PT Present: Ambrose Finland, PT OT Present: Lou Cal, OT PPS Coordinator present : Fae Pippin, SLP     Current Status/Progress Goal Weekly Team Focus  Bowel/Bladder      Continent of bowel and bladder          Swallow/Nutrition/ Hydration               ADL's   Setup-Min A for UB care, Max A for LB care   Mod I   General conditiong, functional transfers, LB AE    Mobility   bed mobility: mod A, sit to stand min A, stnad pivot transfer min A with RW, gait x20 feet with RW and CGA   mod I  barriers: pain, swelling, strength,fatigue, shooting nerve pain in R hip; DME: pt has WC, pt needs to purchase elevating leg rest, pt requesting hospital bed; D/C date: pending    Communication                Safety/Cognition/ Behavioral Observations               Pain      Pain managed with  prns; new shooting pain in hip /groin area post op    Pain < 4 with prns    Assess rationale for and treatment for groin pain and effectiveness of medications  Skin      N/a           Discharge Planning:  new evaluation: Home with wife who has ankel issues from a past fracture and needs tendon surgery. She can only provide supervision  level. Await team's evaluations   Team Discussion: Patient post right femur fracture with groin pain.  TDWB x 2 weeks then [facility] for 2 weeks.   Patient on target to meet rehab goals: Currently needs set up for upper body care and max assist for lower body care. Needs mod assist for bed mobility. Patient needs min assist for sit - stand and able to ambulate up to 20' using a RW with CGA.  Goals for discharge set for mod I overall.  *See Care Plan and progress notes for long and short-term goals.   Revisions to Treatment Plan:  N/a  Teaching Needs: Safety, medications, weight bearing precautions, transfers, toileting, etc.   Current Barriers to Discharge: Decreased caregiver support, Home enviroment access/layout, and Weight bearing restrictions  Possible Resolutions to Barriers: Family education     Medical Summary Current Status: R femur fx, HTN, CAD, elevated LFT, ABLA, constipation, pain, RLE edema  Barriers to Discharge: Medical stability;Self-care education;Uncontrolled Pain  Barriers to Discharge Comments: R femur fx, HTN, CAD, elevated LFT, ABLA, constipation, pain, RLE edema Possible Resolutions to [employer] Focus: pain medications, monitor LFTs, monitor bowel function  Continued Need for Acute Rehabilitation Level of Care: The patient requires daily medical management by a physician with specialized training in physical medicine and rehabilitation for the following reasons: Direction of a multidisciplinary physical rehabilitation program to maximize functional independence : Yes Medical management of patient stability for increased activity during participation in an intensive rehabilitation regime.: Yes Analysis of laboratory values and/or radiology reports with any subsequent need for medication adjustment and/or medical intervention. : Yes   I attest that I was present, lead the team conference, and concur with the assessment and plan of the team.   Chana Bode  B 11/08/2023, 2:26 PM

## 2023-11-08 NOTE — Progress Notes (Signed)
 Inpatient Rehabilitation  Patient information reviewed and entered into eRehab system by [facility]. Karen Kays., CCC/SLP, PPS Coordinator.  Information including medical coding, functional ability and quality indicators will be reviewed and updated through discharge.

## 2023-11-08 NOTE — Evaluation (Signed)
 Occupational Therapy Assessment and Plan  Patient Details  Name: [name] Brown MRN: [id number] Date of Birth: August 20, 1951  OT Diagnosis: muscle weakness (generalized) Rehab Potential: Rehab Potential (ACUTE ONLY): Good ELOS: 10-12 days   Today's Date: 11/08/2023 OT Individual Time: 4098-1191 OT Individual Time Calculation (min): 60 min     Hospital Problem: Principal Problem:   Fracture, proximal femur, right, sequela   Past Medical History:  Past Medical History:  Diagnosis Date   Aortic stenosis    Ascending aortic aneurysm (HCC) 09/18/2018   Colon cancer (HCC)    Family history of adverse reaction to anesthesia    pt states his mother had 2 episodes of hyponatremia following anesthesia    GERD (gastroesophageal reflux disease)    Headache    Heart murmur    Hyperlipidemia 10/25/2016   Hypertension    pt is currently not taking any medications; pt states is borderline   Melanoma (HCC)    OSA on CPAP 09/18/2018   PAF (paroxysmal atrial fibrillation) (HCC) 06/13/2017   Perforated sigmoid colon (HCC)    PVC's (premature ventricular contractions) 10/25/2016   Tinnitus    Wears glasses    Past Surgical History:  Past Surgical History:  Procedure Laterality Date   AORTIC VALVE REPLACEMENT N/A 06/08/2017   Procedure: AORTIC VALVE REPLACEMENT (AVR);  Surgeon: Alleen Borne, MD;  Location: [facility] OR;  Service: Open Heart Surgery;  Laterality: N/A;  Using 25mm Perimount Magna Ease Pericardial Bioprosthesis Aortic Valve   COLOSTOMY CLOSURE N/A 08/18/2016   Procedure: COLOSTOMY CLOSURE OPEN PROCEDURE;  Surgeon: Darnell Level, MD;  Location: [facility] ORS;  Service: General;  Laterality: N/A;   FEMUR IM NAIL Right 11/03/2023   Procedure: INTRAMEDULLARY (IM) NAIL FEMORAL;  Surgeon: Luci Bank, MD;  Location: [facility] OR;  Service: Orthopedics;  Laterality: Right;   LAPAROTOMY N/A 04/25/2016   Procedure: EXPLORATORY LAPAROTOMY WITH SIGMOID COLECTOMY, COLOSTOMY;  Surgeon: Darnell Level, MD;  Location:  [facility] ORS;  Service: General;  Laterality: N/A;   LEFT HEART CATH AND CORONARY ANGIOGRAPHY N/A 11/03/2016   Procedure: Left Heart Cath and Coronary Angiography;  Surgeon: Kathleene Hazel, MD;  Location: [facility] INVASIVE CV LAB;  Service: Cardiovascular;  Laterality: N/A;   MELANOMA EXCISION     PARTIAL COLECTOMY Right 08/18/2016   Procedure: RIGHT COLECTOMY;  Surgeon: Darnell Level, MD;  Location: [facility] ORS;  Service: General;  Laterality: Right;   TEE WITHOUT CARDIOVERSION N/A 06/08/2017   Procedure: TRANSESOPHAGEAL ECHOCARDIOGRAM (TEE);  Surgeon: Alleen Borne, MD;  Location: [facility] OR;  Service: Open Heart Surgery;  Laterality: N/A;    Assessment & Plan Clinical Impression: Patient is a 73 y.o. year old male with history of HTN, hypothyroid, non-obstructive CAD, AV replacement, colon CA s/p right colectomy who was admitted on 11/03/23 after stepping off ramp with fall onto right hip and immediate onset of right hip pain. He was found to have right femur cephalomedullary Fx and underwent IM nail on the same day by Dr. Hulda Humphrey. Post op to be 25% WB X 2 weeks followed by 50% WB X 2 weeks then WBAT. Post op reported penile numbness as well as thigh pain. .    Reactive leucocytosis and thrombocytopenia has resolved. ABLA being monitored. Started on ASA bid for DVT prophylaxis today. PT/OT has been working with patient who requires CGA to min assist with mobility and ADLs. CIR recommended due to functional decline.   Patient transferred to CIR on 11/07/2023 .    Patient currently requires max with  LB ADLs and min A for mobility with RW  secondary to muscle weakness and acute pain, decreased cardiorespiratoy endurance, and decreased standing balance, decreased balance strategies, and difficulty maintaining precautions.  Prior to hospitalization, patient could complete ADL with independent .  Patient will benefit from skilled intervention to decrease level of assist with basic self-care skills and increase  independence with basic self-care skills prior to discharge home with care partner.  Anticipate patient will require intermittent supervision and follow up home health.  OT - End of Session Activity Tolerance: Tolerates 30+ min activity with multiple rests Endurance Deficit: Yes OT Assessment Rehab Potential (ACUTE ONLY): Good OT Patient demonstrates impairments in the following area(s): Balance;Pain;Cognition;Edema;Endurance;Motor;Safety;Sensory;Skin Integrity OT Basic ADL's Functional Problem(s): Bathing;Dressing;Toileting OT Transfers Functional Problem(s): Toilet;Tub/Shower OT Additional Impairment(s): None OT Plan OT Intensity: Minimum of 1-2 x/day, 45 to 90 minutes OT Frequency: 5 out of 7 days OT Duration/Estimated Length of Stay: 10-12 days OT Treatment/Interventions: Metallurgist training;Community reintegration;Neuromuscular re-education;Patient/family education;Self Care/advanced ADL retraining;Therapeutic Exercise;UE/LE Coordination activities;Discharge planning;DME/adaptive equipment instruction;Functional mobility training;Pain management;Psychosocial support;Skin care/wound managment;Therapeutic Activities;UE/LE Strength taining/ROM OT Self Feeding Anticipated Outcome(s): n/a OT Basic Self-Care Anticipated Outcome(s): mod I OT Toileting Anticipated Outcome(s): mod I OT Bathroom Transfers Anticipated Outcome(s): mod I OT Recommendation Patient destination: Home Follow Up Recommendations: None Equipment Recommended: To be determined   OT Evaluation Precautions/Restrictions  Precautions Precautions: Fall Restrictions Weight Bearing Restrictions Per Provider Order: Yes RLE Weight Bearing Per Provider Order: Touchdown weight bearing RLE Partial Weight Bearing Percentage or Pounds: 25% General Chart Reviewed: Yes Family/Caregiver Present: No Vital Signs   Pain Pain Assessment Pain Scale: 0-10 Pain Score: 7  Pain Type: Surgical pain Pain Location: Hip Pain  Orientation: Right Pain Descriptors / Indicators: Aching;Throbbing;Shooting Pain Frequency: Constant Pain Onset: Progressive Patients Stated Pain Goal: 0 Pain Intervention(s): Medication (See eMAR) Multiple Pain Sites: No Ice applied on right hip for pain and showered Home Living/Prior Functioning Home Living Living Arrangements: Spouse/significant other Available Help at Discharge: Family, Available 24 hours/day Type of Home: House Home Access: Ramped entrance Home Layout: Two level, Bed/bath upstairs Alternate Level Stairs-Number of Steps: flight Bathroom Shower/Tub: Health visitor: Handicapped height Bathroom Accessibility: Yes  Lives With: Spouse Prior Function Level of Independence: Independent with basic ADLs, Independent with homemaking with ambulation, Independent with gait, Independent with transfers  Able to Take Stairs?: Yes Driving: Yes 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 Vision/History: 1 Wears glasses Ability to See in Adequate Light: 0 Adequate Patient Visual Report: No change from baseline Vision Assessment?: No apparent visual deficits Perception  Perception: Within Functional Limits Praxis Praxis: WFL Cognition Cognition Overall Cognitive Status: Within Functional Limits for tasks assessed Arousal/Alertness: Awake/alert Orientation Level: Person;Place;Situation Person: Oriented Place: Oriented Situation: Oriented Memory: Appears intact Attention: Focused;Sustained;Selective Focused Attention: Appears intact Sustained Attention: Appears intact Selective Attention: Appears intact Awareness: Appears intact Problem Solving: Appears intact Executive Function: Decision Making;Sequencing Sequencing: Appears intact Decision Making: Appears intact Safety/Judgment: Appears intact Brief Interview for Mental Status (BIMS) Repetition of Three Words (First Attempt): 3 Temporal Orientation: Year:  Correct Temporal Orientation: Month: Accurate within 5 days Temporal Orientation: Day: Correct Recall: "Sock": Yes, no cue required Recall: "Blue": Yes, no cue required Recall: "Bed": Yes, no cue required BIMS Summary Score: 15 Sensation Sensation Light Touch: Impaired Detail Peripheral sensation comments: groin sensation is numb as well Light Touch Impaired Details: Impaired RLE Additional Comments: penile numbness 2/2 surgery, however improving. pt denies any N&T, light touch intact in all dermatomes. Coordination Gross  Motor Movements are Fluid and Coordinated: No Fine Motor Movements are Fluid and Coordinated: Yes Motor  Motor Motor - Skilled Clinical Observations: generalized weakness  Trunk/Postural Assessment  Cervical Assessment Cervical Assessment: Within Functional Limits Thoracic Assessment Thoracic Assessment: Within Functional Limits Lumbar Assessment Lumbar Assessment: Within Functional Limits Postural Control Postural Control: Within Functional Limits  Balance Balance Balance Assessed: Yes Dynamic Sitting Balance Dynamic Sitting - Level of Assistance: 5: Stand by assistance Static Standing Balance Static Standing - Balance Support: During functional activity Static Standing - Level of Assistance: 4: Min assist Dynamic Standing Balance Dynamic Standing - Balance Support: During functional activity;Bilateral upper extremity supported Dynamic Standing - Level of Assistance: 4: Min assist Extremity/Trunk Assessment RUE Assessment RUE Assessment: Within Functional Limits LUE Assessment LUE Assessment: Within Functional Limits  Care Tool Care Tool Self Care Eating   Eating Assist Level: Independent    Oral Care    Oral Care Assist Level: Set up assist    Bathing   Body parts bathed by patient: Right arm;Left arm;Chest;Abdomen;Front perineal area;Buttocks;Right upper leg;Left upper leg;Face Body parts bathed by helper: Right lower leg;Left lower leg    Assist Level: Minimal Assistance - Patient > 75%    Upper Body Dressing(including orthotics)   What is the patient wearing?: Pull over shirt   Assist Level: Set up assist    Lower Body Dressing (excluding footwear)   What is the patient wearing?: Underwear/pull up;Pants Assist for lower body dressing: Maximal Assistance - Patient 25 - 49%    Putting on/Taking off footwear   What is the patient wearing?: Socks;Shoes;Ted hose Assist for footwear: Total Assistance - Patient < 25%       Care Tool Toileting Toileting activity   Assist for toileting: Minimal Assistance - Patient > 75%     Care Tool Bed Mobility Roll left and right activity   Roll left and right assist level: Moderate Assistance - Patient 50 - 74%    Sit to lying activity   Sit to lying assist level: Moderate Assistance - Patient 50 - 74%    Lying to sitting on side of bed activity   Lying to sitting on side of bed assist level: the ability to move from lying on the back to sitting on the side of the bed with no back support.: Moderate Assistance - Patient 50 - 74%     Care Tool Transfers Sit to stand transfer   Sit to stand assist level: Minimal Assistance - Patient > 75%    Chair/bed transfer   Chair/bed transfer assist level: Minimal Assistance - Patient > 75%     Toilet transfer   Assist Level: Minimal Assistance - Patient > 75%     Care Tool Cognition  Expression of Ideas and Wants Expression of Ideas and Wants: 4. Without difficulty (complex and basic) - expresses complex messages without difficulty and with speech that is clear and easy to understand  Understanding Verbal and Non-Verbal Content Understanding Verbal and Non-Verbal Content: 4. Understands (complex and basic) - clear comprehension without cues or repetitions   Memory/Recall Ability     Refer to Care Plan for Long Term Goals  SHORT TERM GOAL WEEK 1 OT Short Term Goal 1 (Week 1): Pt will perform toileting tasks with contact guard for  balance OT Short Term Goal 2 (Week 1): Pt will thread LB clothing (underwear and pants) with superivsion with AE prn OT Short Term Goal 3 (Week 1): Pt will be given HEP for right Le to  improve mobility for ADL performance  Recommendations for other services: Neuropsych   Skilled Therapeutic Intervention 1:1 OT eval initiated with OT purpose, role and goals discussed with pt. Self care retraining at shower level . Pt required A to move right LE to EOB to come to sitting EOB. Pt able to perform sit to stands in session with RW with cues for hand placement. PT ambulated to and from the bathroom with RW with min A but with difficulty kicking right LE out in front of him (keeps it behind him). Pt showered sitting with lateral leans for washing buttocks. Pt requires A for LB dressing - would benefit from AE education as an option to thread clothing. Ice applied to right hip at end of session.    ADL ADL Grooming: Setup Where Assessed-Grooming: Sitting at sink;Wheelchair Upper Body Bathing: Setup Where Assessed-Upper Body Bathing: Shower Lower Body Bathing: Moderate assistance Where Assessed-Lower Body Bathing: Shower Upper Body Dressing: Setup Where Assessed-Upper Body Dressing: Wheelchair Lower Body Dressing: Maximal assistance Where Assessed-Lower Body Dressing: Secondary school teacher: Minimal assistance Film/video editor Method: Designer, industrial/product: Grab bars;Shower seat without back Mobility  Transfers Sit to Stand: Minimal Assistance - Patient > 75% Stand to Sit: Minimal Assistance - Patient > 75%   Discharge Criteria: Patient will be discharged from OT if patient refuses treatment 3 consecutive times without medical reason, if treatment goals not met, if there is a change in medical status, if patient makes no progress towards goals or if patient is discharged from hospital.  The above assessment, treatment plan, treatment alternatives and goals were  discussed and mutually agreed upon: by patient  Adan Sis 11/08/2023, 12:01 PM

## 2023-11-08 NOTE — Plan of Care (Signed)
  Problem: RH Balance Goal: LTG Patient will maintain dynamic standing with ADLs (OT) Description: LTG:  Patient will maintain dynamic standing balance with assist during activities of daily living (OT)  Flowsheets (Taken 11/08/2023 1202) LTG: Pt will maintain dynamic standing balance during ADLs with: Independent with assistive device   Problem: Sit to Stand Goal: LTG:  Patient will perform sit to stand in prep for activites of daily living with assistance level (OT) Description: LTG:  Patient will perform sit to stand in prep for activites of daily living with assistance level (OT) Flowsheets (Taken 11/08/2023 1202) LTG: PT will perform sit to stand in prep for activites of daily living with assistance level: Independent with assistive device   Problem: RH Bathing Goal: LTG Patient will bathe all body parts with assist levels (OT) Description: LTG: Patient will bathe all body parts with assist levels (OT) Flowsheets (Taken 11/08/2023 1202) LTG: Pt will perform bathing with assistance level/cueing: Independent with assistive device    Problem: RH Dressing Goal: LTG Patient will perform upper body dressing (OT) Description: LTG Patient will perform upper body dressing with assist, with/without cues (OT). Flowsheets (Taken 11/08/2023 1202) LTG: Pt will perform upper body dressing with assistance level of: Independent with assistive device Goal: LTG Patient will perform lower body dressing w/assist (OT) Description: LTG: Patient will perform lower body dressing with assist, with/without cues in positioning using equipment (OT) Flowsheets (Taken 11/08/2023 1202) LTG: Pt will perform lower body dressing with assistance level of: Independent with assistive device   Problem: RH Toileting Goal: LTG Patient will perform toileting task (3/3 steps) with assistance level (OT) Description: LTG: Patient will perform toileting task (3/3 steps) with assistance level (OT)  Flowsheets (Taken 11/08/2023  1202) LTG: Pt will perform toileting task (3/3 steps) with assistance level: Independent with assistive device   Problem: RH Toilet Transfers Goal: LTG Patient will perform toilet transfers w/assist (OT) Description: LTG: Patient will perform toilet transfers with assist, with/without cues using equipment (OT) Flowsheets (Taken 11/08/2023 1202) LTG: Pt will perform toilet transfers with assistance level of: Independent with assistive device   Problem: RH Tub/Shower Transfers Goal: LTG Patient will perform tub/shower transfers w/assist (OT) Description: LTG: Patient will perform tub/shower transfers with assist, with/without cues using equipment (OT) Flowsheets (Taken 11/08/2023 1202) LTG: Pt will perform tub/shower stall transfers with assistance level of: Supervision/Verbal cueing

## 2023-11-09 ENCOUNTER — Other Ambulatory Visit (HOSPITAL_COMMUNITY): Payer: Self-pay

## 2023-11-09 ENCOUNTER — Telehealth (HOSPITAL_COMMUNITY): Payer: Self-pay | Admitting: Pharmacy Technician

## 2023-11-09 DIAGNOSIS — S72001S Fracture of unspecified part of neck of right femur, sequela: Secondary | ICD-10-CM | POA: Diagnosis not present

## 2023-11-09 DIAGNOSIS — R7989 Other specified abnormal findings of blood chemistry: Secondary | ICD-10-CM | POA: Diagnosis not present

## 2023-11-09 DIAGNOSIS — D62 Acute posthemorrhagic anemia: Secondary | ICD-10-CM | POA: Diagnosis not present

## 2023-11-09 DIAGNOSIS — I824Y1 Acute embolism and thrombosis of unspecified deep veins of right proximal lower extremity: Secondary | ICD-10-CM

## 2023-11-09 DIAGNOSIS — I10 Essential (primary) hypertension: Secondary | ICD-10-CM | POA: Diagnosis not present

## 2023-11-09 LAB — BASIC METABOLIC PANEL
Anion gap: 11 (ref 5–15)
BUN: 21 mg/dL (ref 8–23)
CO2: 26 mmol/L (ref 22–32)
Calcium: 8.7 mg/dL — ABNORMAL LOW (ref 8.9–10.3)
Chloride: 99 mmol/L (ref 98–111)
Creatinine, Ser: 1 mg/dL (ref 0.61–1.24)
GFR, Estimated: >60 mL/min (ref >60)
Glucose, Bld: 107 mg/dL — ABNORMAL HIGH (ref 70–99)
Potassium: 4.2 mmol/L (ref 3.5–5.1)
Sodium: 136 mmol/L (ref 135–145)

## 2023-11-09 LAB — CBC
HCT: 29.5 % — ABNORMAL LOW (ref 39.0–52.0)
Hemoglobin: 10.2 g/dL — ABNORMAL LOW (ref 13.0–17.0)
MCH: 33.2 pg (ref 26.0–34.0)
MCHC: 34.6 g/dL (ref 30.0–36.0)
MCV: 96.1 fL (ref 80.0–100.0)
Platelets: 247 10*3/uL (ref 150–400)
RBC: 3.07 MIL/uL — ABNORMAL LOW (ref 4.22–5.81)
RDW: 12.7 % (ref 11.5–15.5)
WBC: 5.9 10*3/uL (ref 4.0–10.5)
nRBC: 0 % (ref 0.0–0.2)

## 2023-11-09 MED ORDER — SENNOSIDES-DOCUSATE SODIUM 8.6-50 MG PO TABS
2.0000 | ORAL_TABLET | Freq: Every day | ORAL | Status: DC
  Administered 2023-11-09 – 2023-11-16 (×6): 2 via ORAL
  Filled 2023-11-09 (×7): qty 2

## 2023-11-09 MED ORDER — APIXABAN 5 MG PO TABS
5.0000 mg | ORAL_TABLET | Freq: Two times a day (BID) | ORAL | Status: DC
  Administered 2023-11-16 – 2023-11-17 (×3): 5 mg via ORAL
  Filled 2023-11-09 (×3): qty 1

## 2023-11-09 MED ORDER — APIXABAN 5 MG PO TABS
10.0000 mg | ORAL_TABLET | Freq: Two times a day (BID) | ORAL | Status: AC
  Administered 2023-11-09 – 2023-11-15 (×14): 10 mg via ORAL
  Filled 2023-11-09 (×14): qty 2

## 2023-11-09 NOTE — Discharge Instructions (Addendum)
 Inpatient Rehab Discharge Instructions  Lucious Zou Discharge date and time:  11/17/23  Activities/Precautions/ Functional Status: Activity: no lifting, driving, or strenuous exercise till cleared by MD Diet: regular diet Wound Care: keep wound clean and dry. Contact Dr. Hulda Humphrey if you develop any problems with your incision/wound--redness, swelling, increase in pain, drainage or if you develop fever or chills.    Functional status:  ___ No restrictions     ___ Walk up steps independently ___ 24/7 supervision/assistance   ___ Walk up steps with assistance ___ Intermittent supervision/assistance  ___ Bathe/dress independently _X__ Walk with walker     ___ Bathe/dress with assistance ___ Walk Independently    ___ Shower independently ___ Walk with assistance    ___ Shower with assistance ___ No alcohol     ___ Return to work/school ________   Special Instructions: You are on aspirin and Eliquis which are blood thinners. Do not use any NSAIDS as this will increase you risk of bleeding. 2.  Need to keep right leg elevated on pillow or bolster. Ace wrap your leg from mid thigh to mid foot to help control swelling or can purchase thigh high support stockings to help with this. Need to limit salt intake.  3. Need to limit to 50 % weight on right leg for two weeks. Follow up with Dr. Hulda Humphrey for input on weight bearing restrictions.    COMMUNITY REFERRALS UPON DISCHARGE:    Home Health:   PT   &    OT                  Agency:[facility] Phone:[phone]   Medical Equipment/Items Ordered:YOUTH ROLLING WALKER AND 3 IN 1                                                 Agency/Supplier:[facility]   [phone]    My questions have been answered and I understand these instructions. I will adhere to these goals and the provided educational materials after my discharge from the hospital.  Patient/Caregiver Signature _______________________________ Date __________  Clinician Signature  _______________________________________ Date __________  Please bring this form and your medication list with you to all your follow-up doctor's appointments.   __________________________________________________________________________________________________________________________________________   Information on my medicine - ELIQUIS (apixaban)  This medication education was reviewed with me or my healthcare representative as part of my discharge preparation.   Why was Eliquis prescribed for you? Eliquis was prescribed to treat blood clots that may have been found in the veins of your legs (deep vein thrombosis) or in your lungs (pulmonary embolism) and to reduce the risk of them occurring again.  What do You need to know about Eliquis ? The dose is ONE 5 mg tablet taken TWICE daily.  Eliquis may be taken with or without food.   Try to take the dose about the same time in the morning and in the evening. If you have difficulty swallowing the tablet whole please discuss with your pharmacist how to take the medication safely.  Take Eliquis exactly as prescribed and DO NOT stop taking Eliquis without talking to the doctor who prescribed the medication.  Stopping may increase your risk of developing a new blood clot.  Refill your prescription before you run out.  After discharge, you should have regular check-up appointments with your healthcare provider that is prescribing  your Eliquis.    What do you do if you miss a dose? If a dose of ELIQUIS is not taken at the scheduled time, take it as soon as possible on the same day and twice-daily administration should be resumed. The dose should not be doubled to make up for a missed dose.  Important Safety Information A possible side effect of Eliquis is bleeding. You should call your healthcare provider right away if you experience any of the following: Bleeding from an injury or your nose that does not stop. Unusual colored urine  (red or dark brown) or unusual colored stools (red or black). Unusual bruising for unknown reasons. A serious fall or if you hit your head (even if there is no bleeding).  Some medicines may interact with Eliquis and might increase your risk of bleeding or clotting while on Eliquis. To help avoid this, consult your healthcare provider or pharmacist prior to using any new prescription or non-prescription medications, including herbals, vitamins, non-steroidal anti-inflammatory drugs (NSAIDs) and supplements.  This website has more information on Eliquis (apixaban): http://[email or web address].com/eliquis/home

## 2023-11-09 NOTE — Telephone Encounter (Signed)
 Patient Product/process development scientist completed.    The patient is insured through [employer]. Patient has Medicare and is not eligible for a copay card, but may be able to apply for patient assistance or Medicare RX Payment Plan (Patient Must reach out to their plan, if eligible for payment plan), if available.    Ran test claim for Eliquis 5 mg and the current 30 day co-pay is $142.14 due to a deductible.  Ran test claim for Eliquis Starter Pack and the current 30 day co-pay is $175.31 due to a deductible.  This test claim was processed through [facility]- copay amounts may vary at other pharmacies due to pharmacy/plan contracts, or as the patient moves through the different stages of their insurance plan.     Roland Earl, CPHT Pharmacy Technician III Certified Patient Advocate [facility] Patient Advocate Team Direct Number: [phone]  Fax: [phone]

## 2023-11-09 NOTE — Progress Notes (Signed)
 PHARMACY - ANTICOAGULATION CONSULT NOTE  Pharmacy Consult for Eliquis Indication: RLE DVT   No Known Allergies  Patient Measurements: Height: 5\' 6"  (167.6 cm) Weight: 87.4 kg (192 lb 10.9 oz) IBW/kg (Calculated) : 63.8  Vital Signs: Temp: 97.9 F (36.6 C) (02/27 0407) BP: 109/68 (02/27 0407) Pulse Rate: 82 (02/27 0407)  Labs: Recent Labs    11/07/23 0613 11/08/23 0531 11/09/23 0541  HGB 10.4* 10.1* 10.2*  HCT 29.9* 29.1* 29.5*  PLT 184 212 247  CREATININE 1.03 1.08 1.00    Estimated Creatinine Clearance: 68.1 mL/min (by C-G formula based on SCr of 1 mg/dL).   Medical History: Past Medical History:  Diagnosis Date   Aortic stenosis    Ascending aortic aneurysm (HCC) 09/18/2018   Colon cancer (HCC)    Family history of adverse reaction to anesthesia    pt states his mother had 2 episodes of hyponatremia following anesthesia    GERD (gastroesophageal reflux disease)    Headache    Heart murmur    Hyperlipidemia 10/25/2016   Hypertension    pt is currently not taking any medications; pt states is borderline   Melanoma (HCC)    OSA on CPAP 09/18/2018   PAF (paroxysmal atrial fibrillation) (HCC) 06/13/2017   Perforated sigmoid colon (HCC)    PVC's (premature ventricular contractions) 10/25/2016   Tinnitus    Wears glasses    Assessment: 73 yr old male admitted to CIR on 2/25 to begin Eliquis for RLE DVT per duplex 11/08/23 pm. Hx R femur cephalomedullary nail on 11/03/23 after fracture due to fall PTA.  Initially on SCDs post-op, Enoxaparin 40 mg SQ q24h begun 2/25 pm, last dose 11/08/23 at 1817.   Post-op drop in CBC, platelet count dropped to 122 on 2/23, back into low normal range on 2/24 and up to 247. Hgb stable is low 10s x 5 days. No bleeding noted.  Goal of Therapy:  Appropriate Eliquis regimen for indication Monitor platelets by anticoagulation protocol: Yes   Plan:  Eliquis 10 mg PO BID x 7 days, then 5 mg PO BID. Intermittent CBC. Monitor for  signs/symptoms of bleeding  Dennie Fetters, RPh 11/09/2023,10:14 AM

## 2023-11-09 NOTE — Progress Notes (Signed)
 PROGRESS NOTE   Subjective/Complaints: DVT noted on vascular ultrasound yesterday right proximal profunda vein.  No additional complaints or concerns.  Pain under control overall.  ROS: Patient denies fever, chills, nausea, vomiting, diarrhea,  shortness of breath or chest pain, headache, or mood change.   Objective:   VAS [place] LOWER EXTREMITY VENOUS (DVT) Result Date: 11/08/2023  Lower Venous DVT Study Patient Name:  [name] Brown  Date of Exam:   11/08/2023 Medical Rec #: [id number]     Accession #:    [id number] Date of Birth: [birth year]    Patient Gender: M Patient Age:   73 years Exam Location:  [facility] Procedure:      VAS [place] LOWER EXTREMITY VENOUS (DVT) Referring Phys: PAMELA LOVE --------------------------------------------------------------------------------  Indications: Swelling, and Post-op. Other Indications: Right femur fracture. Comparison Study: No prior exam. Performing Technologist: Fernande Bras  Examination Guidelines: A complete evaluation includes B-mode imaging, spectral Doppler, color Doppler, and power Doppler as needed of all accessible portions of each vessel. Bilateral testing is considered an integral part of a complete examination. Limited examinations for reoccurring indications may be performed as noted. The reflux portion of the exam is performed with the patient in reverse Trendelenburg.  +---------+---------------+---------+-----------+----------+--------------+ RIGHT    CompressibilityPhasicitySpontaneityPropertiesThrombus Aging +---------+---------------+---------+-----------+----------+--------------+ CFV      Full           Yes      Yes                                 +---------+---------------+---------+-----------+----------+--------------+ SFJ      Full           Yes      Yes                                  +---------+---------------+---------+-----------+----------+--------------+ FV Prox  Full                                                        +---------+---------------+---------+-----------+----------+--------------+ FV Mid   Full                                                        +---------+---------------+---------+-----------+----------+--------------+ FV DistalFull                                                        +---------+---------------+---------+-----------+----------+--------------+ PFV      Partial        Yes      Yes                                 +---------+---------------+---------+-----------+----------+--------------+  POP      Full           Yes      Yes                                 +---------+---------------+---------+-----------+----------+--------------+ PTV      Full                                                        +---------+---------------+---------+-----------+----------+--------------+ PERO     Full                                                        +---------+---------------+---------+-----------+----------+--------------+ A small segment of the right profunda vein is thrombosed.  +----+---------------+---------+-----------+----------+--------------+ LEFTCompressibilityPhasicitySpontaneityPropertiesThrombus Aging +----+---------------+---------+-----------+----------+--------------+ CFV Full           Yes      Yes                                 +----+---------------+---------+-----------+----------+--------------+ SFJ Full           Yes      Yes                                 +----+---------------+---------+-----------+----------+--------------+    Summary: RIGHT: - Findings consistent with acute deep vein thrombosis involving the right proximal profunda vein.  - No cystic structure found in the popliteal fossa.  LEFT: - No evidence of common femoral vein obstruction.   *See table(s) above for  measurements and observations. Electronically signed by Coral Else MD on 11/08/2023 at 8:39:34 PM.    Final    VAS [place] UPPER EXTREMITY VENOUS DUPLEX Result Date: 11/08/2023 UPPER VENOUS STUDY  Patient Name:  [name] Brown  Date of Exam:   11/07/2023 Medical Rec #: [id number]     Accession #:    [id number] Date of Birth: [birth year]    Patient Gender: M Patient Age:   73 years Exam Location:  [facility] Procedure:      VAS [place] UPPER EXTREMITY VENOUS DUPLEX Referring Phys: PAMELA LOVE --------------------------------------------------------------------------------  Indications: Immobility Risk Factors: Immobility Cancer Colon Surgery IM Nail 11/03/23. Comparison Study: None. Performing Technologist: Shona Simpson  Examination Guidelines: A complete evaluation includes B-mode imaging, spectral Doppler, color Doppler, and power Doppler as needed of all accessible portions of each vessel. Bilateral testing is considered an integral part of a complete examination. Limited examinations for reoccurring indications may be performed as noted.  Right Findings: +----------+------------+---------+-----------+----------+-------+ RIGHT     CompressiblePhasicitySpontaneousPropertiesSummary +----------+------------+---------+-----------+----------+-------+ IJV           Full       Yes       Yes                      +----------+------------+---------+-----------+----------+-------+ Subclavian    Full       Yes       Yes                      +----------+------------+---------+-----------+----------+-------+  Axillary      Full       Yes       Yes                      +----------+------------+---------+-----------+----------+-------+ Brachial      Full       Yes       Yes                      +----------+------------+---------+-----------+----------+-------+ Radial        Full       Yes       Yes                      +----------+------------+---------+-----------+----------+-------+  Ulnar         Full       Yes       Yes                      +----------+------------+---------+-----------+----------+-------+ Cephalic      Full                 Yes                      +----------+------------+---------+-----------+----------+-------+ Basilic       Full                 Yes                      +----------+------------+---------+-----------+----------+-------+  Left Findings: +----------+------------+---------+-----------+------------------+-------+ LEFT      CompressiblePhasicitySpontaneous    Properties    Summary +----------+------------+---------+-----------+------------------+-------+ IJV           Full       Yes       Yes                              +----------+------------+---------+-----------+------------------+-------+ Subclavian    Full       Yes       Yes                              +----------+------------+---------+-----------+------------------+-------+ Axillary      Full       Yes       Yes                              +----------+------------+---------+-----------+------------------+-------+ Brachial      Full       Yes       Yes                              +----------+------------+---------+-----------+------------------+-------+ Radial        Full       Yes       Yes                              +----------+------------+---------+-----------+------------------+-------+ Ulnar         Full       Yes       Yes                              +----------+------------+---------+-----------+------------------+-------+  Cephalic      None       No        No     brightly echogenic Acute  +----------+------------+---------+-----------+------------------+-------+ Basilic       Full                 Yes                              +----------+------------+---------+-----------+------------------+-------+ Acute SVT seen in cephalic vein in the proximal forearm around and proximal to the IV line.  Summary:   Right: No evidence of deep vein thrombosis in the upper extremity. No evidence of superficial vein thrombosis in the upper extremity.  Left: No evidence of deep vein thrombosis in the upper extremity. Findings consistent with acute superficial vein thrombosis involving the left cephalic vein.  *See table(s) above for measurements and observations.  Diagnosing physician: Coral Else MD Electronically signed by Coral Else MD on 11/08/2023 at 4:42:00 PM.    Final    Recent Labs    11/08/23 0531 11/09/23 0541  WBC 6.5 5.9  HGB 10.1* 10.2*  HCT 29.1* 29.5*  PLT 212 247   Recent Labs    11/08/23 0531 11/09/23 0541  NA 133* 136  K 4.1 4.2  CL 102 99  CO2 26 26  GLUCOSE 111* 107*  BUN 20 21  CREATININE 1.08 1.00  CALCIUM 8.1* 8.7*    Intake/Output Summary (Last 24 hours) at 11/09/2023 0846 Last data filed at 11/09/2023 0835 Gross per 24 hour  Intake 388.5 ml  Output 1150 ml  Net -761.5 ml        Physical Exam: Vital Signs Blood pressure 109/68, pulse 82, temperature 97.9 F (36.6 C), resp. rate 18, height 5\' 6"  (1.676 m), weight 87.4 kg, SpO2 93%.  Physical Exam:  General: NAD HEENT: Head is normocephalic, atraumatic, PERRLA, EOMI, sclera anicteric, oral mucosa moist Neck: Supple without JVD or lymphadenopathy Heart: Reg rate and rhythm. + murmur Chest: CTA bilaterally without wheezes, rales, or rhonchi; no distress Abdomen: Soft, non-tender, Mildly distended, bowel sounds positive. Small umbilical hernia. Extremities: No clubbing, cyanosis, or edema. Pulses are 2+ Psych: Pt's affect is appropriate. Pt is cooperative Skin: Clean and intact without signs of breakdown Neuro:     Mental Status: He is alert and oriented to person, place, and time.     Comments: Intact to light touch in all 4 extremities  Musculoskeletal:    Cervical back: Neck supple. No tenderness.     Comments: 2 + edema right thigh, knee and foot. Three surgical dressing in place on right hip/thigh UE  strength 5/5 in B/L Ue's  LLE- 5/5 in HF, KE, KF, DF and PF RLE HF 2-/5; KE/KF 2-/5; DF/PF 5/5  Exam Stable 2/27 Assessment/Plan: 1. Functional deficits which require 3+ hours per day of interdisciplinary therapy in a comprehensive inpatient rehab setting. Physiatrist is providing close team supervision and 24 hour management of active medical problems listed below. Physiatrist and rehab team continue to assess barriers to discharge/monitor patient progress toward functional and medical goals  Care Tool:  Bathing    Body parts bathed by patient: Right arm, Left arm, Chest, Abdomen, Front perineal area, Buttocks, Right upper leg, Left upper leg, Face   Body parts bathed by helper: Right lower leg, Left lower leg     Bathing assist Assist Level: Minimal Assistance - Patient > 75%     Upper  Body Dressing/Undressing Upper body dressing   What is the patient wearing?: Pull over shirt    Upper body assist Assist Level: Set up assist    Lower Body Dressing/Undressing Lower body dressing      What is the patient wearing?: Underwear/pull up, Pants     Lower body assist Assist for lower body dressing: Maximal Assistance - Patient 25 - 49%     Toileting Toileting    Toileting assist Assist for toileting: Minimal Assistance - Patient > 75%     Transfers Chair/bed transfer  Transfers assist     Chair/bed transfer assist level: Minimal Assistance - Patient > 75%     Locomotion Ambulation   Ambulation assist      Assist level: Contact Guard/Touching assist Assistive device: Walker-rolling Max distance: 20   Walk 10 feet activity   Assist     Assist level: Contact Guard/Touching assist Assistive device: Walker-rolling   Walk 50 feet activity   Assist Walk 50 feet with 2 turns activity did not occur: Safety/medical concerns (pain/fatigue)         Walk 150 feet activity   Assist Walk 150 feet activity did not occur: Safety/medical concerns          Walk 10 feet on uneven surface  activity   Assist Walk 10 feet on uneven surfaces activity did not occur: Safety/medical concerns         Wheelchair     Assist Is the patient using a wheelchair?: Yes Type of Wheelchair: Manual    Wheelchair assist level: Supervision/Verbal cueing Max wheelchair distance: 150    Wheelchair 50 feet with 2 turns activity    Assist        Assist Level: Supervision/Verbal cueing   Wheelchair 150 feet activity     Assist      Assist Level: Supervision/Verbal cueing   Blood pressure 109/68, pulse 82, temperature 97.9 F (36.6 C), resp. rate 18, height 5\' 6"  (1.676 m), weight 87.4 kg, SpO2 93%.  Medical Problem List and Plan: 1. Functional deficits secondary to R femur fx s/p IM nail- 25% PWB x 2 weeks, then 50 % for 2 weeks, then WBAT             -patient may  shower- cover incision             -ELOS/Goals: 7-10 days mod I             -Continue CIR, evaluations today  -Team conference completed yesterday, expected discharge pending as he was pending evaluations  -2/27 bed level activity due to DVT 2.  Antithrombotics: -DVT/anticoagulation:  Pharmaceutical: Lovenox added             -antiplatelet therapy: ASA daily-->increase to BID at discharge.  3. Pain Management:  Currently dilaudid 1 mg IV qid w/ oxycodone 5 mg qid. --increase oxycodone to 5-10 mg and premedicate prior to therapy- stop IV Dilaudid --ice prn. Flexeril prn for muscle spasms.  4. Mood/Behavior/Sleep: Monitor sleep hygiene. Continue delirium precautions             --Repuip at nights for RLS. Trazodone prn for sleep.             -antipsychotic agents: N/A  2/27 scheduled for neuropsych to report this afternoon for therapy of decreased mood.  Consider addition of SSRI 5. Neuropsych/cognition: This patient is capable of making decisions on his own behalf. 6. Skin/Wound Care: Routine pressure relief measures.  7. Fluids/Electrolytes/Nutrition: Monitor I/O.  Check  CMET in am.  8. IM nail Right femur: 25% PWB thorough 03/07  --followed by 50% WB X 2 weeks then WBAT 9. HTN: Monitor BP TID--on Cozaar and Cardizem.  -2/27 BP stable, continue to follow    11/09/2023    4:07 AM 11/08/2023    7:37 PM 11/08/2023   12:16 PM  Vitals with BMI  Weight   192 lbs 11 oz  BMI   31.11  Systolic 109 115 161  Diastolic 68 62 73  Pulse 82 89 91     10. Non-obstructive CAD w/recovered EF: Continue Losartan, ASA and statin.  11.  Abnormal LFTs: Recheck labs in am  2/26 AST still elevated at 70, avoid hepatotoxic medication and monitor.  Change Tylenol to every 6 hours as needed from every 4 hours as needed.  -2/27 today patient indicated increased alcohol use to therapy that may be contributing to this 12. ABLA: Recheck CBC in am  -2/27 hemoglobin stable at 10.2 13. Constipation: Will schedule Senna with supper. Doesn't want Sorbitol   -2/26 LMB late last night, continue to monitor   2/27 LBM yesterday, continue to monitor 14. RLE edema- 2+ from R hip to R foot- monitor- might need dopplers  -2/27 DVT right proximal profunda, start Eliquis in bed level activity today      LOS: 2 days A FACE TO FACE EVALUATION WAS PERFORMED  Fanny Dance 11/09/2023, 8:46 AM

## 2023-11-09 NOTE — Progress Notes (Signed)
 Physical Therapy Session Note  Patient Details  Name: [name] Brown MRN: [id number] Date of Birth: 1951/07/22  Today's Date: 11/09/2023 PT Individual Time: [phone] PT Individual Time Calculation (min): 78 min, 46 min   Short Term Goals: Week 1:  PT Short Term Goal 1 (Week 1): Pt will perform sit to stand with RW and supervision PT Short Term Goal 2 (Week 1): Pt will perform stand pivot transfer with RW and supervision PT Short Term Goal 3 (Week 1): pt will ambulate 50 feet with RW and CGA  Skilled Therapeutic Interventions/Progress Updates:      Treatment Session 1  Pt supine in bed upon arrival. Pt agreeable to therapy. MD present for AM rounds. MD reports proximal DVT found in R LE, and starting blood thinners. MD gives verbal order for bed level only today 2/27. Notified therapy team and nurse/nurse tech. Education provided to pt on reasoning for bed level therapy. Encouraged pt to advocate for self and not to get OOB until cleared by MD, education provided on need to utilize bed pan and urinal. Pt verbalized understanding and agreeable.   Pt reports 4/10 pain in R hip, premedicated. Therpaist provided rest breaks and repositioning throughout session.   Pt requesting to change clothes, pt donned boxers and shorts with min A to feed R LE through (to knee), pt then able to feed L LE and donn over buttocks with use of L LE single leg bridge technique. Pt requires HOB elevated to ~31 deg to feed legs through at this time.   Pt doffed shirt while seated EOB with mod I, and donned shirt with min A while supine in ebd with HOB elevated. Donned ted hose with total A.   Pt reports it was easier to use the urinal this AM 2/2 to improved ability to move R LE.   Pt reports he has been doing his exercises off and on, pt performed the following therex for B LE strengthening/ROM  Pt performed 1x15 ankle pumps B  1x15 quads sets holding for 5 seconds B  1x15 glute sets holding for 5  seconds B   2x15 R LE SAQ with little to no knee extension lag  2x10 active assisted supine heel slides-pt demos empty end feel at ~30 deg knee flexion-pt reports pulling/stretch sensation in anterior quad  2x10 single leg bridge with therapist providing assist for R LE  1x10 L LE hip abduction   1x10 L LE SLR   Therapist provided education regarding  pt individualized handbook. Therapist reviewed the following topics with pt: pt surgery, pt weight bearing precautions post surgery, common signs and symptoms post surgery, importance of recognizing signs and symptoms of infection and notifying provider if pt notices any signs, pain/edema management with use of ice/elevation, HEP, role of PT/OT. Therapist introduced role of neuropsych. Pt opened up regarding depression at baseline 2/2 age related health issues, weight gain, vision deficits, pt endorese poor coping strategies with 3-4 ETOH drinks per night. Therapist encouraged pt to do consult with neuropsych to assist with development of healthier coping strategies. Therapist emphasized importance of not drinking while using RW and with weight bearing precautions 2/2 increased fall risk. Pt very appreciative of this discussion and recommendation, therapist notified MD and social worker.    Pt demos teach back of elevating R LE for edema management. Pt supine in bed at end of session with R LE elevated, bed alarm on and needs within reach.   Treatment Session 2   Pt supine  in bed upon arrival. Pt agreeable to therapy. Pt reports 6/10 pain in R LE, pt requesting pain medication, therapist notified nurse, nurse present to administer medication. Therapist provided rest breaks and repositioning, and donned ice to R hip with approval from PA.   Therapist verified with PA, pt okay to do activity seated EOB, however limit hip mobility 2/2 proximal DVT.   Supine to sit with use of gait belt and supervision, with increased time.   Pt performed the following  therex for B LE strengthening/ROM/activity tolerance/independence with mobility   2x10 active assist R LE heel slides-empty end feel at ~70 knee flexion, pt doing majority of activation, and pt facilitating motion within appropriate sagittal plane as pt demos tendency to abduct hip  2x10 LAQ, pt demos improved activation and ROM today  2x15 ankle pumps while seated EOB  2x15 glute sets while seated EOB  Sit to supine with min A for R LE, pt scooted up in bed with verbal cues for technique and UE positioning.   Pt supine in bed with all needs within reach and bed alarm on.     Therapy Documentation Precautions:  Precautions Precautions: Fall Restrictions Weight Bearing Restrictions Per Provider Order: Yes RLE Weight Bearing Per Provider Order: Touchdown weight bearing RLE Partial Weight Bearing Percentage or Pounds: 25% until 3/7, then 50% WB for 2 weeks, then WBAT  Therapy/Group: Individual Therapy  [facility] Ambrose Finland, [place], DPT  11/09/2023, 7:43 AM

## 2023-11-09 NOTE — Progress Notes (Signed)
 Occupational Therapy Session Note  Patient Details  Name: [name] Brown MRN: [id number] Date of Birth: 06-30-1951  Today's Date: 11/09/2023 OT Individual Time: 7829-5621 OT Individual Time Calculation (min): 34 min  26 mins missed as pt on bed level therapies   Short Term Goals: Week 1:  OT Short Term Goal 1 (Week 1): Pt will perform toileting tasks with contact guard for balance OT Short Term Goal 2 (Week 1): Pt will thread LB clothing (underwear and pants) with superivsion with AE prn OT Short Term Goal 3 (Week 1): Pt will be given HEP for right Le to improve mobility for ADL performance  Skilled Therapeutic Interventions/Progress Updates:  Pt greeted supine in bed, pt agreeable to OT intervention.    Pt on bed level therapy only today d/t DVT in RLE therefore session limited to bed level.    ADLs:  Grooming: pt washed face from bed level with set- up assist UB dressing:donned gown dependently LB dressing: pt able to doff underwear and pants with MINA from bed level via bridging and rolling in bed with only CGA- supervision.   Bathing: pt completed bathing from bed level with set- up assist Toileting: pt able to use urinal with set- up assist from chair position in bed. Continent urine void.   Education: discussed DME needs with pt not needing any DME from OT standpoint at this time. Pt does want a hospital bed however suggested pt wait to see how he progresses. Edcuation provided on use of leg lifter for bed mobility with  pt able to return demo use of AD with min verbal cues for technique.    Ended session with pt supine in bed with all needs within reach and bed alarm activated.                    Therapy Documentation Precautions:  Precautions Precautions: Fall Restrictions Weight Bearing Restrictions Per Provider Order: Yes RLE Weight Bearing Per Provider Order: Touchdown weight bearing RLE Partial Weight Bearing Percentage or Pounds: 25% General: General OT Amount  of Missed Time: 26 Minutes  Pain: No pain    Therapy/Group: Individual Therapy  Pollyann Glen [facility] 11/09/2023, 12:24 PM

## 2023-11-09 NOTE — Plan of Care (Signed)
  Problem: RH Balance Goal: LTG Patient will maintain dynamic sitting balance (PT) Description: LTG:  Patient will maintain dynamic sitting balance with assistance during mobility activities (PT) Flowsheets (Taken 11/09/2023 0728) LTG: Pt will maintain dynamic sitting balance during mobility activities with:: Independent with assistive device  Goal: LTG Patient will maintain dynamic standing balance (PT) Description: LTG:  Patient will maintain dynamic standing balance with assistance during mobility activities (PT) Flowsheets (Taken 11/09/2023 0728) LTG: Pt will maintain dynamic standing balance during mobility activities with:: Independent with assistive device    Problem: Sit to Stand Goal: LTG:  Patient will perform sit to stand with assistance level (PT) Description: LTG:  Patient will perform sit to stand with assistance level (PT) Flowsheets (Taken 11/09/2023 0728) LTG: PT will perform sit to stand in preparation for functional mobility with assistance level: Independent with assistive device   Problem: RH Bed Mobility Goal: LTG Patient will perform bed mobility with assist (PT) Description: LTG: Patient will perform bed mobility with assistance, with/without cues (PT). Flowsheets (Taken 11/09/2023 0728) LTG: Pt will perform bed mobility with assistance level of: Independent with assistive device    Problem: RH Bed to Chair Transfers Goal: LTG Patient will perform bed/chair transfers w/assist (PT) Description: LTG: Patient will perform bed to chair transfers with assistance (PT). Flowsheets (Taken 11/09/2023 0728) LTG: Pt will perform Bed to Chair Transfers with assistance level: Independent with assistive device    Problem: RH Car Transfers Goal: LTG Patient will perform car transfers with assist (PT) Description: LTG: Patient will perform car transfers with assistance (PT). Flowsheets (Taken 11/09/2023 0728) LTG: Pt will perform car transfers with assist:: Supervision/Verbal  cueing   Problem: RH Furniture Transfers Goal: LTG Patient will perform furniture transfers w/assist (OT/PT) Description: LTG: Patient will perform furniture transfers  with assistance (OT/PT). Flowsheets (Taken 11/09/2023 0728) LTG: Pt will perform furniture transfers with assist:: Supervision/Verbal cueing   Problem: RH Ambulation Goal: LTG Patient will ambulate in controlled environment (PT) Description: LTG: Patient will ambulate in a controlled environment, # of feet with assistance (PT). Flowsheets (Taken 11/09/2023 0728) LTG: Pt will ambulate in controlled environ  assist needed:: Supervision/Verbal cueing LTG: Ambulation distance in controlled environment: 50 feet with LRAD Goal: LTG Patient will ambulate in home environment (PT) Description: LTG: Patient will ambulate in home environment, # of feet with assistance (PT). Flowsheets (Taken 11/09/2023 0731) LTG: Pt will ambulate in home environ  assist needed:: Supervision/Verbal cueing LTG: Ambulation distance in home environment: 25 feet with LRAD   Problem: RH Wheelchair Mobility Goal: LTG Patient will propel w/c in controlled environment (PT) Description: LTG: Patient will propel wheelchair in controlled environment, # of feet with assist (PT) Flowsheets (Taken 11/09/2023 0731) LTG: Pt will propel w/c in controlled environ  assist needed:: Independent with assistive device LTG: Propel w/c distance in controlled environment: 150 feet Goal: LTG Patient will propel w/c in home environment (PT) Description: LTG: Patient will propel wheelchair in home environment, # of feet with assistance (PT). Flowsheets (Taken 11/09/2023 0731) LTG: Pt will propel w/c in home environ  assist needed:: Independent with assistive device LTG: Propel w/c distance in home environment: 75 feet

## 2023-11-10 DIAGNOSIS — D62 Acute posthemorrhagic anemia: Secondary | ICD-10-CM | POA: Diagnosis not present

## 2023-11-10 DIAGNOSIS — I10 Essential (primary) hypertension: Secondary | ICD-10-CM | POA: Diagnosis not present

## 2023-11-10 DIAGNOSIS — S72001S Fracture of unspecified part of neck of right femur, sequela: Secondary | ICD-10-CM | POA: Diagnosis not present

## 2023-11-10 DIAGNOSIS — R7989 Other specified abnormal findings of blood chemistry: Secondary | ICD-10-CM | POA: Diagnosis not present

## 2023-11-10 MED ORDER — MAGNESIUM CITRATE PO SOLN
0.5000 | Freq: Once | ORAL | Status: AC
  Administered 2023-11-10: 1 via ORAL
  Filled 2023-11-10: qty 296

## 2023-11-10 MED ORDER — BISACODYL 10 MG RE SUPP: 10.0000 mg | Freq: Every day | RECTAL | Status: DC | PRN

## 2023-11-10 NOTE — Progress Notes (Signed)
 Physical Therapy Session Note  Patient Details  Name: [name] Brown MRN: [id number] Date of Birth: 05-Jun-1951  Today's Date: 11/10/2023 PT Individual Time: 4098-1191 PT Individual Time Calculation (min): 60 min   Short Term Goals: Week 1:  PT Short Term Goal 1 (Week 1): Pt will perform sit to stand with RW and supervision PT Short Term Goal 2 (Week 1): Pt will perform stand pivot transfer with RW and supervision PT Short Term Goal 3 (Week 1): pt will ambulate 50 feet with RW and CGA  Skilled Therapeutic Interventions/Progress Updates:      Clarified with PA re: yesterday's bed level therapy restrictions - pt cleared without restrictions. Pt sitting in wheelchair to start - has complaints of mild RLE pain, unrated. Pt propels himself at wheelchair level mod I ~163ft to ortho rehab gym. Completes stand pivot transfer with CGA and RW to mat table, compliant with WB restrictions.  Active R knee ROM tested: Lacks 5deg of knee extension 40deg of knee flexion  RLE strengthening completed on mat table: -RLE heel slides (AAROM for end range) -RLE SAQ with bolster -RLE SAQ + 5 second knee isometric with bolster -RLE heel slides with RLE on physioball -RLE passive knee flexion ROM *2x10 reps each, rest breaks as needed for recovery  Sit<>stand to RW from mat table with CGA - ambulates from ortho rehab gym to his room ~172ft with supervision and RW - min cues for WB restrictions (25%) - pt complains of UE fatigue from supporting himself through RW.   Pt requesting to lie down in bed to rest - assist required for RLE management only. Ended treatment with all needs met, patient in bed, call bell in reach.   Therapy Documentation Precautions:  Precautions Precautions: Fall Restrictions Weight Bearing Restrictions Per Provider Order: Yes RLE Weight Bearing Per Provider Order: Touchdown weight bearing RLE Partial Weight Bearing Percentage or Pounds: 25% General:      Therapy/Group:  Individual Therapy  Orrin Brigham 11/10/2023, 7:57 AM

## 2023-11-10 NOTE — Progress Notes (Signed)
 Physical Therapy Session Note  Patient Details  Name: [name] Brown MRN: [id number] Date of Birth: 08-22-1951  Today's Date: 11/10/2023 PT Individual Time: 7829-5621 PT Individual Time Calculation (min): 60 min   Short Term Goals: Week 1:  PT Short Term Goal 1 (Week 1): Pt will perform sit to stand with RW and supervision PT Short Term Goal 2 (Week 1): Pt will perform stand pivot transfer with RW and supervision PT Short Term Goal 3 (Week 1): pt will ambulate 50 feet with RW and CGA  Skilled Therapeutic Interventions/Progress Updates: Pt presented in bed agreeable to therapy. Pt c/o pain 5/10, nsg notified and pain meds received during session. PTA verified with nsg no complications therefore followed order that cleared pt off bedrest. Pt completed ankle pumps x 20, QS x 10, and AA hip abd/add x 10. PTA donned TED hose total A. Pt initially requesting to use gait belt for LE assist but agreeable to have PTA provide assist to allow pt to engage more. Pt was able to complete supine to sit at EOB with minA only for RLE management. Pt noted to be able to adduct RLE in small increments with PTA lightly supporting LE. Pt required increased time for repositioning and noted moderate guarding and offloading of R hip. PTA threaded brief, shorts, and grip socks total A. Pt stood from bed with CGA and was able to pull up brief with CGA and shorts when PTA pulled up to knees. Pt then hopped to sink noting NWB vs TDWB. W/c provided at sink and pt sat with CGA however decreased eccentric control for last ~25%. Pt then completed oral hygiene, washed face, and donned shirt with set up. Pt propelled to ortho gym mod I. In gym worked on gentle ROM performing Sit to stand with CGA from w/c and use of RW and performed toe tap to colored dot on floor x 15 emphasis on hip and knee flexion. Pt also performed standing hamstring curls WLP x 10. Worked on ambulation ~77ft with RW and CGA fading to close supervision. PTA  reinforced TDWB vs NWB to decrease excessive weight on BUE and maintain a step to patter. Pt able to maintain and demonstrate good sequencing however stated preferred NWB as was "faster". Pt also felt that RW was a but too high. Upon assessment RW noted to sit above wrist however unable to lower, PTA obtained YRW to adjust slightly with pt stating significant improvement. Pt then propelled back to room in same manner as prior and left in w/c at end of session with call bell within reach and needs met.      Therapy Documentation Precautions:  Precautions Precautions: Fall Restrictions Weight Bearing Restrictions Per Provider Order: Yes RLE Weight Bearing Per Provider Order: Touchdown weight bearing RLE Partial Weight Bearing Percentage or Pounds: 25% General:   Vital Signs:   Pain: Pain Assessment Pain Scale: 0-10 Pain Score: 5  Pain Type: Acute pain Pain Location: Leg Pain Orientation: Right Pain Descriptors / Indicators: Aching Pain Frequency: Intermittent Pain Onset: With Activity Pain Intervention(s): Medication (See eMAR) Other Treatments:      Therapy/Group: Individual Therapy  Iden Stripling 11/10/2023, 11:35 AM

## 2023-11-10 NOTE — Progress Notes (Signed)
 Occupational Therapy Session Note  Patient Details  Name: Freddie Dymek MRN: [id number] Date of Birth: [birth year]  Today's Date: 11/10/2023 OT Individual Time: 4098-1191 OT Individual Time Calculation (min): 72 min    Short Term Goals: Week 1:  OT Short Term Goal 1 (Week 1): Pt will perform toileting tasks with contact guard for balance OT Short Term Goal 2 (Week 1): Pt will thread LB clothing (underwear and pants) with superivsion with AE prn OT Short Term Goal 3 (Week 1): Pt will be given HEP for right Le to improve mobility for ADL performance  Skilled Therapeutic Interventions/Progress Updates:    Pt received supine with no c/o pain, agreeable to OT session. Pt completed bed mobility to EOB with gait belt to manage RLE to EOB, (S). He completed sit > stand from EOB with the RW with CGA. He completed functional mobility to the bathroom with CGA using the RW. Min cueing for TDWB. Problem solved through home set up, spend significant time discharge planning and discussing DME needs. Pt completed several BSC transfers over toilet, OT adjusting height and providing solutions for RLE support. Pt will need BSC at home to safely access his bathroom. Pt then returned to the w/c, about 5 ft. He propelled the w/c to the therapy gym with mod I. Problem solved through walk in shower transfer. Provided demo and DME examples. He completed transfer in/out of shower x2 with rest break between trials to problem solve through RLE and RW placement. He was able to complete this with close CGA each time. Pt completed the BUE ergometer to challenge BUE strength and endurance needed to complete ADLs and IADLs with the highest level of independence. Pt completed 6 min forward and 6 minute backward with cueing for pacing and technique. He then completed 2x10 shoulder press superset with scapular retraction and chest closing holding 5 lb dumbbells. He completed 100 ft of functional mobility to his room with the RW, CGA-(S)  overall. Pt was left sitting up in the w/c with all needs met and call bell within reach.   Therapy Documentation Precautions:  Precautions Precautions: Fall Restrictions Weight Bearing Restrictions Per Provider Order: Yes RLE Weight Bearing Per Provider Order: Touchdown weight bearing RLE Partial Weight Bearing Percentage or Pounds: 25%  Therapy/Group: Individual Therapy  Crissie Reese 11/10/2023, 6:16 AM

## 2023-11-10 NOTE — IPOC Note (Signed)
 Overall Plan of Care [facility]) Patient Details Name: [name] Brown MRN: [id number] DOB: Feb 06, 1951  Admitting Diagnosis: Fracture, proximal femur, right, sequela  Hospital Problems: Principal Problem:   Fracture, proximal femur, right, sequela     Functional Problem List: Nursing Pain, Endurance, Medication Management, Safety  PT Balance, Edema, Endurance, Motor, Pain, Sensory  OT Balance, Pain, Cognition, Edema, Endurance, Motor, Safety, Sensory, Skin Integrity  SLP    TR         Basic ADL's: OT Bathing, Dressing, Toileting     Advanced  ADL's: OT       Transfers: PT Bed to Chair, Bed Mobility, Car  OT Toilet, Tub/Shower     Locomotion: PT Ambulation, Wheelchair Mobility     Additional Impairments: OT None  SLP        TR      Anticipated Outcomes Item Anticipated Outcome  Self Feeding n/a  Swallowing      Basic self-care  mod I  Toileting  mod I   Bathroom Transfers mod I  Bowel/Bladder  n/a  Transfers  mod I  Locomotion  supervision  Communication     Cognition     Pain  < 4 with prns  Safety/Judgment  manage safety w cues   Therapy Plan: PT Intensity: Minimum of 1-2 x/day ,45 to 90 minutes PT Frequency: 5 out of 7 days PT Duration Estimated Length of Stay: 10-12 days OT Intensity: Minimum of 1-2 x/day, 45 to 90 minutes OT Frequency: 5 out of 7 days OT Duration/Estimated Length of Stay: 10-12 days     Team Interventions: Nursing Interventions Patient/Family Education, Pain Management, Medication Management, Discharge Planning, Skin Care/Wound Management, Disease Management/Prevention  PT interventions Ambulation/gait training, Community reintegration, DME/adaptive equipment instruction, Psychosocial support, Neuromuscular re-education, Museum/gallery curator, Wheelchair propulsion/positioning, UE/LE Strength taining/ROM, Warden/ranger, Discharge planning, Pain management, Skin care/wound management, Therapeutic Activities, UE/LE  Coordination activities, Cognitive remediation/compensation, Disease management/prevention, Functional mobility training, Patient/family education, Splinting/orthotics, Therapeutic Exercise, Visual/perceptual remediation/compensation  OT Interventions Warden/ranger, Community reintegration, Neuromuscular re-education, Patient/family education, Self Care/advanced ADL retraining, Therapeutic Exercise, UE/LE Coordination activities, Discharge planning, DME/adaptive equipment instruction, Functional mobility training, Pain management, Psychosocial support, Skin care/wound managment, Therapeutic Activities, UE/LE Strength taining/ROM  SLP Interventions    TR Interventions    SW/CM Interventions Discharge Planning, Psychosocial Support, Patient/Family Education   Barriers to Discharge MD  Medical stability  Nursing Decreased caregiver support, Home environment access/layout, Weight bearing restrictions 2 level 1/2 ba on main 2ste right rail/ramped w spouse  PT Home environment access/layout, Incontinence, Wound Care, Weight bearing restrictions, Weight pain, swelling  OT      SLP      SW Decreased caregiver support, Weight bearing restrictions     Team Discharge Planning: Destination: PT-Home ,OT- Home , SLP-  Projected Follow-up: PT-Home health PT, OT-  None, SLP-  Projected Equipment Needs: PT-To be determined, OT- To be determined, SLP-  Equipment Details: PT-pt has WC, needs RW, OT-  Patient/family involved in discharge planning: PT- Patient, Family member/caregiver,  OT-Patient, SLP-   MD ELOS: 10-12 Medical Rehab Prognosis:  Excellent Assessment: The patient has been admitted for CIR therapies with the diagnosis of R femur fx s/p IM nail . The team will be addressing functional mobility, strength, stamina, balance, safety, adaptive techniques and equipment, self-care, bowel and bladder mgt, patient and caregiver education. Goals have been set at mod I/Sup . Anticipated  discharge destination is home.   See Team Conference Notes for weekly updates to the plan of  care

## 2023-11-10 NOTE — Progress Notes (Signed)
 PROGRESS NOTE   Subjective/Complaints: Working with therapy in the gym. No new complaints. Reports PRN pain medications helping, works better at 10mg  dose.  No BM since 2/26.  ROS: Patient denies fever, chills, nausea, vomiting, diarrhea,  shortness of breath or chest pain, headache, or mood change. +leg pain and swelling + constipation   Objective:   VAS [place] LOWER EXTREMITY VENOUS (DVT) Result Date: 11/08/2023  Lower Venous DVT Study Patient Name:  [name] Brown  Date of Exam:   11/08/2023 Medical Rec #: [id number]     Accession #:    [id number] Date of Birth: 06-23-1951    Patient Gender: M Patient Age:   73 years Exam Location:  [facility] Procedure:      VAS [place] LOWER EXTREMITY VENOUS (DVT) Referring Phys: PAMELA LOVE --------------------------------------------------------------------------------  Indications: Swelling, and Post-op. Other Indications: Right femur fracture. Comparison Study: No prior exam. Performing Technologist: Fernande Bras  Examination Guidelines: A complete evaluation includes B-mode imaging, spectral Doppler, color Doppler, and power Doppler as needed of all accessible portions of each vessel. Bilateral testing is considered an integral part of a complete examination. Limited examinations for reoccurring indications may be performed as noted. The reflux portion of the exam is performed with the patient in reverse Trendelenburg.  +---------+---------------+---------+-----------+----------+--------------+ RIGHT    CompressibilityPhasicitySpontaneityPropertiesThrombus Aging +---------+---------------+---------+-----------+----------+--------------+ CFV      Full           Yes      Yes                                 +---------+---------------+---------+-----------+----------+--------------+ SFJ      Full           Yes      Yes                                  +---------+---------------+---------+-----------+----------+--------------+ FV Prox  Full                                                        +---------+---------------+---------+-----------+----------+--------------+ FV Mid   Full                                                        +---------+---------------+---------+-----------+----------+--------------+ FV DistalFull                                                        +---------+---------------+---------+-----------+----------+--------------+ PFV      Partial        Yes      Yes                                 +---------+---------------+---------+-----------+----------+--------------+  POP      Full           Yes      Yes                                 +---------+---------------+---------+-----------+----------+--------------+ PTV      Full                                                        +---------+---------------+---------+-----------+----------+--------------+ PERO     Full                                                        +---------+---------------+---------+-----------+----------+--------------+ A small segment of the right profunda vein is thrombosed.  +----+---------------+---------+-----------+----------+--------------+ LEFTCompressibilityPhasicitySpontaneityPropertiesThrombus Aging +----+---------------+---------+-----------+----------+--------------+ CFV Full           Yes      Yes                                 +----+---------------+---------+-----------+----------+--------------+ SFJ Full           Yes      Yes                                 +----+---------------+---------+-----------+----------+--------------+    Summary: RIGHT: - Findings consistent with acute deep vein thrombosis involving the right proximal profunda vein.  - No cystic structure found in the popliteal fossa.  LEFT: - No evidence of common femoral vein obstruction.   *See table(s) above for  measurements and observations. Electronically signed by Coral Else MD on 11/08/2023 at 8:39:34 PM.    Final    Recent Labs    11/08/23 0531 11/09/23 0541  WBC 6.5 5.9  HGB 10.1* 10.2*  HCT 29.1* 29.5*  PLT 212 247   Recent Labs    11/08/23 0531 11/09/23 0541  NA 133* 136  K 4.1 4.2  CL 102 99  CO2 26 26  GLUCOSE 111* 107*  BUN 20 21  CREATININE 1.08 1.00  CALCIUM 8.1* 8.7*    Intake/Output Summary (Last 24 hours) at 11/10/2023 1311 Last data filed at 11/10/2023 0825 Gross per 24 hour  Intake 640 ml  Output 950 ml  Net -310 ml        Physical Exam: Vital Signs Blood pressure 118/65, pulse 78, temperature 97.8 F (36.6 C), resp. rate 18, height 5\' 6"  (1.676 m), weight 87.4 kg, SpO2 93%.  Physical Exam:  General: NAD, working with therapy in gym HEENT: Head is normocephalic, atraumatic, PERRLA, EOMI, sclera anicteric, oral mucosa moist Neck: Supple without JVD or lymphadenopathy Heart: Reg rate and rhythm. + murmur Chest: CTA bilaterally without wheezes, rales, or rhonchi; no distress Abdomen: Soft, non-tender, Mildly distended, bowel sounds positive. Small umbilical hernia. Extremities: No clubbing, cyanosis, or edema. Pulses are 2+ Psych: Pt's affect is appropriate. Pt is cooperative Skin: Clean and intact without signs of breakdown Neuro:     Mental Status: He is alert and oriented to person, place,  and time.     Comments: Intact to light touch in all 4 extremities  Musculoskeletal:    Cervical back: Neck supple. No tenderness.     Comments: 2 + edema right thigh, knee and foot. Three surgical dressing in place on right hip/thigh- appear clean dry UE strength 5/5 in B/L Ue's  LLE- 5/5 in HF, KE, KF, DF and PF RLE HF 2-/5; KE/KF 2-/5; DF/PF 5/5  Exam Stable 2/28 Assessment/Plan: 1. Functional deficits which require 3+ hours per day of interdisciplinary therapy in a comprehensive inpatient rehab setting. Physiatrist is providing close team supervision and 24  hour management of active medical problems listed below. Physiatrist and rehab team continue to assess barriers to discharge/monitor patient progress toward functional and medical goals  Care Tool:  Bathing    Body parts bathed by patient: Right arm, Left arm, Chest, Abdomen, Front perineal area, Buttocks, Right upper leg, Left upper leg, Face   Body parts bathed by helper: Right lower leg, Left lower leg     Bathing assist Assist Level: Minimal Assistance - Patient > 75%     Upper Body Dressing/Undressing Upper body dressing   What is the patient wearing?: Pull over shirt    Upper body assist Assist Level: Set up assist    Lower Body Dressing/Undressing Lower body dressing      What is the patient wearing?: Underwear/pull up, Pants     Lower body assist Assist for lower body dressing: Maximal Assistance - Patient 25 - 49%     Toileting Toileting    Toileting assist Assist for toileting: Minimal Assistance - Patient > 75%     Transfers Chair/bed transfer  Transfers assist     Chair/bed transfer assist level: Minimal Assistance - Patient > 75%     Locomotion Ambulation   Ambulation assist      Assist level: Contact Guard/Touching assist Assistive device: Walker-rolling Max distance: 20   Walk 10 feet activity   Assist     Assist level: Contact Guard/Touching assist Assistive device: Walker-rolling   Walk 50 feet activity   Assist Walk 50 feet with 2 turns activity did not occur: Safety/medical concerns (pain/fatigue)         Walk 150 feet activity   Assist Walk 150 feet activity did not occur: Safety/medical concerns         Walk 10 feet on uneven surface  activity   Assist Walk 10 feet on uneven surfaces activity did not occur: Safety/medical concerns         Wheelchair     Assist Is the patient using a wheelchair?: Yes Type of Wheelchair: Manual    Wheelchair assist level: Supervision/Verbal cueing Max  wheelchair distance: 150    Wheelchair 50 feet with 2 turns activity    Assist        Assist Level: Supervision/Verbal cueing   Wheelchair 150 feet activity     Assist      Assist Level: Supervision/Verbal cueing   Blood pressure 118/65, pulse 78, temperature 97.8 F (36.6 C), resp. rate 18, height 5\' 6"  (1.676 m), weight 87.4 kg, SpO2 93%.  Medical Problem List and Plan: 1. Functional deficits secondary to R femur fx s/p IM nail- 25% PWB x 2 weeks, then 50 % for 2 weeks, then WBAT             -patient may  shower- cover incision             -ELOS/Goals: 7-10 days mod I             -  Continue CIR, evaluations today  -Team conference completed yesterday, expected discharge pending as he was pending evaluations  -2/27 bed level activity due to DVT  -2/28 DC bed level 2.  Antithrombotics: -DVT/anticoagulation:  Pharmaceutical: Lovenox added             -antiplatelet therapy: ASA daily-->increase to BID at discharge.  3. Pain Management:  Currently dilaudid 1 mg IV qid w/ oxycodone 5 mg qid. --increase oxycodone to 5-10 mg and premedicate prior to therapy- stop IV Dilaudid --ice prn. Flexeril prn for muscle spasms.  4. Mood/Behavior/Sleep: Monitor sleep hygiene. Continue delirium precautions             --Repuip at nights for RLS. Trazodone prn for sleep.             -antipsychotic agents: N/A  2/27 scheduled for neuropsych due to report this afternoon for therapy of decreased mood.  Consider addition of SSRI 5. Neuropsych/cognition: This patient is capable of making decisions on his own behalf. 6. Skin/Wound Care: Routine pressure relief measures.  7. Fluids/Electrolytes/Nutrition: Monitor I/O. Check CMET in am.  8. IM nail Right femur: 25% PWB thorough 03/07  --followed by 50% WB X 2 weeks then WBAT 9. HTN: Monitor BP TID--on Cozaar and Cardizem.  -2/28 BP controlled    11/10/2023    5:36 AM 11/09/2023    5:06 PM 11/09/2023    4:02 PM  Vitals with BMI  Systolic  118 132 116  Diastolic 65 68 62  Pulse 78 82 83     10. Non-obstructive CAD w/recovered EF: Continue Losartan, ASA and statin.  11.  Abnormal LFTs: Recheck labs in am  2/26 AST still elevated at 70, avoid hepatotoxic medication and monitor.  Change Tylenol to every 6 hours as needed from every 4 hours as needed.  -2/27 today patient indicated increased alcohol use to therapy that may be contributing to this  -Recheck Monday ordered 12. ABLA: Recheck CBC in am  -2/27 hemoglobin stable at 10.2  Recheck monday 13. Constipation: Will schedule Senna with supper. Doesn't want Sorbitol   -2/26 LMB late last night, continue to monitor   2/27 LBM yesterday, continue to monitor  2/28 Mg citrate ordered this afternoon 14. RLE edema- 2+ from R hip to R foot- monitor- might need dopplers  -2/27 DVT right proximal profunda, start Eliquis in bed level activity today  -2/28 Continue eliquis, monitor RLE swelling      LOS: 3 days A FACE TO FACE EVALUATION WAS PERFORMED  Fanny Dance 11/10/2023, 1:11 PM

## 2023-11-11 DIAGNOSIS — S72001S Fracture of unspecified part of neck of right femur, sequela: Secondary | ICD-10-CM | POA: Diagnosis not present

## 2023-11-11 MED ORDER — BISACODYL 5 MG PO TBEC
5.0000 mg | DELAYED_RELEASE_TABLET | Freq: Every day | ORAL | Status: DC | PRN
  Administered 2023-11-11: 5 mg via ORAL
  Filled 2023-11-11 (×3): qty 1

## 2023-11-11 NOTE — Plan of Care (Signed)
  Problem: Consults Goal: RH GENERAL PATIENT EDUCATION Description: See Patient Education module for education specifics. Outcome: Progressing   Problem: RH SAFETY Goal: RH STG ADHERE TO SAFETY PRECAUTIONS W/ASSISTANCE/DEVICE Description: STG Adhere to Safety Precautions With cues Assistance/Device. Outcome: Progressing   Problem: RH PAIN MANAGEMENT Goal: RH STG PAIN MANAGED AT OR BELOW PT'S PAIN GOAL Description: < 4 with prns Outcome: Progressing   Problem: RH KNOWLEDGE DEFICIT GENERAL Goal: RH STG INCREASE KNOWLEDGE OF SELF CARE AFTER HOSPITALIZATION Description: Patient and spouse will be able to manage care at discharge using educational resources for medications and dietary modification independently Outcome: Progressing

## 2023-11-11 NOTE — Progress Notes (Signed)
 PROGRESS NOTE   Subjective/Complaints: Complains of constipation, would like enema today and would like prn dulcolax oral available No other complaints  ROS +constipation   Objective:   No results found. Recent Labs    11/09/23 0541  WBC 5.9  HGB 10.2*  HCT 29.5*  PLT 247   Recent Labs    11/09/23 0541  NA 136  K 4.2  CL 99  CO2 26  GLUCOSE 107*  BUN 21  CREATININE 1.00  CALCIUM 8.7*    Intake/Output Summary (Last 24 hours) at 11/11/2023 1809 Last data filed at 11/11/2023 1453 Gross per 24 hour  Intake 558 ml  Output 1600 ml  Net -1042 ml        Physical Exam: Vital Signs Blood pressure (!) 111/59, pulse 78, temperature 97.8 F (36.6 C), temperature source Oral, resp. rate 18, height 5\' 6"  (1.676 m), weight 87.4 kg, SpO2 95%. Gen: no distress, normal appearing HEENT: oral mucosa pink and moist, NCAT Cardio: Reg rate Chest: normal effort, normal rate of breathing Abd: soft, non-distended Ext: no edema Psych: pleasant, normal affect Skin: Clean and intact without signs of breakdown Neuro:     Mental Status: He is alert and oriented to person, place, and time.     Comments: Intact to light touch in all 4 extremities  Musculoskeletal:    Cervical back: Neck supple. No tenderness.     Comments: 2 + edema right thigh, knee and foot. Three surgical dressing in place on right hip/thigh- appear clean dry UE strength 5/5 in B/L Ue's  LLE- 5/5 in HF, KE, KF, DF and PF RLE HF 2-/5; KE/KF 2-/5; DF/PF 5/5    Assessment/Plan: 1. Functional deficits which require 3+ hours per day of interdisciplinary therapy in a comprehensive inpatient rehab setting. Physiatrist is providing close team supervision and 24 hour management of active medical problems listed below. Physiatrist and rehab team continue to assess barriers to discharge/monitor patient progress toward functional and medical goals  Care  Tool:  Bathing    Body parts bathed by patient: Right arm, Left arm, Chest, Abdomen, Front perineal area, Buttocks, Right upper leg, Left upper leg, Face   Body parts bathed by helper: Right lower leg, Left lower leg     Bathing assist Assist Level: Minimal Assistance - Patient > 75%     Upper Body Dressing/Undressing Upper body dressing   What is the patient wearing?: Pull over shirt    Upper body assist Assist Level: Set up assist    Lower Body Dressing/Undressing Lower body dressing      What is the patient wearing?: Underwear/pull up, Pants     Lower body assist Assist for lower body dressing: Maximal Assistance - Patient 25 - 49%     Toileting Toileting    Toileting assist Assist for toileting: Minimal Assistance - Patient > 75%     Transfers Chair/bed transfer  Transfers assist     Chair/bed transfer assist level: Minimal Assistance - Patient > 75%     Locomotion Ambulation   Ambulation assist      Assist level: Contact Guard/Touching assist Assistive device: Walker-rolling Max distance: 20   Walk 10 feet activity  Assist     Assist level: Contact Guard/Touching assist Assistive device: Walker-rolling   Walk 50 feet activity   Assist Walk 50 feet with 2 turns activity did not occur: Safety/medical concerns (pain/fatigue)         Walk 150 feet activity   Assist Walk 150 feet activity did not occur: Safety/medical concerns         Walk 10 feet on uneven surface  activity   Assist Walk 10 feet on uneven surfaces activity did not occur: Safety/medical concerns         Wheelchair     Assist Is the patient using a wheelchair?: Yes Type of Wheelchair: Manual    Wheelchair assist level: Supervision/Verbal cueing Max wheelchair distance: 150    Wheelchair 50 feet with 2 turns activity    Assist        Assist Level: Supervision/Verbal cueing   Wheelchair 150 feet activity     Assist      Assist  Level: Supervision/Verbal cueing   Blood pressure (!) 111/59, pulse 78, temperature 97.8 F (36.6 C), temperature source Oral, resp. rate 18, height 5\' 6"  (1.676 m), weight 87.4 kg, SpO2 95%.  Medical Problem List and Plan: 1. Functional deficits secondary to R femur fx s/p IM nail- 25% PWB x 2 weeks, then 50 % for 2 weeks, then WBAT             -patient may  shower- cover incision             -ELOS/Goals: 7-10 days mod I             -Continue CIR, evaluations today             -Team conference completed yesterday, expected discharge pending as he was pending evaluations             -2/27 bed level activity due to DVT             -2/28 DC bed level 2.  Antithrombotics: -DVT/anticoagulation:  Pharmaceutical: Lovenox added             -antiplatelet therapy: ASA daily-->increase to BID at discharge.  3. Pain Management:  Currently dilaudid 1 mg IV qid w/ oxycodone 5 mg qid. --increase oxycodone to 5-10 mg and premedicate prior to therapy- stop IV Dilaudid --ice prn. Flexeril prn for muscle spasms.  4. Mood/Behavior/Sleep: Monitor sleep hygiene. Continue delirium precautions             --Repuip at nights for RLS. Trazodone prn for sleep.             -antipsychotic agents: N/A             2/27 scheduled for neuropsych due to report this afternoon for therapy of decreased mood.  Consider addition of SSRI 5. Neuropsych/cognition: This patient is capable of making decisions on his own behalf. 6. Skin/Wound Care: Routine pressure relief measures.  7. Fluids/Electrolytes/Nutrition: Monitor I/O. Check CMET in am.  8. IM nail Right femur: 25% PWB thorough 03/07  --followed by 50% WB X 2 weeks then WBAT  9. HTN: Monitor BP TID--continue cardizem     11/10/2023    5:36 AM 11/09/2023    5:06 PM 11/09/2023    4:02 PM  Vitals with BMI  Systolic 118 132 540  Diastolic 65 68 62  Pulse 78 82 83      10. Non-obstructive CAD w/recovered EF: continue Losartan, ASA and statin.   11.  Abnormal LFTs:  Recheck labs in am             2/26 AST still elevated at 70, avoid hepatotoxic medication and monitor.  Change Tylenol to every 6 hours as needed from every 4 hours as needed.             -2/27 today patient indicated increased alcohol use to therapy that may be contributing to this             -Recheck Monday ordered  12. ABLA: Recheck CBC in am             -2/27 hemoglobin stable at 10.2             Recheck Monday  13. Constipation: Will schedule Senna with supper. Doesn't want Sorbitol              -2/26 LMB late last night, continue to monitor              2/27 LBM yesterday, continue to monitor             2/28 Mg citrate ordered this afternoon  3/1: enema ordered  14. RLE edema- 2+ from R hip to R foot- monitor- might need dopplers             -2/27 DVT right proximal profunda, start Eliquis in bed level activity today             Continue eliquis, monitor RLE swelling        LOS: 4 days A FACE TO FACE EVALUATION WAS PERFORMED  Clint Bolder P Tadeo Besecker 11/11/2023, 6:09 PM

## 2023-11-12 DIAGNOSIS — S72001S Fracture of unspecified part of neck of right femur, sequela: Secondary | ICD-10-CM | POA: Diagnosis not present

## 2023-11-12 NOTE — Progress Notes (Signed)
 Physical Therapy Session Note  Patient Details  Name: [name] Brown MRN: [id number] Date of Birth: 07-Aug-1951  Today's Date: 11/12/2023 PT Individual Time: 0759-0900, [id number] PT Individual Time Calculation (min): 61 min, 42 min   Short Term Goals: Week 1:  PT Short Term Goal 1 (Week 1): Pt will perform sit to stand with RW and supervision PT Short Term Goal 2 (Week 1): Pt will perform stand pivot transfer with RW and supervision PT Short Term Goal 3 (Week 1): pt will ambulate 50 feet with RW and CGA  Skilled Therapeutic Interventions/Progress Updates:      Treatment Session 1  Pt seated in WC upon arrival brushing teeth at sink. Pt agreeable to therapy. Pt reports 4/10 R hip pain, pt requesting pain medidince. Notified nursing. Therpaist provided rest breaks and repositioning, and elevated R LE/donned ice at end of session.   Pt reports able to get OOB without assist for R LE today. Pt performed sit to stand with RW and close supervision, pt donned boxers and pants with min A to feed through R LE for boxers, and set up assist only for pants, pt stood and pulled over buttocks with supervision. Verbal cues provided for safety for donning shirt.   Pt ambulated 2x187 feet with RW and supervision, pt demos improved caryy over of TTWBing technique with step to gait, with intermittent cues for Wbing through toes only on R LE. pt reports pain free with walking, however fatigue in arms--mproved with seated rest break.  1x10 standing R LE toe taps onto 1.75 inch step, verbal cues provided for hip and knee flexionpt reports increased ache in R hip flexor, decreased with seated rest break  1x10 standing marcing R LE 1x10 standing R LE hip abduction 1x10 standing R LE hip extension   Sit to supine on mat table with supervision with use of L LE to lift R LE.   SLR x10 active assisted, with pt initiating and therapist assisiting pt through remainder motion as tolerated Supine hip abduction active  assisted with pt reducing friction.   Supine to sit with min A for R LE, with therpaist encouraging pt to activate muscles.   Pt performed ambualtory transfer to recliner with RW and supervision, intermittent verbal cues provided for kicking out R LE prior to standing/sitting.   Pt seated in WC with all needs within reach, R LE elevated, and with ice to R hip. Nurse in room at end of session administering medication.    Treatment Session 2   Pt supine in bed upon arrival. Pt agreeable to therapy. Pt reports R hip 5/10 pain, premedicated. Therapist provided rest breaks and repositioning throughout session for pain management.   Supine to sit with flat bed and without use of bedrails with supervision and increased time with guarding R LE, and use of hand to assist R LE off of bed.   Pt ambulated room to ortho gym, ortho gym to room with RW and CGA, with intermittent verbal cues for TDWBing only. Pt requesting RW to be lowered one notch. Pt ambulated ~50 feet with RW lowered, pt reports it feels easier with less strain on his arms, pt maintaining precautions.   3x10 toe taps on 3 inch step with therapist assisting with advancement on first trail for improved hip/knee flexion versus circumduction/ankle dorsiflexion compensation, pt demos improved active clearance on 2nd and 3rd trial.   3x10 active assisted heel slides,holding for 5" at end of pt tolerable range with R LE on blue  scooter-empty end feel; however pt tolerating slightly more ROM with each rep.   1x10 PROM knee flexion, holding for 5" at end of pt tolerable range with pt seated EOM with R LE hanging.   Pt overall very guarded of R hip, with hip hiking in seated position. Education provided on importance of proper positioning of hip. Pt performed lateral weight shifting while seated in chair, however pt continues to rest with hip hike.   Pt seated in WC at end of session with all needs within reach and bed alarm on.    Therapy  Documentation Precautions:  Precautions Precautions: Fall Restrictions Weight Bearing Restrictions Per Provider Order: Yes RLE Weight Bearing Per Provider Order: Partial weight bearing RLE Partial Weight Bearing Percentage or Pounds: 25  Therapy/Group: Individual Therapy  [facility] Ambrose Finland, [place], DPT  11/12/2023, 7:31 AM

## 2023-11-12 NOTE — Progress Notes (Signed)
 Occupational Therapy Session Note  Patient Details  Name: [name] Brown MRN: [id number] Date of Birth: [birth year]  Today's Date: 11/12/2023 OT Individual Time: 1005-1048 OT Individual Time Calculation (min): 43 min   Today's Date: 11/12/2023 OT Individual Time: 1450-1530 OT Individual Time Calculation (min): 40 min   Short Term Goals: Week 1:  OT Short Term Goal 1 (Week 1): Pt will perform toileting tasks with contact guard for balance OT Short Term Goal 2 (Week 1): Pt will thread LB clothing (underwear and pants) with superivsion with AE prn OT Short Term Goal 3 (Week 1): Pt will be given HEP for right Le to improve mobility for ADL performance  Skilled Therapeutic Interventions/Progress Updates:   Session 1: Pt greeted sitting in recliner for skilled OT session with focus on functional reaching tasks and transfers for carryover into ADL participation.   Pain: Pt reported 3/10 pain at rest, stating pain increased to "about a 5 or 6" with activity. OT offering intermediate rest breaks and positioning suggestions throughout session to address pain/fatigue and maximize participation/safety in session.   Functional Transfers: Sit<>stands and ambulatory transfers with supervision + RW.   Therapeutic Activities: Pt instructed in functional reaching task to target mechanics of LB garment threading and footwear management, as patient reached toward floor-level to place rings onto cones. Pt completes 3x10 reps of activity, reporting a compressive sensation in R-thigh (surgical site) due to edema.   Variation of activity then completed with use of bean bags and reacher. Pt able to retrieve 2x7 bean bags placed on floor at ~1-2 ft away. Then using reacher to thread BLE into looped theraband with supervision.   Therapeutic Exercise: Pt completes 1x10 reps of knee kicks to target movement required for threading.   Education: Pt and OT review buttons on hospital bed for patient awareness during  nightime positioning to allow for BLE elevation for edeman management.   Pt remained resting in bed with 4Ps assessed and immediate needs met. Pt continues to be appropriate for skilled OT intervention to promote further functional independence in ADLs/IADLs.   Session 2: Pt greeted sitting in [facility] for skilled OT session with focus on BADL participation.   Pain: Pt reported 6.5/10 pain. OT offering intermediate rest breaks and positioning suggestions throughout session to address pain/fatigue and maximize participation/safety in session.   Functional Transfers: Ambulatory walk-in shower transfer with supervision + RW.  Self Care Tasks: Pt completes the following self care tasks with levels of assistance noted below, UB: Bathing/dressing at setup/supervision level. LB: Bathing with Min A for RLE, would benefit from long-handled sponge, standing pericare with CGA + unilateral support on grad bar. Pt educated on use of reacher, completing unthreading of LB garments/hospital socks, re-threading clean briefs all with supervision.   Pt remained resting in bed with 4Ps assessed and immediate needs met. Pt continues to be appropriate for skilled OT intervention to promote further functional independence in ADLs/IADLs.   Therapy Documentation Precautions:  Precautions Precautions: Fall Restrictions Weight Bearing Restrictions Per Provider Order: Yes RLE Weight Bearing Per Provider Order: Partial weight bearing RLE Partial Weight Bearing Percentage or Pounds: 25  Therapy/Group: Individual Therapy  Lou Cal, OTR/L, MSOT  11/12/2023, 6:27 AM

## 2023-11-12 NOTE — Progress Notes (Signed)
 Occupational Therapy Session Note  Patient Details  Name: [name] Brown MRN: [id number] Date of Birth: May 18, 1951  {CHL IP REHAB OT TIME CALCULATIONS:304400400}  {CHL IP REHAB OT TIME CALCULATIONS:304400400}  Short Term Goals: Week 1:  OT Short Term Goal 1 (Week 1): Pt will perform toileting tasks with contact guard for balance OT Short Term Goal 2 (Week 1): Pt will thread LB clothing (underwear and pants) with superivsion with AE prn OT Short Term Goal 3 (Week 1): Pt will be given HEP for right Le to improve mobility for ADL performance  Skilled Therapeutic Interventions/Progress Updates:   Session 1: Pt greeted *** for skilled OT session with focus on ***.   Pain: Pt reported ***/10 pain, stating "***" in reference to ***. OT offering intermediate rest breaks and positioning suggestions throughout session to address pain/fatigue and maximize participation/safety in session.   Functional Transfers:  Self Care Tasks: Pt completes the following self care tasks with levels of assistance noted below, UB: LB:   Therapeutic Activities:  Therapeutic Exercise:   Education:  Pt remained *** with 4Ps assessed and immediate needs met. Pt continues to be appropriate for skilled OT intervention to promote further functional independence in ADLs/IADLs.   Session 2:  Pt greeted *** for skilled OT session with focus on ***.   Pain: Pt reported ***/10 pain, stating "***" in reference to ***. OT offering intermediate rest breaks and positioning suggestions throughout session to address pain/fatigue and maximize participation/safety in session.   Functional Transfers:  Self Care Tasks: Pt completes the following self care tasks with levels of assistance noted below, UB: LB:   Therapeutic Activities:  Therapeutic Exercise:   Education:  Pt remained *** with 4Ps assessed and immediate needs met. Pt continues to be appropriate for skilled OT intervention to promote further functional  independence in ADLs/IADLs.   Therapy Documentation Precautions:  Precautions Precautions: Fall Restrictions Weight Bearing Restrictions Per Provider Order: Yes RLE Weight Bearing Per Provider Order: Partial weight bearing RLE Partial Weight Bearing Percentage or Pounds: 25  Therapy/Group: Individual Therapy  Lou Cal, OTR/L, MSOT  11/12/2023, 4:04 PM

## 2023-11-12 NOTE — Progress Notes (Signed)
 PROGRESS NOTE   Subjective/Complaints: No new complaints this morning Feels much better today after having a BM yesterday Vitals stable  ROS +constipation- improved   Objective:   No results found. No results for input(s): "WBC", "HGB", "HCT", "PLT" in the last 72 hours.  No results for input(s): "NA", "K", "CL", "CO2", "GLUCOSE", "BUN", "CREATININE", "CALCIUM" in the last 72 hours.   Intake/Output Summary (Last 24 hours) at 11/12/2023 1245 Last data filed at 11/12/2023 1219 Gross per 24 hour  Intake 538 ml  Output 900 ml  Net -362 ml        Physical Exam: Vital Signs Blood pressure 110/67, pulse 86, temperature 97.8 F (36.6 C), temperature source Oral, resp. rate 16, height 5\' 6"  (1.676 m), weight 87.4 kg, SpO2 96%. Gen: no distress, normal appearing HEENT: oral mucosa pink and moist, NCAT Cardio: Reg rate Chest: normal effort, normal rate of breathing Abd: soft, non-distended Ext: no edema Psych: pleasant, normal affect Skin: Clean and intact without signs of breakdown Neuro:     Mental Status: He is alert and oriented to person, place, and time.     Comments: Intact to light touch in all 4 extremities  Musculoskeletal:    Cervical back: Neck supple. No tenderness.     Comments: 2 + edema right thigh, knee and foot. Three surgical dressing in place on right hip/thigh- appear clean dry UE strength 5/5 in B/L Ue's  LLE- 5/5 in HF, KE, KF, DF and PF RLE HF 2-/5; KE/KF 2-/5; DF/PF 5/5, stable 3/2   Assessment/Plan: 1. Functional deficits which require 3+ hours per day of interdisciplinary therapy in a comprehensive inpatient rehab setting. Physiatrist is providing close team supervision and 24 hour management of active medical problems listed below. Physiatrist and rehab team continue to assess barriers to discharge/monitor patient progress toward functional and medical goals  Care Tool:  Bathing    Body  parts bathed by patient: Right arm, Left arm, Chest, Abdomen, Front perineal area, Buttocks, Right upper leg, Left upper leg, Face   Body parts bathed by helper: Right lower leg, Left lower leg     Bathing assist Assist Level: Minimal Assistance - Patient > 75%     Upper Body Dressing/Undressing Upper body dressing   What is the patient wearing?: Pull over shirt    Upper body assist Assist Level: Set up assist    Lower Body Dressing/Undressing Lower body dressing      What is the patient wearing?: Underwear/pull up, Pants     Lower body assist Assist for lower body dressing: Maximal Assistance - Patient 25 - 49%     Toileting Toileting    Toileting assist Assist for toileting: Minimal Assistance - Patient > 75%     Transfers Chair/bed transfer  Transfers assist     Chair/bed transfer assist level: Minimal Assistance - Patient > 75%     Locomotion Ambulation   Ambulation assist      Assist level: Contact Guard/Touching assist Assistive device: Walker-rolling Max distance: 20   Walk 10 feet activity   Assist     Assist level: Contact Guard/Touching assist Assistive device: Walker-rolling   Walk 50 feet activity  Assist Walk 50 feet with 2 turns activity did not occur: Safety/medical concerns (pain/fatigue)         Walk 150 feet activity   Assist Walk 150 feet activity did not occur: Safety/medical concerns         Walk 10 feet on uneven surface  activity   Assist Walk 10 feet on uneven surfaces activity did not occur: Safety/medical concerns         Wheelchair     Assist Is the patient using a wheelchair?: Yes Type of Wheelchair: Manual    Wheelchair assist level: Supervision/Verbal cueing Max wheelchair distance: 150    Wheelchair 50 feet with 2 turns activity    Assist        Assist Level: Supervision/Verbal cueing   Wheelchair 150 feet activity     Assist      Assist Level: Supervision/Verbal  cueing   Blood pressure 110/67, pulse 86, temperature 97.8 F (36.6 C), temperature source Oral, resp. rate 16, height 5\' 6"  (1.676 m), weight 87.4 kg, SpO2 96%.  Medical Problem List and Plan: 1. Functional deficits secondary to R femur fx s/p IM nail- 25% PWB x 2 weeks, then 50 % for 2 weeks, then WBAT             -patient may  shower- cover incision             -ELOS/Goals: 7-10 days mod I             -continue CIR, evaluations today             -Team conference completed yesterday, expected discharge pending as he was pending evaluations             -2/27 bed level activity due to DVT             -2/28 DC bed level  2.  Antithrombotics: -DVT/anticoagulation:  Pharmaceutical: continue lovenox             -antiplatelet therapy: ASA daily-->increase to BID at discharge.   3. Pain Management:  Currently dilaudid 1 mg IV qid w/ oxycodone 5 mg qid. --continue oxycodone to 5-10 mg and premedicate prior to therapy- stop IV Dilaudid --ice prn. Flexeril prn for muscle spasms.   4. Mood/Behavior/Sleep: Monitor sleep hygiene. Continue delirium precautions             --continue Repuip at nights for RLS. Trazodone prn for sleep.             -antipsychotic agents: N/A             2/27 scheduled for neuropsych due to report this afternoon for therapy of decreased mood.  Consider addition of SSRI 5. Neuropsych/cognition: This patient is capable of making decisions on his own behalf. 6. Skin/Wound Care: Routine pressure relief measures.  7. Fluids/Electrolytes/Nutrition: Monitor I/O. Check CMET in am.  8. IM nail Right femur: 25% PWB thorough 03/07  --followed by 50% WB X 2 weeks then WBAT  9. HTN: Monitor BP TID--continue cardizem     11/10/2023    5:36 AM 11/09/2023    5:06 PM 11/09/2023    4:02 PM  Vitals with BMI  Systolic 118 132 956  Diastolic 65 68 62  Pulse 78 82 83      10. Non-obstructive CAD w/recovered EF: continue Losartan, ASA and statin.   11.  Abnormal LFTs: Recheck labs  in am             2/26  AST still elevated at 70, avoid hepatotoxic medication and monitor.  Change Tylenol to every 6 hours as needed from every 4 hours as needed.             -2/27 today patient indicated increased alcohol use to therapy that may be contributing to this             -Recheck Monday ordered  12. ABLA: Recheck CBC in am             -2/27 hemoglobin stable at 10.2             Recheck Monday  13. Constipation: Will schedule Senna with supper. Doesn't want Sorbitol              -2/26 LMB late last night, continue to monitor              2/27 LBM yesterday, continue to monitor             2/28 Mg citrate ordered this afternoon  3/1: enema ordered  3/2: feeling much better  14. RLE edema- 2+ from R hip to R foot- monitor- might need dopplers             -2/27 DVT right proximal profunda, start Eliquis in bed level activity today             Continue eliquis, monitor RLE swelling        LOS: 5 days A FACE TO FACE EVALUATION WAS PERFORMED  Clint Bolder P Brieanna Nau 11/12/2023, 12:45 PM

## 2023-11-13 DIAGNOSIS — F428 Other obsessive-compulsive disorder: Secondary | ICD-10-CM | POA: Diagnosis not present

## 2023-11-13 DIAGNOSIS — D62 Acute posthemorrhagic anemia: Secondary | ICD-10-CM | POA: Diagnosis not present

## 2023-11-13 DIAGNOSIS — I10 Essential (primary) hypertension: Secondary | ICD-10-CM | POA: Diagnosis not present

## 2023-11-13 DIAGNOSIS — F429 Obsessive-compulsive disorder, unspecified: Secondary | ICD-10-CM

## 2023-11-13 DIAGNOSIS — R7989 Other specified abnormal findings of blood chemistry: Secondary | ICD-10-CM | POA: Diagnosis not present

## 2023-11-13 DIAGNOSIS — S72001S Fracture of unspecified part of neck of right femur, sequela: Secondary | ICD-10-CM | POA: Diagnosis not present

## 2023-11-13 LAB — CBC
HCT: 29.3 % — ABNORMAL LOW (ref 39.0–52.0)
Hemoglobin: 10.1 g/dL — ABNORMAL LOW (ref 13.0–17.0)
MCH: 33.6 pg (ref 26.0–34.0)
MCHC: 34.5 g/dL (ref 30.0–36.0)
MCV: 97.3 fL (ref 80.0–100.0)
Platelets: 351 10*3/uL (ref 150–400)
RBC: 3.01 MIL/uL — ABNORMAL LOW (ref 4.22–5.81)
RDW: 13.6 % (ref 11.5–15.5)
WBC: 6.1 10*3/uL (ref 4.0–10.5)
nRBC: 0 % (ref 0.0–0.2)

## 2023-11-13 LAB — BASIC METABOLIC PANEL
Anion gap: 8 (ref 5–15)
BUN: 22 mg/dL (ref 8–23)
CO2: 25 mmol/L (ref 22–32)
Calcium: 8.8 mg/dL — ABNORMAL LOW (ref 8.9–10.3)
Chloride: 104 mmol/L (ref 98–111)
Creatinine, Ser: 1.05 mg/dL (ref 0.61–1.24)
GFR, Estimated: >60 mL/min (ref >60)
Glucose, Bld: 106 mg/dL — ABNORMAL HIGH (ref 70–99)
Potassium: 4.1 mmol/L (ref 3.5–5.1)
Sodium: 137 mmol/L (ref 135–145)

## 2023-11-13 LAB — HEPATIC FUNCTION PANEL
ALT: 35 U/L (ref 0–44)
AST: 34 U/L (ref 15–41)
Albumin: 2.6 g/dL — ABNORMAL LOW (ref 3.5–5.0)
Alkaline Phosphatase: 82 U/L (ref 38–126)
Bilirubin, Direct: 0.2 mg/dL (ref 0.0–0.2)
Indirect Bilirubin: 1 mg/dL — ABNORMAL HIGH (ref 0.3–0.9)
Total Bilirubin: 1.2 mg/dL (ref 0.0–1.2)
Total Protein: 5.6 g/dL — ABNORMAL LOW (ref 6.5–8.1)

## 2023-11-13 MED ORDER — ACETAMINOPHEN 325 MG PO TABS: 325.0000 mg | ORAL_TABLET | Freq: Four times a day (QID) | ORAL | Status: DC | PRN

## 2023-11-13 MED ORDER — PRAVASTATIN SODIUM 20 MG PO TABS: 20.0000 mg | ORAL_TABLET | Freq: Every day | ORAL | Status: DC

## 2023-11-13 MED ORDER — ASPIRIN EC 81 MG PO TBEC: 81.0000 mg | DELAYED_RELEASE_TABLET | Freq: Every day | ORAL | Status: AC

## 2023-11-13 NOTE — Consult Note (Signed)
 Neuropsychological Consultation Comprehensive Inpatient Rehab   Patient:   [name] Brown   DOB:   1950-12-16  MR Number:  [id number]  Location:  [facility] [address] CENTER B [address] [place] [address] Dept: [phone] Loc: [phone]           Date of Service:   11/2023  Start Time:   9 AM End Time:   10 AM  Provider/Observer:  Arley Phenix, Psy.D.       Clinical Neuropsychologist       Billing Code/Service: [phone]  Reason for Service:    [name] Brown is a 73 year old male referred for neuropsychological consultation during his ongoing admission to the comprehensive inpatient rehabilitation unit.  Patient has a past medical history including hypertension, hypothyroid, nonobstructive CAD, AV replacement, colon cancer with right colectomy.  Patient was admitted on 11/03/2023 after stepping off ramp with fall in the right hip and immediate onset of right pain.  Patient had a right femur fracture and underwent emergent surgery and status post operation weightbearing limitations.  After medical stabilization patient was admitted to CIR due to functional decline in mobility and capacity to complete ADLs.  During today's clinical visit, the patient was awake and alert and oriented x 4.  Patient displayed good cognition.  Patient noted lifelong history with obsessive-compulsive disorder and anxiety/intrusive thinking.  Patient reports that he feels like he likely has what previously was called Asperger's disorder, which is currently mild autistic spectrum disorder.  Patient has always been high functioning and very intelligent man and worked in his career as a Teacher, early years/pre.  Patient acknowledges long history of depression and anxiety type symptoms without any current interventions.  The patient had been recently prescribed Prozac by his PCP but had not started taking the medication before his fall.  Patient and I had a discussion  around possible medications including the one that had already been prescribed.  Patient noted willingness to start taking the medication but due to his recent injury he had not started.  Patient also expressed desire to have referral for outpatient therapy around his obsessive-compulsive disorder and coping issues that are now having a major impact on his motivation.  When he was working he had external factors that required him to stay active and engaged.  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  Patient is concerned that motivational factors will play a role once his structured physical therapy i has been completed.  We addressed issues of motivation etc.  I talked with PA on the unit about putting in an ambulatory referral for therapy through outpatient behavioral health with [facility] and patient plans to continue with his primary care provider and initiate Prozac post discharge.  HPI for the current admission:    HPI: [name] Brown is a 73 year old male with history of HTN, hypothyroid, non-obstructive CAD, AV replacement, colon CA s/p right colectomy who was admitted on 11/03/23 after stepping off ramp with fall onto right hip and immediate onset of right hip pain. He was found to have right femur cephalomedullary Fx and underwent IM nail on the same day by Dr. Hulda Brown. Post op to be 25% WB X 2 weeks followed by 50% WB X 2 weeks then WBAT. Post op reported penile numbness as well as thigh pain. .    Reactive leucocytosis and thrombocytopenia has resolved. ABLA being monitored. Started on ASA bid for DVT prophylaxis today.  PT/OT has been working with patient who requires CGA to min assist with mobility and ADLs. CIR recommended due to functional decline.   Medical History:   Past Medical History:  Diagnosis Date   Aortic stenosis    Ascending aortic aneurysm (HCC) 09/18/2018   Colon cancer (HCC)    Family history of adverse reaction  to anesthesia    pt states his mother had 2 episodes of hyponatremia following anesthesia    GERD (gastroesophageal reflux disease)    Headache    Heart murmur    Hyperlipidemia 10/25/2016   Hypertension    pt is currently not taking any medications; pt states is borderline   Melanoma (HCC)    OSA on CPAP 09/18/2018   PAF (paroxysmal atrial fibrillation) (HCC) 06/13/2017   Perforated sigmoid colon (HCC)    PVC's (premature ventricular contractions) 10/25/2016   Tinnitus    Wears glasses          Patient Active Problem List   Diagnosis Date Noted   Obsessive compulsive disorder 11/13/2023   Fracture, proximal femur, right, sequela 11/07/2023   Closed right hip fracture, initial encounter (HCC) 11/03/2023   Obesity (BMI 30-39.9) 11/02/2023   Dysthymia 11/03/2022   Abnormal findings on diagnostic imaging of other parts of digestive tract 11/04/2021   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   Noninfective gastroenteritis and colitis, unspecified 11/04/2021   History of colonic polyps 11/04/2021   Screening for intestinal cancer 11/04/2021   Slow transit constipation 11/04/2021   SBO (small bowel obstruction) (HCC) 01/04/2021   Alcohol use 01/04/2021   Macular edema 10/22/2020   Hypothyroid 09/24/2018   OSA on CPAP 09/18/2018   S/P AVR (aortic valve replacement) 06/08/2017   Non-ischemic cardiomyopathy (HCC)    Aortic stenosis with bicuspid valve 10/25/2016   PVC's (premature ventricular contractions) 10/25/2016   Hyperlipidemia 10/25/2016   Coronary artery disease involving native coronary artery of native heart without angina pectoris 09/23/2016   History of colon cancer, stage I 08/18/2016   Essential hypertension    Perforated sigmoid colon s/p colectomy/colostomy 04/26/2016 04/25/2016    Behavioral Observation/Mental Status:   [name] Brown  presents as a 73 y.o.-year-old Right handed Caucasian Male who appeared his stated age. his dress was Appropriate and  he was Well Groomed and his manners were Appropriate to the situation.  his participation was indicative of Appropriate and Attentive behaviors.  There were physical disabilities noted.  he displayed an appropriate level of cooperation and motivation.    Interactions:    Active Appropriate  Attention:   within normal limits and attention span and concentration were age appropriate  Memory:   within normal limits; recent and remote memory intact  Visuo-spatial:   within normal limits  Speech (Volume):  normal  Speech:   normal; normal  Thought Process:  Coherent and Relevant  Coherent, Linear, and Logical  Though Content:  WNL; not suicidal and not homicidal  Orientation:   person, place, time/date, and situation  Judgment:   Good  Planning:   Good  Affect:    Appropriate  Mood:    Anxious  Insight:   Good  Intelligence:   very high  Psychiatric History:  Patient has no prior formal diagnosis he describes symptoms consistent with mild spectrum autistic disorder, depression and anxiety symptoms.  Particularly the obsessive-compulsive thoughts and increasing symptoms of anhedonia and loss of drive.  Family Med/Psych History:  Family History  Problem Relation Age of Onset   Dementia Mother  Kidney disease Mother    Vascular Disease Mother    CAD Mother    Heart attack Father    Alcohol abuse Father    Cirrhosis Father    Stroke Maternal Grandmother    Stroke Paternal Grandmother     Impression/DX:   [name] Brown is a 73 year old male referred for neuropsychological consultation during his ongoing admission to the comprehensive inpatient rehabilitation unit.  Patient has a past medical history including hypertension, hypothyroid, nonobstructive CAD, AV replacement, colon cancer with right colectomy.  Patient was admitted on 11/03/2023 after stepping off ramp with fall in the right hip and immediate onset of right pain.  Patient had a right femur fracture and underwent  emergent surgery and status post operation weightbearing limitations.  After medical stabilization patient was admitted to CIR due to functional decline in mobility and capacity to complete ADLs.  During today's clinical visit, the patient was awake and alert and oriented x 4.  Patient displayed good cognition.  Patient noted lifelong history with obsessive-compulsive disorder and anxiety/intrusive thinking.  Patient reports that he feels like he likely has what previously was called Asperger's disorder, which is currently mild autistic spectrum disorder.  Patient has always been high functioning and very intelligent man and worked in his career as a Teacher, early years/pre.  Patient acknowledges long history of depression and anxiety type symptoms without any current interventions.  The patient had been recently prescribed Prozac by his PCP but had not started taking the medication before his fall.  Patient and I had a discussion around possible medications including the one that had already been prescribed.  Patient noted willingness to start taking the medication but due to his recent injury he had not started.  Patient also expressed desire to have referral for outpatient therapy around his obsessive-compulsive disorder and coping issues that are now having a major impact on his motivation.  When he was working he had external factors that required him to stay active and engaged.  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  Patient is concerned that motivational factors will play a role once his structured physical therapy i has been completed.  We addressed issues of motivation etc.  I talked with PA on the unit about putting in an ambulatory referral for therapy through outpatient behavioral health with [facility] and patient plans to continue with his primary care provider and initiate Prozac post discharge.  Disposition/Plan:  Today we worked on coping  and adjustment issues and talked about setting up referral for outpatient therapy through [facility] behavioral health.          Electronically Signed   _______________________ Arley Phenix, Psy.D. Clinical Neuropsychologist

## 2023-11-13 NOTE — Progress Notes (Signed)
 Occupational Therapy Session Note  Patient Details  Name: [name] Brown MRN: [id number] Date of Birth: 1951/09/10  {CHL IP REHAB OT TIME CALCULATIONS:304400400}   Short Term Goals: Week 1:  OT Short Term Goal 1 (Week 1): Pt will perform toileting tasks with contact guard for balance OT Short Term Goal 2 (Week 1): Pt will thread LB clothing (underwear and pants) with superivsion with AE prn OT Short Term Goal 3 (Week 1): Pt will be given HEP for right Le to improve mobility for ADL performance  Skilled Therapeutic Interventions/Progress Updates:    Patient agreeable to participate in OT session. Reports *** pain level.   Patient participated in skilled OT session focusing on ***. Therapist facilitated/assessed/developed/educated/integrated/elicited *** in order to improve/facilitate/promote    Therapy Documentation Precautions:  Precautions Precautions: Fall Restrictions Weight Bearing Restrictions Per Provider Order: Yes RLE Weight Bearing Per Provider Order: Partial weight bearing RLE Partial Weight Bearing Percentage or Pounds: 25   Therapy/Group: Individual Therapy  Limmie Patricia, OTR/L,CBIS  Supplemental OT - [facility] and [facility] Secure Chat Preferred   11/13/2023, 5:20 PM

## 2023-11-13 NOTE — Progress Notes (Signed)
 Patient ID: [name] Brown, male   DOB: July 04, 1951, 73 y.o.   MRN: [id number]  Have ordered rolling walker and 3 in 1 via [employer] for home. Wife has ordered and wheelchair online for him.

## 2023-11-13 NOTE — Progress Notes (Signed)
 PROGRESS NOTE   Subjective/Complaints: No new complaints or concerns this AM.  Continues to have pain with use of his R leg, pain meds helping keep it controlled.   ROS  ROS: Patient denies fever, new vision changes, dizziness, nausea, vomiting, diarrhea,  shortness of breath or chest pain, headache, or mood change.  +constipation- improved   Objective:   No results found. Recent Labs    11/13/23 0610  WBC 6.1  HGB 10.1*  HCT 29.3*  PLT 351    Recent Labs    11/13/23 0610  NA 137  K 4.1  CL 104  CO2 25  GLUCOSE 106*  BUN 22  CREATININE 1.05  CALCIUM 8.8*     Intake/Output Summary (Last 24 hours) at 11/13/2023 1234 Last data filed at 11/13/2023 0700 Gross per 24 hour  Intake 770 ml  Output 1500 ml  Net -730 ml        Physical Exam: Vital Signs Blood pressure 135/77, pulse 70, temperature 97.7 F (36.5 C), temperature source Oral, resp. rate 18, height 5\' 6"  (1.676 m), weight 87.4 kg, SpO2 98%. Gen: no distress, normal appearing, working with PT  HEENT: oral mucosa pink and moist, NCAT Cardio: RRR, + murmur Chest: CTAB, normal effort, normal rate of breathing Abd: soft, non-distended, +BS Ext: no edema Psych: pleasant, normal affect Skin: Clean and intact without signs of breakdown Neuro:     Mental Status: He is alert and oriented to person, place, and time.     Comments: Intact to light touch in all 4 extremities  Musculoskeletal:    Cervical back: Neck supple. No tenderness.     Comments: 2 + edema right thigh, knee and foot. Three surgical dressing in place on right hip/thigh- clean and dry Moving all 4 extremities sitting in chair  Prior Exam UE strength 5/5 in B/L Ue's  LLE- 5/5 in HF, KE, KF, DF and PF RLE HF 2-/5; KE/KF 2-/5; DF/PF 5/5, stable 3/2   Assessment/Plan: 1. Functional deficits which require 3+ hours per day of interdisciplinary therapy in a comprehensive inpatient rehab  setting. Physiatrist is providing close team supervision and 24 hour management of active medical problems listed below. Physiatrist and rehab team continue to assess barriers to discharge/monitor patient progress toward functional and medical goals  Care Tool:  Bathing    Body parts bathed by patient: Right arm, Left arm, Chest, Abdomen, Front perineal area, Buttocks, Right upper leg, Left upper leg, Face   Body parts bathed by helper: Right lower leg, Left lower leg     Bathing assist Assist Level: Minimal Assistance - Patient > 75%     Upper Body Dressing/Undressing Upper body dressing   What is the patient wearing?: Pull over shirt    Upper body assist Assist Level: Set up assist    Lower Body Dressing/Undressing Lower body dressing      What is the patient wearing?: Underwear/pull up, Pants     Lower body assist Assist for lower body dressing: Maximal Assistance - Patient 25 - 49%     Toileting Toileting    Toileting assist Assist for toileting: Minimal Assistance - Patient > 75%  Transfers Chair/bed transfer  Transfers assist     Chair/bed transfer assist level: Minimal Assistance - Patient > 75%     Locomotion Ambulation   Ambulation assist      Assist level: Contact Guard/Touching assist Assistive device: Walker-rolling Max distance: 20   Walk 10 feet activity   Assist     Assist level: Contact Guard/Touching assist Assistive device: Walker-rolling   Walk 50 feet activity   Assist Walk 50 feet with 2 turns activity did not occur: Safety/medical concerns (pain/fatigue)         Walk 150 feet activity   Assist Walk 150 feet activity did not occur: Safety/medical concerns         Walk 10 feet on uneven surface  activity   Assist Walk 10 feet on uneven surfaces activity did not occur: Safety/medical concerns         Wheelchair     Assist Is the patient using a wheelchair?: Yes Type of Wheelchair: Manual     Wheelchair assist level: Supervision/Verbal cueing Max wheelchair distance: 150    Wheelchair 50 feet with 2 turns activity    Assist        Assist Level: Supervision/Verbal cueing   Wheelchair 150 feet activity     Assist      Assist Level: Supervision/Verbal cueing   Blood pressure 135/77, pulse 70, temperature 97.7 F (36.5 C), temperature source Oral, resp. rate 18, height 5\' 6"  (1.676 m), weight 87.4 kg, SpO2 98%.  Medical Problem List and Plan: 1. Functional deficits secondary to R femur fx s/p IM nail- 25% PWB x 2 weeks, then 50 % for 2 weeks, then WBAT             -patient may  shower- cover incision             -ELOS/Goals: 7-10 days mod I             -continue CIR, evaluations today             -Team conference completed yesterday, expected discharge pending as he was pending evaluations             -2/27 bed level activity due to DVT             -2/28 DC bed level  -Possible DC end of this Week  2.  Antithrombotics: -DVT/anticoagulation:  Pharmaceutical: continue lovenox             -antiplatelet therapy: ASA daily-->increase to BID at discharge.   3. Pain Management:  Currently dilaudid 1 mg IV qid w/ oxycodone 5 mg qid. --continue oxycodone to 5-10 mg and premedicate prior to therapy- stop IV Dilaudid --ice prn. Flexeril prn for muscle spasms.   4. Mood/Behavior/Sleep: Monitor sleep hygiene. Continue delirium precautions             --continue Repuip at nights for RLS. Trazodone prn for sleep.             -antipsychotic agents: N/A             2/27 scheduled for neuropsych due to report this afternoon for therapy of decreased mood.  Consider addition of SSRI 5. Neuropsych/cognition: This patient is capable of making decisions on his own behalf. 6. Skin/Wound Care: Routine pressure relief measures.  7. Fluids/Electrolytes/Nutrition: Monitor I/O. Check CMET in am.  8. IM nail Right femur: 25% PWB thorough 03/07  --followed by 50% WB X 2 weeks then  WBAT  9. HTN: Monitor  BP TID--continue cardizem  3/3 BP stable     11/13/2023    5:30 AM 11/12/2023    7:33 PM 11/12/2023   12:00 PM  Vitals with BMI  Systolic 135 111 191  Diastolic 77 65 67  Pulse 70 91 86    10. Non-obstructive CAD w/recovered EF: continue Losartan, ASA and statin.   -Denies Chest pain 11.  Abnormal LFTs: Recheck labs in am             2/26 AST still elevated at 70, avoid hepatotoxic medication and monitor.  Change Tylenol to every 6 hours as needed from every 4 hours as needed.             -2/27 today patient indicated increased alcohol use to therapy that may be contributing to this             -3/3 lfts improved today  12. ABLA: Recheck CBC in am             -2/27 hemoglobin stable at 10.2             3/3 HGB stable  13. Constipation: Will schedule Senna with supper. Doesn't want Sorbitol              -2/26 LMB late last night, continue to monitor              2/27 LBM yesterday, continue to monitor             2/28 Mg citrate ordered this afternoon  3/1: enema ordered  3/3 LBM today, monitor   14. RLE edema- 2+ from R hip to R foot- monitor- might need dopplers             -2/27 DVT right proximal profunda, start Eliquis in bed level activity today             Continue eliquis, monitor RLE swelling- a little improved        LOS: 6 days A FACE TO FACE EVALUATION WAS PERFORMED  Fanny Dance 11/13/2023, 12:34 PM

## 2023-11-13 NOTE — Progress Notes (Signed)
 Physical Therapy Session Note  Patient Details  Name: [name] Brown MRN: [id number] Date of Birth: Jan 15, 1951  Today's Date: 11/13/2023 PT Individual Time: 1005-1100, 1300-1330 PT Individual Time Calculation (min): 55 min, 30 min   Short Term Goals: Week 1:  PT Short Term Goal 1 (Week 1): Pt will perform sit to stand with RW and supervision PT Short Term Goal 2 (Week 1): Pt will perform stand pivot transfer with RW and supervision PT Short Term Goal 3 (Week 1): pt will ambulate 50 feet with RW and CGA  Skilled Therapeutic Interventions/Progress Updates:      Treatment Session 1   Pt finishing session with neuropsych upon arrival. Pt agreeable to therapy. Pt reports 7/10 pain in R hip, nurse present to administer pain medication, therapist provided rest breaks and repositioning throughout session.    DME: Pt with questions regarding DME, pt reports he purchased standard WC from [place] however will return if insurance will cover--pt also expressing concerns about pt wife being able to lift WC into car. Therapist initially under impression (from eval and previous DME discussions) that pt had a WC at home (prior to admission) however this is not the case. Education provided that he will not qualify for WC under insurance 2/2 ambulation distance. Education provided on alternative option: transport chair to utilize in community, however pt opting to stick with standard WC for independence with WC mobility. Scheduled Family training tomorrow 2/3 at 10-12 to practice getting into pt personal car. Recommended pt wife also bring pt WC from home to ensure proper fit. Pt verbalized understanding and agreeable. Planning for D/C by end of this week (likely Friday-will discuss further in team conference however pt and wife agreeable).   Pt performed the following therex for improved seated posture, R LE strength/ROM   2x10 holding for 5 seconds -active assisted seated heel slides  with R LE on towel-and  therpaist assisting R LE into knee flexion   2x10 holding for 5 seconds - active assisted heel slides with pt L LE assisting R LE into knee flexion  1x10 lateral weight shift to R LE while seated to R LE for improved mechanics and seated posture  1x10 cross body reaching to R with L UE, to facilitate improved weight shift to R LE (while seated)   1x10 standing R LE knee flexion  Pt seated in recliner at end of session with all needs within reach and B LE elevated.    Treatment Session 2   Pt seated in WC upon arrival. Pt agreeable to therapy. Pt denies any pain.   Pt wife present for unofficial family training. Pt wife reports anxiety regarding discharge. Pt wife asking if they will get hospital bed. Education provided pt will not qualify for hospital bed 2/2 independence with bed mobility on flat bed with no bedrails. Pt wife asking questions regarding follow up therapy, and progression of weight bearing precautions. Education provided pt will have follow up HHPT, and answered pt specific questions related to scheduling eval and follow up visits. Discussed pt current R LE Wbing restrictions are 25% TDWBing, until 3/7. Recommended pt maintain these precautions until told otherwise by follow up HHPT. Pt and pt wife verbalized understanding.   Began family training with pt wife. Pt performed sit to stand with supervision, verbal cues provided for UE positioning intially for safety with distractions.   Pt ambulated room to ortho gym with RW and supervision, pt demos good carry over of Wbing precautions. Pt performed ambulatory  transfer to rehab apartment queen size bed with supervision, verbla cues provided for technique, with emphasis on posterior scoot, pt utilized L LE to lift R LE onto bed.   Pt wife overall very pleased with pt CLOF. Pt has additional questions regarding toileting/showering. PT deferred these questions to OT.   Pt seated in WC at end of session with all needs within reach.    Therapy Documentation Precautions:  Precautions Precautions: Fall Restrictions Weight Bearing Restrictions Per Provider Order: Yes RLE Weight Bearing Per Provider Order: Partial weight bearing RLE Partial Weight Bearing Percentage or Pounds: 25  Therapy/Group: Individual Therapy  [facility] [address], [place], DPT  11/13/2023, 7:42 AM

## 2023-11-14 DIAGNOSIS — R7989 Other specified abnormal findings of blood chemistry: Secondary | ICD-10-CM | POA: Diagnosis not present

## 2023-11-14 DIAGNOSIS — K5900 Constipation, unspecified: Secondary | ICD-10-CM | POA: Diagnosis not present

## 2023-11-14 DIAGNOSIS — F32A Depression, unspecified: Secondary | ICD-10-CM

## 2023-11-14 DIAGNOSIS — S72001S Fracture of unspecified part of neck of right femur, sequela: Secondary | ICD-10-CM | POA: Diagnosis not present

## 2023-11-14 DIAGNOSIS — I10 Essential (primary) hypertension: Secondary | ICD-10-CM | POA: Diagnosis not present

## 2023-11-14 MED ORDER — CITALOPRAM HYDROBROMIDE 10 MG PO TABS
5.0000 mg | ORAL_TABLET | Freq: Every day | ORAL | Status: DC
  Administered 2023-11-15 – 2023-11-16 (×2): 5 mg via ORAL
  Filled 2023-11-14 (×3): qty 1

## 2023-11-14 NOTE — Progress Notes (Signed)
 Physical Therapy Session Note  Patient Details  Name: [name] Brown MRN: [id number] Date of Birth: August 26, 1951  Today's Date: 11/14/2023 PT Individual Time: 5366-4403, 4742-5956 PT Individual Time Calculation (min): 38 min, 61 min   Short Term Goals: Week 1:  PT Short Term Goal 1 (Week 1): Pt will perform sit to stand with RW and supervision PT Short Term Goal 2 (Week 1): Pt will perform stand pivot transfer with RW and supervision PT Short Term Goal 3 (Week 1): pt will ambulate 50 feet with RW and CGA  Skilled Therapeutic Interventions/Progress Updates:      Treatment Session 1  Pt seated in WC upon arrival. Pt agreeable to therapy. Pt reports 7/10 pain, notified nursing, therapist provided rest breaks and repositioning.  Pt performed stand step transfer WC to nu step, and WC to kinetron throughout session with RW and CGA/supervision. Pt required assist for lifting R LE onto each for proper positioning. Education provided for technique for allowing the arms and L LE to do the work and to allow the R LE to be along for the ride for improved ROM/tolerance.   Pt performed nustep for 12 min for total of 197 steps at workload of 1 with pt completing within pt available range as tolerated. Therapist adjusted distance of seat from foot pedals 2 notches closer after 6 min for improved ROM. Pt very appreciative of nu step and reports it has been the least painful method used thus far to improve hip/knee flexion ROM.  Pt demos active knee flexion to 90 deg after completion of activity.   Pt performed ~10 min on Kinetron at lowest resistance to promote improved hip flexion ROM. Therapist assisted with hip extension to reduce amount of weight pt pushing through R LE.   Pt seated in WC at end of session with all needs within reach.   Treatment Session 2   Pt seated in WC upon arrival. Pt agreeable to therapy. Pt reports R LE pain, not quantified, notified nursing, Therapist provided rest breaks and  repositioning and elevated B LE in recliner at end of session.   Pt transported dependent in [facility] outside to practice transfer to pt personal car.   Pt performed stand pivot transfer WC to car with set up assist/supervisoin with pt car seat pushed back to further position, and car seat reclined  to accommodate for R LE. Pt required increased time, however able to use L LLE and B UE to assist R leg into car. Pt overall fatigued afterwards however both pt and wife feel comfortable with transfer and deny any questions/concerns.   Pt wife brought pt WC from home (recently ordered from [place]). Pt perform transfer to ensure proper fit, and therapist adjusted standard leg rests to pt appropriate fit. Education provided for purchasing R LE elevating leg rests for edema and pain management. Pt purchased through [place] during session.   Pt self propelled WC up and down ramp while outside with mod I.   Pt transferred WC to recliner with supervision with set up assist. Pt performed the following exercises to improve R LE strength/ROM/edema.   1x10 glute sets holding for 5 seconds  1x10 quad sets holding for 5 seconds   1x10 ankle pumps holding for 5 seconds  1x10 active assited SLR with use of gait belt  1x10 active assisted long sitting hip abduction with use of gait belt   Pt seated in recliner with all needs within reach and wife in room at end of session.  Therapy Documentation Precautions:  Precautions Precautions: Fall Restrictions Weight Bearing Restrictions Per Provider Order: Yes RLE Weight Bearing Per Provider Order: Partial weight bearing RLE Partial Weight Bearing Percentage or Pounds: 25  Therapy/Group: Individual Therapy  [facility] [place], [place], DPT  11/14/2023, 8:14 AM

## 2023-11-14 NOTE — Progress Notes (Signed)
 PROGRESS NOTE   Subjective/Complaints: Patient had neuropsychology visit yesterday. Mood has been decreased, in past he says his PCP has ordered Prozac, but he has not started yet.   ROS  ROS: Patient denies fever, new vision changes, dizziness, nausea, vomiting, diarrhea,  shortness of breath or chest pain, headache, or mood change.  +constipation- improved   Objective:   No results found. Recent Labs    11/13/23 0610  WBC 6.1  HGB 10.1*  HCT 29.3*  PLT 351    Recent Labs    11/13/23 0610  NA 137  K 4.1  CL 104  CO2 25  GLUCOSE 106*  BUN 22  CREATININE 1.05  CALCIUM 8.8*     Intake/Output Summary (Last 24 hours) at 11/14/2023 1113 Last data filed at 11/14/2023 0744 Gross per 24 hour  Intake 580 ml  Output 1725 ml  Net -1145 ml        Physical Exam: Vital Signs Blood pressure 116/62, pulse 72, temperature 97.9 F (36.6 C), temperature source Oral, resp. rate 17, height 5\' 6"  (1.676 m), weight 87.4 kg, SpO2 95%. Gen: no distress, normal appearing, laying in bed HEENT: oral mucosa pink and moist, NCAT Cardio: RRR, + murmur Chest: CTAB, normal effort, normal rate of breathing, on room air Abd: soft, non-distended, +BS- normoactive  Ext: RLE edema edema Psych: pleasant, normal affect Skin: Clean and intact without signs of breakdown Neuro:     Mental Status: He is alert and oriented to person, place, and time.     Comments: Intact to light touch in all 4 extremities  Musculoskeletal:    Cervical back: Neck supple. No tenderness.     Comments: 2 + edema right thigh, knee and foot. Three surgical dressing in place on right hip/thigh- clean and dry Moving all 4 extremities sitting in chair  Prior Exam UE strength 5/5 in B/L Ue's  LLE- 5/5 in HF, KE, KF, DF and PF RLE HF 2-/5; KE/KF 2-/5; DF/PF 5/5, stable 3/2   Assessment/Plan: 1. Functional deficits which require 3+ hours per day of  interdisciplinary therapy in a comprehensive inpatient rehab setting. Physiatrist is providing close team supervision and 24 hour management of active medical problems listed below. Physiatrist and rehab team continue to assess barriers to discharge/monitor patient progress toward functional and medical goals  Care Tool:  Bathing    Body parts bathed by patient: Right arm, Left arm, Chest, Abdomen, Front perineal area, Buttocks, Right upper leg, Left upper leg, Face   Body parts bathed by helper: Right lower leg, Left lower leg     Bathing assist Assist Level: Minimal Assistance - Patient > 75%     Upper Body Dressing/Undressing Upper body dressing   What is the patient wearing?: Pull over shirt    Upper body assist Assist Level: Set up assist    Lower Body Dressing/Undressing Lower body dressing      What is the patient wearing?: Underwear/pull up, Pants     Lower body assist Assist for lower body dressing: Maximal Assistance - Patient 25 - 49%     Toileting Toileting    Toileting assist Assist for toileting: Minimal Assistance - Patient >  75%     Transfers Chair/bed transfer  Transfers assist     Chair/bed transfer assist level: Minimal Assistance - Patient > 75%     Locomotion Ambulation   Ambulation assist      Assist level: Contact Guard/Touching assist Assistive device: Walker-rolling Max distance: 20   Walk 10 feet activity   Assist     Assist level: Contact Guard/Touching assist Assistive device: Walker-rolling   Walk 50 feet activity   Assist Walk 50 feet with 2 turns activity did not occur: Safety/medical concerns (pain/fatigue)         Walk 150 feet activity   Assist Walk 150 feet activity did not occur: Safety/medical concerns         Walk 10 feet on uneven surface  activity   Assist Walk 10 feet on uneven surfaces activity did not occur: Safety/medical concerns         Wheelchair     Assist Is the  patient using a wheelchair?: Yes Type of Wheelchair: Manual    Wheelchair assist level: Supervision/Verbal cueing Max wheelchair distance: 150    Wheelchair 50 feet with 2 turns activity    Assist        Assist Level: Supervision/Verbal cueing   Wheelchair 150 feet activity     Assist      Assist Level: Supervision/Verbal cueing   Blood pressure 116/62, pulse 72, temperature 97.9 F (36.6 C), temperature source Oral, resp. rate 17, height 5\' 6"  (1.676 m), weight 87.4 kg, SpO2 95%.  Medical Problem List and Plan: 1. Functional deficits secondary to R femur fx s/p IM nail- 25% PWB x 2 weeks, then 50 % for 2 weeks, then WBAT             -patient may  shower- cover incision             -ELOS/Goals: 7-10 days mod I             -continue CIR, evaluations today             -Team conference completed yesterday, expected discharge pending as he was pending evaluations             -2/27 bed level activity due to DVT             -2/28 DC bed level  -Possible DC end of this Week/Friday  -Team conference tomorrow  2.  Antithrombotics: -DVT/anticoagulation:  Pharmaceutical: continue lovenox             -antiplatelet therapy: ASA daily-->increase to BID at discharge.   3. Pain Management:  Currently dilaudid 1 mg IV qid w/ oxycodone 5 mg qid. --continue oxycodone to 5-10 mg and premedicate prior to therapy- stop IV Dilaudid --ice prn. Flexeril prn for muscle spasms.   4. Mood/Behavior/Sleep: Monitor sleep hygiene. Continue delirium precautions             --continue Repuip at nights for RLS. Trazodone prn for sleep.             -antipsychotic agents: N/A             2/27 scheduled for neuropsych due to report this afternoon for therapy of decreased mood.  Consider addition of SSRI  3/4 seen by neuropsychology, plan for outpatient therapy, behavioral health  3/4 Celexa was started 5. Neuropsych/cognition: This patient is capable of making decisions on his own behalf. 6.  Skin/Wound Care: Routine pressure relief measures.  7. Fluids/Electrolytes/Nutrition: Monitor I/O. Check CMET in  am.  8. IM nail Right femur: 25% PWB thorough 03/07  --followed by 50% WB X 2 weeks then WBAT  9. HTN: Monitor BP TID--continue cardizem  3/4 BP controlled, continue current treatment    11/14/2023    5:07 AM 11/13/2023    3:21 PM 11/13/2023    5:30 AM  Vitals with BMI  Systolic 116 134 161  Diastolic 62 63 77  Pulse 72 79 70    10. Non-obstructive CAD w/recovered EF: continue Losartan, ASA and statin.   -Denies Chest pain 11.  Abnormal LFTs: Recheck labs in am             2/26 AST still elevated at 70, avoid hepatotoxic medication and monitor.  Change Tylenol to every 6 hours as needed from every 4 hours as needed.             -2/27 today patient indicated increased alcohol use to therapy that may be contributing to this             -3/3 lfts improved today  12. ABLA: Recheck CBC in am             -2/27 hemoglobin stable at 10.2             3/3 HGB stable  13. Constipation: Will schedule Senna with supper. Doesn't want Sorbitol              -2/26 LMB late last night, continue to monitor              2/27 LBM yesterday, continue to monitor             2/28 Mg citrate ordered this afternoon  3/1: enema ordered  3/4 LBM today, improved- reviewed with patient  14. RLE edema- 2+ from R hip to R foot- monitor- might need dopplers             -2/27 DVT right proximal profunda, start Eliquis in bed level activity today             Continue eliquis, monitor RLE swelling- a little improved        LOS: 7 days A FACE TO FACE EVALUATION WAS PERFORMED  Fanny Dance 11/14/2023, 11:13 AM

## 2023-11-14 NOTE — Progress Notes (Signed)
 Has 2+ edema RLE--right thigh ace wrapped and elevated on 2 pillows. Orders written to start compression from mid thigh to mid foot daily for edema control. Also discussed starting antidepressant. He was agreeable to start Celexa 5mg  in am (work faster than prozac) and titrate up as indicated.

## 2023-11-14 NOTE — Progress Notes (Signed)
 Physical Therapy Session Note  Patient Details  Name: [name] Brown MRN: [id number] Date of Birth: 1951-08-28  Today's Date: 11/14/2023 PT Individual Time: 4098-1191 PT Individual Time Calculation (min): 31 min   Short Term Goals: Week 1:  PT Short Term Goal 1 (Week 1): Pt will perform sit to stand with RW and supervision PT Short Term Goal 2 (Week 1): Pt will perform stand pivot transfer with RW and supervision PT Short Term Goal 3 (Week 1): pt will ambulate 50 feet with RW and CGA  Skilled Therapeutic Interventions/Progress Updates: Pt presents sitting in w/c and agreeable to therapy.  Pt transfers from w/c and arm chair w/ supervision during session.  Pt encouraged for placement of R foot before sitting to improve R knee flexion, pt tends to slide it out into extension.  Pt negotiated cone obstacle course w/ distant supervision, cues for turns but maintains TDWB to R.  Pt returned to room and into BR, continent of bowel and bladder, charted in Flowsheets, supervision/mod I for pericare.  Pt returned to bed and transferred sit to supine w/ supervision, hooking R foot under L.  Bed alarm on and all needs in reach.     Therapy Documentation Precautions:  Precautions Precautions: Fall Restrictions Weight Bearing Restrictions Per Provider Order: Yes RLE Weight Bearing Per Provider Order: Partial weight bearing RLE Partial Weight Bearing Percentage or Pounds: 25 General:   Vital Signs: Therapy Vitals Temp: 98.1 F (36.7 C) Temp Source: Oral Pulse Rate: 85 Resp: 17 BP: 126/71 Patient Position (if appropriate): Sitting Oxygen Therapy SpO2: 95 % O2 Device: Room Air Pain:7/10 Pain Assessment Pain Scale: 0-10 Pain Score: 7  Pain Type: Acute pain Pain Location: Leg Pain Orientation: Right Pain Descriptors / Indicators: Spasm Pain Frequency: Constant Pain Onset: On-going Pain Intervention(s): Medication (See eMAR)     Therapy/Group: Individual Therapy  Lucio Edward 11/14/2023, 3:26 PM

## 2023-11-14 NOTE — Progress Notes (Signed)
 Physical Therapy Session Note  Patient Details  Name: [name] Brown MRN: [id number] Date of Birth: 1951/04/19  Today's Date: 11/14/2023 PT Individual Time: 0901-0928 PT Individual Time Calculation (min): 27 min   Short Term Goals: Week 1:  PT Short Term Goal 1 (Week 1): Pt will perform sit to stand with RW and supervision PT Short Term Goal 2 (Week 1): Pt will perform stand pivot transfer with RW and supervision PT Short Term Goal 3 (Week 1): pt will ambulate 50 feet with RW and CGA  Skilled Therapeutic Interventions/Progress Updates:    Pt presents in room in [facility], agreeable to PT. Pt reporting 6/10 pain, RN notified and present during session to administer medications. Session focused on gait training and therapeutic activities for managing household tasks with discussion and problem solving solutions for managing RLE weightbearing precautions. Pt maintains weightbearing precautions throughout session without cues as noted to BUE reliance on RW.  Pt completes transfer to RW, ambulates ~150' from room to ADL room with distant supervision, therapist pulling RW in tow to allow for seated rest break after gait. Once in ADL room, pt manages filling coffee pot and problem solving how to manage moving coffee pot as well as walking with pt placing coffee pot down and variable increments. Pt also manages opening/closing dishwasher, loading/unloading dishes and placing in overhead shelf on top shelf all while maintaining good postural stability and RLE weightbearing precautions. Pt self propels WC back to room modI even with busy hallway and unexpected stops from others ambulating in hallway.  Pt returns to room and remains seated in [facility] with all needs within reach, cal light in place at end of session. RN present to administer medications.   Therapy Documentation Precautions:  Precautions Precautions: Fall Restrictions Weight Bearing Restrictions Per Provider Order: Yes RLE Weight Bearing Per  Provider Order: Partial weight bearing RLE Partial Weight Bearing Percentage or Pounds: 25   Therapy/Group: Individual Therapy  Edwin Cap PT, DPT 11/14/2023, 9:29 AM

## 2023-11-14 NOTE — Progress Notes (Signed)
 Occupational Therapy Session Note  Patient Details  Name: [name] Brown MRN: [id number] Date of Birth: 1951-08-23  Today's Date: 11/14/2023 OT Individual Time: 1100-1131 OT Individual Time Calculation (min): 31 min   Today's Date: 11/14/2023 OT Individual Time: 1405-1430 OT Individual Time Calculation (min): 25 min   Short Term Goals: Week 1:  OT Short Term Goal 1 (Week 1): Pt will perform toileting tasks with contact guard for balance OT Short Term Goal 2 (Week 1): Pt will thread LB clothing (underwear and pants) with superivsion with AE prn OT Short Term Goal 3 (Week 1): Pt will be given HEP for right Le to improve mobility for ADL performance  Skilled Therapeutic Interventions/Progress Updates:   Session 1: Pt greeted sitting in recliner for skilled OT session with focus on BADL participation with emphasis placed on caregiver education.   Pain: Pt with un-rated pain at surgical site, OT offering intermediate rest breaks and positioning suggestions throughout session to address pain/fatigue and maximize participation/safety in session.   Functional Transfers: Sit<>stands and ambulatory walk-in shower with close supervision + RW.   Self Care Tasks: Pt completes the following self care tasks with levels of assistance noted below, UB: Setup/supervision progressing to Mod I.  LB: Setup/supervision progressing to Mod I with use of AE, including reacher and long-handled sponge. Standing peri-hygiene with CGA + intermediate use of grab bar for standing balance. Pt dependent for B TEDs.     Education: Spouse present and observant throughout session, all questions answered at end of session.  Pt remained sitting in WC with 4Ps assessed and immediate needs met. Pt continues to be appropriate for skilled OT intervention to promote further functional independence in ADLs/IADLs.   Session 2: Pt greeted sitting in [facility] for skilled OT session with focus on BUE strengthening for carryover into  functional transfers and ADL participation.   Pain: Pt reported 6-7/10 surgical pain, OT offering intermediate rest breaks and positioning suggestions throughout session to address pain/fatigue and maximize participation/safety in session.   Functional Transfers: Pt propels self from room<>main therapy gym with Mod I.   Therapeutic Exercise: Pt instructed in series of UE strengthening exercises:  -Overhead Ab/adduction -Horizontal Ab/adduction -Shoulder Flexion/Extension -Diagonal Ab/adduction  -Punches  Pt completes 1 set/10 reps of each exercise with red theraband, requiring multi-modal cuing for correct form. HEP assigned.    Pt remained sitting in WC with 4Ps assessed and immediate needs met. Pt continues to be appropriate for skilled OT intervention to promote further functional independence in ADLs/IADLs.   Therapy Documentation Precautions:  Precautions Precautions: Fall Restrictions Weight Bearing Restrictions Per Provider Order: Yes RLE Weight Bearing Per Provider Order: Partial weight bearing RLE Partial Weight Bearing Percentage or Pounds: 25   Therapy/Group: Individual Therapy  Lou Cal, OTR/L, MSOT  11/14/2023, 6:20 AM

## 2023-11-15 DIAGNOSIS — K5900 Constipation, unspecified: Secondary | ICD-10-CM | POA: Diagnosis not present

## 2023-11-15 DIAGNOSIS — S72001S Fracture of unspecified part of neck of right femur, sequela: Secondary | ICD-10-CM | POA: Diagnosis not present

## 2023-11-15 DIAGNOSIS — I10 Essential (primary) hypertension: Secondary | ICD-10-CM | POA: Diagnosis not present

## 2023-11-15 DIAGNOSIS — I824Y1 Acute embolism and thrombosis of unspecified deep veins of right proximal lower extremity: Secondary | ICD-10-CM | POA: Diagnosis not present

## 2023-11-15 MED ORDER — ACETAMINOPHEN 325 MG PO TABS
325.0000 mg | ORAL_TABLET | Freq: Four times a day (QID) | ORAL | Status: DC | PRN
  Administered 2023-11-15 – 2023-11-17 (×4): 650 mg via ORAL
  Filled 2023-11-15 (×4): qty 2

## 2023-11-15 MED ORDER — TRAZODONE HCL 50 MG PO TABS: 25.0000 mg | ORAL_TABLET | Freq: Every day | ORAL | Status: DC

## 2023-11-15 NOTE — Progress Notes (Signed)
 Occupational Therapy Discharge Summary  Patient Details  Name: [name] Brown MRN: [id number] Date of Birth: [birth year]  Date of Discharge from OT service:November 17, 2023   Patient has met 8 of 8 long term goals due to improved activity tolerance, improved balance, ability to compensate for deficits, and functional use of  RIGHT lower extremity.  Patient to discharge at overall Modified Independent level.  Patient's care partner is independent to provide the necessary physical assistance for higher-level IADLs at discharge.    Reasons goals not met: NA  Recommendation:  Patient will benefit from ongoing skilled OT services in home health setting to continue to advance functional skills in the area of BADL, iADL, and Vocation.  Equipment: BSC & RW  Reasons for discharge: treatment goals met and discharge from hospital  Patient/family agrees with progress made and goals achieved: Yes  OT Discharge Precautions/Restrictions  Precautions Precautions: Fall Restrictions Weight Bearing Restrictions Per Provider Order: Yes RLE Weight Bearing Per Provider Order: Touchdown weight bearing RLE Partial Weight Bearing Percentage or Pounds: 25 ADL ADL Eating: Independent Where Assessed-Eating: Wheelchair Grooming: Modified independent Where Assessed-Grooming: Sitting at sink, Wheelchair Upper Body Bathing: Modified independent Where Assessed-Upper Body Bathing: Shower Lower Body Bathing: Modified independent Where Assessed-Lower Body Bathing: Shower Upper Body Dressing: Independent Where Assessed-Upper Body Dressing: Edge of bed Lower Body Dressing: Modified independent Where Assessed-Lower Body Dressing: Edge of bed Toileting: Modified independent Where Assessed-Toileting: Toilet, Bedside Commode, Other (Comment) (Urinal for nightime toileting) Toilet Transfer: Modified independent Toilet Transfer Method: Stand pivot, Ambulating Toilet Transfer Equipment: Multimedia programmer: Close supervision Film/video editor Method: Designer, industrial/product: Grab bars, Transfer tub bench (Use of BSC at home for increased functional reach) Vision Baseline Vision/History: 1 Wears glasses Patient Visual Report: No change from baseline Vision Assessment?: No apparent visual deficits Perception  Perception: Within Functional Limits Praxis Praxis: WFL Cognition Cognition Overall Cognitive Status: Within Functional Limits for tasks assessed Arousal/Alertness: Awake/alert Memory: Appears intact Awareness: Appears intact Problem Solving: Appears intact Safety/Judgment: Appears intact Sensation Sensation Light Touch: Impaired Detail Light Touch Impaired Details: Impaired RLE Coordination Gross Motor Movements are Fluid and Coordinated: No Fine Motor Movements are Fluid and Coordinated: Yes Coordination and Movement Description: Deficits due to RLE TWB precautions and surgical pain. Motor  Motor Motor: Other (comment) Motor - Discharge Observations: Post-surgical RLE weakness/decreased ROM Mobility  Transfers Sit to Stand: Independent with assistive device Stand to Sit: Independent with assistive device  Trunk/Postural Assessment  Cervical Assessment Cervical Assessment: Within Functional Limits Thoracic Assessment Thoracic Assessment: Within Functional Limits Lumbar Assessment Lumbar Assessment: Within Functional Limits Postural Control Postural Control: Within Functional Limits  Balance Balance Balance Assessed: Yes Static Sitting Balance Static Sitting - Balance Support: Feet supported Static Sitting - Level of Assistance: 7: Independent Dynamic Sitting Balance Dynamic Sitting - Balance Support: Feet supported;During functional activity Dynamic Sitting - Level of Assistance: 6: Modified independent (Device/Increase time) Dynamic Sitting - Balance Activities: Lateral lean/weight shifting;Reaching for objects Sitting balance -  Comments: Pt weight-shifts onto L in sitting to manage R-hip pain. Static Standing Balance Static Standing - Balance Support: During functional activity;Bilateral upper extremity supported Static Standing - Level of Assistance: 6: Modified independent (Device/Increase time) Dynamic Standing Balance Dynamic Standing - Balance Support: During functional activity;Bilateral upper extremity supported Dynamic Standing - Level of Assistance: 6: Modified independent (Device/Increase time) Dynamic Standing - Balance Activities: Reaching for objects;Forward lean/weight shifting Extremity/Trunk Assessment RUE Assessment RUE Assessment: Within Functional Limits LUE Assessment LUE Assessment: Within  Functional Limits   Lou Cal, OTR/L, MSOT  11/15/2023, 5:17 AM

## 2023-11-15 NOTE — Progress Notes (Signed)
 Occupational Therapy Session Note  Patient Details  Name: [name] Brown MRN: [id number] Date of Birth: 09-29-1950  Today's Date: 11/15/2023 OT Individual Time: 4098-1191 OT Individual Time Calculation (min): 40 min    Short Term Goals: Week 1:  OT Short Term Goal 1 (Week 1): Pt will perform toileting tasks with contact guard for balance OT Short Term Goal 2 (Week 1): Pt will thread LB clothing (underwear and pants) with superivsion with AE prn OT Short Term Goal 3 (Week 1): Pt will be given HEP for right Le to improve mobility for ADL performance  Skilled Therapeutic Interventions/Progress Updates:    Patient aware of specific weight bearing precautions.  Received supine in bed with ice on thigh.  Patient concerned with edema throughout RLE.  Compression stocking on BLE to knee, and wrapped up to upper thigh to reduce edema.  Patient able to walk to bathroom to void with distant supervision, safely using walker.  Left up in bed with bed alarm engaged.  Ice reapplied to thigh.    Therapy Documentation Precautions:  Precautions Precautions: Fall Restrictions Weight Bearing Restrictions Per Provider Order: Yes RLE Weight Bearing Per Provider Order: Touchdown weight bearing RLE Partial Weight Bearing Percentage or Pounds: 25   Pain: Pain Assessment Pain Scale: 0-10 Pain Score: 6  Pain Location: Hip Pain Orientation: Right Pain Intervention(s): Medication (See eMAR);Hot/Cold interventions     Therapy/Group: Individual Therapy  Collier Salina 11/15/2023, 12:53 PM

## 2023-11-15 NOTE — Progress Notes (Signed)
 PROGRESS NOTE   Subjective/Complaints: Patient has had compression for his right lower extremity, a little itchy at times.  He was started on Celexa yesterday.  He asked about NSAIDs, he has an article with recent meta-analysis indicating no effect on bone healing.  ROS: Patient denies fever, new vision changes, dizziness, nausea, vomiting, diarrhea,  shortness of breath or chest pain, headache  + Depression  + Constipation- improved + R leg pain and swelling- continued, continued with medications   Objective:   No results found. Recent Labs    11/13/23 0610  WBC 6.1  HGB 10.1*  HCT 29.3*  PLT 351    Recent Labs    11/13/23 0610  NA 137  K 4.1  CL 104  CO2 25  GLUCOSE 106*  BUN 22  CREATININE 1.05  CALCIUM 8.8*     Intake/Output Summary (Last 24 hours) at 11/15/2023 1418 Last data filed at 11/15/2023 1138 Gross per 24 hour  Intake 450.48 ml  Output 1100 ml  Net -649.52 ml        Physical Exam: Vital Signs Blood pressure 123/63, pulse 81, temperature 97.9 F (36.6 C), temperature source Oral, resp. rate 18, height 5\' 6"  (1.676 m), weight 87.4 kg, SpO2 94%. Gen: no distress, normal appearing, laying in bed HEENT: oral mucosa pink and moist, NCAT Cardio: RRR, + murmur Chest: CTAB, normal effort, normal rate of breathing, on room air Abd: soft, non-distended, +BS- normoactive  Ext: RLE edema edema Psych: pleasant, normal affect Skin: Clean and intact without signs of breakdown Neuro:     Mental Status: He is alert and oriented to person, place, and time.     Comments: Intact to light touch in all 4 extremities  Musculoskeletal:    Cervical back: Neck supple. No tenderness.     Comments: 2 + edema right thigh, knee and foot. Covered in ace wrap today Moving all 4 extremities sitting in chair  Prior Exam UE strength 5/5 in B/L Ue's  LLE- 5/5 in HF, KE, KF, DF and PF RLE HF 2-/5; KE/KF 2-/5; DF/PF  5/5   Assessment/Plan: 1. Functional deficits which require 3+ hours per day of interdisciplinary therapy in a comprehensive inpatient rehab setting. Physiatrist is providing close team supervision and 24 hour management of active medical problems listed below. Physiatrist and rehab team continue to assess barriers to discharge/monitor patient progress toward functional and medical goals  Care Tool:  Bathing    Body parts bathed by patient: Right arm, Left arm, Chest, Abdomen, Front perineal area, Buttocks, Right upper leg, Left upper leg, Face   Body parts bathed by helper: Right lower leg, Left lower leg     Bathing assist Assist Level: Minimal Assistance - Patient > 75%     Upper Body Dressing/Undressing Upper body dressing   What is the patient wearing?: Pull over shirt    Upper body assist Assist Level: Set up assist    Lower Body Dressing/Undressing Lower body dressing      What is the patient wearing?: Underwear/pull up, Pants     Lower body assist Assist for lower body dressing: Maximal Assistance - Patient 25 - 49%  Toileting Toileting    Toileting assist Assist for toileting: Supervision/Verbal cueing     Transfers Chair/bed transfer  Transfers assist     Chair/bed transfer assist level: Minimal Assistance - Patient > 75%     Locomotion Ambulation   Ambulation assist      Assist level: Supervision/Verbal cueing Assistive device: Walker-rolling Max distance: 150+   Walk 10 feet activity   Assist     Assist level: Supervision/Verbal cueing Assistive device: Walker-rolling   Walk 50 feet activity   Assist Walk 50 feet with 2 turns activity did not occur: Safety/medical concerns (pain/fatigue)  Assist level: Supervision/Verbal cueing Assistive device: Walker-rolling    Walk 150 feet activity   Assist Walk 150 feet activity did not occur: Safety/medical concerns  Assist level: Supervision/Verbal cueing Assistive device:  Walker-rolling    Walk 10 feet on uneven surface  activity   Assist Walk 10 feet on uneven surfaces activity did not occur: Safety/medical concerns         Wheelchair     Assist Is the patient using a wheelchair?: Yes Type of Wheelchair: Manual    Wheelchair assist level: Supervision/Verbal cueing Max wheelchair distance: 150    Wheelchair 50 feet with 2 turns activity    Assist        Assist Level: Supervision/Verbal cueing   Wheelchair 150 feet activity     Assist      Assist Level: Supervision/Verbal cueing   Blood pressure 123/63, pulse 81, temperature 97.9 F (36.6 C), temperature source Oral, resp. rate 18, height 5\' 6"  (1.676 m), weight 87.4 kg, SpO2 94%.  Medical Problem List and Plan: 1. Functional deficits secondary to R femur fx s/p IM nail- 25% PWB x 2 weeks, then 50 % for 2 weeks, then WBAT             -patient may  shower- cover incision             -ELOS/Goals: 7-10 days mod I             -continue CIR, evaluations today             -Team conference completed yesterday, expected discharge pending as he was pending evaluations             -2/27 bed level activity due to DVT             -2/28 DC bed level  -Possible DC end of this Week/Friday  -Team conference tomorrow  -Consider removing staples Friday -2 weeks  2.  Antithrombotics: -DVT/anticoagulation:  Pharmaceutical: continue lovenox             -antiplatelet therapy: ASA daily-->increase to BID at discharge.   3. Pain Management:  Currently dilaudid 1 mg IV qid w/ oxycodone 5 mg qid. --continue oxycodone to 5-10 mg and premedicate prior to therapy- stop IV Dilaudid --ice prn. Flexeril prn for muscle spasms.  -Discussed would avoid NSAIDs, also increased bleeding risk due to Eliquis   4. Mood/Behavior/Sleep: Monitor sleep hygiene. Continue delirium precautions             --continue Repuip at nights for RLS. Trazodone prn for sleep.             -antipsychotic agents: N/A              2/27 scheduled for neuropsych due to report this afternoon for therapy of decreased mood.  Consider addition of SSRI  3/4 seen by neuropsychology, plan for outpatient therapy, behavioral  health  3/4 Celexa 5mg  was started  3/5 Consider increase celexa dose 5. Neuropsych/cognition: This patient is capable of making decisions on his own behalf. 6. Skin/Wound Care: Routine pressure relief measures.  7. Fluids/Electrolytes/Nutrition: Monitor I/O. Check CMET in am.  8. IM nail Right femur: 25% PWB thorough 03/07  --followed by 50% WB X 2 weeks then WBAT  9. HTN: Monitor BP TID--continue cardizem  3/5 BP controlled    11/15/2023    4:26 AM 11/14/2023   11:13 PM 11/14/2023    9:22 PM  Vitals with BMI  Systolic 123 114 161  Diastolic 63 56 60  Pulse 81 80 81    10. Non-obstructive CAD w/recovered EF: continue Losartan, ASA and statin.   -Denies Chest pain 11.  Abnormal LFTs: Recheck labs in am             2/26 AST still elevated at 70, avoid hepatotoxic medication and monitor.  Change Tylenol to every 6 hours as needed from every 4 hours as needed.             -2/27 today patient indicated increased alcohol use to therapy that may be contributing to this             -3/3 lfts improved today  12. ABLA: Recheck CBC in am             -2/27 hemoglobin stable at 10.2             3/3 HGB stable  13. Constipation: Will schedule Senna with supper. Doesn't want Sorbitol              -2/26 LMB late last night, continue to monitor              2/27 LBM yesterday, continue to monitor             2/28 Mg citrate ordered this afternoon  3/1: enema ordered  3/5 LBM yesterday, continue to monitor   14. RLE edema- 2+ from R hip to R foot- monitor- might need dopplers             -2/27 DVT right proximal profunda, start Eliquis in bed level activity today             Continue eliquis, monitor RLE swelling- a little improved  -Continue compression to RLE        LOS: 8 days A FACE TO FACE  EVALUATION WAS PERFORMED  Fanny Dance 11/15/2023, 2:18 PM

## 2023-11-15 NOTE — Progress Notes (Deleted)
 Patient request sleeping medication , notified on callElane Brown received and administered

## 2023-11-15 NOTE — Progress Notes (Signed)
 Patient ID: [name] Brown, male   DOB: [birth year], 73 y.o.   MRN: [id number]  Met with pt to give him the team conference with goals of mod/I and discharge date 3/7. He is pleased with this plan and reports his wife is much calmer with seeing his progress and actually doing family education this week. Have equipment already. Has no preference and regarding home health agencies.

## 2023-11-15 NOTE — Progress Notes (Signed)
 Occupational Therapy Session Note  Patient Details  Name: [name] Brown MRN: [id number] Date of Birth: 1951-03-03  Today's Date: 11/15/2023 OT Individual Time: 4098-1191 OT Individual Time Calculation (min): 57 min    Short Term Goals: Week 1:  OT Short Term Goal 1 (Week 1): Pt will perform toileting tasks with contact guard for balance OT Short Term Goal 2 (Week 1): Pt will thread LB clothing (underwear and pants) with superivsion with AE prn OT Short Term Goal 3 (Week 1): Pt will be given HEP for right Le to improve mobility for ADL performance  Skilled Therapeutic Interventions/Progress Updates:  Pt greeted resting in bed for skilled OT session with focus on BADL participation.   Pain: Pt reported 5/10 surgical pain, OT offering intermediate rest breaks and positioning suggestions throughout session to address pain/fatigue and maximize participation/safety in session.   Functional Transfers: Bed mobility at Mod I level with HOB elevated and hooking of RLE. All transfers at supervision level, progressing to Mod I.   Self Care Tasks: Pt completes the following self care tasks with levels of assistance noted below, UB: Bathing and dressing at setup/supervision level, progressing to Mod I.  LB: Undressing with cueing to complete unthreading in seated position vs standing. Threading/hiking at supervision progressing to Mod I. Bathing with close supervision + grab bar for standing pericare. Max A for donning B TEDs.  Thera-Activity: Pt instructed in 1x10 reps of step-ups onto 1.75 in step for carryover into walk-in shower transfer and general conditioning. Pt completes with CGA + RW, RW height adjusted to promote increased adherence to WB precautions. Pt ambulates from main therapy gym>room at supervision level + RW.   Pt remained sitting in WC with 4Ps assessed and immediate needs met. Pt continues to be appropriate for skilled OT intervention to promote further functional independence in  ADLs/IADLs.   Therapy Documentation Precautions:  Precautions Precautions: Fall Restrictions Weight Bearing Restrictions Per Provider Order: Yes RLE Weight Bearing Per Provider Order: Partial weight bearing RLE Partial Weight Bearing Percentage or Pounds: 25   Therapy/Group: Individual Therapy  Lou Cal, OTR/L, MSOT  11/15/2023, 5:08 AM

## 2023-11-15 NOTE — Patient Care Conference (Signed)
 Inpatient RehabilitationTeam Conference and Plan of Care Update Date: 11/15/2023   Time: 12:29 PM    Patient Name: [name] Brown      Medical Record Number: [id number]  Date of Birth: 1951-09-08 Sex: Male         Room/Bed: 4M06C/4M06C-01 Payor Info: Payor: MEDICARE / Plan: MEDICARE PART A AND B / Product Type: *No Product type* /    Admit Date/Time:  11/07/2023  1:21 PM  Primary Diagnosis:  Fracture, proximal femur, right, sequela  Hospital Problems: Principal Problem:   Fracture, proximal femur, right, sequela Active Problems:   Obsessive compulsive disorder    Expected Discharge Date: Expected Discharge Date: 11/17/23  Team Members Present: Physician leading conference: Dr. Fanny Dance Social Worker Present: Dossie Der, LCSW Nurse Present: Chana Bode, RN PT Present: Ambrose Finland, PT OT Present: Lou Cal, OT PPS Coordinator present : Fae Pippin, SLP     Current Status/Progress Goal Weekly Team Focus  Bowel/Bladder   Continent of Bladder/Bowel, LBM 11/13/23, as stool softner and laxative prn   Maintain continence while on IP Rehab   Assess toileting needs QS/PRN, assist as needed    Swallow/Nutrition/ Hydration               ADL's   Supervision progressing to Mod I for ADL participation   Mod I   General conditioning & Discharge planning    Mobility   pt at goal level, family training completed 3/4, DME delivered and adjusted on 3/4.   mod I/sup  D/C 3/7 to allow pt bed to be delivered to first floor, continuing to work on pt R LE strength/ROM for improved independence with functional mobility    Communication                Safety/Cognition/ Behavioral Observations               Pain   Rate surgical oaun to right hip 03/19/09 on pain scale, medicated with schedule and prn medication as ordered   GOAL 3/10   Assess pain QS/PRN, provide , ice pack to right surgical hip/thigh/leg prn, elevate on pillows    Skin   Skin intact,  s/p x 3surfical dressings intact to right hip and thigh area, along with ace wrap   Prevention of infection  Assess skin and surgical incision site QS/PRN,      Discharge Planning:  Home with wife who has been in for education and feels comfortable with care. Will arrange [facility] and have ordered DME   Team Discussion: Patient post right femur fracture with edema and pain in affected limb. Limited by decreased ROM in limb and weight bearing restrictions.   Patient on target to meet rehab goals: yes, currently needs mod I for ADLs and rising from standard bed.    *See Care Plan and progress notes for long and short-term goals.   Revisions to Treatment Plan:  N/a   Teaching Needs: Safety, medications, transfers, toileting, etc.   Current Barriers to Discharge: Decreased caregiver support, Home enviroment access/layout, and Weight bearing restrictions  Possible Resolutions to Barriers: Family education 11/14/23 DME delivered and adjusted     Medical Summary Current Status: femur fx, pain, abnormal lfts, constipation, depression, edema  Barriers to Discharge: Behavior/Mood;Electrolyte abnormality;Medical stability;Self-care education;Uncontrolled Pain  Barriers to Discharge Comments: femur fx, pain, abnormal lfts, constipation, depression, edema Possible Resolutions to [employer] Focus: monitor labs, antidepressant started, leg wraps, monitor bowel function   Continued Need for Acute Rehabilitation Level of Care: The patient  requires daily medical management by a physician with specialized training in physical medicine and rehabilitation for the following reasons: Direction of a multidisciplinary physical rehabilitation program to maximize functional independence : Yes Medical management of patient stability for increased activity during participation in an intensive rehabilitation regime.: Yes Analysis of laboratory values and/or radiology reports with any subsequent need for  medication adjustment and/or medical intervention. : Yes   I attest that I was present, lead the team conference, and concur with the assessment and plan of the team.   Chana Bode B 11/15/2023, 4:02 PM

## 2023-11-15 NOTE — Progress Notes (Signed)
 Physical Therapy Session Note  Patient Details  Name: [name] Brown MRN: [id number] Date of Birth: 09/13/1950  Today's Date: 11/15/2023 PT Individual Time: 1002-1100, 1446-1530 PT Individual Time Calculation (min): 58 min, 44 min   Short Term Goals: Week 1:  PT Short Term Goal 1 (Week 1): Pt will perform sit to stand with RW and supervision PT Short Term Goal 2 (Week 1): Pt will perform stand pivot transfer with RW and supervision PT Short Term Goal 3 (Week 1): pt will ambulate 50 feet with RW and CGA  Skilled Therapeutic Interventions/Progress Updates:      Treatment Session 1  Pt supine in bed upon arrival. Pt reports RLE 7/10 pain, pt reports he has been doing a lot of moving to allow PA to wrap R LE in ace bandage for management of R LE edema, premedicated. Therapist provided rest breaks and repositioning throughout session, and elevated R LE at end of session.   Reviewed and performed the following HEP:   Education provided to perform the standing exercises in the morning, the seated exericses in the afternoon and the lying exercises in the evening--to promote increase knee and hip ROM in all planes and functional use of R LE for improved independence. Emphasized kicking out R LE only for standing exercises for maintenance of R LE Wbing precautions. Recommended use of belt for supine exercises for active assisted ROM, encouraged pt to activate as much as he can and utilize belt to assist with remainder of available ROM. Pt demos good carry of this.   Access Code: [email or web address] URL: [email or web address] Date: 11/15/2023 Prepared by: Ambrose Finland  Exercises - Standing Hip Abduction with Counter Support  - 1 x daily - 7 x weekly - 3 sets - 10 reps - Standing Hip Extension with Counter Support  - 1 x daily - 7 x weekly - 3 sets - 10 reps - Standing March with Counter Support  - 1 x daily - 7 x weekly - 3 sets - 10 reps - Standing Knee Flexion with Counter Support  - 1 x  daily - 7 x weekly - 3 sets - 10 reps - Active Straight Leg Raise with Quad Set  - 1 x daily - 7 x weekly - 3 sets - 10 reps - Supine Hip Abduction  - 1 x daily - 7 x weekly - 3 sets - 10 reps - Supine Heel Slide  - 1 x daily - 7 x weekly - 3 sets - 10 reps - Seated Long Arc Quad  - 1 x daily - 7 x weekly - 3 sets - 10 reps - Seated Heel Slide  - 1 x daily - 7 x weekly - 3 sets - 10 reps - Long Sitting Ankle Pumps  - 1 x daily - 7 x weekly - 3 sets - 10 reps  Pt asking if it is okay to swim upon discharge. Recommended pt not swim until incision heals and pt cleared by doctor with follow up post discharge. Emphasized importance of allowing it to heal and reducing risk of infection. Pt able to provide teach back of signs and symptoms of infection.   Pt reports increased swelling yesterday evening post therapies. Education provided regarding fluctuation in swelling, especially with increased dependent positioning. Emphasized importance of elevating R LE above heart, to help reduce overall swelling.   Pt supine in bed with R LE elevated, needs within reach and bed alarm on at end of session.   Treatment Session 2  Pt seated in WC upon arrival. Pt agreeable to therapy. Pt denies any pain.   Pt self propelled WC to main gym independently.   Pt doffed leg rests, and performed stand pivot transfer WC to mat table with set up assist for RW.   Pt initiated stair training to assist with threshold navigation for shower entry.   Pt performed step ups x3 on 1 3/4 high step, x3 on 3 3/4 high step, and x3 on 5 3/4 high step with RW, and verbal cues/demonstration for maintenance on R LE TTWBing precautions. Recommend pt wait until follow up therapy to navigate flight of stairs to pt 2nd floor for safety and to ensure maintenance at of R LE Wbing precautions as the height of his steps are higher, and it requires increased endurance. Pt verbalized understanding and agreeable.   Pt provided seated rest breaks  for fatigue/management of precautions.   Pt ambulated main gym to room with RW and mod I, pt demos good adherence to R LE weight bearing precautions.   Pt seated in WC at end of session with all needs within reach and bed alarm on.    Therapy Documentation Precautions:  Precautions Precautions: Fall Restrictions Weight Bearing Restrictions Per Provider Order: Yes RLE Weight Bearing Per Provider Order: Touchdown weight bearing RLE Partial Weight Bearing Percentage or Pounds: 25  Therapy/Group: Individual Therapy  [facility] Ambrose Finland, [place], DPT  11/15/2023, 7:51 AM

## 2023-11-16 ENCOUNTER — Other Ambulatory Visit (HOSPITAL_COMMUNITY): Payer: Self-pay

## 2023-11-16 LAB — CBC
HCT: 32.7 % — ABNORMAL LOW (ref 39.0–52.0)
Hemoglobin: 10.9 g/dL — ABNORMAL LOW (ref 13.0–17.0)
MCH: 32.5 pg (ref 26.0–34.0)
MCHC: 33.3 g/dL (ref 30.0–36.0)
MCV: 97.6 fL (ref 80.0–100.0)
Platelets: 393 10*3/uL (ref 150–400)
RBC: 3.35 MIL/uL — ABNORMAL LOW (ref 4.22–5.81)
RDW: 13.9 % (ref 11.5–15.5)
WBC: 5.6 10*3/uL (ref 4.0–10.5)
nRBC: 0 % (ref 0.0–0.2)

## 2023-11-16 LAB — BASIC METABOLIC PANEL
Anion gap: 8 (ref 5–15)
BUN: 19 mg/dL (ref 8–23)
CO2: 24 mmol/L (ref 22–32)
Calcium: 8.9 mg/dL (ref 8.9–10.3)
Chloride: 107 mmol/L (ref 98–111)
Creatinine, Ser: 1.19 mg/dL (ref 0.61–1.24)
GFR, Estimated: >60 mL/min (ref >60)
Glucose, Bld: 106 mg/dL — ABNORMAL HIGH (ref 70–99)
Potassium: 4.2 mmol/L (ref 3.5–5.1)
Sodium: 139 mmol/L (ref 135–145)

## 2023-11-16 MED ORDER — DILTIAZEM HCL ER COATED BEADS 120 MG PO CP24: 120.0000 mg | ORAL_CAPSULE | Freq: Every day | ORAL | Status: DC

## 2023-11-16 MED ORDER — OXYCODONE HCL 5 MG PO TABS
5.0000 mg | ORAL_TABLET | Freq: Four times a day (QID) | ORAL | 0 refills | Status: DC | PRN
Start: 2023-11-16 — End: 2023-12-19
  Filled 2023-11-16: qty 42, 7d supply, fill #0
  Filled 2023-11-16: qty 40, 5d supply, fill #0

## 2023-11-16 MED ORDER — APIXABAN 5 MG PO TABS
5.0000 mg | ORAL_TABLET | Freq: Two times a day (BID) | ORAL | 0 refills | Status: DC
  Filled 2023-11-16: qty 60, 30d supply, fill #0

## 2023-11-16 MED ORDER — ACETAMINOPHEN 325 MG PO TABS
325.0000 mg | ORAL_TABLET | Freq: Four times a day (QID) | ORAL | Status: DC | PRN
Start: 2023-11-16 — End: 2023-11-16

## 2023-11-16 MED ORDER — CYCLOBENZAPRINE HCL 5 MG PO TABS
5.0000 mg | ORAL_TABLET | Freq: Three times a day (TID) | ORAL | 0 refills | Status: DC | PRN
  Filled 2023-11-16: qty 60, 20d supply, fill #0

## 2023-11-16 MED ORDER — SENNOSIDES-DOCUSATE SODIUM 8.6-50 MG PO TABS
2.0000 | ORAL_TABLET | Freq: Every day | ORAL | 0 refills | Status: DC
  Filled 2023-11-16: qty 60, 30d supply, fill #0

## 2023-11-16 MED ORDER — OXYCODONE HCL 5 MG PO TABS: 5.0000 mg | ORAL_TABLET | Freq: Four times a day (QID) | ORAL | Status: DC | PRN

## 2023-11-16 MED ORDER — CITALOPRAM HYDROBROMIDE 10 MG PO TABS
10.0000 mg | ORAL_TABLET | Freq: Every day | ORAL | 0 refills | Status: DC
  Filled 2023-11-16: qty 30, 30d supply, fill #0

## 2023-11-16 MED ORDER — CITALOPRAM HYDROBROMIDE 20 MG PO TABS
10.0000 mg | ORAL_TABLET | Freq: Every day | ORAL | Status: DC
  Administered 2023-11-17: 10 mg via ORAL
  Filled 2023-11-16: qty 1

## 2023-11-16 NOTE — Plan of Care (Signed)
  Problem: RH Balance Goal: LTG Patient will maintain dynamic sitting balance (PT) Description: LTG:  Patient will maintain dynamic sitting balance with assistance during mobility activities (PT) Outcome: Completed/Met Goal: LTG Patient will maintain dynamic standing balance (PT) Description: LTG:  Patient will maintain dynamic standing balance with assistance during mobility activities (PT) Outcome: Completed/Met   Problem: Sit to Stand Goal: LTG:  Patient will perform sit to stand with assistance level (PT) Description: LTG:  Patient will perform sit to stand with assistance level (PT) Outcome: Completed/Met   Problem: RH Bed Mobility Goal: LTG Patient will perform bed mobility with assist (PT) Description: LTG: Patient will perform bed mobility with assistance, with/without cues (PT). Outcome: Completed/Met   Problem: RH Bed to Chair Transfers Goal: LTG Patient will perform bed/chair transfers w/assist (PT) Description: LTG: Patient will perform bed to chair transfers with assistance (PT). Outcome: Completed/Met   Problem: RH Car Transfers Goal: LTG Patient will perform car transfers with assist (PT) Description: LTG: Patient will perform car transfers with assistance (PT). Outcome: Completed/Met   Problem: RH Ambulation Goal: LTG Patient will ambulate in controlled environment (PT) Description: LTG: Patient will ambulate in a controlled environment, # of feet with assistance (PT). Outcome: Completed/Met Goal: LTG Patient will ambulate in home environment (PT) Description: LTG: Patient will ambulate in home environment, # of feet with assistance (PT). Outcome: Completed/Met   Problem: RH Wheelchair Mobility Goal: LTG Patient will propel w/c in controlled environment (PT) Description: LTG: Patient will propel wheelchair in controlled environment, # of feet with assist (PT) Outcome: Completed/Met Goal: LTG Patient will propel w/c in home environment (PT) Description: LTG:  Patient will propel wheelchair in home environment, # of feet with assistance (PT). Outcome: Completed/Met

## 2023-11-16 NOTE — Progress Notes (Signed)
 Patient endorses concerns of bruise on right inner leg PA has been notified.   PT also requesting lidocaine patch and anti-itch lotion. PA made aware. RN waiting for response.

## 2023-11-16 NOTE — Discharge Summary (Signed)
 Physician Discharge Summary  Patient ID: [name] Brown MRN: [id number] DOB/AGE: [birth year] 73 y.o.  Admit date: 11/07/2023 Discharge date: 11/17/2023  Discharge Diagnoses:  Principal Problem:   Fracture, proximal femur, right, sequela Active Problems:   Slow transit constipation   Obesity (BMI 30-39.9)   Obsessive compulsive disorder   Discharged Condition: stable  Significant Diagnostic Studies: VAS [place] LOWER EXTREMITY VENOUS (DVT) Result Date: 11/08/2023  Lower Venous DVT Study Patient Name:  [name] Brown  Date of Exam:   11/08/2023 Medical Rec #: [id number]     Accession #:    [id number] Date of Birth: 22-Jan-1951    Patient Gender: M Patient Age:   73 years Exam Location:  [facility] Procedure:      VAS [place] LOWER EXTREMITY VENOUS (DVT) Referring Phys: Kye Silverstein --------------------------------------------------------------------------------  Indications: Swelling, and Post-op. Other Indications: Right femur fracture. Comparison Study: No prior exam. Performing Technologist: Fernande Bras  Examination Guidelines: A complete evaluation includes B-mode imaging, spectral Doppler, color Doppler, and power Doppler as needed of all accessible portions of each vessel. Bilateral testing is considered an integral part of a complete examination. Limited examinations for reoccurring indications may be performed as noted. The reflux portion of the exam is performed with the patient in reverse Trendelenburg.  +---------+---------------+---------+-----------+----------+--------------+ RIGHT    CompressibilityPhasicitySpontaneityPropertiesThrombus Aging +---------+---------------+---------+-----------+----------+--------------+ CFV      Full           Yes      Yes                                 +---------+---------------+---------+-----------+----------+--------------+ SFJ      Full           Yes      Yes                                  +---------+---------------+---------+-----------+----------+--------------+ FV Prox  Full                                                        +---------+---------------+---------+-----------+----------+--------------+ FV Mid   Full                                                        +---------+---------------+---------+-----------+----------+--------------+ FV DistalFull                                                        +---------+---------------+---------+-----------+----------+--------------+ PFV      Partial        Yes      Yes                                 +---------+---------------+---------+-----------+----------+--------------+ POP      Full           Yes      Yes                                 +---------+---------------+---------+-----------+----------+--------------+  PTV      Full                                                        +---------+---------------+---------+-----------+----------+--------------+ PERO     Full                                                        +---------+---------------+---------+-----------+----------+--------------+ A small segment of the right profunda vein is thrombosed.  +----+---------------+---------+-----------+----------+--------------+ LEFTCompressibilityPhasicitySpontaneityPropertiesThrombus Aging +----+---------------+---------+-----------+----------+--------------+ CFV Full           Yes      Yes                                 +----+---------------+---------+-----------+----------+--------------+ SFJ Full           Yes      Yes                                 +----+---------------+---------+-----------+----------+--------------+    Summary: RIGHT: - Findings consistent with acute deep vein thrombosis involving the right proximal profunda vein.  - No cystic structure found in the popliteal fossa.  LEFT: - No evidence of common femoral vein obstruction.   *See table(s) above for  measurements and observations. Electronically signed by Coral Else MD on 11/08/2023 at 8:39:34 PM.    Final    VAS [place] UPPER EXTREMITY VENOUS DUPLEX Result Date: 11/08/2023 UPPER VENOUS STUDY  Patient Name:  [name] Brown  Date of Exam:   11/07/2023 Medical Rec #: [id number]     Accession #:    [id number] Date of Birth: 01-14-1951    Patient Gender: M Patient Age:   73 years Exam Location:  [facility] Procedure:      VAS [place] UPPER EXTREMITY VENOUS DUPLEX Referring Phys: Casondra Gasca --------------------------------------------------------------------------------  Indications: Immobility Risk Factors: Immobility Cancer Colon Surgery IM Nail 11/03/23. Comparison Study: None. Performing Technologist: Shona Simpson  Examination Guidelines: A complete evaluation includes B-mode imaging, spectral Doppler, color Doppler, and power Doppler as needed of all accessible portions of each vessel. Bilateral testing is considered an integral part of a complete examination. Limited examinations for reoccurring indications may be performed as noted.  Right Findings: +----------+------------+---------+-----------+----------+-------+ RIGHT     CompressiblePhasicitySpontaneousPropertiesSummary +----------+------------+---------+-----------+----------+-------+ IJV           Full       Yes       Yes                      +----------+------------+---------+-----------+----------+-------+ Subclavian    Full       Yes       Yes                      +----------+------------+---------+-----------+----------+-------+ Axillary      Full       Yes       Yes                      +----------+------------+---------+-----------+----------+-------+ Brachial  Full       Yes       Yes                      +----------+------------+---------+-----------+----------+-------+ Radial        Full       Yes       Yes                      +----------+------------+---------+-----------+----------+-------+  Ulnar         Full       Yes       Yes                      +----------+------------+---------+-----------+----------+-------+ Cephalic      Full                 Yes                      +----------+------------+---------+-----------+----------+-------+ Basilic       Full                 Yes                      +----------+------------+---------+-----------+----------+-------+  Left Findings: +----------+------------+---------+-----------+------------------+-------+ LEFT      CompressiblePhasicitySpontaneous    Properties    Summary +----------+------------+---------+-----------+------------------+-------+ IJV           Full       Yes       Yes                              +----------+------------+---------+-----------+------------------+-------+ Subclavian    Full       Yes       Yes                              +----------+------------+---------+-----------+------------------+-------+ Axillary      Full       Yes       Yes                              +----------+------------+---------+-----------+------------------+-------+ Brachial      Full       Yes       Yes                              +----------+------------+---------+-----------+------------------+-------+ Radial        Full       Yes       Yes                              +----------+------------+---------+-----------+------------------+-------+ Ulnar         Full       Yes       Yes                              +----------+------------+---------+-----------+------------------+-------+ Cephalic      None       No        No     brightly echogenic Acute  +----------+------------+---------+-----------+------------------+-------+ Basilic       Full  Yes                              +----------+------------+---------+-----------+------------------+-------+ Acute SVT seen in cephalic vein in the proximal forearm around and proximal to the IV line.  Summary:   Right: No evidence of deep vein thrombosis in the upper extremity. No evidence of superficial vein thrombosis in the upper extremity.  Left: No evidence of deep vein thrombosis in the upper extremity. Findings consistent with acute superficial vein thrombosis involving the left cephalic vein.  *See table(s) above for measurements and observations.  Diagnosing physician: Coral Else MD Electronically signed by Coral Else MD on 11/08/2023 at 4:42:00 PM.    Final     Labs:  Basic Metabolic Panel:    Latest Ref Rng & Units 11/16/2023    5:48 AM 11/13/2023    6:10 AM 11/09/2023    5:41 AM  BMP  Glucose 70 - 99 mg/dL 295  621  308   BUN 8 - 23 mg/dL 19  22  21    Creatinine 0.61 - 1.24 mg/dL 6.57  8.46  9.62   Sodium 135 - 145 mmol/L 139  137  136   Potassium 3.5 - 5.1 mmol/L 4.2  4.1  4.2   Chloride 98 - 111 mmol/L 107  104  99   CO2 22 - 32 mmol/L 24  25  26    Calcium 8.9 - 10.3 mg/dL 8.9  8.8  8.7      CBC:    Latest Ref Rng & Units 11/16/2023    5:48 AM 11/13/2023    6:10 AM 11/09/2023    5:41 AM  CBC  WBC 4.0 - 10.5 K/uL 5.6  6.1  5.9   Hemoglobin 13.0 - 17.0 g/dL 95.2  84.1  32.4   Hematocrit 39.0 - 52.0 % 32.7  29.3  29.5   Platelets 150 - 400 K/uL 393  351  247      CBG: No results for input(s): "GLUCAP" in the last 168 hours.  Brief HPI:   Jayleen Afonso is a 73 y.o. male with history of HTN, hypothyroid, nonobstructive CAD, colon cancer in the past; who was admitted on 11/03/2023 after fall onto the right hip with immediate onset of pain.  He was found to have right femur cephalomedullary fracture and underwent IM nailing on the same day by Dr. Zenaida Niece.  Postop to be 25% weightbearing x 2 weeks followed by 50% weightbearing x 2 weeks then weightbearing as tolerated.  Postop reported penile numbness as well as right thigh pain.  Reactive leukocytosis had resolved and acute blood loss anemia was being monitored.  Was on aspirin twice daily for DVT prophylaxis.  PT OT was working with  patient was requiring contact-guard assist to min assist for mobility and ADLs.  CIR was recommended due to functional decline.   Hospital Course: Jaedin Regina was admitted to rehab 11/07/2023 for inpatient therapies to consist of PT and OT at least three hours five days a week. Past admission physiatrist, therapy team and rehab RN have worked together to provide customized collaborative inpatient rehab.  His blood pressures were monitored on TID basis and have been stable.  He was noted to have edema and discomfort at left IV site and Dopplers done showed superficial thrombosis of left cephalic vein which was treated with local measures.  BLE Dopplers done showed acute DVT in right proximal profunda vein and he was started on  treatment dose Eliquis for treatment.  He continues to have 2+ edema right thigh and knee and has been educated on using compression as well as elevation after discharge.  Right hip incisions are healing well without any signs or symptoms of infection.  Staples were DC'd on day of discharge without difficulty.  He is showing improvement in maintaining weightbearing status and was advanced to partial weightbearing as 2 weeks postop at discharge  Pain was managed with use of oxycodone prior to therapy sessions to help with activity tolerance.  Oxy IR has also been used on as needed basis and he was educated on weaning this after discharge.  Follow-up CBC showed acute blood loss anemia to be resolving.  Check of BMET showed check of electrolytes and status to be within normal limits.  Dr. Rodenbough/Psychologist has worked with patient on coping and adjustment issues.  He was referred to [facility] behavioral health for further management after discharge.  Celexa was added and titrated to 10 mg daily by discharge.  He has made good gains during his stay and will continue to receive follow-up home health PT and OT by [facility] after discharge.   Rehab course: During patient's stay in  rehab weekly team conferences were held to monitor patient's progress, set goals and discuss barriers to discharge. At admission, patient required min to max assist for ADL task and mod assist with mobility. He  has had improvement in activity tolerance, balance, postural control as well as ability to compensate for deficits. He has had improvement in functional use RLE as well as improvement in awareness. He is able to complete ADL tasks at modified independent level.  He is modified independent for transfers and is able to ambulate 150 feet with use of rolling walker.  He requires contact-guard assist to climb 1 stairs.  Family education has been completed.   Discharge disposition: 06-Home-Health Care Svc  Diet: Regular  Special Instructions: Partial weight bearing RLE.  Use compression stockings or ace wrap from right upper thigh to mid foot to help with edema control. Low salt diet and elevation of RLE when seated or when in bed. 3. Wean oxycodone over 1-2 weeks as tolerated.   Discharge Instructions     Ambulatory referral to Psychiatry   Complete by: As directed    Depression/Coping/Anxiety      Allergies as of 11/17/2023   No Known Allergies      Medication List     STOP taking these medications    bisacodyl 5 MG EC tablet Commonly known as: DULCOLAX   feeding supplement Liqd   FLUoxetine 10 MG capsule Commonly known as: PROZAC   HYDROmorphone 1 MG/ML injection Commonly known as: DILAUDID   losartan 100 MG tablet Commonly known as: COZAAR       TAKE these medications    acetaminophen 325 MG tablet Commonly known as: TYLENOL Take 1-2 tablets (325-650 mg total) by mouth every 6 (six) hours as needed for mild pain (pain score 1-3).   aspirin EC 81 MG tablet Take 1 tablet (81 mg total) by mouth daily. What changed: when to take this   calcium carbonate 500 MG chewable tablet Commonly known as: TUMS - dosed in mg elemental calcium Chew 1 tablet (200 mg of  elemental calcium total) by mouth 3 (three) times daily as needed for indigestion or heartburn.   citalopram 10 MG tablet Commonly known as: CELEXA Take 1 tablet (10 mg total) by mouth daily.   CLEAR EYES  FOR DRY EYES OP Place 1 Drop/kg into both eyes as needed (Severe dry eye).   cyclobenzaprine 5 MG tablet Commonly known as: FLEXERIL Take 1 tablet (5 mg total) by mouth 3 (three) times daily as needed for muscle spasms.   diltiazem 120 MG 24 hr capsule Commonly known as: CARDIZEM CD Take 1 capsule (120 mg total) by mouth at bedtime.   Eliquis 5 MG Tabs tablet Generic drug: apixaban Take 1 tablet (5 mg total) by mouth 2 (two) times daily.   ezetimibe 10 MG tablet Commonly known as: ZETIA TAKE 1 TABLET BY MOUTH DAILY What changed: when to take this   famotidine 20 MG tablet Commonly known as: PEPCID Take 20 mg by mouth daily as needed for heartburn or indigestion.   folic acid 1 MG tablet Commonly known as: FOLVITE Take 1 tablet (1 mg total) by mouth daily.   levothyroxine 100 MCG tablet Commonly known as: SYNTHROID Take 1 tablet (100 mcg total) by mouth daily.   multivitamin with minerals Tabs tablet Take 1 tablet by mouth daily.   oxyCODONE 5 MG immediate release tablet--Rx# 50 pills Commonly known as: Oxy IR/ROXICODONE Take 1-2 tablets (5-10 mg total) by mouth every 6 (six) hours as needed for severe pain (pain score 7-10). What changed:  how much to take when to take this reasons to take this Notes to patient: In 1-2 days need to taper to one pill every 4-6 hours as needed for severe pain. Take 2 tylenol with this or in between to spread it out.    pravastatin 20 MG tablet Commonly known as: PRAVACHOL Take 1 tablet (20 mg total) by mouth at bedtime.   rOPINIRole 0.25 MG tablet Commonly known as: Requip Take 3 tablets (0.75 mg total) by mouth at bedtime.   Senna-S 8.6-50 MG tablet Generic drug: senna-docusate Take 2 tablets by mouth at bedtime. What  changed:  how much to take when to take this reasons to take this   thiamine 100 MG tablet Commonly known as: Vitamin B-1 Take 1 tablet (100 mg total) by mouth daily.   Xiidra 5 % Soln Generic drug: Lifitegrast Place 1 Dose into both eyes 2 (two) times daily.        Follow-up Information     Sheliah Hatch, MD Follow up.   Specialty: Family Medicine Why: Call in 1-2 days for post hospital follow up Contact information: [address] [place] [address] [phone]         Luci Bank, MD Follow up.   Specialty: Orthopedic Surgery Why: Call in 1-2 days for post hospital follow up Contact information: [address] [place] [address] [phone]         Fanny Dance, MD. Call.   Specialty: Physical Medicine and Rehabilitation Why: As needed Contact information: [address] [place] [address] [phone]                 Signed: Jacquelynn Cree 11/17/2023, 8:03 PM

## 2023-11-16 NOTE — Progress Notes (Signed)
 Physical Therapy Discharge Summary  Patient Details  Name: [name] Brown MRN: [id number] Date of Birth: Nov 21, 1950  Date of Discharge from PT service:November 16, 2023  Today's Date: 11/16/2023 PT Individual Time: 0865-7846, 9629-5284 PT Individual Time Calculation (min): 43 min, 70 min    Patient has met 10 of 10 long term goals due to improved activity tolerance, improved balance, increased strength, increased range of motion, decreased pain, ability to compensate for deficits, improved attention, improved awareness, and improved coordination.  Patient to discharge at a wheelchair level with short distance ambulation with RW Modified Independent.   Patient's care partner is independent to provide the necessary physical assistance at discharge.    Recommendation:  Patient will benefit from ongoing skilled PT services in home health setting to continue to advance safe functional mobility, address ongoing impairments in ROM, strength, gait, balance, stair navigation, and minimize fall risk.  Equipment: RW, pt purchased WC, leg lifter, and reacher   Reasons for discharge: treatment goals met and discharge from hospital  Patient/family agrees with progress made and goals achieved: Yes  PT Discharge Precautions/Restrictions Precautions Precautions: Fall Recall of Precautions/Restrictions: Intact Restrictions Weight Bearing Restrictions Per Provider Order: Yes RLE Weight Bearing Per Provider Order: Touchdown weight bearing RLE Partial Weight Bearing Percentage or Pounds: 25% until 3/7, then PWB 50% for 2 weeks, then WBAT Pain Interference Pain Interference Pain Effect on Sleep: 2. Occasionally Pain Interference with Therapy Activities: 1. Rarely or not at all Pain Interference with Day-to-Day Activities: 2. Occasionally Vision/Perception  Vision - History Ability to See in Adequate Light: 0 Adequate Perception Perception: Within Functional Limits Praxis Praxis: WFL   Cognition Overall Cognitive Status: Within Functional Limits for tasks assessed Arousal/Alertness: Awake/alert Orientation Level: Oriented X4 Focused Attention: Appears intact Sustained Attention: Appears intact Selective Attention: Appears intact Memory: Appears intact Awareness: Appears intact Problem Solving: Appears intact Safety/Judgment: Appears intact Sensation Sensation Light Touch: Appears Intact Peripheral sensation comments: pt reports groin sensation/and edema have returned to baseline Additional Comments: Pt denies any N&T,or differences in sensation between R LE/L LE, Ligth touch intact in all dermatomes Coordination Gross Motor Movements are Fluid and Coordinated: No Fine Motor Movements are Fluid and Coordinated: Yes Coordination and Movement Description: Deficits due to RLE TWB precautions and surgical pain. Motor  Motor Motor: Other (comment) Motor - Discharge Observations: Post-surgical RLE weakness/decreased ROM  Mobility Bed Mobility Bed Mobility: Rolling Left;Supine to Sit;Sit to Supine Rolling Left: Independent with assistive device Supine to Sit: Independent with assistive device Sit to Supine: Independent with assistive device Transfers Transfers: Sit to Stand;Stand to Sit;Stand Pivot Transfers Sit to Stand: Independent with assistive device Stand to Sit: Independent with assistive device Stand Pivot Transfers: Independent with assistive device Transfer (Assistive device): Rolling walker Locomotion  Gait Ambulation: Yes Gait Assistance: Independent with assistive device Gait Distance (Feet): 150 Feet Assistive device: Rolling walker Gait Gait: Yes Gait Pattern: Impaired Gait Pattern: Step-to pattern;Decreased stance time - right;Decreased step length - right;Decreased step length - left;Decreased stride length;Decreased hip/knee flexion - right (with R LE TDWBing) Gait velocity: decreased Stairs / Additional Locomotion Stairs: Yes Stairs  Assistance: Contact Guard/Touching assist Number of Stairs: 1 Height of Stairs: 5 Curb: Doctor, hospital Mobility: Yes Wheelchair Assistance: Independent with Scientist, research (life sciences): Both upper extremities Wheelchair Parts Management: Independent Distance: 150+  Trunk/Postural Assessment  Cervical Assessment Cervical Assessment: Within Functional Limits Thoracic Assessment Thoracic Assessment: Within Functional Limits Lumbar Assessment Lumbar Assessment: Within Functional Limits Postural Control Postural Control:  Within Functional Limits  Balance Balance Balance Assessed: Yes Static Sitting Balance Static Sitting - Balance Support: Feet supported Static Sitting - Level of Assistance: 7: Independent Dynamic Sitting Balance Dynamic Sitting - Balance Support: Feet supported;During functional activity Dynamic Sitting - Level of Assistance: 6: Modified independent (Device/Increase time) Dynamic Sitting - Balance Activities: Lateral lean/weight shifting;Reaching for objects Static Standing Balance Static Standing - Balance Support: During functional activity;Bilateral upper extremity supported Static Standing - Level of Assistance: 6: Modified independent (Device/Increase time) Dynamic Standing Balance Dynamic Standing - Balance Support: During functional activity;Bilateral upper extremity supported Dynamic Standing - Level of Assistance: 6: Modified independent (Device/Increase time) Extremity Assessment  RLE Assessment Active Range of Motion (AROM) Comments: hip flexion/abduction ROM limited by pain, knee flexion ROM limited by pain (90 deg) RLE Strength RLE Overall Strength: Deficits;Due to pain RLE Overall Strength Comments: assessed in supine Right Hip Flexion: 2/5 Right Hip ABduction: 2/5 Right Knee Flexion: 3-/5 Right Knee Extension: 3/5 Right Ankle Dorsiflexion: 4+/5 Right Ankle Plantar Flexion: 4+/5 LLE  Assessment LLE Assessment: Within Functional Limits  Today's Interventions  Treatment Session 1  Pt seated in WC upon arrival. Pt agreeable to therapy. Pt reports 7/10 R hip pain, premedicated (just prior to session). Therapist provided rest breaks and repositioning.   Pt very appreicative of ace bandage yesterday, pt reports unable to tolerate for long periods 2/2 itching but requesting to donning this AM for improved edema management.   Pt doffed leg rests and performed stand pivot transfer with mod I.   Therapist donned ace wrap to R LE toe to upper thigh for edema management with +2 A to help hold pt leg up.   Performed reassessed strength, ROM, and sensation, see above.   Pt supine in bed with all needs within reach and bed alarm on.   Treatment Session 2   Pt seated in WC upon arrival. Pt agreeable to therapy. Pt denies any pain.   Pt self propelled WC to main gym independently.   Pt performed step ups on 3 inch steps with B HR and CGA with therapist providing demonstration and verbal cues for technqiue for maintenance of Wbing precautions.   Pt performed 10 minutes on nu step with no resistance and use of B UE and L LE to assist R LE hip and knee flexion ROM, verbal cues provided for technique for maintenance of R LE 25% Wbing precautions.   Pt reports he plans to predominately use WC throughout the house, therpaist recommended pt ambulate lap inside house every hour for maintenance/progression of endurance. Therapist provided education for stand pivot transfer without AD to allow pt to transfer to surfaces from [facility] without pt wife needing to constantly reposition RW. Pt performed bed to chair and bed to reclienr with supervision progressing to mod I. Therpaist emphasized pt is not able to walk without RW for maintenance of RLE weight bearing precautions at this time. Pt verbalized understanding and agreeable.   Pt requesting to take ace wrap off 2/2 itching. Therpaist removed  ace wrap. Pt reports concerns regarding itching around bruising on inner thigh. Notified nurse, nurse present at end of session to address pt concerns.   Pt supine in bed at end of session with all needs within reach and bed alarm on.     [facility] [place], [place], DPT  11/16/2023, 8:37 AM

## 2023-11-16 NOTE — Progress Notes (Signed)
 Physical Therapy Session Note  Patient Details  Name: [name] Brown MRN: [id number] Date of Birth: 11/13/1950  Today's Date: 11/16/2023 PT Individual Time: 0947-1015 PT Individual Time Calculation (min): 28 min   Short Term Goals: Week 1:  PT Short Term Goal 1 (Week 1): Pt will perform sit to stand with RW and supervision PT Short Term Goal 2 (Week 1): Pt will perform stand pivot transfer with RW and supervision PT Short Term Goal 3 (Week 1): pt will ambulate 50 feet with RW and CGA  Skilled Therapeutic Interventions/Progress Updates: Pt presents supine in bed and agreeable to therapy.  Pt transferred sup to sit w/ mod I, hooking LLE under Right.  Pt transfers sit to stand w/ mod I and amb 150+ w/ RW supervision/mod I.  R knee improves flexion w/ increased gait distance.  Pt maintains 25% WB RLE.  Pt amb multiple trials including turns w/o c/o.  Pt returned to room and transferred to w/c w/ mod I.  Pt wheeling around room w/ w/c, all needs in reach.     Therapy Documentation Precautions:  Precautions Precautions: Fall Recall of Precautions/Restrictions: Intact Restrictions Weight Bearing Restrictions Per Provider Order: Yes RLE Weight Bearing Per Provider Order: Touchdown weight bearing RLE Partial Weight Bearing Percentage or Pounds: 25% until 3/7, then PWB 50% for 2 weeks, then WBAT General:   Vital Signs:   Pain: Pain Assessment Pain Scale: 0-10 Pain Score: 7  Mobility:   Locomotion :    Trunk/Postural Assessment : Cervical Assessment Cervical Assessment: Within Functional Limits Thoracic Assessment Thoracic Assessment: Within Functional Limits Lumbar Assessment Lumbar Assessment: Within Functional Limits Postural Control Postural Control: Within Functional Limits     Therapy/Group: Individual Therapy  Lucio Edward 11/16/2023, 10:23 AM

## 2023-11-16 NOTE — Progress Notes (Signed)
 PROGRESS NOTE   Subjective/Complaints: Patient looking forward to going home tomorrow.  Continues to report severe pain in his right leg however controlled with regular use of oxycodone.  ROS: Patient denies fever,  chills, abdominal pain, new vision changes, dizziness, nausea, vomiting, diarrhea,  shortness of breath or chest pain, headache  + Depression  + Constipation- improved + R leg pain and swelling- continued, controlled with medications   Objective:   No results found. Recent Labs    11/16/23 0548  WBC 5.6  HGB 10.9*  HCT 32.7*  PLT 393    Recent Labs    11/16/23 0548  NA 139  K 4.2  CL 107  CO2 24  GLUCOSE 106*  BUN 19  CREATININE 1.19  CALCIUM 8.9     Intake/Output Summary (Last 24 hours) at 11/16/2023 1250 Last data filed at 11/16/2023 1201 Gross per 24 hour  Intake 834 ml  Output 1545 ml  Net -711 ml        Physical Exam: Vital Signs Blood pressure 123/66, pulse 70, temperature (!) 97.5 F (36.4 C), resp. rate 18, height 5\' 6"  (1.676 m), weight 87.4 kg, SpO2 94%. Gen: no distress, normal appearing, laying in bed HEENT: oral mucosa pink and moist, NCAT Cardio: RRR, + murmur- chronic Chest: CTAB, normal effort, normal rate of breathing, on room air Abd: soft, non-distended, +BS- normoactive  Ext: RLE edema edema Psych: pleasant, normal affect Skin: Clean and intact without signs of breakdown Neuro:     Mental Status: He is alert and oriented to person, place, and time.     Comments: Intact to light touch in all 4 extremities  Musculoskeletal:    Cervical back: Neck supple. No tenderness.     Comments: 2 + edema right thigh, knee and foot.  Moving all 4 extremities sitting in chair  UE strength 5/5 in B/L Ue's  LLE- 5/5 in HF, KE, KF, DF and PF RLE HF 2+/5; KE/KF 3/5; DF/PF 5/5   Assessment/Plan: 1. Functional deficits which require 3+ hours per day of interdisciplinary therapy in a  comprehensive inpatient rehab setting. Physiatrist is providing close team supervision and 24 hour management of active medical problems listed below. Physiatrist and rehab team continue to assess barriers to discharge/monitor patient progress toward functional and medical goals  Care Tool:  Bathing    Body parts bathed by patient: Right arm, Left arm, Chest, Abdomen, Front perineal area, Buttocks, Right upper leg, Left upper leg, Face, Left lower leg, Right lower leg   Body parts bathed by helper: Right lower leg, Left lower leg     Bathing assist Assist Level: Independent with assistive device     Upper Body Dressing/Undressing Upper body dressing   What is the patient wearing?: Pull over shirt    Upper body assist Assist Level: Independent    Lower Body Dressing/Undressing Lower body dressing      What is the patient wearing?: Underwear/pull up, Pants     Lower body assist Assist for lower body dressing: Independent with assitive device     Toileting Toileting    Toileting assist Assist for toileting: Independent with assistive device     Transfers  Chair/bed transfer  Transfers assist     Chair/bed transfer assist level: Minimal Assistance - Patient > 75%     Locomotion Ambulation   Ambulation assist      Assist level: Supervision/Verbal cueing Assistive device: Walker-rolling Max distance: 150+   Walk 10 feet activity   Assist     Assist level: Supervision/Verbal cueing Assistive device: Walker-rolling   Walk 50 feet activity   Assist Walk 50 feet with 2 turns activity did not occur: Safety/medical concerns (pain/fatigue)  Assist level: Supervision/Verbal cueing Assistive device: Walker-rolling    Walk 150 feet activity   Assist Walk 150 feet activity did not occur: Safety/medical concerns  Assist level: Supervision/Verbal cueing Assistive device: Walker-rolling    Walk 10 feet on uneven surface  activity   Assist Walk 10  feet on uneven surfaces activity did not occur: Safety/medical concerns         Wheelchair     Assist Is the patient using a wheelchair?: Yes Type of Wheelchair: Manual    Wheelchair assist level: Supervision/Verbal cueing Max wheelchair distance: 150    Wheelchair 50 feet with 2 turns activity    Assist        Assist Level: Supervision/Verbal cueing   Wheelchair 150 feet activity     Assist      Assist Level: Supervision/Verbal cueing   Blood pressure 123/66, pulse 70, temperature (!) 97.5 F (36.4 C), resp. rate 18, height 5\' 6"  (1.676 m), weight 87.4 kg, SpO2 94%.  Medical Problem List and Plan: 1. Functional deficits secondary to R femur fx s/p IM nail- 25% PWB x 2 weeks, then 50 % for 2 weeks, then WBAT             -patient may  shower- cover incision             -ELOS/Goals: 7-10 days mod I             -continue CIR, evaluations today             -Team conference completed yesterday, expected discharge pending as he was pending evaluations             -2/27 bed level activity due to DVT             -2/28 DC bed level  -Possible DC end of this Week/Friday  -DC home today  -Plan to remove staples tomorrow before he goes home, checked with surgeon- ok to remove   2.  Antithrombotics: -DVT/anticoagulation:  Pharmaceutical: continue lovenox             -antiplatelet therapy: ASA daily-->increase to BID at discharge.   3. Pain Management:  Currently dilaudid 1 mg IV qid w/ oxycodone 5 mg qid. --continue oxycodone to 5-10 mg and premedicate prior to therapy- stop IV Dilaudid --ice prn. Flexeril prn for muscle spasms.  -Discussed would avoid NSAIDs, also increased bleeding risk due to Eliquis - reviewed today -Continue current dose of oxycodone PRN  4. Mood/Behavior/Sleep: Monitor sleep hygiene. Continue delirium precautions             --continue Repuip at nights for RLS. Trazodone prn for sleep.             -antipsychotic agents: N/A              2/27 scheduled for neuropsych due to report this afternoon for therapy of decreased mood.  Consider addition of SSRI  3/4 seen by neuropsychology, plan for outpatient therapy, behavioral health  3/4 Celexa 5mg  was started  3/6 Increase celexa to 10mg  5. Neuropsych/cognition: This patient is capable of making decisions on his own behalf. 6. Skin/Wound Care: Routine pressure relief measures.  7. Fluids/Electrolytes/Nutrition: Monitor I/O. Check CMET in am.  8. IM nail Right femur: 25% PWB thorough 03/07  --followed by 50% WB X 2 weeks then plan for WBAT  9. HTN: Monitor BP TID--continue cardizem  3/6 BP controlled    11/16/2023    5:32 AM 11/15/2023    6:19 PM 11/15/2023    2:38 PM  Vitals with BMI  Systolic 123 130 562  Diastolic 66 65 83  Pulse 70 77 84    10. Non-obstructive CAD w/recovered EF: continue Losartan, ASA and statin.   -Denies Chest pain 11.  Abnormal LFTs: Recheck labs in am             2/26 AST still elevated at 70, avoid hepatotoxic medication and monitor.  Change Tylenol to every 6 hours as needed from every 4 hours as needed.             -2/27 today patient indicated increased alcohol use to therapy that may be contributing to this             -3/3 lfts improved today  12. ABLA: Recheck CBC in am             -2/27 hemoglobin stable at 10.2             3/3 HGB stable  13. Constipation: Will schedule Senna with supper. Doesn't want Sorbitol              -2/26 LMB late last night, continue to monitor              2/27 LBM yesterday, continue to monitor             2/28 Mg citrate ordered this afternoon  3/1: enema ordered  3/6LBM large yesterday, continue to monitor  14. RLE edema- 2+ from R hip to R foot- monitor- might need dopplers             -2/27 DVT right proximal profunda, start Eliquis in bed level activity today             Continue eliquis, monitor RLE swelling- a little improved  -Continue compression to RLE        LOS: 9 days A FACE TO FACE  EVALUATION WAS PERFORMED  Fanny Dance 11/16/2023, 12:50 PM

## 2023-11-16 NOTE — Progress Notes (Signed)
 Inpatient Rehabilitation Care Coordinator Discharge Note   Patient Details  Name: [name] Brown MRN: [id number] Date of Birth: 24-Apr-1951   Discharge location: HOME WITH WIFE WHO IS ABLE TO PROVIDE SUPERVISION LEVEL  Length of Stay: 10 DAYS  Discharge activity level: MOD/I- LEVEL  Home/community participation: ACTIVE  Patient response QI:ONGEXB Literacy - How often do you need to have someone help you when you read instructions, pamphlets, or other written material from your doctor or pharmacy?: Never  Patient response MW:UXLKGM Isolation - How often do you feel lonely or isolated from those around you?: Never  Services provided included: MD, RD, PT, OT, RN, CM, Pharmacy, Neuropsych, SW  Financial Services:  Field seismologist Utilized: Medicare    Choices offered to/list presented to: PT  Follow-up services arranged:  Home Health, DME, Patient/Family has no preference for HH/DME agencies Home Health Agency: [facility]  PT  & OT    DME : [facility] AND 3 IN 1    Patient response to transportation need: Is the patient able to respond to transportation needs?: Yes In the past 12 months, has lack of transportation kept you from medical appointments or from getting medications?: No In the past 12 months, has lack of transportation kept you from meetings, work, or from getting things needed for daily living?: No   Patient/Family verbalized understanding of follow-up arrangements:  Yes  Individual responsible for coordination of the follow-up plan: [name] Brown [phone]  Confirmed correct DME delivered: [name] Brown 11/16/2023    Comments (or additional information): PT DID WELL AND WIFE WAS IN FOR EDUCATION AND FELT BETTER ONCE SHE SAW HIM MOVING AND DOING WELL.  Summary of Stay    Date/Time Discharge Planning CSW  11/15/23 0945 Home with wife who has been in for education and feels comfortable with care. Will arrange [facility] and have ordered  DME RGD  11/08/23 0939 new evaluation: Home with wife who has ankel issues from a past fracture and needs tendon surgery. She can only provide supervision level. Await team's evaluations RGD       Burhanuddin Kohlmann, Lemar Livings

## 2023-11-16 NOTE — Progress Notes (Signed)
 Occupational Therapy Session Note  Patient Details  Name: [name] Brown MRN: [id number] Date of Birth: November 01, 1950  Today's Date: 11/16/2023 OT Individual Time: 1324-4010 OT Individual Time Calculation (min): 70 min    Short Term Goals: Week 1:  OT Short Term Goal 1 (Week 1): Pt will perform toileting tasks with contact guard for balance OT Short Term Goal 2 (Week 1): Pt will thread LB clothing (underwear and pants) with superivsion with AE prn OT Short Term Goal 3 (Week 1): Pt will be given HEP for right Le to improve mobility for ADL performance  Skilled Therapeutic Interventions/Progress Updates:    OT intervention with focus on bathing at shower level, dressing with sit<>stand from wc, AE use, ACE wrapping, and discharging planning to prepare for d/c home tomorrow. Pt completed all BADLs and functional amb with RW at mod I. Pt renting hospital bed for one month for ease of bed mobility. Pt purchased shower seat for use at home. Pt preaticed use of sock aid and reacher. Pt has hip kit on order. Pt reported he feels prepared for discharge home tomorrow. Pt will be receiving HHOT at discharge. Pt remained in w/c with all needs within reach.   Therapy Documentation Precautions:  Precautions Precautions: Fall Recall of Precautions/Restrictions: Intact Restrictions Weight Bearing Restrictions Per Provider Order: Yes RLE Weight Bearing Per Provider Order: Touchdown weight bearing RLE Partial Weight Bearing Percentage or Pounds: 25% until 3/7, then PWB 50% for 2 weeks, then WBAT :   Pain: Pt reports 7/10 RLE pain; meds admin by nursing during therapy  Therapy/Group: Individual Therapy  Rich Brave 11/16/2023, 12:02 PM

## 2023-11-17 ENCOUNTER — Other Ambulatory Visit (HOSPITAL_COMMUNITY): Payer: Self-pay

## 2023-11-17 DIAGNOSIS — O2230 Deep phlebothrombosis in pregnancy, unspecified trimester: Secondary | ICD-10-CM | POA: Insufficient documentation

## 2023-11-17 DIAGNOSIS — G2581 Restless legs syndrome: Secondary | ICD-10-CM | POA: Insufficient documentation

## 2023-11-17 NOTE — Progress Notes (Signed)
 Inpatient Rehabilitation Discharge Medication Review by a Pharmacist  A complete drug regimen review was completed for this patient to identify any potential clinically significant medication issues.  High Risk Drug Classes Is patient taking? Indication by Medication  Antipsychotic No   Anticoagulant Yes Apixaban- RLE DVT  Antibiotic No   Opioid Yes Oxycodone - pain control  Antiplatelet Yes Aspirin- CAD  Hypoglycemics/insulin No   Vasoactive Medication Yes Diltiazem CD - hypertension  Chemotherapy No   Other Yes Acetaminophen- pain Citalopram - mood/ depression Calcium carbonate- indigestion or heartburn Cyclobenzaprine- muscle spasms Famotidine - GI prophylaxis Levothyroxine - hypothyroidism Pravastatin, ezetimibe - hyperlipidemia Ropinirole - restless leg Multivitamin, folate, thiamine - supplements Senna-docusate - laxative  Constipation Xiidra ophthalmic soln.-  dry eyes     Type of Medication Issue Identified Description of Issue Recommendation(s)  Drug Interaction(s) (clinically significant)     Duplicate Therapy     Allergy     No Medication Administration End Date     Incorrect Dose     Additional Drug Therapy Needed     Significant med changes from prior encounter (inform family/care partners about these prior to discharge). Losartan discontinued due to renal function and current blood pressure within normal/ stable.  Communicate to patient /family/ caregiver prior to discharge. It is noted to stop taking in the discharge AVS.   .    Other  .     Clinically significant medication issues were identified that warrant physician communication and completion of prescribed/recommended actions by midnight of the next day:  No  Pharmacist comments:   Time spent performing this drug regimen review (minutes):  20      Thank you for allowing pharmacy to be part of this patients care team  Noah Delaine, RPh Clinical Pharmacist 11/17/2023 8:13 AM

## 2023-11-17 NOTE — Progress Notes (Signed)
 Staples removed as ordered. Incisions CDI and open to air.  Mylo Red, LPN

## 2023-11-17 NOTE — Progress Notes (Signed)
 PROGRESS NOTE   Subjective/Complaints:  No acute events overnight. Remove staples today. He is looking forward to going home.    ROS: Patient denies fever,  chills, abdominal pain, new vision changes, dizziness, nausea, vomiting, diarrhea,  shortness of breath or chest pain, headache. No new motor or sensory changes.  + Depression  + Constipation- improved + R leg pain and swelling- continued, controlled with medications   Objective:   No results found. Recent Labs    11/16/23 0548  WBC 5.6  HGB 10.9*  HCT 32.7*  PLT 393    Recent Labs    11/16/23 0548  NA 139  K 4.2  CL 107  CO2 24  GLUCOSE 106*  BUN 19  CREATININE 1.19  CALCIUM 8.9     Intake/Output Summary (Last 24 hours) at 11/17/2023 1627 Last data filed at 11/17/2023 0727 Gross per 24 hour  Intake 698 ml  Output 1675 ml  Net -977 ml        Physical Exam: Vital Signs Blood pressure 128/63, pulse 71, temperature 98.4 F (36.9 C), temperature source Oral, resp. rate 18, height 5\' 6"  (1.676 m), weight 87.4 kg, SpO2 93%. Gen: no distress, normal appearing, laying in bed HEENT: oral mucosa pink and moist, NCAT Cardio: RRR, + murmur- chronic Chest: CTAB, normal effort, normal rate of breathing, on room air Abd: soft, non-distended, +BS- normoactive  Ext: RLE edema edema Psych: pleasant, normal affect Skin: Clean and intact without signs of breakdown Neuro:     Mental Status: He is alert and oriented to person, place, and time.     Comments: Intact to light touch in all 4 extremities  Musculoskeletal:    Cervical back: Neck supple. No tenderness.     Comments: 1+ edema right thigh, knee and foot- improved Moving all 4 extremities sitting in chair Incisions with staples in place   UE strength 5/5 in B/L Ue's  LLE- 5/5 in HF, KE, KF, DF and PF RLE HF 2+/5; KE/KF 3/5; DF/PF 5/5   Assessment/Plan: 1. Functional deficits which require 3+ hours per  day of interdisciplinary therapy in a comprehensive inpatient rehab setting. Physiatrist is providing close team supervision and 24 hour management of active medical problems listed below. Physiatrist and rehab team continue to assess barriers to discharge/monitor patient progress toward functional and medical goals  Care Tool:  Bathing    Body parts bathed by patient: Right arm, Left arm, Chest, Abdomen, Front perineal area, Buttocks, Right upper leg, Left upper leg, Face, Left lower leg, Right lower leg   Body parts bathed by helper: Right lower leg, Left lower leg     Bathing assist Assist Level: Independent with assistive device     Upper Body Dressing/Undressing Upper body dressing   What is the patient wearing?: Pull over shirt    Upper body assist Assist Level: Independent    Lower Body Dressing/Undressing Lower body dressing      What is the patient wearing?: Underwear/pull up, Pants     Lower body assist Assist for lower body dressing: Independent with assitive device     Toileting Toileting    Toileting assist Assist for toileting: Independent with assistive  device     Transfers Chair/bed transfer  Transfers assist     Chair/bed transfer assist level: Independent with assistive device     Locomotion Ambulation   Ambulation assist      Assist level: Independent with assistive device Assistive device: Walker-rolling Max distance: 150+   Walk 10 feet activity   Assist     Assist level: Independent with assistive device Assistive device: Walker-rolling   Walk 50 feet activity   Assist Walk 50 feet with 2 turns activity did not occur: Safety/medical concerns (pain/fatigue)  Assist level: Independent with assistive device Assistive device: Walker-rolling    Walk 150 feet activity   Assist Walk 150 feet activity did not occur: Safety/medical concerns  Assist level: Independent with assistive device Assistive device:  Walker-rolling    Walk 10 feet on uneven surface  activity   Assist Walk 10 feet on uneven surfaces activity did not occur: Safety/medical concerns   Assist level: Independent with assistive device Assistive device: Walker-rolling   Wheelchair     Assist Is the patient using a wheelchair?: Yes Type of Wheelchair: Manual    Wheelchair assist level: Independent Max wheelchair distance: 150    Wheelchair 50 feet with 2 turns activity    Assist        Assist Level: Independent   Wheelchair 150 feet activity     Assist      Assist Level: Independent   Blood pressure 128/63, pulse 71, temperature 98.4 F (36.9 C), temperature source Oral, resp. rate 18, height 5\' 6"  (1.676 m), weight 87.4 kg, SpO2 93%.  Medical Problem List and Plan: 1. Functional deficits secondary to R femur fx s/p IM nail- 25% PWB x 2 weeks, then 50 % for 2 weeks, then WBAT             -patient may  shower- cover incision             -ELOS/Goals: 7-10 days mod I             -continue CIR, evaluations today             -Team conference completed yesterday, expected discharge pending as he was pending evaluations             -2/27 bed level activity due to DVT             -2/28 DC bed level  -DC home today  -Remove staples today-order placed  2.  Antithrombotics: -DVT/anticoagulation:  Pharmaceutical: continue lovenox             -antiplatelet therapy: ASA daily-->increase to BID at discharge.   3. Pain Management:  Currently dilaudid 1 mg IV qid w/ oxycodone 5 mg qid. --continue oxycodone to 5-10 mg and premedicate prior to therapy- stop IV Dilaudid --ice prn. Flexeril prn for muscle spasms.  -Discussed would avoid NSAIDs, also increased bleeding risk due to Eliquis - reviewed today -Continue current dose of oxycodone PRN  4. Mood/Behavior/Sleep: Monitor sleep hygiene. Continue delirium precautions             --continue Repuip at nights for RLS. Trazodone prn for sleep.              -antipsychotic agents: N/A             2/27 scheduled for neuropsych due to report this afternoon for therapy of decreased mood.  Consider addition of SSRI  3/4 seen by neuropsychology, plan for outpatient therapy, behavioral health  3/4 Celexa  5mg  was started  3/6 Increase celexa to 10mg  5. Neuropsych/cognition: This patient is capable of making decisions on his own behalf. 6. Skin/Wound Care: Routine pressure relief measures.  7. Fluids/Electrolytes/Nutrition: Monitor I/O. Check CMET in am.  8. IM nail Right femur: 25% PWB thorough 03/07  --followed by 50% WB X 2 weeks then plan for WBAT-reviewed with patient today  9. HTN: Monitor BP TID--continue cardizem  3/7 BP controlled, f/u PCP    11/17/2023    3:09 AM 11/16/2023    6:31 PM 11/16/2023    2:18 PM  Vitals with BMI  Systolic 128 121 542  Diastolic 63 74 79  Pulse 71 81 77    10. Non-obstructive CAD w/recovered EF: continue Losartan, ASA and statin.   -Denies Chest pain 11.  Abnormal LFTs: Recheck labs in am             2/26 AST still elevated at 70, avoid hepatotoxic medication and monitor.  Change Tylenol to every 6 hours as needed from every 4 hours as needed.             -2/27 today patient indicated increased alcohol use to therapy that may be contributing to this             -3/3 lfts improved today  Recheck PCP  12. ABLA: Recheck CBC in am             -2/27 hemoglobin stable at 10.2             3/3 HGB stable  Recommend Recheck PCP  13. Constipation: Will schedule Senna with supper. Doesn't want Sorbitol              -2/26 LMB late last night, continue to monitor              2/27 LBM yesterday, continue to monitor             2/28 Mg citrate ordered this afternoon  3/1: enema ordered  3/6LBM large yesterday, continue to monitor  LBM yesterday  14. RLE edema- 2+ from R hip to R foot- monitor- might need dopplers             -2/27 DVT right proximal profunda, start Eliquis in bed level activity today              Continue eliquis, monitor RLE swelling- a little improved  -Continue compression to RLE  -Edema improved today- f/u with PCP for continued monitoring. Continue eliquis         LOS: 10 days A FACE TO FACE EVALUATION WAS PERFORMED  Fanny Dance 11/17/2023, 4:27 PM

## 2023-11-22 ENCOUNTER — Other Ambulatory Visit: Payer: Self-pay | Admitting: Cardiovascular Disease

## 2023-11-22 DIAGNOSIS — Z952 Presence of prosthetic heart valve: Secondary | ICD-10-CM | POA: Diagnosis not present

## 2023-11-22 DIAGNOSIS — I10 Essential (primary) hypertension: Secondary | ICD-10-CM | POA: Diagnosis not present

## 2023-11-22 DIAGNOSIS — E785 Hyperlipidemia, unspecified: Secondary | ICD-10-CM | POA: Diagnosis not present

## 2023-11-22 DIAGNOSIS — E039 Hypothyroidism, unspecified: Secondary | ICD-10-CM | POA: Diagnosis not present

## 2023-11-22 DIAGNOSIS — Z7901 Long term (current) use of anticoagulants: Secondary | ICD-10-CM | POA: Diagnosis not present

## 2023-11-22 DIAGNOSIS — K219 Gastro-esophageal reflux disease without esophagitis: Secondary | ICD-10-CM | POA: Diagnosis not present

## 2023-11-22 DIAGNOSIS — H9319 Tinnitus, unspecified ear: Secondary | ICD-10-CM | POA: Diagnosis not present

## 2023-11-22 DIAGNOSIS — I2510 Atherosclerotic heart disease of native coronary artery without angina pectoris: Secondary | ICD-10-CM | POA: Diagnosis not present

## 2023-11-22 DIAGNOSIS — Z6829 Body mass index (BMI) 29.0-29.9, adult: Secondary | ICD-10-CM | POA: Diagnosis not present

## 2023-11-22 DIAGNOSIS — I480 Paroxysmal atrial fibrillation: Secondary | ICD-10-CM | POA: Diagnosis not present

## 2023-11-22 DIAGNOSIS — Z85038 Personal history of other malignant neoplasm of large intestine: Secondary | ICD-10-CM | POA: Diagnosis not present

## 2023-11-22 DIAGNOSIS — F32A Depression, unspecified: Secondary | ICD-10-CM | POA: Diagnosis not present

## 2023-11-22 DIAGNOSIS — G4700 Insomnia, unspecified: Secondary | ICD-10-CM | POA: Diagnosis not present

## 2023-11-22 DIAGNOSIS — I7121 Aneurysm of the ascending aorta, without rupture: Secondary | ICD-10-CM | POA: Diagnosis not present

## 2023-11-22 DIAGNOSIS — I82411 Acute embolism and thrombosis of right femoral vein: Secondary | ICD-10-CM | POA: Diagnosis not present

## 2023-11-22 DIAGNOSIS — S7291XD Unspecified fracture of right femur, subsequent encounter for closed fracture with routine healing: Secondary | ICD-10-CM | POA: Diagnosis not present

## 2023-11-22 DIAGNOSIS — D62 Acute posthemorrhagic anemia: Secondary | ICD-10-CM | POA: Diagnosis not present

## 2023-11-22 DIAGNOSIS — G2581 Restless legs syndrome: Secondary | ICD-10-CM | POA: Diagnosis not present

## 2023-11-22 DIAGNOSIS — H04123 Dry eye syndrome of bilateral lacrimal glands: Secondary | ICD-10-CM | POA: Diagnosis not present

## 2023-11-22 DIAGNOSIS — I493 Ventricular premature depolarization: Secondary | ICD-10-CM | POA: Diagnosis not present

## 2023-11-22 DIAGNOSIS — E669 Obesity, unspecified: Secondary | ICD-10-CM | POA: Diagnosis not present

## 2023-11-22 DIAGNOSIS — S71001D Unspecified open wound, right hip, subsequent encounter: Secondary | ICD-10-CM | POA: Diagnosis not present

## 2023-11-22 DIAGNOSIS — K429 Umbilical hernia without obstruction or gangrene: Secondary | ICD-10-CM | POA: Diagnosis not present

## 2023-11-22 DIAGNOSIS — G4733 Obstructive sleep apnea (adult) (pediatric): Secondary | ICD-10-CM | POA: Diagnosis not present

## 2023-11-22 DIAGNOSIS — Z7982 Long term (current) use of aspirin: Secondary | ICD-10-CM | POA: Diagnosis not present

## 2023-11-23 DIAGNOSIS — S7221XA Displaced subtrochanteric fracture of right femur, initial encounter for closed fracture: Secondary | ICD-10-CM | POA: Diagnosis not present

## 2023-11-24 ENCOUNTER — Telehealth: Payer: Self-pay | Admitting: Family Medicine

## 2023-11-24 DIAGNOSIS — S7291XD Unspecified fracture of right femur, subsequent encounter for closed fracture with routine healing: Secondary | ICD-10-CM | POA: Diagnosis not present

## 2023-11-24 DIAGNOSIS — I82411 Acute embolism and thrombosis of right femoral vein: Secondary | ICD-10-CM | POA: Diagnosis not present

## 2023-11-24 DIAGNOSIS — I480 Paroxysmal atrial fibrillation: Secondary | ICD-10-CM | POA: Diagnosis not present

## 2023-11-24 DIAGNOSIS — I10 Essential (primary) hypertension: Secondary | ICD-10-CM | POA: Diagnosis not present

## 2023-11-24 DIAGNOSIS — S71001D Unspecified open wound, right hip, subsequent encounter: Secondary | ICD-10-CM | POA: Diagnosis not present

## 2023-11-24 DIAGNOSIS — D62 Acute posthemorrhagic anemia: Secondary | ICD-10-CM | POA: Diagnosis not present

## 2023-11-24 NOTE — Telephone Encounter (Signed)
 Copied from CRM [id number]. Topic: General - Other >> Nov 24, 2023  3:34 PM [name] wrote: Reason for CRM: Patient had evaluation and therapist states he does not need OT services [name] Brown [phone]

## 2023-11-27 DIAGNOSIS — S7291XD Unspecified fracture of right femur, subsequent encounter for closed fracture with routine healing: Secondary | ICD-10-CM | POA: Diagnosis not present

## 2023-11-27 DIAGNOSIS — I10 Essential (primary) hypertension: Secondary | ICD-10-CM | POA: Diagnosis not present

## 2023-11-27 DIAGNOSIS — D62 Acute posthemorrhagic anemia: Secondary | ICD-10-CM | POA: Diagnosis not present

## 2023-11-27 DIAGNOSIS — I82411 Acute embolism and thrombosis of right femoral vein: Secondary | ICD-10-CM | POA: Diagnosis not present

## 2023-11-27 DIAGNOSIS — I480 Paroxysmal atrial fibrillation: Secondary | ICD-10-CM | POA: Diagnosis not present

## 2023-11-27 DIAGNOSIS — S71001D Unspecified open wound, right hip, subsequent encounter: Secondary | ICD-10-CM | POA: Diagnosis not present

## 2023-11-27 NOTE — Telephone Encounter (Signed)
 FYI

## 2023-11-28 ENCOUNTER — Other Ambulatory Visit: Payer: Self-pay | Admitting: Cardiovascular Disease

## 2023-11-28 ENCOUNTER — Telehealth: Payer: Self-pay | Admitting: Cardiovascular Disease

## 2023-11-28 MED ORDER — PRAVASTATIN SODIUM 20 MG PO TABS: 20.0000 mg | ORAL_TABLET | Freq: Every day | ORAL | 1 refills | Status: DC

## 2023-11-28 NOTE — Telephone Encounter (Signed)
 Pt c/o medication issue:  1. Name of Medication: losartan (COZAAR) 100 MG tablet [[id number]]   2. How are you currently taking this medication (dosage and times per day)? As written   3. Are you having a reaction (difficulty breathing--STAT)?   4. What is your medication issue? Pt called in asking for a refill for this med. It is not longer on med list, discontinued at hospital but he confirmed he is still taking. Please advise.    [facility] [id number] - [place], [place] - [address] Phone: [phone]  Fax: [phone]

## 2023-11-28 NOTE — Telephone Encounter (Signed)
*  STAT* If patient is at the pharmacy, call can be transferred to refill team.   1. Which medications need to be refilled? (please list name of each medication and dose if known)   pravastatin (PRAVACHOL) 20 MG tablet     4. Which pharmacy/location (including street and city if local pharmacy) is medication to be sent to? [id number] - [place], [place] - [address] Phone: [phone]  Fax: [phone]        5. Do they need a 30 day or 90 day supply? 90

## 2023-11-28 NOTE — Telephone Encounter (Signed)
 Opened in error. Please disregard.

## 2023-11-29 ENCOUNTER — Encounter (HOSPITAL_BASED_OUTPATIENT_CLINIC_OR_DEPARTMENT_OTHER): Payer: Self-pay | Admitting: Cardiovascular Disease

## 2023-11-29 ENCOUNTER — Telehealth: Payer: Self-pay | Admitting: Family Medicine

## 2023-11-29 NOTE — Telephone Encounter (Signed)
 Refill sent.

## 2023-11-29 NOTE — Telephone Encounter (Signed)
 Home Health Certification or Plan of Care Tracking  Is this a Certification or Plan of Care? Plan of Care  [facility] Agency: [facility]  Order Number:  [id number]  Has charge sheet been attached? Yes  Where has form been placed:   Labeled & placed in provider bin

## 2023-11-29 NOTE — Telephone Encounter (Signed)
 Placed in folder at nurse station

## 2023-11-30 ENCOUNTER — Ambulatory Visit (INDEPENDENT_AMBULATORY_CARE_PROVIDER_SITE_OTHER): Payer: Medicare Other | Admitting: Family Medicine

## 2023-11-30 ENCOUNTER — Encounter: Payer: Self-pay | Admitting: Family Medicine

## 2023-11-30 VITALS — BP 140/70 | HR 75 | Temp 97.5°F | Ht 66.0 in | Wt 179.5 lb

## 2023-11-30 DIAGNOSIS — I480 Paroxysmal atrial fibrillation: Secondary | ICD-10-CM | POA: Diagnosis not present

## 2023-11-30 DIAGNOSIS — I10 Essential (primary) hypertension: Secondary | ICD-10-CM | POA: Diagnosis not present

## 2023-11-30 DIAGNOSIS — I82411 Acute embolism and thrombosis of right femoral vein: Secondary | ICD-10-CM | POA: Diagnosis not present

## 2023-11-30 DIAGNOSIS — F341 Dysthymic disorder: Secondary | ICD-10-CM

## 2023-11-30 DIAGNOSIS — I824Y1 Acute embolism and thrombosis of unspecified deep veins of right proximal lower extremity: Secondary | ICD-10-CM | POA: Diagnosis not present

## 2023-11-30 DIAGNOSIS — S72001S Fracture of unspecified part of neck of right femur, sequela: Secondary | ICD-10-CM

## 2023-11-30 DIAGNOSIS — D62 Acute posthemorrhagic anemia: Secondary | ICD-10-CM | POA: Diagnosis not present

## 2023-11-30 DIAGNOSIS — S7291XD Unspecified fracture of right femur, subsequent encounter for closed fracture with routine healing: Secondary | ICD-10-CM | POA: Diagnosis not present

## 2023-11-30 DIAGNOSIS — S71001D Unspecified open wound, right hip, subsequent encounter: Secondary | ICD-10-CM | POA: Diagnosis not present

## 2023-11-30 MED ORDER — CITALOPRAM HYDROBROMIDE 20 MG PO TABS: 20.0000 mg | ORAL_TABLET | Freq: Every day | ORAL | 3 refills | Status: DC

## 2023-11-30 MED ORDER — TRAMADOL HCL 50 MG PO TABS: 50.0000 mg | ORAL_TABLET | Freq: Three times a day (TID) | ORAL | 0 refills | Status: DC | PRN

## 2023-11-30 MED ORDER — LOSARTAN POTASSIUM 100 MG PO TABS: 100.0000 mg | ORAL_TABLET | Freq: Every day | ORAL | 3 refills | Status: AC

## 2023-11-30 NOTE — Patient Instructions (Addendum)
 Follow up in 4-6 weeks to recheck mood INCREASE the Citalopram to 20mg  daily- 2 of what you have at home and 1 of the new prescription Keep up the good work on your PT- you're doing great! Take the Tramadol as needed for pain CONTINUE the Eliquis Call with any questions or concerns Hang in there!!!

## 2023-11-30 NOTE — Progress Notes (Signed)
   Subjective:    Patient ID: [name] Brown, male    DOB: 17-Dec-1950, 73 y.o.   MRN: [id number]  HPI Hospital f/u- pt was admitted 2/21 after a fall at home, landing on R hip.  He had a R femur fracture and had IM nailing.  He was inpt until 2/25 when he was transferred to inpt rehab to have extensive PT/OT.  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  He was started on Eliquis.  He had 2+ swelling of R thigh and knee and was told to work on elevation and compression.  Staples were removed the day of d/c (3/7).  While inpt, he was switched from Fluoxetine to Citalopram 10mg  daily.  Admits to being down after this setback.  He was sent home on Oxycodone.  Is doing HH PT.  Pt reports feeling better but having constant ache of R hip/thigh.  Continues to have swelling of thigh.   Review of Systems For ROS see HPI     Objective:   Physical Exam Vitals reviewed.  Constitutional:      General: He is not in acute distress.    Appearance: Normal appearance. He is not ill-appearing.  HENT:     Head: Normocephalic and atraumatic.  Eyes:     Extraocular Movements: Extraocular movements intact.     Conjunctiva/sclera: Conjunctivae normal.  Cardiovascular:     Rate and Rhythm: Normal rate and regular rhythm.  Pulmonary:     Effort: Pulmonary effort is normal. No respiratory distress.  Musculoskeletal:     Cervical back: Neck supple.     Right lower leg: Edema present.     Left lower leg: No edema.  Skin:    General: Skin is warm and dry.  Neurological:     General: No focal deficit present.     Mental Status: He is alert and oriented to person, place, and time.     Gait: Gait abnormal (ambulating w/ walker).  Psychiatric:        Mood and Affect: Mood normal.        Behavior: Behavior normal.           Assessment & Plan:

## 2023-12-01 DIAGNOSIS — S71001D Unspecified open wound, right hip, subsequent encounter: Secondary | ICD-10-CM | POA: Diagnosis not present

## 2023-12-01 DIAGNOSIS — D62 Acute posthemorrhagic anemia: Secondary | ICD-10-CM | POA: Diagnosis not present

## 2023-12-01 DIAGNOSIS — I480 Paroxysmal atrial fibrillation: Secondary | ICD-10-CM | POA: Diagnosis not present

## 2023-12-01 DIAGNOSIS — S7291XD Unspecified fracture of right femur, subsequent encounter for closed fracture with routine healing: Secondary | ICD-10-CM | POA: Diagnosis not present

## 2023-12-01 DIAGNOSIS — I10 Essential (primary) hypertension: Secondary | ICD-10-CM | POA: Diagnosis not present

## 2023-12-01 DIAGNOSIS — I82411 Acute embolism and thrombosis of right femoral vein: Secondary | ICD-10-CM | POA: Diagnosis not present

## 2023-12-03 DIAGNOSIS — I82409 Acute embolism and thrombosis of unspecified deep veins of unspecified lower extremity: Secondary | ICD-10-CM | POA: Insufficient documentation

## 2023-12-03 NOTE — Assessment & Plan Note (Signed)
 Pt reports ongoing ache of R thigh.  He is doing PT multiple times weekly and doing remarkably well.  Will provide Tramadol to use PRN for severe pain- otherwise will continue Tylenol.  Pt expressed understanding and is in agreement w/ plan.

## 2023-12-03 NOTE — Assessment & Plan Note (Signed)
 Noted at last visit w/ me.  He never started the Fluoxetine as he fell the day of our last appt.  The hospital team switched him from Fluoxetine to Citalopram 10mg  daily.  He admits he is down and overwhelmed w/ what happened.  Will increase to 20mg  daily.  Pt expressed understanding and is in agreement w/ plan.

## 2023-12-03 NOTE — Assessment & Plan Note (Signed)
 New.  Noted after R leg became increasingly swollen.  Found to have R proximal profunda DVT.  Started on Eliquis.  At this point, it is unclear if he is following w/ Cards for DVT and Eliquis or if I will be managing this.  We have time to figure this out as he will need to be on anticoagulation for 6 months (provoked DVT).  Pt expressed understanding and is in agreement w/ plan.

## 2023-12-04 DIAGNOSIS — S7291XD Unspecified fracture of right femur, subsequent encounter for closed fracture with routine healing: Secondary | ICD-10-CM | POA: Diagnosis not present

## 2023-12-04 DIAGNOSIS — S71001D Unspecified open wound, right hip, subsequent encounter: Secondary | ICD-10-CM | POA: Diagnosis not present

## 2023-12-04 DIAGNOSIS — I82411 Acute embolism and thrombosis of right femoral vein: Secondary | ICD-10-CM | POA: Diagnosis not present

## 2023-12-04 DIAGNOSIS — D62 Acute posthemorrhagic anemia: Secondary | ICD-10-CM | POA: Diagnosis not present

## 2023-12-04 DIAGNOSIS — I10 Essential (primary) hypertension: Secondary | ICD-10-CM | POA: Diagnosis not present

## 2023-12-04 DIAGNOSIS — I480 Paroxysmal atrial fibrillation: Secondary | ICD-10-CM | POA: Diagnosis not present

## 2023-12-06 NOTE — Telephone Encounter (Signed)
 Request to increase tramadol as discussed.   Requested Prescriptions   Pending Prescriptions Disp Refills   traMADol (ULTRAM-ER) 100 MG 24 hr tablet      Sig: Take 1 tablet (100 mg total) by mouth daily.     Date of patient request: 12/06/2023 Last office visit: 11/02/2023 Upcoming visit: 11/24/2023 Date of last refill: 11/30/2023 Last refill amount: 21

## 2023-12-07 DIAGNOSIS — S71001D Unspecified open wound, right hip, subsequent encounter: Secondary | ICD-10-CM | POA: Diagnosis not present

## 2023-12-07 DIAGNOSIS — I480 Paroxysmal atrial fibrillation: Secondary | ICD-10-CM | POA: Diagnosis not present

## 2023-12-07 DIAGNOSIS — D62 Acute posthemorrhagic anemia: Secondary | ICD-10-CM | POA: Diagnosis not present

## 2023-12-07 DIAGNOSIS — I82411 Acute embolism and thrombosis of right femoral vein: Secondary | ICD-10-CM | POA: Diagnosis not present

## 2023-12-07 DIAGNOSIS — I10 Essential (primary) hypertension: Secondary | ICD-10-CM | POA: Diagnosis not present

## 2023-12-07 DIAGNOSIS — S7291XD Unspecified fracture of right femur, subsequent encounter for closed fracture with routine healing: Secondary | ICD-10-CM | POA: Diagnosis not present

## 2023-12-07 MED ORDER — LEVOTHYROXINE SODIUM 100 MCG PO TABS: 100.0000 ug | ORAL_TABLET | Freq: Every day | ORAL | 1 refills | Status: DC

## 2023-12-07 MED ORDER — TRAMADOL HCL 50 MG PO TABS: 50.0000 mg | ORAL_TABLET | Freq: Three times a day (TID) | ORAL | 0 refills | Status: DC | PRN

## 2023-12-11 ENCOUNTER — Telehealth: Payer: Self-pay | Admitting: Family Medicine

## 2023-12-11 DIAGNOSIS — K219 Gastro-esophageal reflux disease without esophagitis: Secondary | ICD-10-CM | POA: Diagnosis not present

## 2023-12-11 DIAGNOSIS — I7121 Aneurysm of the ascending aorta, without rupture: Secondary | ICD-10-CM | POA: Diagnosis not present

## 2023-12-11 DIAGNOSIS — I480 Paroxysmal atrial fibrillation: Secondary | ICD-10-CM | POA: Diagnosis not present

## 2023-12-11 DIAGNOSIS — I82411 Acute embolism and thrombosis of right femoral vein: Secondary | ICD-10-CM | POA: Diagnosis not present

## 2023-12-11 DIAGNOSIS — D62 Acute posthemorrhagic anemia: Secondary | ICD-10-CM | POA: Diagnosis not present

## 2023-12-11 DIAGNOSIS — S7291XD Unspecified fracture of right femur, subsequent encounter for closed fracture with routine healing: Secondary | ICD-10-CM | POA: Diagnosis not present

## 2023-12-11 DIAGNOSIS — H04123 Dry eye syndrome of bilateral lacrimal glands: Secondary | ICD-10-CM | POA: Diagnosis not present

## 2023-12-11 DIAGNOSIS — S71001D Unspecified open wound, right hip, subsequent encounter: Secondary | ICD-10-CM | POA: Diagnosis not present

## 2023-12-11 DIAGNOSIS — G4700 Insomnia, unspecified: Secondary | ICD-10-CM | POA: Diagnosis not present

## 2023-12-11 DIAGNOSIS — I10 Essential (primary) hypertension: Secondary | ICD-10-CM | POA: Diagnosis not present

## 2023-12-11 DIAGNOSIS — E039 Hypothyroidism, unspecified: Secondary | ICD-10-CM | POA: Diagnosis not present

## 2023-12-11 DIAGNOSIS — I2510 Atherosclerotic heart disease of native coronary artery without angina pectoris: Secondary | ICD-10-CM | POA: Diagnosis not present

## 2023-12-11 NOTE — Telephone Encounter (Signed)
 Home Health Certification or Plan of Care Tracking  Is this a Certification or Plan of Care? Plan of Care  [facility] Agency: [facility]  Order Number:  [id number]  Has charge sheet been attached? Yes  Where has form been placed:   Labeled & placed in provider bin

## 2023-12-11 NOTE — Telephone Encounter (Signed)
 Faxed and placed in scan bin

## 2023-12-11 NOTE — Telephone Encounter (Signed)
 Form completed and returned to [place]

## 2023-12-11 NOTE — Telephone Encounter (Signed)
 Placed in folder at nurse station

## 2023-12-12 ENCOUNTER — Ambulatory Visit (INDEPENDENT_AMBULATORY_CARE_PROVIDER_SITE_OTHER): Admitting: *Deleted

## 2023-12-12 DIAGNOSIS — Z0000 Encounter for general adult medical examination without abnormal findings: Secondary | ICD-10-CM | POA: Diagnosis not present

## 2023-12-12 NOTE — Patient Instructions (Signed)
 [name] Brown , Thank you for taking time to come for your Medicare Wellness Visit. I appreciate your ongoing commitment to your health goals. Please review the following plan we discussed and let me know if I can assist you in the future.   Screening recommendations/referrals: Colonoscopy: up to date Recommended yearly ophthalmology/optometry visit for glaucoma screening and checkup Recommended yearly dental visit for hygiene and checkup  Vaccinations: Influenza vaccine: up to date Pneumococcal vaccine: up to date Tdap vaccine: up to date Shingles vaccine: Education provided    Advanced directives: yes    Preventive Care 74 Years and Older, Male Preventive care refers to lifestyle choices and visits with your health care provider that can promote health and wellness. What does preventive care include? A yearly physical exam. This is also called an annual well check. Dental exams once or twice a year. Routine eye exams. Ask your health care provider how often you should have your eyes checked. Personal lifestyle choices, including: Daily care of your teeth and gums. Regular physical activity. Eating a healthy diet. Avoiding tobacco and drug use. Limiting alcohol use. Practicing safe sex. Taking low doses of aspirin every day. Taking vitamin and mineral supplements as recommended by your health care provider. What happens during an annual well check? The services and screenings done by your health care provider during your annual well check will depend on your age, overall health, lifestyle risk factors, and family history of disease. Counseling  Your health care provider may ask you questions about your: Alcohol use. Tobacco use. Drug use. Emotional well-being. Home and relationship well-being. Sexual activity. Eating habits. History of falls. Memory and ability to understand (cognition). Work and work Astronomer. Screening  You may have the following tests or  measurements: Height, weight, and BMI. Blood pressure. Lipid and cholesterol levels. These may be checked every 5 years, or more frequently if you are over 5 years old. Skin check. Lung cancer screening. You may have this screening every year starting at age 74 if you have a 30-pack-year history of smoking and currently smoke or have quit within the past 15 years. Fecal occult blood test (FOBT) of the stool. You may have this test every year starting at age 74. Flexible sigmoidoscopy or colonoscopy. You may have a sigmoidoscopy every 5 years or a colonoscopy every 10 years starting at age 74. Prostate cancer screening. Recommendations will vary depending on your family history and other risks. Hepatitis C blood test. Hepatitis B blood test. Sexually transmitted disease (STD) testing. Diabetes screening. This is done by checking your blood sugar (glucose) after you have not eaten for a while (fasting). You may have this done every 1-3 years. Abdominal aortic aneurysm (AAA) screening. You may need this if you are a current or former smoker. Osteoporosis. You may be screened starting at age 74 if you are at high risk. Talk with your health care provider about your test results, treatment options, and if necessary, the need for more tests. Vaccines  Your health care provider may recommend certain vaccines, such as: Influenza vaccine. This is recommended every year. Tetanus, diphtheria, and acellular pertussis (Tdap, Td) vaccine. You may need a Td booster every 10 years. Zoster vaccine. You may need this after age 48. Pneumococcal 13-valent conjugate (PCV13) vaccine. One dose is recommended after age 70. Pneumococcal polysaccharide (PPSV23) vaccine. One dose is recommended after age 48. Talk to your health care provider about which screenings and vaccines you need and how often you need them. This information  is not intended to replace advice given to you by your health care provider. Make sure  you discuss any questions you have with your health care provider. Document Released: 09/25/2015 Document Revised: 05/18/2016 Document Reviewed: 06/30/2015 Elsevier Interactive Patient Education  2017 [employer].  Fall Prevention in the Home Falls can cause injuries. They can happen to people of all ages. There are many things you can do to make your home safe and to help prevent falls. What can I do on the outside of my home? Regularly fix the edges of walkways and driveways and fix any cracks. Remove anything that might make you trip as you walk through a door, such as a raised step or threshold. Trim any bushes or trees on the path to your home. Use bright outdoor lighting. Clear any walking paths of anything that might make someone trip, such as rocks or tools. Regularly check to see if handrails are loose or broken. Make sure that both sides of any steps have handrails. Any raised decks and porches should have guardrails on the edges. Have any leaves, snow, or ice cleared regularly. Use sand or salt on walking paths during winter. Clean up any spills in your garage right away. This includes oil or grease spills. What can I do in the bathroom? Use night lights. Install grab bars by the toilet and in the tub and shower. Do not use towel bars as grab bars. Use non-skid mats or decals in the tub or shower. If you need to sit down in the shower, use a plastic, non-slip stool. Keep the floor dry. Clean up any water that spills on the floor as soon as it happens. Remove soap buildup in the tub or shower regularly. Attach bath mats securely with double-sided non-slip rug tape. Do not have throw rugs and other things on the floor that can make you trip. What can I do in the bedroom? Use night lights. Make sure that you have a light by your bed that is easy to reach. Do not use any sheets or blankets that are too big for your bed. They should not hang down onto the floor. Have a firm  chair that has side arms. You can use this for support while you get dressed. Do not have throw rugs and other things on the floor that can make you trip. What can I do in the kitchen? Clean up any spills right away. Avoid walking on wet floors. Keep items that you use a lot in easy-to-reach places. If you need to reach something above you, use a strong step stool that has a grab bar. Keep electrical cords out of the way. Do not use floor polish or wax that makes floors slippery. If you must use wax, use non-skid floor wax. Do not have throw rugs and other things on the floor that can make you trip. What can I do with my stairs? Do not leave any items on the stairs. Make sure that there are handrails on both sides of the stairs and use them. Fix handrails that are broken or loose. Make sure that handrails are as long as the stairways. Check any carpeting to make sure that it is firmly attached to the stairs. Fix any carpet that is loose or worn. Avoid having throw rugs at the top or bottom of the stairs. If you do have throw rugs, attach them to the floor with carpet tape. Make sure that you have a light switch at the top of  the stairs and the bottom of the stairs. If you do not have them, ask someone to add them for you. What else can I do to help prevent falls? Wear shoes that: Do not have high heels. Have rubber bottoms. Are comfortable and fit you well. Are closed at the toe. Do not wear sandals. If you use a stepladder: Make sure that it is fully opened. Do not climb a closed stepladder. Make sure that both sides of the stepladder are locked into place. Ask someone to hold it for you, if possible. Clearly mark and make sure that you can see: Any grab bars or handrails. First and last steps. Where the edge of each step is. Use tools that help you move around (mobility aids) if they are needed. These include: Canes. Walkers. Scooters. Crutches. Turn on the lights when you go  into a dark area. Replace any light bulbs as soon as they burn out. Set up your furniture so you have a clear path. Avoid moving your furniture around. If any of your floors are uneven, fix them. If there are any pets around you, be aware of where they are. Review your medicines with your doctor. Some medicines can make you feel dizzy. This can increase your chance of falling. Ask your doctor what other things that you can do to help prevent falls. This information is not intended to replace advice given to you by your health care provider. Make sure you discuss any questions you have with your health care provider. Document Released: 06/25/2009 Document Revised: 02/04/2016 Document Reviewed: 10/03/2014 Elsevier Interactive Patient Education  2017 [employer].

## 2023-12-12 NOTE — Progress Notes (Signed)
 Subjective:   [name] Brown is a 73 y.o. male who presents for Medicare Annual/Subsequent preventive examination.  Visit Complete: Virtual I connected with  [name] Brown on 12/12/23 by a audio enabled telemedicine application and verified that I am speaking with the correct person using two identifiers.  Patient Location: Home  Provider Location: Home Office  I discussed the limitations of evaluation and management by telemedicine. The patient expressed understanding and agreed to proceed.  Vital Signs: Because this visit was a virtual/telehealth visit, some criteria may be missing or patient reported. Any vitals not documented were not able to be obtained and vitals that have been documented are patient reported.  Patient Medicare AWV questionnaire was completed by the patient on 12-05-2023; I have confirmed that all information answered by patient is correct and no changes since this date.  Cardiac Risk Factors include: advanced age (>52men, >15 women);male gender     Objective:    Today's Vitals   12/12/23 1016  PainSc: 3    There is no height or weight on file to calculate BMI.     12/12/2023   10:18 AM 11/07/2023    6:29 PM 11/03/2023    3:35 PM 11/03/2023   12:42 AM 01/06/2022    9:37 AM 01/04/2021    2:04 PM 01/04/2021    5:25 AM  Advanced Directives  Does Patient Have a Medical Advance Directive? Yes Yes Yes Yes Yes Yes Yes  Type of Estate agent of [employer] Power of [place];Living will Living will;Healthcare Power of [employer] Power of [place];Living will Healthcare Power of [place];Living will Healthcare Power of [place];Living will   Does patient want to make changes to medical advance directive?  No - Patient declined No - Patient declined   No - Patient declined   Copy of Healthcare Power of Attorney in Chart? Yes - validated most recent copy scanned in chart (See row information) No - copy requested No - copy requested  No -  copy requested No - copy requested     Current Medications (verified) Outpatient Encounter Medications as of 12/12/2023  Medication Sig   acetaminophen (TYLENOL) 325 MG tablet Take 1-2 tablets (325-650 mg total) by mouth every 6 (six) hours as needed for mild pain (pain score 1-3).   apixaban (ELIQUIS) 5 MG TABS tablet Take 1 tablet (5 mg total) by mouth 2 (two) times daily.   aspirin EC 81 MG tablet Take 1 tablet (81 mg total) by mouth daily.   calcium carbonate (TUMS - DOSED IN MG ELEMENTAL CALCIUM) 500 MG chewable tablet Chew 1 tablet (200 mg of elemental calcium total) by mouth 3 (three) times daily as needed for indigestion or heartburn.   citalopram (CELEXA) 20 MG tablet Take 1 tablet (20 mg total) by mouth daily.   cyclobenzaprine (FLEXERIL) 5 MG tablet Take 1 tablet (5 mg total) by mouth 3 (three) times daily as needed for muscle spasms.   diltiazem (CARDIZEM CD) 120 MG 24 hr capsule Take 1 capsule (120 mg total) by mouth at bedtime.   ezetimibe (ZETIA) 10 MG tablet TAKE 1 TABLET BY MOUTH DAILY (Patient taking differently: Take 10 mg by mouth at bedtime.)   famotidine (PEPCID) 20 MG tablet Take 20 mg by mouth daily as needed for heartburn or indigestion.   levothyroxine (SYNTHROID) 100 MCG tablet Take 1 tablet (100 mcg total) by mouth daily.   losartan (COZAAR) 100 MG tablet Take 1 tablet (100 mg total) by mouth daily.   Multiple  Vitamin (MULTIVITAMIN WITH MINERALS) TABS tablet Take 1 tablet by mouth daily.   pravastatin (PRAVACHOL) 20 MG tablet Take 1 tablet (20 mg total) by mouth at bedtime.   rOPINIRole (REQUIP) 0.25 MG tablet Take 3 tablets (0.75 mg total) by mouth at bedtime.   traMADol (ULTRAM) 50 MG tablet Take 1 tablet (50 mg total) by mouth every 8 (eight) hours as needed for up to 15 days.   XIIDRA 5 % SOLN Place 1 Dose into both eyes 2 (two) times daily.   oxyCODONE (OXY IR/ROXICODONE) 5 MG immediate release tablet Take 1-2 tablets (5-10 mg total) by mouth every 6 (six) hours  as needed for severe pain (pain score 7-10). (Patient not taking: Reported on 11/30/2023)   senna-docusate (SENOKOT-S) 8.6-50 MG tablet Take 2 tablets by mouth at bedtime.   No facility-administered encounter medications on file as of 12/12/2023.    Allergies (verified) Patient has no known allergies.   History: Past Medical History:  Diagnosis Date   Aortic stenosis    Ascending aortic aneurysm (HCC) 09/18/2018   Colon cancer (HCC)    Family history of adverse reaction to anesthesia    pt states his mother had 2 episodes of hyponatremia following anesthesia    GERD (gastroesophageal reflux disease)    Headache    Heart murmur    Hyperlipidemia 10/25/2016   Hypertension    pt is currently not taking any medications; pt states is borderline   Melanoma (HCC)    OSA on CPAP 09/18/2018   PAF (paroxysmal atrial fibrillation) (HCC) 06/13/2017   Perforated sigmoid colon (HCC)    PVC's (premature ventricular contractions) 10/25/2016   Tinnitus    Wears glasses    Past Surgical History:  Procedure Laterality Date   AORTIC VALVE REPLACEMENT N/A 06/08/2017   Procedure: AORTIC VALVE REPLACEMENT (AVR);  Surgeon: Alleen Borne, MD;  Location: [facility] OR;  Service: Open Heart Surgery;  Laterality: N/A;  Using 25mm Perimount Magna Ease Pericardial Bioprosthesis Aortic Valve   COLOSTOMY CLOSURE N/A 08/18/2016   Procedure: COLOSTOMY CLOSURE OPEN PROCEDURE;  Surgeon: Darnell Level, MD;  Location: [facility] ORS;  Service: General;  Laterality: N/A;   FEMUR IM NAIL Right 11/03/2023   Procedure: INTRAMEDULLARY (IM) NAIL FEMORAL;  Surgeon: Luci Bank, MD;  Location: [facility] OR;  Service: Orthopedics;  Laterality: Right;   LAPAROTOMY N/A 04/25/2016   Procedure: EXPLORATORY LAPAROTOMY WITH SIGMOID COLECTOMY, COLOSTOMY;  Surgeon: Darnell Level, MD;  Location: [facility] ORS;  Service: General;  Laterality: N/A;   LEFT HEART CATH AND CORONARY ANGIOGRAPHY N/A 11/03/2016   Procedure: Left Heart Cath and Coronary Angiography;   Surgeon: Kathleene Hazel, MD;  [facility] CV LAB;  Service: Cardiovascular;  Laterality: N/A;   MELANOMA EXCISION     PARTIAL COLECTOMY Right 08/18/2016   Procedure: RIGHT COLECTOMY;  Surgeon: Darnell Level, MD;  Location: [facility] ORS;  Service: General;  Laterality: Right;   TEE WITHOUT CARDIOVERSION N/A 06/08/2017   Procedure: TRANSESOPHAGEAL ECHOCARDIOGRAM (TEE);  Surgeon: Alleen Borne, MD;  Location: [facility] OR;  Service: Open Heart Surgery;  Laterality: N/A;   Family History  Problem Relation Age of Onset   Dementia Mother    Kidney disease Mother    Vascular Disease Mother    CAD Mother    Heart attack Father    Alcohol abuse Father    Cirrhosis Father    Stroke Maternal Grandmother    Stroke Paternal Grandmother    Social History   Socioeconomic History  Marital status: Married    Spouse name: Not on file   Number of children: Not on file   Years of education: Not on file   Highest education level: Professional school degree (e.g., MD, DDS, DVM, JD)  Occupational History   Occupation: Retired Teacher, early years/pre  Tobacco Use   Smoking status: Never   Smokeless tobacco: Never  Vaping Use   Vaping status: Never Used  Substance and Sexual Activity   Alcohol use: Yes    Alcohol/week: 21.0 - 24.0 standard drinks of alcohol    Types: 21 - 24 Standard drinks or equivalent per week    Comment: 2-3 glasses of wine daily    Drug use: No   Sexual activity: Yes  Other Topics Concern   Not on file  Social History Narrative   Pt lives in [place] with his wife, they are active.      Pt lives at home with his wife.      1 Cat    Social Drivers of Corporate investment banker Strain: Low Risk  (12/12/2023)   Overall Financial Resource Strain (CARDIA)    Difficulty of Paying Living Expenses: Not hard at all  Food Insecurity: No Food Insecurity (12/12/2023)   Hunger Vital Sign    Worried About Running Out of Food in the Last Year: Never true    Ran Out of Food in the Last  Year: Never true  Transportation Needs: No Transportation Needs (12/12/2023)   PRAPARE - Administrator, Civil Service (Medical): No    Lack of Transportation (Non-Medical): No  Physical Activity: Insufficiently Active (12/12/2023)   Exercise Vital Sign    Days of Exercise per Week: 2 days    Minutes of Exercise per Session: 30 min  Stress: Stress Concern Present (12/12/2023)   [employer] of Occupational Health - Occupational Stress Questionnaire    Feeling of Stress : To some extent  Social Connections: Moderately Isolated (12/12/2023)   Social Connection and Isolation Panel [NHANES]    Frequency of Communication with Friends and Family: Three times a week    Frequency of Social Gatherings with Friends and Family: Once a week    Attends Religious Services: Never    Database administrator or Organizations: No    Attends Engineer, structural: Never    Marital Status: Married    Tobacco Counseling Counseling given: Not Answered   Clinical Intake:  Pre-visit preparation completed: Yes  Pain : 0-10 Pain Score: 3  Pain Type: Chronic pain Pain Location: Leg Pain Descriptors / Indicators: Aching, Burning, Dull Pain Onset: 1 to 4 weeks ago Pain Frequency: Intermittent  Pain Score: 3  Diabetes: No  How often do you need to have someone help you when you read instructions, pamphlets, or other written materials from your doctor or pharmacy?: 1 - Never  Interpreter Needed?: No  Information entered by :: Remi Haggard LPN   Activities of Daily Living    12/12/2023   10:26 AM 12/05/2023   10:10 AM  In your present state of health, do you have any difficulty performing the following activities:  Hearing? 0 0  Vision? 0 0  Difficulty concentrating or making decisions? 0 0  Walking or climbing stairs? 0 0  Dressing or bathing? 0 0  Doing errands, shopping? 0 0  Preparing Food and eating ? N N  Using the Toilet? N N  In the past six months, have you  accidently leaked urine? N N  Do you have problems with loss of bowel control? N N  Managing your Medications? N N  Managing your Finances? N N  Housekeeping or managing your Housekeeping? N N    Patient Care Team: Sheliah Hatch, MD as PCP - General (Family Medicine) Chilton Si, MD as PCP - Cardiology (Cardiology) Chilton Si, MD as Attending Physician (Cardiology) Ladene Artist, MD as Consulting Physician (Oncology) Vida Rigger, MD as Consulting Physician (Gastroenterology) Donzetta Starch, MD as Consulting Physician (Dermatology) Darnell Level, MD as Consulting Physician (General Surgery) Huston Foley, MD as Consulting Physician (Neurology) Sinda Du, MD as Consulting Physician (Ophthalmology)  Indicate any recent Medical Services you may have received from other than [facility] providers in the past year (date may be approximate).     Assessment:   This is a routine wellness examination for Norrin.  Hearing/Vision screen Hearing Screening - Comments:: No trouble hearing Vision Screening - Comments:: [facility] Up to date   Goals Addressed             This Visit's Progress    Patient Stated       Would like to loose weight Gain upper body strength        Depression Screen    12/12/2023   10:20 AM 11/30/2023   10:39 AM 11/02/2023   10:58 AM 05/01/2023    9:17 AM 11/03/2022    9:14 AM 05/03/2022    9:11 AM 01/06/2022    9:38 AM  PHQ 2/9 Scores  PHQ - 2 Score 2 2 2 2 2 1  0  PHQ- 9 Score 3 4 5 5 5 4      Fall Risk    12/12/2023   10:15 AM 12/05/2023   10:10 AM 11/30/2023   10:39 AM 11/02/2023   10:14 AM 05/01/2023    9:18 AM  Fall Risk   Falls in the past year? 1 1 1  0 0  Number falls in past yr: 1 0 0 0 0  Injury with Fall? 1 1 1  0 0  Risk for fall due to :    No Fall Risks No Fall Risks  Follow up Falls evaluation completed;Education provided;Falls prevention discussed    Falls evaluation completed    MEDICARE RISK AT HOME: Medicare Risk at  Home Any stairs in or around the home?: Yes If so, are there any without handrails?: No Home free of loose throw rugs in walkways, pet beds, electrical cords, etc?: Yes Adequate lighting in your home to reduce risk of falls?: Yes Life alert?: No Use of a cane, walker or w/c?: No Grab bars in the bathroom?: No Shower chair or bench in shower?: No Elevated toilet seat or a handicapped toilet?: No  TIMED UP AND GO:  Was the test performed?  No    Cognitive Function:    03/28/2018    8:17 AM  MMSE - Mini Mental State Exam  Orientation to time 5  Orientation to Place 5  Registration 3  Attention/ Calculation 5  Recall 3  Language- name 2 objects 2  Language- repeat 1  Language- follow 3 step command 3  Language- read & follow direction 1  Write a sentence 1  Copy design 1  Total score 30        12/12/2023   10:19 AM  6CIT Screen  What Year? 0 points  What month? 0 points  What time? 0 points  Count back from 20 0 points  Months in reverse 0 points  Repeat phrase  0 points  Total Score 0 points    Immunizations Immunization History  Administered Date(s) Administered   Influenza Split 06/13/2013   Influenza,inj,Quad PF,6+ Mos 07/09/2014, 07/02/2015, 06/28/2016, 09/18/2017   Influenza-Unspecified 07/09/2014, 07/02/2015, 06/28/2016, 06/09/2018   Moderna Sars-Covid-2 Vaccination 10/11/2019, 11/08/2019, 09/26/2020   Pneumococcal Conjugate-13 09/23/2016   Pneumococcal Polysaccharide-23 03/28/2018   Td 03/08/2019    TDAP status: Up to date  Flu Vaccine status: Up to date  Pneumococcal vaccine status: Up to date  Covid-19 vaccine status: Information provided on how to obtain vaccines.   Qualifies for Shingles Vaccine? Yes   Zostavax completed No   Shingrix Completed?: No.    Education has been provided regarding the importance of this vaccine. Patient has been advised to call insurance company to determine out of pocket expense if they have not yet received this  vaccine. Advised may also receive vaccine at local pharmacy or Health Dept. Verbalized acceptance and understanding.  Screening Tests Health Maintenance  Topic Date Due   Zoster Vaccines- Shingrix (1 of 2) Never done   COVID-19 Vaccine (4 - 2024-25 season) 05/14/2023   INFLUENZA VACCINE  04/12/2024   Medicare Annual Wellness (AWV)  12/11/2024   DTaP/Tdap/Td (2 - Tdap) 03/07/2029   Colonoscopy  06/29/2031   Pneumonia Vaccine 24+ Years old  Completed   Hepatitis C Screening  Completed   HPV VACCINES  Aged Out    Health Maintenance  Health Maintenance Due  Topic Date Due   Zoster Vaccines- Shingrix (1 of 2) Never done   COVID-19 Vaccine (4 - 2024-25 season) 05/14/2023    Colorectal cancer screening: Type of screening: Colonoscopy. Completed 2022. Repeat every 10 years  Lung Cancer Screening: (Low Dose CT Chest recommended if Age 39-80 years, 20 pack-year currently smoking OR have quit w/in 15years.) does not qualify.   Lung Cancer Screening Referral:   Additional Screening:  Hepatitis C Screening: does not qualify; Completed 2025  Vision Screening: Recommended annual ophthalmology exams for early detection of glaucoma and other disorders of the eye. Is the patient up to date with their annual eye exam?  Yes  Who is the provider or what is the name of the office in which the patient attends annual eye exams? [facility] If pt is not established with a provider, would they like to be referred to a provider to establish care? No .   Dental Screening: Recommended annual dental exams for proper oral hygiene    Community Resource Referral / Chronic Care Management: CRR required this visit?  No   CCM required this visit?  No     Plan:     I have personally reviewed and noted the following in the patient's chart:   Medical and social history Use of alcohol, tobacco or illicit drugs  Current medications and supplements including opioid prescriptions. Patient is not currently  taking opioid prescriptions. Functional ability and status Nutritional status Physical activity Advanced directives List of other physicians Hospitalizations, surgeries, and ER visits in previous 12 months Vitals Screenings to include cognitive, depression, and falls Referrals and appointments  In addition, I have reviewed and discussed with patient certain preventive protocols, quality metrics, and best practice recommendations. A written personalized care plan for preventive services as well as general preventive health recommendations were provided to patient.     Remi Haggard, LPN   09/17/1094   After Visit Summary: (MyChart) Due to this being a telephonic visit, the after visit summary with patients personalized plan was offered to patient via MyChart  Nurse Notes:

## 2023-12-15 DIAGNOSIS — S71001D Unspecified open wound, right hip, subsequent encounter: Secondary | ICD-10-CM | POA: Diagnosis not present

## 2023-12-15 DIAGNOSIS — I82411 Acute embolism and thrombosis of right femoral vein: Secondary | ICD-10-CM | POA: Diagnosis not present

## 2023-12-15 DIAGNOSIS — I10 Essential (primary) hypertension: Secondary | ICD-10-CM | POA: Diagnosis not present

## 2023-12-15 DIAGNOSIS — S7291XD Unspecified fracture of right femur, subsequent encounter for closed fracture with routine healing: Secondary | ICD-10-CM | POA: Diagnosis not present

## 2023-12-15 DIAGNOSIS — I480 Paroxysmal atrial fibrillation: Secondary | ICD-10-CM | POA: Diagnosis not present

## 2023-12-15 DIAGNOSIS — D62 Acute posthemorrhagic anemia: Secondary | ICD-10-CM | POA: Diagnosis not present

## 2023-12-17 ENCOUNTER — Encounter: Payer: Self-pay | Admitting: Family Medicine

## 2023-12-18 ENCOUNTER — Ambulatory Visit: Payer: Self-pay

## 2023-12-18 DIAGNOSIS — S7291XD Unspecified fracture of right femur, subsequent encounter for closed fracture with routine healing: Secondary | ICD-10-CM | POA: Diagnosis not present

## 2023-12-18 DIAGNOSIS — I10 Essential (primary) hypertension: Secondary | ICD-10-CM | POA: Diagnosis not present

## 2023-12-18 DIAGNOSIS — I480 Paroxysmal atrial fibrillation: Secondary | ICD-10-CM | POA: Diagnosis not present

## 2023-12-18 DIAGNOSIS — D62 Acute posthemorrhagic anemia: Secondary | ICD-10-CM | POA: Diagnosis not present

## 2023-12-18 DIAGNOSIS — I82411 Acute embolism and thrombosis of right femoral vein: Secondary | ICD-10-CM | POA: Diagnosis not present

## 2023-12-18 DIAGNOSIS — S71001D Unspecified open wound, right hip, subsequent encounter: Secondary | ICD-10-CM | POA: Diagnosis not present

## 2023-12-18 NOTE — Telephone Encounter (Signed)
 Patient requesting you to take over Eliquis please advise

## 2023-12-18 NOTE — Telephone Encounter (Signed)
 Appt tomorrow with you FYI

## 2023-12-18 NOTE — Telephone Encounter (Signed)
 That is the leg that he recently broke his femur and had surgery.  I am happy to see him, but he needs to make his surgeon aware

## 2023-12-18 NOTE — Telephone Encounter (Signed)
 Copied from CRM [id number]. Topic: Clinical - Red Word Triage >> Dec 18, 2023  1:13 PM Abigail D wrote: Red Word that prompted transfer to Nurse Triage: Swelling: Home Health Physical Therapist Calling: Pt is having increased pain in right leg groin down to right knee 6/10, edema in the right ankle, change in normal condition for pt.   Chief Complaint: Right groin pain down leg, swelling in ankle Symptoms: Above Frequency: 3 days Pertinent Negatives: Patient denies fever, SOB Disposition: [] ED /[] Urgent Care (no appt availability in office) / [x] Appointment(In office/virtual)/ []  [facility]/ [] Home Care/ [] Refused Recommended Disposition /[] [facility]/ []  Follow-up with PCP Additional Notes: Instructed to call back for worsening of symptoms.  Answer Assessment - Initial Assessment Questions 1. ONSET: "When did the pain start?"      3 days 2. LOCATION: "Where is the pain located?"      Right groin an down leg 3. PAIN: "How bad is the pain?"    (Scale 1-10; or mild, moderate, severe)   -  MILD (1-3): doesn't interfere with normal activities    -  MODERATE (4-7): interferes with normal activities (e.g., work or school) or awakens from sleep, limping    -  SEVERE (8-10): excruciating pain, unable to do any normal activities, unable to walk     6 4. WORK OR EXERCISE: "Has there been any recent work or exercise that involved this part of the body?"      no 5. CAUSE: "What do you think is causing the leg pain?"     no 6. OTHER SYMPTOMS: "Do you have any other symptoms?" (e.g., chest pain, back pain, breathing difficulty, swelling, rash, fever, numbness, weakness)     swelling 7. PREGNANCY: "Is there any chance you are pregnant?" "When was your last menstrual period?"     N/a  Protocols used: Leg Pain-A-AH

## 2023-12-19 ENCOUNTER — Ambulatory Visit (INDEPENDENT_AMBULATORY_CARE_PROVIDER_SITE_OTHER): Admitting: Family Medicine

## 2023-12-19 ENCOUNTER — Encounter: Payer: Self-pay | Admitting: Family Medicine

## 2023-12-19 VITALS — BP 130/72 | HR 72 | Temp 98.0°F | Ht 66.0 in | Wt 183.4 lb

## 2023-12-19 DIAGNOSIS — R1031 Right lower quadrant pain: Secondary | ICD-10-CM

## 2023-12-19 MED ORDER — APIXABAN 5 MG PO TABS: 5.0000 mg | ORAL_TABLET | Freq: Two times a day (BID) | ORAL | 3 refills | Status: DC

## 2023-12-19 MED ORDER — TRAMADOL HCL 50 MG PO TABS: 50.0000 mg | ORAL_TABLET | Freq: Three times a day (TID) | ORAL | 0 refills | Status: AC | PRN

## 2023-12-19 NOTE — Progress Notes (Signed)
   Subjective:    Patient ID: [name] Brown, male    DOB: 03-01-1951, 73 y.o.   MRN: [id number]  HPI R leg pain- pt is s/p R femur fx on 2/25.  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  He reports he has had ongoing pain.  Yesterday moved his leg laterally to get off a stool and had severe pain in his R groin.  Pain lasted 2-3 minutes.  After episode, pain returned to baseline.  Denies increased swelling or bruising of R leg.  He did not call Ortho regarding his pain.  Has upcoming appt on Friday.  Other than that sharp pain yesterday, he describes a 'constant ache'   Review of Systems For ROS see HPI     Objective:   Physical Exam Vitals reviewed.  Constitutional:      General: He is not in acute distress.    Appearance: Normal appearance. He is not ill-appearing.  HENT:     Head: Normocephalic and atraumatic.  Cardiovascular:     Pulses: Normal pulses.  Musculoskeletal:        General: Tenderness (mild TTP over R thigh) present. No swelling or deformity.  Skin:    General: Skin is warm and dry.  Neurological:     Mental Status: He is alert and oriented to person, place, and time.     Comments: Able to ambulate with a cane           Assessment & Plan:  R groin pain- new.  Pt had brief episode of sharp pain yesterday when he moved his leg laterally to get off a stool.  He did not notify Ortho.  Reviewed w/ him that if it was his hardware, the pain would not have resolved and he would continue to have intense pain.  His leg is not swollen or taut and he is on his Eliquis BID.  This is unlikely to be his DVT.  Suspect he attempted a movement that either stretched his hip flexors or pulled something that had been repaired during surgery.  He reports pain is at baseline today.  Has appt upcoming w/ Ortho.  Will hold on repeat imaging at this time.  Refilled Tramadol and Eliquis.  Reviewed supportive care and red flags that should prompt return.  Pt  expressed understanding and is in agreement w/ plan.

## 2023-12-19 NOTE — Patient Instructions (Signed)
 Follow up as needed or as scheduled Thankfully, I think the hardware is intact b/c you would be having a much more constant, severe pain I think the Eliquis is working and the clot is improving b/c the swelling has lessened and the leg is not as taut I think the severe pain yesterday was due to a motion you had not yet done since surgery (or you overdid) Call with any questions or concerns Hang in there!!!

## 2023-12-19 NOTE — Telephone Encounter (Signed)
 Form completed and returned to [place]

## 2023-12-20 NOTE — Telephone Encounter (Signed)
 Faxed

## 2023-12-21 DIAGNOSIS — I10 Essential (primary) hypertension: Secondary | ICD-10-CM | POA: Diagnosis not present

## 2023-12-21 DIAGNOSIS — S71001D Unspecified open wound, right hip, subsequent encounter: Secondary | ICD-10-CM | POA: Diagnosis not present

## 2023-12-21 DIAGNOSIS — I82411 Acute embolism and thrombosis of right femoral vein: Secondary | ICD-10-CM | POA: Diagnosis not present

## 2023-12-21 DIAGNOSIS — D62 Acute posthemorrhagic anemia: Secondary | ICD-10-CM | POA: Diagnosis not present

## 2023-12-21 DIAGNOSIS — S7291XD Unspecified fracture of right femur, subsequent encounter for closed fracture with routine healing: Secondary | ICD-10-CM | POA: Diagnosis not present

## 2023-12-21 DIAGNOSIS — I480 Paroxysmal atrial fibrillation: Secondary | ICD-10-CM | POA: Diagnosis not present

## 2023-12-22 DIAGNOSIS — H04123 Dry eye syndrome of bilateral lacrimal glands: Secondary | ICD-10-CM | POA: Diagnosis not present

## 2023-12-22 DIAGNOSIS — Z7982 Long term (current) use of aspirin: Secondary | ICD-10-CM | POA: Diagnosis not present

## 2023-12-22 DIAGNOSIS — E785 Hyperlipidemia, unspecified: Secondary | ICD-10-CM | POA: Diagnosis not present

## 2023-12-22 DIAGNOSIS — I7121 Aneurysm of the ascending aorta, without rupture: Secondary | ICD-10-CM | POA: Diagnosis not present

## 2023-12-22 DIAGNOSIS — I480 Paroxysmal atrial fibrillation: Secondary | ICD-10-CM | POA: Diagnosis not present

## 2023-12-22 DIAGNOSIS — I82411 Acute embolism and thrombosis of right femoral vein: Secondary | ICD-10-CM | POA: Diagnosis not present

## 2023-12-22 DIAGNOSIS — Z6829 Body mass index (BMI) 29.0-29.9, adult: Secondary | ICD-10-CM | POA: Diagnosis not present

## 2023-12-22 DIAGNOSIS — Z7901 Long term (current) use of anticoagulants: Secondary | ICD-10-CM | POA: Diagnosis not present

## 2023-12-22 DIAGNOSIS — I493 Ventricular premature depolarization: Secondary | ICD-10-CM | POA: Diagnosis not present

## 2023-12-22 DIAGNOSIS — E669 Obesity, unspecified: Secondary | ICD-10-CM | POA: Diagnosis not present

## 2023-12-22 DIAGNOSIS — D62 Acute posthemorrhagic anemia: Secondary | ICD-10-CM | POA: Diagnosis not present

## 2023-12-22 DIAGNOSIS — K219 Gastro-esophageal reflux disease without esophagitis: Secondary | ICD-10-CM | POA: Diagnosis not present

## 2023-12-22 DIAGNOSIS — Z85038 Personal history of other malignant neoplasm of large intestine: Secondary | ICD-10-CM | POA: Diagnosis not present

## 2023-12-22 DIAGNOSIS — G2581 Restless legs syndrome: Secondary | ICD-10-CM | POA: Diagnosis not present

## 2023-12-22 DIAGNOSIS — G4733 Obstructive sleep apnea (adult) (pediatric): Secondary | ICD-10-CM | POA: Diagnosis not present

## 2023-12-22 DIAGNOSIS — S71001D Unspecified open wound, right hip, subsequent encounter: Secondary | ICD-10-CM | POA: Diagnosis not present

## 2023-12-22 DIAGNOSIS — E039 Hypothyroidism, unspecified: Secondary | ICD-10-CM | POA: Diagnosis not present

## 2023-12-22 DIAGNOSIS — S7221XD Displaced subtrochanteric fracture of right femur, subsequent encounter for closed fracture with routine healing: Secondary | ICD-10-CM | POA: Diagnosis not present

## 2023-12-22 DIAGNOSIS — K429 Umbilical hernia without obstruction or gangrene: Secondary | ICD-10-CM | POA: Diagnosis not present

## 2023-12-22 DIAGNOSIS — Z952 Presence of prosthetic heart valve: Secondary | ICD-10-CM | POA: Diagnosis not present

## 2023-12-22 DIAGNOSIS — I2510 Atherosclerotic heart disease of native coronary artery without angina pectoris: Secondary | ICD-10-CM | POA: Diagnosis not present

## 2023-12-22 DIAGNOSIS — I10 Essential (primary) hypertension: Secondary | ICD-10-CM | POA: Diagnosis not present

## 2023-12-22 DIAGNOSIS — G4700 Insomnia, unspecified: Secondary | ICD-10-CM | POA: Diagnosis not present

## 2023-12-22 DIAGNOSIS — H9319 Tinnitus, unspecified ear: Secondary | ICD-10-CM | POA: Diagnosis not present

## 2023-12-22 DIAGNOSIS — S7291XD Unspecified fracture of right femur, subsequent encounter for closed fracture with routine healing: Secondary | ICD-10-CM | POA: Diagnosis not present

## 2023-12-22 DIAGNOSIS — F32A Depression, unspecified: Secondary | ICD-10-CM | POA: Diagnosis not present

## 2023-12-26 DIAGNOSIS — I10 Essential (primary) hypertension: Secondary | ICD-10-CM | POA: Diagnosis not present

## 2023-12-26 DIAGNOSIS — D62 Acute posthemorrhagic anemia: Secondary | ICD-10-CM | POA: Diagnosis not present

## 2023-12-26 DIAGNOSIS — S7291XD Unspecified fracture of right femur, subsequent encounter for closed fracture with routine healing: Secondary | ICD-10-CM | POA: Diagnosis not present

## 2023-12-26 DIAGNOSIS — I82411 Acute embolism and thrombosis of right femoral vein: Secondary | ICD-10-CM | POA: Diagnosis not present

## 2023-12-26 DIAGNOSIS — I480 Paroxysmal atrial fibrillation: Secondary | ICD-10-CM | POA: Diagnosis not present

## 2023-12-26 DIAGNOSIS — S71001D Unspecified open wound, right hip, subsequent encounter: Secondary | ICD-10-CM | POA: Diagnosis not present

## 2024-01-02 DIAGNOSIS — S71001D Unspecified open wound, right hip, subsequent encounter: Secondary | ICD-10-CM | POA: Diagnosis not present

## 2024-01-02 DIAGNOSIS — D62 Acute posthemorrhagic anemia: Secondary | ICD-10-CM | POA: Diagnosis not present

## 2024-01-02 DIAGNOSIS — S7291XD Unspecified fracture of right femur, subsequent encounter for closed fracture with routine healing: Secondary | ICD-10-CM | POA: Diagnosis not present

## 2024-01-02 DIAGNOSIS — I480 Paroxysmal atrial fibrillation: Secondary | ICD-10-CM | POA: Diagnosis not present

## 2024-01-02 DIAGNOSIS — I10 Essential (primary) hypertension: Secondary | ICD-10-CM | POA: Diagnosis not present

## 2024-01-02 DIAGNOSIS — I82411 Acute embolism and thrombosis of right femoral vein: Secondary | ICD-10-CM | POA: Diagnosis not present

## 2024-01-10 ENCOUNTER — Telehealth: Payer: Self-pay | Admitting: *Deleted

## 2024-01-10 MED ORDER — ROPINIROLE HCL 0.25 MG PO TABS: 0.7500 mg | ORAL_TABLET | Freq: Every day | ORAL | 0 refills | Status: DC

## 2024-01-11 ENCOUNTER — Ambulatory Visit: Admitting: Family Medicine

## 2024-01-11 ENCOUNTER — Encounter: Payer: Self-pay | Admitting: Family Medicine

## 2024-01-11 VITALS — BP 124/70 | HR 69 | Temp 97.8°F | Wt 180.0 lb

## 2024-01-11 DIAGNOSIS — E039 Hypothyroidism, unspecified: Secondary | ICD-10-CM

## 2024-01-11 DIAGNOSIS — F341 Dysthymic disorder: Secondary | ICD-10-CM | POA: Diagnosis not present

## 2024-01-11 DIAGNOSIS — I10 Essential (primary) hypertension: Secondary | ICD-10-CM

## 2024-01-11 DIAGNOSIS — E7849 Other hyperlipidemia: Secondary | ICD-10-CM | POA: Diagnosis not present

## 2024-01-11 LAB — CBC WITH DIFFERENTIAL/PLATELET
Basophils Absolute: 0 10*3/uL (ref 0.0–0.1)
Basophils Relative: 0.3 % (ref 0.0–3.0)
Eosinophils Absolute: 0.1 10*3/uL (ref 0.0–0.7)
Eosinophils Relative: 1.1 % (ref 0.0–5.0)
HCT: 47 % (ref 39.0–52.0)
Hemoglobin: 15.6 g/dL (ref 13.0–17.0)
Lymphocytes Relative: 14.4 % (ref 12.0–46.0)
Lymphs Abs: 0.9 10*3/uL (ref 0.7–4.0)
MCHC: 33.2 g/dL (ref 30.0–36.0)
MCV: 95.5 fl (ref 78.0–100.0)
Monocytes Absolute: 0.5 10*3/uL (ref 0.1–1.0)
Monocytes Relative: 8.4 % (ref 3.0–12.0)
Neutro Abs: 4.9 10*3/uL (ref 1.4–7.7)
Neutrophils Relative %: 75.8 % (ref 43.0–77.0)
Platelets: 183 10*3/uL (ref 150.0–400.0)
RBC: 4.92 Mil/uL (ref 4.22–5.81)
RDW: 13.8 % (ref 11.5–15.5)
WBC: 6.5 10*3/uL (ref 4.0–10.5)

## 2024-01-11 LAB — LIPID PANEL
Cholesterol: 150 mg/dL (ref 0–200)
HDL: 53.6 mg/dL (ref >39.00)
LDL Cholesterol: 55 mg/dL (ref 0–99)
NonHDL: 96.77
Total CHOL/HDL Ratio: 3
Triglycerides: 211 mg/dL — ABNORMAL HIGH (ref 0.0–149.0)
VLDL: 42.2 mg/dL — ABNORMAL HIGH (ref 0.0–40.0)

## 2024-01-11 LAB — BASIC METABOLIC PANEL WITH GFR
BUN: 16 mg/dL (ref 6–23)
CO2: 28 meq/L (ref 19–32)
Calcium: 9.7 mg/dL (ref 8.4–10.5)
Chloride: 103 meq/L (ref 96–112)
Creatinine, Ser: 0.89 mg/dL (ref 0.40–1.50)
GFR: 85.09 mL/min (ref >60.00)
Glucose, Bld: 88 mg/dL (ref 70–99)
Potassium: 4.7 meq/L (ref 3.5–5.1)
Sodium: 139 meq/L (ref 135–145)

## 2024-01-11 LAB — HEPATIC FUNCTION PANEL
ALT: 24 U/L (ref 0–53)
AST: 21 U/L (ref 0–37)
Albumin: 4.4 g/dL (ref 3.5–5.2)
Alkaline Phosphatase: 142 U/L — ABNORMAL HIGH (ref 39–117)
Bilirubin, Direct: 0.1 mg/dL (ref 0.0–0.3)
Total Bilirubin: 0.4 mg/dL (ref 0.2–1.2)
Total Protein: 6.8 g/dL (ref 6.0–8.3)

## 2024-01-11 LAB — TSH: TSH: 2.51 u[IU]/mL (ref 0.35–5.50)

## 2024-01-11 MED ORDER — CITALOPRAM HYDROBROMIDE 40 MG PO TABS: 40.0000 mg | ORAL_TABLET | Freq: Every day | ORAL | 3 refills | Status: DC

## 2024-01-11 NOTE — Patient Instructions (Signed)
 Follow up in 6 months to recheck blood pressure and cholesterol We'll notify you of your lab results and make any changes if needed INCREASE the Citalopram  to 40mg  daily- new prescription sent Call with any questions or concerns Stay Safe!  Stay Healthy! Happy Spring!!!

## 2024-01-11 NOTE — Assessment & Plan Note (Signed)
 Chronic problem.  Well controlled on Losartan  100mg  daily and Dilt 120mg  daily.  Currently asymptomatic.  Check labs due to ARB use but no anticipated med changes.

## 2024-01-11 NOTE — Assessment & Plan Note (Signed)
Chronic problem.  Currently on Zetia 10mg  daily and Pravastatin 20mg  daily w/o difficulty.  Check labs.  Adjust meds prn

## 2024-01-11 NOTE — Progress Notes (Signed)
   Subjective:    Patient ID: [name] Brown, male    DOB: 11-Jun-1951, 73 y.o.   MRN: [id number]  HPI HTN- chronic problem, on Losartan  100mg  daily, Diltiazem  120mg  daily w/ good control.  No CP, SOB, HA's, visual changes  Hyperlipidemia- chronic problem, on Zetia  10mg  daily, Pravastatin  20mg  daily.  Denies abd pain, N/V.  Hypothyroid- chronic problem, on Levothyroxine  100mcg daily.  No changes to skin/hair/nails.  Dysthymia- pt reports sxs are improving since starting Citalopram .  Has a psychology appt next week.  Currently on Citalopram  20mg  daily.  Feels that there is room for improvement.   Review of Systems For ROS see HPI     Objective:   Physical Exam Vitals reviewed.  Constitutional:      General: He is not in acute distress.    Appearance: Normal appearance. He is well-developed. He is not ill-appearing.  HENT:     Head: Normocephalic and atraumatic.  Eyes:     Extraocular Movements: Extraocular movements intact.     Conjunctiva/sclera: Conjunctivae normal.     Pupils: Pupils are equal, round, and reactive to light.  Neck:     Thyroid : No thyromegaly.  Cardiovascular:     Rate and Rhythm: Normal rate and regular rhythm.     Pulses: Normal pulses.     Heart sounds: Normal heart sounds. No murmur heard. Pulmonary:     Effort: Pulmonary effort is normal. No respiratory distress.     Breath sounds: Normal breath sounds.  Abdominal:     General: Bowel sounds are normal. There is no distension.     Palpations: Abdomen is soft.  Musculoskeletal:     Cervical back: Normal range of motion and neck supple.     Right lower leg: No edema.     Left lower leg: No edema.  Lymphadenopathy:     Cervical: No cervical adenopathy.  Skin:    General: Skin is warm and dry.  Neurological:     General: No focal deficit present.     Mental Status: He is alert and oriented to person, place, and time.     Cranial Nerves: No cranial nerve deficit.  Psychiatric:        Mood and  Affect: Mood normal.        Behavior: Behavior normal.           Assessment & Plan:

## 2024-01-11 NOTE — Assessment & Plan Note (Signed)
 Chronic problem.  On Levothyroxine  100mcg daily.  Asymptomatic at this time.  Check labs.  Adjust meds prn

## 2024-01-11 NOTE — Assessment & Plan Note (Signed)
 Improved since starting Citalopram .  He feels things could be better so at this time will increase to 40mg  daily.  Pt expressed understanding and is in agreement w/ plan.

## 2024-01-12 ENCOUNTER — Encounter: Payer: Self-pay | Admitting: Family Medicine

## 2024-01-12 MED ORDER — CYCLOBENZAPRINE HCL 5 MG PO TABS: 5.0000 mg | ORAL_TABLET | Freq: Three times a day (TID) | ORAL | 0 refills | Status: DC | PRN

## 2024-01-12 NOTE — Telephone Encounter (Signed)
 Looks like someone else sent in the flexeril  last time, ok to send in?

## 2024-01-16 ENCOUNTER — Telehealth: Payer: Self-pay | Admitting: Family Medicine

## 2024-01-16 NOTE — Telephone Encounter (Signed)
 Last seen 11/01/22. Currently scheduled for next appt 08/22/24. Primary MD: Dr. Omar Bibber.  I called pt. Offered sooner appt 01/18/24 at 9am. He already has dental appt. Scheduled for 01/22/24 at 2:30pm, check in 2:00pm. Cx 08/2024 appt since we got him in sooner.

## 2024-01-16 NOTE — Telephone Encounter (Addendum)
 Pt called to reschedule cancelled appointment. Rescheduled his yearly f/u. He did want a earlier appt because he had to cancel February 2025 appt. he was concerned Medicare would not cover his machine if scheduling in December.

## 2024-01-17 DIAGNOSIS — I10 Essential (primary) hypertension: Secondary | ICD-10-CM | POA: Diagnosis not present

## 2024-01-17 DIAGNOSIS — D62 Acute posthemorrhagic anemia: Secondary | ICD-10-CM | POA: Diagnosis not present

## 2024-01-17 DIAGNOSIS — I82411 Acute embolism and thrombosis of right femoral vein: Secondary | ICD-10-CM | POA: Diagnosis not present

## 2024-01-17 DIAGNOSIS — S71001D Unspecified open wound, right hip, subsequent encounter: Secondary | ICD-10-CM | POA: Diagnosis not present

## 2024-01-17 DIAGNOSIS — I480 Paroxysmal atrial fibrillation: Secondary | ICD-10-CM | POA: Diagnosis not present

## 2024-01-17 DIAGNOSIS — S7291XD Unspecified fracture of right femur, subsequent encounter for closed fracture with routine healing: Secondary | ICD-10-CM | POA: Diagnosis not present

## 2024-01-17 NOTE — Patient Instructions (Addendum)
 Please continue using your CPAP regularly. While your insurance requires that you use CPAP at least 4 hours each night on 70% of the nights, I recommend, that you not skip any nights and use it throughout the night if you can. Getting used to CPAP and staying with the treatment long term does take time and patience and discipline. Untreated obstructive sleep apnea when it is moderate to severe can have an adverse impact on cardiovascular health and raise her risk for heart disease, arrhythmias, hypertension, congestive heart failure, stroke and diabetes. Untreated obstructive sleep apnea causes sleep disruption, nonrestorative sleep, and sleep deprivation. This can have an impact on your day to day functioning and cause daytime sleepiness and impairment of cognitive function, memory loss, mood disturbance, and problems focussing. Using CPAP regularly can improve these symptoms.  We will update supply orders, today. You are eligible for a new machine. I will order a repeat sleep study that you will do at home. Please listen out for a call from our sleep lab staff to schedule. Once you complete study, our sleep doctors with evaluate the data and we will use that to order a new machine as indicated. This process could take a few weeks. Once new machine is ordered, you will hear back from your DME company to schedule set up of your new machine.   We will need to see you back within 31-90 days following set up of your new CPAP to document compliance as required by your insurance company. Please call the office to schedule follow up when you receive your new machine. Please feel free to reach out with any questions or concerns.    Continue ropinirole  0.75mg  daily at bedtime. You can be flexible with timing.

## 2024-01-17 NOTE — Progress Notes (Signed)
 PATIENT: [name] Brown DOB: 12-Dec-1950  REASON FOR VISIT: follow up HISTORY FROM: patient  Chief Complaint  Patient presents with   Follow-up    Pt in 2 alone Pt here for cpap f/u Pt states no questions or concerns for todays visit Pt had femur repair 10/2023      HISTORY OF PRESENT ILLNESS:  01/22/24 ALL:  [name] Brown returns for follow up for OSA on CPAP and RLS. He continues to do well. He is using therapy nightly for about 8-9 hours, on average. He denies concerns with machine or supplies. He is eligible for a new machine. He continues ropinirole  0.75mg  at bedtime. RLS remains well managed.     11/01/2022 ALL:  [name] Brown returns for follow up for OSA on CPAP and RLS. He continues ropinirole  0.75mg  daily. He reports doing well. No significant changes. He has continued taking all three tablets of ropinirole  at the same time. He does not feel symptoms are bad enough to adjust dosing. He continues CPAP nightly. No concerns with machine or supplies. He is sleeping well. He is having more fatigue. He is going to the gym but not sure he is making any progress. He feels less motivated. He is on levothyroxine  and reports TSH has steadily increased over past few evaluations. He is followed closely by PCP.     10/28/2021 ALL: [name] Brown is a 73 y.o. male here today for follow up for OSA on CPAP and RLS. He continues ropinirole   0.75mg  at bedtime. He feels this works well to manage symptoms at night. He does have some discomfort in the evenings before bedtime. He is tolerating meds well with no adverse effects. He continues consistent use of CPAP. He is using a F20 FFM. It does push against his lower eyes but has tried multiple other masks that were not a good fit for him. He is followed by ophthalmology. He is using Restasis and eye drops for dry eyes.     HISTORY: (copied from Dr Dail Drought previous note)  Mr. Sowells as a 73 year old right-handed gentleman with an underlying medical history of aortic  stenosis with heart murmur, hypertension, hyperlipidemia, history of melanoma, history of paroxysmal A. fib, reflux disease, history of perforated sigmoid colon, history of PVCs, tinnitus, and mildly overweight state, who presents for follow-up consultation of his RLS and OSA, on treatment with CPAP therapy. The patient is unaccompanied today and presents for his yearly check up.  I last saw him on 09/18/2019, at which time he was fully compliant with his CPAP.  He was able to tolerate Requip .  Requip  was helpful for his restless leg symptoms.  He was doing well with regards to his sleep apnea treatment with CPAP.  He was advised to follow-up routinely in 1 year.  He was advised to continue with ropinirole  0.75 mg each night.   Today, 09/22/2020: I reviewed his CPAP compliance data from the past 30 days from 08/22/2020 through 09/20/2020, during which time he used his machine every night with percent use days greater than 4 hours at 100%, indicating superb compliance with an average usage of 8 hours and 3 minutes, residual AHI at goal at 2.1/h, leak on the low side with a 95th percentile at 1.2 L/min on a pressure of 9 cm without EPR.  He reports doing well with his CPAP.  He is using a small mask, the medium was too large for him.  He is very consistent with the usage and does not mind using CPAP  long-term.  Ropinirole  continues to help with his leg twitching and he takes it every night around 9 PM, he is in bed generally between 1030 and 11 PM.  He has noticed no side effects with the ropinirole , in particular, no significant swelling in the lower extremities, no sleepiness, no abnormal behaviors noted.  He sees his cardiologist twice a year and primary care also twice a year.  He had an increase in his pravastatin .   REVIEW OF SYSTEMS: Out of a complete 14 system review of symptoms, the patient complains only of the following symptoms, RLS, fatigue and all other reviewed systems are negative.  ESS: 7 FSS:  37  ALLERGIES: No Known Allergies  HOME MEDICATIONS: Outpatient Medications Prior to Visit  Medication Sig Dispense Refill   acetaminophen  (TYLENOL ) 325 MG tablet Take 1-2 tablets (325-650 mg total) by mouth every 6 (six) hours as needed for mild pain (pain score 1-3).     apixaban  (ELIQUIS ) 5 MG TABS tablet Take 1 tablet (5 mg total) by mouth 2 (two) times daily. 60 tablet 3   buPROPion  (WELLBUTRIN  XL) 150 MG 24 hr tablet Take 1 tablet (150 mg total) by mouth daily. 30 tablet 0   cyclobenzaprine  (FLEXERIL ) 5 MG tablet Take 1 tablet (5 mg total) by mouth 3 (three) times daily as needed for muscle spasms. 60 tablet 0   diltiazem  (CARDIZEM  CD) 120 MG 24 hr capsule Take 1 capsule (120 mg total) by mouth at bedtime.     ezetimibe  (ZETIA ) 10 MG tablet TAKE 1 TABLET BY MOUTH DAILY 90 tablet 1   famotidine  (PEPCID ) 20 MG tablet Take 20 mg by mouth daily as needed for heartburn or indigestion.     levothyroxine  (SYNTHROID ) 100 MCG tablet Take 1 tablet (100 mcg total) by mouth daily. 90 tablet 1   losartan  (COZAAR ) 100 MG tablet Take 1 tablet (100 mg total) by mouth daily. 90 tablet 3   Multiple Vitamin (MULTIVITAMIN WITH MINERALS) TABS tablet Take 1 tablet by mouth daily.     pravastatin  (PRAVACHOL ) 20 MG tablet Take 1 tablet (20 mg total) by mouth at bedtime. 90 tablet 1   XIIDRA 5 % SOLN Place 1 Dose into both eyes 2 (two) times daily.     rOPINIRole  (REQUIP ) 0.25 MG tablet Take 3 tablets (0.75 mg total) by mouth at bedtime. 90 tablet 0   calcium  carbonate (TUMS - DOSED IN MG ELEMENTAL CALCIUM ) 500 MG chewable tablet Chew 1 tablet (200 mg of elemental calcium  total) by mouth 3 (three) times daily as needed for indigestion or heartburn. (Patient not taking: Reported on 01/18/2024)     citalopram  (CELEXA ) 40 MG tablet Take 1 tablet (40 mg total) by mouth daily. (Patient not taking: Reported on 01/18/2024) 30 tablet 3   No facility-administered medications prior to visit.    PAST MEDICAL HISTORY: Past  Medical History:  Diagnosis Date   Aortic stenosis    Ascending aortic aneurysm (HCC) 09/18/2018   Colon cancer (HCC)    Family history of adverse reaction to anesthesia    pt states his mother had 2 episodes of hyponatremia following anesthesia    GERD (gastroesophageal reflux disease)    Headache    Heart murmur    Hyperlipidemia 10/25/2016   Hypertension    pt is currently not taking any medications; pt states is borderline   Melanoma (HCC)    OSA on CPAP 09/18/2018   PAF (paroxysmal atrial fibrillation) (HCC) 06/13/2017   Perforated sigmoid colon (HCC)  PVC's (premature ventricular contractions) 10/25/2016   Tinnitus    Wears glasses     PAST SURGICAL HISTORY: Past Surgical History:  Procedure Laterality Date   AORTIC VALVE REPLACEMENT N/A 06/08/2017   Procedure: AORTIC VALVE REPLACEMENT (AVR);  Surgeon: Bartley Lightning, MD;  Location: [facility] OR;  Service: Open Heart Surgery;  Laterality: N/A;  Using 25mm Perimount Magna Ease Pericardial Bioprosthesis Aortic Valve   COLOSTOMY CLOSURE N/A 08/18/2016   Procedure: COLOSTOMY CLOSURE OPEN PROCEDURE;  Surgeon: Oralee Billow, MD;  Location: [facility] ORS;  Service: General;  Laterality: N/A;   FEMUR IM NAIL Right 11/03/2023   Procedure: INTRAMEDULLARY (IM) NAIL FEMORAL;  Surgeon: Gaylon Kea, MD;  Location: [facility] OR;  Service: Orthopedics;  Laterality: Right;   FEMUR SURGERY Right    feb 2025   LAPAROTOMY N/A 04/25/2016   Procedure: EXPLORATORY LAPAROTOMY WITH SIGMOID COLECTOMY, COLOSTOMY;  Surgeon: Oralee Billow, MD;  Location: [facility] ORS;  Service: General;  Laterality: N/A;   LEFT HEART CATH AND CORONARY ANGIOGRAPHY N/A 11/03/2016   Procedure: Left Heart Cath and Coronary Angiography;  Surgeon: Odie Benne, MD;  Location: [facility] INVASIVE CV LAB;  Service: Cardiovascular;  Laterality: N/A;   MELANOMA EXCISION     PARTIAL COLECTOMY Right 08/18/2016   Procedure: RIGHT COLECTOMY;  Surgeon: Oralee Billow, MD;  Location: [facility] ORS;  Service:  General;  Laterality: Right;   TEE WITHOUT CARDIOVERSION N/A 06/08/2017   Procedure: TRANSESOPHAGEAL ECHOCARDIOGRAM (TEE);  Surgeon: Bartley Lightning, MD;  Location: [facility] OR;  Service: Open Heart Surgery;  Laterality: N/A;    FAMILY HISTORY: Family History  Problem Relation Age of Onset   Dementia Mother    Kidney disease Mother    Vascular Disease Mother    CAD Mother    Heart attack Father    Alcohol abuse Father    Cirrhosis Father    Stroke Maternal Grandmother    Stroke Paternal Grandmother    Sleep apnea Neg Hx     SOCIAL HISTORY: Social History   Socioeconomic History   Marital status: Married    Spouse name: Not on file   Number of children: Not on file   Years of education: Not on file   Highest education level: Professional school degree (e.g., MD, DDS, DVM, JD)  Occupational History   Occupation: Retired Teacher, early years/pre  Tobacco Use   Smoking status: Never   Smokeless tobacco: Never  Vaping Use   Vaping status: Never Used  Substance and Sexual Activity   Alcohol use: Yes    Alcohol/week: 1.0 standard drink of alcohol    Types: 1 Glasses of wine per week   Drug use: No   Sexual activity: Yes  Other Topics Concern   Not on file  Social History Narrative   Pt lives in [place] with his wife, they are active.      Pt lives at home with his wife.      1 Cat    Retired    Teacher, early years/pre Strain: Low Risk  (12/12/2023)   Overall Financial Resource Strain (CARDIA)    Difficulty of Paying Living Expenses: Not hard at all  Food Insecurity: No Food Insecurity (12/12/2023)   Hunger Vital Sign    Worried About Running Out of Food in the Last Year: Never true    Ran Out of Food in the Last Year: Never true  Transportation Needs: No Transportation Needs (12/12/2023)   PRAPARE - Transportation    Lack of  Transportation (Medical): No    Lack of Transportation (Non-Medical): No  Physical Activity: Insufficiently Active (12/12/2023)    Exercise Vital Sign    Days of Exercise per Week: 2 days    Minutes of Exercise per Session: 30 min  Stress: Stress Concern Present (12/12/2023)   [employer] of Occupational Health - Occupational Stress Questionnaire    Feeling of Stress : To some extent  Social Connections: Moderately Isolated (12/12/2023)   Social Connection and Isolation Panel [NHANES]    Frequency of Communication with Friends and Family: Three times a week    Frequency of Social Gatherings with Friends and Family: Once a week    Attends Religious Services: Never    Database administrator or Organizations: No    Attends Banker Meetings: Never    Marital Status: Married  Catering manager Violence: Not At Risk (12/12/2023)   Humiliation, Afraid, Rape, and Kick questionnaire    Fear of Current or Ex-Partner: No    Emotionally Abused: No    Physically Abused: No    Sexually Abused: No     PHYSICAL EXAM  Vitals:   01/22/24 1417  BP: (!) 144/95  Pulse: 64  Weight: 185 lb (83.9 kg)  Height: 5\' 6"  (1.676 m)     Body mass index is 29.86 kg/m.  Generalized: Well developed, in no acute distress  Cardiology: normal rate and rhythm, no murmur noted Respiratory: clear to auscultation bilaterally  Neurological examination  Mentation: Alert oriented to time, place, history taking. Follows all commands speech and language fluent Cranial nerve II-XII: Pupils were equal round reactive to light. Extraocular movements were full, visual field were full  Motor: The motor testing reveals 5 over 5 strength of all 4 extremities. Good symmetric motor tone is noted throughout.  Gait and station: Gait is normal.    DIAGNOSTIC DATA (LABS, IMAGING, TESTING) - I reviewed patient records, labs, notes, testing and imaging myself where available.     03/28/2018    8:17 AM  MMSE - Mini Mental State Exam  Orientation to time 5  Orientation to Place 5  Registration 3  Attention/ Calculation 5  Recall 3   Language- name 2 objects 2  Language- repeat 1  Language- follow 3 step command 3  Language- read & follow direction 1  Write a sentence 1  Copy design 1  Total score 30     Lab Results  Component Value Date   WBC 6.5 01/11/2024   HGB 15.6 01/11/2024   HCT 47.0 01/11/2024   MCV 95.5 01/11/2024   PLT 183.0 01/11/2024      Component Value Date/Time   NA 139 01/11/2024 1120   NA 138 07/13/2023 0946   K 4.7 01/11/2024 1120   CL 103 01/11/2024 1120   CO2 28 01/11/2024 1120   GLUCOSE 88 01/11/2024 1120   BUN 16 01/11/2024 1120   BUN 12 07/13/2023 0946   CREATININE 0.89 01/11/2024 1120   CREATININE 1.04 10/25/2016 1547   CALCIUM  9.7 01/11/2024 1120   PROT 6.8 01/11/2024 1120   PROT 6.8 07/13/2023 0946   ALBUMIN  4.4 01/11/2024 1120   ALBUMIN  4.4 07/13/2023 0946   AST 21 01/11/2024 1120   ALT 24 01/11/2024 1120   ALKPHOS 142 (H) 01/11/2024 1120   BILITOT 0.4 01/11/2024 1120   BILITOT 0.8 07/13/2023 0946   GFRNONAA >60 11/16/2023 0548   GFRAA 77 09/01/2020 0925   Lab Results  Component Value Date   CHOL 150 01/11/2024  HDL 53.60 01/11/2024   LDLCALC 55 01/11/2024   LDLDIRECT 105.0 05/02/2023   TRIG 211.0 (H) 01/11/2024   CHOLHDL 3 01/11/2024   Lab Results  Component Value Date   HGBA1C 5.5 05/03/2023   Lab Results  Component Value Date   VITAMINB12 347 05/30/2018   Lab Results  Component Value Date   TSH 2.51 01/11/2024     ASSESSMENT AND PLAN 73 y.o. year old male  has a past medical history of Aortic stenosis, Ascending aortic aneurysm (HCC) (09/18/2018), Colon cancer (HCC), Family history of adverse reaction to anesthesia, GERD (gastroesophageal reflux disease), Headache, Heart murmur, Hyperlipidemia (10/25/2016), Hypertension, Melanoma (HCC), OSA on CPAP (09/18/2018), PAF (paroxysmal atrial fibrillation) (HCC) (06/13/2017), Perforated sigmoid colon (HCC), PVC's (premature ventricular contractions) (10/25/2016), Tinnitus, and Wears glasses. here with      ICD-10-CM   1. OSA on CPAP  G47.33 For home use only DME continuous positive airway pressure (CPAP)    2. RLS (restless legs syndrome)  G25.81       Gardiner Jumper is doing well on CPAP therapy. Compliance report reveals excellent compliance. He was encouraged to continue using CPAP nightly and for greater than 4 hours each night. We will update supply orders as indicated. Risks of untreated sleep apnea review and education materials provided. We have ordered HST for eval. Will order new machine pending results. He will continue ropinirole  0.75mg  at bedtime. May take 0.25mg  at dinner and 0.5mg  at bedtime if needed. Healthy lifestyle habits encouraged. He will follow up 31-90 days following receipt of new machine. He verbalizes understanding and agreement with this plan.    Orders Placed This Encounter  Procedures   For home use only DME continuous positive airway pressure (CPAP)    Heated Humidity with all supplies as needed    Length of Need:   Lifetime    Patient has OSA or probable OSA:   Yes    Is the patient currently using CPAP in the home:   Yes    Settings:   Other see comments    CPAP supplies needed:   Mask, headgear, cushions, filters, heated tubing and water chamber     Meds ordered this encounter  Medications   rOPINIRole  (REQUIP ) 0.25 MG tablet    Sig: Take 3 tablets (0.75 mg total) by mouth at bedtime.    Dispense:  270 tablet    Refill:  3    Pt will call when refill is needed    Supervising Provider:   AHERN, ANTONIA B [[id number]]     I spent 30 minutes of face-to-face and non-face-to-face time with patient.  This included previsit chart review, lab review, study review, order entry, electronic health record documentation, patient education.   Terrilyn Fick, FNP-C 01/22/2024, 3:37 PM [facility] [address], [address] [place], [place] [address] [phone]

## 2024-01-18 ENCOUNTER — Encounter (HOSPITAL_COMMUNITY): Payer: Self-pay | Admitting: Psychiatry

## 2024-01-18 ENCOUNTER — Ambulatory Visit (HOSPITAL_BASED_OUTPATIENT_CLINIC_OR_DEPARTMENT_OTHER): Payer: Self-pay | Admitting: Psychiatry

## 2024-01-18 ENCOUNTER — Other Ambulatory Visit: Payer: Self-pay

## 2024-01-18 VITALS — BP 145/75 | HR 76 | Ht 66.0 in | Wt 180.0 lb

## 2024-01-18 DIAGNOSIS — F419 Anxiety disorder, unspecified: Secondary | ICD-10-CM

## 2024-01-18 DIAGNOSIS — F900 Attention-deficit hyperactivity disorder, predominantly inattentive type: Secondary | ICD-10-CM | POA: Diagnosis not present

## 2024-01-18 MED ORDER — BUPROPION HCL ER (XL) 150 MG PO TB24: 150.0000 mg | ORAL_TABLET | Freq: Every day | ORAL | 0 refills | Status: DC

## 2024-01-18 NOTE — Progress Notes (Signed)
 [name] Brown

## 2024-01-18 NOTE — Progress Notes (Signed)
 Psychiatric Initial Adult Assessment   Patient location; office Provider location; office   Patient Identification: [name] Brown MRN:  [id number] Date of Evaluation:  01/18/2024 Referral Source: Dr. Cheryll Corti Chief Complaint:   Chief Complaint  Patient presents with   Establish Care   Visit Diagnosis:    ICD-10-CM   1. ADHD (attention deficit hyperactivity disorder), inattentive type  F90.0 buPROPion (WELLBUTRIN XL) 150 MG 24 hr tablet    2. 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 Cheryll Corti for evaluation.  He was seen by him while he was in rehab after he had a fall and had a broken femur.  Patient has a history of hypertension, hypothyroidism, coronary artery disease, AV replacement, colon cancer and right colectomy.  He was admitted in February after stepping off a ramp with a fall and fractured his femur.  Patient require emergency surgery and status post rehab.  He was ask to see Dr. Cheryll Corti due to his functional decline and his capacity to complete ADLs.  Patient was recommended to start Celexa  and his PCP started him on Celexa  10 mg and now he is up to 40 mg a day.  He admitted history of anxiety, nervousness with decreased attention, focus, difficulty completing his task.  He admitted having ruminative thoughts and having social issues and provisional diagnosis was given for autism spectrum disorder, Asperger disorder.  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  He always very high functioning and able to cope better but require more structure.  He admitted struggle in school in the beginning had failed classes and had to repeat 11th grade.  He was able to achieve his academics when he was under an Engineer, civil (consulting).  Now he feels that he need to address the symptoms because he continues to struggle with decreased attention, focus.  He  plays guitar and also fix and repair guitars.  He admitted there are days when he cannot finish the task on time.  He has difficulty multitasking and some time he ruminates about his past.  He admitted decreased social skills and has limited social network.  He lives with his wife who he has been married since 20.  Patient used to have very close contact with his sister and her husband but lately not as such due to family issues.  Patient told his his wife's brother is married to patient's sister and there is some tension between to family.  Patient has no children.  He denies any hallucination, paranoia, suicidal thoughts or homicidal thoughts.  He denies any aggression, violence.  He admitted anxiety but never tested for ADHD or taken any psychotropic medication until recently Celexa  by primary care.  He noticed Celexa  help his anxiety and irritability but is still he has issues with focus and attention.  He denies any mania, PTSD symptoms.  He considers himself a slower person because he needs a lot of pushed to do things.  He occasionally has nightmares and flashbacks about his job as before he was retired he had a lot of stress at work.  He admitted drinking more frequently at least 4 glass of wine however since he had a fall and fractured his femur he had cut down his drinking.  Since he had a surgery he only had 4 drinks.  He recently finished his outpatient rehabilitation and he feel his strength is  back to normal.  He is not taking any narcotic pain medicine.  Patient reported he had wished that he was tested for Asperger's/autism spectrum disorder/ADHD in his school-age.  He like to go forward for these testings but also like to consider taking medication.  Associated Signs/Symptoms: Depression Symptoms:  difficulty concentrating, hopelessness, anxiety, loss of energy/fatigue, (Hypo) Manic Symptoms:  no manic symptoms Anxiety Symptoms:  Excessive Worry, Social Anxiety, Psychotic Symptoms:  no  psychotic symptoms PTSD Symptoms: NA  Past Psychiatric History: No h/o inpatient, psychosis, mania or suicidal attempt. H/O anxiety and difficulty in attention and focus. Never tested for ADD. Seen By Dr Cheryll Corti in inpatient rehab and suggested antidepressant.    Previous Psychotropic Medications: Yes   Substance Abuse History in the last 12 months:  Yes.    Consequences of Substance Abuse: H/O heaving drinking up to 4 glass of wine but since fall in Feb cut down. No H/O seizure or black outs.  Past Medical History:  Past Medical History:  Diagnosis Date   Aortic stenosis    Ascending aortic aneurysm (HCC) 09/18/2018   Colon cancer (HCC)    Family history of adverse reaction to anesthesia    pt states his mother had 2 episodes of hyponatremia following anesthesia    GERD (gastroesophageal reflux disease)    Headache    Heart murmur    Hyperlipidemia 10/25/2016   Hypertension    pt is currently not taking any medications; pt states is borderline   Melanoma (HCC)    OSA on CPAP 09/18/2018   PAF (paroxysmal atrial fibrillation) (HCC) 06/13/2017   Perforated sigmoid colon (HCC)    PVC's (premature ventricular contractions) 10/25/2016   Tinnitus    Wears glasses     Past Surgical History:  Procedure Laterality Date   AORTIC VALVE REPLACEMENT N/A 06/08/2017   Procedure: AORTIC VALVE REPLACEMENT (AVR);  Surgeon: Bartley Lightning, MD;  Location: [facility] OR;  Service: Open Heart Surgery;  Laterality: N/A;  Using 25mm Perimount Magna Ease Pericardial Bioprosthesis Aortic Valve   COLOSTOMY CLOSURE N/A 08/18/2016   Procedure: COLOSTOMY CLOSURE OPEN PROCEDURE;  Surgeon: Oralee Billow, MD;  Location: [facility] ORS;  Service: General;  Laterality: N/A;   FEMUR IM NAIL Right 11/03/2023   Procedure: INTRAMEDULLARY (IM) NAIL FEMORAL;  Surgeon: Gaylon Kea, MD;  Location: [facility] OR;  Service: Orthopedics;  Laterality: Right;   LAPAROTOMY N/A 04/25/2016   Procedure: EXPLORATORY LAPAROTOMY WITH SIGMOID  COLECTOMY, COLOSTOMY;  Surgeon: Oralee Billow, MD;  Location: [facility] ORS;  Service: General;  Laterality: N/A;   LEFT HEART CATH AND CORONARY ANGIOGRAPHY N/A 11/03/2016   Procedure: Left Heart Cath and Coronary Angiography;  Surgeon: Odie Benne, MD;  Location[facility] INVASIVE CV LAB;  Service: Cardiovascular;  Laterality: N/A;   MELANOMA EXCISION     PARTIAL COLECTOMY Right 08/18/2016   Procedure: RIGHT COLECTOMY;  Surgeon: Oralee Billow, MD;  Location: [facility] ORS;  Service: General;  Laterality: Right;   TEE WITHOUT CARDIOVERSION N/A 06/08/2017   Procedure: TRANSESOPHAGEAL ECHOCARDIOGRAM (TEE);  Surgeon: Bartley Lightning, MD;  Location: [facility] OR;  Service: Open Heart Surgery;  Laterality: N/A;    Family Psychiatric History: Reviewed   Family History:  Family History  Problem Relation Age of Onset   Dementia Mother    Kidney disease Mother    Vascular Disease Mother    CAD Mother    Heart attack Father    Alcohol abuse Father    Cirrhosis Father    Stroke Maternal Grandmother  Stroke Paternal Grandmother     Social History:   Social History   Socioeconomic History   Marital status: Married    Spouse name: Not on file   Number of children: Not on file   Years of education: Not on file   Highest education level: Professional school degree (e.g., MD, DDS, DVM, JD)  Occupational History   Occupation: Retired Teacher, early years/pre  Tobacco Use   Smoking status: Never   Smokeless tobacco: Never  Vaping Use   Vaping status: Never Used  Substance and Sexual Activity   Alcohol use: Yes    Alcohol/week: 21.0 - 24.0 standard drinks of alcohol    Types: 21 - 24 Standard drinks or equivalent per week    Comment: 2-3 glasses of wine daily    Drug use: No   Sexual activity: Yes  Other Topics Concern   Not on file  Social History Narrative   Pt lives in [place] with his wife, they are active.      Pt lives at home with his wife.      1 Cat    Social Drivers of Corporate investment banker  Strain: Low Risk  (12/12/2023)   Overall Financial Resource Strain (CARDIA)    Difficulty of Paying Living Expenses: Not hard at all  Food Insecurity: No Food Insecurity (12/12/2023)   Hunger Vital Sign    Worried About Running Out of Food in the Last Year: Never true    Ran Out of Food in the Last Year: Never true  Transportation Needs: No Transportation Needs (12/12/2023)   PRAPARE - Administrator, Civil Service (Medical): No    Lack of Transportation (Non-Medical): No  Physical Activity: Insufficiently Active (12/12/2023)   Exercise Vital Sign    Days of Exercise per Week: 2 days    Minutes of Exercise per Session: 30 min  Stress: Stress Concern Present (12/12/2023)   [employer] of Occupational Health - Occupational Stress Questionnaire    Feeling of Stress : To some extent  Social Connections: Moderately Isolated (12/12/2023)   Social Connection and Isolation Panel [NHANES]    Frequency of Communication with Friends and Family: Three times a week    Frequency of Social Gatherings with Friends and Family: Once a week    Attends Religious Services: Never    Database administrator or Organizations: No    Attends Banker Meetings: Never    Marital Status: Married    Additional Social History: Patient born and raised in [place] .  He finished school but grades were average and he had failed 11th grade and he has to repeat the 11th grade.  He is married since 70.  He has no children.  He has a sister who lives in town and 2 brothers who live out of town.  Patient is retired in 2016.  He enjoys playing and repairing her guitar.  He also likes fishing, traveling and does gardening.    Allergies:  No Known Allergies  Metabolic Disorder Labs: Lab Results  Component Value Date   HGBA1C 5.5 05/03/2023   MPG 99.67 06/06/2017   No results found for: "PROLACTIN" Lab Results  Component Value Date   CHOL 150 01/11/2024   TRIG 211.0 (H) 01/11/2024   HDL 53.60  01/11/2024   CHOLHDL 3 01/11/2024   VLDL 42.2 (H) 01/11/2024   LDLCALC 55 01/11/2024   LDLCALC 46 11/02/2023   Lab Results  Component Value Date   TSH 2.51  01/11/2024    Therapeutic Level Labs: No results found for: "LITHIUM" No results found for: "CBMZ" No results found for: "VALPROATE"  Current Medications: Current Outpatient Medications  Medication Sig Dispense Refill   acetaminophen  (TYLENOL ) 325 MG tablet Take 1-2 tablets (325-650 mg total) by mouth every 6 (six) hours as needed for mild pain (pain score 1-3).     apixaban  (ELIQUIS ) 5 MG TABS tablet Take 1 tablet (5 mg total) by mouth 2 (two) times daily. 60 tablet 3   citalopram  (CELEXA ) 40 MG tablet Take 1 tablet (40 mg total) by mouth daily. 30 tablet 3   cyclobenzaprine  (FLEXERIL ) 5 MG tablet Take 1 tablet (5 mg total) by mouth 3 (three) times daily as needed for muscle spasms. 60 tablet 0   diltiazem  (CARDIZEM  CD) 120 MG 24 hr capsule Take 1 capsule (120 mg total) by mouth at bedtime.     ezetimibe  (ZETIA ) 10 MG tablet TAKE 1 TABLET BY MOUTH DAILY (Patient taking differently: Take 10 mg by mouth at bedtime.) 90 tablet 1   famotidine  (PEPCID ) 20 MG tablet Take 20 mg by mouth daily as needed for heartburn or indigestion.     levothyroxine  (SYNTHROID ) 100 MCG tablet Take 1 tablet (100 mcg total) by mouth daily. 90 tablet 1   losartan  (COZAAR ) 100 MG tablet Take 1 tablet (100 mg total) by mouth daily. 90 tablet 3   Multiple Vitamin (MULTIVITAMIN WITH MINERALS) TABS tablet Take 1 tablet by mouth daily.     pravastatin  (PRAVACHOL ) 20 MG tablet Take 1 tablet (20 mg total) by mouth at bedtime. 90 tablet 1   rOPINIRole  (REQUIP ) 0.25 MG tablet Take 3 tablets (0.75 mg total) by mouth at bedtime. 90 tablet 0   XIIDRA 5 % SOLN Place 1 Dose into both eyes 2 (two) times daily.     calcium  carbonate (TUMS - DOSED IN MG ELEMENTAL CALCIUM ) 500 MG chewable tablet Chew 1 tablet (200 mg of elemental calcium  total) by mouth 3 (three) times daily  as needed for indigestion or heartburn. (Patient not taking: Reported on 01/18/2024)     No current facility-administered medications for this visit.    Musculoskeletal: Strength & Muscle Tone: within normal limits Gait & Station: normal Patient leans: N/A  Psychiatric Specialty Exam: Review of Systems  Blood pressure (!) 145/75, pulse 76, height 5\' 6"  (1.676 m), weight 180 lb (81.6 kg).Body mass index is 29.05 kg/m.  General Appearance: Casual  Eye Contact:  Good  Speech:  Clear and Coherent  Volume:  Normal  Mood:  Anxious  Affect:  Congruent  Thought Process:  Descriptions of Associations: Intact  Orientation:  Full (Time, Place, and Person)  Thought Content:  Rumination  Suicidal Thoughts:  No  Homicidal Thoughts:  No  Memory:  Immediate;   Good Recent;   Good Remote;   Good  Judgement:  Intact  Insight:  Present  Psychomotor Activity:  Normal  Concentration:  Concentration: Fair and Attention Span: Fair  Recall:  Good  Fund of Knowledge:Good  Language: Good  Akathisia:  No  Handed:  Right  AIMS (if indicated):  not done  Assets:  Communication Skills Desire for Improvement Housing Resilience Social Support Talents/Skills Transportation  ADL's:  Intact  Cognition: WNL  Sleep:  Good   Screenings: AUDIT    Flowsheet Row Clinical Support from 12/12/2023 in [facility] Visit from 11/02/2023 in [facility]  Alcohol Use Disorder Identification Test Final  Score (AUDIT) 0 6       CAGE-AID    Flowsheet Row ED to Hosp-Admission (Discharged) from 11/03/2023 in [facility]  CAGE-AID Score 0      GAD-7    Flowsheet Row [facility] Visit from 01/11/2024 [facility] Visit from 11/02/2023 in [facility] Visit from 05/01/2023 in [facility]  Total GAD-7 Score 2 2 1  0      Mini-Mental    Flowsheet Row Clinical Support from 03/28/2018 in [facility]  Total Score (max 30 points ) 30      PHQ2-9    Flowsheet Row Office Visit from 01/18/2024 [facility] Office Visit from 01/11/2024 in [facility] Clinical Support from 12[facility]12/2023 in [facility] Visit from 11/30/2023 in [facility] Visit from 11/02/2023 in [facility] Total Score 2 2 2 2 2   PHQ-9 Total Score 10 4 3 4 5       Flowsheet Row Admission (Discharged) from 11/07/2023 in [facility] ED to Hosp-Admission (Discharged) from 11/03/2023 in [facility] ED to Hosp-Admission (Discharged) [facility]GICAL  C-SSRS RISK CATEGORY No Risk No Risk No Risk       Assessment and Plan: Patient is 73 year old man with history of AV replacement, colon cancer, right colectomy, CAD, hypothyroidism, hypertension, recently had surgery for fracture femur status post complete rehabitation.  Patient was seen by Dr. Cheryll Corti while he was inpatient rehab and recommend to start medication.  He is now on Celexa  40 mg prescriber Dr. Paulla Bossier which is helping his mood but still struggle with attention and focus.  I reviewed medication list, blood work results, collateral information and notes from other provider.  Review recent PHQ and screening.  I had a long discussion with the patient about considering ADHD meds.  I explained we will require a formal psychological testing to have established diagnosis of  ADHD/Asperger's/autism spectrum disorder.  He can asked Dr. Cheryll Corti to do formal psychological testing otherwise we can refer him to places where testing can be done.  However I also recommend he can try low-dose Wellbutrin to help his attention focus and also kidney benefit for his anxiety and depression.  Patient agreed to give a try.  He will discontinue Celexa  and try Wellbutrin XL 150 mg daily.  I asked about therapy but at this time patient like to focus on medication but happy to consider therapy if needed.  Discussed medication side effects and benefits.  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  Encouraged to call us  back if he feels worsening of the symptom.  Discussed safety concerns at any time having active suicidal thoughts or homicidal for the need to call 911 or go to local emergency room.  Follow-up in 4 weeks.  Collaboration of Care: Other provider involved in patient's care AEB notes are available in epic to review  Patient/Guardian was advised Release of Information must be obtained prior to any record release in order to collaborate their care with an outside  provider. Patient/Guardian was advised if they have not already done so to contact the registration department to sign all necessary forms in order for us  to release information regarding their care.   Consent: Patient/Guardian gives verbal consent for treatment and assignment of benefits for services provided during this visit. Patient/Guardian expressed understanding and agreed to proceed.   I spent 63 minutes face-to-face time during this encounter.  Arturo Late, MD 5/8/20251:10 PM

## 2024-01-19 ENCOUNTER — Other Ambulatory Visit (HOSPITAL_BASED_OUTPATIENT_CLINIC_OR_DEPARTMENT_OTHER): Payer: Self-pay | Admitting: Cardiovascular Disease

## 2024-01-22 ENCOUNTER — Ambulatory Visit (INDEPENDENT_AMBULATORY_CARE_PROVIDER_SITE_OTHER): Admitting: Family Medicine

## 2024-01-22 ENCOUNTER — Encounter: Payer: Self-pay | Admitting: Family Medicine

## 2024-01-22 VITALS — BP 144/95 | HR 64 | Ht 66.0 in | Wt 185.0 lb

## 2024-01-22 DIAGNOSIS — G4733 Obstructive sleep apnea (adult) (pediatric): Secondary | ICD-10-CM

## 2024-01-22 DIAGNOSIS — G2581 Restless legs syndrome: Secondary | ICD-10-CM

## 2024-01-22 MED ORDER — ROPINIROLE HCL 0.25 MG PO TABS: 0.7500 mg | ORAL_TABLET | Freq: Every day | ORAL | 3 refills | Status: AC

## 2024-01-29 ENCOUNTER — Telehealth: Payer: Self-pay | Admitting: Family Medicine

## 2024-01-29 ENCOUNTER — Encounter: Payer: Self-pay | Admitting: Family Medicine

## 2024-01-29 ENCOUNTER — Other Ambulatory Visit: Payer: Self-pay | Admitting: Family Medicine

## 2024-01-29 DIAGNOSIS — G4733 Obstructive sleep apnea (adult) (pediatric): Secondary | ICD-10-CM

## 2024-01-29 NOTE — Telephone Encounter (Signed)
 Pt called requesting to speak to Provider ,no one has contact  Pt for in home sleep study .  Pt would like to know when will they reach out to him ?

## 2024-01-29 NOTE — Telephone Encounter (Signed)
Will reply to pt mychart message

## 2024-02-11 ENCOUNTER — Other Ambulatory Visit (HOSPITAL_COMMUNITY): Payer: Self-pay | Admitting: Psychiatry

## 2024-02-11 DIAGNOSIS — F419 Anxiety disorder, unspecified: Secondary | ICD-10-CM

## 2024-02-11 DIAGNOSIS — F900 Attention-deficit hyperactivity disorder, predominantly inattentive type: Secondary | ICD-10-CM

## 2024-02-12 ENCOUNTER — Encounter (HOSPITAL_COMMUNITY): Payer: Self-pay

## 2024-02-13 ENCOUNTER — Ambulatory Visit (INDEPENDENT_AMBULATORY_CARE_PROVIDER_SITE_OTHER): Admitting: Neurology

## 2024-02-13 DIAGNOSIS — G4733 Obstructive sleep apnea (adult) (pediatric): Secondary | ICD-10-CM

## 2024-02-14 ENCOUNTER — Other Ambulatory Visit (HOSPITAL_COMMUNITY): Payer: Self-pay

## 2024-02-14 DIAGNOSIS — F900 Attention-deficit hyperactivity disorder, predominantly inattentive type: Secondary | ICD-10-CM

## 2024-02-14 DIAGNOSIS — F419 Anxiety disorder, unspecified: Secondary | ICD-10-CM

## 2024-02-14 MED ORDER — BUPROPION HCL ER (XL) 150 MG PO TB24: 150.0000 mg | ORAL_TABLET | Freq: Every day | ORAL | 0 refills | Status: DC

## 2024-02-14 NOTE — Progress Notes (Signed)
 See procedure note.

## 2024-02-22 DIAGNOSIS — S7221XD Displaced subtrochanteric fracture of right femur, subsequent encounter for closed fracture with routine healing: Secondary | ICD-10-CM | POA: Diagnosis not present

## 2024-02-25 ENCOUNTER — Encounter: Payer: Self-pay | Admitting: Neurology

## 2024-02-25 NOTE — Procedures (Signed)
 [facility]  HOME SLEEP TEST (Watch PAT) REPORT  STUDY DATE: 02/14/2024  DOB: July 31, 1951  MRN: [id number]  ORDERING CLINICIAN: Debbra Fairy, MD, PhD   REFERRING CLINICIAN: Terrilyn Fick, NP  CLINICAL INFORMATION/HISTORY: 73 year old male with an underlying medical history of aortic stenosis with heart murmur, hypertension, hyperlipidemia, history of melanoma, history of paroxysmal A. fib, reflux disease, history of perforated sigmoid colon, history of PVCs, tinnitus, RLS, OSA, and mildly overweight state, who presents for reevaluation of his obstructive sleep apnea.  He has been compliant with CPAP of 9 cm without EPR with good tolerance of treatment and good apnea reduction.  He should be eligible for a new machine.  BMI: 29.9 kg/m  FINDINGS:   Sleep Summary:   Total Recording Time (hours, min): 7 hours, 56 min  Total Sleep Time (hours, min):  7 hours, 3 min  Percent REM (%):    21.5%   Respiratory Indices:   Calculated pAHI (per hour):  16.2/hour         REM pAHI:    28.6/hour       NREM pAHI: 12.7/hour  Central pAHI: 2.9/hour  Oxygen Saturation Statistics:    Oxygen Saturation (%) Mean: 93%   Minimum oxygen saturation (%):                 83%   O2 Saturation Range (%): 83-99%    O2 Saturation (minutes) <=88%: 1.1 min  Pulse Rate Statistics:   Pulse Mean (bpm):    77/min    Pulse Range (47-100/min)   IMPRESSION: OSA (obstructive sleep apnea), moderate    RECOMMENDATION:  This home sleep test demonstrates moderate obstructive sleep apnea with a total AHI of 16.2/hour (utilizing the 4% desaturation criteria for obstructive hypopneas per Medicare guidelines) and O2 nadir of 83%.  Variable snoring was detected, ranging from mild to louder.  Ongoing treatment with a positive airway pressure (PAP) device is recommended. The patient has been compliant with his CPAP of 9 cm with good apnea control and good tolerance of treatment.  He should be  eligible for a new machine.  I recommend prescribing a new machine and keeping the settings the same, mask of choice, sized to fit.  A full night titration study may be considered to optimize treatment settings, monitor proper oxygen saturations and aid with improvement of tolerance and adherence, if needed down the road. Alternative treatment options may include a dental device through dentistry or orthodontics in selected patients or Inspire (hypoglossal nerve stimulator) in carefully selected patients (meeting inclusion criteria).  Concomitant weight loss is recommended (where clinically appropriate). Please note that untreated obstructive sleep apnea may carry additional perioperative morbidity. Patients with significant obstructive sleep apnea should receive perioperative PAP therapy and the surgeons and particularly the anesthesiologist should be informed of the diagnosis and the severity of the sleep disordered breathing. The patient should be cautioned not to drive, work at heights, or operate dangerous or heavy equipment when tired or sleepy. Review and reiteration of good sleep hygiene measures should be pursued with any patient. Other causes of the patient's symptoms, including circadian rhythm disturbances, an underlying mood disorder, medication effect and/or an underlying medical problem cannot be ruled out based on this test. Clinical correlation is recommended.  The patient and his referring provider will be notified of the test results. The patient will be seen in follow up in sleep clinic at [facility].  I certify that I have reviewed the raw data recording prior to the  issuance of this report in accordance with the standards of the American Academy of Sleep Medicine (AASM).    INTERPRETING PHYSICIAN:   Debbra Fairy, MD, PhD Medical Director, [facility]) Diplomat, ABPN (Neurology and Sleep)   [facility] [address] [place], [place] [address] [phone]

## 2024-02-26 ENCOUNTER — Ambulatory Visit: Payer: Self-pay | Admitting: Family Medicine

## 2024-02-26 DIAGNOSIS — G4733 Obstructive sleep apnea (adult) (pediatric): Secondary | ICD-10-CM

## 2024-02-27 NOTE — Telephone Encounter (Signed)
 [name] Brown

## 2024-03-12 ENCOUNTER — Encounter (HOSPITAL_COMMUNITY): Payer: Self-pay | Admitting: Psychiatry

## 2024-03-12 ENCOUNTER — Telehealth (HOSPITAL_BASED_OUTPATIENT_CLINIC_OR_DEPARTMENT_OTHER): Admitting: Psychiatry

## 2024-03-12 DIAGNOSIS — F900 Attention-deficit hyperactivity disorder, predominantly inattentive type: Secondary | ICD-10-CM

## 2024-03-12 DIAGNOSIS — F419 Anxiety disorder, unspecified: Secondary | ICD-10-CM

## 2024-03-12 MED ORDER — BUPROPION HCL ER (XL) 300 MG PO TB24
300.0000 mg | ORAL_TABLET | Freq: Every day | ORAL | 1 refills | Status: DC
Start: 2024-03-12 — End: 2024-05-14

## 2024-03-12 NOTE — Progress Notes (Signed)
 [name] Virtual Progress Note   Patient Location: Home Provider Location: Home Office  I connect with patient by video and verified that I am speaking with correct person by using two identifiers. I discussed the limitations of evaluation and management by telemedicine and the availability of in person appointments. I also discussed with the patient that there may be a patient responsible charge related to this service. The patient expressed understanding and agreed to proceed.  [name] Brown [id number] 73 y.o.  03/12/2024 11:29 AM  History of Present Illness:  Patient is evaluated by video session.  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 Corina for anxiety and ADHD evaluation.  We started him on low-dose Wellbutrin  and he stopped Celexa .  He noticed improvement in his motivation, energy level and he is more active.  He started going to gym and had a Systems analyst at [employer].  He feels about the surgery recovery is much better.  He noticed improvement with the medication and denies any major side effects.  He sleeps good.  His appetite is okay.  We have recommended to have ADHD evaluation as patient wondering if he has Asperger/autism spectrum disorder.  Recently he had visit with neurology apnea and he was told that machine adjustment may be needed due to moderate apnea.  He feels more motivated to do things and now back doing projects for his guitar.  He still struggle with attention and focus but overall he feels the medicine had helped to be more motivated.  Patient plays guitar and also fix and repair them.  He is also belong to the club coordinator preservation and next month going to mountains for hiking.  When he has a beach trip and fall with his wife.  Sometimes struggle with multitasking and he ruminates about his past.  Patient lives with his wife and has been married since  17.  He has very close contact with his sister and her husband.  Patient has no children.  He denies any panic attack or feeling overwhelmed.  He feels the medicine helping.  He had cut down his drinking in recent months after he had a fall and fractured his femur.  Past Psychiatric History: No history of inpatient psychosis, mania, suicidal attempt.  History of anxiety, difficulty in attention and focus but never tested for ADD.  Seen by Dr. Corina in inpatient rehab and suggested antidepressant.  History of heavy drinking but no withdrawals, seizures or blackout.   Outpatient Encounter Medications as of 03/12/2024  Medication Sig   acetaminophen  (TYLENOL ) 325 MG tablet Take 1-2 tablets (325-650 mg total) by mouth every 6 (six) hours as needed for mild pain (pain score 1-3).   apixaban  (ELIQUIS ) 5 MG TABS tablet Take 1 tablet (5 mg total) by mouth 2 (two) times daily.   buPROPion  (WELLBUTRIN  XL) 150 MG 24 hr tablet Take 1 tablet (150 mg total) by mouth daily.   cyclobenzaprine  (FLEXERIL ) 5 MG tablet Take 1 tablet (5 mg total) by mouth 3 (three) times daily as needed for muscle spasms.   diltiazem  (CARDIZEM  CD) 120 MG 24 hr capsule Take 1 capsule (120 mg total) by mouth at bedtime.   ezetimibe  (ZETIA ) 10 MG tablet TAKE 1 TABLET BY MOUTH DAILY   famotidine  (PEPCID ) 20 MG tablet Take 20 mg by mouth daily as needed for heartburn or indigestion.   levothyroxine  (SYNTHROID ) 100 MCG tablet Take  1 tablet (100 mcg total) by mouth daily.   losartan  (COZAAR ) 100 MG tablet Take 1 tablet (100 mg total) by mouth daily.   Multiple Vitamin (MULTIVITAMIN WITH MINERALS) TABS tablet Take 1 tablet by mouth daily.   pravastatin  (PRAVACHOL ) 20 MG tablet Take 1 tablet (20 mg total) by mouth at bedtime.   rOPINIRole  (REQUIP ) 0.25 MG tablet Take 3 tablets (0.75 mg total) by mouth at bedtime.   XIIDRA 5 % SOLN Place 1 Dose into both eyes 2 (two) times daily.   No facility-administered encounter medications on file  as of 03/12/2024.    Recent Results (from the past 2160 hours)  Lipid panel     Status: Abnormal   Collection Time: 01/11/24 11:20 AM  Result Value Ref Range   Cholesterol 150 0 - 200 mg/dL    Comment: ATP III Classification       Desirable:  < 200 mg/dL               Borderline High:  200 - 239 mg/dL          High:  > = 759 mg/dL   Triglycerides 788.9 (H) 0.0 - 149.0 mg/dL    Comment: Normal:  <849 mg/dLBorderline High:  150 - 199 mg/dL   HDL 46.39 >60.99 mg/dL   VLDL 57.7 (H) 0.0 - 59.9 mg/dL   LDL Cholesterol 55 0 - 99 mg/dL   Total CHOL/HDL Ratio 3     Comment:                Men          Women1/2 Average Risk     3.4          3.3Average Risk          5.0          4.42X Average Risk          9.6          7.13X Average Risk          15.0          11.0                       NonHDL 96.77     Comment: NOTE:  Non-HDL goal should be 30 mg/dL higher than patient's LDL goal (i.e. LDL goal of < 70 mg/dL, would have non-HDL goal of < 100 mg/dL)  Basic metabolic panel with GFR     Status: None   Collection Time: 01/11/24 11:20 AM  Result Value Ref Range   Sodium 139 135 - 145 mEq/L   Potassium 4.7 3.5 - 5.1 mEq/L   Chloride 103 96 - 112 mEq/L   CO2 28 19 - 32 mEq/L   Glucose, Bld 88 70 - 99 mg/dL   BUN 16 6 - 23 mg/dL   Creatinine, Ser 9.10 0.40 - 1.50 mg/dL   GFR 14.90 >39.99 mL/min    Comment: Calculated using the CKD-EPI Creatinine Equation (2021)   Calcium  9.7 8.4 - 10.5 mg/dL  TSH     Status: None   Collection Time: 01/11/24 11:20 AM  Result Value Ref Range   TSH 2.51 0.35 - 5.50 uIU/mL  Hepatic function panel     Status: Abnormal   Collection Time: 01/11/24 11:20 AM  Result Value Ref Range   Total Bilirubin 0.4 0.2 - 1.2 mg/dL   Bilirubin, Direct 0.1 0.0 - 0.3 mg/dL   Alkaline Phosphatase 142 (H) 39 -  117 U/L   AST 21 0 - 37 U/L   ALT 24 0 - 53 U/L   Total Protein 6.8 6.0 - 8.3 g/dL   Albumin  4.4 3.5 - 5.2 g/dL  CBC with Differential/Platelet     Status: None   Collection  Time: 01/11/24 11:20 AM  Result Value Ref Range   WBC 6.5 4.0 - 10.5 K/uL   RBC 4.92 4.22 - 5.81 Mil/uL   Hemoglobin 15.6 13.0 - 17.0 g/dL   HCT 52.9 60.9 - 47.9 %   MCV 95.5 78.0 - 100.0 fl   MCHC 33.2 30.0 - 36.0 g/dL   RDW 86.1 88.4 - 84.4 %   Platelets 183.0 150.0 - 400.0 K/uL   Neutrophils Relative % 75.8 43.0 - 77.0 %   Lymphocytes Relative 14.4 12.0 - 46.0 %   Monocytes Relative 8.4 3.0 - 12.0 %   Eosinophils Relative 1.1 0.0 - 5.0 %   Basophils Relative 0.3 0.0 - 3.0 %   Neutro Abs 4.9 1.4 - 7.7 K/uL   Lymphs Abs 0.9 0.7 - 4.0 K/uL   Monocytes Absolute 0.5 0.1 - 1.0 K/uL   Eosinophils Absolute 0.1 0.0 - 0.7 K/uL   Basophils Absolute 0.0 0.0 - 0.1 K/uL     Psychiatric Specialty Exam: Physical Exam  Review of Systems  Weight 185 lb (83.9 kg).There is no height or weight on file to calculate BMI.  General Appearance: Casual  Eye Contact:  Good  Speech:  Clear and Coherent  Volume:  Normal  Mood:  better  Affect:  Appropriate  Thought Process:  Goal Directed  Orientation:  Full (Time, Place, and Person)  Thought Content:  Rumination  Suicidal Thoughts:  No  Homicidal Thoughts:  No  Memory:  Immediate;   Good Recent;   Good Remote;   Good  Judgement:  Intact  Insight:  Present  Psychomotor Activity:  Normal  Concentration:  Concentration: Fair and Attention Span: Fair  Recall:  Good  Fund of Knowledge:  Good  Language:  Good  Akathisia:  No  Handed:  Right  AIMS (if indicated):     Assets:  Communication Skills Desire for Improvement Housing Social Support Talents/Skills Transportation  ADL's:  Intact  Cognition:  WNL  Sleep:  good       01/18/2024    1:06 PM 01/11/2024   10:53 AM 12/12/2023   10:20 AM 11/30/2023   10:39 AM 11/02/2023   10:58 AM  Depression screen PHQ 2/9  Decreased Interest 1 1 1 1 1   Down, Depressed, Hopeless 1 1 1 1 1   PHQ - 2 Score 2 2 2 2 2   Altered sleeping 2 0 0 1 1  Tired, decreased energy 2 1 0 0 1  Change in appetite 0 0  0 1 0  Feeling bad or failure about yourself  1 1 0 0 1  Trouble concentrating 3 0 1 0 0  Moving slowly or fidgety/restless 0 0 0 0 0  Suicidal thoughts 0 0 0 0 0  PHQ-9 Score 10 4 3 4 5   Difficult doing work/chores  Somewhat difficult Not difficult at all Somewhat difficult Somewhat difficult    Assessment/Plan: ADHD (attention deficit hyperactivity disorder), inattentive type - Plan: buPROPion  (WELLBUTRIN  XL) 300 MG 24 hr tablet  Anxiety - Plan: buPROPion  (WELLBUTRIN  XL) 300 MG 24 hr tablet  Patient is 73 year old man with history of AV replacement, colon cancer, right colectomy, CAD, hypothyroidism, hypertension, sleep apnea and recently completed rehab status post  femur fracture.  I reviewed notes from neurology and blood work results.  Liver enzymes are better.  He is doing better on Wellbutrin  150 mg.  He is no longer taking Celexa  which was prescribed by PCP.  Discussed about decreased focus attention but overall motivation is improved.  Emphasized to consider complete psychological testing to establish diagnosis for ADHD/autism spectrum disorder/Asperger.  We discussed increase the Wellbutrin  dose since it is helping his motivation.  Patient agreed with the plan.  Will try Wellbutrin  XL 300 mg in the morning.  Encouraged to call back if he notices any worsening symptoms or side effects.  Will follow-up in 2 months.  I also recommend if he has difficulty getting in touch with Dr. Corina for appointments then he can call us  and we will refer him for other places for the testing.   Follow Up Instructions:     I discussed the assessment and treatment plan with the patient. The patient was provided an opportunity to ask questions and all were answered. The patient agreed with the plan and demonstrated an understanding of the instructions.   The patient was advised to call back or seek an in-person evaluation if the symptoms worsen or if the condition fails to improve as  anticipated.    Collaboration of Care: Other provider involved in patient's care AEB notes are available in epic to review.  Patient/Guardian was advised Release of Information must be obtained prior to any record release in order to collaborate their care with an outside provider. Patient/Guardian was advised if they have not already done so to contact the registration department to sign all necessary forms in order for us  to release information regarding their care.   Consent: Patient/Guardian gives verbal consent for treatment and assignment of benefits for services provided during this visit. Patient/Guardian expressed understanding and agreed to proceed.     Total encounter time 27 minutes which includes face-to-face time, chart reviewed, care coordination, order entry and documentation during this encounter.   Note: This document was prepared by [employer] voice dictation technology and any errors that results from this process are unintentional.    Leni ONEIDA Client, MD 03/12/2024

## 2024-03-14 NOTE — Telephone Encounter (Signed)
 I received the below fax pt scheduled on  06/05/24 @ 9am for initial cpap visit with Amy lomax, NP.

## 2024-03-19 DIAGNOSIS — H35351 Cystoid macular degeneration, right eye: Secondary | ICD-10-CM | POA: Diagnosis not present

## 2024-03-19 DIAGNOSIS — H04123 Dry eye syndrome of bilateral lacrimal glands: Secondary | ICD-10-CM | POA: Diagnosis not present

## 2024-04-01 ENCOUNTER — Encounter (HOSPITAL_COMMUNITY): Payer: Self-pay

## 2024-04-01 NOTE — Telephone Encounter (Signed)
 Please provide referral to see Dr. Corina.  He had seen him in the past and he was admitted in rehab.

## 2024-04-04 ENCOUNTER — Encounter: Payer: Self-pay | Admitting: Family Medicine

## 2024-04-04 ENCOUNTER — Encounter (HOSPITAL_BASED_OUTPATIENT_CLINIC_OR_DEPARTMENT_OTHER): Payer: Self-pay | Admitting: Cardiovascular Disease

## 2024-04-04 NOTE — Telephone Encounter (Signed)
 Patient sent the following MyChart message:   Can you prescribe Viagra  for me?  I have used both Viagra  and Cialis in the recent past with no problems.  Please advise, thank you

## 2024-04-05 ENCOUNTER — Ambulatory Visit (INDEPENDENT_AMBULATORY_CARE_PROVIDER_SITE_OTHER): Admitting: Student in an Organized Health Care Education/Training Program

## 2024-04-05 VITALS — BP 140/75 | HR 66 | Ht 66.0 in | Wt 188.6 lb

## 2024-04-05 DIAGNOSIS — R198 Other specified symptoms and signs involving the digestive system and abdomen: Secondary | ICD-10-CM | POA: Insufficient documentation

## 2024-04-05 DIAGNOSIS — N529 Male erectile dysfunction, unspecified: Secondary | ICD-10-CM | POA: Insufficient documentation

## 2024-04-05 MED ORDER — SILDENAFIL CITRATE 100 MG PO TABS: 100.0000 mg | ORAL_TABLET | Freq: Every day | ORAL | 5 refills | Status: AC | PRN

## 2024-04-05 NOTE — Assessment & Plan Note (Signed)
 Chronic and stable. Viagra  is preferred over Cialis due to its shorter duration of side effects and suitability for sexual activity. Prescribed Viagra  100 mg and send the prescription to [facility].

## 2024-04-05 NOTE — Progress Notes (Signed)
   Acute Office Visit  Subjective:     Patient ID: [name] Brown, male    DOB: 12-Feb-1951, 73 y.o.   MRN: [id number]  Chief Complaint  Patient presents with   Erectile Dysfunction    Needs medication Viagra .     HPI  Discussed the use of AI scribe software for clinical note transcription with the patient, who gave verbal consent to proceed.  History of Present Illness [name] Brown is a 73 year old male who presents for a prescription refill of Viagra .  He has been using Viagra  for five years, typically less than once a week, and prefers the 100 mg dose over the 50 mg dose. He has been prescribed Viagra  by his cardiologist and Cialis by a urologist. He prefers Viagra  due to the shorter duration of side effects, which include headaches and mild hypotension with Cialis. His insurance, Medicare, has covered the medication in the past.  He has a history of heart issues. He has not experienced any recent heart attacks and is able to engage in physical activities such as walking up stairs and using an elliptical or treadmill. He experienced a femur fracture in February, requiring hospitalization, but is now 95% recovered and working with a Systems analyst. He is currently on apixaban  due to the femur fracture. No current chest pain.     Objective:    BP (!) 140/75 (BP Location: Right Arm, Patient Position: Sitting, Cuff Size: Normal)   Pulse 66   Ht 5' 6 (1.676 m)   Wt 188 lb 9.6 oz (85.5 kg)   SpO2 98%   BMI 30.44 kg/m   Physical Exam  Gen: Well-appearing man Heart: Regular, 3 out of 6 early systolic murmur best heard at the left upper sternal border Lungs: Unlabored, clear throughout Ext: Warm, no edema, normal joints      Assessment & Plan:    Problem List Items Addressed This Visit       Unprioritized   Erectile dysfunction - Primary   Chronic and stable. Viagra  is preferred over Cialis due to its shorter duration of side effects and suitability for sexual activity.  Prescribed Viagra  100 mg and send the prescription to [facility].      Relevant Medications   sildenafil  (VIAGRA ) 100 MG tablet    Meds ordered this encounter  Medications   sildenafil  (VIAGRA ) 100 MG tablet    Sig: Take 1 tablet (100 mg total) by mouth daily as needed for erectile dysfunction.    Dispense:  30 tablet    Refill:  5    Return if symptoms worsen or fail to improve.  Cleatus Debby Specking, MD

## 2024-04-05 NOTE — Patient Instructions (Signed)
  VISIT SUMMARY: Today, you came in for a prescription refill of Viagra . We discussed your preference for Viagra  over Cialis due to the shorter duration of side effects. We also reviewed your history of heart issues, your recent femur fracture, and your current medications.  YOUR PLAN: -ERECTILE DYSFUNCTION: Erectile dysfunction is the inability to achieve or maintain an erection suitable for sexual activity. You prefer Viagra  over Cialis due to its shorter duration of side effects. We have prescribed Viagra  100 mg and sent the prescription to Arlo[facility] Prior at [facility].  -AORTIC VALVE REPLACEMENT: You have had an aortic valve replacement and currently have a heart murmur. You are taking baby aspirin  and do not need long-term anticoagulation therapy.  -FEMUR FRACTURE: You have recovered 95% from your femur fracture and are working on strength and mobility training. Continue taking apixaban  until August.  INSTRUCTIONS: Please follow up with your cardiologist and urologist as needed. Continue your current medications and physical activities. If you experience any new symptoms or have concerns, contact our office.

## 2024-04-17 ENCOUNTER — Other Ambulatory Visit: Payer: Self-pay

## 2024-04-17 MED ORDER — APIXABAN 5 MG PO TABS: 5.0000 mg | ORAL_TABLET | Freq: Two times a day (BID) | ORAL | 3 refills | Status: DC

## 2024-04-30 ENCOUNTER — Encounter: Payer: Self-pay | Admitting: Psychology

## 2024-05-13 ENCOUNTER — Other Ambulatory Visit: Payer: Self-pay | Admitting: Cardiovascular Disease

## 2024-05-14 ENCOUNTER — Encounter (HOSPITAL_COMMUNITY): Payer: Self-pay | Admitting: Psychiatry

## 2024-05-14 ENCOUNTER — Telehealth (HOSPITAL_BASED_OUTPATIENT_CLINIC_OR_DEPARTMENT_OTHER): Admitting: Psychiatry

## 2024-05-14 VITALS — Wt 188.0 lb

## 2024-05-14 DIAGNOSIS — F419 Anxiety disorder, unspecified: Secondary | ICD-10-CM | POA: Diagnosis not present

## 2024-05-14 DIAGNOSIS — F900 Attention-deficit hyperactivity disorder, predominantly inattentive type: Secondary | ICD-10-CM | POA: Diagnosis not present

## 2024-05-14 MED ORDER — BUPROPION HCL ER (XL) 300 MG PO TB24: 300.0000 mg | ORAL_TABLET | Freq: Every day | ORAL | 2 refills | Status: DC

## 2024-05-14 NOTE — Progress Notes (Signed)
 [facility] MD Virtual Progress Note   Patient Location: Home Provider Location: Home Office  I connect with patient by video and verified that I am speaking with correct person by using two identifiers. I discussed the limitations of evaluation and management by telemedicine and the availability of in person appointments. I also discussed with the patient that there may be a patient responsible charge related to this service. The patient expressed understanding and agreed to proceed.  [name] Brown [id number] 73 y.o.  05/14/2024 8:53 AM  History of Present Illness:  Patient is evaluated by video session.  On the last visit we increased Wellbutrin  and he is taking 300 mg which he feel helping his anxiety and depression.  We also referred him to see Dr. Corina for psychological evaluation as patient believe he had underlying autism spectrum disorder.  He was not able to get appointment with Dr. Corina but on the same practice he is going to see Dr. Cathren in first week of October.  So far no major concern from increased dose of Wellbutrin .  He is sleeping at least 7 to 8 hours per have dreams about his past related to school and work.  He struggles with attention, focus and repetitive behaviors.  He is repairing the guitar but have difficulty learning new music.  He does go outside and now he has a Systems analyst and plan is to attend at least twice a week.  He feels increased dose of Wellbutrin  helps the depression and anxiety.  He denies any major panic attack.  He admitted few pounds weight gain as not as active and not walking because his wife has ankle pain and may require surgery.  Patient lives with his wife and he has no children.  He is close to his sister and her husband.  Patient has plan to go to beach next week with their wife.  Past Psychiatric History: No history of inpatient psychosis, mania, suicidal attempt.  History of anxiety, difficulty in attention and focus  but never tested for ADD.  Seen by Dr. Corina in inpatient rehab and suggested antidepressant.  History of heavy drinking but no withdrawals, seizures or blackout.   Past Medical History:  Diagnosis Date   Aortic stenosis    Ascending aortic aneurysm (HCC) 09/18/2018   Colon cancer (HCC)    Family history of adverse reaction to anesthesia    pt states his mother had 2 episodes of hyponatremia following anesthesia    GERD (gastroesophageal reflux disease)    Headache    Heart murmur    Hyperlipidemia 10/25/2016   Hypertension    pt is currently not taking any medications; pt states is borderline   Melanoma (HCC)    OSA on CPAP 09/18/2018   PAF (paroxysmal atrial fibrillation) (HCC) 06/13/2017   Perforated sigmoid colon (HCC)    PVC's (premature ventricular contractions) 10/25/2016   Tinnitus    Wears glasses     Outpatient Encounter Medications as of 05/14/2024  Medication Sig   acetaminophen  (TYLENOL ) 325 MG tablet Take 1-2 tablets (325-650 mg total) by mouth every 6 (six) hours as needed for mild pain (pain score 1-3).   apixaban  (ELIQUIS ) 5 MG TABS tablet Take 1 tablet (5 mg total) by mouth 2 (two) times daily.   aspirin  81 MG chewable tablet Chew 81 mg by mouth daily.   Azelaic Acid 15 % gel Apply 1 Application topically 2 (two) times daily.   buPROPion  (WELLBUTRIN  XL) 300 MG 24 hr tablet Take  1 tablet (300 mg total) by mouth daily.   cyclobenzaprine  (FLEXERIL ) 5 MG tablet Take 1 tablet (5 mg total) by mouth 3 (three) times daily as needed for muscle spasms.   diltiazem  (CARDIZEM  CD) 120 MG 24 hr capsule Take 1 capsule (120 mg total) by mouth at bedtime.   ezetimibe  (ZETIA ) 10 MG tablet TAKE 1 TABLET BY MOUTH DAILY   famotidine  (PEPCID ) 20 MG tablet Take 20 mg by mouth daily as needed for heartburn or indigestion.   levothyroxine  (SYNTHROID ) 100 MCG tablet Take 1 tablet (100 mcg total) by mouth daily.   losartan  (COZAAR ) 100 MG tablet Take 1 tablet (100 mg total) by mouth  daily.   Multiple Vitamin (MULTIVITAMIN WITH MINERALS) TABS tablet Take 1 tablet by mouth daily.   pravastatin  (PRAVACHOL ) 20 MG tablet Take 1 tablet (20 mg total) by mouth at bedtime.   rOPINIRole  (REQUIP ) 0.25 MG tablet Take 3 tablets (0.75 mg total) by mouth at bedtime.   sildenafil  (VIAGRA ) 100 MG tablet Take 1 tablet (100 mg total) by mouth daily as needed for erectile dysfunction.   XIIDRA 5 % SOLN Place 1 Dose into both eyes 2 (two) times daily.   No facility-administered encounter medications on file as of 05/14/2024.    No results found for this or any previous visit (from the past 2160 hours).   Psychiatric Specialty Exam: Physical Exam  Review of Systems  Weight 188 lb (85.3 kg).There is no height or weight on file to calculate BMI.  General Appearance: Casual  Eye Contact:  Fair  Speech:  Clear and Coherent  Volume:  Normal  Mood:  Euthymic  Affect:  Congruent  Thought Process:  Goal Directed  Orientation:  Full (Time, Place, and Person)  Thought Content:  Rumination  Suicidal Thoughts:  No  Homicidal Thoughts:  No  Memory:  Immediate;   Good Recent;   Good Remote;   Good  Judgement:  Intact  Insight:  Present  Psychomotor Activity:  Normal  Concentration:  Concentration: Fair and Attention Span: Fair  Recall:  Good  Fund of Knowledge:  Good  Language:  Good  Akathisia:  No  Handed:  Right  AIMS (if indicated):     Assets:  Communication Skills Desire for Improvement Housing Social Support Talents/Skills Transportation  ADL's:  Intact  Cognition:  WNL  Sleep:  7-8 hrs       01/18/2024    1:06 PM 01/11/2024   10:53 AM 12/12/2023   10:20 AM 11/30/2023   10:39 AM 11/02/2023   10:58 AM  Depression screen PHQ 2/9  Decreased Interest 1 1 1 1 1   Down, Depressed, Hopeless 1 1 1 1 1   PHQ - 2 Score 2 2 2 2 2   Altered sleeping 2 0 0 1 1  Tired, decreased energy 2 1 0 0 1  Change in appetite 0 0 0 1 0  Feeling bad or failure about yourself  1 1 0 0 1  Trouble  concentrating 3 0 1 0 0  Moving slowly or fidgety/restless 0 0 0 0 0  Suicidal thoughts 0 0 0 0 0  PHQ-9 Score 10 4 3 4 5   Difficult doing work/chores  Somewhat difficult Not difficult at all Somewhat difficult Somewhat difficult    Assessment/Plan: ADHD (attention deficit hyperactivity disorder), inattentive type - Plan: buPROPion  (WELLBUTRIN  XL) 300 MG 24 hr tablet  Anxiety - Plan: buPROPion  (WELLBUTRIN  XL) 300 MG 24 hr tablet  Patient is 73 year old man with history of  AV replacement, colon cancer, right colectomy, CAD, hypothyroidism, hypertension, sleep apnea, dry eye and history of fractured femur status post rehab.  Discussed current medications since adjustment of the Wellbutrin .  Doing better and tolerating very well.  Emphasize about exercise, going to gym.  He has appointment to see Dr. Sheria in first week of October for evaluation related to his underlying diagnosis.  He had difficulty doing multitasking, repetitive behavior and not learning music.  He is hoping the testing may shows underlying psychological disorder like autism spectrum.  Recommended to call back if is any question or any concern.  Will follow-up in 3 months.   Follow Up Instructions:     I discussed the assessment and treatment plan with the patient. The patient was provided an opportunity to ask questions and all were answered. The patient agreed with the plan and demonstrated an understanding of the instructions.   The patient was advised to call back or seek an in-person evaluation if the symptoms worsen or if the condition fails to improve as anticipated.    Collaboration of Care: Other provider involved in patient's care AEB notes are available in epic to review  Patient/Guardian was advised Release of Information must be obtained prior to any record release in order to collaborate their care with an outside provider. Patient/Guardian was advised if they have not already done so to contact the  registration department to sign all necessary forms in order for us  to release information regarding their care.   Consent: Patient/Guardian gives verbal consent for treatment and assignment of benefits for services provided during this visit. Patient/Guardian expressed understanding and agreed to proceed.     Total encounter time 18 minutes which includes face-to-face time, chart reviewed, care coordination, order entry and documentation during this encounter.   Note: This document was prepared by [employer] voice dictation technology and any errors that results from this process are unintentional.    Leni ONEIDA Client, MD 05/14/2024

## 2024-06-04 NOTE — Progress Notes (Unsigned)
 [name] Brown

## 2024-06-04 NOTE — Patient Instructions (Incomplete)
 Please continue using your CPAP regularly. While your insurance requires that you use CPAP at least 4 hours each night on 70% of the nights, I recommend, that you not skip any nights and use it throughout the night if you can. Getting used to CPAP and staying with the treatment long term does take time and patience and discipline. Untreated obstructive sleep apnea when it is moderate to severe can have an adverse impact on cardiovascular health and raise her risk for heart disease, arrhythmias, hypertension, congestive heart failure, stroke and diabetes. Untreated obstructive sleep apnea causes sleep disruption, nonrestorative sleep, and sleep deprivation. This can have an impact on your day to day functioning and cause daytime sleepiness and impairment of cognitive function, memory loss, mood disturbance, and problems focussing. Using CPAP regularly can improve these symptoms.  We will update supply orders, today. Continue ropinirole  0.75mg  at bedtime. May take 0.25mg  around 5p and 0.5mg  at bedtime.   Follow up in 1 year

## 2024-06-04 NOTE — Progress Notes (Unsigned)
 PATIENT: [name] Brown DOB: Aug 18, 1951  REASON FOR VISIT: follow up HISTORY FROM: patient  No chief complaint on file.    HISTORY OF PRESENT ILLNESS:  06/04/24 ALL:  [name] Brown returns for follow up for OSA on CPAP. HST 02/2024 showed moderate obstructive sleep apnea with a total AHI of 16.2/hour (utilizing the 4% desaturation criteria for obstructive hypopneas per Medicare guidelines) and O2 nadir of 83%. New AutoPAP orders placed. Since, he reports doing well with new machine.   He continues ropinirole  0.75mg  at bedtime. RLS remains well managed.   01/22/2024 ALL:  [name] Brown returns for follow up for OSA on CPAP and RLS. He continues to do well. He is using therapy nightly for about 8-9 hours, on average. He denies concerns with machine or supplies. He is eligible for a new machine. He continues ropinirole  0.75mg  at bedtime. RLS remains well managed.     11/01/2022 ALL:  [name] Brown returns for follow up for OSA on CPAP and RLS. He continues ropinirole  0.75mg  daily. He reports doing well. No significant changes. He has continued taking all three tablets of ropinirole  at the same time. He does not feel symptoms are bad enough to adjust dosing. He continues CPAP nightly. No concerns with machine or supplies. He is sleeping well. He is having more fatigue. He is going to the gym but not sure he is making any progress. He feels less motivated. He is on levothyroxine  and reports TSH has steadily increased over past few evaluations. He is followed closely by PCP.     10/28/2021 ALL: [name] Brown is a 73 y.o. male here today for follow up for OSA on CPAP and RLS. He continues ropinirole   0.75mg  at bedtime. He feels this works well to manage symptoms at night. He does have some discomfort in the evenings before bedtime. He is tolerating meds well with no adverse effects. He continues consistent use of CPAP. He is using a F20 FFM. It does push against his lower eyes but has tried multiple other masks that were  not a good fit for him. He is followed by ophthalmology. He is using Restasis and eye drops for dry eyes.     HISTORY: (copied from Dr Obie previous note)  [name] Brown as a 73 year old right-handed gentleman with an underlying medical history of aortic stenosis with heart murmur, hypertension, hyperlipidemia, history of melanoma, history of paroxysmal A. fib, reflux disease, history of perforated sigmoid colon, history of PVCs, tinnitus, and mildly overweight state, who presents for follow-up consultation of his RLS and OSA, on treatment with CPAP therapy. The patient is unaccompanied today and presents for his yearly check up.  I last saw him on 09/18/2019, at which time he was fully compliant with his CPAP.  He was able to tolerate Requip .  Requip  was helpful for his restless leg symptoms.  He was doing well with regards to his sleep apnea treatment with CPAP.  He was advised to follow-up routinely in 1 year.  He was advised to continue with ropinirole  0.75 mg each night.   Today, 09/22/2020: I reviewed his CPAP compliance data from the past 30 days from 08/22/2020 through 09/20/2020, during which time he used his machine every night with percent use days greater than 4 hours at 100%, indicating superb compliance with an average usage of 8 hours and 3 minutes, residual AHI at goal at 2.1/h, leak on the low side with a 95th percentile at 1.2 L/min on a pressure of 9 cm without EPR.  He reports doing well with his CPAP.  He is using a small mask, the medium was too large for him.  He is very consistent with the usage and does not mind using CPAP long-term.  Ropinirole  continues to help with his leg twitching and he takes it every night around 9 PM, he is in bed generally between 1030 and 11 PM.  He has noticed no side effects with the ropinirole , in particular, no significant swelling in the lower extremities, no sleepiness, no abnormal behaviors noted.  He sees his cardiologist twice a year and primary care  also twice a year.  He had an increase in his pravastatin .   REVIEW OF SYSTEMS: Out of a complete 14 system review of symptoms, the patient complains only of the following symptoms, RLS, fatigue and all other reviewed systems are negative.  ESS: 7 FSS: 37  ALLERGIES: No Known Allergies  HOME MEDICATIONS: Outpatient Medications Prior to Visit  Medication Sig Dispense Refill   acetaminophen  (TYLENOL ) 325 MG tablet Take 1-2 tablets (325-650 mg total) by mouth every 6 (six) hours as needed for mild pain (pain score 1-3).     apixaban  (ELIQUIS ) 5 MG TABS tablet Take 1 tablet (5 mg total) by mouth 2 (two) times daily. 60 tablet 3   aspirin  81 MG chewable tablet Chew 81 mg by mouth daily.     Azelaic Acid 15 % gel Apply 1 Application topically 2 (two) times daily.     buPROPion  (WELLBUTRIN  XL) 300 MG 24 hr tablet Take 1 tablet (300 mg total) by mouth daily. 30 tablet 2   cyclobenzaprine  (FLEXERIL ) 5 MG tablet Take 1 tablet (5 mg total) by mouth 3 (three) times daily as needed for muscle spasms. 60 tablet 0   diltiazem  (CARDIZEM  CD) 120 MG 24 hr capsule Take 1 capsule (120 mg total) by mouth at bedtime.     ezetimibe  (ZETIA ) 10 MG tablet TAKE 1 TABLET BY MOUTH DAILY 90 tablet 1   famotidine  (PEPCID ) 20 MG tablet Take 20 mg by mouth daily as needed for heartburn or indigestion.     levothyroxine  (SYNTHROID ) 100 MCG tablet Take 1 tablet (100 mcg total) by mouth daily. 90 tablet 1   losartan  (COZAAR ) 100 MG tablet Take 1 tablet (100 mg total) by mouth daily. 90 tablet 3   Multiple Vitamin (MULTIVITAMIN WITH MINERALS) TABS tablet Take 1 tablet by mouth daily.     pravastatin  (PRAVACHOL ) 20 MG tablet TAKE 1 TABLET BY MOUTH AT BEDTIME 90 tablet 0   rOPINIRole  (REQUIP ) 0.25 MG tablet Take 3 tablets (0.75 mg total) by mouth at bedtime. 270 tablet 3   sildenafil  (VIAGRA ) 100 MG tablet Take 1 tablet (100 mg total) by mouth daily as needed for erectile dysfunction. 30 tablet 5   XIIDRA 5 % SOLN Place 1  Dose into both eyes 2 (two) times daily.     No facility-administered medications prior to visit.    PAST MEDICAL HISTORY: Past Medical History:  Diagnosis Date   Aortic stenosis    Ascending aortic aneurysm 09/18/2018   Colon cancer [facility])    Family history of adverse reaction to anesthesia    pt states his mother had 2 episodes of hyponatremia following anesthesia    GERD (gastroesophageal reflux disease)    Headache    Heart murmur    Hyperlipidemia 10/25/2016   Hypertension    pt is currently not taking any medications; pt states is borderline   Melanoma (HCC)    OSA  on CPAP 09/18/2018   PAF (paroxysmal atrial fibrillation) (HCC) 06/13/2017   Perforated sigmoid colon (HCC)    PVC's (premature ventricular contractions) 10/25/2016   Tinnitus    Wears glasses     PAST SURGICAL HISTORY: Past Surgical History:  Procedure Laterality Date   AORTIC VALVE REPLACEMENT N/A 06/08/2017   Procedure: AORTIC VALVE REPLACEMENT (AVR);  Surgeon: Lucas Dorise POUR, MD;  Location: [facility];  Service: Open Heart Surgery;  Laterality: N/A;  Using 25mm Perimount Magna Ease Pericardial Bioprosthesis Aortic Valve   COLOSTOMY CLOSURE N/A 08/18/2016   Procedure: COLOSTOMY CLOSURE OPEN PROCEDURE;  Surgeon: Krystal Spinner, MD;  Location: [facility] ORS;  Service: General;  Laterality: N/A;   FEMUR IM NAIL Right 11/03/2023   Procedure: INTRAMEDULLARY (IM) NAIL FEMORAL;  Surgeon: Sherida Adine BROCKS, MD;  Location: [facility] OR;  Service: Orthopedics;  Laterality: Right;   FEMUR SURGERY Right    feb 2025   LAPAROTOMY N/A 04/25/2016   Procedure: EXPLORATORY LAPAROTOMY WITH SIGMOID COLECTOMY, COLOSTOMY;  Surgeon: Krystal Spinner, MD;  Location: [facility] ORS;  Service: General;  Laterality: N/A;   LEFT HEART CATH AND CORONARY ANGIOGRAPHY N/A 11/03/2016   Procedure: Left Heart Cath and Coronary Angiography;  Surgeon: Lonni JONETTA Cash, MD;  Location: [facility] INVASIVE CV LAB;  Service: Cardiovascular;  Laterality: N/A;   MELANOMA EXCISION      PARTIAL COLECTOMY Right 08/18/2016   Procedure: RIGHT COLECTOMY;  Surgeon: Krystal Spinner, MD;  Location: [facility] ORS;  Service: General;  Laterality: Right;   TEE WITHOUT CARDIOVERSION N/A 06/08/2017   Procedure: TRANSESOPHAGEAL ECHOCARDIOGRAM (TEE);  Surgeon: Lucas Dorise POUR, MD;  Location: [facility] OR;  Service: Open Heart Surgery;  Laterality: N/A;    FAMILY HISTORY: Family History  Problem Relation Age of Onset   Dementia Mother    Kidney disease Mother    Vascular Disease Mother    CAD Mother    Heart attack Father    Alcohol abuse Father    Cirrhosis Father    Stroke Maternal Grandmother    Stroke Paternal Grandmother    Sleep apnea Neg Hx     SOCIAL HISTORY: Social History   Socioeconomic History   Marital status: Married    Spouse name: Not on file   Number of children: Not on file   Years of education: Not on file   Highest education level: Professional school degree (e.g., MD, DDS, DVM, JD)  Occupational History   Occupation: Retired Teacher, early years/pre  Tobacco Use   Smoking status: Never   Smokeless tobacco: Never  Vaping Use   Vaping status: Never Used  Substance and Sexual Activity   Alcohol use: Yes    Alcohol/week: 1.0 standard drink of alcohol    Types: 1 Glasses of wine per week   Drug use: No   Sexual activity: Yes  Other Topics Concern   Not on file  Social History Narrative   Pt lives in [place] with his wife, they are active.      Pt lives at home with his wife.      1 Cat    Retired    Teacher, early years/pre Strain: Low Risk  (04/05/2024)   Overall Financial Resource Strain (CARDIA)    Difficulty of Paying Living Expenses: Not hard at all  Food Insecurity: No Food Insecurity (04/05/2024)   Hunger Vital Sign    Worried About Running Out of Food in the Last Year: Never true    Ran Out of Food in the Last  Year: Never true  Transportation Needs: No Transportation Needs (04/05/2024)   PRAPARE - Scientist, research (physical sciences) (Medical): No    Lack of Transportation (Non-Medical): No  Physical Activity: Insufficiently Active (04/05/2024)   Exercise Vital Sign    Days of Exercise per Week: 4 days    Minutes of Exercise per Session: 30 min  Stress: No Stress Concern Present (04/05/2024)   [employer] of Occupational Health - Occupational Stress Questionnaire    Feeling of Stress: Only a little  Social Connections: Moderately Isolated (04/05/2024)   Social Connection and Isolation Panel    Frequency of Communication with Friends and Family: Twice a week    Frequency of Social Gatherings with Friends and Family: Twice a week    Attends Religious Services: Never    Database administrator or Organizations: No    Attends Engineer, structural: Not on file    Marital Status: Married  Catering manager Violence: Not At Risk (12/12/2023)   Humiliation, Afraid, Rape, and Kick questionnaire    Fear of Current or Ex-Partner: No    Emotionally Abused: No    Physically Abused: No    Sexually Abused: No     PHYSICAL EXAM  There were no vitals filed for this visit.    There is no height or weight on file to calculate BMI.  Generalized: Well developed, in no acute distress  Cardiology: normal rate and rhythm, no murmur noted Respiratory: clear to auscultation bilaterally  Neurological examination  Mentation: Alert oriented to time, place, history taking. Follows all commands speech and language fluent Cranial nerve II-XII: Pupils were equal round reactive to light. Extraocular movements were full, visual field were full  Motor: The motor testing reveals 5 over 5 strength of all 4 extremities. Good symmetric motor tone is noted throughout.  Gait and station: Gait is normal.    DIAGNOSTIC DATA (LABS, IMAGING, TESTING) - I reviewed patient records, labs, notes, testing and imaging myself where available.     03/28/2018    8:17 AM  MMSE - Mini Mental State Exam  Orientation to time 5   Orientation to Place 5  Registration 3  Attention/ Calculation 5  Recall 3  Language- name 2 objects 2  Language- repeat 1  Language- follow 3 step command 3  Language- read & follow direction 1  Write a sentence 1  Copy design 1  Total score 30     Lab Results  Component Value Date   WBC 6.5 01/11/2024   HGB 15.6 01/11/2024   HCT 47.0 01/11/2024   MCV 95.5 01/11/2024   PLT 183.0 01/11/2024      Component Value Date/Time   NA 139 01/11/2024 1120   NA 138 07/13/2023 0946   K 4.7 01/11/2024 1120   CL 103 01/11/2024 1120   CO2 28 01/11/2024 1120   GLUCOSE 88 01/11/2024 1120   BUN 16 01/11/2024 1120   BUN 12 07/13/2023 0946   CREATININE 0.89 01/11/2024 1120   CREATININE 1.04 10/25/2016 1547   CALCIUM  9.7 01/11/2024 1120   PROT 6.8 01/11/2024 1120   PROT 6.8 07/13/2023 0946   ALBUMIN  4.4 01/11/2024 1120   ALBUMIN  4.4 07/13/2023 0946   AST 21 01/11/2024 1120   ALT 24 01/11/2024 1120   ALKPHOS 142 (H) 01/11/2024 1120   BILITOT 0.4 01/11/2024 1120   BILITOT 0.8 07/13/2023 0946   GFRNONAA >60 11/16/2023 0548   GFRAA 77 09/01/2020 0925   Lab Results  Component Value Date   CHOL 150 01/11/2024   HDL 53.60 01/11/2024   LDLCALC 55 01/11/2024   LDLDIRECT 105.0 05/02/2023   TRIG 211.0 (H) 01/11/2024   CHOLHDL 3 01/11/2024   Lab Results  Component Value Date   HGBA1C 5.5 05/03/2023   Lab Results  Component Value Date   VITAMINB12 347 05/30/2018   Lab Results  Component Value Date   TSH 2.51 01/11/2024     ASSESSMENT AND PLAN 73 y.o. year old male  has a past medical history of Aortic stenosis, Ascending aortic aneurysm (09/18/2018), Colon cancer (HCC), Family history of adverse reaction to anesthesia, GERD (gastroesophageal reflux disease), Headache, Heart murmur, Hyperlipidemia (10/25/2016), Hypertension, Melanoma (HCC), OSA on CPAP (09/18/2018), PAF (paroxysmal atrial fibrillation) (HCC) (06/13/2017), Perforated sigmoid colon (HCC), PVC's (premature  ventricular contractions) (10/25/2016), Tinnitus, and Wears glasses. here with   No diagnosis found.   Morty Ortwein is doing well on CPAP therapy. Compliance report reveals excellent compliance. He was encouraged to continue using CPAP nightly and for greater than 4 hours each night. We will update supply orders as indicated. Risks of untreated sleep apnea review and education materials provided. We have ordered HST for eval. Will order new machine pending results. He will continue ropinirole  0.75mg  at bedtime. May take 0.25mg  at dinner and 0.5mg  at bedtime if needed. Healthy lifestyle habits encouraged. He will follow up 31-90 days following receipt of new machine. He verbalizes understanding and agreement with this plan.    No orders of the defined types were placed in this encounter.    No orders of the defined types were placed in this encounter.    I spent 30 minutes of face-to-face and non-face-to-face time with patient.  This included previsit chart review, lab review, study review, order entry, electronic health record documentation, patient education.   Greig Forbes, FNP-C 06/04/2024, 8:33 AM [facility] [address] [place], [place] [address] [phone]

## 2024-06-05 ENCOUNTER — Ambulatory Visit (INDEPENDENT_AMBULATORY_CARE_PROVIDER_SITE_OTHER): Admitting: Family Medicine

## 2024-06-05 ENCOUNTER — Encounter: Payer: Self-pay | Admitting: Family Medicine

## 2024-06-05 VITALS — BP 117/72 | HR 80 | Ht 66.0 in | Wt 186.5 lb

## 2024-06-05 DIAGNOSIS — G2581 Restless legs syndrome: Secondary | ICD-10-CM

## 2024-06-05 DIAGNOSIS — G4733 Obstructive sleep apnea (adult) (pediatric): Secondary | ICD-10-CM | POA: Diagnosis not present

## 2024-06-06 ENCOUNTER — Other Ambulatory Visit: Payer: Self-pay

## 2024-06-06 DIAGNOSIS — E039 Hypothyroidism, unspecified: Secondary | ICD-10-CM

## 2024-06-06 MED ORDER — LEVOTHYROXINE SODIUM 100 MCG PO TABS: 100.0000 ug | ORAL_TABLET | Freq: Every day | ORAL | 1 refills | Status: AC

## 2024-06-06 NOTE — Progress Notes (Signed)
 Fax received requesting refill on medication, noted as appropriate sent refill electronically

## 2024-06-08 DIAGNOSIS — Z23 Encounter for immunization: Secondary | ICD-10-CM | POA: Diagnosis not present

## 2024-06-27 ENCOUNTER — Encounter: Attending: Psychology | Admitting: Psychology

## 2024-06-27 DIAGNOSIS — R41840 Attention and concentration deficit: Secondary | ICD-10-CM | POA: Diagnosis not present

## 2024-06-27 NOTE — Progress Notes (Signed)
 NEUROPSYCHOLOGICAL EVALUATION [facility]  Physical Medicine and Rehabilitation     Patient: [name] Brown  MRN: [id number] DOB: 20-May-1951  Age: 73 y.o. Sex: male  Race/Ethnicity: White or Caucasian  Years of Education: 16 Handedness: Right  Referring Provider: Curry Leni DASEN, MD  Provider/Clinical Neuropsychologist: Evalene DOROTHA Riff, PsyD  Date of Service: 06/27/2024 Start Time: 10:15 AM End Time: 12:50 PM  Location of Service:  [facility] Physical Medicine & Rehabilitation Department [place] [address] [place], [place] [address] Phone: [phone]  Billing Code/Service:            [phone]  Individuals Present: Patient was seen unaccompanied, in-person, by the provider. 95 minutes spent in face-to-face clinical interview and remaining 60 minutes was spent in initial and comprehensive record review, documentation, and testing protocol construction.    PATIENT CONSENT AND CONFIDENTIALITY The patient's understanding of the reason for referral was intact. Discussed limits of confidentiality including, but not limited to, posting of final evaluation report in the patient's electronic medical record for both the patient and for the referring provider and appropriate medical professionals. Patient was given the opportunity to have their questions answered. The neuropsychological evaluation process was discussed with the patient and they consented to proceed with the evaluation.  Consent for Evaluation and Treatment: Signed: Yes Explanation of Privacy Policies: Signed: Yes Discussion of Confidentiality Limits: Yes  REASON FOR REFERRAL: The patient was referred for neuropsychological evaluation for concerns of underlying Autism Spectrum Disorder (ASD) and Attention-Deficit/Hyperactivity Disorder (ADHD). The patient is a 73 year old man currently under care of of psychiatry, initially referred there by  psychologist Dr. Corina who met with the patient for neuropsychological consultation within the context of comprehensive inpatient rehabilitation for recovery from right femur fracture in 11/03/2023.  Per Dr. Darden note, the patient reported a history of anxiety and depression type symptoms, obsessive-compulsive disorder, suspicion of mild ASD and difficulties with motivation. Referring provider records indicate possible ADHD diagnosis as well, but need for formal psychological testing to confirm.   Upon interview, the patient indicated he was interested in undergoing the evaluation to clarify whether his suspicions for having ASD and ADHD are correct, as it would help explain life-long tendencies and challenges he has experienced.   BACKGROUND & PRESENTING CONCERNS:   Family & Developmental History: The patient indicated he was not aware of any complications during pregnancy or issues with developmental milestones.The patient was born and raised by his biological parents in [place] . He lived there until the age of 17 when his family moved to [place]. He is the eldest of three siblings. He indicated his environment growing up was adequate in terms of meeting physical needs, but emotional support and structure limited.  He reported having some suspicion of ASD in the family, but no formal diagnosis have been made.    Educational / Vocational History: His grades were okay in early grade school, although did have some social difficulties and indications of problems with organization. The patient reported finding old report cards and communications between his mother and teacher that included comments that he seemed to have no concept of time and missed deadlines seemingly without awareness. He described having limited social relationships, spending free-time alone. The patient denied any indications of specific learning disorders, but endorsed difficulties with attention and  concentration and in the 5th grade I stopped doing homework. I didn't see the point. He denied any significant environmental changes or precipitating events.  The patient indicated that he moved to [facility] in the 8th grade, and that this was a challenging transition. The school was much larger and he struggled to integrate. He experienced bullying in the first two years and had minimal social connections. He described ongoing difficulties with attention and concentration, and avoidance of homework. He enjoyed reading and read well above grade level. He indicated that starting around his teenage years he realized he wasn't achieving like everyone else. He had to repeat the 11th, which he attributes to not doing the work. He described difficulty with motivation, particularly when not seeing a practical application for certain studies.   He graduated high school, worked for one year before attending [employer] for one semester. He struggled, possible due to never having learned study skills. 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 He indicated working with tools and wood was an area of strength. He also described long-running fascination with wood. He later applied to go back to school but wasn't accepted. He spent time with a cousin who he learned study habits from and completed a year at [employer] where he earned a 4.0. 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 He then applied to pharmacy school and was admitted to [employer] where he graduated with a B.S.   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 He did not enjoy the retail aspect of the work, and gravitated towards the more technical and computing/systems aspects of the work. For a period, he indicated he worked night shifts. He indicated he  preferred to work night shifts as I didn't have to deal with as many people. He struggled with certain aspects of pharmacy work, particularly those he found less interesting, but had an intense interest in the infrastructure aspect of the work, warden/ranger, and excelled. 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 He traveled to different locations and provided support and training for implementation of such systems. He described having difficulties with certain aspects of pharmacy work, others tending to out perform him for things like number of orders processed. He indicated that his productivity and career progression were likely limited by difficulties with motivation and poor engagement when tasks didn't interest him. That being said, his skills were recognized and promoted on several occasions.   Psychiatric History: The patient described experiencing trauma linked to the two years of bulling he experienced when moving to [place]. He denied any clear indications of PTSD symptomology at present. He indicated that he began to experience some difficulties with worry/anxiety around his teenage years that he linked to realization that he was not performing as well as his peers. He indicated that he believes he struggled with depression throughout his life, recalling indications dating back to the first grade. He was sent to a psychiatrist when in high school by his parents because they didn't know what to do with me but did not find it helpful. He had no other interactions with mental health treatment until recently.   ASD Symptomology: Information was obtained through detailed clinical interview. The patient expressed reservations regarding his spouse being contacted for collateral interview and explained the rationale for this, and the provider expressed understanding and indicated his spouse  would not be  contacted.   The patient's descriptions indicate life-long deficits in social communication and interaction that were not situationally bound and which appear consistent with a neurodevelopmental etiology. The patient described having significant difficulty judging the emotional state of others. He reported difficulties with assessing the emotional state of others in general. He reported significant difficulty with judgment of the emotional state of others based on nonverbal behaviors/facial expression. He reported recognizing that he may be different from others in that I don't feel other people's moods, and that he doesn't do empathy well. He indicated that he struggles in social settings and has had to learn or train himself with respect to social behavior (I.e., maintaining eye contact). He reported ongoing difficulties with judgment about engaging with others socially. Behavioral observations during the clinical interview were consistent indications of deficits in expressive nonverbal communications. For example, use of eye contact was atypical and appeared more determine by choice versus typical conversational rhythm. The patient's facial expression and affect in general was flat, but not due to depression. Body language was similarly restricted. The patient described significant and life-long difficulties with social engagements, maintaining social relationships, adjusting to different social contexts. He described predominantly solitary activities for leisure, and primary interest focused on science and technical work. He described a lifelong fascination with different woods. He described some possible indications of hyperreactivity with aversion to certain sounds.   ADHD Symptomology: DIVA-5 (Diagnostic Interview for ADHD in Adults) is utilized in conjunction with broader in-depth diagnostic clinical interview to support differential diagnosis.  -Symptoms indicated as currently present (adult):  Present at least 6 months but also appear chronic/trait-like. Symptoms negatively impact functioning. Symptoms are not better explained by another mental disorder. -Symptoms indicated as present during childhood: Present by 73 years of age, are inconsistent with developmental level, negatively impact functioning, and are not better explained by another mental disorder.  Attention-Deficit Symptoms: Often difficulties with.. Current Childhood  A1a Attention to detail/careless mistakes:  Yes Yes  A1b Sustained attention:  Yes Yes  A1c Attending when addressed directly:  Yes Yes  A1d Follow through on instructions / tasks:   Yes Yes  A1e Organizational difficulty:  Yes Yes  A20f Aversion / avoidance of sustained mental effort:  Yes Yes  A1g Losing things necessary for tasks/activities:  Yes Yes  A1h Easily distracted by extraneous stimuli:  Yes Yes  A1i Forgetful in daily activities:  Yes Yes      Hyperactivity/Impulsivity Symptoms: Often difficulties with.    A2a Fidgeting/squirming in seat: Yes Yes  A2b Remaining seated / needing to move around: No No  A2c Restlessness/runs or climbs when inappropriate:  No No  A2d Engaging in leisure activities quietly: ** No No  A2e Excessive energy:  No No  A62f Talking excessively:  No No  A2g Blurting out answers / interrupting others:  No No  A2h Waiting turn:  No No  A2i Interruption or socially intrusive: No No  Functional Impact: Academic and professional achievement negatively impacted. Difficulties with efficiency and productivity at home.    Recent Cognitive Changes:  Memory: None  Processing Speed: None  Attention & Concentration: None  Language: None  Visual-Spatial: None  Executive Functioning: None  Motor/Sensory Complaints:  Sensory changes: Nearsighted and other known conditions negatively impacting vision. Wears glasses. Some declines in hearing.  Balance/coordination difficulties:None Frequent instances of dizziness/vertigo:  Occasional and of unclear etiology. Not postural. Other motor difficulties: Essential tremors.  Emotional and Behavioral Functioning:  History of depression  and anxiety which is currently treated via medication. He established care with psychiatry following recommendations from inpatient neuropsychology this year. He reported that he had difficulties with depression from an early age. He indicated that this may have been related to limited support in his home environment growing up. Difficulties with anxiety emerged during the patient's teenage years.  He indicated he realized he was not performing in line with his peers and was also recognizing differences in himself socially relative to others.  He described himself struggling considerably with remembering faces and that this was a big challenge for him in school. Depression: Reported feeling that he had mild depression, difficulties with motivation, slight reduction or variability in enjoyment of pleasurable activities. Energy is slightly low as well.  Anxiety: Denies any problems with excessive worry at present. No panic attacks. Stress is reportedly minimal.  Other: Denies any active PTSD symptoms. No current or past difficulties with hallucination, paranoia, symptoms of mania, suicidal ideation, homicidal ideation.  Sleep: No difficulties with sleep onset or maintenance.  Sleeps approximately 8 to 8-1/2 hours per night.  No indications of unusual behavior while asleep. Appetite: Good. Caffeine: Two large coffees per day. Alcohol Use: 2 to 3 glasses of wine per night, for many years.  Tobacco Use: None Recreational Substance Use: None  Level of Functional Independence: The patient is intact with basic and instrumental activities of daily living.   Medical History/Record Review: Per records and patient report, History of traumatic brain injury/concussion: No History of stroke: No History of heart attack: No History of cancer/chemotherapy:  No History of seizure activity: No Symptoms of chronic pain: No Experience of frequent headaches/migraines: No  Imaging/Lab Results: Per records, STUDY DATE: 06/18/2018 PATIENT NAME: Leodis Alcocer DOB: 1951-08-05 MRN: [id number] EXAM: MRI Brain with and without contrast ORDERING CLINICIAN: True Mar, MD, PhD CLINICAL HISTORY: 73 year old man with dizziness and reduced balance COMPARISON FILMS: None TECHNIQUE:MRI of the brain with and without contrast was obtained utilizing 5 mm axial slices with T1, T2, T2 flair, SWI and diffusion weighted views.  T1 sagittal, T2 coronal and postcontrast views in the axial and coronal plane were obtained. CONTRAST: 15 ml Multihance  IMAGING SITE: [facility], [address] [place]. FINDINGS: On sagittal images, the spinal cord is imaged caudally to C2-C3 and is normal in caliber.   The contents of the posterior fossa are of normal size and position.   The pituitary gland and optic chiasm appear normal.    Brain volume appears normal for age.   The ventricles are normal in size for age and without distortion.  There are no abnormal extra-axial collections of fluid.   The cerebellum and brainstem appears normal.   The deep gray matter appears normal.  The cerebral hemispheres appear normal for age.   Diffusion weighted images are normal.  Susceptibility weighted images are normal.    The orbits appear normal.   The VIIth/VIIIth nerve complex appears normal.  The mastoid air cells appear normal.  The paranasal sinuses appear normal.  Flow voids are identified within the major intracerebral arteries.  The left vertebral artery is dominant. After the infusion of contrast material, a normal enhancement pattern is noted. IMPRESSION: This is a normal age-appropriate MRI of the brain with and without contrast.  Past Medical History:  Diagnosis Date   Aortic stenosis    Ascending aortic aneurysm 09/18/2018   Colon cancer [facility])    Family history of adverse  reaction to anesthesia    pt states his mother  had 2 episodes of hyponatremia following anesthesia    GERD (gastroesophageal reflux disease)    Headache    Heart murmur    Hyperlipidemia 10/25/2016   Hypertension    pt is currently not taking any medications; pt states is borderline   Melanoma (HCC)    OSA on CPAP 09/18/2018   PAF (paroxysmal atrial fibrillation) (HCC) 06/13/2017   Perforated sigmoid colon (HCC)    PVC's (premature ventricular contractions) 10/25/2016   Tinnitus    Wears glasses    Patient Active Problem List   Diagnosis Date Noted   Gastrointestinal problem 04/05/2024   Erectile dysfunction 04/05/2024   DVT (deep venous thrombosis) (HCC) 12/03/2023   RLS (restless legs syndrome) 11/17/2023   Obsessive compulsive disorder 11/13/2023   Fracture, proximal femur, right, sequela 11/07/2023   Obesity (BMI 30-39.9) 11/02/2023   Dysthymia 11/03/2022   Abnormal findings on diagnostic imaging of other parts of digestive tract 11/04/2021   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   Noninfective gastroenteritis and colitis, unspecified 11/04/2021   History of colonic polyps 11/04/2021   Screening for intestinal cancer 11/04/2021   Slow transit constipation 11/04/2021   SBO (small bowel obstruction) (HCC) 01/04/2021   Alcohol use 01/04/2021   Macular edema 10/22/2020   Hypothyroid 09/24/2018   OSA on CPAP 09/18/2018   S/P AVR (aortic valve replacement) 06/08/2017   Non-ischemic cardiomyopathy (HCC)    Aortic stenosis with bicuspid valve 10/25/2016   PVC's (premature ventricular contractions) 10/25/2016   Hyperlipidemia 10/25/2016   Coronary artery disease involving native coronary artery of native heart without angina pectoris 09/23/2016   History of colon cancer, stage I 08/18/2016   Essential hypertension    Perforated sigmoid colon s/p colectomy/colostomy 04/26/2016 04/25/2016   Family Neurologic/Medical Hx:  Family History  Problem Relation Age of  Onset   Dementia Mother    Kidney disease Mother    Vascular Disease Mother    CAD Mother    Heart attack Father    Alcohol abuse Father    Cirrhosis Father    Stroke Maternal Grandmother    Stroke Paternal Grandmother    Sleep apnea Neg Hx     Medications:  aspirin  81 MG chewable tablet buPROPion  (WELLBUTRIN  XL) 300 MG 24 hr tablet diltiazem  (CARDIZEM  CD) 120 MG 24 hr capsule ezetimibe  (ZETIA ) 10 MG tablet famotidine  (PEPCID ) 20 MG tablet levothyroxine  (SYNTHROID ) 100 MCG tablet losartan  (COZAAR ) 100 MG tablet (Expired) Multiple Vitamin (MULTIVITAMIN WITH MINERALS) TABS tablet pravastatin  (PRAVACHOL ) 20 MG tablet rOPINIRole  (REQUIP ) 0.25 MG tablet sildenafil  (VIAGRA ) 100 MG tablet XIIDRA 5 % SOLN  Psychosocial: Marital Status: Married to his wife since 1978, Children/Grandchildren: None Living Situation: Alone with spouse.  Daily Activities/Hobbies: Builds guitars. Playing music. Outdoors. Interested in sciences.   Mental Status/Behavioral Observations: The patient was seen on an outpatient basis in the [facility] office for the clinical interview unaccompanied.  Sensorium/Arousal: Alert.  Hearing and vision are adequate for the purpose of the visit and the demands of future testing. Orientation: Full. Appearance: Appropriate dress and hygiene. Behavior: Cooperative and engaged. Speech/Language: Conversational speech was fluent and well-articulated. Speech was minimally prosodic. No difficulties in receptive language inferred based on responses to questions.  Motor: Ambulated independently without issue.  No tremor. Social Comportment: Patient behavior was polite and appropriate/well controlled. Patient social behavior was atypical with respect to nonverbal communication and affective expression. Nonverbal behavior showed minimal affective expression, although he was in no way unpleasant. Speech quality was similar. Eye contact varied,  and aligned with his description  of it as a practiced behavior rather than unconscious act in conversation.  Mood: Neutral Affect: Congruent Thought Process/Content: Coherent, linear and goal directed. Ability to Participate in Interview: Patient readily answered all questions with considerable detail regarding personal history. Insight: Good.  SUMMARY / CLINICAL IMPRESSIONS The patient was referred for neuropsychological evaluation for concerns of underlying Autism Spectrum Disorder (ASD) and Attention-Deficit/Hyperactivity Disorder (ADHD). The patient is a 73 year old man currently under care of of psychiatry, initially referred there by psychologist Dr. Corina who met with the patient for neuropsychological consultation within the context of comprehensive inpatient rehabilitation for recovery from right femur fracture in 11/03/2023.  Per Dr. Darden note, the patient reported a history of anxiety and depression type symptoms, obsessive-compulsive disorder, suspicion of mild ASD and difficulties with motivation. Referring provider records indicate possible ADHD diagnosis as well, but need for formal psychological testing to confirm.   Upon interview, the patient indicated he was interested in undergoing the evaluation to clarify whether his suspicions for having ASD and ADHD are correct, as it would help explain life-long tendencies and challenges he has experienced.  The patient's overall descriptions of current and past behavior are suggestive of high functioning ASD presentation which would previously have been classified as Asperger's disorder. The patient shows some distinct features suggestive of ADHD. Further cognitive evaluation will support differential diagnosis. Follow-up on certain areas of psychiatric functioning will also be conducted in follow-up interview during the feedback session to solidify differential diagnosis.   DISPOSITION / PLAN The patient has been set up for a formal neuropsychological assessment  to objectively assess her cognitive functioning across domains to establish the patient's cognitive profile. This data, in conjunction with information obtained via clinical interview and medical record review, will help clarify likely etiology and guide treatment recommendations. Once data collection and interpretation have been completed, the findings / diagnosis and recommendations will be reviewed and discussed with the patient during a feedback appointment with the neuropsychologist. Based on the collaborative dialogue with the patient during the feedback, recommendations may be adjusted / tailored as needed. A formal report will be produced and provided to the patient and the referring provider.   Diagnosis: Attention and concentration deficit    Evalene DOROTHA Riff, PsyD Clinical Neuropsychology [address] [place], [place] [address] Main: [phone] Fax: [phone]  This report was generated using voice recognition software. While this document has been carefully reviewed, transcription errors may be present. I apologize in advance for any inconvenience. Please contact me if further clarification is needed.

## 2024-07-10 ENCOUNTER — Encounter: Payer: Self-pay | Admitting: Psychology

## 2024-07-12 ENCOUNTER — Other Ambulatory Visit (HOSPITAL_BASED_OUTPATIENT_CLINIC_OR_DEPARTMENT_OTHER): Payer: Self-pay | Admitting: Cardiovascular Disease

## 2024-07-15 ENCOUNTER — Other Ambulatory Visit (HOSPITAL_BASED_OUTPATIENT_CLINIC_OR_DEPARTMENT_OTHER): Payer: Self-pay | Admitting: Cardiovascular Disease

## 2024-07-15 ENCOUNTER — Ambulatory Visit (HOSPITAL_BASED_OUTPATIENT_CLINIC_OR_DEPARTMENT_OTHER)
Admission: RE | Admit: 2024-07-15 | Discharge: 2024-07-15 | Disposition: A | Discharge status: Home / Self Care | Source: Ambulatory Visit | Attending: Family | Admitting: Family

## 2024-07-15 DIAGNOSIS — R918 Other nonspecific abnormal finding of lung field: Secondary | ICD-10-CM | POA: Diagnosis not present

## 2024-07-15 DIAGNOSIS — I7121 Aneurysm of the ascending aorta, without rupture: Secondary | ICD-10-CM | POA: Insufficient documentation

## 2024-07-15 LAB — POCT I-STAT CREATININE: Creatinine, Ser: 1.2 mg/dL (ref 0.61–1.24)

## 2024-07-15 MED ORDER — IOHEXOL 350 MG/ML SOLN
100.0000 mL | Freq: Once | INTRAVENOUS | Status: AC | PRN
  Administered 2024-07-15: 100 mL via INTRAVENOUS

## 2024-07-18 ENCOUNTER — Encounter: Attending: Psychology

## 2024-07-18 DIAGNOSIS — R41840 Attention and concentration deficit: Secondary | ICD-10-CM | POA: Diagnosis present

## 2024-07-18 NOTE — Progress Notes (Signed)
 Mental Status/Behavioral Observations (07/18/2024):  Sensory/Arousal: Hearing and vision were adequate for testing. The patient was alert. Appearance: Dress and hygiene were appropriate for the setting.   Speech/Language: In conversation, the patient's speech was prosodic, fluent, and well-articulated. The patient displayed no indications of word finding difficulties and no word substitution errors were observed.  Motor: The patient ambulated independently and without issue. A slight tremor was observed on the patient's right hand.  Social Comportment: Social behavior was appropriate to the setting. Mood/Affect: Mood was largely neutral. Affect was consistent with mood.  Attention/Concentration: The patient appeared to maintain consistent engagement throughout the testing session. No frank attentional lapses were observed.  Thought Process/Content: The patient's thought process was coherent, linear, goal directed. There were no indications of psychosis.  Additional Observations: The patient showed no difficulties with understanding task instructions. No difficulties with frustration tolerance were noted.  Neuropsychology Note Kaydence Baba completed 150 minutes of neuropsychological testing with technician, Josue Ned, BA, under the supervision of Evalene Riff, PsyD., Clinical Neuropsychologist. The patient did not appear overtly distressed by the testing session, per behavioral observation or via self-report to the technician. Rest breaks were offered.   Clinical Decision Making: In considering the patient's current level of functioning, level of presumed impairment, nature of symptoms, emotional and behavioral responses during clinical interview, level of literacy, and observed level of motivation/effort, a battery of tests was selected by Dr. Riff during initial consultation on 06/27/2024. This was communicated to the technician. Communication between the neuropsychologist and technician was  ongoing throughout the testing session and changes were made as deemed necessary based on patient performance on testing, technician observations and additional pertinent factors such as those listed above.  Tests Administered: Conners Continuous Performance Test 3rd Edition (CPT-3) Delis-Kaplan Executive Function System (D-KEFS), select subtests Trail Making Test (TMT; Part A & B) Wechsler Adult Intelligence Scale-Fourth Edition (WAIS-IV), select subtests Wechsler Abbreviated Scale of Intelligence CIVIL SERVICE FAST STREAMER) Wechsler Memory Scale-Third Edition (WMS-III), select subtests  [place]  Card Sorting Test [phone]) Autism Spectrum Quotient (AQ) Empathy Quotient-- 40 Item Version (EQ-40)  Executive Skills Questionnaire Geriatric Depression Scale-Short Form (GDS-SF) Geriatric Anxiety Inventory (GAI)  Results: Note: This summary of test scores accompanies the interpretive report and should not be interpreted by unqualified individuals or in isolation without reference to the report. Test scores are relative to age, gender, and educational history as available and appropriate. Measurement properties of test scores: IQ, Index, and Standard Scores (SS): Mean = 100; Standard Deviation = 15; Scaled Scores (ss): Mean = 10; Standard Deviation = 3; Z scores (Z): Mean = 0; Standard Deviation = 1; T scores (T); Mean = 50; Standard Deviation = 10  Intellectual/Premorbid Functioning Estimate   Norm Score Percentile  Range  WASI-II FSIQ-4  SS = 124 95 %ile Above Average   Vocabulary   t = 75 99 %ile Exceptionally High   Matrix Reasoning   t = 61 86 %ile High Average   Block Design  t = 50 50 %ile Average   Similarities  t = 65 94 %ile Above Average   ATTENTION AND WORKING MEMORY    Norm Score Percentile  Range  WAIS-IV          Digit Span  ss = 11 63 %ile Average   DSF  ss = 17 99 %ile Exceptionally High   Span:    9      DSB  ss = 8 25 %ile Average   Span:    3  DSS  ss = 9 37 %ile Average   Span:     4     WMS-III          Spatial Span  ss = 9 37 %ile Average   SSF  ss = 8 25 %ile Average   Span:    5      SSB  ss = 10 50 %ile Average   Span:    4      COMPLEX ATTENTION    Norm Score Percentile  Descriptor  CPT-3          Response Style          Detectability (reverse scored)  t = 60 82 %ile    Omission Errors  t = 52 74 %ile    Commission Errors  t = 57 78 %ile    Perseverations  t = 59 89 %ile    HRT (reaction time)  t = 42 18 %ile    HRT SD (reaction time consistency) t = 61 89 %ile    Variability (in RT consistency)  t = 50 65 %ile    HRT Block Change (over test)  t = 74 98 %ile    HRT ISI Change (by stimuli interval) t = 64 91 %ile    PROCESSING SPEED    Norm Score Percentile  Range  WAIS-IV          Coding  ss = 10 50 %ile Average   Symbol Search  ss = 11 63 %ile Average   EXECUTIVE FUNCTIONING    Norm Score Percentile  Range  DKEFS - Color-Word Interference          Color Naming  ss = 10 50 %ile Average   Word Reading  ss = 12 75 %ile High Average   Inhibition  ss = 13 84 %ile High Average   Errors  ss = 13 84 %ile High Average   Inhibition Switching  ss = 12 75 %ile High Average   Errors  ss = 9 37 %ile Average  Trails A  t = 34 5 %ile Below Average  Trails B  t = 46 32 %ile Average  [place]  Card Sorting Test (WCST)          Total Errors  t = 53 63 %ile Average   Perseverative Errors  t = 54 66 %ile Average   Non-Perseverative Errors  t = 49 45 %ile Average   % Conceptual Responses  t = 59 82 %ile High Average   Categories Completed     >16 %ile WNL   Trials to First Category Complete     >16 %ile WNL   Failure to Maintain Set     >16 %ile WNL   VISUAL-SPATIAL    Norm Score Percentile  Range  WASI-II          Block Design  t = 50 50 %ile Average   Matrix Reasoning  t = 61 86 %ile High Average   PERSONALITY AND BEHAVIORAL FUNCTIONING      Score/Interpretation  GDS-SF Raw       8  GDS-SF Severity       Mild.  GAI Raw       4  GAI Severity        Minimal.  Autism Quotient (AQ)       36  Empathy Quotient (EQ-40)       20  Wellsite Geologist (ESQ)  54    Feedback to Patient: Jacobi Nile will return on 08/15/2024 for an interactive feedback session with Dr. Hayden at which time his test performances, clinical impressions and treatment recommendations will be reviewed in detail. The patient understands he can contact our office should he require our assistance before this time.  150 minutes spent face-to-face with patient administering standardized tests, 90 minutes spent scoring radiographer, therapeutic). [CPT [id number], [id number]]  Full report to follow.

## 2024-07-20 ENCOUNTER — Ambulatory Visit (HOSPITAL_BASED_OUTPATIENT_CLINIC_OR_DEPARTMENT_OTHER): Payer: Self-pay | Admitting: Family

## 2024-07-24 ENCOUNTER — Encounter: Admitting: Psychology

## 2024-07-24 DIAGNOSIS — R41840 Attention and concentration deficit: Secondary | ICD-10-CM | POA: Diagnosis not present

## 2024-07-24 DIAGNOSIS — F900 Attention-deficit hyperactivity disorder, predominantly inattentive type: Secondary | ICD-10-CM

## 2024-07-24 DIAGNOSIS — F840 Autistic disorder: Secondary | ICD-10-CM

## 2024-07-25 ENCOUNTER — Ambulatory Visit: Payer: Self-pay | Admitting: Family Medicine

## 2024-07-25 ENCOUNTER — Encounter: Payer: Self-pay | Admitting: Family Medicine

## 2024-07-25 ENCOUNTER — Ambulatory Visit (INDEPENDENT_AMBULATORY_CARE_PROVIDER_SITE_OTHER): Admitting: Family Medicine

## 2024-07-25 VITALS — BP 112/62 | HR 77 | Resp 18 | Ht 66.0 in | Wt 191.0 lb

## 2024-07-25 DIAGNOSIS — E7849 Other hyperlipidemia: Secondary | ICD-10-CM | POA: Diagnosis not present

## 2024-07-25 DIAGNOSIS — R739 Hyperglycemia, unspecified: Secondary | ICD-10-CM

## 2024-07-25 DIAGNOSIS — I10 Essential (primary) hypertension: Secondary | ICD-10-CM

## 2024-07-25 DIAGNOSIS — E669 Obesity, unspecified: Secondary | ICD-10-CM

## 2024-07-25 DIAGNOSIS — R7989 Other specified abnormal findings of blood chemistry: Secondary | ICD-10-CM

## 2024-07-25 DIAGNOSIS — E039 Hypothyroidism, unspecified: Secondary | ICD-10-CM | POA: Diagnosis not present

## 2024-07-25 LAB — HEPATIC FUNCTION PANEL
ALT: 34 U/L (ref 0–53)
AST: 27 U/L (ref 0–37)
Albumin: 4.5 g/dL (ref 3.5–5.2)
Alkaline Phosphatase: 89 U/L (ref 39–117)
Bilirubin, Direct: 0.1 mg/dL (ref 0.0–0.3)
Total Bilirubin: 0.6 mg/dL (ref 0.2–1.2)
Total Protein: 7 g/dL (ref 6.0–8.3)

## 2024-07-25 LAB — BASIC METABOLIC PANEL WITH GFR
BUN: 19 mg/dL (ref 6–23)
CO2: 27 meq/L (ref 19–32)
Calcium: 9.7 mg/dL (ref 8.4–10.5)
Chloride: 102 meq/L (ref 96–112)
Creatinine, Ser: 1.5 mg/dL (ref 0.40–1.50)
GFR: 45.77 mL/min — ABNORMAL LOW (ref >60.00)
Glucose, Bld: 118 mg/dL — ABNORMAL HIGH (ref 70–99)
Potassium: 4.3 meq/L (ref 3.5–5.1)
Sodium: 138 meq/L (ref 135–145)

## 2024-07-25 LAB — CBC WITH DIFFERENTIAL/PLATELET
Basophils Absolute: 0 K/uL (ref 0.0–0.1)
Basophils Relative: 0.4 % (ref 0.0–3.0)
Eosinophils Absolute: 0.1 K/uL (ref 0.0–0.7)
Eosinophils Relative: 1.5 % (ref 0.0–5.0)
HCT: 49.1 % (ref 39.0–52.0)
Hemoglobin: 17 g/dL (ref 13.0–17.0)
Lymphocytes Relative: 15.5 % (ref 12.0–46.0)
Lymphs Abs: 1 K/uL (ref 0.7–4.0)
MCHC: 34.7 g/dL (ref 30.0–36.0)
MCV: 96 fl (ref 78.0–100.0)
Monocytes Absolute: 0.5 K/uL (ref 0.1–1.0)
Monocytes Relative: 7.8 % (ref 3.0–12.0)
Neutro Abs: 4.7 K/uL (ref 1.4–7.7)
Neutrophils Relative %: 74.8 % (ref 43.0–77.0)
Platelets: 184 K/uL (ref 150.0–400.0)
RBC: 5.11 Mil/uL (ref 4.22–5.81)
RDW: 12.9 % (ref 11.5–15.5)
WBC: 6.3 K/uL (ref 4.0–10.5)

## 2024-07-25 LAB — LIPID PANEL
Cholesterol: 146 mg/dL (ref 0–200)
HDL: 54.1 mg/dL (ref >39.00)
LDL Cholesterol: 71 mg/dL (ref 0–99)
NonHDL: 91.85
Total CHOL/HDL Ratio: 3
Triglycerides: 102 mg/dL (ref 0.0–149.0)
VLDL: 20.4 mg/dL (ref 0.0–40.0)

## 2024-07-25 LAB — TSH: TSH: 4.32 u[IU]/mL (ref 0.35–5.50)

## 2024-07-25 NOTE — Progress Notes (Signed)
   Subjective:    Patient ID: [name] Brown, male    DOB: September 07, 1951, 72 y.o.   MRN: [id number]  HPI HTN- chronic problem, on Dilt 120mg  daily, Losartan  100mg  daily w/ excellent control.  No CP, SOB, HA's, visual changes, edema.  Hyperlipidemia- chronic problem, on Zetia  10mg  daily, Pravastatin  20mg  daily.  No abd pain, N/V  Hypothyroid- chronic problem, on Levothyroxine  100mcg daily.  No changes to skin/hair/nails.  Obesity- pt has gained 4 lbs and BMI now 30.83.  Pt reports he is walking.     Review of Systems For ROS see HPI     Objective:   Physical Exam Vitals reviewed.  Constitutional:      General: He is not in acute distress.    Appearance: Normal appearance. He is well-developed. He is not ill-appearing.  HENT:     Head: Normocephalic and atraumatic.  Eyes:     Extraocular Movements: Extraocular movements intact.     Conjunctiva/sclera: Conjunctivae normal.     Pupils: Pupils are equal, round, and reactive to light.  Neck:     Thyroid : No thyromegaly.  Cardiovascular:     Rate and Rhythm: Normal rate and regular rhythm.     Pulses: Normal pulses.     Heart sounds: Murmur (II/VI SEM at RUSB) heard.  Pulmonary:     Effort: Pulmonary effort is normal. No respiratory distress.     Breath sounds: Normal breath sounds.  Abdominal:     General: Bowel sounds are normal. There is no distension.     Palpations: Abdomen is soft.  Musculoskeletal:     Cervical back: Normal range of motion and neck supple.     Right lower leg: No edema.     Left lower leg: No edema.  Lymphadenopathy:     Cervical: No cervical adenopathy.  Skin:    General: Skin is warm and dry.  Neurological:     General: No focal deficit present.     Mental Status: He is alert and oriented to person, place, and time.     Cranial Nerves: No cranial nerve deficit.  Psychiatric:        Mood and Affect: Mood normal.        Behavior: Behavior normal.           Assessment & Plan:

## 2024-07-25 NOTE — Assessment & Plan Note (Signed)
 Ongoing issue.  He is up 4 lbs and BMI now 30.83  Has recently resumed walking after his femur fx.  Applauded his efforts.  Will follow.

## 2024-07-25 NOTE — Assessment & Plan Note (Signed)
 Currently asymptomatic on Levothyroxine  100mcg daily.  Check labs.  Adjust meds prn

## 2024-07-25 NOTE — Assessment & Plan Note (Signed)
 Chronic problem.  On Dilt and Losartan  w/ excellent control.  Currently asymptomatic.  Check labs due to ARB use but no anticipated med changes.  Will follow.

## 2024-07-25 NOTE — Assessment & Plan Note (Signed)
 Chronic problem.  On Zetia  10mg  daily, Pravastatin  20mg  daily w/o difficulty.  Check labs.  Adjust meds prn

## 2024-07-25 NOTE — Patient Instructions (Addendum)
 Follow up in 6 months to recheck BP and cholesterol We'll notify you of your lab results and make any changes if needed Keep up the good work on healthy diet and regular exercise- you can do it! Call with any questions or concerns Stay Safe!  Stay Healthy! Happy Holidays!!

## 2024-07-26 ENCOUNTER — Ambulatory Visit (INDEPENDENT_AMBULATORY_CARE_PROVIDER_SITE_OTHER)

## 2024-07-26 DIAGNOSIS — R739 Hyperglycemia, unspecified: Secondary | ICD-10-CM

## 2024-07-26 NOTE — Addendum Note (Signed)
 Addended by: Kaelah Hayashi D on: 07/26/2024 11:11 AM   Modules accepted: Orders

## 2024-07-28 ENCOUNTER — Encounter (HOSPITAL_COMMUNITY): Payer: Self-pay

## 2024-07-28 ENCOUNTER — Encounter: Payer: Self-pay | Admitting: Family Medicine

## 2024-07-28 ENCOUNTER — Other Ambulatory Visit (HOSPITAL_COMMUNITY): Payer: Self-pay | Admitting: Psychiatry

## 2024-07-28 DIAGNOSIS — F419 Anxiety disorder, unspecified: Secondary | ICD-10-CM

## 2024-07-28 DIAGNOSIS — F900 Attention-deficit hyperactivity disorder, predominantly inattentive type: Secondary | ICD-10-CM

## 2024-07-29 ENCOUNTER — Ambulatory Visit: Payer: Self-pay | Admitting: Family Medicine

## 2024-07-29 ENCOUNTER — Other Ambulatory Visit (HOSPITAL_COMMUNITY): Payer: Self-pay

## 2024-07-29 DIAGNOSIS — F419 Anxiety disorder, unspecified: Secondary | ICD-10-CM

## 2024-07-29 DIAGNOSIS — F900 Attention-deficit hyperactivity disorder, predominantly inattentive type: Secondary | ICD-10-CM

## 2024-07-29 LAB — HEMOGLOBIN A1C: Hgb A1c MFr Bld: 5.6 % (ref 4.6–6.5)

## 2024-07-29 MED ORDER — BUPROPION HCL ER (XL) 300 MG PO TB24: 300.0000 mg | ORAL_TABLET | Freq: Every day | ORAL | 0 refills | Status: DC

## 2024-07-30 ENCOUNTER — Ambulatory Visit (HOSPITAL_BASED_OUTPATIENT_CLINIC_OR_DEPARTMENT_OTHER): Admitting: Cardiovascular Disease

## 2024-08-06 ENCOUNTER — Ambulatory Visit (HOSPITAL_BASED_OUTPATIENT_CLINIC_OR_DEPARTMENT_OTHER): Admitting: Family

## 2024-08-13 ENCOUNTER — Other Ambulatory Visit

## 2024-08-13 ENCOUNTER — Telehealth (HOSPITAL_COMMUNITY): Admitting: Psychiatry

## 2024-08-13 ENCOUNTER — Encounter (HOSPITAL_COMMUNITY): Payer: Self-pay | Admitting: Psychiatry

## 2024-08-13 VITALS — Wt 191.0 lb

## 2024-08-13 DIAGNOSIS — F900 Attention-deficit hyperactivity disorder, predominantly inattentive type: Secondary | ICD-10-CM | POA: Diagnosis not present

## 2024-08-13 DIAGNOSIS — R7989 Other specified abnormal findings of blood chemistry: Secondary | ICD-10-CM

## 2024-08-13 DIAGNOSIS — F419 Anxiety disorder, unspecified: Secondary | ICD-10-CM

## 2024-08-13 MED ORDER — BUPROPION HCL ER (XL) 300 MG PO TB24: 300.0000 mg | ORAL_TABLET | Freq: Every day | ORAL | 0 refills | Status: DC

## 2024-08-13 NOTE — Progress Notes (Signed)
 [facility] MD Virtual Progress Note   Patient Location: Home Provider Location: Home Office  I connect with patient by video and verified that I am speaking with correct person by using two identifiers. I discussed the limitations of evaluation and management by telemedicine and the availability of in person appointments. I also discussed with the patient that there may be a patient responsible charge related to this service. The patient expressed understanding and agreed to proceed.  [name] Brown [id number] 73 y.o.  08/13/2024 10:06 AM  History of Present Illness:  Patient is evaluated by video session.  He reported his anxiety and depression is better but still struggle with attention and focus and sometimes multitasking.  He had a visit with psychologist and had appointment next week again to discuss and go over with the results.  Initial results shows attention and concentration deficit but need more formal testing.  He wants to know if he has autism spectrum disorder.  He reported Wellbutrin  helping most of his anxiety.  He sleeps good.  He has mild tremors but does not interfere in his daily activities.  He enjoy working on the guitar and he is working on one of the guitar which he like to give give to his great grandchild.  Patient told recently they visited [facility] to see brother-in-law I had a good time.  He is also looking forward for Christmas.  Patient lives with his wife.  Patient has no children.  He is very close to his sister and her husband.  Patient denies any crying spells, feeling of hopelessness or worthlessness.  He denies any suicidal thoughts.  His appetite is okay and his weight is stable.  Recently had a blood work and all labs are normal.  He denies drinking or using any illegal substances.  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  He admitted sometimes difficulty learning new music and hoping to discuss with his psychologist on his  upcoming appointment in few days.  Past Psychiatric History: No history of inpatient psychosis, mania, suicidal attempt.  History of anxiety, difficulty in attention and focus but never tested for ADD.  Seen by Dr. Corina in inpatient rehab and suggested antidepressant.  History of heavy drinking but no withdrawals, seizures or blackout.   Past Medical History:  Diagnosis Date   Aortic stenosis    Ascending aortic aneurysm 09/18/2018   Colon cancer [facility])    Family history of adverse reaction to anesthesia    pt states his mother had 2 episodes of hyponatremia following anesthesia    GERD (gastroesophageal reflux disease)    Headache    Heart murmur    Hyperlipidemia 10/25/2016   Hypertension    pt is currently not taking any medications; pt states is borderline   Melanoma (HCC)    OSA on CPAP 09/18/2018   PAF (paroxysmal atrial fibrillation) (HCC) 06/13/2017   Perforated sigmoid colon (HCC)    PVC's (premature ventricular contractions) 10/25/2016   Tinnitus    Wears glasses     Outpatient Encounter Medications as of 08/13/2024  Medication Sig   aspirin  81 MG chewable tablet Chew 81 mg by mouth daily.   buPROPion  (WELLBUTRIN  XL) 300 MG 24 hr tablet Take 1 tablet (300 mg total) by mouth daily.   diltiazem  (CARDIZEM  CD) 120 MG 24 hr capsule TAKE 1 CAPSULE BY MOUTH DAILY   ezetimibe  (ZETIA ) 10 MG tablet TAKE 1 TABLET BY MOUTH DAILY   famotidine  (PEPCID ) 20 MG tablet  Take 20 mg by mouth daily as needed for heartburn or indigestion.   levothyroxine  (SYNTHROID ) 100 MCG tablet Take 1 tablet (100 mcg total) by mouth daily.   losartan  (COZAAR ) 100 MG tablet Take 1 tablet (100 mg total) by mouth daily.   Multiple Vitamin (MULTIVITAMIN WITH MINERALS) TABS tablet Take 1 tablet by mouth daily.   pravastatin  (PRAVACHOL ) 20 MG tablet TAKE 1 TABLET BY MOUTH AT BEDTIME   rOPINIRole  (REQUIP ) 0.25 MG tablet Take 3 tablets (0.75 mg total) by mouth at bedtime.   sildenafil  (VIAGRA ) 100 MG tablet  Take 1 tablet (100 mg total) by mouth daily as needed for erectile dysfunction.   XIIDRA 5 % SOLN Place 1 Dose into both eyes 2 (two) times daily.   No facility-administered encounter medications on file as of 08/13/2024.    Recent Results (from the past 2160 hours)  I-STAT creatinine     Status: None   Collection Time: 07/15/24 11:00 AM  Result Value Ref Range   Creatinine, Ser 1.20 0.61 - 1.24 mg/dL  Lipid panel     Status: None   Collection Time: 07/25/24  9:43 AM  Result Value Ref Range   Cholesterol 146 0 - 200 mg/dL    Comment: ATP III Classification       Desirable:  < 200 mg/dL               Borderline High:  200 - 239 mg/dL          High:  > = 759 mg/dL   Triglycerides 897.9 0.0 - 149.0 mg/dL    Comment: Normal:  <849 mg/dLBorderline High:  150 - 199 mg/dL   HDL 45.89 >60.99 mg/dL   VLDL 79.5 0.0 - 59.9 mg/dL   LDL Cholesterol 71 0 - 99 mg/dL   Total CHOL/HDL Ratio 3     Comment:                Men          Women1/2 Average Risk     3.4          3.3Average Risk          5.0          4.42X Average Risk          9.6          7.13X Average Risk          15.0          11.0                       NonHDL 91.85     Comment: NOTE:  Non-HDL goal should be 30 mg/dL higher than patient's LDL goal (i.e. LDL goal of < 70 mg/dL, would have non-HDL goal of < 100 mg/dL)  Basic metabolic panel with GFR     Status: Abnormal   Collection Time: 07/25/24  9:43 AM  Result Value Ref Range   Sodium 138 135 - 145 mEq/L   Potassium 4.3 3.5 - 5.1 mEq/L   Chloride 102 96 - 112 mEq/L   CO2 27 19 - 32 mEq/L    Comment: Elevated LDH levels may cause falsely increased CO2 results. If LDH is >2000 U/L, a positive bias of 12% is possible.   Glucose, Bld 118 (H) 70 - 99 mg/dL   BUN 19 6 - 23 mg/dL   Creatinine, Ser 8.49 0.40 - 1.50 mg/dL   GFR 54.22 (L) >39.99 mL/min  Comment: Calculated using the CKD-EPI Creatinine Equation (2021)   Calcium  9.7 8.4 - 10.5 mg/dL  TSH     Status: None   Collection Time:  07/25/24  9:43 AM  Result Value Ref Range   TSH 4.32 0.35 - 5.50 uIU/mL  Hepatic function panel     Status: None   Collection Time: 07/25/24  9:43 AM  Result Value Ref Range   Total Bilirubin 0.6 0.2 - 1.2 mg/dL   Bilirubin, Direct 0.1 0.0 - 0.3 mg/dL   Alkaline Phosphatase 89 39 - 117 U/L   AST 27 0 - 37 U/L   ALT 34 0 - 53 U/L   Total Protein 7.0 6.0 - 8.3 g/dL   Albumin  4.5 3.5 - 5.2 g/dL  CBC with Differential/Platelet     Status: None   Collection Time: 07/25/24  9:43 AM  Result Value Ref Range   WBC 6.3 4.0 - 10.5 K/uL   RBC 5.11 4.22 - 5.81 Mil/uL   Hemoglobin 17.0 13.0 - 17.0 g/dL   HCT 50.8 60.9 - 47.9 %   MCV 96.0 78.0 - 100.0 fl   MCHC 34.7 30.0 - 36.0 g/dL   RDW 87.0 88.4 - 84.4 %   Platelets 184.0 150.0 - 400.0 K/uL   Neutrophils Relative % 74.8 43.0 - 77.0 %   Lymphocytes Relative 15.5 12.0 - 46.0 %   Monocytes Relative 7.8 3.0 - 12.0 %   Eosinophils Relative 1.5 0.0 - 5.0 %   Basophils Relative 0.4 0.0 - 3.0 %   Neutro Abs 4.7 1.4 - 7.7 K/uL   Lymphs Abs 1.0 0.7 - 4.0 K/uL   Monocytes Absolute 0.5 0.1 - 1.0 K/uL   Eosinophils Absolute 0.1 0.0 - 0.7 K/uL   Basophils Absolute 0.0 0.0 - 0.1 K/uL  HgB A1c     Status: None   Collection Time: 07/26/24 10:48 AM  Result Value Ref Range   Hgb A1c MFr Bld 5.6 4.6 - 6.5 %    Comment: Glycemic Control Guidelines for People with Diabetes:Non Diabetic:  <6%Goal of Therapy: <7%Additional Action Suggested:  >8%      Psychiatric Specialty Exam: Physical Exam  Review of Systems  Weight 191 lb (86.6 kg).There is no height or weight on file to calculate BMI.  General Appearance: Casual  Eye Contact:  Fair  Speech:  Clear and Coherent  Volume:  Normal  Mood:  Euthymic  Affect:  Congruent  Thought Process:  Goal Directed  Orientation:  Full (Time, Place, and Person)  Thought Content:  Logical  Suicidal Thoughts:  No  Homicidal Thoughts:  No  Memory:  Immediate;   Good Recent;   Good Remote;   Good  Judgement:   Intact  Insight:  Present  Psychomotor Activity:  Normal  Concentration:  Concentration: Fair and Attention Span: Fair  Recall:  Good  Fund of Knowledge:  Good  Language:  Good  Akathisia:  No  Handed:  Right  AIMS (if indicated):     Assets:  Communication Skills Desire for Improvement Housing Social Support Talents/Skills Transportation  ADL's:  Intact  Cognition:  WNL  Sleep:  ok but occasionally dreams.       07/25/2024    9:21 AM 01/18/2024    1:06 PM 01/11/2024   10:53 AM 12/12/2023   10:20 AM 11/30/2023   10:39 AM  Depression screen PHQ 2/9  Decreased Interest 0 1 1 1 1   Down, Depressed, Hopeless 1 1 1 1 1   PHQ - 2  Score 1 2 2 2 2   Altered sleeping 0 2 0 0 1  Tired, decreased energy 2 2 1  0 0  Change in appetite 1 0 0 0 1  Feeling bad or failure about yourself  1 1 1  0 0  Trouble concentrating 1 3 0 1 0  Moving slowly or fidgety/restless 0 0 0 0 0  Suicidal thoughts 0 0 0 0 0  PHQ-9 Score 6 10  4  3  4    Difficult doing work/chores   Somewhat difficult Not difficult at all Somewhat difficult     Data saved with a previous flowsheet row definition    Assessment/Plan: ADHD (attention deficit hyperactivity disorder), inattentive type - Plan: buPROPion  (WELLBUTRIN  XL) 300 MG 24 hr tablet  Anxiety - Plan: buPROPion  (WELLBUTRIN  XL) 300 MG 24 hr tablet  Patient is 73 year old man with history of AV replacement, colon cancer, right colectomy, coronary artery disease, hypothyroidism, hypertension, sleep apnea, possible ADHD and anxiety.  Doing better on Wellbutrin  XL 300 mg a day.  He had visit with psychologist for testing and now he has appointment next week to discuss with results.  We discussed to keep the Wellbutrin  dose same for now and once we have final results and conclusion we will revisit the medication.  Like to know if he has autism spectrum disorder and I have discussed that there is no medication to treat that but there is a possibility of treatment of ADHD.  I  also explained Wellbutrin  is also used to help the adult ADHD.  Will follow-up in 3 months.  We will review the final psychological testing results and notes from psychologist.  Encouraged to call back if is any question or any concern.  Follow Up Instructions:     I discussed the assessment and treatment plan with the patient. The patient was provided an opportunity to ask questions and all were answered. The patient agreed with the plan and demonstrated an understanding of the instructions.   The patient was advised to call back or seek an in-person evaluation if the symptoms worsen or if the condition fails to improve as anticipated.    Collaboration of Care: Other provider involved in patient's care AEB notes are available in epic to review  Patient/Guardian was advised Release of Information must be obtained prior to any record release in order to collaborate their care with an outside provider. Patient/Guardian was advised if they have not already done so to contact the registration department to sign all necessary forms in order for us  to release information regarding their care.   Consent: Patient/Guardian gives verbal consent for treatment and assignment of benefits for services provided during this visit. Patient/Guardian expressed understanding and agreed to proceed.     Total encounter time 17 minutes which includes face-to-face time, chart reviewed, care coordination, order entry and documentation during this encounter.   Note: This document was prepared by [employer] voice dictation technology and any errors that results from this process are unintentional.    Leni ONEIDA Client, MD 08/13/2024

## 2024-08-14 ENCOUNTER — Other Ambulatory Visit: Payer: Self-pay | Admitting: Cardiovascular Disease

## 2024-08-14 LAB — BASIC METABOLIC PANEL WITH GFR
BUN: 15 mg/dL (ref 6–23)
CO2: 27 meq/L (ref 19–32)
Calcium: 9.5 mg/dL (ref 8.4–10.5)
Chloride: 102 meq/L (ref 96–112)
Creatinine, Ser: 1.22 mg/dL (ref 0.40–1.50)
GFR: 58.62 mL/min — ABNORMAL LOW (ref >60.00)
Glucose, Bld: 81 mg/dL (ref 70–99)
Potassium: 4.4 meq/L (ref 3.5–5.1)
Sodium: 138 meq/L (ref 135–145)

## 2024-08-15 ENCOUNTER — Encounter: Attending: Psychology | Admitting: Psychology

## 2024-08-15 DIAGNOSIS — F900 Attention-deficit hyperactivity disorder, predominantly inattentive type: Secondary | ICD-10-CM | POA: Diagnosis not present

## 2024-08-15 DIAGNOSIS — F840 Autistic disorder: Secondary | ICD-10-CM

## 2024-08-20 ENCOUNTER — Encounter (HOSPITAL_BASED_OUTPATIENT_CLINIC_OR_DEPARTMENT_OTHER): Payer: Self-pay | Admitting: Family

## 2024-08-20 ENCOUNTER — Ambulatory Visit (INDEPENDENT_AMBULATORY_CARE_PROVIDER_SITE_OTHER): Admitting: Family

## 2024-08-20 VITALS — BP 122/72 | HR 67 | Ht 66.0 in | Wt 196.0 lb

## 2024-08-20 DIAGNOSIS — I7121 Aneurysm of the ascending aorta, without rupture: Secondary | ICD-10-CM | POA: Diagnosis not present

## 2024-08-20 DIAGNOSIS — Z952 Presence of prosthetic heart valve: Secondary | ICD-10-CM | POA: Diagnosis not present

## 2024-08-20 DIAGNOSIS — I10 Essential (primary) hypertension: Secondary | ICD-10-CM

## 2024-08-20 DIAGNOSIS — I2510 Atherosclerotic heart disease of native coronary artery without angina pectoris: Secondary | ICD-10-CM | POA: Diagnosis not present

## 2024-08-20 DIAGNOSIS — E782 Mixed hyperlipidemia: Secondary | ICD-10-CM | POA: Diagnosis not present

## 2024-08-20 MED ORDER — PRAVASTATIN SODIUM 20 MG PO TABS: 20.0000 mg | ORAL_TABLET | Freq: Every day | ORAL | 3 refills | Status: AC

## 2024-08-20 MED ORDER — METOPROLOL SUCCINATE ER 25 MG PO TB24: 25.0000 mg | ORAL_TABLET | Freq: Every day | ORAL | 1 refills | Status: AC

## 2024-08-20 MED ORDER — EZETIMIBE 10 MG PO TABS: 10.0000 mg | ORAL_TABLET | Freq: Every day | ORAL | 3 refills | Status: AC

## 2024-08-20 NOTE — Patient Instructions (Addendum)
 Medication Instructions:  STOP Diltiazem   START Metoprolol  Succinate 25mg  every evening  *If you need a refill on your cardiac medications before your next appointment, please call your pharmacy*   Testing/Procedures: Your EKG today looked great!  Your provider recommends CT Aorta in November 2026--we will see if checkout can go ahead and get this scheduled for you  Follow-Up: At [facility], you and your health needs are our priority.  As part of our continuing mission to provide you with exceptional heart care, our providers are all part of one team.  This team includes your primary Cardiologist (physician) and Advanced Practice Providers or APPs (Physician Assistants and Nurse Practitioners) who all work together to provide you with the care you need, when you need it.  Your next appointment:   1 year(s)  Provider:   Annabella Scarce, MD, Rosaline Bane, NP, or Reche Finder, NP      Other Instructions  We will send you a MyChart message in 2 weeks to check in on your blood pressure.   Heart Healthy Diet Recommendations: A low-salt diet is recommended. Meats should be grilled, baked, or boiled. Avoid fried foods. Focus on lean protein sources like fish or chicken with vegetables and fruits. The American Heart Association is a Chief Technology Officer!  American Heart Association Diet and Lifeystyle Recommendations   Exercise recommendations: The American Heart Association recommends 150 minutes of moderate intensity exercise weekly. Try 30 minutes of moderate intensity exercise 4-5 times per week. This could include walking, jogging, or swimming.

## 2024-08-20 NOTE — Progress Notes (Signed)
 Cardiology Office Note   Date:  08/20/2024  ID:  [name] Brown, DOB 14-Nov-1950, MRN [id number] PCP: Mahlon Comer BRAVO, MD  [facility] Providers Cardiologist:  Annabella Scarce, MD     History of Present Illness [name] Brown is a 73 y.o. male with a history of nonobstructive CAD, chronic systolic and diastolic heart failure (2018 LVEF 30-35% ? 03/2019 LVEF 60-65% ? 07/2023 LVEF 50-55%), aortic stenosis s/p bioprosthetic aortic valve, colon cancer s/p right colectomy.  LHC 10/2016 with mild to moderate CAD and LVEF 45-50%.  Repeat echo 01/2017 LVEF 30 to 35%, grade 1 diastolic dysfunction.  Ultimately 06/08/2017 underwent AVR with 25 mm [facility] Ease pericardial valve.  Hospitalization complicated by atrial fibrillation and started on amiodarone  which was discontinued 3 months postop. Repeat echo 03/2019 LVEF 60 to 65%.  Most recent 07/2023 LVEF 50-55%, no RWMA, mild LVH, RV normal, trivial MR, ascendign aorta dilated 46mm, aortic valve bioprosthetic valve functioning appropriately by Dr. Lonni review of echo images.   10/2023 right femur fracture and required CIR. CT Aorta 07/15/24 ascending aorta 40mm, stable from previous.   Presents today follow up independently. 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 Feeling well since last seen. Reports no shortness of breath nor dyspnea on exertion. Reports no chest pain, pressure, or tightness. No edema, orthopnea, PND. Reports no palpitations.  Working with PT and feels back to 98% of his mobility. Reviewed imaging of aorta, reassuring provided. Inquires about changing from diltiazem  to metoprolol  due to mild essential tremor. Metoprolol  previously discontinued due to exercise intolerance but reports did not see significant improvement after making that change. He is exercising regularly with a personal trainer without issue.   ROS: Please see the history of present illness.    All other systems reviewed and are negative.    Studies Reviewed EKG Interpretation Date/Time:  Tuesday August 20 2024 11:25:22 EST Ventricular Rate:  67 PR Interval:  184 QRS Duration:  88 QT Interval:  398 QTC Calculation: 420 R Axis:   33  Text Interpretation: Normal sinus rhythm No acute St/T wave changes nonspecific IVCD Confirmed by [name] ([id number]) on 08/20/2024 11:31:46 AM    Cardiac Studies & Procedures   ______________________________________________________________________________________________ CARDIAC CATHETERIZATION  CARDIAC CATHETERIZATION 11/03/2016  Conclusion  Mid RCA to Dist RCA lesion, 10 %stenosed.  RPDA lesion, 10 %stenosed.  Ost Cx to Prox Cx lesion, 40 %stenosed.  Ost Ramus to Ramus lesion, 20 %stenosed.  Mid LAD lesion, 20 %stenosed.  Prox LAD lesion, 30 %stenosed.  The left ventricular ejection fraction is 45-50% by visual estimate.  There is mild left ventricular systolic dysfunction.  LV end diastolic pressure is normal.  There is no mitral valve regurgitation.  1. Mild to moderate non-obstructive CAD 2. Mild LV systolic dysfunction by LV gram, estimated around 45-50%. 3. Normal filling pressures 4. Non-ischemic cardiomyopathy  Recommendations: Medical management of CAD and non-ischemic cardiomyopathy  Findings Coronary Findings Diagnostic  Dominance: Right  Left Anterior Descending  First Diagonal Branch Vessel is small in size.  First Septal Branch Vessel is small in size.  Second Diagonal Branch Vessel is small in size.  Second Septal Branch Vessel is small in size.  Third Diagonal Branch Vessel is small in size.  Ramus Intermedius Vessel is moderate in size.  Left Circumflex Vessel is large. The lesion is calcified.  First Obtuse Marginal Branch Vessel is small in size.  Second Obtuse Marginal Branch Vessel is moderate in size.  Third Obtuse Marginal Branch Vessel is  moderate in size.  Right Coronary Artery  Right Posterior Descending  Artery  Intervention  No interventions have been documented.   STRESS TESTS  MYOCARDIAL PERFUSION IMAGING 10/20/2022  Interpretation Summary   Findings are consistent with no ischemia. The study is low risk.   No ST deviation was noted.   LV perfusion is normal.   Left ventricular function is abnormal. Global function is mildly reduced. Nuclear stress EF: 51 %. The left ventricular ejection fraction is mildly decreased (45-54%). End diastolic cavity size is normal. End systolic cavity size is normal.   Prior study not available for comparison.  Normal perfusion. A wide-complex rhythm was noted during exercise which is condordant and appears to be exercise-induced LBBB that was transient. Overall low risk stress test. LVEF 51% with incoordinate septal motion. No prior for comparison.   ECHOCARDIOGRAM  ECHOCARDIOGRAM COMPLETE 07/17/2023  Narrative ECHOCARDIOGRAM REPORT    Patient Name:   [name] Brown Date of Exam: 07/17/2023 Medical Rec #:  [id number]    Height:       66.0 in Accession #:    [id number]   Weight:       184.0 lb Date of Birth:  05-04-1951   BSA:          1.930 m Patient Age:    71 years     BP:           139/82 mmHg Patient Gender: M            HR:           67 bpm. Exam Location:  Outpatient  Procedure: 2D Echo, 3D Echo, Strain Analysis, Color Doppler and Cardiac Doppler  Indications:    S/P aortic valve replacement  History:        Patient has prior history of Echocardiogram examinations, most recent 08/10/2021. Arrythmias:PVC and LBBB; Risk Factors:Hypertension, Non-Smoker and Dyslipidemia. Bicuspid valve s/p AVR 2018. Aortic Valve: 25 mm bioprosthetic valve is present in the aortic position.  Sonographer:    Orvil Holmes RDCS Referring Phys: [id number] [facility]  IMPRESSIONS   1. Left ventricular ejection fraction, by estimation, is 50 to 55%. The left ventricle has low normal function. The left ventricle has no regional wall motion  abnormalities. There is mild left ventricular hypertrophy. Left ventricular diastolic parameters are indeterminate. 2. Right ventricular systolic function is normal. The right ventricular size is normal. Tricuspid regurgitation signal is inadequate for assessing PA pressure. 3. The mitral valve is normal in structure. Trivial mitral valve regurgitation. No evidence of mitral stenosis. 4. Aortic dilatation noted. Aneurysm of the ascending aorta, measuring 46 mm. 5. The aortic valve has been repaired/replaced. Aortic valve regurgitation is not visualized. There is a 25 mm bioprosthetic valve present in the aortic position. Aortic valve mean gradient measures 13.0 mmHg, stable from prior echoes. Low DI (0.19) and EOA (0.6cm^2), suspect inaccurate LVOT VTI measurement, recommend repeating  FINDINGS Left Ventricle: Left ventricular ejection fraction, by estimation, is 50 to 55%. The left ventricle has low normal function. The left ventricle has no regional wall motion abnormalities. The left ventricular internal cavity size was normal in size. There is mild left ventricular hypertrophy. Left ventricular diastolic parameters are indeterminate.  Right Ventricle: The right ventricular size is normal. No increase in right ventricular wall thickness. Right ventricular systolic function is normal. Tricuspid regurgitation signal is inadequate for assessing PA pressure.  Left Atrium: Left atrial size was normal in size.  Right Atrium: Right atrial size was normal in size.  Pericardium: There is no evidence of pericardial effusion.  Mitral Valve: The mitral valve is normal in structure. Trivial mitral valve regurgitation. No evidence of mitral valve stenosis.  Tricuspid Valve: The tricuspid valve is normal in structure. Tricuspid valve regurgitation is trivial.  Aortic Valve: The aortic valve has been repaired/replaced. Aortic valve regurgitation is not visualized. Aortic valve mean gradient measures 13.0  mmHg. Aortic valve peak gradient measures 24.0 mmHg. Aortic valve area, by VTI measures 0.59 cm. There is a 25 mm bioprosthetic valve present in the aortic position.  Pulmonic Valve: The pulmonic valve was not well visualized. Pulmonic valve regurgitation is trivial.  Aorta: Aortic dilatation noted and the aortic root is normal in size and structure. There is an aneurysm involving the ascending aorta measuring 46 mm.  IAS/Shunts: No atrial level shunt detected by color flow Doppler.   LEFT VENTRICLE PLAX 2D LVIDd:         4.72 cm   Diastology LVIDs:         3.51 cm   LV e' medial:    4.24 cm/s LV PW:         1.29 cm   LV E/e' medial:  18.9 LV IVS:        1.47 cm   LV e' lateral:   10.70 cm/s LVOT diam:     2.00 cm   LV E/e' lateral: 7.5 LV SV:         34 LV SV Index:   17 LVOT Area:     3.14 cm  3D Volume EF: 3D EF:        49 % LV EDV:       105 ml LV ESV:       54 ml LV SV:        51 ml  RIGHT VENTRICLE RV Basal diam:  4.05 cm RV Mid diam:    2.72 cm RV S prime:     8.81 cm/s TAPSE (M-mode): 2.2 cm  LEFT ATRIUM           Index        RIGHT ATRIUM           Index LA diam:      3.60 cm 1.87 cm/m   RA Area:     16.50 cm LA Vol (A2C): 41.3 ml 21.40 ml/m  RA Volume:   48.20 ml  24.97 ml/m LA Vol (A4C): 55.8 ml 28.91 ml/m AORTIC VALVE AV Area (Vmax):    0.62 cm AV Area (Vmean):   0.58 cm AV Area (VTI):     0.59 cm AV Vmax:           245.00 cm/s AV Vmean:          168.000 cm/s AV VTI:            0.566 m AV Peak Grad:      24.0 mmHg AV Mean Grad:      13.0 mmHg LVOT Vmax:         48.50 cm/s LVOT Vmean:        31.200 cm/s LVOT VTI:          0.107 m LVOT/AV VTI ratio: 0.19  AORTA Ao Root diam: 3.10 cm Ao Asc diam:  4.60 cm  MITRAL VALVE MV Area (PHT): 3.48 cm    SHUNTS MV Decel Time: 218 msec    Systemic VTI:  0.11 m MR Peak grad: 63.4 mmHg    Systemic Diam: 2.00 cm MR Vmax:  398.00 cm/s MV E velocity: 80.20 cm/s MV A velocity: 71.30 cm/s MV E/A  ratio:  1.12  Lonni Nanas MD Electronically signed by Lonni Nanas MD Signature Date/Time: 07/17/2023/1:44:45 PM    Final   TEE  ECHO TEE 06/08/2017  Interpretation Summary  Mitral valve: No leaflet thickening and calcification present.  Left ventricle: Normal cavity size, wall thickness and left ventricular diastolic function. LV systolic function is mildly to moderately reduced with an EF of 40-45%. There are no obvious wall motion abnormalities. No thrombus present. No mass present.  Right ventricle: Normal cavity size, wall thickness and ejection fraction. No thrombus present. No mass present.  Aortic valve: Moderate valve calcification present. Moderate stenosis. Mild regurgitation.      CARDIAC MRI  MR CARDIAC MORPHOLOGY W WO CONTRAST 02/20/2017  Narrative CLINICAL DATA:  Cardiomyopathy Aortic stenosis  EXAM: CARDIAC MRI  TECHNIQUE: The patient was scanned on a 1.5 Tesla GE magnet. A dedicated cardiac coil was used. Functional imaging was done using Fiesta sequences. 2,3, and 4 chamber views were done to assess for RWMA's. Modified Simpson's rule using a short axis stack was used to calculate an ejection fraction on a dedicated work [employer]. The patient received 23 cc of Multihance . After 10 minutes inversion recovery sequences were used to assess for infiltration and scar tissue.  CONTRAST:  23 cc Multihance   FINDINGS: The atria were normal size. There was no ASD/VSD/PFO. There was no pericardial effusion. The RV was normal in size and function The LV was moderately dilated with no definitive RWMA and paradoxic septal motion. The mitral and tricuspid valves were normal. The aortic valve appeared bicuspid with fused right and non coronary cusps. The mean gradient across the valve by flow velocity was only 12 mmHg  But likely underestimated. There was no AR. The quantitative EF was 41% (EDV 128 cc ESV 75 cc SV 53 cc)  Delayed enhancement images showed mild mid myocardial uptake in the basal and mid septum seen usually with non ischemic DCM  Aortic Root:  3.7 cm  Descending Thoracic Aorta:  2.2 cm  Sinus of Valsalva 3.8 cm  Septal thickness 15 mm  IMPRESSION: 1) Bicuspid aortic valve with likely at least moderate stenosis given degree of LV dysfunction no AR  2) Moderate LVE with paradoxic septal motion EF 41%  3) No evidence of scar / infarct mild mid myocardial uptake in the basal and mid septum seen mostly in non ischemic DCM  4) Normal aortic root 3.7 cm  Maude Emmer   Electronically Signed By: Maude Emmer M.D. On: 02/20/2017 15:31   ______________________________________________________________________________________________      Risk Assessment/Calculations           Physical Exam VS:  BP 122/72 (BP Location: Left Arm, Patient Position: Sitting, Cuff Size: Normal)   Pulse 67   Ht 5' 6 (1.676 m)   Wt 196 lb (88.9 kg)   SpO2 99%   BMI 31.64 kg/m        Wt Readings from Last 3 Encounters:  08/20/24 196 lb (88.9 kg)  07/25/24 191 lb (86.6 kg)  06/05/24 186 lb 8 oz (84.6 kg)    GEN: Well nourished, well developed in no acute distress NECK: No JVD; No carotid bruits CARDIAC: RRR, no murmurs, rubs, gallops RESPIRATORY:  Clear to auscultation without rales, wheezing or rhonchi  ABDOMEN: Soft, non-tender, non-distended EXTREMITIES:  No edema; No deformity   ASSESSMENT AND PLAN  Aortic dilation - CT 07/2024 4.0  cm. Continue optimal BP control, avoid fluoroquinolones. Repeat CT Aorta 07/2025 for monitoring.   HTN -BP at goal less than 130/80.  Request recent addition from diltiazem  to metoprolol  for mild essential tremor benefit.  Stop diltiazem , start Toprol  25 mg daily.  Continue losartan  100 mg daily.  MyChart message in 2 weeks to check in.  If BP not routinely at goal less than 130/80 consider transition from losartan  to valsartan  or olmesartan.  Nonobstructive  CAD/HLD, LDL goal less than 70- Stable with no anginal symptoms. No indication for ischemic evaluation.  GDMT aspirin  81 mg daily, Zetia  10 mg daily, metoprolol  succinate 25 mg daily, pravastatin  20 mg daily.  Refills provided.Recommend aiming for 150 minutes of moderate intensity activity per week and following a heart healthy diet.     S/p bioprosthetic AVR - appropriate function by echo 07/2023. Consider repeat echo in 2-3 years  HLD Depression CAD S/p bioprosthetic AVR        Dispo: follow up in 1 year  Signed, Reche GORMAN Finder, NP

## 2024-08-22 ENCOUNTER — Ambulatory Visit: Admitting: Family Medicine

## 2024-08-28 ENCOUNTER — Encounter (HOSPITAL_BASED_OUTPATIENT_CLINIC_OR_DEPARTMENT_OTHER): Payer: Self-pay

## 2024-09-15 NOTE — Progress Notes (Signed)
" ° °  NEUROPSYCHOLOGICAL EVALUATION [place]. [facility]  Physical Medicine and Rehabilitation     Patient: [name] Brown  MRN: [id number] DOB: 07-02-1951   Service Provider/Clinical Neuropsychologist: Evalene DOROTHA Riff, PsyD  Date of Service: 08/15/2024 Start Time: 1 PM End Time: 2 PM  Location of Service:  [facility] Physical Medicine & Rehabilitation Department [place]. [facility] [address] [place], [place] [address] Phone: [phone]   Billing Code/Service: [phone]    Individuals present: Patient, Provider Laurier DOROTHA Riff, PsyD)  Provider conducted the 60-minute interactive feedback appointment in-person with the patient.  The provider reviewed and discussed the results of neuropsychological evaluation. Follow-up interviewing was conducted as needed to refine interpretation of findings as needed. Review of results included overall findings, diagnosis, and treatment planning/recommendations that were derived from integration of patient data, interpretation of standardized rest results and clinical data, and clinical decision making, which are documented in the patient's electronic medical record with the full report (date listed below). A copy of the full report will also be mailed to the patient.   The patient expressed understanding of the information reviewed. The patient was provided opportunity to ask questions which were then answered by the provider. The provider worked collaboratively to tailor treatment recommendations to the patient when possible. The patient was informed they could reach out to the provider should additional questions related to the evaluation arise.    The final neuropsychological evaluation report will be documented in the patient's chart with DATE 07/24/24, and amended as needed to reflect any additional information obtained during the feedback appointment including treatment planning collaboration.    Evalene DOROTHA Riff, PsyD [facility] [address] [place], [place] [address] Main: [phone] Fax: [phone] [place] License # [id number]  This report was generated using voice recognition software. While this document has been carefully reviewed, transcription errors may be present. I apologize in advance for any inconvenience. Please contact me if further clarification is needed.  "

## 2024-09-30 ENCOUNTER — Telehealth: Payer: Self-pay

## 2024-09-30 NOTE — Telephone Encounter (Signed)
 Patient states he is still waiting on report. He states he has appt with Psychiatrist on 1/26

## 2024-10-05 NOTE — Progress Notes (Signed)
 "  NEUROPSYCHOLOGICAL EVALUATION [place]. [facility]  Physical Medicine and Rehabilitation  Patient: [name] Brown  MRN: [id number] DOB: 1950/09/25  Age: 74 y.o. Sex: male  Race/Ethnicity: White or Caucasian  Years of Education: 16 Handedness: Right  Referring Provider:  Curry Leni DASEN, MD Provider/Clinical Neuropsychologist: Evalene DOROTHA Riff, PsyD Date of Service: 07/24/2024 Start Time: 10 AM End Time: 11 AM Location of Service:  [facility] Physical Medicine & Rehabilitation Department [address] [place] [place], [place] [address]  Billing Code/Service: [phone]  Individuals Present: Evalene Riff, PsyD 1 hour was spent on interpretation of patient data, interpretation of standardized test results and clinical data, clinical decision making, initial treatment planning/recommendations, and report writing. The report will be amended as needed based on any additional information collected during interactive feedback session.   REASON FOR REFERRAL The patient was referred for neuropsychological evaluation for concerns of underlying Autism Spectrum Disorder (ASD) and Attention-Deficit/Hyperactivity Disorder (ADHD). The patient is a 74 year old man currently under care of of psychiatry, initially referred there by psychologist Dr. Corina who met with the patient for neuropsychological consultation within the context of comprehensive inpatient rehabilitation for recovery from right femur fracture in 11/03/2023.  Per Dr. Darden note, the patient reported a history of anxiety and depression type symptoms, obsessive-compulsive disorder, suspicion of mild ASD and difficulties with motivation. Referring provider records indicate possible ADHD diagnosis as well, but need for formal psychological testing to confirm.   BACKGROUND & PRESENTING CONCERNS:  Family & Developmental History: The patient indicated he was not aware of any complications during pregnancy or issues with  developmental milestones.The patient was born and raised by his biological parents in [place] . He lived there until the age of 60 when his family moved to [place]. He is the eldest of three siblings. He indicated his environment growing up was adequate in terms of meeting physical needs, but emotional support and structure limited.  He reported having some suspicion of ASD in the family, but no formal diagnosis have been made.    Educational / Vocational History: His grades were okay in early grade school, although did have some social difficulties and indications of problems with organization. The patient reported finding old report cards and communications between his mother and teacher that included comments that he seemed to have no concept of time and missed deadlines seemingly without awareness. He described having limited social relationships, spending free-time alone. The patient denied any indications of specific learning disorders, but endorsed difficulties with attention and concentration and in the 5th grade I stopped doing homework. I didn't see the point. He denied any significant environmental changes or precipitating events. The patient indicated that he moved to [place] in the 8th grade, and that this was a challenging transition. The school was much larger and he struggled to integrate. He experienced bullying in the first two years and had minimal social connections. He described ongoing difficulties with attention and concentration, and avoidance of homework. He enjoyed reading and read well above grade level. He indicated that starting around his teenage years he realized he wasn't achieving like everyone else. He had to repeat the 11th, which he attributes to not doing the work. He described difficulty with motivation, particularly when not seeing a practical application for certain studies.   He graduated high school, worked for one year before attending  [employer] for one semester. He struggled, possible due to never having learned study skills. 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 He indicated working with tools and wood was an area of strength. He also described long-running fascination with wood. He later applied to go back to school but wasn't accepted. He spent time with a cousin who he learned study habits from and completed a year at [employer] where he earned a 4.0. 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 He then applied to pharmacy school and was admitted to [employer] where he graduated with a B.S.   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 He did not enjoy the retail aspect of the work, and gravitated towards the more technical and computing/systems aspects of the work. For a period, he indicated he worked night shifts. He indicated he preferred to work night shifts as I didn't have to deal with as many people. He struggled with certain aspects of pharmacy work, particularly those he found less interesting, but had an intense interest in the infrastructure aspect of the work, warden/ranger, and excelled. 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 He traveled to different locations and provided support and training for implementation of such systems. He described having difficulties with certain aspects of pharmacy work, others tending to out perform him for things like number of orders processed. He indicated that his productivity and career progression were likely limited by difficulties with motivation and poor engagement when tasks didn't interest him. That being said, his skills were recognized and promoted on several occasions.    Psychiatric History: The patient described experiencing trauma linked to the two years of bulling he experienced when moving to [place]. He denied any clear indications of PTSD symptomology at present. He indicated that he began to experience some difficulties with worry/anxiety around his teenage years that he linked to realization that he was not performing as well as his peers. He indicated that he believes he struggled with depression throughout his life, recalling indications dating back to the first grade. He was sent to a psychiatrist when in high school by his parents because they didn't know what to do with me but did not find it helpful. He had no other interactions with mental health treatment until recently.   Autism Spectrum Disorder (ASD) Symptomatology: Information was obtained through detailed clinical interview. The patient expressed reservations regarding his spouse being contacted for collateral interview and explained the rationale for this, and the provider expressed understanding and indicated his spouse would not be contacted. The patient's descriptions indicate life-long deficits in social communication and social interactions and indications of slightly restrictive interests, activities, and behavior patterns to a lesser degree. Symptoms have been stable and trait-like rather than situationally bound or variable such that they may be secondary to other pathology.  The patient described indications, directly and indirectly, of difficulties in social-emotional reciprocity, nonverbal communication (expressive and receptive), difficulties adjusting/adapting to align with normative behavior in social contexts. He reported significant difficulty with judgment of the emotional state of others based on nonverbal behaviors/facial expression. He reported recognizing that he may be different from others in that I don't feel other people's moods, and that he doesn't do empathy well. He  indicated that he struggles in social settings and has had to learn or train himself with  respect to social behavior (I.e., maintaining eye contact). He reported ongoing difficulties with judgment about engaging with others socially. Behavioral observations during the clinical interview were consistent indications of deficits in expressive nonverbal communications. For example, use of eye contact was atypical and appeared more determine by choice versus typical conversational rhythm. The patient's facial expression and affect in general was flat, but not due to depression. Body language was similarly restricted. The patient described significant and life-long difficulties with social engagements, maintaining social relationships, adjusting to different social contexts. He described predominantly solitary activities for leisure, and primary interest focused on science and technical work. He described a lifelong fascination with different woods. He described some possible indications of sensory sensitivity/hyperreactivity with aversion to certain sounds. Symptoms have negatively impacted multiple areas of life functioning, most prominently social and occupational.   Attention-Deficit/Hyperactivity Disorder (ADHD) Symptomatology:  Note: Endorsements were based on patient self report during interview. Diagnostic clinical interview utilized questions and examples and overall structure from DIVA-5, and combined with broader diagnostic clinical interviewing for symptom validation and differential Dx.   Attention-Deficit Symptoms: Often difficulties with.. Current Childhood  A1a Attention to detail/careless mistakes:  Yes Yes  A1b Sustained attention:  Yes Yes  A1c Attending when addressed directly:  Yes Yes  A1d Follow through on instructions / tasks:   Yes Yes  A1e Organizational difficulty:  Yes Yes  A9f Aversion / avoidance of sustained mental effort:  Yes Yes  A1g Losing things necessary for  tasks/activities:  Yes Yes  A1h Easily distracted by extraneous stimuli:  Yes Yes  A1i Forgetful in daily activities:  Yes Yes  Hyperactivity/Impulsivity Symptoms: Often difficulties with    A2a Fidgeting/squirming in seat: Yes Yes  A2b Remaining seated / needing to move around: No No  A2c Restlessness/runs or climbs when inappropriate:  No No  A2d Engaging in leisure activities quietly: ** No No  A2e Excessive energy:  No No  A4f Talking excessively:  No No  A2g Blurting out answers / interrupting others:  No No  A2h Waiting turn:  No No  A2i Interruption or socially intrusive: No No   Symptoms indicated as currently present (adult): Present at least 6 months but also appear chronic/trait-like. Symptoms negatively impact functioning. Symptoms are not better explained by another mental disorder. Symptoms indicated as present during childhood: Present by 74 years of age, are inconsistent with developmental level, negatively impact functioning, and are not better explained by another mental disorder.   Functional Impact: Academic and professional achievement negatively impacted. Difficulties with efficiency and productivity at home.    Recent Cognitive Changes:  Memory: None  Processing Speed: None  Attention & Concentration: None  Language: None  Visual-Spatial: None  Executive Functioning: None  Motor/Sensory Functioning:  Sensory changes: Nearsighted and other known conditions negatively impacting vision. Wears glasses. Some declines in hearing.  Balance/coordination difficulties:None Frequent instances of dizziness/vertigo: Occasional and of unclear etiology. Not postural. Other motor difficulties: Essential tremors.  Emotional and Behavioral Functioning:  History of depression and anxiety which is currently treated via medication. He established care with psychiatry following recommendations from inpatient neuropsychology this year.  He reported that he had difficulties with  depression from an early age. He indicated that this may have been related to limited support in his home environment growing up.  Difficulties with anxiety emerged during the patient's teenage years.  He indicated he realized he was not performing in line with his peers and was also recognizing differences in himself socially relative  to others.   He reported struggling to remember people's faces as long ago as grade school. He indicated this created additional challenges in social interactions.  Currently, patient indicated feeling that he had mild depression, difficulties with motivation, slight reduction or variability in enjoyment of pleasurable activities. Energy is slightly low as well.  Denies any problems with excessive worry at present. No panic attacks. Stress is reportedly minimal.  No current or past difficulties with hallucination, paranoia, symptoms of mania, suicidal ideation, homicidal ideation. Denies any active PTSD S/Sx.   Sleep: No difficulties with sleep onset or maintenance.  Sleeps approximately 8 to 8-1/2 hours per night.  No indications of unusual behavior while asleep. Appetite: Good. Caffeine: Two large coffees per day. Alcohol Use: 2 to 3 glasses of wine per night, for many years.  Tobacco Use: None Recreational Substance Use: None  Other Psychosocial: Marital Status: Married to his wife since 1978, Children/Grandchildren: None Living Situation: Alone with spouse.  Daily Activities/Hobbies: Builds guitars. Playing music. Outdoors. Interested in sciences.   Level of Functional Independence: The patient is intact with basic and instrumental activities of daily living.   Medical History/Record Review: Per records and patient report, History of traumatic brain injury/concussion: No History of stroke: No History of heart attack: No History of cancer/chemotherapy: No History of seizure activity: No Symptoms of chronic pain: No Experience of frequent  headaches/migraines: No Imaging/Lab Results: Per records, 06/18/2018 EXAM: MRI Brain with and without contrast ORDERING CLINICIAN: True Mar, MD, PhD FINDINGS: On sagittal images, the spinal cord is imaged caudally to C2-C3 and is normal in caliber.   The contents of the posterior fossa are of normal size and position.   The pituitary gland and optic chiasm appear normal.    Brain volume appears normal for age.   The ventricles are normal in size for age and without distortion.  There are no abnormal extra-axial collections of fluid.   The cerebellum and brainstem appears normal.   The deep gray matter appears normal.  The cerebral hemispheres appear normal for age.   Diffusion weighted images are normal.  Susceptibility weighted images are normal.    The orbits appear normal.   The VIIth/VIIIth nerve complex appears normal.  The mastoid air cells appear normal.  The paranasal sinuses appear normal.  Flow voids are identified within the major intracerebral arteries.  The left vertebral artery is dominant. After the infusion of contrast material, a normal enhancement pattern is noted. IMPRESSION: This is a normal age-appropriate MRI of the brain with and without contrast.  Past Medical History:  Diagnosis Date   Aortic stenosis    Ascending aortic aneurysm 09/18/2018   Colon cancer [facility])    Family history of adverse reaction to anesthesia    pt states his mother had 2 episodes of hyponatremia following anesthesia    GERD (gastroesophageal reflux disease)    Headache    Heart murmur    Hyperlipidemia 10/25/2016   Hypertension    pt is currently not taking any medications; pt states is borderline   Melanoma (HCC)    OSA on CPAP 09/18/2018   PAF (paroxysmal atrial fibrillation) (HCC) 06/13/2017   Perforated sigmoid colon (HCC)    PVC's (premature ventricular contractions) 10/25/2016   Sleep apnea 2019   on CPAP   Tinnitus    Wears glasses    Patient Active Problem List   Diagnosis  Date Noted   Gastrointestinal problem 04/05/2024   Erectile dysfunction 04/05/2024   DVT (deep venous  thrombosis) (HCC) 12/03/2023   RLS (restless legs syndrome) 11/17/2023   Obsessive compulsive disorder 11/13/2023   Fracture, proximal femur, right, sequela 11/07/2023   Obesity (BMI 30-39.9) 11/02/2023   Dysthymia 11/03/2022   Abnormal findings on diagnostic imaging of other parts of digestive tract 11/04/2021   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   Noninfective gastroenteritis and colitis, unspecified 11/04/2021   History of colonic polyps 11/04/2021   Screening for intestinal cancer 11/04/2021   Slow transit constipation 11/04/2021   SBO (small bowel obstruction) (HCC) 01/04/2021   Alcohol use 01/04/2021   Macular edema 10/22/2020   Hypothyroid 09/24/2018   OSA on CPAP 09/18/2018   S/P AVR (aortic valve replacement) 06/08/2017   Non-ischemic cardiomyopathy (HCC)    Aortic stenosis with bicuspid valve 10/25/2016   PVC's (premature ventricular contractions) 10/25/2016   Hyperlipidemia 10/25/2016   Coronary artery disease involving native coronary artery of native heart without angina pectoris 09/23/2016   History of colon cancer, stage I 08/18/2016   Essential hypertension    Perforated sigmoid colon s/p colectomy/colostomy 04/26/2016 04/25/2016   Family Neurologic/Medical Hx:  Family History  Problem Relation Age of Onset   Dementia Mother    Kidney disease Mother    Vascular Disease Mother    CAD Mother    Heart attack Father    Alcohol abuse Father    Cirrhosis Father    Stroke Maternal Grandmother    Stroke Paternal Grandmother    Alcohol abuse Brother    Sleep apnea Neg Hx    Medications:  aspirin  81 MG chewable tablet buPROPion  (WELLBUTRIN  XL) 300 MG 24 hr tablet diltiazem  (CARDIZEM  CD) 120 MG 24 hr capsule ezetimibe  (ZETIA ) 10 MG tablet famotidine  (PEPCID ) 20 MG tablet levothyroxine  (SYNTHROID ) 100 MCG tablet losartan  (COZAAR ) 100 MG tablet  (Expired) Multiple Vitamin (MULTIVITAMIN WITH MINERALS) TABS tablet pravastatin  (PRAVACHOL ) 20 MG tablet rOPINIRole  (REQUIP ) 0.25 MG tablet sildenafil  (VIAGRA ) 100 MG tablet XIIDRA 5 % SOLN  NEUROPSYCHOLOGICAL TEST RESULTS   Mental Status/Behavioral Observations (06/27/24 - Intake):  Sensorium/Arousal: Alert.  Hearing and vision are adequate for the purpose of the visit and the demands of future testing. Orientation: Full. Appearance: Appropriate dress and hygiene. Behavior: Cooperative and engaged. Speech/Language: Conversational speech was fluent and well-articulated. Speech was minimally prosodic. No difficulties in receptive language inferred based on responses to questions.  Motor: Ambulated independently without issue.  No tremor. Social Comportment: Patient behavior was polite and appropriate/well controlled. Patient social behavior was atypical with respect to nonverbal communication and affective expression. Nonverbal behavior showed minimal affective expression, although he was in no way unpleasant. Speech quality was similar. Eye contact varied, and aligned with his description of it as a practiced behavior rather than unconscious act in conversation.  Mood: Neutral Affect: Congruent Thought Process/Content: Coherent, linear and goal directed. Ability to Participate in Interview: Patient readily answered all questions with considerable detail regarding personal history. Insight: Good. Mental Status/Behavioral Observations (07/18/2024):  Sensory/Arousal: Hearing and vision were adequate for testing. The patient was alert. Appearance: Dress and hygiene were appropriate for the setting.   Speech/Language: In conversation, the patient's speech was prosodic, fluent, and well-articulated. The patient displayed no indications of word finding difficulties and no word substitution errors were observed.  Motor: The patient ambulated independently and without issue. A slight tremor was  observed on the patient's right hand.  Social Comportment: Appropriate to the setting. Mood/Affect: Mood was largely neutral. Affect was consistent with mood.  Attention/Concentration: The patient appeared to maintain consistent engagement  throughout the testing session. No frank attentional lapses were observed.  Thought Process/Content: The patient's thought process was coherent, linear, goal directed. There were no indications of psychosis.  Additional Observations: The patient showed no difficulties with understanding task instructions. No difficulties with frustration tolerance were noted. Tests Administered: Conners Continuous Performance Test 3rd Edition (CPT-3) Delis-Kaplan Executive Function System (D-KEFS), select subtests Trail Making Test (TMT; Part A & B) Wechsler Adult Intelligence Scale-Fourth Edition (WAIS-IV), select subtests Wechsler Abbreviated Scale of Intelligence CIVIL SERVICE FAST STREAMER) Wechsler Memory Scale-Third Edition (WMS-III), select subtests  [place]  Card Sorting Test [phone]) Autism Spectrum Quotient (AQ) Empathy Quotient-- 40 Item Version (EQ-40)  Executive Skills Questionnaire Geriatric Depression Scale-Short Form (GDS-SF) Geriatric Anxiety Inventory (GAI)  Performance Validity: Embedded performance validity metrics were all within normal limits, combined with behavioral observations, support the interpretation of test data as a reliable and valid estimate of the patient's cognitive functioning.  Measurement properties of test scores: IQ, Index, and Standard Scores (SS): Mean = 100; Standard Deviation = 15; Scaled Scores (ss): Mean = 10; Standard Deviation = 3; Z scores (Z): Mean = 0; Standard Deviation = 1; T scores (T); Mean = 50; Standard Deviation = 10 INTELLECTUAL/PREMORBID FUNCTIONING ESTIMATE   Norm Score Percentile  Range  WASI-II FSIQ-4  SS = 124 95 %ile Above Average   Vocabulary   t = 75 99 %ile Exceptionally High   Matrix Reasoning   t = 61 86 %ile High  Average   Block Design  t = 50 50 %ile Average   Similarities  t = 65 94 %ile Above Average  The patient completed an abbreviated measure of overall intellectual ability.  Full-scale composite score was within the above average range by age.  Patient demonstrated relative strength in verbal abilities scoring within the exceptionally high score range for vocabulary and within the above average score range on a measure of abstract verbal reasoning.  The patient's performance on nonverbal measures showed a high average performance on an abstract nonverbal reasoning task.  His performance on a visual construction task requiring him to replicate two-dimensional figures utilizing three-dimensional blocks under timed conditions was average.  ATTENTION AND WORKING MEMORY    Norm Score Percentile  Range  WAIS-IV          Digit Span  ss = 11 63 %ile Average   DSF (Forward)  ss = 17 99 %ile Exceptionally High   Max Span:    9      DSB (Backward)  ss = 8 25 %ile Average   Max Span:    3      DSS (Sequencing)  ss = 9 37 %ile Average   Max Span:    4     WMS-III          Spatial Span  ss = 9 37 %ile Average   SSF (Forward)  ss = 8 25 %ile Average   Max Span:    5      SSB (Backward)  ss = 10 50 %ile Average   Max Span:    4     The patient completed two measures of basic attention.  On the auditory-verbal attention test his overall performance was scored within the average range although there was notable variability between subtests.  On the first subtest, one assessing basic span, he scored within the exceptionally high score range.  The two other subtests involved greater demands on working memory and mental manipulation, and then these he scored solidly within the  average range but considerably lower than his basic span performance (>2 SD) suggesting relative weakness and working memory.  His performance on a measure of visual attention and working memory was average overall and consistent between  subtests.  (The two subtests on this measure do not vary with respect to working memory demands).  COMPLEX ATTENTION    Norm Score Percentile  Descriptor  CPT-3          Response Style       Balanced   Detectability (reverse scored)  t = 60 82 %ile Elevated   Omission Errors  t = 52 74 %ile Average   Commission Errors  t = 57 78 %ile High average   Perseverations  t = 59 89 %ile High average   HRT (reaction time)  t = 42 18 %ile Slightly fast   HRT SD (reaction time consistency) t = 61 89 %ile Elevated   Variability (in RT consistency)  t = 50 65 %ile Average   HRT Block Change (over test)  t = 74 98 %ile Very elevated   HRT ISI Change (by stimuli interval) t = 64 91 %ile Elevated  Patient completed a continuous performance task assessing aspects of complex attention and for indications of impulsivity.  He engaged with the task as instructed, balancing speed and accuracy.  His score profile demonstrated slight difficulties with basic and attention, sustained attention, and vigilance related attention difficulties.  Metrics of impulsivity were technically within normal limits although approaching significance.  PROCESSING SPEED    Norm Score Percentile  Range  WAIS-IV          Coding  ss = 10 50 %ile Average   Symbol Search  ss = 11 63 %ile Average  Processing speed, as measured by rapid digit symbol translation, was scored in the average range by age.  He also scored within the average range on another processing speed measure that slightly reduced motor demands and involved rapid discrimination of novel geometric figures.  EXECUTIVE FUNCTIONING    Norm Score Percentile  Range  DKEFS - Color-Word Interference          Color Naming  ss = 10 50 %ile Average   Word Reading  ss = 12 75 %ile High Average   Inhibition  ss = 13 84 %ile High Average   Errors  ss = 13 84 %ile High Average   Inhibition Switching  ss = 12 75 %ile High Average   Errors  ss = 9 37 %ile Average  Trails A  t = 34 5  %ile Below Average  Trails B  t = 46 32 %ile Average  [place]  Card Sorting Test (WCST)          Total Errors  t = 53 63 %ile Average   Perseverative Errors  t = 54 66 %ile Average   Non-Perseverative Errors  t = 49 45 %ile Average   % Conceptual Responses  t = 59 82 %ile High Average   Categories Completed     >16 %ile WNL   Trials to First Category Complete     >16 %ile WNL   Failure to Maintain Set     >16 %ile WNL  The patient's baseline performances showed average rapid color naming and high average word reading speeds.  The speed and accuracy and a basic response inhibition task were high average by age.  When response inhibition was combines with set shifting it slightly more difficulty with accuracy, albeit  still scored within the average range, although his overall speed remained high average.    An isolated low performance with an executive functioning involved his visual scanning/psychomotor number sequencing speeded with which he scored in the below average range by age and education.  However, his performance was average when a set shifting element was added.  Patient completed an applied reasoning measure that involved problem-solving, abstract concept formation, and cognitive flexibility. He had no difficulites on this task, correctly identifying the initially based ruleset within a typical number of trials, was able to adapt to unexpected set-changes, and progress through the task reliably.   PERSONALITY AND BEHAVIORAL FUNCTIONING      Score/Interpretation  GDS-SF Raw       8  GDS-SF Severity       Mild.  GAI Raw       4  GAI Severity       Minimal.  Autism Quotient (AQ)       36  Empathy Quotient (EQ-40)       20  Executive Skills Questionnaire (ESQ-R)       54  On self-report measures the patient scored within the mild severity range on a rating scale of symptoms of depression.  His endorsements on self-report for symptoms of anxiety generated a score in the subclinical  range.  Autism Spectrum Quotient (AQ) questionnaire endorsements by the patient were consistent with the presence of ASD and total score was above conservative cutoff scores.  Scores on a measure assessing various aspects of empathy (cognitive/affective/behavioral) generated a total score within the low end of the distribution (5th percentile) consistent with ASD.  Public House Manager (ESQ-R): The patient's total score on a rating scale assessing difficulties with executive functioning was in the exceptionally high score range relative to normative sample of working adults.  Greatest level of difficulty was endorsed within planning, organization, and time management.  Moderate difficulties were identified with emotional and behavioral regulation.   SUMMARY / CLINICAL IMPRESSIONS The patient was referred for neuropsychological evaluation for concerns of underlying Autism Spectrum Disorder (ASD) and Attention-Deficit/Hyperactivity Disorder (ADHD). The patient is a 74 year old man currently under care of of psychiatry, initially referred there by psychologist Dr. Corina who met with the patient for neuropsychological consultation within the context of comprehensive inpatient rehabilitation for recovery from right femur fracture in 11/03/2023.  Per Dr. Darden note, the patient reported a history of anxiety and depression type symptoms, obsessive-compulsive disorder, suspicion of mild ASD and difficulties with motivation. Referring provider records indicate possible ADHD diagnosis as well, but need for formal psychological testing to confirm.   Upon interview, the patient indicated he was interested in undergoing the evaluation to clarify whether his suspicions for having ASD and ADHD are correct, as it would help explain life-long tendencies and challenges he has experienced. Patient history and current functioning showed presence of symptoms which appear life-long, trait-like, and  overall pattern consistent with a neurodevelopmental disorder versus symptoms secondary to other psychopathology or contingent upon environmental / situational factors. The patient's descriptions of various aspects of his history, current difficulties, and behavioral presentation is consistent with comorbid autism spectrum disorder (ASD) and attention-deficit/hyperactivity disorder.   The patient's cognitive test profile / score profile shows alignment with both attention-deficit/hyperactivity disorder and ASD. Test data also definitively meets / confirms ASD criteria E, ruling out intellectual developmental disorder / global developmental delay as a potential contributor to ASD symptomatology (as expected based on history). Individuals with high functioning ASD are heterogenous with  respect to cognitive profiles as some individuals may show mild impairments within the context of largely average scores, while others may show normatively atypical high, isolated performances. The patient's cognitive profile shows the latter. An abbreviated IQ measure showed above average intellectual abilities, but this reflected a combination of very strong verbal performances with much more typical scores in visual-spatial/non-verbal measures (e.g., average-high average). The unusually strong verbal abilities and crystallized word knowledge performance in particular is common in this sub-grouping of ASD individuals. There were relative weaknesses in executive functioning, working memory, and even some atypical score variability (below average Trails A) that all align with commonly seen areas of weakness in ASD and ADHD. The continuous performance test showed additional evidence of difficulties in complex attention including problems with sustained attention and vigilance. When combined with diagnostic clinical interview and additional data obtained through self-report rating scales, the evidence supports diagnosis of both  conditions.    With formal diagnosis, an attention-deficit/hyperactivity disorder, predominantly inattentive type, is justified. The presence of vigilance related deficits is highlighted as stimulant medication treatment for ADHD tends to be most effective for that deficit type. An autism spectrum disorder diagnosis, without accompanying intellectual impairment, is indicated. The patient is very intelligent and high functioning. In prior iterations of the DSM, a Asperger's disorder would have been made. No diagnostic specifiers regarding support requirements are made as doing so would misrepresent the patient's independent functioning. This is not to say that difficulties related to ASD have not had significant impacts on functioning, they clearly have, but rather than the symptoms do not fundamentally prevent the patient from meeting basic / instrumental activities of daily living or from navigating the world in a basic sense.    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  The patient's cognitive profile shows difficulties in complex attention involving (but not limited to) vigilance. Stimulant medication options may be beneficial to the patient as individuals with this deficit type have been found to respond well to stimulant formulations. I ultimately defer all medication decision to the patient and prescribing physician as medication decisions are beyond the scope of my expertise.   Management of mood and anxiety are important for both overall wellness and cognitive functioning. As noted, difficulties with mental health involve primary mood and are mild. I defer to the patient and his mental health treatment team with respect to intervention need and form regarding  patient endorsed difficulties with depression. I encourage the patient to continue to engage with mental health treatment as needed, particularly if there are any declines in the future. Depression is often under-appreciated / under-recognized in older adults. There do not appear to be significant problems with anxiety, so exacerbation due to ADHD medication treatment is less of a concern. Being aware of side effects and monitoring is always encouraged, however.  Concerns for cognitive change increase in older age, and I provide the following recommendations as general suggestions rather than due to any concerns for cognitive decline: Staying physically active ideally with physical exercise (but only if appropriate/safe and with physician supervision and approval) can be beneficial for quality of life, physical functioning, and cardiovascular health.  Cardiovascular health and management of other chronic medical conditions is recommended. Examples include high cholesterol, hypertension, diabetes, obstructive sleep apnea etc. Management is important overall but even more so for individuals with cerebrovascular disease or other  cardiovascular health conditions. Follow your doctors recommendations, take medication as prescribed, eat healthy, exercise, etc.  Dietary interventions, particularly the Mediterranean or MIND diet emphasizing green leafy vegetables, nuts, berries, and fish, are recommended. An internet search for MIND diet will provide many resources with additional information if interested.  Cognitive stimulation/stay active! Cognitive stimulation is also beneficial for cognitive health and reducing risk of cognitive decline. Cognitively stimulating activities are at their core activities which require your active participation, concentration, and cognitive engagement. Cognitive stimulation can take many forms. Reading (as you are doing now), puzzles, and even some activities of daily living can  apply. It doesn't need to be a formal exercise. Activities can be, and ideally are, things you enjoy. Find what works for you. Please reach out to me with any questions that might emerge upon receipt of this report.     Sincerely,  Evalene DOROTHA Riff, PsyD Clinical Neuropsychology [address] [place], [place] [address] Main: [phone] Fax: [phone]  This report was generated using voice recognition software. While this document has been carefully reviewed, transcription errors may be present. I apologize in advance for any inconvenience. Please contact me if further clarification is needed.  "

## 2024-10-05 NOTE — Telephone Encounter (Signed)
 Dr. Hayden has spoke with Alm Harris today over the phone.

## 2024-10-07 ENCOUNTER — Telehealth (HOSPITAL_COMMUNITY): Admitting: Psychiatry

## 2024-10-11 ENCOUNTER — Encounter (HOSPITAL_COMMUNITY): Payer: Self-pay | Admitting: Psychiatry

## 2024-10-11 ENCOUNTER — Telehealth (HOSPITAL_COMMUNITY): Admitting: Psychiatry

## 2024-10-11 VITALS — Wt 196.0 lb

## 2024-10-11 DIAGNOSIS — F840 Autistic disorder: Secondary | ICD-10-CM | POA: Diagnosis not present

## 2024-10-11 DIAGNOSIS — F419 Anxiety disorder, unspecified: Secondary | ICD-10-CM

## 2024-10-11 DIAGNOSIS — F900 Attention-deficit hyperactivity disorder, predominantly inattentive type: Secondary | ICD-10-CM

## 2024-10-11 MED ORDER — BUPROPION HCL ER (XL) 150 MG PO TB24: 150.0000 mg | ORAL_TABLET | Freq: Every day | ORAL | 0 refills | Status: AC

## 2024-10-11 MED ORDER — LISDEXAMFETAMINE DIMESYLATE 20 MG PO CAPS: 20.0000 mg | ORAL_CAPSULE | Freq: Every day | ORAL | 0 refills | Status: AC

## 2024-11-08 ENCOUNTER — Telehealth (HOSPITAL_COMMUNITY): Admitting: Psychiatry

## 2024-12-17 ENCOUNTER — Encounter

## 2025-06-05 ENCOUNTER — Ambulatory Visit: Admitting: Family Medicine

## 2025-06-09 ENCOUNTER — Ambulatory Visit: Admitting: Family Medicine

## 2025-07-14 ENCOUNTER — Other Ambulatory Visit (HOSPITAL_BASED_OUTPATIENT_CLINIC_OR_DEPARTMENT_OTHER)
